# Patient Record
Sex: Female | Born: 1940 | Race: White | Hispanic: No | Smoking: Former smoker
Health system: Southern US, Community
[De-identification: ages and names within clinical notes are randomized; demographics above are authoritative.]

## PROBLEM LIST (undated history)

## (undated) DIAGNOSIS — E039 Hypothyroidism, unspecified: Secondary | ICD-10-CM

## (undated) DIAGNOSIS — F32A Depression, unspecified: Secondary | ICD-10-CM

## (undated) DIAGNOSIS — Z9981 Dependence on supplemental oxygen: Secondary | ICD-10-CM

## (undated) DIAGNOSIS — I428 Other cardiomyopathies: Secondary | ICD-10-CM

## (undated) DIAGNOSIS — I4891 Unspecified atrial fibrillation: Secondary | ICD-10-CM

## (undated) DIAGNOSIS — J449 Chronic obstructive pulmonary disease, unspecified: Secondary | ICD-10-CM

## (undated) DIAGNOSIS — E119 Type 2 diabetes mellitus without complications: Secondary | ICD-10-CM

## (undated) DIAGNOSIS — I509 Heart failure, unspecified: Secondary | ICD-10-CM

## (undated) DIAGNOSIS — IMO0001 Reserved for inherently not codable concepts without codable children: Secondary | ICD-10-CM

## (undated) DIAGNOSIS — I5032 Chronic diastolic (congestive) heart failure: Secondary | ICD-10-CM

## (undated) DIAGNOSIS — K5090 Crohn's disease, unspecified, without complications: Secondary | ICD-10-CM

## (undated) DIAGNOSIS — F329 Major depressive disorder, single episode, unspecified: Secondary | ICD-10-CM

## (undated) DIAGNOSIS — J302 Other seasonal allergic rhinitis: Secondary | ICD-10-CM

## (undated) DIAGNOSIS — I739 Peripheral vascular disease, unspecified: Secondary | ICD-10-CM

## (undated) DIAGNOSIS — I2510 Atherosclerotic heart disease of native coronary artery without angina pectoris: Secondary | ICD-10-CM

## (undated) DIAGNOSIS — Z950 Presence of cardiac pacemaker: Secondary | ICD-10-CM

## (undated) DIAGNOSIS — M109 Gout, unspecified: Secondary | ICD-10-CM

## (undated) DIAGNOSIS — G4733 Obstructive sleep apnea (adult) (pediatric): Secondary | ICD-10-CM

## (undated) DIAGNOSIS — I495 Sick sinus syndrome: Secondary | ICD-10-CM

## (undated) DIAGNOSIS — Z6841 Body Mass Index (BMI) 40.0 and over, adult: Secondary | ICD-10-CM

## (undated) DIAGNOSIS — K219 Gastro-esophageal reflux disease without esophagitis: Secondary | ICD-10-CM

## (undated) DIAGNOSIS — E785 Hyperlipidemia, unspecified: Secondary | ICD-10-CM

## (undated) DIAGNOSIS — I499 Cardiac arrhythmia, unspecified: Secondary | ICD-10-CM

## (undated) HISTORY — DX: Unspecified atrial fibrillation: I48.91

## (undated) HISTORY — PX: CATARACT EXTRACTION W/ INTRAOCULAR LENS  IMPLANT, BILATERAL: SHX1307

## (undated) HISTORY — DX: Body Mass Index (BMI) 40.0 and over, adult: Z684

## (undated) HISTORY — DX: Chronic obstructive pulmonary disease, unspecified: J44.9

## (undated) HISTORY — DX: Hypothyroidism, unspecified: E03.9

## (undated) HISTORY — DX: Other seasonal allergic rhinitis: J30.2

## (undated) HISTORY — DX: Morbid (severe) obesity due to excess calories: E66.01

## (undated) HISTORY — PX: TONSILLECTOMY: SUR1361

## (undated) HISTORY — DX: Obstructive sleep apnea (adult) (pediatric): G47.33

## (undated) HISTORY — DX: Sick sinus syndrome: I49.5

## (undated) HISTORY — DX: Gastro-esophageal reflux disease without esophagitis: K21.9

## (undated) HISTORY — DX: Hyperlipidemia, unspecified: E78.5

## (undated) HISTORY — DX: Type 2 diabetes mellitus without complications: E11.9

## (undated) HISTORY — DX: Major depressive disorder, single episode, unspecified: F32.9

## (undated) HISTORY — DX: Heart failure, unspecified: I50.9

## (undated) HISTORY — DX: Atherosclerotic heart disease of native coronary artery without angina pectoris: I25.10

## (undated) HISTORY — DX: Crohn's disease, unspecified, without complications: K50.90

## (undated) HISTORY — DX: Depression, unspecified: F32.A

## (undated) HISTORY — PX: OTHER SURGICAL HISTORY: SHX169

## (undated) HISTORY — DX: Other cardiomyopathies: I42.8

## (undated) HISTORY — DX: Presence of cardiac pacemaker: Z95.0

## (undated) HISTORY — DX: Chronic diastolic (congestive) heart failure: I50.32

---

## 1999-01-30 ENCOUNTER — Ambulatory Visit (HOSPITAL_COMMUNITY): Admission: RE | Admit: 1999-01-30 | Discharge: 1999-01-30 | Payer: Self-pay | Admitting: Gastroenterology

## 2000-04-30 ENCOUNTER — Ambulatory Visit (HOSPITAL_COMMUNITY): Admission: RE | Admit: 2000-04-30 | Discharge: 2000-04-30 | Payer: Self-pay | Admitting: *Deleted

## 2000-07-14 ENCOUNTER — Inpatient Hospital Stay (HOSPITAL_COMMUNITY): Admission: AD | Admit: 2000-07-14 | Discharge: 2000-07-17 | Payer: Self-pay | Admitting: *Deleted

## 2000-08-04 ENCOUNTER — Inpatient Hospital Stay (HOSPITAL_COMMUNITY): Admission: AD | Admit: 2000-08-04 | Discharge: 2000-08-06 | Payer: Self-pay | Admitting: *Deleted

## 2000-08-19 ENCOUNTER — Ambulatory Visit (HOSPITAL_COMMUNITY): Admission: RE | Admit: 2000-08-19 | Discharge: 2000-08-19 | Payer: Self-pay | Admitting: *Deleted

## 2000-08-30 HISTORY — PX: CARDIAC CATHETERIZATION: SHX172

## 2000-09-12 ENCOUNTER — Ambulatory Visit (HOSPITAL_COMMUNITY): Admission: RE | Admit: 2000-09-12 | Discharge: 2000-09-12 | Payer: Self-pay | Admitting: Cardiology

## 2000-09-12 ENCOUNTER — Encounter: Payer: Self-pay | Admitting: Cardiology

## 2000-09-15 ENCOUNTER — Ambulatory Visit (HOSPITAL_COMMUNITY): Admission: RE | Admit: 2000-09-15 | Discharge: 2000-09-17 | Payer: Self-pay | Admitting: Cardiology

## 2000-09-16 ENCOUNTER — Encounter: Payer: Self-pay | Admitting: Cardiology

## 2000-09-17 ENCOUNTER — Encounter: Payer: Self-pay | Admitting: Cardiology

## 2000-10-14 ENCOUNTER — Emergency Department (HOSPITAL_COMMUNITY): Admission: EM | Admit: 2000-10-14 | Discharge: 2000-10-14 | Payer: Self-pay | Admitting: Emergency Medicine

## 2001-01-29 ENCOUNTER — Ambulatory Visit: Admission: RE | Admit: 2001-01-29 | Discharge: 2001-01-29 | Payer: Self-pay | Admitting: Pulmonary Disease

## 2002-03-27 ENCOUNTER — Encounter: Payer: Self-pay | Admitting: Orthopedic Surgery

## 2002-03-27 ENCOUNTER — Ambulatory Visit (HOSPITAL_COMMUNITY): Admission: RE | Admit: 2002-03-27 | Discharge: 2002-03-27 | Payer: Self-pay | Admitting: Orthopedic Surgery

## 2002-04-12 ENCOUNTER — Encounter (INDEPENDENT_AMBULATORY_CARE_PROVIDER_SITE_OTHER): Payer: Self-pay | Admitting: Specialist

## 2002-04-12 ENCOUNTER — Ambulatory Visit (HOSPITAL_COMMUNITY): Admission: RE | Admit: 2002-04-12 | Discharge: 2002-04-12 | Payer: Self-pay | Admitting: Gastroenterology

## 2003-05-30 ENCOUNTER — Ambulatory Visit (HOSPITAL_COMMUNITY): Admission: RE | Admit: 2003-05-30 | Discharge: 2003-05-30 | Payer: Self-pay | Admitting: *Deleted

## 2003-05-30 ENCOUNTER — Encounter: Payer: Self-pay | Admitting: *Deleted

## 2003-08-07 ENCOUNTER — Inpatient Hospital Stay (HOSPITAL_COMMUNITY): Admission: AD | Admit: 2003-08-07 | Discharge: 2003-08-10 | Payer: Self-pay | Admitting: Internal Medicine

## 2005-02-18 ENCOUNTER — Ambulatory Visit: Payer: Self-pay | Admitting: Internal Medicine

## 2005-05-29 ENCOUNTER — Encounter (INDEPENDENT_AMBULATORY_CARE_PROVIDER_SITE_OTHER): Payer: Self-pay | Admitting: Specialist

## 2005-05-29 ENCOUNTER — Ambulatory Visit (HOSPITAL_COMMUNITY): Admission: RE | Admit: 2005-05-29 | Discharge: 2005-05-29 | Payer: Self-pay | Admitting: Gastroenterology

## 2006-08-06 ENCOUNTER — Ambulatory Visit: Payer: Self-pay | Admitting: Internal Medicine

## 2006-08-07 ENCOUNTER — Ambulatory Visit: Payer: Self-pay

## 2006-09-01 ENCOUNTER — Ambulatory Visit: Payer: Self-pay | Admitting: Internal Medicine

## 2006-09-19 ENCOUNTER — Ambulatory Visit: Payer: Self-pay | Admitting: Cardiovascular Disease

## 2006-09-26 ENCOUNTER — Ambulatory Visit: Payer: Self-pay | Admitting: Cardiology

## 2006-10-03 ENCOUNTER — Ambulatory Visit: Payer: Self-pay | Admitting: Cardiology

## 2006-10-06 ENCOUNTER — Ambulatory Visit: Payer: Self-pay | Admitting: Cardiovascular Disease

## 2006-10-08 ENCOUNTER — Ambulatory Visit: Payer: Self-pay

## 2006-10-08 ENCOUNTER — Ambulatory Visit: Payer: Self-pay | Admitting: Cardiology

## 2006-10-13 ENCOUNTER — Ambulatory Visit: Payer: Self-pay

## 2006-10-30 ENCOUNTER — Ambulatory Visit: Payer: Self-pay | Admitting: Cardiology

## 2006-11-27 ENCOUNTER — Ambulatory Visit: Payer: Self-pay | Admitting: *Deleted

## 2006-12-18 ENCOUNTER — Ambulatory Visit: Payer: Self-pay | Admitting: Cardiology

## 2007-01-15 ENCOUNTER — Ambulatory Visit: Payer: Self-pay | Admitting: Internal Medicine

## 2007-02-05 ENCOUNTER — Ambulatory Visit: Payer: Self-pay | Admitting: Cardiology

## 2007-02-09 ENCOUNTER — Ambulatory Visit: Payer: Self-pay | Admitting: Internal Medicine

## 2007-03-05 ENCOUNTER — Ambulatory Visit: Payer: Self-pay | Admitting: Cardiovascular Disease

## 2007-03-16 ENCOUNTER — Ambulatory Visit: Payer: Self-pay | Admitting: Internal Medicine

## 2007-03-16 LAB — CONVERTED CEMR LAB
Basophils Relative: 0 % (ref 0.0–1.0)
CO2: 30 meq/L (ref 19–32)
Creatinine, Ser: 0.9 mg/dL (ref 0.4–1.2)
Glucose, Bld: 115 mg/dL — ABNORMAL HIGH (ref 70–99)
HCT: 46.9 % — ABNORMAL HIGH (ref 36.0–46.0)
Hemoglobin: 16.1 g/dL — ABNORMAL HIGH (ref 12.0–15.0)
INR: 2.1 — ABNORMAL HIGH (ref 0.9–2.0)
Lymphocytes Relative: 14.9 % (ref 12.0–46.0)
Monocytes Absolute: 0.5 10*3/uL (ref 0.2–0.7)
Neutro Abs: 6.2 10*3/uL (ref 1.4–7.7)
Neutrophils Relative %: 77.7 % — ABNORMAL HIGH (ref 43.0–77.0)
Potassium: 4.2 meq/L (ref 3.5–5.1)
Prothrombin Time: 18.1 s — ABNORMAL HIGH (ref 10.0–14.0)
RDW: 13.6 % (ref 11.5–14.6)
Sodium: 141 meq/L (ref 135–145)
aPTT: 37.6 s — ABNORMAL HIGH (ref 26.5–36.5)

## 2007-03-26 ENCOUNTER — Ambulatory Visit (HOSPITAL_COMMUNITY): Admission: RE | Admit: 2007-03-26 | Discharge: 2007-03-26 | Payer: Self-pay | Admitting: Internal Medicine

## 2007-04-02 ENCOUNTER — Ambulatory Visit: Payer: Self-pay | Admitting: Internal Medicine

## 2007-04-20 ENCOUNTER — Ambulatory Visit: Payer: Self-pay | Admitting: Internal Medicine

## 2007-04-20 LAB — CONVERTED CEMR LAB
ALT: 29 units/L (ref 0–35)
AST: 19 units/L (ref 0–37)
Bilirubin, Direct: 0.1 mg/dL (ref 0.0–0.3)
Total Bilirubin: 0.9 mg/dL (ref 0.3–1.2)
Total Protein: 6.8 g/dL (ref 6.0–8.3)

## 2007-04-23 ENCOUNTER — Ambulatory Visit: Payer: Self-pay | Admitting: Cardiovascular Disease

## 2007-04-23 ENCOUNTER — Ambulatory Visit: Payer: Self-pay | Admitting: Cardiology

## 2007-04-24 ENCOUNTER — Ambulatory Visit: Admission: RE | Admit: 2007-04-24 | Discharge: 2007-04-24 | Payer: Self-pay | Admitting: Internal Medicine

## 2007-04-30 ENCOUNTER — Ambulatory Visit: Payer: Self-pay | Admitting: Cardiology

## 2007-05-07 ENCOUNTER — Ambulatory Visit: Payer: Self-pay | Admitting: Internal Medicine

## 2007-05-07 LAB — CONVERTED CEMR LAB
Basophils Absolute: 0 10*3/uL (ref 0.0–0.1)
Calcium: 10.1 mg/dL (ref 8.4–10.5)
Eosinophils Absolute: 0 10*3/uL (ref 0.0–0.6)
Eosinophils Relative: 0.3 % (ref 0.0–5.0)
GFR calc Af Amer: 71 mL/min
GFR calc non Af Amer: 59 mL/min
Glucose, Bld: 191 mg/dL — ABNORMAL HIGH (ref 70–99)
Lymphocytes Relative: 9.8 % — ABNORMAL LOW (ref 12.0–46.0)
MCHC: 33.9 g/dL (ref 30.0–36.0)
MCV: 96 fL (ref 78.0–100.0)
Neutro Abs: 6.5 10*3/uL (ref 1.4–7.7)
Platelets: 369 10*3/uL (ref 150–400)
Sodium: 139 meq/L (ref 135–145)
WBC: 7.8 10*3/uL (ref 4.5–10.5)

## 2007-05-08 ENCOUNTER — Ambulatory Visit: Payer: Self-pay | Admitting: Cardiology

## 2007-05-08 ENCOUNTER — Ambulatory Visit (HOSPITAL_COMMUNITY): Admission: RE | Admit: 2007-05-08 | Discharge: 2007-05-08 | Payer: Self-pay | Admitting: Internal Medicine

## 2007-05-15 ENCOUNTER — Ambulatory Visit: Payer: Self-pay | Admitting: Cardiology

## 2007-05-25 ENCOUNTER — Ambulatory Visit: Payer: Self-pay | Admitting: Cardiology

## 2007-05-25 ENCOUNTER — Ambulatory Visit: Payer: Self-pay | Admitting: Internal Medicine

## 2007-05-25 ENCOUNTER — Inpatient Hospital Stay (HOSPITAL_COMMUNITY): Admission: EM | Admit: 2007-05-25 | Discharge: 2007-06-06 | Payer: Self-pay | Admitting: Emergency Medicine

## 2007-05-26 ENCOUNTER — Encounter (INDEPENDENT_AMBULATORY_CARE_PROVIDER_SITE_OTHER): Payer: Self-pay | Admitting: Internal Medicine

## 2007-05-29 HISTORY — PX: CARDIAC CATHETERIZATION: SHX172

## 2007-06-08 ENCOUNTER — Ambulatory Visit: Payer: Self-pay | Admitting: Internal Medicine

## 2007-06-08 LAB — CONVERTED CEMR LAB
BUN: 57 mg/dL — ABNORMAL HIGH (ref 6–23)
CO2: 27 meq/L (ref 19–32)
GFR calc Af Amer: 34 mL/min
Glucose, Bld: 169 mg/dL — ABNORMAL HIGH (ref 70–99)
Potassium: 4.8 meq/L (ref 3.5–5.1)

## 2007-06-15 ENCOUNTER — Ambulatory Visit: Payer: Self-pay | Admitting: Internal Medicine

## 2007-06-15 LAB — CONVERTED CEMR LAB
GFR calc Af Amer: 53 mL/min
GFR calc non Af Amer: 44 mL/min
Glucose, Bld: 147 mg/dL — ABNORMAL HIGH (ref 70–99)
Sodium: 140 meq/L (ref 135–145)

## 2007-06-22 ENCOUNTER — Ambulatory Visit (HOSPITAL_BASED_OUTPATIENT_CLINIC_OR_DEPARTMENT_OTHER): Admission: RE | Admit: 2007-06-22 | Discharge: 2007-06-22 | Payer: Self-pay | Admitting: Internal Medicine

## 2007-06-29 ENCOUNTER — Ambulatory Visit: Payer: Self-pay | Admitting: Cardiovascular Disease

## 2007-07-08 ENCOUNTER — Ambulatory Visit: Payer: Self-pay | Admitting: Pulmonary Disease

## 2007-07-13 ENCOUNTER — Ambulatory Visit: Payer: Self-pay | Admitting: Internal Medicine

## 2007-07-13 LAB — CONVERTED CEMR LAB
Basophils Absolute: 0.1 10*3/uL (ref 0.0–0.1)
Calcium: 10.7 mg/dL — ABNORMAL HIGH (ref 8.4–10.5)
Chloride: 108 meq/L (ref 96–112)
Eosinophils Absolute: 0.1 10*3/uL (ref 0.0–0.6)
GFR calc non Af Amer: 59 mL/min
MCHC: 34.9 g/dL (ref 30.0–36.0)
MCV: 92.4 fL (ref 78.0–100.0)
Platelets: 317 10*3/uL (ref 150–400)
RBC: 4.78 M/uL (ref 3.87–5.11)
Sodium: 143 meq/L (ref 135–145)
aPTT: 38.7 s — ABNORMAL HIGH (ref 21.7–29.8)

## 2007-07-16 ENCOUNTER — Ambulatory Visit (HOSPITAL_COMMUNITY): Admission: RE | Admit: 2007-07-16 | Discharge: 2007-07-16 | Payer: Self-pay | Admitting: Internal Medicine

## 2007-07-16 ENCOUNTER — Ambulatory Visit: Payer: Self-pay | Admitting: Internal Medicine

## 2007-07-17 ENCOUNTER — Ambulatory Visit: Payer: Self-pay | Admitting: Pulmonary Disease

## 2007-07-23 ENCOUNTER — Ambulatory Visit: Payer: Self-pay | Admitting: Cardiology

## 2007-08-06 ENCOUNTER — Ambulatory Visit: Payer: Self-pay | Admitting: Cardiology

## 2007-08-10 ENCOUNTER — Ambulatory Visit: Payer: Self-pay | Admitting: Internal Medicine

## 2007-08-20 ENCOUNTER — Ambulatory Visit: Payer: Self-pay | Admitting: Cardiology

## 2007-09-10 ENCOUNTER — Ambulatory Visit: Payer: Self-pay | Admitting: Cardiology

## 2007-09-17 ENCOUNTER — Ambulatory Visit: Payer: Self-pay | Admitting: Cardiology

## 2007-10-14 ENCOUNTER — Ambulatory Visit: Payer: Self-pay | Admitting: Cardiovascular Disease

## 2007-10-28 ENCOUNTER — Ambulatory Visit: Payer: Self-pay | Admitting: Cardiology

## 2007-11-04 ENCOUNTER — Ambulatory Visit: Payer: Self-pay | Admitting: Internal Medicine

## 2007-11-12 ENCOUNTER — Ambulatory Visit: Payer: Self-pay | Admitting: Cardiology

## 2007-11-19 ENCOUNTER — Ambulatory Visit: Payer: Self-pay | Admitting: Cardiology

## 2007-12-04 ENCOUNTER — Ambulatory Visit: Payer: Self-pay | Admitting: Cardiology

## 2007-12-08 ENCOUNTER — Ambulatory Visit: Payer: Self-pay | Admitting: Internal Medicine

## 2007-12-08 LAB — CONVERTED CEMR LAB
Bilirubin Urine: NEGATIVE
Ketones, ur: NEGATIVE mg/dL
Leukocytes, UA: NEGATIVE
Total Protein, Urine: >=300 mg/dL — AB
Urine Glucose: NEGATIVE mg/dL
Urobilinogen, UA: 0.2 (ref 0.0–1.0)
pH: 5 (ref 5.0–8.0)

## 2007-12-10 ENCOUNTER — Ambulatory Visit: Payer: Self-pay | Admitting: Cardiology

## 2007-12-17 ENCOUNTER — Ambulatory Visit: Payer: Self-pay | Admitting: Cardiology

## 2007-12-18 DIAGNOSIS — I4891 Unspecified atrial fibrillation: Secondary | ICD-10-CM

## 2007-12-18 DIAGNOSIS — F411 Generalized anxiety disorder: Secondary | ICD-10-CM | POA: Insufficient documentation

## 2007-12-18 DIAGNOSIS — F329 Major depressive disorder, single episode, unspecified: Secondary | ICD-10-CM

## 2007-12-18 DIAGNOSIS — I10 Essential (primary) hypertension: Secondary | ICD-10-CM

## 2007-12-18 HISTORY — DX: Unspecified atrial fibrillation: I48.91

## 2007-12-21 ENCOUNTER — Ambulatory Visit: Payer: Self-pay | Admitting: Pulmonary Disease

## 2007-12-21 DIAGNOSIS — G4710 Hypersomnia, unspecified: Secondary | ICD-10-CM | POA: Insufficient documentation

## 2007-12-25 ENCOUNTER — Ambulatory Visit: Payer: Self-pay | Admitting: Cardiology

## 2007-12-26 ENCOUNTER — Encounter: Payer: Self-pay | Admitting: Pulmonary Disease

## 2007-12-26 ENCOUNTER — Ambulatory Visit (HOSPITAL_BASED_OUTPATIENT_CLINIC_OR_DEPARTMENT_OTHER): Admission: RE | Admit: 2007-12-26 | Discharge: 2007-12-26 | Payer: Self-pay | Admitting: Pulmonary Disease

## 2007-12-31 ENCOUNTER — Ambulatory Visit: Payer: Self-pay | Admitting: Internal Medicine

## 2008-01-02 DIAGNOSIS — J439 Emphysema, unspecified: Secondary | ICD-10-CM | POA: Insufficient documentation

## 2008-01-07 ENCOUNTER — Ambulatory Visit: Payer: Self-pay | Admitting: Internal Medicine

## 2008-01-13 ENCOUNTER — Telehealth (INDEPENDENT_AMBULATORY_CARE_PROVIDER_SITE_OTHER): Payer: Self-pay | Admitting: *Deleted

## 2008-01-15 ENCOUNTER — Ambulatory Visit: Payer: Self-pay | Admitting: Internal Medicine

## 2008-01-20 ENCOUNTER — Ambulatory Visit: Payer: Self-pay | Admitting: Pulmonary Disease

## 2008-01-20 DIAGNOSIS — G4733 Obstructive sleep apnea (adult) (pediatric): Secondary | ICD-10-CM

## 2008-01-21 ENCOUNTER — Ambulatory Visit: Payer: Self-pay | Admitting: Internal Medicine

## 2008-01-29 ENCOUNTER — Ambulatory Visit: Payer: Self-pay | Admitting: Internal Medicine

## 2008-02-12 ENCOUNTER — Ambulatory Visit: Payer: Self-pay | Admitting: Cardiology

## 2008-02-18 ENCOUNTER — Ambulatory Visit: Payer: Self-pay | Admitting: Internal Medicine

## 2008-03-03 ENCOUNTER — Ambulatory Visit: Payer: Self-pay | Admitting: Internal Medicine

## 2008-03-10 ENCOUNTER — Ambulatory Visit: Payer: Self-pay | Admitting: Cardiology

## 2008-03-17 ENCOUNTER — Ambulatory Visit: Payer: Self-pay | Admitting: Cardiology

## 2008-03-24 ENCOUNTER — Ambulatory Visit: Payer: Self-pay | Admitting: Cardiology

## 2008-03-31 ENCOUNTER — Ambulatory Visit: Payer: Self-pay | Admitting: Internal Medicine

## 2008-03-31 ENCOUNTER — Ambulatory Visit: Payer: Self-pay | Admitting: Cardiology

## 2008-04-07 ENCOUNTER — Ambulatory Visit: Payer: Self-pay | Admitting: Cardiology

## 2008-04-14 ENCOUNTER — Ambulatory Visit: Payer: Self-pay | Admitting: Cardiovascular Disease

## 2008-04-21 ENCOUNTER — Ambulatory Visit: Payer: Self-pay | Admitting: Internal Medicine

## 2008-04-25 ENCOUNTER — Inpatient Hospital Stay (HOSPITAL_COMMUNITY): Admission: EM | Admit: 2008-04-25 | Discharge: 2008-04-30 | Payer: Self-pay | Admitting: Emergency Medicine

## 2008-04-25 ENCOUNTER — Ambulatory Visit: Payer: Self-pay | Admitting: Cardiology

## 2008-04-27 ENCOUNTER — Encounter: Payer: Self-pay | Admitting: Internal Medicine

## 2008-05-02 ENCOUNTER — Ambulatory Visit: Payer: Self-pay | Admitting: Internal Medicine

## 2008-05-02 LAB — CONVERTED CEMR LAB
BUN: 37 mg/dL — ABNORMAL HIGH (ref 6–23)
CO2: 30 meq/L (ref 19–32)
Calcium: 10.4 mg/dL (ref 8.4–10.5)
Creatinine, Ser: 1.3 mg/dL — ABNORMAL HIGH (ref 0.4–1.2)
Glucose, Bld: 218 mg/dL — ABNORMAL HIGH (ref 70–99)
INR: 3 — ABNORMAL HIGH (ref 0.8–1.0)

## 2008-05-12 HISTORY — PX: OTHER SURGICAL HISTORY: SHX169

## 2008-05-17 ENCOUNTER — Ambulatory Visit: Payer: Self-pay | Admitting: Internal Medicine

## 2008-05-17 ENCOUNTER — Ambulatory Visit: Payer: Self-pay | Admitting: Cardiology

## 2008-05-17 ENCOUNTER — Encounter: Payer: Self-pay | Admitting: Internal Medicine

## 2008-05-17 ENCOUNTER — Inpatient Hospital Stay (HOSPITAL_COMMUNITY): Admission: AD | Admit: 2008-05-17 | Discharge: 2008-05-20 | Payer: Self-pay | Admitting: Internal Medicine

## 2008-05-27 ENCOUNTER — Ambulatory Visit: Payer: Self-pay | Admitting: Cardiology

## 2008-06-01 ENCOUNTER — Ambulatory Visit: Payer: Self-pay | Admitting: Internal Medicine

## 2008-06-01 LAB — CONVERTED CEMR LAB
Basophils Absolute: 0 10*3/uL (ref 0.0–0.1)
Basophils Relative: 0.1 % (ref 0.0–3.0)
Calcium: 10.5 mg/dL (ref 8.4–10.5)
Creatinine, Ser: 1.3 mg/dL — ABNORMAL HIGH (ref 0.4–1.2)
GFR calc Af Amer: 53 mL/min
HCT: 41.4 % (ref 36.0–46.0)
Hemoglobin: 14.3 g/dL (ref 12.0–15.0)
MCHC: 34.4 g/dL (ref 30.0–36.0)
MCV: 99.4 fL (ref 78.0–100.0)
Monocytes Absolute: 1 10*3/uL (ref 0.1–1.0)
Neutro Abs: 7.3 10*3/uL (ref 1.4–7.7)
RBC: 4.16 M/uL (ref 3.87–5.11)
RDW: 14.3 % (ref 11.5–14.6)
Sodium: 137 meq/L (ref 135–145)
aPTT: 54.3 s — ABNORMAL HIGH (ref 21.7–29.8)

## 2008-06-10 ENCOUNTER — Ambulatory Visit (HOSPITAL_COMMUNITY): Admission: RE | Admit: 2008-06-10 | Discharge: 2008-06-11 | Payer: Self-pay | Admitting: Internal Medicine

## 2008-06-10 ENCOUNTER — Ambulatory Visit: Payer: Self-pay | Admitting: Internal Medicine

## 2008-06-27 ENCOUNTER — Ambulatory Visit: Payer: Self-pay | Admitting: Internal Medicine

## 2008-06-27 ENCOUNTER — Ambulatory Visit: Payer: Self-pay | Admitting: Cardiovascular Disease

## 2008-06-27 LAB — CONVERTED CEMR LAB
ALT: 20 units/L (ref 0–35)
AST: 21 units/L (ref 0–37)
Albumin: 3.8 g/dL (ref 3.5–5.2)
TSH: 2.4 microintl units/mL (ref 0.35–5.50)
Total Bilirubin: 0.8 mg/dL (ref 0.3–1.2)

## 2008-07-05 ENCOUNTER — Ambulatory Visit: Payer: Self-pay | Admitting: Internal Medicine

## 2008-07-05 LAB — CONVERTED CEMR LAB
Basophils Absolute: 0 10*3/uL (ref 0.0–0.1)
Basophils Relative: 0.3 % (ref 0.0–3.0)
CO2: 28 meq/L (ref 19–32)
Chloride: 103 meq/L (ref 96–112)
Eosinophils Absolute: 0.1 10*3/uL (ref 0.0–0.7)
GFR calc non Af Amer: 40 mL/min
Lymphocytes Relative: 21 % (ref 12.0–46.0)
MCHC: 34.6 g/dL (ref 30.0–36.0)
MCV: 99.6 fL (ref 78.0–100.0)
Neutrophils Relative %: 68.3 % (ref 43.0–77.0)
Platelets: 370 10*3/uL (ref 150–400)
Potassium: 3.9 meq/L (ref 3.5–5.1)
Prothrombin Time: 28.9 s — ABNORMAL HIGH (ref 10.9–13.3)
RBC: 4.49 M/uL (ref 3.87–5.11)
Sodium: 139 meq/L (ref 135–145)
WBC: 6.4 10*3/uL (ref 4.5–10.5)
aPTT: 52.5 s — ABNORMAL HIGH (ref 21.7–29.8)

## 2008-07-11 ENCOUNTER — Ambulatory Visit: Payer: Self-pay | Admitting: Cardiology

## 2008-07-11 ENCOUNTER — Ambulatory Visit: Payer: Self-pay | Admitting: Internal Medicine

## 2008-07-14 ENCOUNTER — Ambulatory Visit: Payer: Self-pay | Admitting: Internal Medicine

## 2008-07-14 ENCOUNTER — Ambulatory Visit (HOSPITAL_COMMUNITY): Admission: RE | Admit: 2008-07-14 | Discharge: 2008-07-14 | Payer: Self-pay | Admitting: Internal Medicine

## 2008-07-20 ENCOUNTER — Ambulatory Visit: Payer: Self-pay | Admitting: Cardiology

## 2008-07-27 ENCOUNTER — Ambulatory Visit: Payer: Self-pay | Admitting: Cardiology

## 2008-08-11 ENCOUNTER — Ambulatory Visit: Payer: Self-pay | Admitting: Cardiology

## 2008-08-12 ENCOUNTER — Ambulatory Visit: Payer: Self-pay | Admitting: Internal Medicine

## 2008-09-02 ENCOUNTER — Emergency Department (HOSPITAL_COMMUNITY): Admission: EM | Admit: 2008-09-02 | Discharge: 2008-09-02 | Payer: Self-pay | Admitting: Emergency Medicine

## 2008-09-05 DIAGNOSIS — E785 Hyperlipidemia, unspecified: Secondary | ICD-10-CM | POA: Insufficient documentation

## 2008-09-05 DIAGNOSIS — Z8719 Personal history of other diseases of the digestive system: Secondary | ICD-10-CM | POA: Insufficient documentation

## 2008-09-05 DIAGNOSIS — M818 Other osteoporosis without current pathological fracture: Secondary | ICD-10-CM

## 2008-09-12 ENCOUNTER — Ambulatory Visit: Payer: Self-pay | Admitting: Cardiology

## 2008-09-19 ENCOUNTER — Ambulatory Visit: Payer: Self-pay | Admitting: Internal Medicine

## 2008-09-19 ENCOUNTER — Ambulatory Visit: Payer: Self-pay | Admitting: Cardiology

## 2008-09-19 LAB — CONVERTED CEMR LAB
CO2: 28 meq/L (ref 19–32)
Chloride: 105 meq/L (ref 96–112)
Creatinine, Ser: 1.3 mg/dL — ABNORMAL HIGH (ref 0.4–1.2)
GFR calc non Af Amer: 43 mL/min
Potassium: 3.8 meq/L (ref 3.5–5.1)
Sodium: 139 meq/L (ref 135–145)

## 2008-09-29 ENCOUNTER — Ambulatory Visit: Payer: Self-pay | Admitting: Internal Medicine

## 2008-09-30 HISTORY — PX: PACEMAKER INSERTION: SHX728

## 2008-10-04 ENCOUNTER — Ambulatory Visit: Payer: Self-pay | Admitting: Internal Medicine

## 2008-10-04 LAB — CONVERTED CEMR LAB
CO2: 27 meq/L (ref 19–32)
Calcium: 10.3 mg/dL (ref 8.4–10.5)
Chloride: 106 meq/L (ref 96–112)
Glucose, Bld: 136 mg/dL — ABNORMAL HIGH (ref 70–99)
Potassium: 4.4 meq/L (ref 3.5–5.1)
Sodium: 140 meq/L (ref 135–145)

## 2008-10-13 ENCOUNTER — Ambulatory Visit: Payer: Self-pay | Admitting: Cardiovascular Disease

## 2008-10-20 ENCOUNTER — Ambulatory Visit: Payer: Self-pay | Admitting: Cardiology

## 2008-11-03 ENCOUNTER — Ambulatory Visit: Payer: Self-pay | Admitting: Internal Medicine

## 2008-11-09 ENCOUNTER — Encounter: Payer: Self-pay | Admitting: Internal Medicine

## 2008-11-17 ENCOUNTER — Ambulatory Visit: Payer: Self-pay | Admitting: Internal Medicine

## 2008-11-25 ENCOUNTER — Ambulatory Visit: Payer: Self-pay | Admitting: Internal Medicine

## 2008-11-29 ENCOUNTER — Ambulatory Visit: Payer: Self-pay | Admitting: Pulmonary Disease

## 2008-12-15 ENCOUNTER — Ambulatory Visit: Payer: Self-pay | Admitting: Internal Medicine

## 2008-12-22 ENCOUNTER — Ambulatory Visit: Payer: Self-pay | Admitting: Internal Medicine

## 2008-12-22 ENCOUNTER — Encounter: Payer: Self-pay | Admitting: Internal Medicine

## 2008-12-22 DIAGNOSIS — I5032 Chronic diastolic (congestive) heart failure: Secondary | ICD-10-CM

## 2009-01-05 ENCOUNTER — Ambulatory Visit: Payer: Self-pay | Admitting: Internal Medicine

## 2009-01-10 ENCOUNTER — Telehealth: Payer: Self-pay | Admitting: Internal Medicine

## 2009-01-20 ENCOUNTER — Ambulatory Visit: Payer: Self-pay | Admitting: Internal Medicine

## 2009-01-20 ENCOUNTER — Encounter: Payer: Self-pay | Admitting: Internal Medicine

## 2009-01-23 LAB — CONVERTED CEMR LAB
BUN: 20 mg/dL (ref 6–23)
Chloride: 97 meq/L (ref 96–112)
GFR calc non Af Amer: 43.22 mL/min (ref >60)
Glucose, Bld: 367 mg/dL — ABNORMAL HIGH (ref 70–99)
Potassium: 4.1 meq/L (ref 3.5–5.1)
Sodium: 137 meq/L (ref 135–145)

## 2009-01-26 ENCOUNTER — Ambulatory Visit: Payer: Self-pay | Admitting: Internal Medicine

## 2009-01-30 ENCOUNTER — Ambulatory Visit: Payer: Self-pay | Admitting: Cardiology

## 2009-02-16 ENCOUNTER — Ambulatory Visit: Payer: Self-pay | Admitting: Internal Medicine

## 2009-02-28 ENCOUNTER — Encounter: Payer: Self-pay | Admitting: *Deleted

## 2009-03-01 ENCOUNTER — Telehealth (INDEPENDENT_AMBULATORY_CARE_PROVIDER_SITE_OTHER): Payer: Self-pay | Admitting: *Deleted

## 2009-03-06 ENCOUNTER — Ambulatory Visit: Payer: Self-pay | Admitting: Cardiovascular Disease

## 2009-03-06 ENCOUNTER — Ambulatory Visit: Payer: Self-pay | Admitting: Internal Medicine

## 2009-03-06 LAB — CONVERTED CEMR LAB
POC INR: 3.1
Protime: 21.4

## 2009-03-07 ENCOUNTER — Encounter: Payer: Self-pay | Admitting: Internal Medicine

## 2009-03-09 ENCOUNTER — Ambulatory Visit: Payer: Self-pay | Admitting: Internal Medicine

## 2009-03-10 LAB — CONVERTED CEMR LAB
Basophils Relative: 0.9 % (ref 0.0–3.0)
Calcium: 10.2 mg/dL (ref 8.4–10.5)
Chloride: 102 meq/L (ref 96–112)
Creatinine, Ser: 1.2 mg/dL (ref 0.4–1.2)
Eosinophils Relative: 1.2 % (ref 0.0–5.0)
Hemoglobin: 15.4 g/dL — ABNORMAL HIGH (ref 12.0–15.0)
INR: 2.4 — ABNORMAL HIGH (ref 0.8–1.0)
Lymphocytes Relative: 21 % (ref 12.0–46.0)
MCV: 99.7 fL (ref 78.0–100.0)
Neutro Abs: 4.8 10*3/uL (ref 1.4–7.7)
Neutrophils Relative %: 66.5 % (ref 43.0–77.0)
RBC: 4.54 M/uL (ref 3.87–5.11)
Sodium: 138 meq/L (ref 135–145)
WBC: 7.4 10*3/uL (ref 4.5–10.5)

## 2009-03-13 ENCOUNTER — Inpatient Hospital Stay (HOSPITAL_COMMUNITY): Admission: RE | Admit: 2009-03-13 | Discharge: 2009-03-14 | Payer: Self-pay | Admitting: Internal Medicine

## 2009-03-13 ENCOUNTER — Ambulatory Visit: Payer: Self-pay | Admitting: Internal Medicine

## 2009-03-20 ENCOUNTER — Encounter: Payer: Self-pay | Admitting: Internal Medicine

## 2009-03-29 ENCOUNTER — Ambulatory Visit: Payer: Self-pay

## 2009-03-29 ENCOUNTER — Encounter: Payer: Self-pay | Admitting: Internal Medicine

## 2009-04-04 ENCOUNTER — Ambulatory Visit: Payer: Self-pay | Admitting: Cardiology

## 2009-04-04 LAB — CONVERTED CEMR LAB
POC INR: 1.7
Prothrombin Time: 16.1 s

## 2009-04-05 ENCOUNTER — Encounter: Payer: Self-pay | Admitting: *Deleted

## 2009-04-18 ENCOUNTER — Ambulatory Visit: Payer: Self-pay | Admitting: Cardiology

## 2009-04-18 LAB — CONVERTED CEMR LAB
POC INR: 1.3
Prothrombin Time: 14.1 s

## 2009-05-02 ENCOUNTER — Ambulatory Visit: Payer: Self-pay | Admitting: Cardiology

## 2009-05-16 ENCOUNTER — Ambulatory Visit: Payer: Self-pay | Admitting: Cardiology

## 2009-05-30 ENCOUNTER — Ambulatory Visit: Payer: Self-pay | Admitting: Cardiology

## 2009-06-13 ENCOUNTER — Ambulatory Visit: Payer: Self-pay | Admitting: Cardiology

## 2009-06-27 ENCOUNTER — Ambulatory Visit: Payer: Self-pay | Admitting: Cardiology

## 2009-07-11 ENCOUNTER — Ambulatory Visit: Payer: Self-pay | Admitting: Cardiology

## 2009-07-11 ENCOUNTER — Ambulatory Visit: Payer: Self-pay | Admitting: Internal Medicine

## 2009-07-11 LAB — CONVERTED CEMR LAB: POC INR: 2.8

## 2009-08-01 ENCOUNTER — Ambulatory Visit: Payer: Self-pay | Admitting: Internal Medicine

## 2009-08-01 LAB — CONVERTED CEMR LAB: POC INR: 3.3

## 2009-08-29 ENCOUNTER — Ambulatory Visit: Payer: Self-pay | Admitting: Cardiology

## 2009-08-29 LAB — CONVERTED CEMR LAB: POC INR: 4

## 2009-09-20 ENCOUNTER — Ambulatory Visit: Payer: Self-pay | Admitting: Internal Medicine

## 2009-09-20 LAB — CONVERTED CEMR LAB: POC INR: 4.2

## 2009-10-11 ENCOUNTER — Ambulatory Visit: Payer: Self-pay | Admitting: Internal Medicine

## 2009-10-11 LAB — CONVERTED CEMR LAB: POC INR: 3.3

## 2009-11-03 ENCOUNTER — Encounter (INDEPENDENT_AMBULATORY_CARE_PROVIDER_SITE_OTHER): Payer: Self-pay | Admitting: Cardiology

## 2009-11-03 ENCOUNTER — Ambulatory Visit: Payer: Self-pay | Admitting: Internal Medicine

## 2009-11-03 LAB — CONVERTED CEMR LAB: POC INR: 3.7

## 2009-12-01 ENCOUNTER — Encounter (INDEPENDENT_AMBULATORY_CARE_PROVIDER_SITE_OTHER): Payer: Self-pay | Admitting: Cardiology

## 2009-12-01 ENCOUNTER — Ambulatory Visit: Payer: Self-pay | Admitting: Cardiology

## 2009-12-01 LAB — CONVERTED CEMR LAB: POC INR: 3

## 2010-01-01 ENCOUNTER — Ambulatory Visit: Payer: Self-pay | Admitting: Cardiovascular Disease

## 2010-01-01 LAB — CONVERTED CEMR LAB: POC INR: 3.9

## 2010-01-15 DIAGNOSIS — Z950 Presence of cardiac pacemaker: Secondary | ICD-10-CM

## 2010-01-16 ENCOUNTER — Ambulatory Visit: Payer: Self-pay | Admitting: Internal Medicine

## 2010-01-22 ENCOUNTER — Ambulatory Visit: Payer: Self-pay | Admitting: Cardiovascular Disease

## 2010-01-22 LAB — CONVERTED CEMR LAB: POC INR: 3.8

## 2010-02-14 ENCOUNTER — Ambulatory Visit: Payer: Self-pay | Admitting: Internal Medicine

## 2010-03-05 ENCOUNTER — Ambulatory Visit: Payer: Self-pay | Admitting: Cardiology

## 2010-04-04 ENCOUNTER — Ambulatory Visit: Payer: Self-pay | Admitting: Internal Medicine

## 2010-04-04 LAB — CONVERTED CEMR LAB: POC INR: 2.9

## 2010-05-04 ENCOUNTER — Ambulatory Visit: Payer: Self-pay | Admitting: Cardiovascular Disease

## 2010-05-14 ENCOUNTER — Ambulatory Visit: Payer: Self-pay | Admitting: Cardiology

## 2010-05-23 ENCOUNTER — Encounter: Admission: RE | Admit: 2010-05-23 | Discharge: 2010-05-23 | Payer: Self-pay | Admitting: Family Medicine

## 2010-05-25 ENCOUNTER — Ambulatory Visit: Payer: Self-pay | Admitting: Cardiology

## 2010-06-11 ENCOUNTER — Ambulatory Visit: Payer: Self-pay | Admitting: Internal Medicine

## 2010-06-11 LAB — CONVERTED CEMR LAB: POC INR: 3.1

## 2010-07-03 ENCOUNTER — Ambulatory Visit: Payer: Self-pay | Admitting: Cardiology

## 2010-07-03 LAB — CONVERTED CEMR LAB: POC INR: 4.1

## 2010-07-10 ENCOUNTER — Ambulatory Visit: Payer: Self-pay | Admitting: Internal Medicine

## 2010-07-20 ENCOUNTER — Ambulatory Visit: Payer: Self-pay

## 2010-07-20 ENCOUNTER — Encounter: Payer: Self-pay | Admitting: Internal Medicine

## 2010-07-20 ENCOUNTER — Ambulatory Visit (HOSPITAL_COMMUNITY): Admission: RE | Admit: 2010-07-20 | Discharge: 2010-07-20 | Payer: Self-pay | Admitting: Internal Medicine

## 2010-07-20 ENCOUNTER — Ambulatory Visit: Payer: Self-pay | Admitting: Cardiovascular Disease

## 2010-07-24 ENCOUNTER — Ambulatory Visit: Payer: Self-pay | Admitting: Cardiology

## 2010-07-24 LAB — CONVERTED CEMR LAB: POC INR: 2.8

## 2010-08-17 ENCOUNTER — Ambulatory Visit: Payer: Self-pay | Admitting: Cardiology

## 2010-08-31 ENCOUNTER — Ambulatory Visit: Payer: Self-pay | Admitting: Cardiovascular Disease

## 2010-09-17 ENCOUNTER — Ambulatory Visit: Payer: Self-pay | Admitting: Cardiology

## 2010-09-21 LAB — CONVERTED CEMR LAB: POC INR: 3.1

## 2010-10-09 ENCOUNTER — Other Ambulatory Visit
Admission: RE | Admit: 2010-10-09 | Discharge: 2010-10-09 | Payer: Self-pay | Source: Home / Self Care | Admitting: Family Medicine

## 2010-10-10 ENCOUNTER — Ambulatory Visit: Admission: RE | Admit: 2010-10-10 | Discharge: 2010-10-10 | Payer: Self-pay | Source: Home / Self Care

## 2010-10-10 LAB — CONVERTED CEMR LAB: POC INR: 2.6

## 2010-11-01 NOTE — Assessment & Plan Note (Signed)
Summary: device/also need echo same day/saf   Visit Type:  Follow-up Referring Provider:  Graciela Husbands Primary Provider:  Deri Fuelling   History of Present Illness: [name] is seen in followup for atrial fibrillation with a somewhat rapid ventricular response. she is status post Pulm Vein isolation  at [facility] 2009. We have been using beta blockers to control the rate with good success of late.She has continued to do marvelously with ongoing 30 pound weight loss over the last 6 months now 60 pounds over the last year. This has been associated with a significant improvement in her exercise tolerance fatigue and shortness of breath.  She has minimal peripheral edema.  Current Medications (verified): 1)  Advair Diskus 500-50 Mcg/dose  Misc (Fluticasone-Salmeterol) .... Inhale 1 Puff Two Times A Day 2)  Furosemide 40 Mg Tabs (Furosemide) .Marland Kitchen.. 1 Tab Morning 2 Tabs Eve 3)  Lipitor 80 Mg Tabs (Atorvastatin Calcium) .Marland Kitchen.. 1 Tab in The Evening 4)  Coumadin 5 Mg  Tabs (Warfarin Sodium) .... Take As Directed By Coumadin Clinic 5)  Synthroid 25 Mcg  Tabs (Levothyroxine Sodium) .... Take 1 Tablet By Mouth Once A Day 6)  Carvedilol 12.5 Mg Tabs (Carvedilol) .... One Half By Mouth Two Times A Day 7)  Prilosec Otc 20 Mg Tbec (Omeprazole Magnesium) .... Once Daily 8)  Vitamin D 2000 Unit Tabs (Cholecalciferol) .... Once Daily 9)  Fosamax 70 Mg Tabs (Alendronate Sodium) .... Take One By Mouth Every Week 10)  Klor-Con 20 Meq Pack (Potassium Chloride) .... One By Mouth Daily 11)  Digoxin 0.25 Mg Tabs (Digoxin) .... Take One Tablet By Mouth Daily 12)  Metoprolol Tartrate 50 Mg Tabs (Metoprolol Tartrate) .... Take 1 Tablet Two Times A Day 13)  Allopurinol 100 Mg Tabs (Allopurinol) .... Take One Tablet By Mouth Daily 14)  Sulfasalazine 500 Mg Tabs (Sulfasalazine) .... Two Tablets Two Times A Day 15)  Cozaar 25 Mg Tabs (Losartan Potassium) .... Take One Tab At Bedtime 16)  Lantus 100 Unit/ml Soln  (Insulin Glargine) .... 34 Units Sub-Q Every Am 17)  Novolog Flexpen 100 Unit/ml Soln (Insulin Aspart) .Marland Kitchen.. 10 Units Sub-Q  Before Meals or 2 To 3 Times Daily 18)  Cvs Allergy Relief 10 Mg Tbdp (Loratadine) .... As Needed  Allergies (verified): No Known Drug Allergies  Past History:  Past Medical History: Last updated: 01/15/2010 Current Problems:       Atrial  Fibrillation refractory to Tikosyn, Amiodarone, Flecainide, Rythmol  1. History of nonischemic cardiomyopathy, ejection fraction of 20-25%       at left heart catheterization in September 2008.   2. History of decompensated congestive heart failure with       hospitalizations in August 2008 and July 2009.   3. Morbid obesity.   4. Hyperlipidemia.   6. Tachybrady syndrome status post implant of a Medtronic dual-chamber       pacemaker in 2001. -  explanted 2010--Medtronic ADAPTA L dual-chamber pacemaker 2010  7. Chronic obstructive pulmonary disease.   8. Osteoporosis.   9. Depression.   10.Obstructive sleep apnea/continuous positive airway pressure.   11.Gastroesophageal reflux disease.   12.Crohn's disease   13.Treated hypothyroidism.   14.PULMONARY VEIN ISOLATION PROCEDURE May 12, 2008   l5.Latest DCCV (on Amiodarone 800 mg daily) 06/28/08   16.Seasonal Allergies  Vital Signs:  Patient profile:   70 year old female Height:      71 inches Weight:      232 pounds Pulse rate:   75 / minute  BP sitting:   120 / 70  (left arm)  Vitals Entered By: Laurance Flatten CMA (July 10, 2010 12:04 PM)  Physical Exam  General:  The patient was alert and oriented in no acute distress. HEENT Normal.  Neck veins were flat, carotids were brisk.  Lungs were clear.  Heart sounds were regular without murmurs or gallops.  Abdomen was soft with active bowel sounds. There is no clubbing cyanosis or edema. Skin Warm and dry Her hair is blue   PPM Specifications Following MD:  Sherryl Manges, MD     PPM Vendor:  Medtronic     PPM  Model Number:  [id number]     PPM Serial Number:  [id number] PPM DOI:  03/13/2009     PPM Implanting MD:  Sherryl Manges, MD  Lead 1    Location: RA     DOI: 09/15/2000     Model #: [id number]     Serial #: [id number]     Status: capped Lead 2    Location: RA     DOI: 09/15/2000     Model #: [id number]     Serial #: [id number]     Status: active Lead 3    Location: RV     DOI: 03/13/2009     Model #: [id number]     Serial #: [id number]     Status: active  Magnet Response Rate:  BOL 85 ERI  65  Indications:  Tachy-brady syndrome  Explantation Comments:  03/13/2009  Kappa 401 explanted  PPM Follow Up Battery Voltage:  2.80 V     Battery Est. Longevity:  14 yrs     Pacer Dependent:  No     Right Ventricle  Amplitude: 15.68 mV, Impedance: 725 ohms, Threshold: 1.00 V at 0.40 msec  Episodes MS Episodes:  0     Coumadin:  Yes Ventricular High Rate:  56     Ventricular Pacing:  74.4%  Parameters Mode:  VVIR     Lower Rate Limit:  60     Upper Rate Limit:  130 Paced AV Delay:  230     Sensed AV Delay:  160 Next Cardiology Appt Due:  12/31/2010 Tech Comments:  56 VHR EPISODES--LONGEST WAS 47 SECONDS.  NORMAL DEVICE FUNCTION.  CHANGED RV OUTPUT FROM 2.250 TO 2.50 V.  PT PREFERS OV RATHER THAN CARELINK.  ROV IN 6 MTHS W/DEVICE CLINIC. Vella Kohler  July 10, 2010 12:12 PM  Impression & Recommendations:  Problem # 1:  DIASTOLIC HEART FAILURE, CHRONIC (ICD-428.32)  we will plan to reassess her left ventricular function by echo. In the event that it is unchanged we'll discontinue her digoxin Her updated medication list for this problem includes:    Furosemide 40 Mg Tabs (Furosemide) .Marland Kitchen... 1 tab morning 2 tabs eve    Coumadin 5 Mg Tabs (Warfarin sodium) .Marland Kitchen... Take as directed by coumadin clinic    Carvedilol 12.5 Mg Tabs (Carvedilol) ..... One half by mouth two times a day    Digoxin 0.25 Mg Tabs (Digoxin) .Marland Kitchen... Take one tablet by mouth daily    Metoprolol Tartrate 50 Mg Tabs (Metoprolol tartrate) .Marland Kitchen... Take 1  tablet two times a day    Cozaar 25 Mg Tabs (Losartan potassium) .Marland Kitchen... Take one tab at bedtime  Orders: Echocardiogram (Echo)  Problem # 2:  CARDIAC PACEMAKER IN SITU-MEDTRONIC ADAPTA L (ICD-V45.01) Device parameters and data were reviewed and no changes were made  Problem # 3:  FIBRILLATION, ATRIAL (ICD-427.31)  permanent Her updated  medication list for this problem includes:    Coumadin 5 Mg Tabs (Warfarin sodium) .Marland Kitchen... Take as directed by coumadin clinic    Carvedilol 12.5 Mg Tabs (Carvedilol) ..... One half by mouth two times a day    Digoxin 0.25 Mg Tabs (Digoxin) .Marland Kitchen... Take one tablet by mouth daily    Metoprolol Tartrate 50 Mg Tabs (Metoprolol tartrate) .Marland Kitchen... Take 1 tablet two times a day  Orders: Echocardiogram (Echo)  Problem # 4:  HYPERTENSION (ICD-401.9) well-controlled on current meds  Patient Instructions: 1)  Your physician recommends that you continue on your current medications as directed. Please refer to the Current Medication list given to you today. 2)  Your physician wants you to follow-up in: 6 months with device clinic.  You will receive a reminder letter in the mail two months in advance. If you don't receive a letter, please call our office to schedule the follow-up appointment. 3)  Your physician has requested that you have an echocardiogram.  Echocardiography is a painless test that uses sound waves to create images of your heart. It provides your doctor with information about the size and shape of your heart and how well your heart's chambers and valves are working.  This procedure takes approximately one hour. There are no restrictions for this procedure.

## 2010-11-01 NOTE — Letter (Signed)
Summary: Handout Printed  Printed Handout:  - Coumadin Instructions-w/out Meds 

## 2010-11-01 NOTE — Medication Information (Signed)
Summary: [name]  Anticoagulant Therapy  Managed by: Bethena Midget, RN, BSN Referring MD: Charlton Haws MD PCP: Deri Fuelling Supervising MD: Gala Romney MD, Reuel Boom Indication 1: Atrial Fibrillation (ICD-427.31) Lab Used: [facility] Site: [facility] INR POC 2.8 INR RANGE 3 - 3.5  Dietary changes: no    Health status changes: no    Bleeding/hemorrhagic complications: no    Recent/future hospitalizations: no    Any changes in medication regimen? no    Recent/future dental: no  Any missed doses?: no       Is patient compliant with meds? yes       Allergies: No Known Drug Allergies  Anticoagulation Management History:      The patient is taking warfarin and comes in today for a routine follow up visit.  Positive risk factors for bleeding include an age of 71 years or older.  The bleeding index is 'intermediate risk'.  Positive CHADS2 values include History of CHF and History of HTN.  Negative CHADS2 values include Age > 71 years old.  The start date was 08/06/2006.  Her last INR was 2.4 ratio.  Anticoagulation responsible provider: Bensimhon MD, Reuel Boom.  INR POC: 2.8.  Cuvette Lot#: [id number].  Exp: 05/2011.    Anticoagulation Management Assessment/Plan:      The patient's current anticoagulation dose is Coumadin 5 mg  tabs: take as directed by coumadin clinic.  The target INR is 3 - 3.5.  The next INR is due 03/02/2010.  Anticoagulation instructions were given to patient.  Results were reviewed/authorized by Bethena Midget, RN, BSN.  She was notified by Bethena Midget, RN, BSN.         Prior Anticoagulation Instructions: INR 3.8  No coumadin today. Then take 1.5 tablets daily, except take 2 tablets on Saturdays.   Next appointment on May 18th at 2:15 pm.   Current Anticoagulation Instructions: INR 2.8 Continue 7.5mg  everyday except 10mg  on Tuesdays and Saturdays. Recheck in 3 weeks.

## 2010-11-01 NOTE — Medication Information (Signed)
Summary: rov/ewj  Anticoagulant Therapy  Managed by: Cloyde Reams, RN, BSN Referring MD: Charlton Haws MD PCP: Deri Fuelling Supervising MD: Graciela Husbands MD, Viviann Spare Indication 1: Atrial Fibrillation (ICD-427.31) Lab Used: [facility] Site: [facility] INR POC 3.3 INR RANGE 3 - 3.5  Dietary changes: no    Health status changes: no    Bleeding/hemorrhagic complications: no    Recent/future hospitalizations: no    Any changes in medication regimen? no    Recent/future dental: no  Any missed doses?: no       Is patient compliant with meds? yes       Allergies (verified): No Known Drug Allergies  Anticoagulation Management History:      The patient is taking warfarin and comes in today for a routine follow up visit.  Positive risk factors for bleeding include an age of 70 years or older.  The bleeding index is 'intermediate risk'.  Positive CHADS2 values include History of CHF and History of HTN.  Negative CHADS2 values include Age > 39 years old.  The start date was 08/06/2006.  Her last INR was 2.4 ratio.  Anticoagulation responsible provider: Graciela Husbands MD, Viviann Spare.  INR POC: 3.3.  Cuvette Lot#: [id number].  Exp: 10/2010.    Anticoagulation Management Assessment/Plan:      The patient's current anticoagulation dose is Coumadin 5 mg  tabs: take as directed by coumadin clinic.  The target INR is 3 - 3.5.  The next INR is due 11/08/2009.  Anticoagulation instructions were given to patient.  Results were reviewed/authorized by Cloyde Reams, RN, BSN.  She was notified by Cloyde Reams RN.         Prior Anticoagulation Instructions: INR 4.2  Take 2.5mg  today then start taking 7.5mg  daily except 10mg  on Tuesdays and Saturdays.   Recheck in 2-3 weeks.    Current Anticoagulation Instructions: INR 3.3  Continue on same dosage 7.5mg  daily except 10mg  on Tuesdays and Saturdays.   Recheck in 4 weeks.

## 2010-11-01 NOTE — Medication Information (Signed)
Summary: rov/ewj  Anticoagulant Therapy  Managed by: Weston Brass, PharmD Referring MD: Charlton Haws MD PCP: Deri Fuelling Supervising MD: Antoine Poche MD, Fayrene Fearing Indication 1: Atrial Fibrillation (ICD-427.31) Lab Used: [facility] Site: [facility] INR RANGE 3 - 3.5  Dietary changes: no    Health status changes: no    Bleeding/hemorrhagic complications: no    Recent/future hospitalizations: no    Any changes in medication regimen? yes       Details: Stopped digoxin 10/25  Recent/future dental: no  Any missed doses?: no       Is patient compliant with meds? yes       Allergies: No Known Drug Allergies  Anticoagulation Management History:      The patient is taking warfarin and comes in today for a routine follow up visit.  Positive risk factors for bleeding include an age of 70 years or older.  The bleeding index is 'intermediate risk'.  Positive CHADS2 values include History of CHF and History of HTN.  Negative CHADS2 values include Age > 71 years old.  The start date was 08/06/2006.  Her last INR was 2.4 ratio.  Anticoagulation responsible provider: Antoine Poche MD, Fayrene Fearing.  Cuvette Lot#: [id number].  Exp: 08/2011.    Anticoagulation Management Assessment/Plan:      The patient's current anticoagulation dose is Coumadin 5 mg  tabs: take as directed by coumadin clinic.  The target INR is 3 - 3.5.  The next INR is due 08/31/2010.  Anticoagulation instructions were given to patient.  Results were reviewed/authorized by Weston Brass, PharmD.  She was notified by Hoy Register, PharmD Candidate.         Prior Anticoagulation Instructions: INR 2.8  Take 2 tablets tomorrow, then resume same dosage 1.5 tablets daily except 2 tablets on Tuesdays, and Saturdays.  Recheck in 4 weeks.    Current Anticoagulation Instructions: INR 2.5 Take 2 tablets today then take 1.5 tablets everyday except 2 tablets on Tuesday, Thursday and Saturday Recheck INR in 2 weeks

## 2010-11-01 NOTE — Medication Information (Signed)
Summary: rov/tm  Anticoagulant Therapy  Managed by: Weston Brass, PharmD Referring MD: Charlton Haws MD PCP: Deri Fuelling Supervising MD: Antoine Poche MD, Fayrene Fearing Indication 1: Atrial Fibrillation (ICD-427.31) Lab Used: [facility]  Site: [facility] INR POC 3.0 INR RANGE 3 - 3.5  Dietary changes: no    Health status changes: no    Bleeding/hemorrhagic complications: no    Recent/future hospitalizations: no    Any changes in medication regimen? no    Recent/future dental: no  Any missed doses?: no       Is patient compliant with meds? yes       Allergies: No Known Drug Allergies  Anticoagulation Management History:      The patient is taking warfarin and comes in today for a routine follow up visit.  Positive risk factors for bleeding include an age of 70 years or older.  The bleeding index is 'intermediate risk'.  Positive CHADS2 values include History of CHF and History of HTN.  Negative CHADS2 values include Age > 40 years old.  The start date was 08/06/2006.  Her last INR was 2.4 ratio.  Anticoagulation responsible provider: Antoine Poche MD, Fayrene Fearing.  INR POC: 3.0.  Cuvette Lot#: [id number].  Exp: 05/2011.    Anticoagulation Management Assessment/Plan:      The patient's current anticoagulation dose is Coumadin 5 mg  tabs: take as directed by coumadin clinic.  The target INR is 3 - 3.5.  The next INR is due 04/04/2010.  Anticoagulation instructions were given to patient.  Results were reviewed/authorized by Weston Brass, PharmD.  She was notified by Weston Brass PharmD.         Prior Anticoagulation Instructions: INR 2.8 Continue 7.5mg  everyday except 10mg  on Tuesdays and Saturdays. Recheck in 3 weeks.   Current Anticoagulation Instructions: INR 3.0  Continue same dose of 1 1/2 tablets every day except 2 tablets on Tuesday and Saturday.

## 2010-11-01 NOTE — Medication Information (Signed)
Summary: cvrr   rov    js  Anticoagulant Therapy  Managed by: Shelby Dubin, PharmD, BCPS, CPP Referring MD: Charlton Haws MD PCP: Deri Fuelling Supervising MD: Juanda Chance MD, Lauralynn Loeb Indication 1: Atrial Fibrillation (ICD-427.31) Lab Used: [facility] Site: [facility] INR POC 3 INR RANGE 3 - 3.5  Dietary changes: no    Health status changes: no    Bleeding/hemorrhagic complications: no    Recent/future hospitalizations: no    Any changes in medication regimen? no    Recent/future dental: no  Any missed doses?: no       Is patient compliant with meds? yes       Current Medications (verified): 1)  Advair Diskus 500-50 Mcg/dose  Misc (Fluticasone-Salmeterol) .... Inhale 1 Puff Two Times A Day 2)  Furosemide 40 Mg Tabs (Furosemide) .Marland Kitchen.. 1 Tab Morning 2 Tabs Eve 3)  Lipitor 40 Mg Tabs (Atorvastatin Calcium) .... Take One By Mouth Once Daily 4)  Coumadin 5 Mg  Tabs (Warfarin Sodium) .... Take As Directed By Coumadin Clinic 5)  Synthroid 25 Mcg  Tabs (Levothyroxine Sodium) .... Take 1 Tablet By Mouth Once A Day 6)  Carvedilol 12.5 Mg Tabs (Carvedilol) .... One Half By Mouth Two Times A Day 7)  Omeprazole 20 Mg Cpdr (Omeprazole) .Marland Kitchen.. 1 Tab Every Morning 8)  Spiriva Handihaler 18 Mcg  Caps (Tiotropium Bromide Monohydrate) .... One Puffs in Handihaler Daily 9)  Vitamin D 1000 Unit Tabs (Cholecalciferol) .... Take On By Mouth Once Daily 10)  Fosamax 70 Mg Tabs (Alendronate Sodium) .... Take One By Mouth Every Week 11)  Klor-Con 20 Meq Pack (Potassium Chloride) .... One By Mouth Daily 12)  Digoxin 0.25 Mg Tabs (Digoxin) .... Take One Tablet By Mouth Daily 13)  Metoprolol Tartrate 50 Mg Tabs (Metoprolol Tartrate) .... One Half By Mouth Two Times A Day 14)  Allopurinol 100 Mg Tabs (Allopurinol) .... Take One Tablet By Mouth Daily 15)  Sulfasalazine 500 Mg Tabs (Sulfasalazine) .... Two Tablets Two Times A Day 16)  Cozaar 25 Mg Tabs (Losartan Potassium) .... Take One Tab At Bedtime 17)   Lantus 100 Unit/ml Soln (Insulin Glargine) .... 30 Units Sub-Q Every Am 18)  Novolog Flexpen 100 Unit/ml Soln (Insulin Aspart) .Marland Kitchen.. 10 Units Sub-Q  Before Meals  Allergies (verified): No Known Drug Allergies  Anticoagulation Management History:      The patient is taking warfarin and comes in today for a routine follow up visit.  Positive risk factors for bleeding include an age of 70 years or older.  The bleeding index is 'intermediate risk'.  Positive CHADS2 values include History of CHF and History of HTN.  Negative CHADS2 values include Age > 63 years old.  The start date was 08/06/2006.  Her last INR was 2.4 ratio.  Anticoagulation responsible provider: Juanda Chance MD, Smitty Cords.  INR POC: 3.  Cuvette Lot#: [id number].  Exp: 01/2011.    Anticoagulation Management Assessment/Plan:      The patient's current anticoagulation dose is Coumadin 5 mg  tabs: take as directed by coumadin clinic.  The target INR is 3 - 3.5.  The next INR is due 12/29/2009.  Anticoagulation instructions were given to patient.  Results were reviewed/authorized by Shelby Dubin, PharmD, BCPS, CPP.  She was notified by Shelby Dubin PharmD, BCPS, CPP.         Prior Anticoagulation Instructions: INR 3.7  Take 1.5 tabs tomorrow, then resume 2 tabs each Tuesday and Saturday and 1.5 tabs on all other days.  Current Anticoagulation Instructions: INR 3  Continue 2 tabs each Tuesday and Saturday and 1.5 tabs daily.    Recheck in 4 weeks.

## 2010-11-01 NOTE — Medication Information (Signed)
Summary: rov/jaj  Anticoagulant Therapy  Managed by: Reina Fuse, PharmD Referring MD: Charlton Haws MD PCP: Deri Fuelling Supervising MD: Jens Som MD, Arlys John Indication 1: Atrial Fibrillation (ICD-427.31) Lab Used: [facility] Site: [facility] INR POC 4.1 INR RANGE 3 - 3.5  Dietary changes: no    Health status changes: no    Bleeding/hemorrhagic complications: no    Recent/future hospitalizations: no    Any changes in medication regimen? no    Recent/future dental: no  Any missed doses?: no       Is patient compliant with meds? yes       Current Medications (verified): 1)  Advair Diskus 500-50 Mcg/dose  Misc (Fluticasone-Salmeterol) .... Inhale 1 Puff Two Times A Day 2)  Furosemide 40 Mg Tabs (Furosemide) .Marland Kitchen.. 1 Tab Morning 2 Tabs Eve 3)  Lipitor 80 Mg Tabs (Atorvastatin Calcium) .Marland Kitchen.. 1 Tab in The Evening 4)  Coumadin 5 Mg  Tabs (Warfarin Sodium) .... Take As Directed By Coumadin Clinic 5)  Synthroid 25 Mcg  Tabs (Levothyroxine Sodium) .... Take 1 Tablet By Mouth Once A Day 6)  Carvedilol 12.5 Mg Tabs (Carvedilol) .... One Half By Mouth Two Times A Day 7)  Omeprazole 20 Mg Cpdr (Omeprazole) .Marland Kitchen.. 1 Tab Every Morning 8)  Spiriva Handihaler 18 Mcg  Caps (Tiotropium Bromide Monohydrate) .... One Puffs in Handihaler Daily-Pt Not Using As Much 9)  Vitamin D 2000 Unit Tabs (Cholecalciferol) .... Once Daily 10)  Fosamax 70 Mg Tabs (Alendronate Sodium) .... Take One By Mouth Every Week 11)  Klor-Con 20 Meq Pack (Potassium Chloride) .... One By Mouth Daily 12)  Digoxin 0.25 Mg Tabs (Digoxin) .... Take One Tablet By Mouth Daily 13)  Metoprolol Tartrate 50 Mg Tabs (Metoprolol Tartrate) .... Take 1 Tablet Two Times A Day 14)  Allopurinol 100 Mg Tabs (Allopurinol) .... Take One Tablet By Mouth Daily 15)  Sulfasalazine 500 Mg Tabs (Sulfasalazine) .... Two Tablets Two Times A Day 16)  Cozaar 25 Mg Tabs (Losartan Potassium) .... Take One Tab At Bedtime 17)  Lantus 100 Unit/ml Soln  (Insulin Glargine) .... 30 Units Sub-Q Every Am 18)  Novolog Flexpen 100 Unit/ml Soln (Insulin Aspart) .Marland Kitchen.. 10 Units Sub-Q  Before Meals or 2 To 3 Times Daily  Allergies (verified): No Known Drug Allergies  Anticoagulation Management History:      The patient is taking warfarin and comes in today for a routine follow up visit.  Positive risk factors for bleeding include an age of 29 years or older.  The bleeding index is 'intermediate risk'.  Positive CHADS2 values include History of CHF and History of HTN.  Negative CHADS2 values include Age > 74 years old.  The start date was 08/06/2006.  Her last INR was 2.4 ratio.  Anticoagulation responsible provider: Jens Som MD, Arlys John.  INR POC: 4.1.  Cuvette Lot#: [id number].  Exp: 08/2011.    Anticoagulation Management Assessment/Plan:      The patient's current anticoagulation dose is Coumadin 5 mg  tabs: take as directed by coumadin clinic.  The target INR is 3 - 3.5.  The next INR is due 07/24/2010.  Anticoagulation instructions were given to patient.  Results were reviewed/authorized by Reina Fuse, PharmD.  She was notified by Reina Fuse, PharmD.         Prior Anticoagulation Instructions: INR 3.1  Continue taking 1 1/2 tablets everyday except take 2 tablets on Tuesdays and Saturdays. Re-check INR in 3 weeks.   Current Anticoagulation Instructions: INR 4.1  Today, Tuesday, October  4th, take Coumadin 1 tab (5 mg). Then, resume taking Coumadin 1.5 tabs (7.5 mg) on all days except Coumadin 2 tabs (10 mg) on Tues and Sat. Return to clinic in 3 weeks.

## 2010-11-01 NOTE — Medication Information (Signed)
Summary: rov/sl  Anticoagulant Therapy  Managed by: Cloyde Reams, RN, BSN Referring MD: Charlton Haws MD PCP: Deri Fuelling Supervising MD: Shirlee Latch MD, Dalton Indication 1: Atrial Fibrillation (ICD-427.31) Lab Used: [facility] Site: [facility] INR POC 2.8 INR RANGE 3 - 3.5  Dietary changes: no    Health status changes: no    Bleeding/hemorrhagic complications: no    Recent/future hospitalizations: no    Any changes in medication regimen? no    Recent/future dental: no  Any missed doses?: no       Is patient compliant with meds? yes       Allergies: No Known Drug Allergies  Anticoagulation Management History:      The patient is taking warfarin and comes in today for a routine follow up visit.  Positive risk factors for bleeding include an age of 71 years or older.  The bleeding index is 'intermediate risk'.  Positive CHADS2 values include History of CHF and History of HTN.  Negative CHADS2 values include Age > 42 years old.  The start date was 08/06/2006.  Her last INR was 2.4 ratio.  Anticoagulation responsible provider: Shirlee Latch MD, Dalton.  INR POC: 2.8.  Cuvette Lot#: [id number].  Exp: 08/2011.    Anticoagulation Management Assessment/Plan:      The patient's current anticoagulation dose is Coumadin 5 mg  tabs: take as directed by coumadin clinic.  The target INR is 3 - 3.5.  The next INR is due 08/17/2010.  Anticoagulation instructions were given to patient.  Results were reviewed/authorized by Cloyde Reams, RN, BSN.  She was notified by Cloyde Reams RN.         Prior Anticoagulation Instructions: INR 4.1  Today, Tuesday, October 4th, take Coumadin 1 tab (5 mg). Then, resume taking Coumadin 1.5 tabs (7.5 mg) on all days except Coumadin 2 tabs (10 mg) on Tues and Sat. Return to clinic in 3 weeks.    Current Anticoagulation Instructions: INR 2.8  Take 2 tablets tomorrow, then resume same dosage 1.5 tablets daily except 2 tablets on Tuesdays, and Saturdays.  Recheck  in 4 weeks.

## 2010-11-01 NOTE — Medication Information (Signed)
Summary: rov/sp  Anticoagulant Therapy  Managed by: Weston Brass, PharmD Referring MD: Charlton Haws MD PCP: Deri Fuelling Supervising MD: Shirlee Latch MD, Freida Busman Indication 1: Atrial Fibrillation (ICD-427.31) Lab Used: [facility] Site: [facility] INR POC 1.4 INR RANGE 3 - 3.5  Dietary changes: no    Health status changes: yes       Details: Pt had procedure last week and restarted Coumadin on Friday.  She may have missed Saturday's dose as well.   Bleeding/hemorrhagic complications: no    Recent/future hospitalizations: no    Any changes in medication regimen? no    Recent/future dental: no  Any missed doses?: yes     Details: May have missed Saturday's dose- she is not 100% sure however.   Is patient compliant with meds? yes       Allergies: No Known Drug Allergies  Anticoagulation Management History:      The patient is taking warfarin and comes in today for a routine follow up visit.  Positive risk factors for bleeding include an age of 31 years or older.  The bleeding index is 'intermediate risk'.  Positive CHADS2 values include History of CHF and History of HTN.  Negative CHADS2 values include Age > 40 years old.  The start date was 08/06/2006.  Her last INR was 2.4 ratio.  Anticoagulation responsible provider: Shirlee Latch MD, Dalton.  INR POC: 1.4.  Cuvette Lot#: [id number].  Exp: 06/2011.    Anticoagulation Management Assessment/Plan:      The patient's current anticoagulation dose is Coumadin 5 mg  tabs: take as directed by coumadin clinic.  The target INR is 3 - 3.5.  The next INR is due 05/23/2010.  Anticoagulation instructions were given to patient.  Results were reviewed/authorized by Weston Brass, PharmD.  She was notified by Gweneth Fritter, PharmD Candidate.         Prior Anticoagulation Instructions: INR 2.9  Take 2 tablets today then resume same dose of 1 1/2 tablets every day except 2 tablets on Tuesday and Saturday.   Current Anticoagulation Instructions: INR 1.4  Take  2 tablets (10mg ) tonight.  Take 2.5 tablets (12.5mg ) tomorrow.  Then resume normal schedule of taking 1.5 tablets (7.5mg ) every day except take 2 tablets (10mg ) on Tuesdays and Saturdays.  Recheck in 1 week.

## 2010-11-01 NOTE — Medication Information (Signed)
Summary: rov/sp  Anticoagulant Therapy  Managed by: Weston Brass, PharmD Referring MD: Charlton Haws MD PCP: Deri Fuelling Supervising MD: Johney Frame MD, Fayrene Fearing Indication 1: Atrial Fibrillation (ICD-427.31) Lab Used: [facility] Site: [facility] INR POC 2.9 INR RANGE 3 - 3.5  Dietary changes: no    Health status changes: no    Bleeding/hemorrhagic complications: no    Recent/future hospitalizations: no    Any changes in medication regimen? no    Recent/future dental: no  Any missed doses?: no       Is patient compliant with meds? yes       Allergies: No Known Drug Allergies  Anticoagulation Management History:      The patient is taking warfarin and comes in today for a routine follow up visit.  Positive risk factors for bleeding include an age of 70 years or older.  The bleeding index is 'intermediate risk'.  Positive CHADS2 values include History of CHF and History of HTN.  Negative CHADS2 values include Age > 37 years old.  The start date was 08/06/2006.  Her last INR was 2.4 ratio.  Anticoagulation responsible provider: Toniyah Dilmore MD, Fayrene Fearing.  INR POC: 2.9.  Cuvette Lot#: [id number].  Exp: 06/2011.    Anticoagulation Management Assessment/Plan:      The patient's current anticoagulation dose is Coumadin 5 mg  tabs: take as directed by coumadin clinic.  The target INR is 3 - 3.5.  The next INR is due 05/04/2010.  Anticoagulation instructions were given to patient.  Results were reviewed/authorized by Weston Brass, PharmD.  She was notified by Weston Brass PharmD.         Prior Anticoagulation Instructions: INR 3.0  Continue same dose of 1 1/2 tablets every day except 2 tablets on Tuesday and Saturday.   Current Anticoagulation Instructions: INR 2.9  Take 2 tablets today then resume same dose of 1 1/2 tablets every day except 2 tablets on Tuesday and Saturday.

## 2010-11-01 NOTE — Medication Information (Signed)
Summary: rov/tm  Anticoagulant Therapy  Managed by: Bethena Midget, RN, BSN Referring MD: Charlton Haws MD PCP: Deri Fuelling Supervising MD: Riley Kill MD, Maisie Fus Indication 1: Atrial Fibrillation (ICD-427.31) Lab Used: [facility] Site: [facility] INR POC 3.1 INR RANGE 3 - 3.5  Dietary changes: no    Health status changes: no    Bleeding/hemorrhagic complications: no    Recent/future hospitalizations: no    Any changes in medication regimen? no    Recent/future dental: no  Any missed doses?: no       Is patient compliant with meds? yes       Allergies: No Known Drug Allergies  Anticoagulation Management History:      The patient is taking warfarin and comes in today for a routine follow up visit.  Positive risk factors for bleeding include an age of 70 years or older.  The bleeding index is 'intermediate risk'.  Positive CHADS2 values include History of CHF and History of HTN.  Negative CHADS2 values include Age > 22 years old.  The start date was 08/06/2006.  Her last INR was 2.4 ratio.  Anticoagulation responsible Trayquan Kolakowski: Riley Kill MD, Maisie Fus.  INR POC: 3.1.  Cuvette Lot#: [id number].  Exp: 10/2011.    Anticoagulation Management Assessment/Plan:      The patient's current anticoagulation dose is Coumadin 5 mg  tabs: take as directed by coumadin clinic.  The target INR is 3 - 3.5.  The next INR is due 10/10/2010.  Anticoagulation instructions were given to patient.  Results were reviewed/authorized by Bethena Midget, RN, BSN.  She was notified by Bethena Midget, RN, BSN.         Prior Anticoagulation Instructions: INR 2.7 Today take 2 tablets  then start taking 1.5 tablets everyday except 2 tablets on Tuesdays, Thursdays and Saturdays. Recheck in 2 weeks.   Current Anticoagulation Instructions: INR 3.1 Continue 7.5mg s everyday except 10mg s on Tuesdays, Thursdays and Saturdays. Recheck in 3 weeks.

## 2010-11-01 NOTE — Cardiovascular Report (Signed)
Summary: Office Visit   Office Visit   Imported By: Roderic Ovens 01/18/2010 13:54:26  _____________________________________________________________________  External Attachment:    Type:   Image     Comment:   External Document

## 2010-11-01 NOTE — Medication Information (Signed)
Summary: rov/ewj  Anticoagulant Therapy  Managed by: Shelby Dubin, PharmD, BCPS, CPP Referring MD: Charlton Haws MD PCP: Deri Fuelling Supervising MD: Graciela Husbands MD, Viviann Spare Indication 1: Atrial Fibrillation (ICD-427.31) Lab Used: [facility] Site: [facility] INR POC 3.7 INR RANGE 3 - 3.5  Dietary changes: no    Health status changes: no    Bleeding/hemorrhagic complications: no    Recent/future hospitalizations: no    Any changes in medication regimen? no    Recent/future dental: no  Any missed doses?: no       Is patient compliant with meds? yes       Allergies (verified): No Known Drug Allergies  Anticoagulation Management History:      The patient is taking warfarin and comes in today for a routine follow up visit.  Positive risk factors for bleeding include an age of 30 years or older.  The bleeding index is 'intermediate risk'.  Positive CHADS2 values include History of CHF and History of HTN.  Negative CHADS2 values include Age > 21 years old.  The start date was 08/06/2006.  Her last INR was 2.4 ratio.  Anticoagulation responsible provider: Graciela Husbands MD, Viviann Spare.  INR POC: 3.7.  Cuvette Lot#: [id number].  Exp: 12/2010.    Anticoagulation Management Assessment/Plan:      The patient's current anticoagulation dose is Coumadin 5 mg  tabs: take as directed by coumadin clinic.  The target INR is 3 - 3.5.  The next INR is due 12/01/2009.  Anticoagulation instructions were given to patient.  Results were reviewed/authorized by Shelby Dubin, PharmD, BCPS, CPP.  She was notified by Shelby Dubin PharmD, BCPS, CPP.         Prior Anticoagulation Instructions: INR 3.3  Continue on same dosage 7.5mg  daily except 10mg  on Tuesdays and Saturdays.   Recheck in 4 weeks.    Current Anticoagulation Instructions: INR 3.7  Take 1.5 tabs tomorrow, then resume 2 tabs each Tuesday and Saturday and 1.5 tabs on all other days.

## 2010-11-01 NOTE — Cardiovascular Report (Signed)
Summary: Office Visit   Office Visit   Imported By: Roderic Ovens 07/12/2010 12:10:59  _____________________________________________________________________  External Attachment:    Type:   Image     Comment:   External Document

## 2010-11-01 NOTE — Medication Information (Signed)
Summary: rov/nb  Anticoagulant Therapy  Managed by: Bethena Midget, RN, BSN Referring MD: Charlton Haws MD PCP: Deri Fuelling Supervising MD: Excell Seltzer MD, Casimiro Needle Indication 1: Atrial Fibrillation (ICD-427.31) Lab Used: [facility] Site: [facility] INR POC 2.7 INR RANGE 3 - 3.5  Dietary changes: no    Health status changes: no    Bleeding/hemorrhagic complications: no    Recent/future hospitalizations: no    Any changes in medication regimen? no    Recent/future dental: no  Any missed doses?: no       Is patient compliant with meds? yes       Allergies: No Known Drug Allergies  Anticoagulation Management History:      The patient is taking warfarin and comes in today for a routine follow up visit.  Positive risk factors for bleeding include an age of 70 years or older.  The bleeding index is 'intermediate risk'.  Positive CHADS2 values include History of CHF and History of HTN.  Negative CHADS2 values include Age > 71 years old.  The start date was 08/06/2006.  Her last INR was 2.4 ratio.  Anticoagulation responsible provider: Excell Seltzer MD, Casimiro Needle.  INR POC: 2.7.  Cuvette Lot#: [id number].  Exp: 08/2011.    Anticoagulation Management Assessment/Plan:      The patient's current anticoagulation dose is Coumadin 5 mg  tabs: take as directed by coumadin clinic.  The target INR is 3 - 3.5.  The next INR is due 09/17/2010.  Anticoagulation instructions were given to patient.  Results were reviewed/authorized by Bethena Midget, RN, BSN.  She was notified by Bethena Midget, RN, BSN.         Prior Anticoagulation Instructions: INR 2.5 Take 2 tablets today then take 1.5 tablets everyday except 2 tablets on Tuesday, Thursday and Saturday Recheck INR in 2 weeks   Current Anticoagulation Instructions: INR 2.7 Today take 2 tablets  then start taking 1.5 tablets everyday except 2 tablets on Tuesdays, Thursdays and Saturdays. Recheck in 2 weeks.

## 2010-11-01 NOTE — Medication Information (Signed)
Summary: [name]  Anticoagulant Therapy  Managed by: Reina Fuse, PharmD Referring MD: Charlton Haws MD PCP: Deri Fuelling Supervising MD: Antoine Poche MD, Fayrene Fearing Indication 1: Atrial Fibrillation (ICD-427.31) Lab Used: [facility] Site: [facility] INR POC 3.9 INR RANGE 3 - 3.5  Dietary changes: no    Health status changes: no    Bleeding/hemorrhagic complications: no    Recent/future hospitalizations: no    Any changes in medication regimen? no    Recent/future dental: no  Any missed doses?: no       Is patient compliant with meds? yes       Allergies: No Known Drug Allergies  Anticoagulation Management History:      The patient is taking warfarin and comes in today for a routine follow up visit.  Positive risk factors for bleeding include an age of 6 years or older.  The bleeding index is 'intermediate risk'.  Positive CHADS2 values include History of CHF and History of HTN.  Negative CHADS2 values include Age > 44 years old.  The start date was 08/06/2006.  Her last INR was 2.4 ratio.  Anticoagulation responsible provider: Antoine Poche MD, Fayrene Fearing.  INR POC: 3.9.  Cuvette Lot#: [id number].  Exp: 06/2011.    Anticoagulation Management Assessment/Plan:      The patient's current anticoagulation dose is Coumadin 5 mg  tabs: take as directed by coumadin clinic.  The target INR is 3 - 3.5.  The next INR is due 06/08/2010.  Anticoagulation instructions were given to patient.  Results were reviewed/authorized by Reina Fuse, PharmD.  She was notified by Reina Fuse PharmD.         Prior Anticoagulation Instructions: INR 1.4  Take 2 tablets (10mg ) tonight.  Take 2.5 tablets (12.5mg ) tomorrow.  Then resume normal schedule of taking 1.5 tablets (7.5mg ) every day except take 2 tablets (10mg ) on Tuesdays and Saturdays.  Recheck in 1 week.   Current Anticoagulation Instructions: INR 3.9  Tonight, take Coumadin 0.5 tab (2.5 mg).  Then, continue taking Coumadin 1.5 tabs (7.5 mg) on all days  except Coumadin 2 tabs (10 mg) on Tuesdays and Saturdays. Return to clinic in 2 weeks.

## 2010-11-01 NOTE — Medication Information (Signed)
Summary: rov/tm  Anticoagulant Therapy  Managed by: Weston Brass, PharmD Referring MD: Charlton Haws MD PCP: Deri Fuelling Supervising MD: Johney Frame MD, Fayrene Fearing Indication 1: Atrial Fibrillation (ICD-427.31) Lab Used: [facility] Site: [facility] INR POC 2.6 INR RANGE 3 - 3.5  Dietary changes: yes       Details: extra salads this weekend  Health status changes: no    Bleeding/hemorrhagic complications: no    Recent/future hospitalizations: no    Any changes in medication regimen? no    Recent/future dental: no  Any missed doses?: no       Is patient compliant with meds? yes       Allergies: No Known Drug Allergies  Anticoagulation Management History:      The patient is taking warfarin and comes in today for a routine follow up visit.  Positive risk factors for bleeding include an age of 70 years or older.  The bleeding index is 'intermediate risk'.  Positive CHADS2 values include History of CHF and History of HTN.  Negative CHADS2 values include Age > 38 years old.  The start date was 08/06/2006.  Her last INR was 2.4 ratio.  Anticoagulation responsible provider: Leonora Gores MD, Fayrene Fearing.  INR POC: 2.6.  Cuvette Lot#: [id number].  Exp: 11/2011.    Anticoagulation Management Assessment/Plan:      The patient's current anticoagulation dose is Coumadin 5 mg  tabs: take as directed by coumadin clinic.  The target INR is 3 - 3.5.  The next INR is due 10/31/2010.  Anticoagulation instructions were given to patient.  Results were reviewed/authorized by Weston Brass, PharmD.  She was notified by Weston Brass PharmD.         Prior Anticoagulation Instructions: INR 3.1 Continue 7.5mg s everyday except 10mg s on Tuesdays, Thursdays and Saturdays. Recheck in 3 weeks.   Current Anticoagulation Instructions: INR 2.6  Take 2 tablets today then resume same dose of 1 1/2 tablets every day except 2 tablets on Tuesday, Thursday and Saturday.  Recheck INR in 3 weeks.

## 2010-11-01 NOTE — Medication Information (Signed)
Summary: rov/sl  Anticoagulant Therapy  Managed by: Bethena Midget, RN, BSN Referring MD: Charlton Haws MD PCP: Deri Fuelling Supervising MD: Tenny Craw MD, Gunnar Fusi Indication 1: Atrial Fibrillation (ICD-427.31) Lab Used: [facility] Site: [facility] INR POC 3.1 INR RANGE 3 - 3.5  Dietary changes: no    Health status changes: no    Bleeding/hemorrhagic complications: no    Recent/future hospitalizations: no    Any changes in medication regimen? no    Recent/future dental: no  Any missed doses?: yes     Details: Pt is supposed to take 2 tablets on Sat but forgot, so took 2 on Sun instead  Is patient compliant with meds? yes       Allergies: No Known Drug Allergies  Anticoagulation Management History:      The patient is taking warfarin and comes in today for a routine follow up visit.  Positive risk factors for bleeding include an age of 70 years or older.  The bleeding index is 'intermediate risk'.  Positive CHADS2 values include History of CHF and History of HTN.  Negative CHADS2 values include Age > 71 years old.  The start date was 08/06/2006.  Her last INR was 2.4 ratio.  Anticoagulation responsible provider: Tenny Craw MD, Gunnar Fusi.  INR POC: 3.1.  Cuvette Lot#: [id number].  Exp: 08/2011.    Anticoagulation Management Assessment/Plan:      The patient's current anticoagulation dose is Coumadin 5 mg  tabs: take as directed by coumadin clinic.  The target INR is 3 - 3.5.  The next INR is due 07/03/2010.  Anticoagulation instructions were given to patient.  Results were reviewed/authorized by Bethena Midget, RN, BSN.  She was notified by Harrel Carina, PharmD candidate.         Prior Anticoagulation Instructions: INR 3.9  Tonight, take Coumadin 0.5 tab (2.5 mg).  Then, continue taking Coumadin 1.5 tabs (7.5 mg) on all days except Coumadin 2 tabs (10 mg) on Tuesdays and Saturdays. Return to clinic in 2 weeks.    Current Anticoagulation Instructions: INR 3.1  Continue taking 1 1/2  tablets everyday except take 2 tablets on Tuesdays and Saturdays. Re-check INR in 3 weeks.

## 2010-11-01 NOTE — Assessment & Plan Note (Signed)
Summary: medtronic/sf   Referring Provider:  Graciela Husbands Primary Provider:  Deri Fuelling  CC:  medtronic device check.  History of Present Illness: [name] is seen in followup for atrial fibrillation with a somewhat rapid ventricular response. We have been using beta blockers to control the rate with good success of late.She has continued to do marvelously with ongoing 30 pound weight loss over the last 6 months now 60 pounds over the last year. This has been associated with a significant improvement in her exercise tolerance fatigue and shortness of breath.  She has minimal peripheral edema.  Current Medications (verified): 1)  Advair Diskus 500-50 Mcg/dose  Misc (Fluticasone-Salmeterol) .... Inhale 1 Puff Two Times A Day 2)  Furosemide 40 Mg Tabs (Furosemide) .Marland Kitchen.. 1 Tab Morning 2 Tabs Eve 3)  Lipitor 80 Mg Tabs (Atorvastatin Calcium) .Marland Kitchen.. 1 Tab in The Evening 4)  Coumadin 5 Mg  Tabs (Warfarin Sodium) .... Take As Directed By Coumadin Clinic 5)  Synthroid 25 Mcg  Tabs (Levothyroxine Sodium) .... Take 1 Tablet By Mouth Once A Day 6)  Carvedilol 12.5 Mg Tabs (Carvedilol) .... One Half By Mouth Two Times A Day 7)  Omeprazole 20 Mg Cpdr (Omeprazole) .Marland Kitchen.. 1 Tab Every Morning 8)  Spiriva Handihaler 18 Mcg  Caps (Tiotropium Bromide Monohydrate) .... One Puffs in Handihaler Daily-Pt Not Using As Much 9)  Vitamin D 2000 Unit Tabs (Cholecalciferol) .... Once Daily 10)  Fosamax 70 Mg Tabs (Alendronate Sodium) .... Take One By Mouth Every Week 11)  Klor-Con 20 Meq Pack (Potassium Chloride) .... One By Mouth Daily 12)  Digoxin 0.25 Mg Tabs (Digoxin) .... Take One Tablet By Mouth Daily 13)  Metoprolol Tartrate 50 Mg Tabs (Metoprolol Tartrate) .... Take 1 Tablet Two Times A Day 14)  Allopurinol 100 Mg Tabs (Allopurinol) .... Take One Tablet By Mouth Daily 15)  Sulfasalazine 500 Mg Tabs (Sulfasalazine) .... Two Tablets Two Times A Day 16)  Cozaar 25 Mg Tabs (Losartan Potassium) .... Take One Tab At  Bedtime 17)  Lantus 100 Unit/ml Soln (Insulin Glargine) .... 30 Units Sub-Q Every Am 18)  Novolog Flexpen 100 Unit/ml Soln (Insulin Aspart) .Marland Kitchen.. 10 Units Sub-Q  Before Meals or 2 To 3 Times Daily  Allergies (verified): No Known Drug Allergies  Past History:  Past Medical History: Last updated: 01/15/2010 Current Problems:       Atrial  Fibrillation refractory to Tikosyn, Amiodarone, Flecainide, Rythmol  1. History of nonischemic cardiomyopathy, ejection fraction of 20-25%       at left heart catheterization in September 2008.   2. History of decompensated congestive heart failure with       hospitalizations in August 2008 and July 2009.   3. Morbid obesity.   4. Hyperlipidemia.   6. Tachybrady syndrome status post implant of a Medtronic dual-chamber       pacemaker in 2001. -  explanted 2010--Medtronic ADAPTA L dual-chamber pacemaker 2010  7. Chronic obstructive pulmonary disease.   8. Osteoporosis.   9. Depression.   10.Obstructive sleep apnea/continuous positive airway pressure.   11.Gastroesophageal reflux disease.   12.Crohn's disease   13.Treated hypothyroidism.   14.PULMONARY VEIN ISOLATION PROCEDURE May 12, 2008   l5.Latest DCCV (on Amiodarone 800 mg daily) 06/28/08   16.Seasonal Allergies  Past Surgical History: Last updated: 01/15/2010 RFCA Atrial Fibrillation - Pulmonary Vein Isolation [facility]) May 12, 2008 Post Ablation Pseudoaneurysm  (at A fib Ablation) Cath, cardiac, 12/01:  noncritical disease mid RCA, EF preserved Cath, cardiac, 05/29/07: noncritical disease,  EF 20;25%  See Report Section Medtronic ADAPTA L dual-chamber pacemaker-implanted 2010  Family History: Last updated: 09/07/2008 Mother deceased age 39 of breast cancer Father deceased age 28 of CAD No siblings  Social History: Last updated: 09/07/2008 Quit smoking 1988 Prior history 32 years/ 3 ppd Alcohol Use - no Caffeine use not excessive One daughter/ one son Pharmacist, community  at [employer]  Vital Signs:  Patient profile:   70 year old female Height:      71 inches Weight:      236 pounds BMI:     33.03 Pulse rate:   73 / minute Pulse rhythm:   regular BP sitting:   110 / 66  (left arm) Cuff size:   large  Vitals Entered By: Judithe Modest CMA (January 16, 2010 12:41 PM)   Physical Exam  General:  The patient was alert and oriented in no acute distress.Neck veins were flat, carotids were brisk. Lungs were clear. Heart sounds were irregular without murmurs or gallops. Abdomen was soft with active bowel sounds. There is no clubbing cyanosis or edema.    PPM Specifications Following MD:  Sherryl Manges, MD     PPM Vendor:  Medtronic     PPM Model Number:  [id number]     PPM Serial Number:  [id number] PPM DOI:  03/13/2009     PPM Implanting MD:  Sherryl Manges, MD  Lead 1    Location: RA     DOI: 09/15/2000     Model #: [id number]     Serial #: [id number]     Status: capped Lead 2    Location: RA     DOI: 09/15/2000     Model #: [id number]     Serial #: [id number]     Status: active Lead 3    Location: RV     DOI: 03/13/2009     Model #: [id number]     Serial #: [id number]     Status: active  Magnet Response Rate:  BOL 85 ERI  65  Indications:  Tachy-brady syndrome  Explantation Comments:  03/13/2009  Kappa 401 explanted  PPM Follow Up Remote Check?  No Battery Voltage:  2.80 V     Battery Est. Longevity:  14.5 years     Pacer Dependent:  No     Right Ventricle  Amplitude: 11.2 mV, Impedance: 720 ohms, Threshold: 1.0 V at 0.4 msec  Episodes Coumadin:  Yes Ventricular High Rate:  102     Ventricular Pacing:  64.3%  Parameters Mode:  VVIR     Lower Rate Limit:  60     Upper Rate Limit:  130 Paced AV Delay:  230     Sensed AV Delay:  160 Next Cardiology Appt Due:  06/30/2010 Tech Comments:  102 VHR episodes the longest 1:25 minutes.  A-fib, + coumadin.  ROV 6 months clinic. Altha Harm, LPN  January 16, 2010 12:38 PM   Impression & Recommendations:  Problem # 1:   FIBRILLATION, ATRIAL (ICD-427.31) Her atrial fibrillation is permanent. Review of her heart rate histograms demonstrate about 6-7% of her heartbeats or faster than 100 beats per minute. This is outstanding rate control. Her updated medication list for this problem includes:    Coumadin 5 Mg Tabs (Warfarin sodium) .Marland Kitchen... Take as directed by coumadin clinic    Carvedilol 12.5 Mg Tabs (Carvedilol) ..... One half by mouth two times a day    Digoxin 0.25 Mg Tabs (Digoxin) .Marland Kitchen... Take one tablet by mouth daily  Metoprolol Tartrate 50 Mg Tabs (Metoprolol tartrate) .Marland Kitchen... Take 1 tablet two times a day  Problem # 2:  CARDIAC PACEMAKER IN SITU-MEDTRONIC ADAPTA L (ICD-V45.01) Device parameters and data were reviewed and no changes were made  she is paced at 70-80% in her lower rates. We will probably want to reassess her echo at the next visit. Consideration for CRT upgrade is appropriate depending on her LV function  Problem # 3:  DIASTOLIC HEART FAILURE, CHRONIC (ICD-428.32) this is much improved. Her ejection fraction has been variably measured at quite low, but most recently in the fall of 2009 her ejection fraction was 50-55%. If her ejection fraction remains good we'll plan to discontinue her digoxin Her updated medication list for this problem includes:    Furosemide 40 Mg Tabs (Furosemide) .Marland Kitchen... 1 tab morning 2 tabs eve    Coumadin 5 Mg Tabs (Warfarin sodium) .Marland Kitchen... Take as directed by coumadin clinic    Carvedilol 12.5 Mg Tabs (Carvedilol) ..... One half by mouth two times a day    Digoxin 0.25 Mg Tabs (Digoxin) .Marland Kitchen... Take one tablet by mouth daily    Metoprolol Tartrate 50 Mg Tabs (Metoprolol tartrate) .Marland Kitchen... Take 1 tablet two times a day    Cozaar 25 Mg Tabs (Losartan potassium) .Marland Kitchen... Take one tab at bedtime  Patient Instructions: 1)  Your physician recommends that you schedule a follow-up appointment in: next months. She'll need an echo at that time.

## 2010-11-01 NOTE — Medication Information (Signed)
Summary: rov/mlw  Anticoagulant Therapy  Managed by: Lynann Bologna, PharmD Referring MD: Charlton Haws MD PCP: Deri Fuelling Supervising MD: Excell Seltzer MD, Casimiro Needle Indication 1: Atrial Fibrillation (ICD-427.31) Lab Used: [facility] Site: [facility] INR POC 3.8 INR RANGE 3 - 3.5  Dietary changes: no    Health status changes: no    Bleeding/hemorrhagic complications: no    Recent/future hospitalizations: no    Any changes in medication regimen? no    Recent/future dental: no  Any missed doses?: no       Is patient compliant with meds? yes       Current Medications (verified): 1)  Advair Diskus 500-50 Mcg/dose  Misc (Fluticasone-Salmeterol) .... Inhale 1 Puff Two Times A Day 2)  Furosemide 40 Mg Tabs (Furosemide) .Marland Kitchen.. 1 Tab Morning 2 Tabs Eve 3)  Lipitor 80 Mg Tabs (Atorvastatin Calcium) .Marland Kitchen.. 1 Tab in The Evening 4)  Coumadin 5 Mg  Tabs (Warfarin Sodium) .... Take As Directed By Coumadin Clinic 5)  Synthroid 25 Mcg  Tabs (Levothyroxine Sodium) .... Take 1 Tablet By Mouth Once A Day 6)  Carvedilol 12.5 Mg Tabs (Carvedilol) .... One Half By Mouth Two Times A Day 7)  Omeprazole 20 Mg Cpdr (Omeprazole) .Marland Kitchen.. 1 Tab Every Morning 8)  Spiriva Handihaler 18 Mcg  Caps (Tiotropium Bromide Monohydrate) .... One Puffs in Handihaler Daily-Pt Not Using As Much 9)  Vitamin D 2000 Unit Tabs (Cholecalciferol) .... Once Daily 10)  Fosamax 70 Mg Tabs (Alendronate Sodium) .... Take One By Mouth Every Week 11)  Klor-Con 20 Meq Pack (Potassium Chloride) .... One By Mouth Daily 12)  Digoxin 0.25 Mg Tabs (Digoxin) .... Take One Tablet By Mouth Daily 13)  Metoprolol Tartrate 50 Mg Tabs (Metoprolol Tartrate) .... Take 1 Tablet Two Times A Day 14)  Allopurinol 100 Mg Tabs (Allopurinol) .... Take One Tablet By Mouth Daily 15)  Sulfasalazine 500 Mg Tabs (Sulfasalazine) .... Two Tablets Two Times A Day 16)  Cozaar 25 Mg Tabs (Losartan Potassium) .... Take One Tab At Bedtime 17)  Lantus 100 Unit/ml Soln  (Insulin Glargine) .... 30 Units Sub-Q Every Am 18)  Novolog Flexpen 100 Unit/ml Soln (Insulin Aspart) .Marland Kitchen.. 10 Units Sub-Q  Before Meals or 2 To 3 Times Daily  Allergies (verified): No Known Drug Allergies  Anticoagulation Management History:      Positive risk factors for bleeding include an age of 32 years or older.  The bleeding index is 'intermediate risk'.  Positive CHADS2 values include History of CHF and History of HTN.  Negative CHADS2 values include Age > 70 years old.  The start date was 08/06/2006.  Her last INR was 2.4 ratio.  Anticoagulation responsible provider: Excell Seltzer MD, Casimiro Needle.  INR POC: 3.8.  Exp: 01/2011.    Anticoagulation Management Assessment/Plan:      The patient's current anticoagulation dose is Coumadin 5 mg  tabs: take as directed by coumadin clinic.  The target INR is 3 - 3.5.  The next INR is due 02/12/2010.  Anticoagulation instructions were given to patient.  Results were reviewed/authorized by Lynann Bologna, PharmD.         Prior Anticoagulation Instructions: INR 3.9  The patient is to hold one dose of coumadin.  The dosage to be resumed includes: 1.5 tab daily (7.5 mg) except 2 tabs on Tuesdays and Saturdays.   Next INR in 3 weeks on Monday, April 25th at 2:15 pm.    Current Anticoagulation Instructions: INR 3.8  No coumadin today. Then take 1.5 tablets  daily, except take 2 tablets on Saturdays.   Next appointment on May 18th at 2:15 pm.

## 2010-11-01 NOTE — Medication Information (Signed)
Summary: rov.mp  Anticoagulant Therapy  Managed by: Lynann Bologna, PharmD Referring MD: Charlton Haws MD PCP: Deri Fuelling Supervising MD: Clifton James MD, Cristal Deer Indication 1: Atrial Fibrillation (ICD-427.31) Lab Used: [facility] Site: [facility] INR POC 3.9 INR RANGE 3 - 3.5  Dietary changes: no    Health status changes: no    Bleeding/hemorrhagic complications: no    Recent/future hospitalizations: no    Any changes in medication regimen? no    Recent/future dental: no  Any missed doses?: no       Is patient compliant with meds? yes       Current Medications (verified): 1)  Advair Diskus 500-50 Mcg/dose  Misc (Fluticasone-Salmeterol) .... Inhale 1 Puff Two Times A Day 2)  Furosemide 40 Mg Tabs (Furosemide) .Marland Kitchen.. 1 Tab Morning 2 Tabs Eve 3)  Lipitor 40 Mg Tabs (Atorvastatin Calcium) .... Take One By Mouth Once Daily 4)  Coumadin 5 Mg  Tabs (Warfarin Sodium) .... Take As Directed By Coumadin Clinic 5)  Synthroid 25 Mcg  Tabs (Levothyroxine Sodium) .... Take 1 Tablet By Mouth Once A Day 6)  Carvedilol 12.5 Mg Tabs (Carvedilol) .... One Half By Mouth Two Times A Day 7)  Omeprazole 20 Mg Cpdr (Omeprazole) .Marland Kitchen.. 1 Tab Every Morning 8)  Spiriva Handihaler 18 Mcg  Caps (Tiotropium Bromide Monohydrate) .... One Puffs in Handihaler Daily 9)  Vitamin D 1000 Unit Tabs (Cholecalciferol) .... Take On By Mouth Once Daily 10)  Fosamax 70 Mg Tabs (Alendronate Sodium) .... Take One By Mouth Every Week 11)  Klor-Con 20 Meq Pack (Potassium Chloride) .... One By Mouth Daily 12)  Digoxin 0.25 Mg Tabs (Digoxin) .... Take One Tablet By Mouth Daily 13)  Metoprolol Tartrate 50 Mg Tabs (Metoprolol Tartrate) .... One Half By Mouth Two Times A Day 14)  Allopurinol 100 Mg Tabs (Allopurinol) .... Take One Tablet By Mouth Daily 15)  Sulfasalazine 500 Mg Tabs (Sulfasalazine) .... Two Tablets Two Times A Day 16)  Cozaar 25 Mg Tabs (Losartan Potassium) .... Take One Tab At Bedtime 17)  Lantus 100  Unit/ml Soln (Insulin Glargine) .... 30 Units Sub-Q Every Am 18)  Novolog Flexpen 100 Unit/ml Soln (Insulin Aspart) .Marland Kitchen.. 10 Units Sub-Q  Before Meals  Allergies (verified): No Known Drug Allergies  Anticoagulation Management History:      The patient is taking warfarin and comes in today for a routine follow up visit.  Positive risk factors for bleeding include an age of 71 years or older.  The bleeding index is 'intermediate risk'.  Positive CHADS2 values include History of CHF and History of HTN.  Negative CHADS2 values include Age > 27 years old.  The start date was 08/06/2006.  Her last INR was 2.4 ratio.  Anticoagulation responsible provider: Clifton James MD, Cristal Deer.  INR POC: 3.9.  Cuvette Lot#: [id number].  Exp: 01/2011.    Anticoagulation Management Assessment/Plan:      The patient's current anticoagulation dose is Coumadin 5 mg  tabs: take as directed by coumadin clinic.  The target INR is 3 - 3.5.  The next INR is due 01/22/2010.  Anticoagulation instructions were given to patient.  Results were reviewed/authorized by Lynann Bologna, PharmD.  She was notified by Lynann Bologna.         Prior Anticoagulation Instructions: INR 3  Continue 2 tabs each Tuesday and Saturday and 1.5 tabs daily.    Recheck in 4 weeks.    Current Anticoagulation Instructions: INR 3.9  The patient is to hold one dose of  coumadin.  The dosage to be resumed includes: 1.5 tab daily (7.5 mg) except 2 tabs on Tuesdays and Saturdays.   Next INR in 3 weeks on Monday, April 25th at 2:15 pm.

## 2010-11-09 ENCOUNTER — Encounter: Payer: Self-pay | Admitting: Cardiology

## 2010-11-15 NOTE — Medication Information (Signed)
Summary: Coumadin Clinic  Anticoagulant Therapy  Managed by: Cloyde Reams, RN, BSN Referring MD: Charlton Haws MD PCP: Deri Fuelling Supervising MD: Daleen Squibb MD, Maisie Fus Indication 1: Atrial Fibrillation (ICD-427.31) Lab Used: [facility] Site: [facility] PT 35.7 INR POC 3.48 INR RANGE 3 - 3.5  Dietary changes: no     Bleeding/hemorrhagic complications: no     Any changes in medication regimen? no     Any missed doses?: no       Is patient compliant with meds? yes       Allergies: No Known Drug Allergies  Anticoagulation Management History:      Her anticoagulation is being managed by telephone today.  Positive risk factors for bleeding include an age of 71 years or older.  The bleeding index is 'intermediate risk'.  Positive CHADS2 values include History of CHF and History of HTN.  Negative CHADS2 values include Age > 46 years old.  The start date was 08/06/2006.  Her last INR was 2.4 ratio.  Prothrombin time is 35.7.  Anticoagulation responsible provider: Daleen Squibb MD, Maisie Fus.  INR POC: 3.48.    Anticoagulation Management Assessment/Plan:      The patient's current anticoagulation dose is Coumadin 5 mg  tabs: take as directed by coumadin clinic.  The target INR is 3 - 3.5.  The next INR is due 12/07/2010.  Anticoagulation instructions were given to patient.  Results were reviewed/authorized by Cloyde Reams, RN, BSN.  She was notified by Cloyde Reams RN.         Prior Anticoagulation Instructions: INR 2.6  Take 2 tablets today then resume same dose of 1 1/2 tablets every day except 2 tablets on Tuesday, Thursday and Saturday.  Recheck INR in 3 weeks.   Current Anticoagulation Instructions: INR 3.48 LMOM to call for dosing. Bethena Midget, RN, BSN  November 09, 2010 9:53 AM [facility] to call for dosing. Bethena Midget, RN, BSN  November 09, 2010 11:34 AM Called spoke with pt advised to Continue taking 1.5 tablets everyday, except 2 tablets on Tuesdays, Thursdays, and Saturdays. Recheck  in 4 weeks.

## 2010-12-04 ENCOUNTER — Encounter: Payer: Self-pay | Admitting: Cardiovascular Disease

## 2010-12-04 DIAGNOSIS — I4891 Unspecified atrial fibrillation: Secondary | ICD-10-CM

## 2010-12-07 ENCOUNTER — Telehealth (INDEPENDENT_AMBULATORY_CARE_PROVIDER_SITE_OTHER): Payer: Self-pay | Admitting: *Deleted

## 2010-12-11 NOTE — Progress Notes (Signed)
Summary: Pt. will call clinic to have INR drawn   Phone Note Outgoing Call   Call placed by: Windell Hummingbird, RN Call placed to: Patient Details for Reason: Called to see if pt. had INR drawn today Summary of Call: Called pt. to see if she had gotten her INR drawn today from [place].  Pt. said that she was returning to [facility] next week and will call us to set up appt. to come into clinic then.   Initial call taken by: Windell Hummingbird,  December 07, 2010 4:56 PM

## 2011-01-07 LAB — GLUCOSE, CAPILLARY
Glucose-Capillary: 202 mg/dL — ABNORMAL HIGH (ref 70–99)
Glucose-Capillary: 252 mg/dL — ABNORMAL HIGH (ref 70–99)
Glucose-Capillary: 304 mg/dL — ABNORMAL HIGH (ref 70–99)
Glucose-Capillary: 320 mg/dL — ABNORMAL HIGH (ref 70–99)
Glucose-Capillary: 69 mg/dL — ABNORMAL LOW (ref 70–99)

## 2011-01-09 ENCOUNTER — Other Ambulatory Visit: Payer: Self-pay | Admitting: Internal Medicine

## 2011-01-09 DIAGNOSIS — I4891 Unspecified atrial fibrillation: Secondary | ICD-10-CM

## 2011-01-11 ENCOUNTER — Ambulatory Visit (INDEPENDENT_AMBULATORY_CARE_PROVIDER_SITE_OTHER): Payer: Medicare Other | Admitting: *Deleted

## 2011-01-11 ENCOUNTER — Telehealth: Payer: Self-pay | Admitting: Internal Medicine

## 2011-01-11 DIAGNOSIS — I4891 Unspecified atrial fibrillation: Secondary | ICD-10-CM

## 2011-01-11 DIAGNOSIS — Z7901 Long term (current) use of anticoagulants: Secondary | ICD-10-CM | POA: Insufficient documentation

## 2011-01-11 LAB — POCT INR: INR: 2.6

## 2011-01-11 NOTE — Telephone Encounter (Signed)
Pt having eye surgery next Tuesday. Pt has question re her coumadin pt needs to know if she needs to stop her meds.

## 2011-01-11 NOTE — Telephone Encounter (Signed)
PT AWARE WILL  DISCUSS WITH DR Graciela Husbands THIS AFTERNOON EYE DR IS WILLING TO DO PROCEDURE WITH PT TAKING COUMADIN .[name]

## 2011-01-11 NOTE — Telephone Encounter (Signed)
SEE OTHER PHONE NOTE./CY 

## 2011-01-30 NOTE — Telephone Encounter (Signed)
LMTCB ./CY 

## 2011-02-01 ENCOUNTER — Telehealth: Payer: Self-pay | Admitting: *Deleted

## 2011-02-01 NOTE — Telephone Encounter (Signed)
Pt rtn call to christine from yesterday you can reach her at [phone]

## 2011-02-01 NOTE — Telephone Encounter (Signed)
Message left for dr [name] by c york,but i am not able to find exactly what the message or result is--i spoke to pt and she is not aware of any problems or testing--nt

## 2011-02-04 NOTE — Telephone Encounter (Signed)
HAD RECENT EYE SURGERY [name]

## 2011-02-07 ENCOUNTER — Ambulatory Visit (INDEPENDENT_AMBULATORY_CARE_PROVIDER_SITE_OTHER): Payer: Medicare Other | Admitting: *Deleted

## 2011-02-07 DIAGNOSIS — I4891 Unspecified atrial fibrillation: Secondary | ICD-10-CM

## 2011-02-07 LAB — POCT INR: INR: 2.9

## 2011-02-08 ENCOUNTER — Encounter: Payer: Self-pay | Admitting: Internal Medicine

## 2011-02-11 ENCOUNTER — Encounter: Payer: Self-pay | Admitting: Internal Medicine

## 2011-02-11 ENCOUNTER — Ambulatory Visit (INDEPENDENT_AMBULATORY_CARE_PROVIDER_SITE_OTHER): Payer: Medicare Other | Admitting: Internal Medicine

## 2011-02-11 VITALS — BP 123/78 | HR 83 | Ht 68.0 in | Wt 300.0 lb

## 2011-02-11 DIAGNOSIS — I4891 Unspecified atrial fibrillation: Secondary | ICD-10-CM

## 2011-02-11 DIAGNOSIS — I5032 Chronic diastolic (congestive) heart failure: Secondary | ICD-10-CM

## 2011-02-11 DIAGNOSIS — I509 Heart failure, unspecified: Secondary | ICD-10-CM

## 2011-02-11 MED ORDER — METOPROLOL TARTRATE 50 MG PO TABS: ORAL_TABLET | ORAL | Status: DC

## 2011-02-11 NOTE — Patient Instructions (Signed)
Your physician has recommended you make the following change in your medication:  1) Stop Carvediolol (Coreg). 2) Increase metoprolol to 50mg  1 & 1/2 tablets twice daily.  Your physician wants you to follow-up in: 6 months. You will receive a reminder letter in the mail two months in advance. If you don't receive a letter, please call our office to schedule the follow-up appointment.

## 2011-02-11 NOTE — Assessment & Plan Note (Signed)
unmfprtuinatlery she has lost much of her gain

## 2011-02-11 NOTE — Assessment & Plan Note (Addendum)
Permanent atrial fibrillation with reasonable rate control. With normalization of LV function we will stop her carvedilol and increase her Lopressor. copntineu Warfarin

## 2011-02-11 NOTE — Assessment & Plan Note (Signed)
somehwat worse, altohuogh some of it may be related to intercurrent weight gain

## 2011-02-11 NOTE — Assessment & Plan Note (Signed)
The patient's device was interrogated.  The information was reviewed. No changes were made in the programming.    

## 2011-02-11 NOTE — Progress Notes (Signed)
HPI  [name] is a 70 y.o. female   seen in followup for atrial fibrillation with a somewhat rapid ventricular response. she is status post Pulm Vein isolation  at [facility] 2009. We have been using beta blockers to control the rate with good success of late.She has continued to do marvelously with ongoing 30 pound weight loss over the last 6 months now 60 pounds over the last year. This was   associated with a significant improvement in her exercise tolerance fatigue and shortness of breath. Unfortunately she has put on 30 pounds again. Her diabetes has been interesting in that her insulin requirements have decreased. Her hemoglobin A1c was 5.8. She is talked with her endocrinologist as to what to do with her further insulin. Her subsequent ejection fraction is teslas October was normal  She has minimal peripheral edema.  Past Medical History  Diagnosis Date  . Nonischemic cardiomyopathy     history of,  EF 20-25% at left heart cath in 06/2007  . CHF (congestive heart failure)     history of decompensated CHF with hospitalizations in 05/2007 and 03/2008  . Morbid obesity   . Hyperlipidemia   . Tachy-brady syndrome     status post implant of a medtronic dual-chamber pacemaker in 2001.  explanted in 2010 -- medtronic ADAPTA L dual-chamber pacemaker in 2010.  Marland Kitchen Chronic obstructive pulmonary disease   . Osteoporosis   . Depression   . Obstructive sleep apnea     continuous positive airway pressure  . GERD (gastroesophageal reflux disease)   . Crohn's disease   . Hypothyroidism     treated  . Seasonal allergies     Past Surgical History  Procedure Date  . Pulmonary vein isolation 05/12/2008    RFCA atrial fibrillation  . Post ablation pseudoaneurysm     at A fib ablation  . Cardiac catheterization 08/2000    noncritical disease mid RCA, EF preserved  . Cardiac catheterization 05/29/2007    noncritical disease, EF 20-25%  . Medtronic adapta  2010    dual chamber  pacemaker implanted     Current Outpatient Prescriptions  Medication Sig Dispense Refill  . allopurinol (ZYLOPRIM) 100 MG tablet Take 100 mg by mouth daily.        Marland Kitchen atorvastatin (LIPITOR) 80 MG tablet Take 80 mg by mouth at bedtime.        . B-D INS SYRINGE 0.5CC/31GX5/16 31G X 5/16" 0.5 ML MISC as directed.      Marland Kitchen BONIVA 150 MG tablet Take 1 tablet by mouth Every month.      . carvedilol (COREG) 12.5 MG tablet Take 6.25 mg by mouth 2 (two) times daily with a meal.        . Cholecalciferol (VITAMIN D) 2000 UNITS CAPS Take 2,000 Units by mouth daily.        . Fluticasone-Salmeterol (ADVAIR DISKUS) 500-50 MCG/DOSE AEPB Inhale 1 puff into the lungs every 12 (twelve) hours.        Marland Kitchen FREESTYLE LITE test strip as directed.      . furosemide (LASIX) 40 MG tablet 1 tab in the AM, 2 tabs in the PM      . insulin glargine (LANTUS) 100 UNIT/ML injection Inject 34 Units into the skin every morning.        . Lancets (FREESTYLE) lancets as directed.      Marland Kitchen levothyroxine (SYNTHROID, LEVOTHROID) 25 MCG tablet Take 25 mcg by mouth daily.        Marland Kitchen  loratadine (CLARITIN) 10 MG tablet Take 10 mg by mouth as needed.        Marland Kitchen losartan (COZAAR) 25 MG tablet Take 25 mg by mouth at bedtime.        . metoprolol (LOPRESSOR) 50 MG tablet Take 50 mg by mouth 2 (two) times daily.        Marland Kitchen omeprazole (PRILOSEC OTC) 20 MG tablet Take 20 mg by mouth daily.        . potassium chloride SA (K-DUR,KLOR-CON) 20 MEQ tablet Take 20 mEq by mouth daily.        Marland Kitchen PRED FORTE 1 % ophthalmic suspension daily.      Marland Kitchen sulfaSALAzine (AZULFIDINE) 500 MG tablet Take 1,000 mg by mouth 2 (two) times daily.       Marland Kitchen warfarin (COUMADIN) 5 MG tablet TAKE AS DIRECTED BY COUMADIN CLINIC  180 tablet  0  . DISCONTD: potassium chloride (KLOR-CON) 20 MEQ packet Take 20 mEq by mouth daily.        Marland Kitchen DISCONTD: alendronate (FOSAMAX) 70 MG tablet Take 70 mg by mouth every 7 (seven) days. Take with a full glass of water on an empty stomach.       .  DISCONTD: insulin aspart (NOVOLOG FLEXPEN) 100 UNIT/ML injection Inject 10 Units into the skin 3 (three) times daily before meals.          No Known Allergies  Review of Systems negative except from HPI and PMH  Physical Exam Well developed and well nourished in no acute distress HENT normal E scleral and icterus clear Neck Supple JVP flat; carotids brisk and full Clear to ausculation Regular rate and rhythm, no murmurs gallops or rub Soft with active bowel sounds No clubbing cyanosis and edema Alert and oriented, grossly normal motor and sensory function Skin Warm and Dry  ECG  Assessment and  Plan

## 2011-02-12 NOTE — Assessment & Plan Note (Signed)
[facility]                         ELECTROPHYSIOLOGY OFFICE NOTE   NAME:[name], [name] MACAPAGAL                    MRN:          [id number]  DATE:12/08/2007                            DOB:          29-Feb-1941    [name] [name] comes in today with ongoing problems with atrial  fibrillation.  She is having problems with progressive dyspnea as well  as some peripheral edema.  Interrogation of her of her pacemaker  demonstrates a progressively poorly controlled ventricular response.   When I last saw her in November, she was scheduled to go to [facility] for  pulmonary vein isolation procedure.  At [facility], she was found to have a  clot, and it was recommended her INR be pushed greater than 3, and this  has finally been achieved now about 10 weeks later.   She is also concerned about her sleep study, the results of which were  reviewed by Dr. Myrtis Ser with her, but she does not seem to recall exactly  what the recommendations were.   CURRENT MEDICATIONS:  1. Advair.  2. Lasix.  3. Lipitor.  4. Coumadin.  5. Sulfasalazine.  6. Synthroid 25 mcg.  7. Coreg 25 b.i.d.  8. Toprol 50.  9. Pepcid.  10.We added beta blocker for augmented rate control.   PHYSICAL EXAMINATION:  VITAL SIGNS:  Her blood pressure is 119/79 with a  pulse of 81.  LUNGS:  Clear.  CARDIOVASCULAR:  Neck veins were 7-8 cm. Heart sounds were irregular and  rapid.  ABDOMEN:  Protuberant and soft.  EXTREMITIES:  1+ edema, and there was some rubor in the left leg.   Interrogation of her pace maker demonstrated a 35% versus 30% for her  heart beast are now faster than 100.  Her R wave was 8 with impedance of  649, threshold of 0.5 at 0.4.  She is ventricularly paced about 30% of  the time.   IMPRESSION:  1. Atrial fibrillation with a poorly controlled ventricular response.  2. Left atrial appendage clot on increasing dose of Coumadin with      pending but currently unscheduled pulmonary vein  isolation      procedure.  3. Increasing exercise intolerance, question related to #1.  4. Peripheral edema, question related to #1.  5. Question of obstructive sleep apnea.   [name] [name] has a series of concerns today on which we spent quite a  time trying to address and to and direct towards addressing.  The first  was her feeling that her urine frequency with increasing but not  changing with associated volume.  We have sent a UA in this regard.   I have also increased her Lasix from 20-40 mg a day in conjunction with  a potassium for one weeks' time to try to augment diuresis as well as to  hopefully have an impact on her dyspnea.   Interrogation of a pacemaker demonstrated inadequate rate control as  noted, and we have increased her Toprol from 50 to 100 mg a day.   Will have also called Dr. Shelle Iron to have him follow up with her about  her  sleep study, and I spoke with him today about that.   We will schedule her to be seen in three months' time, and hopefully she  will have had her pulmonary vein procedure by then.     [name], MD, [facility]  Electronically Signed    SCK/MedQ  DD: 12/08/2007  DT: 12/09/2007  Job #: [id number]   cc:   Oley Balm. Georgina Pillion, M.D.

## 2011-02-12 NOTE — Letter (Signed)
August 10, 2007    Mick Sell, MD  [address] [place], [place] [address]   RE:  CHAI, VERDEJO  MRN:  [id number]  /  DOB:  [birth year]   Dear [name]:   [name] is to see you for a pulmonary vein isolation procedure  in early December.  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  She diuresed about  20-25 pounds and has done really quite well.   We cardioverted her again about 3 weeks ago on amiodarone; she failed to  hold sinus rhythm and has been in atrial fibrillation probably for the  majority of the time since then.  Her ventricular response has also been  quite rapid, with greater than 30% of her beats faster than 100 beats  per minute.   Because of that, I have chosen to discontinue her amiodarone, and I have  added Toprol-XL 50 mg a day to her Coreg to get augmented rate response  in the time preceding her procedure so as to hopefully prevent the  development of heart failure intercurrently.   On examination today, her blood pressure was pretty good at 144/86.  Her  pulse was 64 and irregular.  The extremities had 1+ peripheral edema.   Her Medtronic device is programmed in the DDDR mode.  R wave excursion  was noted previously.   I look forward to hearing from you after her procedure in December.   I hope you and your family have a wonderful Thanksgiving.    Sincerely,      [name], MD, [facility]  Electronically Signed    SCK/MedQ  DD: 08/10/2007  DT: 08/10/2007  Job #: [id number]

## 2011-02-12 NOTE — Op Note (Signed)
NAMEAMBREE, [name]             ACCOUNT NO.:  [id number]   MEDICAL RECORD NO.:  [id number]          PATIENT TYPE:  OIB   LOCATION:  2899                         FACILITY:  [facility]   PHYSICIAN:  [name], MD, FACCDATE OF BIRTH:  [birth year]   DATE OF PROCEDURE:  07/16/2007  DATE OF DISCHARGE:  07/16/2007                               OPERATIVE REPORT   PREOPERATIVE DIAGNOSIS:  Atrial fibrillation.   POSTOPERATIVE DIAGNOSIS:  Sinus rhythm.   PROCEDURE:  DC cardioversion.   Following obtaining informed consent, the patient was submitted to  general anesthesia by Dr. Sharee Holster.  She received 300 mg of  intravenous Pentothal.  A single 120 joule shock was delivered  synchronously and atrial fibrillation restoring an AV paced rhythm.   The patient tolerated the procedure without apparent complication.      [name], MD, [facility]  Electronically Signed     SCK/MEDQ  D:  07/16/2007  T:  07/17/2007  Job:  [id number]

## 2011-02-12 NOTE — Letter (Signed)
August 12, 2008    Clydie Braun, MD  [facility] [place], [place]  [address]   RE:  [name], [name]  MRN:  [id number]  /  DOB:  1941/07/17   Dear [name],   [name] comes in today following cardioversion about 3-1/2 weeks  ago accomplished on a couple of months load of amiodarone.  Unfortunately, she reverted to atrial fibrillation very shortly after  the cardioversion and is in atrial fibrillation still.  Her ventricular  response remains somewhat fast with 15% of her beats were so faster than  100 beats per minute on the combinations of amiodarone, metoprolol,  carvedilol.  She is having some problems with shortness of breath.  Her  weight is relatively stable.   Her other medications include:  1. Lasix 40 b.i.d.  2. Levothyroxine 25.  3. Omeprazole 20.  4. Sulfasalazine.  5. Spiriva 18.  6. Loratadine.  7. Potassium.   On examination, her weight was 284 which is up a couple of pounds, it  represents 6 pounds since September.  Her blood pressure is 142/84, her  pulse is 80 and irregular.  Her lungs were clear.  Heart sounds were  irregular as noted.  The extremities had 1+ edema.   Interrogation of Medtronic Kappa pulse generator demonstrates the  aforementioned results.   IMPRESSION:  1. Atrial fibrillation - persistent status post RF ablation.  2. Diastolic congestive heart failure, ejection fraction 55%, failure      of amiodarone.   [name], Dr. Bertis Ruddy heart rates are a little bit slower with only 15%  of her heart beats faster than 100 rather than the 35% we saw earlier.  This may be a functiof n aoccumulation of amiodarone.  However, I am not  inclined to continue long term her amiodarone for rate control. My  thoughts are we have 3 options, the first would be to try Tikosyn in the  wake of her pulmonary vein isolation procedure and see whether that does  betternow that the substate has  been modifiedm , AV junction ablation  and pacing, and consideration of a repeat pulmonary vein procedure.  I  will refer her to talk with you about her and see what your thoughts  are.  As I mentioned to you before, I am not a big fan of dronedarone at  this point with the ANDROMEDA trial sitting in the closet of ATHENA.   Thanks very much for all your help as always.  Merry Christmas     Sincerely,      [name], MD, [facility]  Electronically Signed    SCK/MedQ  DD: 08/12/2008  DT: 08/13/2008  Job #: [id number]

## 2011-02-12 NOTE — Discharge Summary (Signed)
NAMEPEREL, HAUSCHILD             ACCOUNT NO.:  [id number]   MEDICAL RECORD NO.:  [id number]          PATIENT TYPE:  INP   LOCATION:  4713                         FACILITY:  [facility]   PHYSICIAN:  [name], MD, FACCDATE OF BIRTH:  1940/11/14   DATE OF ADMISSION:  05/17/2008  DATE OF DISCHARGE:  05/20/2008                               DISCHARGE SUMMARY   ALLERGIES:  This patient has no known drug allergies.   TIME FOR EXAMINATION AND DICTATION AND PREPARATION:  Greater than 45  minutes.   FINAL DIAGNOSES:  1. Admitted with acute diastolic New York Heart Association class IIIB      congestive heart failure.  2. History of atrial fibrillation.      a.     Failed antiarrhythmic therapy in the past including Tikosyn,       amiodarone, flecainide, and Rythmol.  Her atrial fibrillation has       proved refractory to all those.      b.     Pulmonary vein isolation procedure 8 days ago on May 12, 2008.      c.     Currently atrial fibrillation, controlled ventricular rate.  3. Left groin hematoma status post pulmonary vein isolation procedure.      Hemoglobin this admission for the past 72 hours has been stable.  4. Thrombophlebitis of the right arm after IV amiodarone infiltration.      Continue treatment with IV Ancef this admission and then to      continue her home Keflex dose until she runs her course out.  5. Echocardiogram, May 17, 2008, ejection fraction 50-55%.  6. Elevated D-dimer.  Computed tomogram of the chest, May 18, 2008,      no pulmonary embolism.  There is cardiomegaly.  There is no      pericardial effusion.  There is a scar in the right middle lobe      lingula and left lower lobe.  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   SECONDARY DIAGNOSES:  1. History of nonischemic cardiomyopathy, ejection fraction 20-25% at      cath, September 2008.  2. History of decompensated congestive heart failure with      hospitalizations, August 2008  and July 2009.  3. Morbid obesity.  4. Hyperlipidemia.  5. Hypertension.  6. Tachybrady syndrome status post implant of Medtronic dual-chamber      pacemaker in 2001.  7. Chronic obstructive pulmonary disease.  8. Osteoporosis.  9. Depression.  10.Obstructive sleep apnea/continuous positive airway pressure.  11.Gastroesophageal reflux disease.  12.Crohn disease.  13.Treated hypothyroidism.   PROCEDURES THIS ADMISSION:  1. 2-D echocardiogram, May 17, 2008, ejection fraction is 50-55%.  2. Computed tomogram of the chest, May 18, 2008 as dictated above      under final diagnoses.   BRIEF HISTORY:  Ms. [name] is a 70 year old female.  She underwent  atrial fibrillation ablation on Thursday, May 12, 2008.  It was a 6-  hour complicated procedure.  She did have a left groin hematoma.  She  had left arm amiodarone infiltrate with phlebitis.  She had active  urinary tract infection.   The patient has felt bloated, short of breath, and anorexic since the  procedure.  Her left groin is ecchymotic with diffuse extravasation from  the bleeding at the time of the procedure.   The patient has a history of acute on chronic congestive heart failure  hospitalizations, most probably diastolic congestive heart failure.  The  patient presents in atrial fibrillation with controlled ventricular  rate.  She is V pacing and has PVCs.  The plan is to admit the patient  with a diagnosis of acute on chronic New York Heart Association class  IIIB systolic/diastolic congestive heart failure.  She will have active  diuresis.  She will continue with IV antibiotics for her right arm  thrombophlebitis.  She will also undergo a repeat 2-D echocardiogram and  the study for D-dimer.   HOSPITAL COURSE:  The patient presents short of breath with presyncope.  On May 17, 2008, she underwent IV Lasix diuresis with modest weight  loss but with some clinical rebound in her breathing.  On the day of   discharge, she was achieving 94% oxygen saturations on room air.  She  has been treated with IV Ancef throughout this hospitalization.  Her  echocardiogram actually shows a rebound in ejection fraction from the  catheterization study of September 2008.  Her D-dimer was elevated but  the CT of the chest shows no evidence of pulmonary embolism.  However,  there is a 4.7 cm aneurysm in the ascending aorta.  The patient's  Protimes were low on admission.  Her Coumadin had been stopped for this  isolation procedure.  She was started on 10 mg Coumadin for the entire  course here.  She received 3 times 10 mg doses on May 17, 2008,  May 18, 2008, and May 19, 2008.  She will go home on her home  doses, however, now.  At the time of discharge, her diuretic has been up-  titrated and because of some hypotension her antihypertensive  medications have been down titrated.   DISCHARGE MEDICATIONS:  1. Lasix 40 mg twice daily.  This is a new dose.  2. Amiodarone 200 mg 1 tablet daily.  3. Coreg 12.5 mg twice daily.  4. Lipitor 40 mg daily at bedtime.  5. Lisinopril 5 mg daily.  A new dose down from 10 mg.  6. Metoprolol succinate 25 mg, a new dose down from 100 mg.  7. Coumadin 5 mg tablets one to one-half tablets Tuesday, Thursday,      Saturday, and 1 tablet Monday, Wednesday, Friday, and Sunday.  8. Levothyroxine 25 mcg daily.  9. Pantoprazole 40 mg daily.  10.Omeprazole 20 mg daily.  11.Sulfasalazine 500 mg tablets two in the morning and two in the      evening.  12.Spiriva 18 mcg per inhalation, 1 inhalation daily.  13.Advair 500/50 one puff twice daily.  14.Loratadine 10 mg daily.  15.Vitamin D daily.  16.Potassium chloride, a new medication 20 mEq twice daily to      accompany the increase in diuretic.  17.She will continue her Keflex 500 mg 8 hours for doses on Friday and      Saturday.   She follows up at [facility] on [address], [address] to see  Dr. Graciela Husbands on  June 01, 2008.  She will call the Coumadin Clinic at  [facility] for an appointment on Thursday, May 26, 2008.  I will fax  over a report of the Coumadin dosing and her home dose at the end of  this discharge.   LABORATORY STUDIES THIS ADMISSION:  On the day of discharge, her Protime  was 21.9 and INR 1.8.  Serum electrolytes on the day of discharge,  May 20, 2008, sodium 137, potassium 3.6, chloride 101, carbonate 29,  BUN is 15, creatinine 0.91, and glucose is 113.  Serial hemoglobins have  been taken.  On May 17, 2008, it was 11.8; May 18, 2008 was 12.3;  May 19, 2008 was 12.2.  BNP on admission, May 17, 2008 was 400.  Alkaline phosphatase this admission was 77, SGOT was 16, and SGPT was  17.      Maple Mirza, [place]      [name], MD, [facility]  Electronically Signed    GM/MEDQ  D:  05/20/2008  T:  05/21/2008  Job:  [id number]   cc:   Oley Balm. Georgina Pillion, M.D.  Mick Sell, MD  [name], MD, [facility]

## 2011-02-12 NOTE — Cardiovascular Report (Signed)
NAMEROBERTO, HLAVATY             ACCOUNT NO.:  [id number]   MEDICAL RECORD NO.:  [id number]          PATIENT TYPE:  INP   LOCATION:  3733                         FACILITY:  [facility]   PHYSICIAN:  Rollene Rotunda, MD, FACCDATE OF BIRTH:  03-05-1941   DATE OF PROCEDURE:  05/29/2007  DATE OF DISCHARGE:                            CARDIAC CATHETERIZATION   PRIMARY CARE PHYSICIAN:  Oley Balm. Georgina Pillion, M.D.  Cardiologist, [name], MD, [facility].   PROCEDURE:  Left and right heart catheterization with coronary  arteriography.   INDICATIONS:  Evaluate patient with cardiomyopathy progressive.   PROCEDURE NOTE:  Left heart catheterization was performed via right  femoral artery.  Right heart catheterization was performed via the right  femoral vein.  Both vessels were cannulated using anterior wall  puncture.  A #6-French arterial sheath and #7-French venous sheath were  inserted via the Seldinger technique.  Preformed Judkins and a pigtail  catheter were utilized.  The patient tolerated the procedure well and  left the lab in stable condition.   HEMODYNAMICS:  LV 160/27, AO 154/97.  The RA had a mean of 25.  RV was  62/17.  The pulmonary capillary wedge pressure was a mean of 35.  Cardiac output/cardiac index (Fick) 3.1/1.24.   Coronaries of the left main was normal.  The LAD had proximal long 70-  80% stenosis before a large diagonal.  The first diagonal was large and  branching with diffuse luminal irregularities.  Circumflex in the AV  groove had diffuse luminal irregularities.  There was a mid obtuse  marginal which was large and branching with diffuse luminal  irregularities.  The right coronary artery was a large dominant vessel  with a long proximal 40% stenosis.  There were diffuse luminal  irregularities.  PDA was large and normal.  Posterolateral was large and  normal.  Left ventriculogram was 20-25% with global hypokinesis.   CONCLUSION:  Severe global left ventricular  dysfunction out of the  portion to the degree of coronary disease.  There is an LAD lesion that  does not appear to be obstructive, but could be evaluated with a  Cardiolite.  However, this would not explain her global left ventricular  dysfunction.  She has elevated right-sided heart pressures and some mild  evidence of restrictive physiology.   PLAN:  I have discussed this with Dr. Graciela Husbands.  She is going to need  definitive therapy for her atrial fibrillation with rapid rate.  She  most likely would benefit from biventricular pacing symptomatically.      Rollene Rotunda, MD, [facility]  Electronically Signed    JH/MEDQ  D:  05/29/2007  T:  05/30/2007  Job:  [id number]   cc:   Oley Balm. Georgina Pillion, M.D.

## 2011-02-12 NOTE — Assessment & Plan Note (Signed)
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  We have for the last 8 months been working on trying to  get her to [facility] for pulmonary vein isolation because of ongoing  symptoms related to her atrial fibrillation that has been refractory to  Tikosyn and amiodarone, as well as flecainide.  She has had problems  with some degree of LV dysfunction with ejection fraction having  measured from 40%-60%.  Most recently 55% in January 2008.   She has a history of bradycardia, is status post Medtronic catheter  pacemaker insertion.  Unfortunately, her referrals to [facility] for  pulmonary vein isolation have been hijacked on a couple of occasions.  In November, she had a left atrial appendage thrombus and rescheduling  in the spring of this year has also been interrupted.  I do not have the  details of that available to me here, but phone calls have not been  returned from [facility] and at this point, she would like to not pursue  further therapies there.   CURRENT MEDICATIONS:  1. Lisinopril 40.  2. Coumadin.  3. Sulfasalazine.  4. Synthroid 25 mcg.  5. Toprol 100.  6. Prilosec.  7. Lipitor.   Her INRs have been running mostly in the 2s and 3s.  It was 1.9 on March 03, 2008.   PHYSICAL EXAMINATION:  VITAL SIGNS:  Today, her blood pressure was  106/82.  Her pulse was 92.  Her heart rate was irregular.  GENERAL:  Her weight was 278, which is down a little bit from a year or  two ago, but fairly stable over the last year.  LUNGS:  Her lungs were clear.  There was 1+ peripheral edema.   She is also intercurrently undergone a sleep study.  Clinical  correlation, which was recommended.  A study was done in March  2008 and  I will need to check to see whether she ever had pulmonary consultation  as a part of follow up for that.   In any case, what she would like to do is to see Dr. Hillis Range when  he arrives here at the end of the summer and to discuss with him the  possibility of a pulmonary vein isolation procedure.   We will arrange that when he comes.     [name], MD, [facility]  Electronically Signed    SCK/MedQ  DD: 04/01/2008  DT: 04/01/2008  Job #: [id number]

## 2011-02-12 NOTE — Op Note (Signed)
[name], [name]             ACCOUNT NO.:  [id number]   MEDICAL RECORD NO.:  [id number]          PATIENT TYPE:  OIB   LOCATION:  2899                         FACILITY:  [facility]   PHYSICIAN:  Hillis Range, MD       DATE OF BIRTH:  04/21/1941   DATE OF PROCEDURE:  DATE OF DISCHARGE:  07/14/2008                               OPERATIVE REPORT   EP STUDY NOTE   Procedure performed by Hillis Range, MD   PREPROCEDURE DIAGNOSIS:  Persistent atrial fibrillation.   POSTPROCEDURE DIAGNOSIS:  Persistent atrial fibrillation.   PROCEDURE:  DC cardioversion.   DESCRIPTION OF THE PROCEDURE:  Informed written consent was obtained and  the patient was brought to the Short Stay area.  Adequate airway support  and IV access were assured.  The patient was then successfully sedated  with intravenous propofol by Anesthesia.  The patient was noted to be in  atrial fibrillation upon presentation.  She was successfully  cardioverted to sinus rhythm with a single synchronized 200 joules  biphasic shock with cardioversion, electrodes in the anteroposterior  thoracic configuration.  She remained in sinus rhythm thereafter.  There  were no early apparent complications.   CONCLUSIONS:  1. Persistent atrial fibrillation.  2. Successful cardioversion to sinus rhythm.      Hillis Range, MD  Electronically Signed     JA/MEDQ  D:  07/14/2008  T:  07/15/2008  Job:  [id number]   cc:   [name], MD, [facility]

## 2011-02-12 NOTE — Discharge Summary (Signed)
[name], [name]             ACCOUNT NO.:  [id number]   MEDICAL RECORD NO.:  [id number]          PATIENT TYPE:  OIB   LOCATION:  2040                         FACILITY:  [facility]   PHYSICIAN:  [name], MD, FACCDATE OF BIRTH:  Sep 09, 1941   DATE OF ADMISSION:  03/13/2009  DATE OF DISCHARGE:  03/14/2009                               DISCHARGE SUMMARY   This patient has no known drug allergies.   TIME FOR THIS DICTATION EXAMINATION AND EXPLANATION OF POSTOPERATIVE  INCISION CARE AND FOLLOWUP:  Greater than 45 minutes.   FINAL DIAGNOSES:  1. The patient has a Kappa dual-chamber pacemaker, which is at      elective replacement indicator.  2. Discharging postprocedure day #1 status post generator change with      implant of a Medtronic ADAPTA L dual-chamber pacemaker.      a.     Insulation break in the right ventricular lead, new right       ventricular lead implanted, Dr. Sherryl Manges.  3. Diabetes mellitus with elevated serum blood glucose at the      termination of this admission.      a.     During the day on March 13, 2009, serum blood glucose was       394, 320, 304, 252, and in the morning of March 14, 2009, it was       223.  The patient has metformin 250 mg nightly.  The patient had a       diabetes management consult this admission, which recommended       starting insulin, especially Lantus.   SECONDARY DIAGNOSES:  1. History of atrial fibrillation.      a.     In the past, she has failed antiarrhythmics:  Marland KitchenMarland KitchenRythmol,       Tikosyn, amiodarone, flecainide.      b.     Pulmonary vein isolation procedure on May 12, 2008, with       amiodarone coverage, Dr. Sampson Goon.      c.     Recurrent atrial fibrillation with direct current       cardioversion on June 10, 2008.      d.     Recurrent atrial fibrillation.  Amiodarone dosage increased       and repeat direct current cardioversion on July 14 2008.      e.     The patient has recurred with atrial  fibrillation, in the       future possible arterioventricular junction ablation.  2. Nonischemic cardiomyopathy, but the patient has returned to      ejection fraction of 50-55% on echocardiogram on July 17, 2008.  3. Morbid obesity.  4. Dyslipidemia.  5. Hypertension.  6. 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  7. Chronic obstructive pulmonary disease.  8. Osteoporosis.  9. Depression.  10.Obstructive sleep apnea/continuous positive airway pressure.  11.Gastroesophageal reflux disease.  12.Crohn disease.  13.Treated hypothyroidism.   PROCEDURES:  On March 13, 2009, explant of existing pacemaker which is at  elective replacement indicator with implant of the dual-chamber  Medtronic Adapta L dual-chamber pacemaker with a new right ventricular  lead implanted.  The chest x-ray shows that the leads were in  appropriate position.  The interrogation of the device shows that all  values are within normal limits.  The incision has no hematoma.   BRIEF HISTORY:  [name] is a 70 year old female.  She has been  followed by Dr. Graciela Husbands for atrial fibrillation, which has been difficult  to ablate and difficult to treat with antiarrhythmics.  She also has a  permanent pacemaker implanted for tachybrady syndrome and this turns out  to be at elective replacement indicator.  The patient will be admitted  to first available opportunity for generator change.   HOSPITAL COURSE:  The patient presents on March 13, 2009.  She underwent  generator change out without complication.  The same day, she also  required a new right ventricular lead for insulation fracture.  The  patient was kept overnight.  She had elevated serum glucose during the  stay of this admission.  This was hardly ameliorated by metformin 250 mg  daily.  She was seen by diabetes management who recommended starting  insulin; however, we were loath to do this ourselves here at the  hospital and strongly  recommend that the patient could follow up with  her primary caregiver and possibly an endocrine specialist.  The patient  discharges on her following medications with the admonition to keep her  incision dry for the next 7 days and to sponge bathe until Monday March 20, 2009.   MEDICATIONS:  1. Advair 500/50 one puff twice daily.  2. Furosemide 40 mg 1 tablet in the morning, 2 tablets in the evening.  3. Lipitor 40 mg daily, it works best overnight.  4. Coumadin 5 mg tablets 1-1/2 tablets on Monday, Wednesday, Friday, 1      tablet all other days.  5. Synthroid 50 mcg daily.  6. Coreg 12.5 mg tablets 1/2 tablet twice daily.  7. Metoprolol tartrate 50 mg tablets 1/2 tablet twice daily.  8. Omeprazole 20 mg daily.  9. Spiriva 18 mcg per inhalation 1 puff daily.  10.Fosamax 70 mg weekly.  11.Potassium chloride 20 mEq once daily.  12.Digoxin 0.25 mg daily.  13.Allopurinol 100 mg daily.  14.Sulfasalazine 500 mg 2 tablets twice daily.  15.Cozaar 25 mg daily in the evening.  16.Metformin 500 mg tablets 1/2 tablet at supper.  17.Vitamin D 1000 units daily.  18.Nasonex 1 puff each nostril daily.  19.[employer] Allergy Relief (loratadine) 10 mg daily.   Follows up at [facility], [address], [place];  at the [facility] on Wednesday, March 29, 2009, at 11 o'clock.  She is  to call [phone] for any problems.  Incision care and mobility of the  left arm have been discussed with the patient.  She is recommended to  follow closely with [facility] [place].  Dr. Graciela Husbands suggested early  followup.   LABORATORY STUDIES:  On the day of discharge, protime is 34.4, INR is  3.1.  Laboratory studies for this admission were drawn on March 09, 2009.  Basic metabolic panel, sodium 138, potassium is 4, chloride 102,  carbonate 29, glucose 357, BUN is 15, creatinine 1.2.  CBC on March 09, 2009, white cells 7.4, hemoglobin 15.4, hematocrit 45.2, platelets of  235.  Protime on March 06, 2009,  is 24.7, INR 2.4.  While speaking of  labs, we recommend strongly hemoglobin A1c to be determined by primary  caregiver at office followup.      Maple Mirza, [place]      [name], MD, [facility]  Electronically Signed    GM/MEDQ  D:  03/14/2009  T:  03/15/2009  Job:  [id number]

## 2011-02-12 NOTE — Assessment & Plan Note (Signed)
[facility]                         ELECTROPHYSIOLOGY OFFICE NOTE   NAME:[name], [name] LEWEY                    MRN:          [id number]  DATE:02/09/2007                            DOB:          1940/12/12    [name] comes in.  She is status post pacemaker implantation, has  paroxysmal atrial fibrillation.  She has been having recurrent  palpitations.  Most notably at night are these bothersome.   Her medications are notable for the Tikosyn that she takes at 250 mcg.   On examination, her blood pressure is 102/72, her pulse is 78.  Lungs  were clear and heart sounds were irregular.   Interrogation of her pacemaker demonstrated she has been in atrial  fibrillation about 25% of the time and notably her heart rate excursion  is not too bad, although there is about 20% of the beats that are faster  than 100.   There is also notably atrial undersensing which has resulted in some  under-estimation of her atrial fibrillation, and also what is  interesting is that on the Kappa 400 the atrial heart rate histogram  does not pick up atrial for refractory events, so the degree of atrial  fibrillation cannot be deduced with certainty in this situation.   We will plan to see her again in about 3 to 4 weeks' time to assess how  much atrial fibrillation she is having.  In the event that she is having  paroxysms of atrial fibrillation I think we should stop the Tikosyn.  In  the event that she is having persistent atrial fibrillation, we need to  undertake cardioversion.     [name], MD, [facility]  Electronically Signed    SCK/MedQ  DD: 02/09/2007  DT: 02/10/2007  Job #: [id number]

## 2011-02-12 NOTE — Discharge Summary (Signed)
[name], [name]             ACCOUNT NO.:  [id number]   MEDICAL RECORD NO.:  [id number]          PATIENT TYPE:  INP   LOCATION:  3733                         FACILITY:  [facility]   PHYSICIAN:  Madolyn Frieze. Jens Som, MD, FACCDATE OF BIRTH:  Apr 05, 1941   DATE OF ADMISSION:  05/25/2007  DATE OF DISCHARGE:  06/06/2007                               DISCHARGE SUMMARY   PRIMARY CARDIOLOGIST:  Dr. Sherryl Manges.   PRIMARY PULMONOLOGIST:  Dr. Marchelle Gearing.   PRIMARY CARE Demetrius Barrell:  Dr. Lajean Manes.   DISCHARGE DIAGNOSIS:  Acute systolic congestive heart failure.   SECONDARY DIAGNOSES:  1. Nonischemic/dilated cardiomyopathy.  2. Nonobstructive coronary artery disease.  3. Chronic obstructive pulmonary disease.  4. Hypertension.  5. Hyperlipidemia.  6. Chronic atrial fibrillation on amiodarone and Coumadin.  7. Sick sinus syndrome status post Medtronic DDD permanent pacemaker.  8. Crohn's disease.  9. Osteoporosis.  10.Pulmonary hypertension.  11.Depression.  12.Hypothyroidism.  13.Pre-renal azotemia status post diuresis.   ALLERGIES:  No known drug allergies.   PROCEDURE:  Left heart cardiac catheterization, 2D echocardiogram, MUGA  scan.   HISTORY OF PRESENT ILLNESS:  A 70 year old female with history of  chronic atrial fibrillation refractory to sotalol, flecainide and  Tikosyn therapy, status post DC cardioversion approximately 2 weeks  prior to admission, placed on amiodarone therapy with unfortunate  reversion back to atrial fibrillation.  For 2 weeks prior to admission,  she noted an increase in lower-extremity edema as well as dyspnea on  exertion and slight nonproductive cough.  Because of progression of  symptoms, she presented to [facility] and was admitted by  internal medicine on August 25.   HOSPITAL COURSE:  Patient ruled out for MI and was noted in the  emergency room to be in acute heart failure with a chest x-ray showing  pulmonary venous hypertension,  mild edema and small effusions.  She was  placed on IV diuretics, and cardiology was consulted.  A 2D  echocardiogram was performed and showed an EF of 30 to 40% with moderate  diffuse LV hypokinesis.  After being seen by Dr. Juanda Chance, it was  recommended that she have continued diuresis as well as a MUGA scan to  further evaluate her LV function.  MUGA scan was performed August 27 and  confirmed an EF of 35%.  As depressed LV function was the new diagnosis  for [name], the decision was made to pursue right and left heart  cardiac catheterization, which took place on August 29th and revealed a  long 70 to 80% stenosis in the proximal LAD, which was not felt to be  obstructive in nature.  Otherwise, her coronary tree revealed  nonobstructive disease.  EF was 20 to 25% with global hypokinesis.  Right heart catheterization revealed an RA of 25, an RV of 62/17 with a  wedge of 35 and a cardiac index of 1.24.  With her elevated right heart  pressures and wedge, the decision was made to continue aggressive IV  diuresis.  Pulmonology was consulted given her history of cough and  chronic obstructive pulmonary disease, and they recommended continuation  of inhaler therapy and outpatient followup with Dr. Colletta Maryland.  [name]  Verne [name] was placed on beta blocker and ACE inhibitor therapy and on  September 3, she bumped her creatinine to 1.51 with a BUN of 28.  At  that time, her Lasix was converted to orally.  Creatinine had improved  September 4 to 1.18 but has since slowly bumped up to 1.73 with a BUN of  41.  She will be discharged home today with plans to hold her Lasix and  potassium.  We will continue her ACE inhibitor.  We will arrange for  followup BMET on Monday September 8 (in 2 days) to re-evaluate her BUN  and creatinine.  If BUN and creatinine continue to rise despite holding  of Lasix, would then plan to discontinue ACE inhibitor therapy at that  time.  Will also arrange for followup with  Dr. Graciela Husbands early next week.   With regard to the patient's atrial fibrillation, it is unclear to what  extent her cardiomyopathy may be tachycardia mediated.  She remains on  amiodarone therapy and is scheduled for atrial fibrillation ablation at  [facility] in [place].   DISCHARGE LABS:  Hemoglobin 17.3, hematocrit 51.3, WBC 10.5, platelets  367, PT 28.7, INR 2.6, sodium 135, potassium 4.6, chloride 96, BUN 41,  creatinine 1.73, CO2 29, glucose 146, calcium 11.1, total bilirubin 0.7,  alkaline phosphatase 69, AST 17, ALT 18, total protein 5.9, albumin 3.1,  magnesium 1.7, CK 67, MB 1.6, troponin I 0.02, BNP 412.0 on admission,  free T4 1.15, free T3 2.1.   DISPOSITION:  Patient is being discharged home today in good condition.   FOLLOWUP PLANS AND APPOINTMENTS:  We will arrange for followup with Dr.  Graciela Husbands early next week.  She will have followup BMET on September 8 as  well as followup with Dr. Colletta Maryland in approximately 2 weeks in  pulmonology.  As noted above, she will have __________  at [facility] in the near future.   DISCHARGE MEDICATIONS:  1. Advair 250/50 one puff twice a day.  2. Spiriva 18 micrograms inhale daily.  3. Lasix 20 milligrams daily.  Currently on hold until directed      otherwise.  4. Lipitor 20 milligrams daily.  5. Lexapro 20 milligrams daily.  6. Coumadin as previously prescribed.  7. Sulfasalazine 1000 milligrams twice a day.  8. Amiodarone 200 milligrams daily.  9. Aciphex 20 milligrams daily.  10.Synthroid 25 micrograms daily.  11.Coreg 25 mg twice a day.   LAB STUDIES:  BMET is pending on September 8.   Duration discharge encounter 60 minutes including physician time.      [name], [name]      Madolyn Frieze. Jens Som, MD, [facility]  Electronically Signed    CB/MEDQ  D:  06/06/2007  T:  06/06/2007  Job:  [id number]   cc:   Kalman Shan, MD  Oley Balm. Georgina Pillion, M.D.  [name]

## 2011-02-12 NOTE — Assessment & Plan Note (Signed)
[facility]                         ELECTROPHYSIOLOGY OFFICE NOTE   NAME:[name], [name]                    MRN:          [id number]  DATE:07/13/2007                            DOB:          [birth year]    [name] was seen two days ago.  She has atrial fibrillation in the  setting of a cardiomyopathy.  She was anticoagulated since hospital, and  she is to undergo cardioversion.  She is significantly diuresed but has  retained some fluid again.   Her medications include Advair, Lasix, Lipitor, Lexapro, Coumadin,  amiodarone 200, Aciphex, Synthroid 25, Coreg 25 b.i.d., Prempro, and  Fosamax.   PHYSICAL EXAMINATION:  Her blood pressure is 117/82.  Her pulse is 76  and irregular.  LUNGS:  Clear.  CARDIAC:  Heart sounds were irregular.   Interrogation of her Medtronic Kappa 401 pulse generator demonstrated  normal pacemaker function.  Mode switch rate was reprogrammed.   She is to undergo cardioversion on the 16th.     [name], MD, [facility]  Electronically Signed    SCK/MedQ  DD: 07/15/2007  DT: 07/15/2007  Job #: [id number]

## 2011-02-12 NOTE — H&P (Signed)
[name], [name]             ACCOUNT NO.:  [id number]   MEDICAL RECORD NO.:  [id number]          PATIENT TYPE:  INP   LOCATION:  1831                         FACILITY:  [facility]   PHYSICIAN:  [name], M.D.DATE OF BIRTH:  18-May-1941   DATE OF ADMISSION:  05/25/2007  DATE OF DISCHARGE:                              HISTORY & PHYSICAL   PRIMARY CARE PHYSICIAN:  [name], M.D.   CHIEF COMPLAINT:  Shortness of breath and swelling.   HISTORY OF PRESENT ILLNESS:  The patient is a 70 year old white female  with past medical history of hypertension, obesity, atrial fibrillation  on chronic anticoagulation, and a recent diagnosis of COPD, who for the  past several months, has been having progressively worsening shortness  of breath with dyspnea on exertion. She has also noted some increased  swelling of her lower extremities. She got to the point today where she  felt so winded that she came to the emergency room. She has had no  episodes of chest pain. She also noted a mild cough but nothing severe  and non-productive. When she came into the emergency room, she was noted  to have by chest x-ray, signs consistent with congestive heart failure  with bilateral pleural effusions. The patient has had no previous  history of congestive heart failure. In the meantime, her other labs of  note, she was found to be in chronic atrial fibrillation with BNP of  1200, white count of 10 with an 84% shift and normal cardiac markers.  The patient was given 40 mg of IV Lasix plus topical nitroglycerin, as  well as an albuterol inhaler. These all improved her breathing.  Currently, she says that her berthing is easier. She denies any chest  pain. No headaches, vision changes, dysphagia. No palpitations and no  wheezing. She has mild cough, non-productive. No abdominal pain. No  hematuria, dysuria, constipation, diarrhea, focal changes, numbness,  weakness, or pain although she notes some lower  extremity swelling.  Although since she has been off her feet, she actually says her swelling  is a little bit better than earlier.   REVIEW OF SYSTEMS:  Otherwise negative.   PAST MEDICAL HISTORY:  Includes recently diagnosed COPD, atrial  fibrillation, chronic GERD, Crohn's disease, hyperlipidemia, depression,  and osteoporosis.   MEDICATIONS:  She is Aciphex 40, Advair inhaler, Amiodarone 400 p.o.  b.i.d., Boniva, Coumadin 5 p.o. q.h.s., Cardizem 240 p.o. daily, Flonase  nasal spray, Fosamax 70 p.o. each week, Lexapro 20 p.o. daily, Lipitor  20 p.o. daily, Metoprolol 50 p.o. b.i.d., Prempro and Sulfasalazine 1  gram p.o. b.i.d.   ALLERGIES:  NO KNOWN DRUG ALLERGIES.   SOCIAL HISTORY:  Denies any tobacco, alcohol, or drug use. She used to  smoke but quit 20 years ago.   FAMILY HISTORY:  Noncontributory.   PHYSICAL EXAMINATION:  VITAL SIGNS:  On admission, temperature 98.9,  heart rate 76, blood pressure 160/88. Respiratory rate 22. O2 sat 98% on  2 liters. Since then, her blood pressure has come down to 140/86.  GENERAL:  Alert and oriented times three. No apparent distress.  HEENT:  Normocephalic and atraumatic. Moist mucous membranes. She has no  carotid bruits.  CARDIOVASCULAR:  Irregular rhythm, rate controlled.  LUNGS:  She has bilateral crackles at the bases, left greater than  right.  ABDOMEN:  Soft, obese, nontender. Positive bowel sounds.  EXTREMITIES:  Show 2+ pitting edema from the mid calf down, including  pedal.   LABORATORY DATA:  BNP 1211. White count 10 with 4% shift. Hemoglobin and  hematocrit 14.5 and 43. MCV is 94, platelet count 321,000. CPK 12.9. MB  less than 1. Troponin 0.05. Sodium 136, potassium 3.2, chloride 102,  bicarb 29, BUN 6, creatinine 0.8, glucose 134. I have ordered a TSH and  PT, which are pending.   Chest x-ray is as per HPI.   EKG shows atrial fibrillation with occasional PVC's and non-specific  intraventricular block.    ASSESSMENT/PLAN:  1. New onset congestive heart failure. The patient actually tells me      that her hypertension is well controlled. I have ordered a TSH but      I suspect that the main cause of her congestive heart failure is      likely long standing chronic obstructive pulmonary disease, which      may have been from previous heavy smoking or second hand smoke.      Will plan to go with congestive heart failure education. I have      started Lasix, strict I's and O's and daily weights, plus will      check a 2-D echocardiogram with [facility] to read. In      addition, will hold her Metoprolol and put her on Coreg, depending      on her results of the echocardiogram, may need to start an ace      inhibitor as well.  2. History of atrial fibrillation.  Will check PT and INR. Continue      Coumadin, Amiodarone, and Cardizem.  3. Crohn's disease, stable. Continue Sulfasalazine.  4. Hypokalemia. Will go ahead and replace.  5. Chronic obstructive pulmonary disease, stable. Continue Advair.      [name], M.D.  Electronically Signed     SKK/MEDQ  D:  05/25/2007  T:  05/25/2007  Job:  [id number]   cc:   [name]. Georgina Pillion, M.D.  [name] Salvia, MD, [facility]

## 2011-02-12 NOTE — Discharge Summary (Signed)
NAMEABBYGALE, LAPID             ACCOUNT NO.:  [id number]   MEDICAL RECORD NO.:  [id number]          PATIENT TYPE:  INP   LOCATION:  3733                         FACILITY:  [facility]   PHYSICIAN:  Madolyn Frieze. Jens Som, MD, FACCDATE OF BIRTH:  [birth year]   DATE OF ADMISSION:  05/25/2007  DATE OF DISCHARGE:  06/06/2007                               DISCHARGE SUMMARY   ADDENDUM:   MEDICATIONS:  Lisinopril 10 mg daily.      Nicolasa Ducking, ANP      Madolyn Frieze. Jens Som, MD, [facility]  Electronically Signed    CB/MEDQ  D:  06/06/2007  T:  06/07/2007  Job:  [id number]

## 2011-02-12 NOTE — Letter (Signed)
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  She has been in atrial fibrillation and she  failed despite loaded with amiodarone to maintain sinus rhythm.  I saw  her last in November as you know and discussed the options of:  1. Trying Tikosyn.  2. Considering repeat pulmonary vein isolation.  3. Augmented rate control.  4. Atrioventricular junction ablation.  At that time, about 10% her      heartbeats were faster than 100 beats per minute.  Since that time,      she has had increasing symptoms, but without significant fluid      overload.   MEDICATIONS:  1. Lasix 40 b.i.d.  2. Lipitor 40.  3. Metoprolol succinate 25.  4. Carvedilol 6.25 daily (unusual dosing).  5. Coumadin.  6. Levothyroxine 25.  7. Omeprazole.  8. Sulfasalazine.   PHYSICAL EXAMINATION:  VITAL SIGNS:  Today her blood pressure was  100/76, her weight was 286, which is stable.  LUNGS:  Clear.  NECK:  Veins were flat.  HEART:  Sounds were irregular.  EXTREMITIES:  Just trace edema.   Interrogation of Medtronic Kappa pulse generator demonstrated  fibrillation wave of 0.7.  The impedance of 453.  The R-wave was 8 with  impedance of 749.  The threshold was 1 volt at 0.4.  Battery voltage was  2.73.  She is 27% ventricular lead paced and she has greater than 40% of  her heartbeats are faster now than 100 beats per minute.   IMPRESSION:  1. Atrial fibrillation - persistent with poorly controlled ventricular      response.  2. Recently normal left ventricular function.  3. Status post failed pulmonary vein isolation procedure.  4. Bradycardia, status post pacemaker implantation approaching the      elective replacement indicator.  5. Unusual beta-blocker dosing.   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  She is  not inclined at this point to undertake another pulmonary vein isolation  procedure.  The options that we discussed would be first pushing her  rate control and to that end I have increased her metoprolol from 25-50  mg a day.  We will see her again in 3-4 weeks and then further up  titrated if her symptoms allow and her heart rate dictates.  In the  event that this is not successful, then either AV junction ablation or  Tikosyn would be my next thought even though I am not all that  optimistic about Tikosyn.  I suspect she will come to AV junction  ablation.   As she approaches the ERI, the thought would be to consider a left  ventricular lead placement at that time.   We will plan to see her again in a month to see how we are doing with  the up titration of her medications.    Sincerely,      [name] Salvia, MD, [name]  Electronically Signed    SCK/MedQ  DD: 11/25/2008  DT: 11/26/2008  Job #: [id number]

## 2011-02-12 NOTE — Op Note (Signed)
[name], [name]             ACCOUNT NO.:  [id number]   MEDICAL RECORD NO.:  [id number]          PATIENT TYPE:  OIB   LOCATION:  2899                         FACILITY:  [facility]   PHYSICIAN:  [name], MD, FACCDATE OF BIRTH:  Aug 02, 1941   DATE OF PROCEDURE:  06/10/2008  DATE OF DISCHARGE:                               OPERATIVE REPORT   PREOPERATIVE DIAGNOSIS:  Atrial fibrillation.   POSTOPERATIVE DIAGNOSIS:  Sinus rhythm.   PROCEDURE:  Direct current cardioversion and interrogation __________ of  her pacemaker.   PROCEDURE IN DETAIL:  On obtaining informed consent, the patient was  submitted to Anesthesia by the care of Dr. Krista Blue receiving 200 mg  Sodium Pentothal.  Her Medtronic pacemaker was interrogated.  A 120-  joule shock was delivered synchronously and the atrial fibrillation  failing to restore sinus rhythm.   The patient was then shocked again to 200 joules synchronously and  atrial fibrillation restoring sinus rhythm and by pacemaker  interrogation demonstrating an atrial paced rhythm with intrinsic  conduction.   The patient tolerated the procedure without apparent complications.      [name], MD, [facility]  Electronically Signed     SCK/MEDQ  D:  06/10/2008  T:  06/11/2008  Job:  [id number]

## 2011-02-12 NOTE — Assessment & Plan Note (Signed)
[facility]                         ELECTROPHYSIOLOGY OFFICE NOTE   NAME:[name], [name] MELNIK                    MRN:          [id number]  DATE:06/08/2007                            DOB:          Nov 25, 1940    [name] was seen.  She was discharged from the hospital on  Saturday.  She has congestive heart failure that is systolic and  diastolic in the setting of pulmonary hypertension, and she got somewhat  over-zealously diuresed, that is, we could not slow down the diuresis  after she lost about 30 pounds.  Her breathing is somewhat better.   We will plan to check her labs today.   She is scheduled to see Dr. Clydie [name] at [facility] later this week  or next week regarding defibrillation ablation.   PHYSICAL EXAMINATION:  VITAL SIGNS:  On examination today, her blood  pressure was 100/74, pulse 73.  LUNGS:  Clear.  HEART:  Heart sounds were regular.   Unfortunately, her weight was not recorded today.   We will plan to see her as previously scheduled.     [name], MD, [facility]  Electronically Signed    SCK/MedQ  DD: 06/08/2007  DT: 06/09/2007  Job #: [id number]

## 2011-02-12 NOTE — Op Note (Signed)
NAMEPETER, [name]             ACCOUNT NO.:  [id number]   MEDICAL RECORD NO.:  [id number]          PATIENT TYPE:  OIB   LOCATION:  2040                         FACILITY:  [facility]   PHYSICIAN:  [name], MD, FACCDATE OF BIRTH:  Sep 17, 1941   DATE OF PROCEDURE:  03/13/2009  DATE OF DISCHARGE:                               OPERATIVE REPORT   PREOPERATIVE DIAGNOSES:  Previously implanted pacemaker with  bradycardia, permanent atrial fibrillation.   POSTOPERATIVE DIAGNOSES:  Previously implanted pacemaker with  bradycardia, permanent atrial fibrillation; insulation leak on both  leads.   PROCEDURE:  Explantation of a previously implanted device, insertion of  a new dual-chamber device, comprising the insertion of a new ventricular  lead, pirating of the previously implanted leads, and new generator  implantation.   Following obtaining informed consent, the patient was brought to the  electrophysiology laboratory and placed on the fluoroscopic table in  supine position.  After routine prep and drape, lidocaine was  infiltrated along the line of previous incision and carried down to the  layer of the device pocket using sharp dissection and electrocautery.  It was quite deep.   The pocket was opened and upon inspection, it was appreciated that there  was heme discoloration of both the ventricular and the atrial leads.  As  I dissected out the ventricular lead, the heme discoloration became  increasingly prominent as I moved away from the device itself,  suggesting a leak somewhere that we did not have access to sealing.  At  this point, we elected to put a new lead.  Cannulation was accomplished  without difficulty of the left subclavian vein.  A micropuncture system  being used and then we up sized to a 7-French sheath, and then passed a  Medtronic 5076, 58-cm active fixation lead, serial #[id number] to the  mid right ventricular septum.  In this location, the bipolar R-wave  was  20 with a pace impedance of 924, threshold of 0.9 volts at 0.4  milliseconds.  Current threshold is 1.3 mA.  There was no diaphragmatic  pacing at 10 volts.  The current of injury was brisk.  This lead was  secured to the prepectoral fascia.   At this point, given the patient's history of permanent atrial  fibrillation, having failed pulmonary vein isolation.  We capped the  right atrial lead and WE PUT THE RIGHT VENTRICULAR APICAL LEAD INTO THE  RIGHT ATRIAL PORT.  The device is programmed in the VVIR MODE, but the  lead was placed, so that we had,  A.  Ventricular backup.  B.  The potential for dual site right ventricular pacing.   The pocket was then copiously irrigated with antibiotic containing  saline solution.  The leads were attached to a Medtronic Adapta RL1  pulse generator, serial #[id number].  Ventricular pacing was identified.   Wound was then closed in three layers in the normal fashion.  The wound  was washed, dried, and a benzoin Steri-Strip dressing was applied.  Needle counts, sponge counts, and instrument counts were correct at the  end of the procedure according to staff.  The patient tolerated the  procedure without apparent complication.      [name], MD, [facility]  Electronically Signed     SCK/MEDQ  D:  03/13/2009  T:  03/14/2009  Job:  [id number]

## 2011-02-12 NOTE — Discharge Summary (Signed)
NAMEWYONIA, FONTANELLA             ACCOUNT NO.:  [id number]   MEDICAL RECORD NO.:  [id number]          PATIENT TYPE:  INP   LOCATION:  3704                         FACILITY[facility]  [facility]   PHYSICIAN:  Madolyn Frieze. Jens Som, MD, FACCDATE OF BIRTH:  01/25/1941   DATE OF ADMISSION:  04/25/2008  DATE OF DISCHARGE:  04/30/2008                               DISCHARGE SUMMARY   PRIMARY CARDIOLOGIST:  [name], MD, [facility]   PRIMARY CARE Kaedence Connelly:  Oley Balm. Georgina Pillion, MD   ALTERNATE ELECTROPHYSIOLOGIST:  Mick Sell, MD at [facility] in [place].   DISCHARGE DIAGNOSIS:  Acute on chronic systolic congestive heart  failure.   SECONDARY DIAGNOSES:  1. Chronic atrial fibrillation, on chronic Coumadin therapy,      refractory to multiple anti-arrhythmics, pending AFib ablation in      [place].  2. Hypertension.  3. Hyperlipidemia.  4. Morbid obesity.  5. Tachybrady syndrome, status post Medtronic dual-lead pacemaker in      2001.  6. History of left atrial thrombus with INR goal of 3 to 3.5.  7. Chronic obstructive pulmonary disease.  8. Osteoporosis.  9. Depression.  10.Obstructive sleep apnea, on continuous positive airway pressure.  11.Gastroesophageal reflux disease.  12.History of Crohn disease.  13.Mild prerenal azotemia, this admission.  14.Mild hyperkalemia, this admission.   ALLERGIES:  No known drug allergies.   PROCEDURES:  A 2-D echocardiogram performed on April 27, 2008, showing an  ejection fraction of 20-25% with severe diffuse left ventricular  hypokinesis, mild left ventricular hypertrophy, and moderately dilated  left atrium and right atrium.   HISTORY OF PRESENT ILLNESS:  A 70 year old female with prior history of  systolic congestive heart failure and chronic atrial fibrillation on  Coumadin therapy, who was in her usual state of health until  approximately 1 week prior to admission when she began to note  increasing dyspnea on  exertion.  Unfortunately, symptoms were persistent  and she presented to a [facility] on April 25, 2008.  EMS was contacted  and she was taken to the [facility] ED, where chest x-ray showed  probable mild CHF and she was treated with 40 mg of IV Lasix with brisk  diuresis of 2 L in the ED.  She was admitted for further evaluation and  management of acute on chronic systolic congestive heart failure.   HOSPITAL COURSE:  The patient was maintained on IV diuretics with a net  negative diuresis of approximately 10 L with reduction in weight from  131.8 kg on admission down to 128 kg on discharge.  Diuretics were  converted to p.o. on April 29, 2008, and subsequently decreased further  to Lasix 40 mg daily as a result of mild prerenal azotemia with a rise  in her BUN to 32 and her bicarbonate to 232 also.  Clinically, she is  much improved and ready for discharge today.   Secondary to some mild hypotension, we have down titrated her carvedilol  and ACE inhibitor therapy.  Her Toprol dose has remained at 100 mg  daily.  We have also added Lanoxin therapy, which  she has tolerated up  to this point.   DISCHARGE LABORATORY DATA:  Hemoglobin 14.3, hematocrit 42.0, WBC 9.1,  and platelets 264.  INR 3.2, sodium 136, potassium 5.1, chloride 94, CO2  32, BUN 31, creatinine 1.13, glucose 137, calcium 10.9, and magnesium  2.0.  CK 76, MB 1.5, and troponin I less than 0.01.  TSH 3.062.   DISPOSITION:  The patient has been discharged to home today in good  condition.   FOLLOWUP PLANS AND APPOINTMENT:  We will arrange followup with Dr. Graciela Husbands  in approximately 2-4 weeks.  She will have follow up with Dr. Sampson Goon  in [facility] for pending AFib ablation.  She was asked to follow up  with Dr. Georgina Pillion as previously scheduled.  She will follow up with  [facility] as previously scheduled.  We will obtain a BMET on  Monday, May 02, 2008, in our office.   DISCHARGE MEDICATIONS:  1. Lasix 40 mg  daily.  2. Coumadin as previously prescribed.  3. Sulfasalazine 1000 mg b.i.d.  4. Synthroid 25 mcg daily.  5. Lipitor 40 mg at bedtime.  6. Prilosec 20 mg daily.  7. Spiriva 18 mcg inhaled daily.  8. Claritin 10 mg p.r.n.  9. Coreg 12.5 mg b.i.d.  10.Digoxin 0.125 mg daily.  11.Advair 500/50 mcg 1 puff b.i.d.  12.Lisinopril 10 mg daily.  13.Toprol-XL 100 mg daily.   OUTSTANDING LABORATORY STUDY:  Followup BMET, Monday, May 02, 2008.   DURATION OF DISCHARGE/ENCOUNTER:  60 minutes including physician time.      Nicolasa Ducking, ANP      Madolyn Frieze. Jens Som, MD, [facility]  Electronically Signed    CB/MEDQ  D:  04/30/2008  T:  05/01/2008  Job:  [id number]   cc:   Mick Sell, MD  Oley Balm Georgina Pillion, M.D.

## 2011-02-12 NOTE — H&P (Signed)
NAMEVIRGA, [name]             ACCOUNT NO.:  [id number]   MEDICAL RECORD NO.:  [id number]          PATIENT TYPE:  INP   LOCATION:  3704                         FACILITY:  [facility]   PHYSICIAN:  [name] Rotunda, MD, FACCDATE OF BIRTH:  [birth year]   DATE OF ADMISSION:  04/25/2008  DATE OF DISCHARGE:                              HISTORY & PHYSICAL   PRIMARY CARE PHYSICIAN:  Dr. [name] B. [name]  Dr. Sherryl [name], Cardiologist at [facility].  Dr. Clydie [name].   CHIEF COMPLAINT:  Shortness of breath.   HISTORY OF PRESENT ILLNESS:  Ms. [name] is a 70 year old white female  with a history of atrial fibrillation and heart failure.  She has  approximately 1-week history of increased dyspnea on exertion.  Her  symptoms have progressed until she has been getting short of breath  walking room to room at her home.  She also complains of increased  fatigue.  She has not had chest pain.  Her symptoms concerned her and  she went to a [facility] where EMS was contacted and they  transported her to the emergency room.  In the ER, she has received  Lasix 40 mg IV.  She has diuresed almost 2 liters since receiving this  medication and feels much better.   Ms. [name] states that she has been compliant with sodium  restrictions.  She states that she does eat out on a regular basis.  She  states that her weight has increased about 30 pounds in the last 10  months, but she thought it was increased p.o. intake.  She has had some  lower extremity edema.  She has had dyspnea on exertion as well as  orthopnea but not really any PND, because she was placed on CPAP about a  month ago.  She has palpitations on a daily basis.  She has had less  coughing on the CPAP where she feels this because her throat is not as  dry.  She also has not had any recent wheezing.  Currently in the  emergency room, she is resting comfortably.   PAST MEDICAL HISTORY:  1. Status post cardiac  catheterization in August 2008 with LAD 70%-      80%, RCA 40%, PAS 62, wedge 35, an EF 20-25%.  2. Acute systolic congestive heart failure in August 2008 with an EF      of 30-40% by echocardiogram at that time.  3. Morbid obesity.  4. Hyperlipidemia.  5. Hypertension.  6. History of atrial fibrillation, refractory to Tikosyn, amiodarone,      flecainide, Rythmol and status post direct current cardioversion      with recurrence.  7. Tachybrady syndrome status post Medtronic Dual Lead Pacemaker in      2001.  8. COPD.  9. Osteoporosis.  10.Depression.  11.Chronic anticoagulation with Coumadin.  12.Obstructive sleep apnea on CPAP for about a month.  13.Gastroesophageal reflux disease.  14.History of Crohn disease.   SURGICAL HISTORY:  She is status post TEE, cardioversion as well as  pacemaker, colonoscopy, catheterization x2, and left fifth finger  surgery.   ALLERGIES:  No known drug allergies.   CURRENT MEDICATIONS:  1. Sulfasalazine 500 mg 2 tablets b.i.d.  2. Levothyroxine 25 mcg daily.  3. Carvedilol 25 mg b.i.d.  4. Metoprolol ER 100 mg daily.  5. Claritin 10 mg p.r.n.  6. Prilosec OTC or Aciphex daily.  7. Lipitor 40 mg a day.  8. Spiriva daily.  9. Lasix 20 mg b.i.d.  10.Coumadin 5 mg 1-1/2 tablet daily  11.Lisinopril 20 mg a day.  12.Fosamax 70 mg a week.  13.Advair 500/50 b.i.d.   SOCIAL HISTORY:  She lives in [place] alone and is a retired  Airline pilot at [employer].  She quit tobacco in 1988 with a greater than 80 pack-  year history and denies any alcohol or drug abuse.   FAMILY HISTORY:  Her mother died at age 63 of cancer and her father died  at age 30.  He was alone in coronary thrombosis is on the death  certificate, but he was found by neighbors and no autopsy was performed.  She is an only child.   REVIEW OF SYSTEMS:  Weight change, shortness of breath, and other  respiratory symptoms are described above.  She has regular reflux  symptoms.  She feels  that she is not having problems with depression at  this time.  She has not had any melena, hematemesis, or hemoptysis.  A  full 14-point review of systems is otherwise negative.   PHYSICAL EXAMINATION:  VITAL SIGNS:  Temperature 99.0, blood pressure  143/94, pulse 90, respiratory rate 18, O2 saturation 98% on 2 liters.  GENERAL:  She is a well-developed obese white female in no acute  distress on O2.  HEENT:  Normal.  NECK:  There is no lymphadenopathy, thyromegaly, or bruit noted, but she  has approximately 6 cm of JVD.  This is difficult to assess secondary to  body habitus.  CV:  Heart is irregular in rate and rhythm with an S1and S2 and no  significant murmur, rub, or gallop is noted.  LUNGS:  She has decreased breath sounds at the bases with some rales and  a few crackles.  SKIN:  No rashes or lesions are noted.  ABDOMEN:  Soft and nontender with active bowel sounds.  EXTREMITIES:  There is no cyanosis or clubbing noted, but she has trace  edema.  MUSCULOSKELETAL:  There is no joint deformity or effusions and no spine  or CVA tenderness.  NEURO:  She is alert and oriented with cranial nerves II-XII grossly  intact.   Chest x-ray shows cardiomegaly with probable mild CHF, improved from her  chest x-ray in August 2008.   EKG, atrial fibrillation rate 99 with diffuse ST depression.   LABORATORY VALUES:  Hemoglobin 14.3, hematocrit 42, WBCs 9.1, platelets  264.  Sodium 139, potassium 3.8, chloride 104, BUN 20, creatinine 1.0,  glucose 113, D-dimer 0.26.  BNP 929, INR 3.0, point of care markers  negative x2.   IMPRESSION:  Acute on chronic systolic congestive heart failure:  Ms.  [name] was seen today by Dr. Antoine [name].  She has progressive dyspnea  with exertion and mild orthopnea.  She also has a 30-pound weight gain  over several weeks or months.  Her last EF via echocardiogram was 30%-  40%, although this was lower by cath.  The etiology is possibly atrial  fibrillation  with rapid ventricular response.  Her shortness of breath  has improved with IV Lasix.  She will be admitted and we will cycle  cardiac enzymes as  well as continuing IV Lasix.  We will get a repeat  echocardiogram.  A nutrition consult will be called for possible hidden  sodium.  Hopefully, she will be back on oral Lasix in 48 hours or so.  She will be continued on her current medical therapy for atrial  fibrillation with further treatment per Dr. Graciela Husbands.      Theodore Demark, PA-C      [name] Rotunda, MD, [facility]  Electronically Signed    RB/MEDQ  D:  04/25/2008  T:  04/26/2008  Job:  [id number]

## 2011-02-12 NOTE — Assessment & Plan Note (Signed)
[facility]                         ELECTROPHYSIOLOGY OFFICE NOTE   NAME:[name], ADALEIGH WARF                    MRN:          [id number]  DATE:06/27/2008                            DOB:          09-Nov-1940    [name] comes in today approximately 17 days following  cardioversion, feeling pretty good, having some palpitations.  Interrogation of her device demonstrates that she is in atrial  fibrillation about 90% of the time, i.e. after about a day and half.  Ventricular rates have been fast.  She is scheduled to see Dr. Clydie Braun tomorrow following her PVI procedure done at [facility] and I  will await from him final recommendations.  In the interim plan to  increase her amiodarone with hopes of trying to restore sinus rhythm in  a couple weeks' time as the atrium goes through its healing process.  This will also help to control ventricular response.  Further  augmentation of beta blockers would be the next adjunctive step.   We will plan to see her in two weeks' time to undertake cardioversion  following the up titration of amiodarone from 200 a day to 800 a day in  divided doses.     [name], MD, [facility]  Electronically Signed    SCK/MedQ  DD: 06/27/2008  DT: 06/27/2008  Job #: [id number]

## 2011-02-12 NOTE — Consult Note (Signed)
[name], [name]             ACCOUNT NO.:  [id number]   MEDICAL RECORD NO.:  [id number]          PATIENT TYPE:  INP   LOCATION:  5502                         FACILITY:  [facility]   PHYSICIAN:  Everardo Beals. Juanda Chance, MD, FACCDATE OF BIRTH:  May 13, 1941   DATE OF CONSULTATION:  05/26/2007  DATE OF DISCHARGE:                                 CONSULTATION   CONSULTING PHYSICIAN:  Corinna L. Lendell Caprice, MD, [facility]   PRIMARY CARE PHYSICIAN:  Oley Balm. Georgina Pillion, M.D.   REASON FOR CONSULTATION:  Evaluation and management of congestive heart  failure and recurrent atrial fibrillation.   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  She has had recurrent atrial  fibrillation which has been refractory to sotalol, flecainide, and  Tikosyn.  She underwent DC cardioversion a little more than 2 weeks ago  after being put on amiodarone but reverted back to atrial fibrillation  shortly after that.  She has remained on amiodarone 400 mg a day.  Over  the last 2 weeks, she has developed increasing swelling in her legs and  increasing shortness of breath.  She has had a slight cough.  These  symptoms became worse, and she was admitted today, I believe after being  seen by Dr. Georgina Pillion.  She was admitted to the [facility] service.  She has  had no chest pain.   Her evaluation in the hospital has included an echocardiogram which  showed an ejection fraction of 30-40% with moderate LVH which  represented worse LV function than her previous studies which were  normal.  The study was technically difficult.  She has a past history of  nonobstructive coronary artery disease by catheterization in 2001.   PAST MEDICAL HISTORY:  Is significant for:  1. Chronic obstructive pulmonary disease.  2. Hypertension.  3. Crohn's disease.  4. She also has a Medtronic DDD pacer for her sick sinus syndrome.  5. She also has had hyperlipidemia.  6. Osteoporosis.   MEDICATIONS:  1. Xopenex nebulizer.  2. Coumadin.  3. Metoprolol 50 mg b.i.d.  4. Advair 500/50 b.i.d.  5. Lasix 40 b.i.d.  6. Amiodarone 400 a day.  7. Diltiazem 240 daily,  8. Lexapro 20 mg daily,  9. Zocor 40 mg nightly.  10.Azulfidine 500 mg 2 b.i.d.  11.KCl 40 mEq daily.  12.Protonix 40 mg daily.   SOCIAL HISTORY:  She is divorced and has two children.  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  She smoked for 30 years.  Does not drink  alcohol.  She quit smoking 20 years ago.   FAMILY HISTORY:  Is positive for coronary disease. Her father died at 63  of an MI and sudden cardiac death.   REVIEW OF SYSTEMS:  Is positive for cough and shortness of breath.   PHYSICAL EXAMINATION:  VITAL SIGNS:  Blood pressure is 123/79 and pulse  80 and irregular.  Venous pulsation noted to be visible 3 cm above the  clavicle.  The carotid pulses were equal, and I could not hear bruits.  CHEST:  Decreased breath sounds.  There were scattered wheezes and some  rales as well.  CARDIAC:  The cardiac rhythm was irregular.  Heart sounds were  diminished.  I could hear no definite murmur.  ABDOMEN:  Soft.  There were normal bowel sounds.  I could feel no  hepatosplenomegaly.  EXTREMITIES:  Legs showed 2+ edema.  The pedal pulses were equal.  MUSCULOSKELETAL:  Showed no deformities.  SKIN:  Warm and dry.  NEUROLOGIC:  Examination showed no focal neurological signs.   A chest x-ray showed congestive heart failure.   Her BUN and creatinine were 6 and 0.8, respectively.  Potassium was 3.2.  Hemoglobin was 13.6.  TSH was slightly elevated at 6.2.  Her BNP was  1211, and her INR was 3.1.   Her electrocardiogram showed paced rhythm with some competition from  intrinsic beats.   IMPRESSION:  1. Recurrent atrial fibrillation refractory to direct current      cardioversion and multiple medications including recent amiodarone.  2. New onset congestive heart failure, probably both systolic  and      diastolic.  3. Left ventricular dysfunction with ejection fraction 30-40% by      echocardiography with left hypertrophy.  4. Nonobstructive coronary artery disease by catheterization in 2001.  5. Chronic obstructive pulmonary disease.  6. Hypertension.  7. Status post Medtronic DDD pacer.  8. Crohn's disease.   RECOMMENDATIONS:  1. I agree with IV and p.o. Lasix for treatment of her congestive      heart failure.  2. I would recommend evaluation with a rest stress Myoview scan to      rule out ischemia.  This will also give [place] another evaluation of      the left ventricular function.  3. She may also require cardiac catheterization.  4. I would recommend holding Coumadin for possible further evaluation      later with catheterization.  5. Will plan to decrease amiodarone to 200 mg a day, and we will      probably want to discontinue this since this does not appear to be      effective in maintaining sinus rhythm.  If she has significant left      ventricular dysfunction, then we will want to try and treat her      with beta blocker as much as her pulmonary disease will tolerate.      Dr.      Graciela Husbands his made referral to Dr. Sampson Goon at [facility] for      possible atrial fibrillation.  She has an appointment in the next      few weeks.   Thanks very much for the consultation.      Bruce Elvera Lennox Juanda Chance, MD, [facility]  Electronically Signed     BRB/MEDQ  D:  05/26/2007  T:  05/27/2007  Job:  [id number]   cc:   Oley Balm. Georgina Pillion, M.D.  [name], MD, [facility]

## 2011-02-12 NOTE — Discharge Summary (Signed)
NAMESIGOURNEY, PORTILLO             ACCOUNT NO.:  [id number]   MEDICAL RECORD NO.:  [id number]          PATIENT TYPE:  OIB   LOCATION:  2899                         FACILITY:  [facility]   PHYSICIAN:  Hillis Range, MD       DATE OF BIRTH:  September 10, 1941   DATE OF ADMISSION:  07/14/2008  DATE OF DISCHARGE:  07/14/2008                               DISCHARGE SUMMARY   This patient has no known drug allergies.   FINAL DIAGNOSES:  1. History of atrial fibrillation status post pulmonary vein isolation      procedure, Dr. Sampson Goon, May 12, 2008.  2. Recurrent atrial fibrillation direct current cardioversion on      June 10, 2008, restoring sinus rhythm.  3. Recurrent atrial fibrillation.      a.     At office visit on June 27, 2008, amiodarone increased       from 200 mg daily to 800 mg daily.      b.     Direct current cardioversion to sinus rhythm, July 14, 2008.      c.     Further increase of beta blocker would be adjunctive next       step if the patient fails this cardioversion.  4. Failed antiarrhythmic therapy in the past including Tikosyn,      amiodarone, flecainide, and Rythmol.   SECONDARY DIAGNOSES:  1. History of nonischemic cardiomyopathy, ejection fraction of 20-25%      at left heart catheterization in September 2008.  2. History of decompensated congestive heart failure with      hospitalizations in August 2008 and July 2009.  3. Morbid obesity.  4. Hyperlipidemia.  5. Hypertension.  6. Tachybrady syndrome status post implant of a Medtronic dual-chamber      pacemaker in 2001.  7. Chronic obstructive pulmonary disease.  8. Osteoporosis.  9. Depression.  10.Obstructive sleep apnea/continuous positive airway pressure.  11.Gastroesophageal reflux disease.  12.Crohn disease.  13.Treated hypothyroidism.   PROCEDURE ON THIS ADMISSION:  Direct current cardioversion by Dr. Hillis Range.  The patient was in atrial fibrillation on admission.  The  patient is in sinus rhythm at discharge.   Briefly, Ms. [name] is a 70 year old female.  She underwent atrial  fibrillation ablation on Thursday, May 12, 2008.  It was a 6-hour  complicated procedure.  She did have a left groin hematoma.  She also  had left arm amiodarone infiltration with phlebitis, and she had an  active urinary tract infection.  The patient was seen in decompensated  congestive heart failure in August 2009.  She also presented with atrial  fibrillation, controlled ventricular rate.  The patient was discharged  on August 21 after successful treatment of her CHF and successful  treatment of her amiodarone infiltrate.  She went home on amiodarone 1  tablet daily.  She had a followup office visit with Dr. Graciela Husbands on  June 01, 2008.  She underwent direct current cardioversion for her  recurrent atrial fibrillation on June 10, 2008.  At a subsequent  office visit on June 27, 2008,  she had recurrence of atrial  fibrillation.  At that office visit, her amiodarone was increased from 1  tablet daily to 2 tablets in the morning, 2 tablets in the evening, and  then she would present for another DCCV on July 14, 2008.   HOSPITAL COURSE:  The patient presents electively on July 14, 2008.  She underwent direct current cardioversion by Dr. Hillis Range.  The  patient successfully converted to sinus rhythm, discharging in sinus  rhythm.   Her discharge medications include the following:  1. Amiodarone 2 mg tablets 2 in the morning and 2 in the evening.  2. Lasix 40 mg twice daily.  3. Coreg 6.25 mg twice daily.  4. Lipitor 40 mg at bedtime.  5. Metoprolol succinate 25 mg daily.  6. Coumadin 5 mg tablets 1-1/2 tablets on Thursday and Saturday, 1      tablet Monday, Wednesday, Friday, Sunday, and Tuesday  7. Levothyroxine 25 mcg daily.  8. Protonix 40 mg daily.  9. Omeprazole 20 mg daily.  10.Sulfasalazine 500 mg tablets, 2 in the morning and 2 in the       evening.  11.Spiriva 18 mcg per inhalation 1 inhalation daily.  12.Advair 500/50 twice daily.  13.Loratadine 10 mg as needed.  14.Vitamin D daily.  15.Potassium chloride 20 mEq in the morning, 20 mEq in the evening.   The patient had laboratory studies on July 14, 2008.  1. Her basic metabolic panel, sodium is 140, potassium 4.5, chloride      10 5, carbonate 29, glucose 170, BUN is 22, and creatinine 1.26.      Her 170 is a fasting glucose.  I must imagine too since she is      having a direct current cardioversion, the patient may have      borderline diabetes.  Pro time is 33.3, INR is 3.  Complete blood      count white cells are 7, hemoglobin 15.9, hematocrit 47.6, and      platelets are 312.   The patient may have a borderline hyperglycemic history here since her  admission glucose was 170 on July 14, 2008, and this was a fasting  study.  I think when we see her again, we will ask her to follow up with  her primary care physician for check of her serum glucose on a more  organized fashion.      Maple Mirza, [place]      Hillis Range, MD  Electronically Signed    GM/MEDQ  D:  07/14/2008  T:  07/15/2008  Job:  [id number]

## 2011-02-12 NOTE — Letter (Signed)
April 20, 2007    Dr. Clydie Braun  [facility]  [place], [place]  [address]   RE:  XAVIER, FOURNIER  MRN:  [id number]  /  DOB:  1941/06/11   Dear. David:   I hope this letter finds you well.   This letter is to introduce you to [name], a 70 year old woman  with a history of paroxysmal atrial fibrillation who has been treated  with a variety of antiarrhythmics including flecainide, Rythmol with  initiation of Tikosyn in 2004.  That did quiet well for some time, but  over the last six months, she has reverted to atrial fibrillation, and  despite cardioversions on two occasions in the last month or two has  failed to hold sinus rhythm.   Symptoms with atrial fibrillation include fatigue and exercise  intolerance, primarily.   PAST MEDICAL HISTORY:  1. Normal left ventricular function.  2. She has mild nonobstructive coronary disease at last cath in 2001      and has normal left ventricular function by Myoview scanning in      January 2008, at which time no evidence of perfusion defects was      noted either.  Her [place] score is 1 for hypertension.  3. Her other issue has been bradycardia, and she is status post      Medtronic pacemaker implantation with a capital 401.  Interrogation      today demonstrated rapid ventricular response with 35% of her beats      faster than 100 beats per minute.  4. Crohn's disease.   MEDICATION:  1. Metoprolol 50 b.i.d.  2. Diltiazem 240.  3. Coumadin.  4. Lexapro.  5. Advair.  6. Aciphex.  7. Prempro.  8. Fosamax.  9. Tikosyn which was discontinued today.  10.She also takes Sulfasalazine.   PHYSICAL EXAMINATION:  GENERAL:  She was hypertensive again today at  166/90, pulse 78.  LUNGS:  Clear.  HEART:  Sounds were irregular with early systolic murmur.  ABDOMEN:  Protuberant.  EXTREMITIES: Without peripheral edema.  I should note that her weight  was 291 pounds.   IMPRESSION:  1. Persistent atrial  fibrillation despite Tikosyn and previously      having failed flecainide and Rythmol.  2. Normal left ventricular function with a nonischemic Myoview in      2008.  3. Thromboembolic risk factors notable for hypertension.  4. Obesity.  5. Bradycardia status post Medtronic Kappa pulse generator      implantation.   [name], Mrs. [name] would like to pursue consideration of pulmonary  vein isolation for her atrial fibrillation.   I have, in the interim, begun her on amiodarone as I anticipate it may  take a couple of months, and she is really very symptomatic with her  atrial fibrillation.  The idea would be to discontinue this at your  discretion.   We will plan to load her with undertaking cardioversion in a couple of  weeks.  In addition, we will be modifying her Coumadin regimen because  of drug interaction and checking her thyroid and LFTs at baseline.   [name], thanks again for all your help.  I hope this letter finds you  well.    Sincerely,     [name], MD, [facility]  Electronically Signed   SCK/MedQ  DD: 04/20/2007  DT: 04/21/2007  Job #: [id number]

## 2011-02-12 NOTE — Procedures (Signed)
[name], [name]             ACCOUNT NO.:  [id number]   MEDICAL RECORD NO.:  [id number]          PATIENT TYPE:  OUT   LOCATION:  SLEEP CENTER                 FACILITY:  [facility]   PHYSICIAN:  Barbaraann Share, MD,FCCPDATE OF BIRTH:  [birth year]39   DATE OF STUDY:  06/22/2007                            NOCTURNAL POLYSOMNOGRAM   REFERRING PHYSICIAN:   REFERRING PHYSICIAN:  [name], MD, [facility].   INDICATION FOR STUDY:  Hypersomnia with sleep apnea.   EPWORTH SLEEPINESS SCORE:  8   MEDICATIONS:   SLEEP ARCHITECTURE:  The patient had total sleep time of only 146  minutes with very little slow wave sleep or REM.  She was extremely  restless because of the wires.  Sleep onset latency was prolonged at 155  minutes and REM onset was prolonged as well as 114 minutes.  Sleep  efficiency was very poor at 38%.   RESPIRATORY DATA:  The patient underwent CPAP titration protocol where  she was placed on a medium Resnet Quattro full face mask which seemed to  fit quite well.  The pressure was initially started at 5 cm of water and  gradually increased for breakthrough snoring.  It should be noted that  the patient did not have any obstructive events while on CPAP, but the  pressure was ultimately increased to 9 cm in order to totally resolve  snoring.  Tolerance appeared to be excellent according to the  technician.   OXYGEN DATA:  There was O2 desaturation as low as 84% prior to optimal  CPAP pressure.   CARDIAC DATA:  The patient was noted to have a paced rhythm throughout  the study.   MOVEMENT-PARASOMNIA:  None.   IMPRESSIONS-RECOMMENDATIONS:  CPAP titration study reveals good control  of obstructive events as well as snoring with a final CPAP pressure of 9  cm of water delivered by a medium Resnet Quattro face mask.  However, it  should be noted the patient had very restless sleep because of the wires  and body position changes and therefore her degree of control on the  CPAP  pressure may be misleading because of the lack of REM and slow wave  sleep.  At this point in time, I would start her on a pressure of 9 cm, however,  if she does not respond as expected, I would do an auto-titrate study in  the comfort of her own home without the concomitant wiring.      Barbaraann Share, MD,FCCP  Diplomate, American Board of Sleep  Medicine  Electronically Signed     KMC/MEDQ  D:  07/09/2007 08:46:11  T:  07/09/2007 12:41:40  Job:  [id number]

## 2011-02-12 NOTE — Letter (Signed)
June 01, 2008    Clydie Braun, MD  [facility] [place], [place] [address]   RE:  [name], [name]  MRN:  [id number]  /  DOB:  1941-08-18   Dear [name],   [name] came back in following her fibrillation ablation that [name] did in your absence a couple of weeks ago.  As you know, she  was intercurrently hospitalized for congestive heart failure that has  somewhat improved.   She does not have any problems with postprandial or lightheadedness.  She is also having problems with chronic persistent low-grade nausea.  She does not recall this from her prior exposure to amiodarone.   She does have as you know a history of Crohn disease.  She has not seen  her GI doctor recently.   At that time, a recent hospitalization for heart failure, an echo was  undertaken, which demonstrated normalization of her previously impaired  left ventricular function with EF of 55% or so.   Her medications currently include double beta-blocker therapy for rate  control including carvedilol 12.5 b.i.d. and metoprolol succinate 25 mg.  She is also on amiodarone that I guess was restarted by you 200 mg a  day.  She is on Lasix 40 b.i.d., lisinopril 5, levothyroxine, PPI,  sulfasalazine, started her on Advair, and loratadine.   PHYSICAL EXAMINATION:  VITAL SIGNS:  Her weight was 270 pounds, which is  relatively stable for her weight.  Her blood pressure was 98/64, and the  pulse was 55 that was irregular.  NECK:  Veins were flat.  LUNGS:  Clear.  HEART:  Sounds were irregular, but rapid.  ABDOMEN:  Soft.  EXTREMITIES:  Trace edema.   Interrogation of her pacemaker today demonstrated that she is in atrial  fibrillation and that her ventricular response is running about a mean  of mid-to-high 90s, at least 30-35% of her heart beats are faster than  100 beats per minute.   IMPRESSION:  1. Atrial fibrillation  with a rapid ventricular response.  2. Congestive heart failure - mixed - stable.  3. Postprandial lightheadedness with documented hypotension this      morning.  4. Low-grade nausea, question related to amiodarone or      gastrointestinal process.  5. Treated hypothyroidism.   [name], my thought was that we would bring [name] back to the  hospital and cardiovert her to see if we can maintain sinus rhythm in  the context of her ablation on amiodarone.  This will allow [place] to  address the issue of whether the amiodarone is contributing to her  nausea or not.  In the event that she fails cardioversion.  I would  discontinue the amiodarone and we have to think about next steps which  might include dronedarone, although I do not know how to include.  I do  not know how to think about that drug in the context of diastolic heart  failure.  In addition, we will plan to discontinue her lisinopril today,  cut her Coreg in half, as well as her Lasix in half because of her  hypotension and see if this does not relieve her symptoms.  Her diet  does not sound particularly provocative for postprandial  lightheadedness.   Based on the above, therefore, we are going to:  1. Undertake cardioversion.  2. Measure her TSH.  3. Discontinue her lisinopril and decrease her Lasix and Coreg as  noted.  4. Consider gastrointestinal referral following cardioversion and disk      following cardioversion.  5. Anticipate discontinuing her amiodarone if the cardioversion is not      followed by maintenance of sinus rhythm.   Hope this letter finds you well, we will look forward talking to you.    Sincerely,     [name] Salvia, MD, [facility]  Electronically Signed   SCK/MedQ  DD: 06/01/2008  DT: 06/02/2008  Job #: [id number]

## 2011-02-12 NOTE — Procedures (Signed)
NAMEBLEN, RANSOME             ACCOUNT NO.:  [id number]   MEDICAL RECORD NO.:  [id number]          PATIENT TYPE:  OUT   LOCATION:  SLEEP CENTER                 FACILITY[facility]  [facility]   PHYSICIAN:  Barbaraann Share, MD,FCCPDATE OF BIRTH:  11-May-1941   DATE OF STUDY:  12/26/207                            NOCTURNAL POLYSOMNOGRAM   REFERRING PHYSICIAN:  Barbaraann Share, MD,FCCP   INDICATION FOR STUDY:  Hypersomnia with sleep apnea.   EPWORTH SCORE:  9   SLEEP ARCHITECTURE:  Patient had total sleep time of only 162 minutes  with very little slow-wave sleep and REM.  Sleep onset latency was  prolonged at 72 minutes and REM onset was very delayed at 221 minutes.  Sleep efficiency was very poor at 44%.   RESPIRATORY DATA:  The patient was found to have 50 obstructive  hypopneas and no apneas for an apnea-hypopnea index of 19 events per  hour.  She was also found to have 18 RERAs, giving her a respiratory  disturbance index of 25 events per hour.  The events were not  positional, and moderate snoring was noted throughout.   OXYGEN DATA:  There was O2 desaturation as low as 82% with the patient's  obstructive events.   CARDIAC DATA:  Patient was found to have various rhythm abnormalities  including atrial fibrillation with a controlled ventricular response,  pacer beats noted throughout, as well as occasional PVCs and 3 to 4 beat  runs of ventricular tachycardia.   MOVEMENT-PARASOMNIA:  No significant leg jerks or abnormal behaviors  were noted.   IMPRESSION/RECOMMENDATIONS:   1.Moderate obstructive sleep apnea/hypopnea syndrome with an AHI of 19  events/hr, and O2 desaturation as low as 82%.  Treatment for this degree  of sleep apnea can include weight loss alone if applicable, upper airway  surgery, oral appliance, and also CPAP.  Clinical correlation is  suggested.  1. Atrial fibrillation with a controlled ventricular response, as well      as numerous paced beats.  Patient was  also      noted to have occasional PVCs, and isolated 3 to 4 beat runs of      ventricular tachycardia that did not appear overly clinically      significant.      Barbaraann Share, MD,FCCP  Diplomate, American Board of Sleep  Medicine  Electronically Signed     KMC/MEDQ  D:  01/12/2008 14:46:15  T:  01/12/2008 15:14:09  Job:  [id number]

## 2011-02-12 NOTE — Assessment & Plan Note (Signed)
[facility]                         ELECTROPHYSIOLOGY OFFICE NOTE   NAME:[name]                    MRN:          [id number]  DATE:03/16/2007                            DOB:          [birth year]    [name] comes in today. She has atrial fibrillation that has  been persistent for which she takes Tikosyn.  She also has bradycardia  and is status post pacemaker implantation and has had problems with  relatively rapid ventricular rates.   Her other medications include:  1. Coumadin.  2. Diltiazem 240.  3. Lexapro 20.  4. Advair.  5. Fosamax.   PHYSICAL EXAMINATION:  VITAL SIGNS: Blood pressure today is 134/88.  Her  pulse is 81 and irregular.  LUNGS: Clear.  EXTREMITIES:  Without edema.   Interrogation of her Medtronic Kappa [id number] Pulse Generator demonstrated  that we could not discern what is going on.  I spent 25 minutes on the  phone with [employer] going through the algorithms as part of her 401  device, trying to sort out how long her current episode of atrial  fibrillation had been going on.  We should not that, in the 401,  refractory events in the atrial channel are not recorded in histograms,  and so the histogram distribution does not help [place] understand atrial  fibrillation.  Secondly, there was a discordance between the atrial high-  rate number and the mode-switch episode number and the percent of time  in mode switch which prompted concern as to whether her atrial  fibrillation episode seen a month ago was persisting on to the present.  Her ventricular heart rate histograms continue to demonstrate fast heart  rates.   IMPRESSION:  1. Atrial fibrillation having been persistent with a rapid ventricular      response.  2. History of bradycardia.  3. Tikosyn therapy for #1.   At this point, given the limitations of our information, I have elected  to recommend proceeding with DC cardioversion.  Thereafter we will  hopefully be able to, with reprogramming of the device, record her heart  rate to the point that we can understand the frequency of her atrial  fibrillation.  In the event that she has recurrent paroxysms of atrial  fibrillation, I would  recommend discontinuing the Tikosyn and at that point considering  amiodarone for the short term and pulmonary vein isolation for a longer  term solution.   She is scheduled for cardioversion in about 10 days.     [name], MD, [facility]  Electronically Signed    SCK/MedQ  DD: 03/16/2007  DT: 03/16/2007  Job #: [id number]

## 2011-02-12 NOTE — Op Note (Signed)
NAMESHENEA, GIACOBBE             ACCOUNT NO.:  [id number]   MEDICAL RECORD NO.:  [id number]          PATIENT TYPE:  OIB   LOCATION:  2859                         FACILITY:  [facility]   PHYSICIAN:  Luis Abed, MD, FACCDATE OF BIRTH:  10/10/1940   DATE OF PROCEDURE:  DATE OF DISCHARGE:  05/08/2007                               OPERATIVE REPORT   The patient has atrial fib.  She is being loaded with amiodarone and  today it is time to cardiovert her.  This was arranged through our  office.  The patient is stable.   CARDIOVERSION PROCEDURE:  The patient received 220 mg of IV Pentothal by  anesthesia.  Anterior and posterior pads were in place with biphasic  defibrillator.  The patient received 200 joules and she converted to  sinus rhythm.  The pacer is to be interrogated shortly.  P-waves were  definitely seen at this time.  She is stable.   Successful cardioversion at this point.      Luis Abed, MD, [facility]  Electronically Signed     JDK/MEDQ  D:  05/08/2007  T:  05/08/2007  Job:  [id number]   cc:   [name], MD, [facility]

## 2011-02-12 NOTE — Assessment & Plan Note (Signed)
[facility]                            CARDIOLOGY OFFICE NOTE   NAME:[name], [name]                    MRN:          [id number]  DATE:04/23/2007                            DOB:          Aug 27, 1941    [name] is seen today in followup. She is primarily a patient of  Dr.  Viviann Spare Klein's.   I do believe I saw her once in December of 2007.   She is a previous patient of Dr.  Lindell Spar.   Her biggest issue is refractory atrial fibrillation. It was difficult  for me to read through all of the latest notes and also to figure out  exactly what Dr.  Graciela Husbands had planned. I think in the future it would be  much better for the patient to followup solely with Dr.  Graciela Husbands. As far  as I can tell, she has had longstanding atrial fibrillation on  anticoagulation. She has had sick sinus syndrome with implantation of a  pacemaker, Medtronic Kappa 401.   She has failed sotalol, flecainide and other anti-arrhythmics. Dr.  Graciela Husbands recently saw her on June 16. At the time, he started her on  amiodarone 400 b.i.d. She seems to be tolerating this well without  significant nausea or sun sensitivity. Her Coumadin was decreased by Dr.  Graciela Husbands and she is to see the Coumadin Clinic today. Apparently, he has  arranged for her to see Dr.  Luan Pulling at [facility] for possible  pulmonary vein isolation.   The patient had a myriad of questions regarding this and it took me over  30 minutes to answer all of her questions. In particular, she wanted to  know the risks of the procedure, why the procedure was not done prior to  trying 3-4 anti-arrhythmics.   I tried to explain these things to her. I used a heart model and showed  her where the pulmonary veins are and talked to her about the critical  mass of atrial tissue being needed to sustain atrial fibrillation.   Apparently, she was recently cardioverted by Dr.  Graciela Husbands, but is now back  in atrial fibrillation. He also  apparently has scheduled her for an  elective cardioversion on amiodarone on August 8th. Unfortunately, it  appears that both Dr.  Graciela Husbands and I are not in town that week and she  will be cardioverted by yet another doctor.   Again, I do not think that this is ideal in regards to the patient's  care and I would greatly prefer the patient to be followed solely by Dr.  Graciela Husbands if all of these arrangements are being made by him without my  involvement particularly since she already has a referral to [facility].   REVIEW OF SYSTEMS:  Is remarkable for some chronic exertional dyspnea.  She is overweight and carries a diagnosis of COPD.   Otherwise, negative.   CURRENT MEDICATIONS:  1. Coumadin as directed.  2. Cardizem 240 a day.  3. Lexapro 20 a day.  4. Advair.  5. Fosamax.  6. Amiodarone 400 b.i.d.   PHYSICAL EXAMINATION:  Is remarkable for an overweight  middle-aged white  female in no distress. Affect is appropriate. Weight is 292. Blood  pressure 158/80, pulse 90 and irregular with escape demand pacing.  HEENT: Is normal.  Thyroid is normal without thyromegaly or lymphadenopathy. There is no  JVP elevation or carotid bruits.  LUNGS:  Are clear with good diaphragmatic motion.  There is an S1, S2 with normal heart sounds. PMI is normal.  ABDOMEN: Is protuberant. Bowel sounds were positive. No tenderness. No  AAA. No hepatosplenomegaly. No hepatojugular reflux.  Femoral's are +3 bilaterally without bruits. Distal pulses are intact  with trace edema.  NEURO: Is nonfocal. There is no muscular weakness.   IMPRESSION:  1. Refractory atrial fibrillation. Plan per Dr.  Graciela Husbands. Cardioversion      to be done August 11. I am not sure who will be doing this,      although it appears that Dr.  Ladona Ridgel is in the hospital at this      time.  2. Anticoagulation. Continue Coumadin. Dose lowered now on amiodarone      400 b.i.d. To followup in the Coumadin Clinic today. Hopefully, we      can  document therapeutic INRs for the three weeks prior to      cardioversion.  3. COPD. Continue Advair. Followup with pulmonary. I do not see recent      PFTs in the chart. These will have to be done particularly given      the fact that she has been started on amiodarone. I think that one      of the reasons to send her to [facility] to have an ablation is that      amiodarone would not be an ideal longterm drug for this patient in      regards to Coumadin sensitivity and lung disease.  4. History of anxiety and depression. Continue Lexapro 20 mg a day.  5. History of hypertension. Continue low salt diet with benefit from      weight loss. Continue Cardizem 240 a day.   The patient will followup in the hospital for her cardioversion. She  will then see Dr.  Luan Pulling at [facility] and then followup with Dr.  Graciela Husbands. Again, I am not sure that there is a reason for the patient to  see me since Dr.  Graciela Husbands is initiating and forming all of her clinical  plans.     Noralyn Pick. Eden Emms, MD, [facility]  Electronically Signed    PCN/MedQ  DD: 04/23/2007  DT: 04/23/2007  Job #: [id number]

## 2011-02-15 NOTE — Op Note (Signed)
NAMETRANIKA, SCHOLLER             ACCOUNT NO.:  [id number]   MEDICAL RECORD NO.:  [id number]          PATIENT TYPE:  AMB   LOCATION:  ENDO                         FACILITY:  [facility]   PHYSICIAN:  James L. Malon Kindle., M.D.DATE OF BIRTH:  January 04, 1941   DATE OF PROCEDURE:  05/29/2005  DATE OF DISCHARGE:                                 OPERATIVE REPORT   PROCEDURE:  Colonoscopy and biopsy.   MEDICATIONS:  Fentanyl 75 mcg, Versed 7 mg IV.   INDICATIONS FOR PROCEDURE:  A nice 70 year old woman with a previous history  of Crohn's colitis whose been on chronic Azulfidine. Her last colonoscopy  showed no active Crohn's. She has also had previous adenomatous polyps.   DESCRIPTION OF PROCEDURE:  The procedure explained to the patient and  consent obtained. With the patient in the left lateral decubitus position,  the Olympus scope was inserted and advanced. The prep was excellent. Using  slight abdominal pressure, we were able to advance easily to the cecum, the  appendiceal orifice was seen. The ileocecal valve was identified and I  entered the terminal ileum for approximately 5 cm and could not advance  further than that. This was endoscopically normal. The scope was withdrawn,  the mucosa carefully examined on withdrawal. There was no signs of active  Crohn's disease throughout the entire colon. Multiple random biopsies were  taken and placed in jar #2. Approximately 30-35 cm from the anal verge, a  1.5 cm sessile polyp was encountered and was removed with the snare and  sucked through the scope. No other polyps were seen throughout the entire  colon. There were scattered diverticula. The scope was withdrawn. The  patient tolerated the procedure well.   ASSESSMENT:  1.  Sigmoid colon polyp removed, 211.3.  2.  No evidence grossly of active Crohn's colitis or Crohn's of the terminal      ileum.   PLAN:  Will continue on the same medications specifically Azulfidine 2 g  daily. Repeat  colonoscopy in 5 years, check stool yearly and will also give  her a prescription for AcipHex due to her worsening reflux. She will come  back and see mas as needed.           ______________________________  Llana Aliment Malon Kindle., M.D.     Waldron Session  D:  05/29/2005  T:  05/29/2005  Job:  [id number]   cc:   Oley Balm. Georgina Pillion, M.D.  [address]  [place]  [place] [address]  Fax: [phone]

## 2011-02-15 NOTE — Assessment & Plan Note (Signed)
[facility]                            CARDIOLOGY OFFICE NOTE   NAME:[name], [name] REPPUCCI                    MRN:          [id number]  DATE:09/19/2006                            DOB:          05/15/1941    [name] is seen today as a new patient by me.  She has previously  been seen by Dr. Fraser Din, she follows up with Dr. Graciela Husbands for her  pacemaker, which was placed in 2002.  The patient has had PAF and is on  Tikosyn.   She has a history of COPD.  There is no history of coronary disease.  Back in 2002 she had a heart cath without critical disease.  From a  cardiac perspective she is doing well.  She has chronic exertional  dyspnea due to COPD.  She has not had any significant chest pain, PND or  orthopnea.  However, she has not had a stress test in over 5 years.  She  follows up in our Coumadin Clinic.  She is a little concerned about her  pacemaker followup.  She saw Dr. Graciela Husbands I believe in November, but would  like to know what her telephone followup will be.   REVIEW OF SYSTEMS:  Remarkable for some mild lower extremity edema,  chronic exertional dyspnea.  She does not have a cough or fever.  She  has not had any significant palpitations or chest pain.   MEDICATIONS:  1. Tikosyn 250 mcg q. 12.  2. Lopressor 50 b.i.d.  3. Sulfasalazine 1 gram b.i.d.  4. Cardizem 240 a day.  5. Coumadin as directed.  6. Prempro.  7. Lexapro 20 a day.  8. Lipitor 20 a day.  She has stopped this, and would like her lipids      and liver rechecked before she restarts it.  She is concerned about      myalgias.  9. She uses an Advair Diskus.   She was just started on Boniva.   FAMILY HISTORY:  Remarkable for coronary disease on her mother's side.   PAST SURGICAL HISTORY:  Remarkable for her pacemaker.   She denies any allergies.  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  She is divorced and is active in  her  church.   PHYSICAL EXAMINATION:  Blood pressure is 120/70, she is atrially paced  at 72 pulse.  She is overweight.  LUNGS:  Do not have any active wheezes.  Her carotids are normal.  There is no thyromegaly.  HEENT:  Normal.  The pacemaker is low, under the left clavicle and deep.  There is an S1,  S2 with distant heart sounds.  ABDOMEN:  Benign.  LOWER EXTREMITIES:  Intact pulses, no edema.   EKG shows atrial pacing.  She has significant T wave inversions across  her precordium which do not appear to be paced.   IMPRESSION:  From a cardiac perspective she is stable.  Her pacemaker  seems to be working well.  We will have the pacemaker nurses set up  followup.  She will continue  to see our Coumadin Clinic.  She appears to  be maintaining sinus rhythm with atrial pacing, and we will continue her  Tikosyn.  Her non-paced QT interval is 396.   Given her risk factors, I think it is reasonable to do a followup  Myoview on the patient.  She is on p.o. beta blockers, and I think she  can tolerate adenosine.  So long as her Myoview is nonischemic I will  see her back in 6 months.   We talked a little bit about weight loss, and she does not seem too  motivated to have an exercise program.   We will check her lipid and liver profile during her Myoview, and I will  advise her about the Lipitor at that time.     Noralyn Pick. Eden Emms, MD, [facility]  Electronically Signed    PCN/MedQ  DD: 09/19/2006  DT: 09/20/2006  Job #: [id number]

## 2011-02-15 NOTE — Op Note (Signed)
[place]  Patient:    [name], [name]                    MRN: [id number] Proc. Date: 09/16/00 Adm. Date:  [id number] Disc. Date: [id number] Attending:  Corliss Marcus CC:         Myra Rude, M.D.  Candace Cruise, M.D.   Operative Report  PROCEDURE:  Revision atrial and ventricular bipolar leads placed September 15, 2000.  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  On routine follow up this morning she was noted to have very low sensing of the R and P wave.  Pacing thresholds remained good.  However, chest x-ray and fluoroscopy revealed significant dislodgement of both atrial and ventricular leads.  She is brought to the catheterization laboratory at this time for revision and reinsertion.  PROCEDURE IN DETAIL:  The patient was brought to the cardiac catheterization laboratory in the postabsorptive state.  The left pectoral region was prepped and draped in the usual sterile fashion.  Local anesthesia was obtained with infiltration of 1% lidocaine with and without epinephrine throughout the left prepectoral region.  The wound was then opened along the previous suture line and the sutures removed.  The wound was opened to the level of the pacing generator which was removed.  It was disconnected from the leads.  The anchoring sutures which were 0 Silk and 3 each on the ventricular and atrial lead were cut and removed.  Using the same leads that were placed yesterday new manipulation wires were inserted and the ventricular lead was remanipulated into the right ventricular apex.  There excellent pacing parameters were obtained that were quite stable.  The patient was asked to deep breath and cough and there was no movement of the leads.  The leads were tested for diaphragmatic pacing at 10 volts and none was found.  The ventricular lead was then sutured into place  again using 3 separate 0 Silk ligatures.  The atrial lead in a similar fashion was manipulated into the right atrial appendage but inadequate pacing thresholds were obtained.  It was therefore turned laterally and manipulated against the lateral wall of the right atrium.  There excellent pacing parameters were obtained. The screw was deployed and the lead was shown to be well anchored by manipulation of the exterior portion of the lead.  It again, was tested for diaphragmatic pacing and none was found at 10 volts.  The patient had a cough on deep breathing without any movement at the leads.  The lead was then sutured into place again using three separate 0 silk ligatures.  The pacing pocket was then copiously irrigated using 1% kanamycin solution. The leads were then reconnected to the pacing generator and each lead was carefully tightened into place using the set screws.  The leads were aligned beneath the pacemaker and the pacemaker was placed in the pocket.  The pocket was inspected for bleeding and none was found.  The pocket was then closed using 2-0 Dexon in 2 layers for the subcutaneous portion in a running fashion. Then 5-0 Dexon was used to approximate the skin in a running subcuticular fashion.  Steri-Strips and a sterile dressing were applied.  The patient was transported to the recovery room in stable condition, in an A sense, V paced mode.  EQUIPMENT DATA:  Please see the report dated September 15, 2000.  There has  been no changes.  PACING DATA:  The ventricular lead detected a 16.2 mV R wave.  The impedance was 409 ohms.  The pacing threshold was 0.6 volts at 0.5 Msec giving a current of 1.1 mA.  The atrial lead detected a 2.2 mV P wave.  The impedance was 547 ohms.  The threshold was 0.5 volts with 0.5 Msec.  The pacing threshold was 1.0 mA. DD:  09/16/00 TD:  09/17/00 Job: [id number]

## 2011-02-15 NOTE — Discharge Summary (Signed)
   [name], [name]                       ACCOUNT NO.:  [id number]   MEDICAL RECORD NO.:  [id number]                   PATIENT TYPE:  INP   LOCATION:  3712                                 FACILITY:  [facility]   PHYSICIAN:  [name], M.D.               DATE OF BIRTH:  [birth year]   DATE OF ADMISSION:  08/07/2003  DATE OF DISCHARGE:  08/10/2003                                 DISCHARGE SUMMARY   PRIMARY DIAGNOSIS:  Atrial fibrillation.   HISTORY OF PRESENT ILLNESS:  This is a 70 year old female with a past  medical history of PAF with rapid ventricular response who failed Rythmol,  flecainide, and DC cardioversion.  She was evaluated by Dr. Graciela Husbands for  exertional dyspnea, diaphoresis and was admitted for Tikosyn loading.  The  patient feels better since off Tambocor, but still with dyspnea with hills  and steps.  The patient was admitted for initiation of Tikosyn.  The patient  was started on Tikosyn.  She had prolonged QTC on 500 dose.  Her dose was  decreased to 250 mcg b.i.d.  Hydrochlorothiazide dose was then discontinued.  The patient tolerated the dosage of 250 mg well and had no immediate  postoperative complications; and was discharged to home in stable condition  on the evening of November 10.   DISCHARGE MEDICATIONS:  She was discharged on her previous medications  1. Magnesium oxide 400 b.i.d.  2. Lexapro 20 daily.  3. Metoprolol 50 b.i.d.  4. Fosamax weekly.  5. Advair b.i.d.  6. Lipitor 10 nightly.  7. Sulfasalazine 1000 mg b.i.d.  8. Zyrtec 100 b.i.d.  9. Prempro 0.625 daily.  10.      Cardizem CD 240 daily.  11.      Coumadin 7.5 nightly.    DISCHARGE INSTRUCTIONS:  She was to have a low fat, low cholesterol, and low  salt diet.  A PT/INR to be done on Monday.  She was to have blood work at  [facility] office on November 19 for a BMET as well as magnesium and follow  up with Dr. Graciela Husbands on December 8 at 12 noon.      Chinita Pester, C.R.N.P. [facility]                  [name], M.D.    DS/MEDQ  D:  08/10/2003  T:  08/11/2003  Job:  [id number]   cc:   [name], M.D.   Oley Balm Georgina Pillion, M.D.  [address]  [place]  [place] [address]  Fax: [phone]   Candace Cruise, M.D.

## 2011-02-15 NOTE — Procedures (Signed)
[place]. [facility]  Patient:    [name], [name]                    MRN: [id number] Adm. Date:  [id number] Attending:  Corliss Marcus CC:         Meade Maw, M.D.  Oley Balm Georgina Pillion, M.D.   Procedure Report  PROCEDURES PERFORMED: 1. Insertion of dual-chamber permanent transvenous pacemaker. 2. Left subclavian venogram.  INDICATIONS:  Ms. [name] is a 70 year old woman who has recently been found to have atrial fibrillation.  She had recently been treated with Tambocor, diltiazem, and metoprolol for rate control.  An episode of recurrent atrial arrhythmia resulted in prolonged pause after return to sinus rhythm. Therefore, tachybrady syndrome is diagnosed, and the patient is brought for permanent transvenous pacemaker.  DESCRIPTION OF PROCEDURE:  The patient was brought to the electrophysiology laboratory in the postabsorptive state.  The left prepectoral region was prepped and draped in the usual sterile fashion.  Local anesthesia was obtained with infiltration of 1% lidocaine with epinephrine throughout the left prepectoral region.  A left subclavian venogram was performed following the percutaneous IV injection of 20 cc of Omnipaque.  A digital cineangiogram "road map" was obtained for latter guidance of the venous stick.  The vein was confirmed to be widely patent and coursing in a normal fashion over the anterior surface of the first rib and the middle third of the clavicle.  It joined the left innominate vein to form the right superior vena cava.  There was no persistence of the left superior vena cava.  A 7-8 cm incision was made two fingerbreadths below the clavicle.  This was carried down by sharp dissection and electrocautery to the prepectoral fascia. There a plane was lifted and a pocket formed inferiorly using blunt dissection.  The pocket was then packed with antibiotic solution-soaked gauze. Two separate left subclavian  punctures were then made using an 18-gauge thin-walled needle, through which was passed an 0.038 inch tight J guidewire. Over the initial guidewire, a 7 French tear-away sheath and dilator were advanced.  The dilator and wire were removed.  The ventricular lead was advanced to the level of the right atrium, and the sheath was torn away. Using standard technique and fluoroscopic landmarks, the lead was manipulated into place in the right ventricular apex.  There, excellent pacing parameters were obtained, which will be noted below.  The lead was then tested for diaphragmatic pacing at 10 volts, and none was found.  The lead was then sutured into place using three separate 0 silk ligatures.  Over the remaining guidewire, a 9 French tear-away sheath and dilator were advanced.  The dilator was removed.  The guidewire was allowed to remain in place.  The atrial lead was advanced to the level of the right atrium, and the sheath was torn away. Again using standard technique and fluoroscopic landmarks, the lead was manipulated into place in the right atrial appendage.  There, adequate pacing parameters were obtained, as will be noted below.  The lead was then sutured into place using three separate 0 silk ligatures.  It was also tested for diaphragmatic pacing at 10 volts, and none was found.  The antibiotic-soaked gauze was then removed from the pocket, and the pocket was copiously irrigated using antibiotic solution.  The leads were then connected to the pacing generator, identifying the ventricular lead by its serial number.  The leads were tightened in place carefully at all four  screw set points.  The leads were then wound beneath the pacemaker and the pacemaker was placed in the pocket.  The pocket was inspected for bleeding, and none was found.  The pocket was then closed using 2-0 Dexon and/or 2-0 Vicryl in a running fashion through the subcutaneous layers.  The skin was approximated using  5-0 Dexon in a running subcuticular fashion.  Steri-Strips and a sterile dressing were applied.  The patient was transported to the recovery area in stable condition in an A-sense, V-pace mode.  EQUIPMENT DATA:  The pacing generator is a Medtronic Kappa, model I3378731, serial number [id number].  The atrial lead is a Medtronic model number [id number], serial number [id number].  The ventricular lead is a Medtronic model number [id number], serial number [id number].  PACING DATA:  The ventricular lead detected an 8.5 millivolt R-wave.  The pacing threshold was 0.4 volts at 0.5 msec., resulting in a current of 0.7 MA. The resistance was 770 Ohms.  The atrial lead detected a 2.3 millivolt P-wave. The pacing threshold was 1.1 volts at 0.5 msec., resulting in a current of 2.3 MA.  The impedance was 602 Ohms.  The atrial lead was an active-fixation, screw-in model.  The ventricular lead was a passive tined lead. DD:  09/15/00 TD:  09/16/00 Job: [id number]

## 2011-02-15 NOTE — H&P (Signed)
[place]. [facility]  Patient:    [name], [name]                    MRN: [id number] Adm. Date:  [id number] Attending:  Meade Maw A                         History and Physical  PROCEDURE:  Attempted electrical cardioversion.  CARDIOLOGIST:  Meade Maw, M.D.  DESCRIPTION OF PROCEDURE:  After obtaining written informed consent, anesthesia was consulted and provided anesthesia with sodium pentothal.  The patient was appropriately sedated.  Attempts at synchronized cardioversion with 360 joules resulted in a short run of sinus rhythm.  The patient then converted back to atrial fibrillation.  The patient was again cardioverted with another 360 joules.  She initially had a sinus pause of 6 seconds followed by junctional escape, then a short run of sinus rhythm, and then back to atrial fibrillation.  IMPRESSION:  Successful cardioversion but with resumption of atrial fibrillation.  PLAN:  Discharge the patient home off of propafenone.  She will be brought in at a later at date.  Attempts at cardioverting with ibutilide will be performed.  She will continue with Toprol for rate control.  Her Coumadin dose will be adjusted for her INR.  INR goal is 2 to 3.DD:  07/17/00 TD:  07/17/00 Job: [id number]

## 2011-02-15 NOTE — H&P (Signed)
[place]. [facility]  Patient:    [name], [name]                    MRN: [id number] Adm. Date:  [id number] Attending:  Meade Maw A Dictator:   Anselm Lis, N.P. CC:         Oley Balm. Georgina Pillion, M.D.   History and Physical  DATE OF BIRTH: [birth year]  CHIEF COMPLAINT: [name] is a 70 year old obese female who was diagnosed with atrial fibrillation (exact duration unknown but discovered in July 2001 while in [place]), status post unsuccessful attempt at direct current cardioversion in the presence of Rythmol therapy during her [facility] admission of October 15 through July 17, 2000.  HISTORY OF PRESENT ILLNESS: With present illness discontinued and rate controlled she was on Cardizem and Toprol.  The patient now returns for initiation of Sotalol therapy for (hopefully) chemical cardioversion and, if failing that, subsequent direct current cardioversion, tentatively scheduled for Wednesday, August 06, 2000, at 2 p.m. (case [id number]).  The patient has been on systemic anticoagulation greater than three months (initiated April 01, 2000).  PAST MEDICAL HISTORY:  1. Crohns disease, in remission.  2. Dyslipidemia, on Lipitor.  3. Atrial fibrillation, first noted July 2001.  4. History of hypertension, good control on Cardizem and Toprol.  5. Obesity.  6. COPD, Advair.  7. Seasonal allergies.  ALLERGIES: No known drug allergies.  MEDICATIONS:  1. Prempro 0.625 mg p.o. q.d.  2. Sulfasalazine 500 mg p.o. b.i.d.  3. Toprol XL 50 mg p.o. q.d.  4. Lipitor 10 mg p.o. q.d.  5. Claritin 10 mg p.o. q.d.  6. Coumadin 7.5 mg p.o. q.h.s.  7. Ranitidine 150 mg p.o. q.d.  8. Cardizem CD 240 mg p.o. q.d.  9. Advair diskus q.d.  SOCIAL HISTORY: The patient quit smoking tobacco in 1988, with prior 32 year history of smoking three packs of cigarettes per day.  Negative ETOH. Caffeine use not excessive.  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  She has two children (one daughter and one son).  She is divorced.  FAMILY HISTORY: Mother deceased at the age of 69 of breast cancer.  Father deceased at the age of 51 with CAD.  No siblings.  REVIEW OF SYSTEMS: She does wear trifocals.  Good dentition.  Negative hearing difficulties.  Denies syncope, light headedness, dizziness or near fainting spells.  Negative dysphagia for food or fluids.  Negative for constipation, diarrhea, melena, or bright red blood per rectum.  Denies dysuria or hematuria.  No arthritic type complaints.  Does have episodic dependent-type pedal edema.  Denies orthopnea and PND.  Positive DOE.  PHYSICAL EXAMINATION:  VITAL SIGNS: Blood pressure 146/92, heart rate 90 and regular, respiratory rate 18, temperature 98.1 degrees.  Room air oxygen saturation 96%.  GENERAL: She is a well-nourished 70 year old female, in no apparent distress.  HEENT: Bilateral carotid upstrokes without bruit.  NECK: Without JVD.  CHEST: Lung sounds clear except for some scant bibasilar crackles.  Negative CVA tenderness.  CARDIAC: Irregularly irregular rhythm and rate without murmurs, rubs, or gallops.  Normal S1 and S2.  ABDOMEN: Soft, obese, nondistended.  Negative abdominal aortic and renal bruits.  Abdomen nontender to applied pressure.  Negative organomegaly.  No masses appreciated.  Difficult examination secondary to obesity.  EXTREMITIES: There are +2/4 bilateral radial and dorsalis pedis pulses. Slight pedal swelling.  NEUROLOGIC: Cranial nerves 2-12 grossly intact.  Patient alert  and oriented x 3.  GU/RECTAL: Examinations deferred.  LABORATORY DATA: QTC by EKG reveals atrial fibrillation with controlled ventricular response; QTC 470-486 msec by my calculations).  PT 24.7, INR 3.2, PTT 45.  Sodium 140, potassium 4.6, chloride 106, CO2 29, BUN 16, creatinine 0.9, glucose 117.  Magnesium 1.8.  IMPRESSION:  1. Atrial fibrillation  after direct current cardioversion in the presence of     Rythmol therapy last month.  Currently her QTC is too prolonged for     Tikosyn therapy.  2. History of Crohns disease, currently quiescent on Azulfidine.  3. Systemic anticoagulation with INR within good range at 3.2 today.  4. History of chronic obstructive pulmonary disease.  5. Obesity.  PLAN:  1. Initiation of Sotalol therapy.  Will DC Toprol and decrease dose of     Cardizem to 100 mg p.o. q.d. toward avoiding excess bradycardia.  Will     dose with p.r.n. IV Cardizem 5 mg q.6h should heart rate rebound greater     than 100.  2. Daily pro time/INR.  3. Anticipate direct current cardioversion at 2 p.m. Wednesday if the patient     remains in atrial fibrillation. DD:  08/04/00 TD:  08/05/00 Job: [id number]

## 2011-02-15 NOTE — Discharge Summary (Signed)
[place]. [facility]  Patient:    [name], [name]                    MRN: [id number] Adm. Date:  [id number] Disc. Date: [id number] Attending:  Corliss Marcus Dictator:   Anselm Lis, N.P. CC:         Oley Balm. Georgina Pillion, M.D.   Discharge Summary  PROCEDURES: 1. September 15, 2000, by Francisca December, M.D., insertion of a dual chamber    Medtronic pacemaker (serial number [id number]).  Ventricular lead    Medtronic [id number]; serial number [id number].  Ventricular lead: [id number].    Serial number [id number]. 2. September 16, 2000, by Meade Maw, M.D., coronary angiography revealing    apical hypokinesis, ejection fraction 60%.  Normal coronary arteries    without significant stenosis.  Apical hypokinesis. 3. September 16, 2000, atrial and ventricular pacemaker lead revision    secondary to dislodgment by Dr. Amil Amen.  DISCHARGE DIAGNOSES: 1. Tachy-brady syndrome; maintaining normal sinus rhythm on Tambocor.    Insertion of a permanent transvenous dual chamber pacemaker on    September 15, 2000, with revision of dislodged atrial and ventricular    leads on September 16, 2000.  Device functioning well at time of discharge. 2. Abnormal Cardiolite done in screen setting of atrial fibrillation;    cardiac catheterization revealing normal coronary arteries and preserved    left ventricular function; ejection fraction 60%. 3. Obesity. 4. Borderline hypertension; reinitiated Cardizem at 180 mg p.o. q.d. 5. History of Crohns disease; quiescent on Asacol.  PLAN: 1. The patient discharged to home in stable condition. 2. Discharge medications:    a. Prempro 0.625/2.5 mg p.o. q.d.    b. Asacol 500 mg two tablets p.o. b.i.d.    c. Lipitor 10 mg p.o. q.d.    d. Claritin 10 mg p.o. q.d.    e. Ranitidine 150 mg p.o. b.i.d.    f. Tambocor 100 mg p.o. b.i.d.    g. (New) Tylenol No. 3 one to two tablets q.4h. p.r.n. pain.    h. (New) Cardizem CD 180 mg p.o. q.d. 3.  Activity:  Progressive raising of left arm so that she has full    range of motion in two days. 4. Diet:  As before. 5. Wound care:  Keep wound site dry for two days then may bathe.  Do    not remove Steri-Strips. 6. Special instructions:  The patient to call the clinic if she develops    a large amount of swelling or bruising or signs of infection at that wound    site.  She may put some vitamin E ointment or cream on her wound site. 7. Follow-up:  Thursday, October 03, 1999, at 11:30 with Dr. Corliss Marcus for    pacemaker wound check; Monday, October 07, 1999, at 10:45 with Dr. Meade Maw.  Pacemaker nurse, Mindy, will call or mail the patient a list    of follow-up pacemaker appointments, first visit anticipated in    approximately three months.  She will bring her pacemaker box with her    to that visit.  BRIEF HOSPITAL COURSE:  Ms. [name] is a pleasant 70 year old female with history of hypertension who was initially seen in our clinic for evaluation of irregular heart rate.  She received anticoagulation therapy and addition of propafenone July 25, 2000, but unable to effect cardioversion on propafenone and started on Tambocor with successful cardioversion to sinus rhythm but problems  with acceleration of heart rhythm from 120 to 140.  Her Cardizem was increased and metoprolol was added to regimen but the patient noted with episodes of bradycardia.  She had a screening adenosine Cardiolite revealing anteroapical defect from the mid base approaching the apex without significant redistribution.  EF preserved at 56%.  Apical hypokinesis.  She was admitted electively for pacemaker placement which was performed September 15, 2000.  Follow-up heart catheterization secondary to abnormal Cardiolite the next morning did reveal normal coronary arteries; however, right atrial and ventricular pacemaker leads were noted to be dislodged. The patient underwent revision of pacemaker leads  that afternoon and post procedure chest x-ray and pacemaker interrogation by rep on September 17, 2000, confirmed leads in appropriate position and device working well.  The patients wound site looked well. She will be sent home on Cardizem 120 p.o. q.d. (beta-blocker not added back secondary to her asthma).  She did have episodes of increased heart rate to 150 sinus tachycardia with some mild exertion just prior to discharge but had only just received her Cardizem dose.  PREVIOUS MEDICAL HISTORY: 1. Atrial fibrillation; maintaining normal sinus rhythm on Tambocor. Past    failure to maintain normal sinus rhythm on propafenone. 2. Obesity. 3. History of dyslipidemia. 4. History of Crohns disease; stable on sulfasalazine. 5. Hypertension. 6. History of chronic obstructive pulmonary disease. 7. Seasonal allergies.  LABORATORY TESTS AND DATA:  Labs prior to admission (September 12, 2000) revealed wbc of 6, hemoglobin 14.8, hematocrit 42.2, platelets 329. Coagulation studies:  Protime of 39, PT 19.6 with INR 2.1.  Sodium 140, potassium 3.8, chloride 109, CO2 25, glucose 118, BUN 12, creatinine 0.8. LFTs within normal range.  Post pacemaker insertion chest x-ray on September 17, 2000, revealed no pneumoperitoneum with pacemaker leads right atrial and right ventricular position.  Time spent discharging the patient was 40 minutes including discussion with the patient, preparing discharge papers and dictating discharge summary. DD:  09/17/00 TD:  09/18/00 Job: [id number]

## 2011-02-15 NOTE — Op Note (Signed)
[facility]  Patient:    [name], [name] Visit Number: [id number] MRN: [id number]          Service Type: Attending:  Elisha Ponder, M.D. Dictated by:   Elisha Ponder, M.D. Proc. Date: 03/27/02                             Operative Report  DATE OF BIRTH:  [birth year]  PREOPERATIVE DIAGNOSIS:  Displaced left small finger middle phalanx fracture.  POSTOPERATIVE DIAGNOSIS:  Displaced left small finger middle phalanx fracture.  PROCEDURE: 1. Closed reduction and pinning of left small finger middle phalanx fracture. 2. Stress radiography.  SURGEON:  Elisha Ponder, M.D.  ASSISTANT:  None.  COMPLICATIONS:  None.  ANESTHESIA:  Local with IV sedation (the patient was given a Marcaine and lidocaine without epinephrine block).  DRAINS:  None.  ESTIMATED BLOOD LOSS:  Minimal.  INDICATIONS FOR PROCEDURE:  The patient is a 70 year old female who presents with the above mentioned diagnosis and after counseling in regards to risks and benefits of surgery including risks of infection, bleeding, anesthesia, damage to neural structures, and failure of the surgery to accomplish its intended goals of relieving symptoms and restoring function.  With this in mind, she desires to proceed.  All questions have been encouraged and answered preoperatively.  OPERATIVE FINDINGS:  This was noted to have a displaced middle phalanx fracture.  She was taken to the operative suite and underwent successful closed manipulation and pinning.  She tolerated the procedure well without difficulty.  I did bury the pin at a 90 degree angle and this will need to be removed later.  We will hold her still for at least six weeks.  DESCRIPTION OF PROCEDURE:   The patient was seen by myself and anesthesia, taken to the operative suite, and given preoperative antibiotics in the form of Ancef.  She was laid supine, appropriately padded, and prepped and draped in the usual  sterile fashion about the left upper extremity.  Once this was accomplished, the patient underwent closed reduction under fluoroscopy of the middle phalanx fracture.  This was done utilizing the distal phalanx as a lever on.  Once it was reduced, I placed a 0.045 K-wire, distal-to-proximal, and checked radiographs.  Radiographs revealed excellent position.  I then backed the K-wire off a bit, bent it at 90 degrees, and I clipped it.  Following this, a small counter incision was made distally and the intermedullary rod was advanced, and a suture distally was placed.  Thus, the pain was buried beneath the skin for later removal.  This was buried deep against the bone, and it was at 90 degrees so that there would be no proximal migration.  I checked the center x-ray that was placed with the x-rays and positioned.  At this point in time, I then stress tested her under fluoroscopy, and was very pleased with the reduction, and stability.  The PIP joint moved nicely without problems.  The wound was then dressed with Xeroform followed by gauze. The tourniquet was not inflated for the case.  Following this, a finger splint was placed, and the patient was then awoken, and transferred to the recovery room in stable condition.  She will return to the office in seven days for therapy and 14 days for MD visit, and will continue her clindamycin she had previously been on.  She was given Ancef before and after the operation  in the hospital.  Appropriate pain medicine was given and she will notify us of any problems.  It has been a pleasure to participate in her care.  All questions have been encouraged and answered. Dictated by:   Elisha Ponder, M.D. Attending:  Elisha Ponder, M.D. DD:  03/27/02 TD:  03/29/02 Job: [id number]

## 2011-02-15 NOTE — Procedures (Signed)
[place]. [facility]  Patient:    [name], [name]                    MRN: [id number] Proc. Date: 08/06/00 Adm. Date:  [id number] Disc. Date: [id number] Attending:  Meade Maw A                           Procedure Report  PROCEDURE PERFORMED:  Electrical cardioversion.  INDICATIONS FOR PROCEDURE:  Persistent atrial fibrillation.  [name] is a 70 year old female with a past medical history of atrial fibrillation.  She had been previously admitted for chemical cardioversion.  This was unsuccessful.  The patient was subsequently discontinued from propafenone, discharged to home and readmitted for loading sotalol.  Following 72 hours of sotalol the patient remains in atrial fibrillation.  Risks and benefits of these options were discussed.  Anesthesia was consulted. The patient was synchronized, cardioverted with 360 joules to sinus rhythm. She will continue with Coumadin for an additional three weeks and sotalol 120 mg p.o. b.i.d. DD:  08/06/00 TD:  08/07/00 Job: [id number]

## 2011-02-15 NOTE — Procedures (Signed)
[place]. [facility]  Patient:    [name], [name] Visit Number: [id number] MRN: [id number]          Service Type: END Location: ENDO Attending Physician:  Orland Mustard Dictated by:   Llana Aliment. Randa Evens, M.D. Proc. Date: 04/12/02 Admit Date:  04/12/2002   CC:         Oley Balm. Georgina Pillion, M.D.   Procedure Report  DATE OF BIRTH:  30-May-1941  PROCEDURE:  Colonoscopy and biopsy with coagulation of polyps.  MEDICATIONS:  Fentanyl 70 mcg, Versed 7 mg IV.  INDICATIONS:  Patient with previous history of polyps and Crohns colitis. This is done to evaluate for further polyps or active colitis.  DESCRIPTION OF PROCEDURE:  The procedure had been explained to the patient and consent obtained.  With the patient in the left lateral decubitus position, the Olympus pediatric scope was inserted and advanced under direct visualization.  The prep was quite good, and we were able to reach the cecum. The ileocecal valve and appendiceal orifice were seen.  The scope was withdrawn in the cecum, and transverse colon looked endoscopically normal. Biopsies were obtained and placed in jar #1.  The descending colon looked normal, and all biopsies were taken randomly and placed in jar #2.  There was a 3 mm sessile polyp seen at approximately 60-70 cm, and this was cauterized with hot biopsy forceps and placed in jar #3.  There was moderate diverticular disease in the sigmoid colon.  The sigmoid and rectum appeared endoscopically normal.  Biopsies were taken and placed in jar #4.  The scope was withdrawn. The patient tolerated the procedure well.  ASSESSMENT: 1. Descending colon polyp cauterized. 2. No gross evidence of Crohns disease. 3. Diverticulosis.  PLAN: 1. Routine postpolypectomy instructions. 2. Three-year repeat. 3. Check bowel for active Crohns. Dictated by:   Llana Aliment. Randa Evens, M.D. Attending Physician:  Orland Mustard DD:  04/12/02 TD:   04/13/02 Job: [id number]

## 2011-02-15 NOTE — Assessment & Plan Note (Signed)
[facility]                           ELECTROPHYSIOLOGY OFFICE NOTE   NAME:[name], [name]                    MRN:          [id number]  DATE:08/06/2006                            DOB:          11-22-1940    Ms. [name] comes in for followup of her atrial fibrillation. It is  recurrent. She has problems with rapid ventricular response. She also has a  history of hypertension. She is currently managed with Tikosyn and rate  control agents.   When I asked her if she is doing alright she says yes, but on further  questioning she feels like she is not quite doing as well as she would like.   MEDICATIONS:  1. Tikosyn 250 mcg.  2. Metoprolol 50 b.i.d.  3. Sulfasalazine 1000 twice a day.  4. Diltiazem 240.  5. Coumadin.  6. Prempro.  7. Lexapro.  8. Lipitor.  9. Advair.  10.AcipHex.   PHYSICAL EXAMINATION:  VITAL SIGNS:  Her weight is up to 309 pounds which is  10 pounds over a year and a half ago and 45 pounds over 3 years ago. Her  blood pressure is elevated at 144/94, her pulse is 76.  LUNGS:  Clear.  HEART:  Sounds were regular.  EXTREMITIES:  1-2+ edema.   Electrocardiogram demonstrated A pacing with intrinsic induction followed by  a PVC and then V pacing. Further evaluation of this demonstrated that she  was having PVCs retrograde conduction falling in the __________ and then AV  pacing with atrial failure to capture and then ventricular pacing. She  otherwise has no intrinsic atrial rhythm. Her pacing impedance was 580, her  threshold was 1 volt at 0.4, the R wave was 8 with impedance of 776 with a  threshold of 0.5 at 0.4.  Battery voltage was 2.77. The device was  reprogrammed to accommodate __________ and during this I actually induced  atrial fibrillation.   IMPRESSION:  1. Paroxysmal atrial fibrillation currently on Tikosyn with histograms      demonstrated atrial fibrillation about 10% of the time as well as      having a  bimolar distribution noted previously which is likely the      aforementioned retrograde conduction phenomenon.  2. Bradycardia status post pacer as noted above.  3. Hypertension.  4. Progressive weight gain.  5. Fatigue.   [name] is doing okay but not great. Her device is reprogrammed and  hopefully that will ameliorate some of her symptoms.    ______________________________  [name], MD, [facility]    SCK/MedQ  DD: 08/07/2006  DT: 08/08/2006  Job #: [id number]   cc:   Oley Balm. Georgina Pillion, M.D.

## 2011-02-15 NOTE — Cardiovascular Report (Signed)
[place]  Patient:    [name], [name]                    MRN: [id number] Proc. Date: 09/16/00 Adm. Date:  [id number] Attending:  Corliss Marcus CC:         Oley Balm. Georgina Pillion, M.D.                        Cardiac Catheterization  PROCEDURE PERFORMED:  Left heart catheterization, coronary angiography, single plane ventriculogram.  INDICATIONS FOR PROCEDURE:  Ongoing dyspnea with reversible ischemia with fixed defect in anterior interior apical region.  DESCRIPTION OF PROCEDURE:  After obtaining written informed consent, the patient was brought to the cardiac catheterization lab in the postabsorptive state.  Preoperative sedation was achieved using IV Versed, IV fentanyl.  The right groin was prepped and draped in the usual sterile fashion.  Local anesthesia was achieved using 1% Xylocaine.  The 6 French hemostasis sheath was placed into the right femoral artery using a modified Seldinger technique. Selective coronary angiography was performed using a JL4, JR4 Judkins catheter.  Single plane ventriculogram was performed in the RAO position using a 6 French pigtail curved catheter.  Multiple views were obtained.  FINDINGS:  The aorta pressure was 167/90, LV pressure was 164/18.  Single plane ventriculogram revealed apical hypokinesis, ejection fraction of approximately 60%.  CORONARY ANGIOGRAPHY:  Left main coronary artery:  The left main coronary artery bifurcated into the left anterior descending and circumflex vessel. It gave rise to a small diagonal #1, large diagonal #2, small diagonal #3, ended as an apical recurrent and there was no significant disease in the LAD or its branches.  Circumflex:  The circumflex vessel gave rise to a large bifurcating OM-1, bifurcating OM-2, and had no significant disease.  Right coronary artery:  The right coronary artery gave rise to a small RV marginal #1 and 2, a large PDA and large PL branch with a  20-30% mid vessel stenosis noted in the right coronary artery.  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  Preserved systolic function.  RECOMMENDATIONS:  After appropriate lead placement, the patient will be started on Versed for better rate control, should she develop tachyarrhythmia and further antihypertensive control. DD:  09/16/00 TD:  09/17/00 Job: [id number]

## 2011-02-15 NOTE — Discharge Summary (Signed)
[place]  Patient:    [name], [name]                    MRN: [id number] Adm. Date:  [id number] Disc. Date: [id number] Attending:  Meade Maw A Dictator:   Anselm Lis, N.P. CC:         Oley Balm. Georgina Pillion, M.D.                           Discharge Summary  DATE OF BIRTH:  [birth year].  PROCEDURES:  (July 17, 2000) Unsuccessful attempt at direct current cardioversion, presence of Rythmol therapy; remained in atrial fibrillation.  DISCHARGE DIAGNOSES: 1. Atrial fibrillation: [name] is a 70 year old female with a history of    hypertension who was admitted for elective chemical cardioversion with    propafenone. During course of admission she remained in atrial fibrillation    with QTC at 172 msec on October 16 and QTC at 529 msec by ET precalculation    July 17, 2000. She remained on Cardizem 240 mg p.o. q.d. with initiation    of Rythmol 150 mg p.o. q.8h. during the course of admission. Her Toprol 50    mg, which she was on prior to admission, was placed on hold. On July 17, 2000 she underwent unsuccessful attempt at direct current cardioversion;    she did have 6 sec pause post shock. She had intermittent sinus beats, but    no sustained sinus rhythm. Post cardioversion she received her Cardizem    240, as well as reinitiation of her Atenolol. Her heart rate would increase    with activity, but settle down with rest. It is expected she will achieve,    again, control of ventricular response once the Cardizem and Toprol kick    in. 2. Systemic anticoagulations secondary to atrial fibrillation; pro time    on admission was 26.3 with INR of 3.8 on Coumadin 7.5 mg a day. She was    decreased to to 5 mg with subsequent pro time of 30.5 and INR of 4.6 on the    17th. That evening she was decreased to 2.5, and the next day (October 18)    her pro time was 30.2, INR 4.6. Her Coumadin will be held tonight. She will    resume 5  mg p.o. q.d. with followup pro time/INR check in our clinic in    five days with the pharmacist. 3. Obesity. 4. History of dyslipidemia:  Lipitor 10 mg per day. 5. History of Crohns disease:  Stable on sulfasalazine.  PLAN: 1. The patient is discharged home in stable condition. 2. Discharge medications:    A. Cardizem 240 mg p.o. q.d.    B. Coumadin 5 mg to have none tonight and then resume one tablet q.h.s.    C. Hydrocortisone cream applied to paddle burn up to t.i.d. p.r.n.    D. Toprol XL 50 mg once daily.    E. Prempro once daily.    F. Lipitor 10 mg p.o. q.d.    G. Claritin 10 mg p.o. q.d.    H. Ranitidine 150 mg p.o. q.d.    I. Advair inhaler q.a.m. 3. Activities:  as before. 4. Diet:  low fat low cholesterol. 5. Followup:  Tuesday October 23 at 11:45 a.m. to see Dawn to adjust the    Coumadin dosage. Friday October 26 at 9 a.m. to see  Dr. Meade Maw. Will    discuss with the patient, at that time, the possibility of readmission to    the hospital with telemetry monitoring with possible ibutilide chemical    version to be attempted.  LABORATORY TESTS:  Sodium 138, potassium 4, chloride 110, CO2 26, BUN 19, creatinine 0.8, glucose 96, calcium 10.1, magnesium 1.8. Admission pro time of 26.3 with INR of 3.8, PT on the 17th of 30.5 with INR 4.6. PT on the 18th of 30.2 with INR of 4.6.  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 QTC of 472 msec. On the 18th heart rate was atrial fibrillation with controlled ventricular response of 86 beats/min with QTC of 529 msec. DD:  07/17/00 TD:  07/17/00 Job: [id number]

## 2011-03-07 ENCOUNTER — Ambulatory Visit (INDEPENDENT_AMBULATORY_CARE_PROVIDER_SITE_OTHER): Payer: Medicare Other | Admitting: *Deleted

## 2011-03-07 DIAGNOSIS — I4891 Unspecified atrial fibrillation: Secondary | ICD-10-CM

## 2011-03-07 LAB — POCT INR: INR: 2.8

## 2011-03-21 ENCOUNTER — Encounter: Payer: Medicare Other | Admitting: *Deleted

## 2011-03-25 ENCOUNTER — Ambulatory Visit (INDEPENDENT_AMBULATORY_CARE_PROVIDER_SITE_OTHER): Payer: Medicare Other | Admitting: *Deleted

## 2011-03-25 DIAGNOSIS — I4891 Unspecified atrial fibrillation: Secondary | ICD-10-CM

## 2011-03-25 LAB — POCT INR: INR: 2.4

## 2011-03-25 MED ORDER — WARFARIN SODIUM 5 MG PO TABS: ORAL_TABLET | ORAL | Status: DC

## 2011-04-08 ENCOUNTER — Encounter: Payer: Medicare Other | Admitting: *Deleted

## 2011-04-09 ENCOUNTER — Ambulatory Visit (INDEPENDENT_AMBULATORY_CARE_PROVIDER_SITE_OTHER): Payer: Medicare Other | Admitting: *Deleted

## 2011-04-09 DIAGNOSIS — I4891 Unspecified atrial fibrillation: Secondary | ICD-10-CM

## 2011-04-09 LAB — POCT INR: INR: 2.6

## 2011-04-12 ENCOUNTER — Other Ambulatory Visit: Payer: Self-pay | Admitting: Internal Medicine

## 2011-04-23 ENCOUNTER — Ambulatory Visit (INDEPENDENT_AMBULATORY_CARE_PROVIDER_SITE_OTHER): Payer: Medicare Other | Admitting: *Deleted

## 2011-04-23 DIAGNOSIS — I4891 Unspecified atrial fibrillation: Secondary | ICD-10-CM

## 2011-04-23 LAB — POCT INR: INR: 1.9

## 2011-05-07 ENCOUNTER — Ambulatory Visit (INDEPENDENT_AMBULATORY_CARE_PROVIDER_SITE_OTHER): Payer: Medicare Other | Admitting: *Deleted

## 2011-05-07 DIAGNOSIS — I4891 Unspecified atrial fibrillation: Secondary | ICD-10-CM

## 2011-05-10 ENCOUNTER — Other Ambulatory Visit: Payer: Self-pay | Admitting: Internal Medicine

## 2011-05-21 ENCOUNTER — Ambulatory Visit (INDEPENDENT_AMBULATORY_CARE_PROVIDER_SITE_OTHER): Payer: Medicare Other | Admitting: *Deleted

## 2011-05-21 DIAGNOSIS — I4891 Unspecified atrial fibrillation: Secondary | ICD-10-CM

## 2011-06-05 ENCOUNTER — Ambulatory Visit (INDEPENDENT_AMBULATORY_CARE_PROVIDER_SITE_OTHER): Payer: Medicare Other | Admitting: *Deleted

## 2011-06-05 DIAGNOSIS — I4891 Unspecified atrial fibrillation: Secondary | ICD-10-CM

## 2011-06-28 LAB — TROPONIN I: Troponin I: <0.01

## 2011-06-28 LAB — POCT I-STAT, CHEM 8
Creatinine, Ser: 1
HCT: 42
Hemoglobin: 14.3
Sodium: 139
TCO2: 29

## 2011-06-28 LAB — BASIC METABOLIC PANEL
BUN: 25 — ABNORMAL HIGH
CO2: 30
CO2: 30
CO2: 32
Calcium: 10.5
Calcium: 10.9 — ABNORMAL HIGH
Chloride: 94 — ABNORMAL LOW
Creatinine, Ser: 1.06
Creatinine, Ser: 1.13
GFR calc Af Amer: 54 — ABNORMAL LOW
GFR calc Af Amer: >60
GFR calc Af Amer: 58 — ABNORMAL LOW
GFR calc non Af Amer: 45 — ABNORMAL LOW
GFR calc non Af Amer: >60
Glucose, Bld: 110 — ABNORMAL HIGH
Glucose, Bld: 137 — ABNORMAL HIGH
Potassium: 3.7
Potassium: 4.2
Potassium: 4.7
Sodium: 134 — ABNORMAL LOW
Sodium: 138
Sodium: 139
Sodium: 140
Sodium: 136

## 2011-06-28 LAB — POCT CARDIAC MARKERS
CKMB, poc: <1 — ABNORMAL LOW
CKMB, poc: <1 — ABNORMAL LOW
Myoglobin, poc: 66.6

## 2011-06-28 LAB — CBC
HCT: 42.6
Hemoglobin: 14.3
MCV: 97.3
Platelets: 264
WBC: 9.1

## 2011-06-28 LAB — CK TOTAL AND CKMB (NOT AT ARMC): CK, MB: 1.8

## 2011-06-28 LAB — MAGNESIUM: Magnesium: 2

## 2011-06-28 LAB — PROTIME-INR
INR: 3 — ABNORMAL HIGH
INR: 3.1 — ABNORMAL HIGH
INR: 3.2 — ABNORMAL HIGH
Prothrombin Time: 31.2 — ABNORMAL HIGH
Prothrombin Time: 33.4 — ABNORMAL HIGH
Prothrombin Time: 35.2 — ABNORMAL HIGH

## 2011-06-28 LAB — D-DIMER, QUANTITATIVE: D-Dimer, Quant: 0.26

## 2011-06-28 LAB — B-NATRIURETIC PEPTIDE (CONVERTED LAB): Pro B Natriuretic peptide (BNP): 929 — ABNORMAL HIGH

## 2011-06-28 LAB — DIFFERENTIAL
Eosinophils Absolute: 0.1
Eosinophils Relative: 1
Lymphocytes Relative: 11 — ABNORMAL LOW
Lymphs Abs: 1
Monocytes Absolute: 0.8
Monocytes Relative: 9

## 2011-06-28 LAB — CARDIAC PANEL(CRET KIN+CKTOT+MB+TROPI)
CK, MB: 1.5
Relative Index: INVALID
Total CK: 76
Total CK: 90
Troponin I: 0.01

## 2011-07-01 LAB — BASIC METABOLIC PANEL
BUN: 22
CO2: 29
Chloride: 105
Creatinine, Ser: 1.26 — ABNORMAL HIGH
GFR calc Af Amer: 51 — ABNORMAL LOW
GFR calc non Af Amer: 42 — ABNORMAL LOW
Glucose, Bld: 170 — ABNORMAL HIGH
Sodium: 140

## 2011-07-01 LAB — CBC
Platelets: 312
RBC: 4.76
WBC: 7

## 2011-07-01 LAB — PROTIME-INR
INR: 3 — ABNORMAL HIGH
Prothrombin Time: 33.3 — ABNORMAL HIGH

## 2011-07-03 ENCOUNTER — Encounter: Payer: Medicare Other | Admitting: *Deleted

## 2011-07-03 LAB — BASIC METABOLIC PANEL
Calcium: 10
GFR calc Af Amer: >60
GFR calc non Af Amer: 54 — ABNORMAL LOW
Potassium: 3.7
Sodium: 133 — ABNORMAL LOW

## 2011-07-03 LAB — CBC
MCHC: 34
MCV: 98.3
Platelets: 305
WBC: 8.1

## 2011-07-03 LAB — PROTIME-INR: Prothrombin Time: 40.8 — ABNORMAL HIGH

## 2011-07-04 LAB — POCT CARDIAC MARKERS
CKMB, poc: <1 ng/mL — ABNORMAL LOW (ref 1.0–8.0)
Myoglobin, poc: 91.9 ng/mL (ref 12–200)
Troponin i, poc: <0.05 ng/mL (ref 0.00–0.09)

## 2011-07-04 LAB — CBC
MCHC: 33.4 g/dL (ref 30.0–36.0)
MCV: 99.3 fL (ref 78.0–100.0)
Platelets: 351 10*3/uL (ref 150–400)
RDW: 13.8 % (ref 11.5–15.5)

## 2011-07-04 LAB — BASIC METABOLIC PANEL
BUN: 17 mg/dL (ref 6–23)
CO2: 23 mEq/L (ref 19–32)
Calcium: 10 mg/dL (ref 8.4–10.5)
Chloride: 101 mEq/L (ref 96–112)
Creatinine, Ser: 1.43 mg/dL — ABNORMAL HIGH (ref 0.4–1.2)
GFR calc Af Amer: 44 mL/min — ABNORMAL LOW (ref >60)
GFR calc non Af Amer: 36 mL/min — ABNORMAL LOW (ref >60)
Glucose, Bld: 131 mg/dL — ABNORMAL HIGH (ref 70–99)
Potassium: 3.6 mEq/L (ref 3.5–5.1)
Sodium: 132 mEq/L — ABNORMAL LOW (ref 135–145)

## 2011-07-04 LAB — B-NATRIURETIC PEPTIDE (CONVERTED LAB): Pro B Natriuretic peptide (BNP): 114 pg/mL — ABNORMAL HIGH (ref 0.0–100.0)

## 2011-07-04 LAB — DIFFERENTIAL
Basophils Relative: 1 % (ref 0–1)
Eosinophils Absolute: 0 10*3/uL (ref 0.0–0.7)
Neutrophils Relative %: 77 % (ref 43–77)

## 2011-07-04 LAB — PROTIME-INR
INR: 3.1 — ABNORMAL HIGH (ref 0.00–1.49)
Prothrombin Time: 34.4 seconds — ABNORMAL HIGH (ref 11.6–15.2)

## 2011-07-09 ENCOUNTER — Ambulatory Visit (INDEPENDENT_AMBULATORY_CARE_PROVIDER_SITE_OTHER): Payer: Medicare Other | Admitting: *Deleted

## 2011-07-09 DIAGNOSIS — I4891 Unspecified atrial fibrillation: Secondary | ICD-10-CM

## 2011-07-12 LAB — CBC
HCT: 45.7
HCT: 48.1 — ABNORMAL HIGH
HCT: 49.1 — ABNORMAL HIGH
HCT: 49.8 — ABNORMAL HIGH
HCT: 51.3 — ABNORMAL HIGH
HCT: 42.3
HCT: 43.1
HCT: 44.7
Hemoglobin: 15.5 — ABNORMAL HIGH
Hemoglobin: 16 — ABNORMAL HIGH
Hemoglobin: 16.3 — ABNORMAL HIGH
Hemoglobin: 16.5 — ABNORMAL HIGH
Hemoglobin: 16.7 — ABNORMAL HIGH
Hemoglobin: 13.6
Hemoglobin: 13.7
Hemoglobin: 13.9
Hemoglobin: 14.1
Hemoglobin: 14.5
Hemoglobin: 15
MCHC: 33.4
MCHC: 33.5
MCHC: 33.8
MCHC: 33.3
MCHC: 33.4
MCHC: 33.5
MCHC: 33.5
MCHC: 33.6
MCHC: 33.8
MCV: 93.4
MCV: 94.2
MCV: 94.8
MCV: 94.3
MCV: 94.4
MCV: 94.7
MCV: 95.9
MCV: 96
Platelets: 326
Platelets: 367
Platelets: 311
Platelets: 321
RBC: 5.06
RBC: 5.08
RBC: 5.26 — ABNORMAL HIGH
RBC: 5.28 — ABNORMAL HIGH
RBC: 4.22
RBC: 4.4
RBC: 4.48
RBC: 4.57
RBC: 4.72
RDW: 14.3 — ABNORMAL HIGH
RDW: 14.5 — ABNORMAL HIGH
RDW: 14.7 — ABNORMAL HIGH
RDW: 14.1 — ABNORMAL HIGH
RDW: 14.3 — ABNORMAL HIGH
RDW: 14.5 — ABNORMAL HIGH
RDW: 14.7 — ABNORMAL HIGH
RDW: 14.8 — ABNORMAL HIGH
WBC: 10
WBC: 8.6
WBC: 10

## 2011-07-12 LAB — BASIC METABOLIC PANEL
BUN: 21
BUN: 41 — ABNORMAL HIGH
BUN: 15
BUN: 8
CO2: 26
CO2: 28
CO2: 30
CO2: 33 — ABNORMAL HIGH
CO2: 39 — ABNORMAL HIGH
Calcium: 11.1 — ABNORMAL HIGH
Calcium: 10
Calcium: 10.1
Chloride: 92 — ABNORMAL LOW
Chloride: 96
Chloride: 98
Chloride: 94 — ABNORMAL LOW
Chloride: 96
Chloride: 98
Creatinine, Ser: 1.33 — ABNORMAL HIGH
Creatinine, Ser: 0.82
GFR calc Af Amer: 42 — ABNORMAL LOW
GFR calc Af Amer: 48 — ABNORMAL LOW
GFR calc Af Amer: 52 — ABNORMAL LOW
GFR calc Af Amer: 55 — ABNORMAL LOW
GFR calc Af Amer: >60
GFR calc non Af Amer: 29 — ABNORMAL LOW
GFR calc non Af Amer: 46 — ABNORMAL LOW
GFR calc non Af Amer: 55 — ABNORMAL LOW
GFR calc non Af Amer: 56 — ABNORMAL LOW
GFR calc non Af Amer: 57 — ABNORMAL LOW
GFR calc non Af Amer: >60
Glucose, Bld: 144 — ABNORMAL HIGH
Glucose, Bld: 146 — ABNORMAL HIGH
Glucose, Bld: 146 — ABNORMAL HIGH
Glucose, Bld: 111 — ABNORMAL HIGH
Glucose, Bld: 127 — ABNORMAL HIGH
Glucose, Bld: 139 — ABNORMAL HIGH
Glucose, Bld: 176 — ABNORMAL HIGH
Potassium: 4.2
Potassium: 4.2
Potassium: 4.6
Potassium: 4.8
Potassium: 4.8
Potassium: 5.3 — ABNORMAL HIGH
Potassium: 3.7
Potassium: 4.1
Sodium: 134 — ABNORMAL LOW
Sodium: 135
Sodium: 135
Sodium: 136
Sodium: 137
Sodium: 139
Sodium: 140

## 2011-07-12 LAB — POCT I-STAT 3, ART BLOOD GAS (G3+)
Acid-Base Excess: 6 — ABNORMAL HIGH
Bicarbonate: 33.1 — ABNORMAL HIGH
O2 Saturation: 93
TCO2: 35
pCO2 arterial: 54.9 — ABNORMAL HIGH
pO2, Arterial: 68 — ABNORMAL LOW

## 2011-07-12 LAB — PROTIME-INR
INR: 1.4
INR: 2.2 — ABNORMAL HIGH
INR: 2.6 — ABNORMAL HIGH
INR: 2.6 — ABNORMAL HIGH
INR: 1.3
INR: 1.9 — ABNORMAL HIGH
INR: 2.6 — ABNORMAL HIGH
INR: 2.9 — ABNORMAL HIGH
INR: 3.1 — ABNORMAL HIGH
Prothrombin Time: 29 — ABNORMAL HIGH
Prothrombin Time: 16 — ABNORMAL HIGH
Prothrombin Time: 16.1 — ABNORMAL HIGH
Prothrombin Time: 18.7 — ABNORMAL HIGH
Prothrombin Time: 22.6 — ABNORMAL HIGH
Prothrombin Time: 29.1 — ABNORMAL HIGH
Prothrombin Time: 33.1 — ABNORMAL HIGH

## 2011-07-12 LAB — DIFFERENTIAL
Eosinophils Absolute: 0
Eosinophils Relative: 0
Lymphs Abs: 0.5 — ABNORMAL LOW
Monocytes Absolute: 0.9 — ABNORMAL HIGH
Monocytes Relative: 9

## 2011-07-12 LAB — T3, FREE: T3, Free: 2.1 — ABNORMAL LOW (ref 2.3–4.2)

## 2011-07-12 LAB — CK TOTAL AND CKMB (NOT AT ARMC)
CK, MB: 1.2
Relative Index: INVALID

## 2011-07-12 LAB — COMPREHENSIVE METABOLIC PANEL
ALT: 18
BUN: 8
Calcium: 9.8
Creatinine, Ser: 0.9
GFR calc non Af Amer: >60
Glucose, Bld: 119 — ABNORMAL HIGH
Sodium: 142
Total Protein: 5.9 — ABNORMAL LOW

## 2011-07-12 LAB — POCT I-STAT 3, VENOUS BLOOD GAS (G3P V)
Bicarbonate: 30.8 — ABNORMAL HIGH
Operator id: 250661
TCO2: 33
pH, Ven: 7.311 — ABNORMAL HIGH
pO2, Ven: 31

## 2011-07-12 LAB — T4, FREE: Free T4: 1.15

## 2011-07-12 LAB — I-STAT 8, (EC8 V) (CONVERTED LAB)
BUN: 6
Chloride: 102
Glucose, Bld: 134 — ABNORMAL HIGH
Hemoglobin: 16.3 — ABNORMAL HIGH
Potassium: 3.2 — ABNORMAL LOW
Sodium: 136
pH, Ven: 7.439 — ABNORMAL HIGH

## 2011-07-12 LAB — APTT: aPTT: 63 — ABNORMAL HIGH

## 2011-07-12 LAB — HEPARIN LEVEL (UNFRACTIONATED): Heparin Unfractionated: 0.49

## 2011-07-12 LAB — CARDIAC PANEL(CRET KIN+CKTOT+MB+TROPI)
CK, MB: 1.9
Total CK: 67
Total CK: 68

## 2011-07-12 LAB — MAGNESIUM: Magnesium: 1.4 — ABNORMAL LOW

## 2011-07-12 LAB — POCT CARDIAC MARKERS
CKMB, poc: <1 — ABNORMAL LOW
Operator id: 146091
Troponin i, poc: <0.05

## 2011-07-12 LAB — TSH: TSH: 6.226 — ABNORMAL HIGH

## 2011-07-17 LAB — PROTIME-INR: INR: 2.1 — ABNORMAL HIGH

## 2011-08-05 ENCOUNTER — Ambulatory Visit (INDEPENDENT_AMBULATORY_CARE_PROVIDER_SITE_OTHER): Payer: Medicare Other | Admitting: Internal Medicine

## 2011-08-05 ENCOUNTER — Ambulatory Visit (INDEPENDENT_AMBULATORY_CARE_PROVIDER_SITE_OTHER): Payer: Medicare Other | Admitting: *Deleted

## 2011-08-05 ENCOUNTER — Encounter: Payer: Self-pay | Admitting: Internal Medicine

## 2011-08-05 DIAGNOSIS — I4891 Unspecified atrial fibrillation: Secondary | ICD-10-CM

## 2011-08-05 DIAGNOSIS — I10 Essential (primary) hypertension: Secondary | ICD-10-CM

## 2011-08-05 DIAGNOSIS — Z950 Presence of cardiac pacemaker: Secondary | ICD-10-CM

## 2011-08-05 LAB — PACEMAKER DEVICE OBSERVATION
BMOD-0005RV: 95 {beats}/min
BRDY-0002RV: 60 {beats}/min
RV LEAD AMPLITUDE: 22.4 mv
RV LEAD IMPEDENCE PM: 734 Ohm

## 2011-08-05 LAB — POCT INR: INR: 4.3

## 2011-08-05 NOTE — Patient Instructions (Signed)
Your physician wants you to follow-up in: 6 months with Kristin/Paula for a device check & 1 year with Dr. Klein. You will receive a reminder letter in the mail two months in advance. If you don't receive a letter, please call our office to schedule the follow-up appointment.  Your physician recommends that you continue on your current medications as directed. Please refer to the Current Medication list given to you today.  

## 2011-08-05 NOTE — Assessment & Plan Note (Signed)
Encouraged to lose weight

## 2011-08-05 NOTE — Assessment & Plan Note (Signed)
The patient's device was interrogated.  The information was reviewed. No changes were made in the programming.    

## 2011-08-05 NOTE — Progress Notes (Signed)
HPI  [name] is a 70 y.o. female seen in followup for atrial fibrillation with a somewhat rapid ventricular response. she is status post Pulm Vein isolation at [facility] 2009.   We have been using beta blockers to control the rate with good success of late.She has regained the weight  But her hgbaic is 5.3 She has minimal peripheral edema   Past Medical History  Diagnosis Date  . Nonischemic cardiomyopathy     history of,  EF 20-25% at left heart cath in 06/2007; echo 2011 had normal LV function  . CHF (congestive heart failure)     history of decompensated CHF with hospitalizations in 05/2007 and 03/2008  . Morbid obesity   . Hyperlipidemia   . Tachy-brady syndrome     status post implant of a medtronic dual-chamber pacemaker in 2001.  explanted in 2010 -- medtronic ADAPTA L dual-chamber pacemaker in 2010.  Marland Kitchen Chronic obstructive pulmonary disease   . Osteoporosis   . Depression   . Obstructive sleep apnea     continuous positive airway pressure  . GERD (gastroesophageal reflux disease)   . Crohn's disease   . Hypothyroidism     treated  . Seasonal allergies     Past Surgical History  Procedure Date  . Pulmonary vein isolation 05/12/2008    RFCA atrial fibrillation  . Post ablation pseudoaneurysm     at A fib ablation  . Cardiac catheterization 08/2000    noncritical disease mid RCA, EF preserved  . Cardiac catheterization 05/29/2007    noncritical disease, EF 20-25%  . Medtronic adapta  2010    dual chamber pacemaker implanted     Current Outpatient Prescriptions  Medication Sig Dispense Refill  . allopurinol (ZYLOPRIM) 100 MG tablet Take 100 mg by mouth daily.        Marland Kitchen atorvastatin (LIPITOR) 80 MG tablet Take 80 mg by mouth at bedtime.        . B-D INS SYRINGE 0.5CC/31GX5/16 31G X 5/16" 0.5 ML MISC as directed.      Marland Kitchen BONIVA 150 MG tablet Take 1 tablet by mouth Every month.      . Fiber CAPS Take 5 capsules by mouth daily.        .  Fluticasone-Salmeterol (ADVAIR DISKUS) 500-50 MCG/DOSE AEPB Inhale 1 puff into the lungs every 12 (twelve) hours.        Marland Kitchen FREESTYLE LITE test strip as directed.      . furosemide (LASIX) 40 MG tablet TAKE 1 TABLET EVERY MORNING AND TAKE 2 TABLETS EVERY EVENING  270 tablet  1  . insulin glargine (LANTUS) 100 UNIT/ML injection Inject 30 Units into the skin every morning.       . Lancets (FREESTYLE) lancets as directed.      Marland Kitchen levothyroxine (SYNTHROID, LEVOTHROID) 25 MCG tablet Take 25 mcg by mouth daily.        Marland Kitchen loratadine (CLARITIN) 10 MG tablet Take 10 mg by mouth as needed.        Marland Kitchen losartan (COZAAR) 25 MG tablet Take 25 mg by mouth at bedtime.        . metoprolol (LOPRESSOR) 50 MG tablet Take 1 & 1/2 tablets by mouth twice daily  270 tablet  3  . omeprazole (PRILOSEC OTC) 20 MG tablet Take 20 mg by mouth daily.        . potassium & sodium phosphates (PHOS-NAK) 280-160-250 MG PACK Take 1 packet by mouth daily.        Marland Kitchen  sulfaSALAzine (AZULFIDINE) 500 MG tablet Take 1,000 mg by mouth 2 (two) times daily.       Marland Kitchen warfarin (COUMADIN) 5 MG tablet Take as directed by Anticoagulation clinic    180 tablet  1    No Known Allergies  Review of Systems negative except from HPI and PMH  Physical Exam Well developed and well nourished in no acute distress HENT normal E scleral and icterus clear Neck Supple JVP flat; carotids brisk and full Clear to ausculation Irregularly irregular Soft with active bowel sounds No clubbing cyanosis trace edema Alert and oriented, grossly normal motor and sensory function Skin Warm and Dry  ECG A. Fib at 93 Intervals /09/ 0.37 axis is 133 Assessment and  Plan

## 2011-08-05 NOTE — Assessment & Plan Note (Signed)
Atrial fibrillation is now permanent it is really well rate controlled. We'll continue warfarin

## 2011-08-05 NOTE — Assessment & Plan Note (Signed)
Stable on current medications 

## 2011-08-28 ENCOUNTER — Ambulatory Visit (INDEPENDENT_AMBULATORY_CARE_PROVIDER_SITE_OTHER): Payer: Medicare Other | Admitting: *Deleted

## 2011-08-28 DIAGNOSIS — I4891 Unspecified atrial fibrillation: Secondary | ICD-10-CM

## 2011-08-28 DIAGNOSIS — Z7901 Long term (current) use of anticoagulants: Secondary | ICD-10-CM

## 2011-08-28 LAB — POCT INR: INR: 2.8

## 2011-09-03 ENCOUNTER — Encounter: Payer: Medicare Other | Admitting: *Deleted

## 2011-09-19 ENCOUNTER — Encounter: Payer: Self-pay | Admitting: Internal Medicine

## 2011-10-07 ENCOUNTER — Other Ambulatory Visit: Payer: Self-pay | Admitting: Cardiology

## 2011-10-14 ENCOUNTER — Telehealth: Payer: Self-pay | Admitting: *Deleted

## 2011-10-14 NOTE — Telephone Encounter (Signed)
Left patient a message, received refill request from [facility], needs appointment, had one 09/25/2011

## 2011-10-22 ENCOUNTER — Ambulatory Visit (INDEPENDENT_AMBULATORY_CARE_PROVIDER_SITE_OTHER): Payer: Medicare Other | Admitting: *Deleted

## 2011-10-22 DIAGNOSIS — Z7901 Long term (current) use of anticoagulants: Secondary | ICD-10-CM

## 2011-10-22 DIAGNOSIS — I4891 Unspecified atrial fibrillation: Secondary | ICD-10-CM

## 2011-10-22 LAB — POCT INR: INR: 4.3

## 2011-10-22 MED ORDER — WARFARIN SODIUM 5 MG PO TABS: ORAL_TABLET | ORAL | Status: DC

## 2011-12-04 ENCOUNTER — Ambulatory Visit (INDEPENDENT_AMBULATORY_CARE_PROVIDER_SITE_OTHER): Payer: Medicare Other | Admitting: *Deleted

## 2011-12-04 DIAGNOSIS — I4891 Unspecified atrial fibrillation: Secondary | ICD-10-CM

## 2011-12-04 DIAGNOSIS — Z7901 Long term (current) use of anticoagulants: Secondary | ICD-10-CM

## 2011-12-04 LAB — POCT INR: INR: 3.6

## 2011-12-26 ENCOUNTER — Other Ambulatory Visit: Payer: Self-pay | Admitting: *Deleted

## 2011-12-26 DIAGNOSIS — I4891 Unspecified atrial fibrillation: Secondary | ICD-10-CM

## 2011-12-26 MED ORDER — METOPROLOL TARTRATE 50 MG PO TABS: ORAL_TABLET | ORAL | Status: DC

## 2012-01-01 ENCOUNTER — Ambulatory Visit (INDEPENDENT_AMBULATORY_CARE_PROVIDER_SITE_OTHER): Payer: Medicare Other | Admitting: Pharmacist

## 2012-01-01 DIAGNOSIS — I4891 Unspecified atrial fibrillation: Secondary | ICD-10-CM

## 2012-01-01 DIAGNOSIS — Z7901 Long term (current) use of anticoagulants: Secondary | ICD-10-CM

## 2012-01-01 LAB — POCT INR: INR: 4

## 2012-01-05 ENCOUNTER — Other Ambulatory Visit: Payer: Self-pay | Admitting: Internal Medicine

## 2012-01-06 ENCOUNTER — Encounter: Payer: Self-pay | Admitting: *Deleted

## 2012-01-06 NOTE — Patient Instructions (Signed)
Called patient regarding refill request of Potassium PACKET received from local pharmacy.  Called to verify what actually should be filled as patients med list has a different formula on it.  Awaiting callback, [name], CMA

## 2012-01-17 ENCOUNTER — Encounter: Payer: Medicare Other | Admitting: Internal Medicine

## 2012-01-28 ENCOUNTER — Ambulatory Visit (INDEPENDENT_AMBULATORY_CARE_PROVIDER_SITE_OTHER): Payer: Medicare Other | Admitting: *Deleted

## 2012-01-28 DIAGNOSIS — Z7901 Long term (current) use of anticoagulants: Secondary | ICD-10-CM

## 2012-01-28 DIAGNOSIS — I4891 Unspecified atrial fibrillation: Secondary | ICD-10-CM

## 2012-02-13 ENCOUNTER — Encounter: Payer: Self-pay | Admitting: Internal Medicine

## 2012-02-13 ENCOUNTER — Ambulatory Visit (INDEPENDENT_AMBULATORY_CARE_PROVIDER_SITE_OTHER): Payer: Medicare Other | Admitting: Internal Medicine

## 2012-02-13 ENCOUNTER — Encounter: Payer: Self-pay | Admitting: *Deleted

## 2012-02-13 VITALS — BP 128/80 | HR 60 | Ht 69.0 in | Wt 291.0 lb

## 2012-02-13 DIAGNOSIS — Z950 Presence of cardiac pacemaker: Secondary | ICD-10-CM

## 2012-02-13 DIAGNOSIS — I5032 Chronic diastolic (congestive) heart failure: Secondary | ICD-10-CM

## 2012-02-13 DIAGNOSIS — I10 Essential (primary) hypertension: Secondary | ICD-10-CM

## 2012-02-13 DIAGNOSIS — I509 Heart failure, unspecified: Secondary | ICD-10-CM

## 2012-02-13 DIAGNOSIS — I4891 Unspecified atrial fibrillation: Secondary | ICD-10-CM

## 2012-02-13 LAB — PACEMAKER DEVICE OBSERVATION
BATTERY VOLTAGE: 2.8 V
BMOD-0005RV: 95 {beats}/min
RV LEAD AMPLITUDE: 22.4 mv
RV LEAD IMPEDENCE PM: 752 Ohm
RV LEAD THRESHOLD: 0.75 V

## 2012-02-13 NOTE — Assessment & Plan Note (Signed)
She unforthunately continues to gain weight and is up 25% since 18  m ago and has been unable to lose the weight  :(

## 2012-02-13 NOTE — Assessment & Plan Note (Signed)
Well-controlled on current medications 

## 2012-02-13 NOTE — Assessment & Plan Note (Signed)
The patient's device was interrogated.  The information was reviewed. No changes were made in the programming.    

## 2012-02-13 NOTE — Assessment & Plan Note (Signed)
She continues with atrial fibrillation with a relatively rapid ventricular response. Her mean heart rate is in the 80s. Her heart rate however suggests that 10% of her beats probably are faster than 110 which in her as she is largely sedentary reflects probably even mild exertion. Because of that we will plan to modify her metoprolol from 75 twice a day to 200/100

## 2012-02-13 NOTE — Patient Instructions (Signed)
Your physician wants you to follow-up in: 6 months with Kristin/Paula for a device check and 1 year with Dr. Graciela Husbands. You will receive a reminder letter in the mail two months in advance. If you don't receive a letter, please call our office to schedule the follow-up appointment.  Your physician recommends that you continue on your current medications as directed. Please refer to the Current Medication list given to you today.

## 2012-02-13 NOTE — Assessment & Plan Note (Signed)
She is relatively well compensated and is euvolemic. Her atrial fibrillation clearly contributes to her functional incapacity

## 2012-02-13 NOTE — Progress Notes (Signed)
HPI  [name] is a 71 y.o. female seen in followup for pacemaker implanted for tachybradycardia syndrome as well as atrial fibrillation with a somewhat rapid ventricular response. she is status post Pulm Vein isolation at [facility] 2009.   She also has hypertension, obesity as well as sleep apnea  She has had no significant canges in her symptoms  But continues to gain weight      Past Medical History  Diagnosis Date  . Nonischemic cardiomyopathy     history of,  EF 20-25% at left heart cath in 06/2007; echo 2011 had normal LV function  . CHF (congestive heart failure)     history of decompensated CHF with hospitalizations in 05/2007 and 03/2008  . Morbid obesity   . Hyperlipidemia   . Tachy-brady syndrome     status post implant of a medtronic dual-chamber pacemaker in 2001.  explanted in 2010 -- medtronic ADAPTA L dual-chamber pacemaker in 2010.  Marland Kitchen Chronic obstructive pulmonary disease   . Osteoporosis   . Depression   . Obstructive sleep apnea     continuous positive airway pressure  . GERD (gastroesophageal reflux disease)   . Crohn's disease   . Hypothyroidism     treated  . Seasonal allergies   . Pacemaker     Medtronic    Past Surgical History  Procedure Date  . Pulmonary vein isolation 05/12/2008    RFCA atrial fibrillation  . Post ablation pseudoaneurysm     at A fib ablation  . Cardiac catheterization 08/2000    noncritical disease mid RCA, EF preserved  . Cardiac catheterization 05/29/2007    noncritical disease, EF 20-25%  . Pacemaker insertion 2010    medtronic ADAPTA     Current Outpatient Prescriptions  Medication Sig Dispense Refill  . atorvastatin (LIPITOR) 80 MG tablet Take 80 mg by mouth at bedtime.        . B-D INS SYRINGE 0.5CC/31GX5/16 31G X 5/16" 0.5 ML MISC as directed.      Marland Kitchen BONIVA 150 MG tablet Take 1 tablet by mouth Every month.      . Fiber CAPS Take 5 capsules by mouth daily.        . Fluticasone-Salmeterol (ADVAIR  DISKUS) 500-50 MCG/DOSE AEPB Inhale 1 puff into the lungs every 12 (twelve) hours.        Marland Kitchen FREESTYLE LITE test strip as directed.      . furosemide (LASIX) 40 MG tablet TAKE 1 TABLET EVERY MORNING AND TAKE 2 TABLETS EVERY EVENING  270 tablet  1  . insulin glargine (LANTUS) 100 UNIT/ML injection Inject 30 Units into the skin every morning.       . Lancets (FREESTYLE) lancets as directed.      Marland Kitchen levothyroxine (SYNTHROID, LEVOTHROID) 25 MCG tablet Take 25 mcg by mouth daily.        Marland Kitchen loratadine (CLARITIN) 10 MG tablet Take 10 mg by mouth as needed.        Marland Kitchen losartan (COZAAR) 25 MG tablet Take 25 mg by mouth at bedtime.        . metoprolol (LOPRESSOR) 50 MG tablet Take 1 & 1/2 tablets by mouth twice daily  270 tablet  2  . omeprazole (PRILOSEC OTC) 20 MG tablet Take 20 mg by mouth daily.        . potassium & sodium phosphates (PHOS-NAK) 280-160-250 MG PACK Take 1 packet by mouth daily.        . potassium chloride (KLOR-CON) 20  MEQ packet       . sulfaSALAzine (AZULFIDINE) 500 MG tablet Take 1,000 mg by mouth 2 (two) times daily.       Marland Kitchen warfarin (COUMADIN) 5 MG tablet Take as directed by Anticoagulation clinic  180 tablet  1  . DISCONTD: KLOR-CON 20 MEQ packet 1 PACKET DISSOLVED IN LIQUID EVERY DAY  90 packet  1  . DISCONTD: allopurinol (ZYLOPRIM) 100 MG tablet Take 100 mg by mouth daily.          No Known Allergies  Review of Systems negative except from HPI and PMH  Physical Exam BP 128/80  Pulse 60  Ht 5\' 9"  (1.753 m)  Wt 291 lb (131.997 kg)  BMI 42.97 kg/m2 Well developed and well nourished in no acute distress HENT normal E scleral and icterus clear Neck Supple Clear to ausculation irregular rate and rhythm not too fast, no murmurs gallops or rub Soft with active bowel sounds No clubbing cyanosis none Edema Alert and oriented, grossly normal motor and sensory function Skin Warm and Dry    Assessment and  Plan

## 2012-02-21 ENCOUNTER — Ambulatory Visit (INDEPENDENT_AMBULATORY_CARE_PROVIDER_SITE_OTHER): Payer: Medicare Other | Admitting: Pharmacist

## 2012-02-21 DIAGNOSIS — I4891 Unspecified atrial fibrillation: Secondary | ICD-10-CM

## 2012-02-21 DIAGNOSIS — Z7901 Long term (current) use of anticoagulants: Secondary | ICD-10-CM

## 2012-03-16 ENCOUNTER — Ambulatory Visit (INDEPENDENT_AMBULATORY_CARE_PROVIDER_SITE_OTHER): Payer: Medicare Other

## 2012-03-16 DIAGNOSIS — I4891 Unspecified atrial fibrillation: Secondary | ICD-10-CM

## 2012-03-16 DIAGNOSIS — Z7901 Long term (current) use of anticoagulants: Secondary | ICD-10-CM

## 2012-03-16 LAB — POCT INR: INR: 3.6

## 2012-04-04 ENCOUNTER — Other Ambulatory Visit: Payer: Self-pay | Admitting: Internal Medicine

## 2012-04-13 ENCOUNTER — Ambulatory Visit (INDEPENDENT_AMBULATORY_CARE_PROVIDER_SITE_OTHER): Payer: Medicare Other | Admitting: *Deleted

## 2012-04-13 DIAGNOSIS — Z7901 Long term (current) use of anticoagulants: Secondary | ICD-10-CM

## 2012-04-13 DIAGNOSIS — I4891 Unspecified atrial fibrillation: Secondary | ICD-10-CM

## 2012-04-13 LAB — POCT INR: INR: 4.4

## 2012-05-04 ENCOUNTER — Ambulatory Visit (INDEPENDENT_AMBULATORY_CARE_PROVIDER_SITE_OTHER): Payer: Medicare Other

## 2012-05-04 DIAGNOSIS — I4891 Unspecified atrial fibrillation: Secondary | ICD-10-CM

## 2012-05-04 DIAGNOSIS — Z7901 Long term (current) use of anticoagulants: Secondary | ICD-10-CM

## 2012-05-18 ENCOUNTER — Ambulatory Visit (INDEPENDENT_AMBULATORY_CARE_PROVIDER_SITE_OTHER): Payer: Medicare Other | Admitting: *Deleted

## 2012-05-18 DIAGNOSIS — Z7901 Long term (current) use of anticoagulants: Secondary | ICD-10-CM

## 2012-05-18 DIAGNOSIS — I4891 Unspecified atrial fibrillation: Secondary | ICD-10-CM

## 2012-06-04 ENCOUNTER — Other Ambulatory Visit: Payer: Self-pay | Admitting: Family Medicine

## 2012-06-04 ENCOUNTER — Ambulatory Visit
Admission: RE | Admit: 2012-06-04 | Discharge: 2012-06-04 | Disposition: A | Discharge status: Home / Self Care | Payer: Medicare Other | Source: Ambulatory Visit | Attending: Family Medicine | Admitting: Family Medicine

## 2012-06-04 DIAGNOSIS — R0602 Shortness of breath: Secondary | ICD-10-CM

## 2012-06-09 ENCOUNTER — Other Ambulatory Visit: Payer: Self-pay | Admitting: *Deleted

## 2012-06-09 ENCOUNTER — Encounter (INDEPENDENT_AMBULATORY_CARE_PROVIDER_SITE_OTHER): Payer: Medicare Other

## 2012-06-09 ENCOUNTER — Ambulatory Visit (INDEPENDENT_AMBULATORY_CARE_PROVIDER_SITE_OTHER): Payer: Medicare Other | Admitting: Physician Assistant

## 2012-06-09 ENCOUNTER — Ambulatory Visit (INDEPENDENT_AMBULATORY_CARE_PROVIDER_SITE_OTHER): Payer: Medicare Other | Admitting: *Deleted

## 2012-06-09 ENCOUNTER — Encounter: Payer: Self-pay | Admitting: *Deleted

## 2012-06-09 ENCOUNTER — Encounter: Payer: Self-pay | Admitting: Physician Assistant

## 2012-06-09 VITALS — BP 112/76 | HR 93 | Ht 69.0 in | Wt 294.1 lb

## 2012-06-09 DIAGNOSIS — I4891 Unspecified atrial fibrillation: Secondary | ICD-10-CM

## 2012-06-09 DIAGNOSIS — R0609 Other forms of dyspnea: Secondary | ICD-10-CM

## 2012-06-09 DIAGNOSIS — I2510 Atherosclerotic heart disease of native coronary artery without angina pectoris: Secondary | ICD-10-CM

## 2012-06-09 DIAGNOSIS — I5032 Chronic diastolic (congestive) heart failure: Secondary | ICD-10-CM

## 2012-06-09 DIAGNOSIS — Z7901 Long term (current) use of anticoagulants: Secondary | ICD-10-CM

## 2012-06-09 LAB — BASIC METABOLIC PANEL
CO2: 28 mEq/L (ref 19–32)
Calcium: 11.2 mg/dL — ABNORMAL HIGH (ref 8.4–10.5)
Glucose, Bld: 92 mg/dL (ref 70–99)
Sodium: 138 mEq/L (ref 135–145)

## 2012-06-09 LAB — PROTIME-INR
INR: 1.8 ratio — ABNORMAL HIGH (ref 0.8–1.0)
Prothrombin Time: 20.1 s — ABNORMAL HIGH (ref 10.2–12.4)

## 2012-06-09 LAB — CBC WITH DIFFERENTIAL/PLATELET
Basophils Absolute: 0 10*3/uL (ref 0.0–0.1)
Eosinophils Absolute: 0.1 10*3/uL (ref 0.0–0.7)
HCT: 47.3 % — ABNORMAL HIGH (ref 36.0–46.0)
Hemoglobin: 15.7 g/dL — ABNORMAL HIGH (ref 12.0–15.0)
Lymphocytes Relative: 21.2 % (ref 12.0–46.0)
Lymphs Abs: 1.8 10*3/uL (ref 0.7–4.0)
MCHC: 33.2 g/dL (ref 30.0–36.0)
Monocytes Absolute: 0.8 10*3/uL (ref 0.1–1.0)
Neutro Abs: 5.8 10*3/uL (ref 1.4–7.7)
RDW: 14.3 % (ref 11.5–14.6)

## 2012-06-09 MED ORDER — WARFARIN SODIUM 5 MG PO TABS: ORAL_TABLET | ORAL | Status: DC

## 2012-06-09 MED ORDER — METOPROLOL TARTRATE 100 MG PO TABS: ORAL_TABLET | ORAL | Status: DC

## 2012-06-09 MED ORDER — NITROGLYCERIN 0.4 MG SL SUBL: 0.4000 mg | SUBLINGUAL_TABLET | SUBLINGUAL | Status: DC | PRN

## 2012-06-09 NOTE — Progress Notes (Signed)
[address] [place], [place]  [address] Phone: [phone] Fax:  [phone]  Date:  06/09/2012   Name:  [name]   DOB:  Feb 12, 1941   MRN:  [id number]  PCP:  No primary provider on file.  Primary Cardiologist/Primary Electrophysiologist:  Dr. Sherryl Manges    History of Present Illness: [name] is a 71 y.o. female who returns for evaluation of shortness of breath.  She has a h/o NICM with prior EF of 20-25% (LV function has normalized), CAD, atrial fibrillation, tachybradycardia syndrome, status post pacemaker, HTN, sleep apnea, diastolic CHF, hypothyroidism.  She underwent pulmonary vein isolation at [facility] in 2009. Last seen by Dr. Graciela Husbands 5/13. It appears that she was in atrial fibrillation at that time with uncontrolled heart rates. Her beta blocker therapy was adjusted.  Over the last one week, she has noted significantly worsening dyspnea with exertion. This is associated with diaphoresis. She probably describes class IIIB symptoms. She denies symptoms at rest. She denies chest discomfort. She denies orthopnea, PND or significant pedal edema. Her weights at home have been fairly stable. She did see her PCP recently. BNP was normal at 92. Hemoglobin was normal at 15.2. TSH was normal at 1.24. Creatinine was 1.11. Notes from her primary care doctor's office in indicate her chest x-ray was also normal.  Wt Readings from Last 3 Encounters:  02/13/12 291 lb (131.997 kg)  08/05/11 285 lb (129.275 kg)  02/11/11 300 lb (136.079 kg)     Past Medical History  Diagnosis Date  . Nonischemic cardiomyopathy     history of,  EF 20-25% at left heart cath in 06/2007; echo 2011 had normal LV function  . Chronic diastolic heart failure     Echo 06/2010: Moderate LVH, EF 55-65%, normal wall motion, mild MR, moderate to severe LAE, mild RAE.   . Morbid obesity   . Hyperlipidemia   . Tachy-brady syndrome     status post implant of a medtronic  dual-chamber pacemaker in 2001.  explanted in 2010 -- medtronic ADAPTA L dual-chamber pacemaker in 2010.  Marland Kitchen Chronic obstructive pulmonary disease   . Osteoporosis   . Depression   . Obstructive sleep apnea     continuous positive airway pressure  . GERD (gastroesophageal reflux disease)   . Crohn's disease   . Hypothyroidism     treated  . Seasonal allergies   . Pacemaker     Medtronic  . Atrial fibrillation     Status post pulmonary vein isolation 2009 at [facility]  . CAD (coronary artery disease)     LHC 05/2007: pLAD 70-80%, pRCA 40%, EF 20-25%. LAD lesion treated medically.     Past Surgical History  Procedure Date  . Pulmonary vein isolation 05/12/2008    RFCA atrial fibrillation  . Post ablation pseudoaneurysm     at A fib ablation  . Cardiac catheterization 08/2000    noncritical disease mid RCA, EF preserved  . Cardiac catheterization 05/29/2007    noncritical disease, EF 20-25%  . Pacemaker insertion 2010    medtronic ADAPTA     Current Outpatient Prescriptions  Medication Sig Dispense Refill  . atorvastatin (LIPITOR) 80 MG tablet Take 80 mg by mouth at bedtime.        Marland Kitchen BONIVA 150 MG tablet Take 1 tablet by mouth Every month.      . Fiber CAPS Take 5 capsules by mouth daily.        . Fluticasone-Salmeterol (  ADVAIR DISKUS) 500-50 MCG/DOSE AEPB Inhale 1 puff into the lungs every 12 (twelve) hours.        Marland Kitchen FREESTYLE LITE test strip as directed.      . furosemide (LASIX) 40 MG tablet TAKE 1 TABLET EVERY MORNING AND TAKE 2 TABLETS EVERY EVENING  270 tablet  1  . insulin glargine (LANTUS) 100 UNIT/ML injection Inject 30 Units into the skin every morning.       . Lancets (FREESTYLE) lancets as directed.      Marland Kitchen levothyroxine (SYNTHROID, LEVOTHROID) 25 MCG tablet Take 25 mcg by mouth daily.        Marland Kitchen loratadine (CLARITIN) 10 MG tablet Take 10 mg by mouth as needed.       Marland Kitchen losartan (COZAAR) 25 MG tablet Take 25 mg by mouth at bedtime.        . metoprolol (LOPRESSOR) 50  MG tablet Take two tablets in the morning and one tablet in the evening.      Marland Kitchen omeprazole (PRILOSEC OTC) 20 MG tablet Take 20 mg by mouth daily.        . potassium chloride (KLOR-CON) 20 MEQ packet Take 20 mEq by mouth daily.       Marland Kitchen sulfaSALAzine (AZULFIDINE) 500 MG tablet Take 2,000 mg by mouth 2 (two) times daily.       Marland Kitchen warfarin (COUMADIN) 5 MG tablet Take as directed by Anticoagulation clinic  180 tablet  1  . B-D INS SYRINGE 0.5CC/31GX5/16 31G X 5/16" 0.5 ML MISC as directed.      Marland Kitchen DISCONTD: allopurinol (ZYLOPRIM) 100 MG tablet Take 100 mg by mouth daily.          Allergies: No Known Allergies  History  Substance Use Topics  . Smoking status: Former Smoker -- 3.0 packs/day for 32 years  . Smokeless tobacco: Not on file  . Alcohol Use: No     Family History  Problem Relation Age of Onset  . Breast cancer Mother   . Heart disease Father      ROS:  Please see the history of present illness.   She denies fevers, chills, cough, melena, hematochezia. All other systems reviewed and negative.   PHYSICAL EXAM: VS:  BP 112/76  Pulse 93  Ht 5\' 9"  (1.753 m)  Wt 294 lb 1.9 oz (133.412 kg)  BMI 43.43 kg/m2 Well nourished, well developed, in no acute distress HEENT: normal Neck: no JVD at 90 Cardiac:  normal S1, S2; irregularly irregular rhythm; no murmur Lungs:  clear to auscultation bilaterally, no wheezing, rhonchi or rales Abd: soft, nontender Ext: no edema Skin: warm and dry Neuro:  CNs 2-12 intact, no focal abnormalities noted Psych: Normal affect  EKG:  Atrial fibrillation, heart rate 93, rightward axis, nonspecific ST-T wave changes, no change from prior tracing      ASSESSMENT AND PLAN:  1. Dyspnea with Exertion: She is a sudden change in her shortness of breath with exertion. INRs have been therapeutic. Likelihood of pulmonary embolism is extremely low. Recent workup by her PCP is inconsistent with CHF. She had fairly moderate disease in the LAD in 2008 by  cardiac catheterization. I am concerned that her dyspnea with exertion as an anginal equivalent. I discussed this with Dr. Graciela Husbands. He agrees and we have recommended cardiac catheterization.  We will plan right and left heart catheterization. Risks and benefits of cardiac catheterization have been discussed with the patient.  These include bleeding, infection, kidney damage, stroke, heart attack, death.  The patient understands these risks and is willing to proceed.   2. Atrial Fibrillation: Her Coumadin will be held 5 days prior to her cardiac catheterization. We did interrogate her device. Her heart rate is uncontrolled about 20% of the time. We will also increase her metoprolol to 100 mg twice daily.  3. Chronic Diastolic CHF: As noted, her volume appears stable. Proceed with right and left heart catheterization as noted.  4. Hypothyroidism: Recent TSH normal.  Signed, Tereso Newcomer, PA-C  8:57 AM 06/09/2012

## 2012-06-09 NOTE — Patient Instructions (Addendum)
Your physician has requested that you have a cardiac catheterization. Cardiac catheterization is used to diagnose and/or treat various heart conditions. Doctors may recommend this procedure for a number of different reasons. The most common reason is to evaluate chest pain. Chest pain can be a symptom of coronary artery disease (CAD), and cardiac catheterization can show whether plaque is narrowing or blocking your heart's arteries. This procedure is also used to evaluate the valves, as well as measure the blood flow and oxygen levels in different parts of your heart. For further information please visit [email or web address] Please follow instruction sheet, as given.  Your physician recommends that you return for lab work in: TODAY PRE CATH BMET, CBC W/DIFF, PT/INR  Your physician has recommended you make the following change in your medication: INCREASE METOPROLOL 100 MG TWICE DAILY A NEW PRESCRIPTION WAS SENT IN TODAY FOR THE 100 MG TABLET. A PRESCRIPTION FOR NITROGLYCERIN HAS ALSO BEEN SENT IN AND YOU HAVE BEEN ADVISED AS TO HOW AND WHEN TO USE NTG.

## 2012-06-09 NOTE — H&P (Signed)
History and Physical  Date:  06/09/2012   Name:  [name]   DOB:  05-04-1941   MRN:  [id number]  PCP:  No primary provider on file.  Primary Cardiologist/Primary Electrophysiologist:  [name]    History of Present Illness: [name] is a 71 y.o. female who returns for evaluation of shortness of breath.  She has a h/o NICM with prior EF of 20-25% (LV function has normalized), CAD, atrial fibrillation, tachybradycardia syndrome, status post pacemaker, HTN, sleep apnea, diastolic CHF, hypothyroidism.  She underwent pulmonary vein isolation at [facility] in 2009. Last seen by [name] 5/13. It appears that she was in atrial fibrillation at that time with uncontrolled heart rates. Her beta blocker therapy was adjusted.  Over the last one week, she has noted significantly worsening dyspnea with exertion. This is associated with diaphoresis. She probably describes class IIIB symptoms. She denies symptoms at rest. She denies chest discomfort. She denies orthopnea, PND or significant pedal edema. Her weights at home have been fairly stable. She did see her PCP recently. BNP was normal at 92. Hemoglobin was normal at 15.2. TSH was normal at 1.24. Creatinine was 1.11. Notes from her primary care doctor's office in indicate her chest x-ray was also normal.  Wt Readings from Last 3 Encounters:  02/13/12 291 lb (131.997 kg)  08/05/11 285 lb (129.275 kg)  02/11/11 300 lb (136.079 kg)     Past Medical History  Diagnosis Date  . Nonischemic cardiomyopathy     history of,  EF 20-25% at left heart cath in 06/2007; echo 2011 had normal LV function  . Chronic diastolic heart failure     Echo 06/2010: Moderate LVH, EF 55-65%, normal wall motion, mild MR, moderate to severe LAE, mild RAE.   . Morbid obesity   . Hyperlipidemia   . Tachy-brady syndrome     status post implant of a medtronic dual-chamber pacemaker in 2001.  explanted in 2010 -- medtronic ADAPTA L dual-chamber pacemaker  in 2010.  Marland Kitchen Chronic obstructive pulmonary disease   . Osteoporosis   . Depression   . Obstructive sleep apnea     continuous positive airway pressure  . GERD (gastroesophageal reflux disease)   . Crohn's disease   . Hypothyroidism     treated  . Seasonal allergies   . Pacemaker     Medtronic  . Atrial fibrillation     Status post pulmonary vein isolation 2009 at [facility]  . CAD (coronary artery disease)     LHC 05/2007: pLAD 70-80%, pRCA 40%, EF 20-25%. LAD lesion treated medically.     Past Surgical History  Procedure Date  . Pulmonary vein isolation 05/12/2008    RFCA atrial fibrillation  . Post ablation pseudoaneurysm     at A fib ablation  . Cardiac catheterization 08/2000    noncritical disease mid RCA, EF preserved  . Cardiac catheterization 05/29/2007    noncritical disease, EF 20-25%  . Pacemaker insertion 2010    medtronic ADAPTA     Current Outpatient Prescriptions  Medication Sig Dispense Refill  . atorvastatin (LIPITOR) 80 MG tablet Take 80 mg by mouth at bedtime.        Marland Kitchen BONIVA 150 MG tablet Take 1 tablet by mouth Every month.      . Fiber CAPS Take 5 capsules by mouth daily.        . Fluticasone-Salmeterol (ADVAIR DISKUS) 500-50 MCG/DOSE AEPB Inhale 1 puff into the lungs every 12 (twelve)  hours.        Marland Kitchen FREESTYLE LITE test strip as directed.      . furosemide (LASIX) 40 MG tablet TAKE 1 TABLET EVERY MORNING AND TAKE 2 TABLETS EVERY EVENING  270 tablet  1  . insulin glargine (LANTUS) 100 UNIT/ML injection Inject 30 Units into the skin every morning.       . Lancets (FREESTYLE) lancets as directed.      Marland Kitchen levothyroxine (SYNTHROID, LEVOTHROID) 25 MCG tablet Take 25 mcg by mouth daily.        Marland Kitchen loratadine (CLARITIN) 10 MG tablet Take 10 mg by mouth as needed.       Marland Kitchen losartan (COZAAR) 25 MG tablet Take 25 mg by mouth at bedtime.        . metoprolol (LOPRESSOR) 50 MG tablet Take two tablets in the morning and one tablet in the evening.      Marland Kitchen omeprazole  (PRILOSEC OTC) 20 MG tablet Take 20 mg by mouth daily.        . potassium chloride (KLOR-CON) 20 MEQ packet Take 20 mEq by mouth daily.       Marland Kitchen sulfaSALAzine (AZULFIDINE) 500 MG tablet Take 2,000 mg by mouth 2 (two) times daily.       Marland Kitchen warfarin (COUMADIN) 5 MG tablet Take as directed by Anticoagulation clinic  180 tablet  1  . B-D INS SYRINGE 0.5CC/31GX5/16 31G X 5/16" 0.5 ML MISC as directed.      Marland Kitchen DISCONTD: allopurinol (ZYLOPRIM) 100 MG tablet Take 100 mg by mouth daily.          Allergies: No Known Allergies  History  Substance Use Topics  . Smoking status: Former Smoker -- 3.0 packs/day for 32 years  . Smokeless tobacco: Not on file  . Alcohol Use: No     Family History  Problem Relation Age of Onset  . Breast cancer Mother   . Heart disease Father      ROS:  Please see the history of present illness.   She denies fevers, chills, cough, melena, hematochezia. All other systems reviewed and negative.   PHYSICAL EXAM: VS:  BP 112/76  Pulse 93  Ht 5\' 9"  (1.753 m)  Wt 294 lb 1.9 oz (133.412 kg)  BMI 43.43 kg/m2 Well nourished, well developed, in no acute distress HEENT: normal Neck: no JVD at 90 Cardiac:  normal S1, S2; irregularly irregular rhythm; no murmur Lungs:  clear to auscultation bilaterally, no wheezing, rhonchi or rales Abd: soft, nontender Ext: no edema Skin: warm and dry Neuro:  CNs 2-12 intact, no focal abnormalities noted Psych: Normal affect  EKG:  Atrial fibrillation, heart rate 93, rightward axis, nonspecific ST-T wave changes, no change from prior tracing      ASSESSMENT AND PLAN:  1. Dyspnea with Exertion: She is a sudden change in her shortness of breath with exertion. INRs have been therapeutic. Likelihood of pulmonary embolism is extremely low. Recent workup by her PCP is inconsistent with CHF. She had fairly moderate disease in the LAD in 2008 by cardiac catheterization. I am concerned that her dyspnea with exertion as an anginal equivalent. I  discussed this with [name]. He agrees and we have recommended cardiac catheterization.  We will plan right and left heart catheterization. Risks and benefits of cardiac catheterization have been discussed with the patient.  These include bleeding, infection, kidney damage, stroke, heart attack, death.  The patient understands these risks and is willing to proceed.   2. Atrial  Fibrillation: Her Coumadin will be held 5 days prior to her cardiac catheterization. We did interrogate her device. Her heart rate is uncontrolled about 20% of the time. We will also increase her metoprolol to 100 mg twice daily.  3. Chronic Diastolic CHF: As noted, her volume appears stable. Proceed with right and left heart catheterization as noted.  4. Hypothyroidism: Recent TSH normal.  Signed, Tereso Newcomer, PA-C  8:57 AM 06/09/2012

## 2012-06-10 ENCOUNTER — Telehealth: Payer: Self-pay | Admitting: *Deleted

## 2012-06-10 NOTE — Telephone Encounter (Signed)
Message copied by Tarri Fuller on Wed Jun 10, 2012 10:48 AM ------      Message from: [place], [place] T      Created: Tue Jun 09, 2012  5:39 PM       Labs ok for cath      Tereso Newcomer, PA-C  5:39 PM 06/09/2012

## 2012-06-10 NOTE — Telephone Encounter (Signed)
Message copied by Tarri Fuller on Wed Jun 10, 2012 11:00 AM ------      Message from: [place], [place] T      Created: Tue Jun 09, 2012  5:39 PM       Labs ok for cath      Tereso Newcomer, PA-C  5:39 PM 06/09/2012

## 2012-06-10 NOTE — Telephone Encounter (Signed)
lmom labs ok for cath 9/16

## 2012-06-10 NOTE — Telephone Encounter (Signed)
lmom labs ok for cath 

## 2012-06-11 ENCOUNTER — Encounter: Payer: Self-pay | Admitting: Internal Medicine

## 2012-06-11 LAB — PACEMAKER DEVICE OBSERVATION
BMOD-0003RV: 30
BRDY-0004RV: 130 {beats}/min
RV LEAD THRESHOLD: 0.875 V
VENTRICULAR PACING PM: 23.4

## 2012-06-11 NOTE — Progress Notes (Signed)
Pacer check in clinic  

## 2012-06-15 ENCOUNTER — Encounter (HOSPITAL_BASED_OUTPATIENT_CLINIC_OR_DEPARTMENT_OTHER)
Admission: RE | Disposition: A | Discharge status: Home / Self Care | Payer: Self-pay | Source: Ambulatory Visit | Attending: Cardiovascular Disease

## 2012-06-15 ENCOUNTER — Encounter (HOSPITAL_BASED_OUTPATIENT_CLINIC_OR_DEPARTMENT_OTHER): Payer: Self-pay

## 2012-06-15 ENCOUNTER — Inpatient Hospital Stay (HOSPITAL_BASED_OUTPATIENT_CLINIC_OR_DEPARTMENT_OTHER)
Admission: RE | Admit: 2012-06-15 | Discharge: 2012-06-15 | Disposition: A | Discharge status: Home / Self Care | Payer: Medicare Other | Source: Ambulatory Visit | Attending: Cardiovascular Disease | Admitting: Cardiovascular Disease

## 2012-06-15 DIAGNOSIS — I2510 Atherosclerotic heart disease of native coronary artery without angina pectoris: Secondary | ICD-10-CM | POA: Insufficient documentation

## 2012-06-15 DIAGNOSIS — I428 Other cardiomyopathies: Secondary | ICD-10-CM | POA: Insufficient documentation

## 2012-06-15 DIAGNOSIS — I10 Essential (primary) hypertension: Secondary | ICD-10-CM | POA: Insufficient documentation

## 2012-06-15 DIAGNOSIS — I5032 Chronic diastolic (congestive) heart failure: Secondary | ICD-10-CM | POA: Insufficient documentation

## 2012-06-15 DIAGNOSIS — I4891 Unspecified atrial fibrillation: Secondary | ICD-10-CM | POA: Insufficient documentation

## 2012-06-15 DIAGNOSIS — I509 Heart failure, unspecified: Secondary | ICD-10-CM | POA: Insufficient documentation

## 2012-06-15 DIAGNOSIS — Z950 Presence of cardiac pacemaker: Secondary | ICD-10-CM | POA: Insufficient documentation

## 2012-06-15 HISTORY — DX: Atherosclerotic heart disease of native coronary artery without angina pectoris: I25.10

## 2012-06-15 LAB — POCT I-STAT 3, VENOUS BLOOD GAS (G3P V)
O2 Saturation: 63 %
pCO2, Ven: 49.9 mmHg (ref 45.0–50.0)
pH, Ven: 7.366 — ABNORMAL HIGH (ref 7.250–7.300)

## 2012-06-15 LAB — POCT I-STAT 3, ART BLOOD GAS (G3+)

## 2012-06-15 LAB — POCT I-STAT GLUCOSE
Glucose, Bld: 129 mg/dL — ABNORMAL HIGH (ref 70–99)
Operator id: 221371

## 2012-06-15 SURGERY — JV LEFT AND RIGHT HEART CATHETERIZATION WITH CORONARY ANGIOGRAM
Anesthesia: Moderate Sedation

## 2012-06-15 MED ORDER — SODIUM CHLORIDE 0.9 % IV SOLN: INTRAVENOUS | Status: DC

## 2012-06-15 MED ORDER — ASPIRIN 81 MG PO CHEW
81.0000 mg | CHEWABLE_TABLET | ORAL | Status: AC
  Administered 2012-06-15: 81 mg via ORAL

## 2012-06-15 MED ORDER — ACETAMINOPHEN 325 MG PO TABS: 650.0000 mg | ORAL_TABLET | ORAL | Status: DC | PRN

## 2012-06-15 MED ORDER — SODIUM CHLORIDE 0.9 % IV SOLN
INTRAVENOUS | Status: DC
Start: 2012-06-16 — End: 2012-06-15
  Administered 2012-06-15: 09:00:00 via INTRAVENOUS

## 2012-06-15 MED ORDER — ONDANSETRON HCL 4 MG/2ML IJ SOLN: 4.0000 mg | Freq: Four times a day (QID) | INTRAMUSCULAR | Status: DC | PRN

## 2012-06-15 MED ORDER — SODIUM CHLORIDE 0.9 % IV SOLN: 250.0000 mL | INTRAVENOUS | Status: DC | PRN

## 2012-06-15 MED ORDER — SODIUM CHLORIDE 0.9 % IJ SOLN: 3.0000 mL | INTRAMUSCULAR | Status: DC | PRN

## 2012-06-15 MED ORDER — SODIUM CHLORIDE 0.9 % IJ SOLN: 3.0000 mL | Freq: Two times a day (BID) | INTRAMUSCULAR | Status: DC

## 2012-06-15 NOTE — Progress Notes (Signed)
Bedrest begins @ 1115.  Dr. Excell Seltzer in to discuss results with patient.

## 2012-06-15 NOTE — CV Procedure (Signed)
   Cardiac Catheterization Procedure Note  Name: [name] MRN: [id number] DOB: 07/25/1941  Procedure: Right Heart Cath, Left Heart Cath, Selective Coronary Angiography, LV angiography  Indication: NYHA Class 3 CHF with worsening symptoms   Procedural Details: The right groin was prepped, draped, and anesthetized with 1% lidocaine. Using the modified Seldinger technique a 4 French sheath was placed in the right femoral artery and a 6 French sheath was placed in the right femoral vein. A multipurpose catheter was used for the right heart catheterization. Standard protocol was followed for recording of right heart pressures and sampling of oxygen saturations. Fick cardiac output was calculated. Standard Judkins catheters were used for selective coronary angiography and left ventriculography. There were no immediate procedural complications. The patient was transferred to the post catheterization recovery area for further monitoring.  Procedural Findings: Hemodynamics RA mean of 5 RV 42/12 PA 38/15 with a mean of 27 PCWP mean of 10 LV 122/17 AO 123/63 with a mean of 88  Oxygen saturations: PA 63 AO 94  Cardiac Output (Fick) 4.6  Cardiac Index (Fick) 1.9   Coronary angiography: Coronary dominance: right  Left mainstem: Mild calcification, widely patent.  Left anterior descending (LAD): The proximal LAD is severely calcified. There is 70-75% proximal LAD stenosis which is grossly unchanged from the 2008 study. The area of stenosis begins just beyond the first diagonal branch which is moderate in caliber and has no significant disease. However, there is a large distribution second diagonal with 95% ostial stenosis now present. this has markedly progressed since the previous study. There is associated heavy calcification and diffuse disease of the vessel. The mid LAD beyond the diagonal is diffusely diseased without focal stenosis. The distal portion of the LAD is very small in  caliber.   Left circumflex (LCx): the left circumflex supplies 2 obtuse marginals. There is minor nonobstructive disease without significant stenosis.   Right coronary artery (RCA): the RCA has mild diffuse disease. The proximal vessel has her to percent stenosis. The PDA and PLA branches have no significant stenosis. There is no high-grade disease throughout the course of the RCA.  Left ventriculography: Left ventricular systolic function is normal, LVEF is estimated at 55%, there is no significant mitral regurgitation   Final Conclusions:   1. Moderately severe stenosis of the proximal LAD with severe stenosis of the second diagonal 2. Nonobstructive stenosis of the left circumflex and right coronary artery 3. Normal LV function  Recommendations: Will need to consider revascularization options. In my opinion, her anatomy presents challenges for PCI because of the LAD diagonal bifurcation, but also was not optimal for coronary bypass because of small and diseased target vessels. In addition, the patient has long-standing atrial fibrillation and requires chronic anticoagulation. Additional surgical risk is related to her morbid obesity with BMI greater than 40. I will review her films with colleagues and likely will send her for an outpatient cardiac surgery evaluation.  Tonny Bollman 06/15/2012, 11:03 AM

## 2012-06-15 NOTE — Interval H&P Note (Signed)
History and Physical Interval Note:  06/15/2012 10:02 AM  [name]  has presented today for surgery, with the diagnosis of cp  The various methods of treatment have been discussed with the patient and family. After consideration of risks, benefits and other options for treatment, the patient has consented to  Procedure(s) (LRB) with comments: JV LEFT AND RIGHT HEART CATHETERIZATION WITH CORONARY ANGIOGRAM (N/A) as a surgical intervention .  The patient's history has been reviewed, patient examined, no change in status, stable for surgery.  I have reviewed the patient's chart and labs.  Questions were answered to the patient's satisfaction.     Tonny Bollman

## 2012-06-17 ENCOUNTER — Telehealth: Payer: Self-pay | Admitting: Cardiovascular Disease

## 2012-06-17 DIAGNOSIS — I2510 Atherosclerotic heart disease of native coronary artery without angina pectoris: Secondary | ICD-10-CM

## 2012-06-17 NOTE — Telephone Encounter (Signed)
New Problem:    Patient called in wanting to know what the next step is as far as her care is concerned.  Please call back.

## 2012-06-17 NOTE — Telephone Encounter (Signed)
I spoke with the pt and made her aware that Dr Excell Seltzer would like to refer her to [facility] for CABG. Will place order for referral.

## 2012-06-18 NOTE — Telephone Encounter (Signed)
Pt scheduled to see Dr Cornelius Moras on 06/22/12.

## 2012-06-22 ENCOUNTER — Institutional Professional Consult (permissible substitution) (INDEPENDENT_AMBULATORY_CARE_PROVIDER_SITE_OTHER): Payer: Medicare Other | Admitting: Thoracic Surgery (Cardiothoracic Vascular Surgery)

## 2012-06-22 ENCOUNTER — Encounter: Payer: Self-pay | Admitting: Thoracic Surgery (Cardiothoracic Vascular Surgery)

## 2012-06-22 ENCOUNTER — Other Ambulatory Visit: Payer: Self-pay | Admitting: Thoracic Surgery (Cardiothoracic Vascular Surgery)

## 2012-06-22 VITALS — BP 119/88 | HR 83 | Resp 18 | Ht 70.0 in | Wt 291.0 lb

## 2012-06-22 DIAGNOSIS — E119 Type 2 diabetes mellitus without complications: Secondary | ICD-10-CM

## 2012-06-22 DIAGNOSIS — Z01818 Encounter for other preprocedural examination: Secondary | ICD-10-CM

## 2012-06-22 DIAGNOSIS — I5032 Chronic diastolic (congestive) heart failure: Secondary | ICD-10-CM

## 2012-06-22 DIAGNOSIS — Z6841 Body Mass Index (BMI) 40.0 and over, adult: Secondary | ICD-10-CM

## 2012-06-22 DIAGNOSIS — I4891 Unspecified atrial fibrillation: Secondary | ICD-10-CM

## 2012-06-22 DIAGNOSIS — I2510 Atherosclerotic heart disease of native coronary artery without angina pectoris: Secondary | ICD-10-CM

## 2012-06-22 HISTORY — DX: Morbid (severe) obesity due to excess calories: E66.01

## 2012-06-22 HISTORY — DX: Type 2 diabetes mellitus without complications: E11.9

## 2012-06-22 HISTORY — DX: Body Mass Index (BMI) 40.0 and over, adult: Z684

## 2012-06-22 NOTE — Patient Instructions (Signed)
Use sublingual NTG as directed for substernal chest pain

## 2012-06-22 NOTE — Progress Notes (Signed)
[address]            [name] [address]          [id number]     CARDIOTHORACIC SURGERY CONSULTATION REPORT  Referring Provider is Tonny Bollman, MD PCP is Darrow Bussing, MD  Chief Complaint  Patient presents with  . 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  The patient has long-standing exertional shortness of breath and fatigue which has gotten worse recently.  Over the last several weeks the patient seems to have experienced further exacerbation of exertional shortness of breath with periods of severe fatigue and diaphoresis. She's never had any chest pain or chest tightness, although she does report to some recent exacerbation of symptoms of heartburn and indigestion. She was seen by her primary care physician and referred for cardiology followup. Diagnostic cardiac catheterization was performed last week by Dr. Excell Seltzer and notable for single-vessel disease with preserved left ventricular function and coronary anatomy relatively unfavorable for percutaneous coronary intervention. The patient has been referred for surgical consultation.  The patient describes long-standing exertional shortness of breath and fatigue that has gotten considerably worse recently. She denies any history of chest pain chest tightness chest pressure with activity or at rest. She denies any resting shortness of breath, PND, or orthopnea. She has chronic bilateral lower extremity edema. She does describe recent exacerbation of symptoms of heartburn but the symptoms are unique we felt to be related to indigestion and are unrelated to physical activity or stress. The  patient has chronic palpitations without dizzy spells or syncope.  Past Medical History  Diagnosis Date  . Nonischemic cardiomyopathy     history of,  EF 20-25% at left heart cath in 06/2007; echo 2011 had normal LV function  . Chronic diastolic heart failure     Echo 06/2010: Moderate LVH, EF 55-65%, normal wall motion, mild MR, moderate to severe LAE, mild RAE.   . Morbid obesity   . Hyperlipidemia   . Tachy-brady syndrome     status post implant of a medtronic dual-chamber pacemaker in 2001.  explanted in 2010 -- medtronic ADAPTA L dual-chamber pacemaker in 2010.  Marland Kitchen Chronic obstructive pulmonary disease   . Osteoporosis   . Depression   . Obstructive sleep apnea     continuous positive airway pressure  . GERD (gastroesophageal reflux disease)   . Crohn's disease   . Hypothyroidism     treated  . Seasonal allergies   . Pacemaker     Medtronic  . Atrial fibrillation     Status post pulmonary vein isolation 2009 at [facility]  . CAD (coronary artery disease)     LHC 05/2007: pLAD 70-80%, pRCA 40%, EF 20-25%. LAD lesion treated medically.   . Coronary artery disease 06/15/2012  . Atrial fibrillation 12/18/2007    Annotation: refractory Qualifier: Diagnosis of  By: Thad Ranger LPN, Megan    . Morbid obesity with BMI of 40.0-44.9, adult 06/22/2012  . Diabetes mellitus 06/22/2012    Past Surgical History  Procedure Date  . Pulmonary vein isolation 05/12/2008    RFCA atrial fibrillation  . Post ablation pseudoaneurysm  at A fib ablation  . Cardiac catheterization 08/2000    noncritical disease mid RCA, EF preserved  . Cardiac catheterization 05/29/2007    noncritical disease, EF 20-25%  . Pacemaker insertion 2010    medtronic ADAPTA     Family History  Problem Relation Age of Onset  . Breast cancer Mother   . Heart disease Father     Social History History  Substance Use Topics  . Smoking status: Former Smoker -- 3.0 packs/day for 32 years  . Smokeless tobacco: Not on  file  . Alcohol Use: No    Current Outpatient Prescriptions  Medication Sig Dispense Refill  . atorvastatin (LIPITOR) 80 MG tablet Take 80 mg by mouth at bedtime.        Marland Kitchen BONIVA 150 MG tablet Take 1 tablet by mouth Every month.      . Fiber CAPS Take 5 capsules by mouth daily.        . Fluticasone-Salmeterol (ADVAIR DISKUS) 500-50 MCG/DOSE AEPB Inhale 1 puff into the lungs every 12 (twelve) hours.        . furosemide (LASIX) 40 MG tablet TAKE 1 TABLET EVERY MORNING AND TAKE 2 TABLETS EVERY EVENING  270 tablet  1  . insulin glargine (LANTUS) 100 UNIT/ML injection Inject 30 Units into the skin every morning.       . Lancets (FREESTYLE) lancets as directed.      Marland Kitchen levothyroxine (SYNTHROID, LEVOTHROID) 25 MCG tablet Take 25 mcg by mouth daily.        Marland Kitchen loratadine (CLARITIN) 10 MG tablet Take 10 mg by mouth as needed.       Marland Kitchen losartan (COZAAR) 25 MG tablet Take 25 mg by mouth at bedtime.        . metoprolol (LOPRESSOR) 100 MG tablet 1 TABLET TWICE DAILY  60 tablet  11  . nitroGLYCERIN (NITROSTAT) 0.4 MG SL tablet Place 1 tablet (0.4 mg total) under the tongue every 5 (five) minutes as needed for chest pain.  90 tablet  3  . omeprazole (PRILOSEC OTC) 20 MG tablet Take 20 mg by mouth daily.        . potassium chloride (KLOR-CON) 20 MEQ packet Take 20 mEq by mouth daily.       Marland Kitchen sulfaSALAzine (AZULFIDINE) 500 MG tablet Take 2,000 mg by mouth 2 (two) times daily.       Marland Kitchen warfarin (COUMADIN) 5 MG tablet Take as directed by Anticoagulation clinic  150 tablet  1  . B-D INS SYRINGE 0.5CC/31GX5/16 31G X 5/16" 0.5 ML MISC as directed.      Marland Kitchen FREESTYLE LITE test strip as directed.      Marland Kitchen DISCONTD: allopurinol (ZYLOPRIM) 100 MG tablet Take 100 mg by mouth daily.          No Known Allergies    Review of Systems:   General:  norma appetite, decreased energy, no weight gain, no weight loss, no fever  Cardiac:  no chest pain with exertion, no chest pain at rest, + SOB with very mild exertion, no  resting SOB, no PND, no orthopnea, + palpitations, + arrhythmia, longstanding atrial fibrillation, + chronic LE edema, no dizzy spells, no syncope  Respiratory:  + shortness of breath, no home oxygen, no productive cough, no dry cough, no bronchitis, no wheezing, no hemoptysis, no asthma, no pain with inspiration or cough, + sleep apnea, does not use CPAP at night  GI:   no difficulty swallowing, + reflux, + frequent heartburn, NO hiatal  hernia, NO abdominal pain, NO constipation, NO diarrhea, NO hematochezia, NO hematemesis, NO melena  GU:   NO dysuria,  NO frequency, NO urinary tract infection, NO hematuria, NO kidney stones, NO kidney disease  Vascular:  NO pain suggestive of claudication but FATIGUE in both legs with ambulation, NO pain in feet, NO leg cramps, NO varicose veins, NO DVT, NO non-healing foot ulcer  Neuro:   NO stroke, NO TIA's, NO seizures, NO headaches, NO temporary blindness one eye,  NO slurred speech, NO peripheral neuropathy, NO chronic pain, NO instability of gait, NO memory/cognitive dysfunction  Musculoskeletal: + arthritis RIGHT HIP, NO joint swelling, NO myalgias, + difficulty walking, LIMITED mobility   Skin:   no rash, + itching, no skin infections, no pressure sores or ulcerations  Psych:   no anxiety, no depression, no nervousness, no unusual recent stress  Eyes:   no blurry vision, + occasional floaters, no recent vision changes, + wears glasses or contacts  ENT:   + mild hearing loss, no loose or painful teeth, no dentures,  Hematologic:  no easy bruising, no abnormal bleeding, no clotting disorder, no frequent epistaxis, no problems with coumadin therapy  Endocrine:  + diabetes, routinely checks CBG's at home, last Hgb A1c < 6     Physical Exam:   BP 119/88  Pulse 83  Resp 18  Ht 5\' 10"  (1.778 m)  Wt 291 lb (131.997 kg)  BMI 41.75 kg/m2  SpO2 98%  General:  Morbidly obese  well-appearing  HEENT:  Unremarkable   Neck:   no JVD, no bruits, no adenopathy    Chest:   clear to auscultation, symmetrical breath sounds, no wheezes, no rhonchi   CV:   RRR, no murmur   Abdomen:  soft, non-tender, no masses   Extremities:  warm, well-perfused, pulses not palpable, + mild bilateral LE edema  Rectal/GU  Deferred  Neuro:   Grossly non-focal and symmetrical throughout  Skin:   Clean and dry, no rashes, no breakdown   Diagnostic Tests:  Cardiac Catheterization Procedure Note   Name: [name]  MRN: [id number]  DOB: [birth year]  Procedure: Right Heart Cath, Left Heart Cath, Selective Coronary Angiography, LV angiography  Indication: NYHA Class 3 CHF with worsening symptoms  Procedural Details: The right groin was prepped, draped, and anesthetized with 1% lidocaine. Using the modified Seldinger technique a 4 French sheath was placed in the right femoral artery and a 6 French sheath was placed in the right femoral vein. A multipurpose catheter was used for the right heart catheterization. Standard protocol was followed for recording of right heart pressures and sampling of oxygen saturations. Fick cardiac output was calculated. Standard Judkins catheters were used for selective coronary angiography and left ventriculography. There were no immediate procedural complications. The patient was transferred to the post catheterization recovery area for further monitoring.  Procedural Findings:  Hemodynamics  RA mean of 5  RV 42/12  PA 38/15 with a mean of 27  PCWP mean of 10  LV 122/17  AO 123/63 with a mean of 88  Oxygen saturations:  PA 63  AO 94  Cardiac Output (Fick) 4.6  Cardiac Index (Fick) 1.9  Coronary angiography:  Coronary dominance: right  Left mainstem: Mild calcification, widely patent.  Left anterior descending (LAD): The proximal LAD is severely calcified. There is 70-75% proximal LAD stenosis which is grossly unchanged from the 2008 study. The area of stenosis begins just beyond the first diagonal branch which is moderate in caliber  and has no significant disease. However, there is a large distribution second diagonal with 95% ostial stenosis now present. this has markedly progressed since the previous study. There is associated heavy calcification and diffuse disease of the vessel. The mid LAD beyond the diagonal is diffusely diseased without focal stenosis. The distal portion of the LAD is very small in caliber.  Left circumflex (LCx): the left circumflex supplies 2 obtuse marginals. There is minor nonobstructive disease without significant stenosis.  Right coronary artery (RCA): the RCA has mild diffuse disease. The proximal vessel has her to percent stenosis. The PDA and PLA branches have no significant stenosis. There is no high-grade disease throughout the course of the RCA.  Left ventriculography: Left ventricular systolic function is normal, LVEF is estimated at 55%, there is no significant mitral regurgitation  Final Conclusions:  1. Moderately severe stenosis of the proximal LAD with severe stenosis of the second diagonal  2. Nonobstructive stenosis of the left circumflex and right coronary artery  3. Normal LV function  Recommendations: Will need to consider revascularization options. In my opinion, her anatomy presents challenges for PCI because of the LAD diagonal bifurcation, but also was not optimal for coronary bypass because of small and diseased target vessels. In addition, the patient has long-standing atrial fibrillation and requires chronic anticoagulation. Additional surgical risk is related to her morbid obesity with BMI greater than 40. I will review her films with colleagues and likely will send her for an outpatient cardiac surgery evaluation.  Tonny Bollman  06/15/2012, 11:03 AM    Impression:  The patient presents with recent exacerbation of chronic symptoms of exertional shortness of breath and fatigue.  Catheterization demonstrates progression of previously known single-vessel coronary artery  disease involving the left anterior descending coronary artery.  Left ventricular function is well preserved and pulmonary artery pressures were only mildly elevated.  Comorbid medical problems include morbid obesity, chronic diastolic congestive heart failure, long-standing persistent atrial fibrillation on Coumadin therapy, diabetes mellitus, COPD, and obstructive sleep apnea.  On review of the patient's catheterization it should be noted that the left anterior descending coronary artery is relatively small with extremely small caliber terminal portion and what is likely a fairly long intramyocardial segment beyond the takeoff of the diagonal branch. There is high-grade proximal stenosis of the diagonal branch which is new in comparison with the patient's last previous cardiac catheterization. I expect that the diagonal branch would be graftable but it is possible that the distal portion of the left anterior descending coronary artery might not be graftable at the time of surgery. Based upon the relatively small size of both of these vessels, I am skeptical that surgical revascularization would come with any significant long-term survival benefit.  Furthermore, I am also not confident that coronary artery bypass grafting would necessarily improve or relieve the patient's presenting symptoms of exertional shortness of breath. Risks of surgery will be quite high because of the patient's morbid obesity and other medical problems.  If coronary artery bypass grafting were performed is possible that concomitant Maze procedure or clipping of the left atrial appendage could be entertained as options.   Plan:  I discussed matters at length with the patient here in the office today for more than 45 minutes. I'm concerned that the associated risks of surgery are fairly high and potential benefits only modest at best based upon the patient's coronary anatomy. We will obtain a transthoracic echocardiogram and pulmonary  function tests this week and further consult with Dr.  Cooper regarding the relative feasibility and risks associated with possible PCI and stenting. The patient will return in one week for further followup.  In addition, I've instructed the patient to use sublingual nitroglycerin if she has any further episodes of symptoms of heartburn to see whether or not these symptoms may actually be cardiac rather than do to indigestion as she has postulated.    Salvatore Decent. Cornelius Moras, MD 06/22/2012 11:03 AM

## 2012-06-23 ENCOUNTER — Ambulatory Visit (HOSPITAL_COMMUNITY): Payer: Medicare Other | Attending: Cardiology | Admitting: Radiology

## 2012-06-23 ENCOUNTER — Other Ambulatory Visit (HOSPITAL_COMMUNITY): Payer: Self-pay | Admitting: Radiology

## 2012-06-23 ENCOUNTER — Ambulatory Visit (INDEPENDENT_AMBULATORY_CARE_PROVIDER_SITE_OTHER): Payer: Medicare Other | Admitting: *Deleted

## 2012-06-23 DIAGNOSIS — Z7901 Long term (current) use of anticoagulants: Secondary | ICD-10-CM

## 2012-06-23 DIAGNOSIS — I10 Essential (primary) hypertension: Secondary | ICD-10-CM | POA: Insufficient documentation

## 2012-06-23 DIAGNOSIS — I4891 Unspecified atrial fibrillation: Secondary | ICD-10-CM | POA: Insufficient documentation

## 2012-06-23 DIAGNOSIS — I428 Other cardiomyopathies: Secondary | ICD-10-CM | POA: Insufficient documentation

## 2012-06-23 DIAGNOSIS — I509 Heart failure, unspecified: Secondary | ICD-10-CM | POA: Insufficient documentation

## 2012-06-23 DIAGNOSIS — R0602 Shortness of breath: Secondary | ICD-10-CM | POA: Insufficient documentation

## 2012-06-23 DIAGNOSIS — I2510 Atherosclerotic heart disease of native coronary artery without angina pectoris: Secondary | ICD-10-CM | POA: Insufficient documentation

## 2012-06-23 DIAGNOSIS — I369 Nonrheumatic tricuspid valve disorder, unspecified: Secondary | ICD-10-CM | POA: Insufficient documentation

## 2012-06-23 NOTE — Progress Notes (Signed)
Echocardiogram performed.  

## 2012-06-24 ENCOUNTER — Ambulatory Visit (HOSPITAL_COMMUNITY)
Admission: RE | Admit: 2012-06-24 | Discharge: 2012-06-24 | Disposition: A | Discharge status: Home / Self Care | Payer: Medicare Other | Source: Ambulatory Visit | Attending: Thoracic Surgery (Cardiothoracic Vascular Surgery) | Admitting: Thoracic Surgery (Cardiothoracic Vascular Surgery)

## 2012-06-24 DIAGNOSIS — I2510 Atherosclerotic heart disease of native coronary artery without angina pectoris: Secondary | ICD-10-CM | POA: Insufficient documentation

## 2012-06-24 DIAGNOSIS — Z01818 Encounter for other preprocedural examination: Secondary | ICD-10-CM | POA: Insufficient documentation

## 2012-06-24 DIAGNOSIS — J988 Other specified respiratory disorders: Secondary | ICD-10-CM | POA: Insufficient documentation

## 2012-06-24 DIAGNOSIS — Z01812 Encounter for preprocedural laboratory examination: Secondary | ICD-10-CM | POA: Insufficient documentation

## 2012-06-24 LAB — PULMONARY FUNCTION TEST

## 2012-06-24 MED ORDER — ALBUTEROL SULFATE (5 MG/ML) 0.5% IN NEBU
2.5000 mg | INHALATION_SOLUTION | Freq: Once | RESPIRATORY_TRACT | Status: AC
  Administered 2012-06-24: 2.5 mg via RESPIRATORY_TRACT

## 2012-06-29 ENCOUNTER — Ambulatory Visit (INDEPENDENT_AMBULATORY_CARE_PROVIDER_SITE_OTHER): Payer: Medicare Other | Admitting: Thoracic Surgery (Cardiothoracic Vascular Surgery)

## 2012-06-29 VITALS — BP 146/88 | HR 89 | Resp 18 | Ht 70.0 in | Wt 291.0 lb

## 2012-06-29 DIAGNOSIS — Z7189 Other specified counseling: Secondary | ICD-10-CM

## 2012-06-29 DIAGNOSIS — I2510 Atherosclerotic heart disease of native coronary artery without angina pectoris: Secondary | ICD-10-CM

## 2012-06-29 DIAGNOSIS — Z712 Person consulting for explanation of examination or test findings: Secondary | ICD-10-CM

## 2012-06-29 NOTE — Progress Notes (Signed)
[address]            [name] [address]          [phone]     CARDIOTHORACIC SURGERY OFFICE NOTE  Referring Provider is Tonny Bollman, MD PCP is Darrow Bussing, MD   HPI:  Patient returns for followup of single-vessel coronary artery disease. She was originally seen in consultation on 06/22/2012. Since then she reports no new problems or complaints. She underwent echocardiogram and pulmonary function tests.   Current Outpatient Prescriptions  Medication Sig Dispense Refill  . atorvastatin (LIPITOR) 80 MG tablet Take 80 mg by mouth at bedtime.        . B-D INS SYRINGE 0.5CC/31GX5/16 31G X 5/16" 0.5 ML MISC as directed.      Marland Kitchen BONIVA 150 MG tablet Take 1 tablet by mouth Every month.      . Fiber CAPS Take 5 capsules by mouth daily.        . Fluticasone-Salmeterol (ADVAIR DISKUS) 500-50 MCG/DOSE AEPB Inhale 1 puff into the lungs every 12 (twelve) hours.        Marland Kitchen FREESTYLE LITE test strip as directed.      . furosemide (LASIX) 40 MG tablet TAKE 1 TABLET EVERY MORNING AND TAKE 2 TABLETS EVERY EVENING  270 tablet  1  . insulin glargine (LANTUS) 100 UNIT/ML injection Inject 30 Units into the skin every morning.       . Lancets (FREESTYLE) lancets as directed.      Marland Kitchen levothyroxine (SYNTHROID, LEVOTHROID) 25 MCG tablet Take 25 mcg by mouth daily.        Marland Kitchen loratadine (CLARITIN) 10 MG tablet Take 10 mg by mouth as needed.       Marland Kitchen losartan (COZAAR) 25 MG tablet Take 25 mg by mouth at bedtime.        . metoprolol (LOPRESSOR) 100 MG tablet 1 TABLET TWICE DAILY  60 tablet  11  . nitroGLYCERIN (NITROSTAT) 0.4 MG SL tablet Place 1 tablet (0.4 mg total) under the tongue every 5 (five) minutes as needed for chest pain.  90 tablet  3  . omeprazole (PRILOSEC OTC) 20 MG tablet Take 20 mg by mouth daily.        . potassium chloride (KLOR-CON) 20 MEQ packet Take 20 mEq by mouth daily.       Marland Kitchen sulfaSALAzine (AZULFIDINE) 500 MG tablet Take 2,000 mg by mouth 2 (two)  times daily.       Marland Kitchen warfarin (COUMADIN) 5 MG tablet Take as directed by Anticoagulation clinic  150 tablet  1  . DISCONTD: allopurinol (ZYLOPRIM) 100 MG tablet Take 100 mg by mouth daily.            Physical Exam:   BP 146/88  Pulse 89  Resp 18  Ht 5\' 10"  (1.778 m)  Wt 291 lb (131.997 kg)  BMI 41.75 kg/m2  SpO2 96%  General:  Morbidly obese  Chest:   Clear  CV:   Regular rate and rhythm  Incisions:  n/a  Abdomen:  Soft and very obese  Extremities:  Warm and adequately perfused with cyanosis of the toes both feet  Diagnostic Tests:  ------------------------------------------------------------ Transthoracic Echocardiography  Patient:    [name], [name] MR #:       [id number] Study Date: 06/23/2012 Gender:     F Age:        71 Height:  175.3cm Weight:     133.6kg BSA:        2.44m^2 Pt. Status: Room:    SONOGRAPHER  Luvenia Redden, RDCS  ATTENDING    Jens Som, Humphrey Rolls MD  REFERRING    Tressie Stalker MD  PERFORMING   Redge Gainer, [facility] cc:  ------------------------------------------------------------ LV EF: 35%        LV EF: 30% -   35%  ------------------------------------------------------------ Indications:      Atrial fibrillation - 427.31.  Shortness of breath 786.05.  ------------------------------------------------------------ History:   PMH:  Acquired from the patient and from the patient's chart.  Dyspnea.  Atrial fibrillation.  Coronary artery disease.  Congestive heart failure.  Non-ischemic cardiomyopathy.  Risk factors:  Sleep apnea. Hypertension.   ------------------------------------------------------------ Study Conclusions  - Left ventricle: The cavity size was mildly dilated. Wall   thickness was increased in a pattern of mild LVH. Systolic   function was moderately to severely reduced. The estimated   ejection fraction was 35%, in the range of 30% to 35%.   Diffuse hypokinesis. - Mitral valve: Calcified  annulus. - Left atrium: The atrium was moderately dilated. - Right ventricle: Systolic function was moderately reduced. - Pulmonary arteries: Systolic pressure was mildly   increased. PA peak pressure: 35mm Hg (S).  ------------------------------------------------------------ Labs, prior tests, procedures, and surgery: Permanent pacemaker system implantation.  Transthoracic echocardiography.  M-mode, complete 2D, spectral Doppler, and color Doppler.  Height:  Height: 175.3cm. Height: 69in.  Weight:  Weight: 133.6kg. Weight: 294lb.  Body mass index:  BMI: 43.5kg/m^2.  Body surface area:    BSA: 2.53m^2.  Blood pressure:     112/76.  Patient status:  Outpatient.  Location:  [facility]  ------------------------------------------------------------  ------------------------------------------------------------ Left ventricle:  The cavity size was mildly dilated. Wall thickness was increased in a pattern of mild LVH. Systolic function was moderately to severely reduced. The estimated ejection fraction was 35%, in the range of 30% to 35%. Diffuse hypokinesis.  ------------------------------------------------------------ Aortic valve:   Trileaflet; normal thickness leaflets. Mobility was not restricted.  Doppler:  Transvalvular velocity was within the normal range. There was no stenosis.  No regurgitation.  ------------------------------------------------------------ Aorta:  Aortic root: The aortic root was normal in size.  ------------------------------------------------------------ Mitral valve:   Calcified annulus. Mobility was not restricted.  Doppler:  Transvalvular velocity was within the normal range. There was no evidence for stenosis.  No regurgitation.  ------------------------------------------------------------ Left atrium:  The atrium was moderately dilated.  ------------------------------------------------------------ Right ventricle:  The cavity size was  normal. Pacer wire or catheter noted in right ventricle. Systolic function was moderately reduced.  ------------------------------------------------------------ Pulmonic valve:    Doppler:  Transvalvular velocity was within the normal range. There was no evidence for stenosis.   ------------------------------------------------------------ Tricuspid valve:   Structurally normal valve.    Doppler: Transvalvular velocity was within the normal range.  Trivial regurgitation.  ------------------------------------------------------------ Pulmonary artery:   Systolic pressure was mildly increased.   ------------------------------------------------------------ Right atrium:  The atrium was normal in size. Pacer wire or catheter noted in right atrium.  ------------------------------------------------------------ Pericardium:  There was no pericardial effusion.  ------------------------------------------------------------ Systemic veins: Inferior vena cava: The vessel was normal in size.  ------------------------------------------------------------  2D measurements        Normal  Doppler measurements   Normal Left ventricle                 Main pulmonary LVID ED,   57.6 mm  43-52   artery chord,                         Pressure, S    35 mm   =30 PLAX                                             Hg LVID ES,   34.2 mm     23-38   LVOT chord,                         Peak vel, S  56.4 cm/s ------ PLAX                           VTI, S       9.34 cm   ------ FS, chord,   41 %      >29     Tricuspid valve PLAX                           Regurg peak   273 cm/s ------ LVPW, ED   12.1 mm     ------  vel IVS/LVPW   1.01        <1.3    Peak RV-RA     30 mm   ------ ratio, ED                      gradient, S       Hg Ventricular septum             Systemic veins IVS, ED    12.2 mm     ------  Estimated       5 mm   ------ LVOT                           CVP               Hg Diam, S      21 mm      ------  Right ventricle Area       3.46 cm^2   ------  Pressure, S    35 mm   <30 Aorta                                            Hg Root diam,   36 mm     ------ ED Left atrium AP dim       57 mm     ------ AP dim     2.18 cm/m^2 <2.2 index   ------------------------------------------------------------ Prepared and Electronically Authenticated by  Olga Millers 2013-09-24T16:54:04.643    Pulmonary Function Tests  Baseline      Post-bronchodilator  FVC  2.26 L  (64% predicted) FVC  2.36 L  (67% predicted) FEV1  1.47 L  (55% predicted) FEV1  1.47 L  (55% predicted) FEF25-75 0.84 L  (40% predicted) FEF25-75 0.85 L  (41% predicted)  RV  2.47 L  (100% predicted) DLCO  76% predicted     Impression:  The patient presents with chronic symptoms of exertional shortness of breath and  fatigue that have recently gotten some worse. She does not have any chest pain suggestive of angina pectoris. In fact, the patient's occasional episodes of chest discomfort described as heartburn are not relieved by sublingual nitroglycerin but are relieved by antacid therapy.  Catheterization demonstrates single-vessel coronary artery disease involving the left anterior descending coronary artery and the diagonal branch, with extremely small and likely intramyocardial distal portion of the left anterior descending coronary artery. I've reviewed the patient's cath films with 2 of my partners and Dr. Excell Seltzer.  We all agree that based upon the appearance of catheterization and the patient's associated comorbid medical problems the risks associated with coronary artery bypass grafting probably outweigh any potential benefits. I doubt that coronary artery bypass grafting would likely improve the patient's symptoms of exertional shortness of breath and fatigue. I don't feel that coronary artery bypass grafting would come with any potential added long-term survival benefit. He only potential benefits of coronary  artery bypass grafting is probably slightly decreased risk of future myocardial infarction, but even that would be modest given the small size of the terminal branches of the left anterior descending coronary system.    Plan:  I have again reviewed matters at length with the patient and her friend here in the office today. All of her questions been addressed. She will plan to followup with Dr. Excell Seltzer later this month to further discuss whether or not to continue with medical therapy or consider PCI and stenting of the diagonal branch. We have discussed the fact that she might benefit from a structured exercise program and weight loss program. In the future she will call and return to see [place] as needed.   Salvatore Decent. Cornelius Moras, MD 06/29/2012 3:45 PM

## 2012-06-30 ENCOUNTER — Other Ambulatory Visit: Payer: Self-pay | Admitting: Internal Medicine

## 2012-07-03 ENCOUNTER — Ambulatory Visit (INDEPENDENT_AMBULATORY_CARE_PROVIDER_SITE_OTHER): Payer: Medicare Other | Admitting: *Deleted

## 2012-07-03 DIAGNOSIS — Z7901 Long term (current) use of anticoagulants: Secondary | ICD-10-CM

## 2012-07-03 DIAGNOSIS — I4891 Unspecified atrial fibrillation: Secondary | ICD-10-CM

## 2012-07-14 ENCOUNTER — Encounter: Payer: Self-pay | Admitting: Cardiovascular Disease

## 2012-07-14 ENCOUNTER — Ambulatory Visit (INDEPENDENT_AMBULATORY_CARE_PROVIDER_SITE_OTHER): Payer: Medicare Other | Admitting: Cardiovascular Disease

## 2012-07-14 VITALS — BP 124/70 | HR 84 | Ht 70.0 in | Wt 293.4 lb

## 2012-07-14 DIAGNOSIS — I2510 Atherosclerotic heart disease of native coronary artery without angina pectoris: Secondary | ICD-10-CM

## 2012-07-14 NOTE — Patient Instructions (Addendum)
Follow with Dr.Klein as scheduled   Follow with Dr.Cooper as needed

## 2012-07-14 NOTE — Progress Notes (Signed)
HPI:  71 year-old woman presenting for followup evaluation. The patient underwent cardiac catheterization last month. She has a history of nonischemic cardiomyopathy, but LV function has normalized. She has atrial fibrillation and has undergone coronary vein isolation in 2009. She was seen by Tereso Newcomer 06/09/2012 and was noted to have progressive dyspnea with exertion. There was concern that her dyspnea may be an anginal equivalent she was referred for right and left heart catheterization. This demonstrated essentially normal intracardiac hemodynamics. There was moderately severe stenosis of the proximal LAD which was unchanged from her 2008 cardiac catheterization study. However, there is interval development of severe stenosis of the diagonal branch of the LAD. The patient's target vessels are very small, but she was evaluated by Dr. Cornelius Moras with cardiac surgery who felt she was not a candidate for CABG because of her unfavorable coronary anatomy.  She returns today for followup discussion about consideration of PCI. She is having no chest pain or pressure with exertion. She did have a single episode of chest burning and took nitroglycerin without relief. She then took some TUMS and had complete relief of her symptoms. She admits to dyspnea with exertion with no major change over time. She has chronic leg edema. She denies orthopnea, PND, lightheadedness, or syncope.  Outpatient Encounter Prescriptions as of 07/14/2012  Medication Sig Dispense Refill  . atorvastatin (LIPITOR) 80 MG tablet Take 80 mg by mouth at bedtime.        . B-D INS SYRINGE 0.5CC/31GX5/16 31G X 5/16" 0.5 ML MISC as directed.      Marland Kitchen BONIVA 150 MG tablet Take 1 tablet by mouth Every month.      . Fiber CAPS Take 5 capsules by mouth daily.        . Fluticasone-Salmeterol (ADVAIR DISKUS) 500-50 MCG/DOSE AEPB Inhale 1 puff into the lungs every 12 (twelve) hours.        Marland Kitchen FREESTYLE LITE test strip as directed.      . furosemide  (LASIX) 40 MG tablet TAKE 1 TABLET EVERY MORNING AND TAKE 2 TABLETS EVERY EVENING  270 tablet  1  . insulin glargine (LANTUS) 100 UNIT/ML injection Inject 30 Units into the skin every morning.       Marland Kitchen KLOR-CON 20 MEQ packet 1 PACKET DISSOLVED IN LIQUID EVERY DAY  90 packet  1  . Lancets (FREESTYLE) lancets as directed.      Marland Kitchen levothyroxine (SYNTHROID, LEVOTHROID) 25 MCG tablet Take 25 mcg by mouth daily.        Marland Kitchen loratadine (CLARITIN) 10 MG tablet Take 10 mg by mouth as needed.       Marland Kitchen losartan (COZAAR) 25 MG tablet Take 25 mg by mouth at bedtime.        . metoprolol (LOPRESSOR) 100 MG tablet 1 TABLET TWICE DAILY  60 tablet  11  . nitroGLYCERIN (NITROSTAT) 0.4 MG SL tablet Place 1 tablet (0.4 mg total) under the tongue every 5 (five) minutes as needed for chest pain.  90 tablet  3  . omeprazole (PRILOSEC OTC) 20 MG tablet Take 20 mg by mouth daily.        . potassium chloride (KLOR-CON) 20 MEQ packet Take 20 mEq by mouth daily.       Marland Kitchen sulfaSALAzine (AZULFIDINE) 500 MG tablet Take 2,000 mg by mouth 2 (two) times daily.       Marland Kitchen warfarin (COUMADIN) 5 MG tablet Take as directed by Anticoagulation clinic  150 tablet  1    No  Known Allergies  Past Medical History  Diagnosis Date  . Nonischemic cardiomyopathy     history of,  EF 20-25% at left heart cath in 06/2007; echo 2011 had normal LV function  . Chronic diastolic heart failure     Echo 06/2010: Moderate LVH, EF 55-65%, normal wall motion, mild MR, moderate to severe LAE, mild RAE.   . Morbid obesity   . Hyperlipidemia   . Tachy-brady syndrome     status post implant of a medtronic dual-chamber pacemaker in 2001.  explanted in 2010 -- medtronic ADAPTA L dual-chamber pacemaker in 2010.  Marland Kitchen Chronic obstructive pulmonary disease   . Osteoporosis   . Depression   . Obstructive sleep apnea     continuous positive airway pressure  . GERD (gastroesophageal reflux disease)   . Crohn's disease   . Hypothyroidism     treated  . Seasonal  allergies   . Pacemaker     Medtronic  . Atrial fibrillation     Status post pulmonary vein isolation 2009 at [facility]  . CAD (coronary artery disease)     LHC 05/2007: pLAD 70-80%, pRCA 40%, EF 20-25%. LAD lesion treated medically.   . Coronary artery disease 06/15/2012  . Atrial fibrillation 12/18/2007    Annotation: refractory Qualifier: Diagnosis of  By: Thad Ranger LPN, Megan    . Morbid obesity with BMI of 40.0-44.9, adult 06/22/2012  . Diabetes mellitus 06/22/2012    ROS: Negative except as per HPI  BP 124/70  Pulse 84  Ht 5\' 10"  (1.778 m)  Wt 133.085 kg (293 lb 6.4 oz)  BMI 42.10 kg/m2  SpO2 96%  PHYSICAL EXAM: Pt is alert and oriented, NAD Exam deferred today.  ASSESSMENT AND PLAN: Coronary artery disease, native vessel. I have reviewed the patient's cardiac catheterization films again. I had along discussion with the patient about potential management options. She is not a candidate for cardiac surgery. Her coronary disease is essentially limited to the LAD distribution and both the LAD and diagonal branches are small, especially in the distal segments. She would require complex bifurcation PCI and this is certainly not ideal in a patient on chronic warfarin for anticoagulation. We will continue with medical management for now. If she develops anginal symptoms, I would be happy to see her back to consider PCI. She will continue with her regular followup as scheduled with Dr. Graciela Husbands. Her medical regimen is appropriate. She is not on antiplatelet therapy in the setting of chronic anticoagulation.  Tonny Bollman 07/14/2012 12:55 PM

## 2012-07-20 ENCOUNTER — Ambulatory Visit (INDEPENDENT_AMBULATORY_CARE_PROVIDER_SITE_OTHER): Payer: Medicare Other | Admitting: *Deleted

## 2012-07-20 DIAGNOSIS — Z7901 Long term (current) use of anticoagulants: Secondary | ICD-10-CM

## 2012-07-20 DIAGNOSIS — I4891 Unspecified atrial fibrillation: Secondary | ICD-10-CM

## 2012-08-12 ENCOUNTER — Ambulatory Visit (INDEPENDENT_AMBULATORY_CARE_PROVIDER_SITE_OTHER): Payer: Medicare Other | Admitting: *Deleted

## 2012-08-12 ENCOUNTER — Encounter: Payer: Self-pay | Admitting: Internal Medicine

## 2012-08-12 DIAGNOSIS — I5032 Chronic diastolic (congestive) heart failure: Secondary | ICD-10-CM

## 2012-08-12 DIAGNOSIS — Z7901 Long term (current) use of anticoagulants: Secondary | ICD-10-CM

## 2012-08-12 DIAGNOSIS — I4891 Unspecified atrial fibrillation: Secondary | ICD-10-CM

## 2012-08-12 LAB — PACEMAKER DEVICE OBSERVATION
BMOD-0003RV: 30
BRDY-0004RV: 130 {beats}/min
RV LEAD AMPLITUDE: 15.67 mv
VENTRICULAR PACING PM: 29

## 2012-08-12 NOTE — Progress Notes (Signed)
PPM check 

## 2012-09-01 ENCOUNTER — Telehealth: Payer: Self-pay | Admitting: *Deleted

## 2012-09-09 ENCOUNTER — Ambulatory Visit (INDEPENDENT_AMBULATORY_CARE_PROVIDER_SITE_OTHER): Payer: Medicare Other | Admitting: *Deleted

## 2012-09-09 DIAGNOSIS — I4891 Unspecified atrial fibrillation: Secondary | ICD-10-CM

## 2012-09-09 DIAGNOSIS — Z7901 Long term (current) use of anticoagulants: Secondary | ICD-10-CM

## 2012-09-09 LAB — POCT INR: INR: 3.6

## 2012-10-06 ENCOUNTER — Ambulatory Visit (INDEPENDENT_AMBULATORY_CARE_PROVIDER_SITE_OTHER): Payer: Medicare Other

## 2012-10-06 DIAGNOSIS — I4891 Unspecified atrial fibrillation: Secondary | ICD-10-CM

## 2012-10-06 DIAGNOSIS — Z7901 Long term (current) use of anticoagulants: Secondary | ICD-10-CM

## 2012-10-12 ENCOUNTER — Other Ambulatory Visit: Payer: Self-pay | Admitting: Family Medicine

## 2012-10-19 ENCOUNTER — Other Ambulatory Visit: Payer: Self-pay | Admitting: Physician Assistant

## 2012-10-21 ENCOUNTER — Ambulatory Visit
Admission: RE | Admit: 2012-10-21 | Discharge: 2012-10-21 | Disposition: A | Discharge status: Home / Self Care | Payer: Medicare Other | Source: Ambulatory Visit | Attending: Family Medicine | Admitting: Family Medicine

## 2012-10-21 ENCOUNTER — Other Ambulatory Visit: Payer: Self-pay | Admitting: Internal Medicine

## 2012-10-30 ENCOUNTER — Ambulatory Visit (INDEPENDENT_AMBULATORY_CARE_PROVIDER_SITE_OTHER): Payer: Medicare Other | Admitting: *Deleted

## 2012-10-30 DIAGNOSIS — I4891 Unspecified atrial fibrillation: Secondary | ICD-10-CM

## 2012-10-30 DIAGNOSIS — Z7901 Long term (current) use of anticoagulants: Secondary | ICD-10-CM

## 2012-12-01 ENCOUNTER — Encounter: Payer: Self-pay | Admitting: Internal Medicine

## 2012-12-01 ENCOUNTER — Ambulatory Visit (INDEPENDENT_AMBULATORY_CARE_PROVIDER_SITE_OTHER): Payer: Medicare Other | Admitting: Internal Medicine

## 2012-12-01 ENCOUNTER — Ambulatory Visit (INDEPENDENT_AMBULATORY_CARE_PROVIDER_SITE_OTHER): Payer: Medicare Other | Admitting: *Deleted

## 2012-12-01 VITALS — BP 115/80 | HR 83 | Ht 70.0 in | Wt 291.0 lb

## 2012-12-01 LAB — PACEMAKER DEVICE OBSERVATION
BMOD-0003RV: 30
BRDY-0004RV: 130 {beats}/min
VENTRICULAR PACING PM: 28

## 2012-12-01 MED ORDER — APIXABAN 5 MG PO TABS: 5.0000 mg | ORAL_TABLET | Freq: Two times a day (BID) | ORAL | Status: DC

## 2012-12-01 MED ORDER — METOPROLOL TARTRATE 100 MG PO TABS: ORAL_TABLET | ORAL | Status: DC

## 2012-12-01 NOTE — Assessment & Plan Note (Signed)
Well controlled 

## 2012-12-01 NOTE — Assessment & Plan Note (Signed)
The patient's device was interrogated.  The information was reviewed. No changes were made in the programming.    

## 2012-12-01 NOTE — Progress Notes (Signed)
HPI  [name] is a 72 y.o. female seen in followup for pacemaker implanted for tachybradycardia syndrome as well as atrial fibrillation with a somewhat rapid ventricular response. she is status post Pulm Vein isolation at [facility] 2009.   She also has hypertension, obesity as well as sleep apnea  In the fall, she saw Tereso Newcomer with complaints of exercise intolerance. She is referred for right and left heart catheterization demonstrating stable stenosis of the proximal LAD and interval worsening of a diagonal branch. He saw Dr. Excell Seltzer 10/13 to discuss PCI was elected because of the complex anatomy to pursue medical therapy. She is on anticoagulation so antiplatelet therapy was initiated  She continues to struggle with exercise tolerance.  She also asks as to whether she can be on a NOAC      Past Medical History  Diagnosis Date  . Nonischemic cardiomyopathy     history of,  EF 20-25% at left heart cath in 06/2007; echo 2011 had normal LV function  . Chronic diastolic heart failure     Echo 06/2010: Moderate LVH, EF 55-65%, normal wall motion, mild MR, moderate to severe LAE, mild RAE.   . Morbid obesity   . Hyperlipidemia   . Tachy-brady syndrome     status post implant of a medtronic dual-chamber pacemaker in 2001.  explanted in 2010 -- medtronic ADAPTA L dual-chamber pacemaker in 2010.  Marland Kitchen Chronic obstructive pulmonary disease   . Osteoporosis   . Depression   . Obstructive sleep apnea     continuous positive airway pressure  . GERD (gastroesophageal reflux disease)   . Crohn's disease   . Hypothyroidism     treated  . Seasonal allergies   . Pacemaker     Medtronic  . Atrial fibrillation     Status post pulmonary vein isolation 2009 at [facility]  . CAD (coronary artery disease)     LHC 05/2007: pLAD 70-80%, pRCA 40%, EF 20-25%. LAD lesion treated medically.   . Coronary artery disease 06/15/2012  . Atrial fibrillation 12/18/2007    Annotation:  refractory Qualifier: Diagnosis of  By: Thad Ranger LPN, Megan    . Morbid obesity with BMI of 40.0-44.9, adult 06/22/2012  . Diabetes mellitus 06/22/2012    Past Surgical History  Procedure Laterality Date  . Pulmonary vein isolation  05/12/2008    RFCA atrial fibrillation  . Post ablation pseudoaneurysm      at A fib ablation  . Cardiac catheterization  08/2000    noncritical disease mid RCA, EF preserved  . Cardiac catheterization  05/29/2007    noncritical disease, EF 20-25%  . Pacemaker insertion  2010    medtronic ADAPTA     Current Outpatient Prescriptions  Medication Sig Dispense Refill  . atorvastatin (LIPITOR) 80 MG tablet Take 80 mg by mouth at bedtime.        . B-D INS SYRINGE 0.5CC/31GX5/16 31G X 5/16" 0.5 ML MISC as directed.      Marland Kitchen BONIVA 150 MG tablet Take 1 tablet by mouth Every month.      . Fiber CAPS Take 5 capsules by mouth daily.        . Fluticasone-Salmeterol (ADVAIR DISKUS) 500-50 MCG/DOSE AEPB Inhale 1 puff into the lungs every 12 (twelve) hours.        Marland Kitchen FREESTYLE LITE test strip as directed.      . furosemide (LASIX) 40 MG tablet TAKE 1 TABLET EVERY MORNING AND TAKE 2 TABLETS EVERY EVENING  270  tablet  1  . insulin glargine (LANTUS) 100 UNIT/ML injection Inject 30 Units into the skin every morning.       Marland Kitchen KLOR-CON 20 MEQ packet 1 PACKET DISSOLVED IN LIQUID EVERY DAY  90 packet  1  . Lancets (FREESTYLE) lancets as directed.      Marland Kitchen levothyroxine (SYNTHROID, LEVOTHROID) 25 MCG tablet Take 25 mcg by mouth daily.        Marland Kitchen loratadine (CLARITIN) 10 MG tablet Take 10 mg by mouth as needed.       Marland Kitchen losartan (COZAAR) 25 MG tablet Take 25 mg by mouth at bedtime.        . metoprolol (LOPRESSOR) 100 MG tablet TAKE 1 TABLET BY MOUTH TWICE A DAY  180 tablet  1  . nitroGLYCERIN (NITROSTAT) 0.4 MG SL tablet Place 1 tablet (0.4 mg total) under the tongue every 5 (five) minutes as needed for chest pain.  90 tablet  3  . omeprazole (PRILOSEC OTC) 20 MG tablet Take 20 mg by mouth  daily.        . potassium chloride (KLOR-CON) 20 MEQ packet Take 20 mEq by mouth daily.       Marland Kitchen sulfaSALAzine (AZULFIDINE) 500 MG tablet Take 2,000 mg by mouth 2 (two) times daily.       Marland Kitchen warfarin (COUMADIN) 5 MG tablet Take as directed by Anticoagulation clinic  150 tablet  1  . [DISCONTINUED] allopurinol (ZYLOPRIM) 100 MG tablet Take 100 mg by mouth daily.         No current facility-administered medications for this visit.    Allergies  Allergen Reactions  . Crestor (Rosuvastatin) Nausea Only    Review of Systems negative except from HPI and PMH  Physical Exam BP 115/80  Pulse 83  Ht 5\' 10"  (1.778 m)  Wt 291 lb (131.997 kg)  BMI 41.75 kg/m2 Well developed and well nourished in no acute distress HENT normal E scleral and icterus clear Neck Supple Clear to ausculation irregular rate and rhythm not too fast, no murmurs gallops or rub Soft with active bowel sounds No clubbing cyanosis none Edema Alert and oriented, grossly normal motor and sensory function Skin Warm and Dry  ECG demonstrates atrial fibrillation at 83 Intervals-/10/41 Axis is rightward 136 Poor R-wave progression Isolated PVC  Assessment and  Plan

## 2012-12-01 NOTE — Assessment & Plan Note (Signed)
Stable   Might consider adding nitrogycerine

## 2012-12-01 NOTE — Assessment & Plan Note (Signed)
This may be aggravated by rapid rates. We will plan to augment her beta blockers

## 2012-12-01 NOTE — Assessment & Plan Note (Signed)
The patient would like to be changed to a NOAC. We discussed risks and benefits. Will start her on apixaban. Her INR today is 3.7. I've asked her that she stop her Coumadin for 3 days and begin her apixaban on Friday.  Her heart rates are relatively fast. About 50% of the beats are faster than 100 beats per minute likely secondary to inadequate rate control. She is currently taking metoprolol 100 twice a day. We'll increase this to 150/100

## 2012-12-01 NOTE — Patient Instructions (Signed)
Your physician has recommended you make the following change in your medication:  1) Stop warfarin 2) Start eliquis 5 mg one tablet by mouth twice daily - starting Friday 3/7 3) Increase metoprolol tartrate to 100 mg 1 & 1/2 tablets in the morning and 1 tablet in the evening.  Your physician wants you to follow-up in: 6 months with Kristin/ Gunnar Fusi for a device check & 1 year with Dr. Graciela Husbands. You will receive a reminder letter in the mail two months in advance. If you don't receive a letter, please call our office to schedule the follow-up appointment.

## 2013-01-07 ENCOUNTER — Telehealth: Payer: Self-pay | Admitting: *Deleted

## 2013-01-07 NOTE — Telephone Encounter (Signed)
Spoke with [facility], prior auth completed and approved on 01-07-13 for eliquis 5 mg bid. Pharm made aware.

## 2013-01-08 NOTE — Telephone Encounter (Signed)
Closing encounter/kwm  

## 2013-01-11 ENCOUNTER — Ambulatory Visit (INDEPENDENT_AMBULATORY_CARE_PROVIDER_SITE_OTHER): Payer: 59

## 2013-01-11 DIAGNOSIS — Z7901 Long term (current) use of anticoagulants: Secondary | ICD-10-CM

## 2013-01-11 DIAGNOSIS — I4891 Unspecified atrial fibrillation: Secondary | ICD-10-CM

## 2013-01-11 NOTE — Patient Instructions (Signed)

## 2013-01-11 NOTE — Progress Notes (Signed)
Pt was started on Eliquis for afib on 12/01/12 by Dr Graciela Husbands 5mg  BID   Reviewed patients medication list.  Pt is not currently on any combined P-gp and strong CYP3A4 inhibitors/inducers (ketoconazole, traconazole, ritonavir, carbamazepine, phenytoin, rifampin, St. John's wort).  Reviewed labs.  SCr 1.0 on 01/11/13, Weight 132kg.  Dose is appropriate based on age, weight at serum creatine.   Hgb and HCT 15.5/46.5 on 01/11/13 WNL.

## 2013-01-12 LAB — CBC
Hemoglobin: 15.5 g/dL — ABNORMAL HIGH (ref 12.0–15.0)
MCHC: 33.4 g/dL (ref 30.0–36.0)
MCV: 97.4 fl (ref 78.0–100.0)
RDW: 14.4 % (ref 11.5–14.6)

## 2013-01-12 LAB — BASIC METABOLIC PANEL
BUN: 16 mg/dL (ref 6–23)
CO2: 24 mEq/L (ref 19–32)
Calcium: 10.3 mg/dL (ref 8.4–10.5)
GFR: 59.91 mL/min — ABNORMAL LOW (ref >60.00)
Glucose, Bld: 165 mg/dL — ABNORMAL HIGH (ref 70–99)

## 2013-02-16 ENCOUNTER — Ambulatory Visit: Payer: Self-pay | Admitting: Cardiology

## 2013-03-09 ENCOUNTER — Other Ambulatory Visit: Payer: Self-pay | Admitting: Internal Medicine

## 2013-03-11 ENCOUNTER — Other Ambulatory Visit: Payer: Self-pay | Admitting: Internal Medicine

## 2013-04-16 ENCOUNTER — Other Ambulatory Visit: Payer: Self-pay | Admitting: Cardiovascular Disease

## 2013-06-03 ENCOUNTER — Ambulatory Visit (INDEPENDENT_AMBULATORY_CARE_PROVIDER_SITE_OTHER): Payer: 59 | Admitting: *Deleted

## 2013-06-03 DIAGNOSIS — Z950 Presence of cardiac pacemaker: Secondary | ICD-10-CM

## 2013-06-03 DIAGNOSIS — I4891 Unspecified atrial fibrillation: Secondary | ICD-10-CM

## 2013-06-03 LAB — PACEMAKER DEVICE OBSERVATION
BMOD-0003RV: 30
BRDY-0002RV: 60 {beats}/min
BRDY-0004RV: 130 {beats}/min
RV LEAD THRESHOLD: 0.75 V
VENTRICULAR PACING PM: 29

## 2013-06-03 NOTE — Progress Notes (Signed)
MDT ppm check in clinic. All functions normal, no changes made, full details in [employer].  ROV w/ Dr. Graciela Husbands in 6 mo.

## 2013-07-05 ENCOUNTER — Encounter: Payer: Self-pay | Admitting: Internal Medicine

## 2013-09-30 DIAGNOSIS — I739 Peripheral vascular disease, unspecified: Secondary | ICD-10-CM

## 2013-09-30 HISTORY — DX: Peripheral vascular disease, unspecified: I73.9

## 2013-10-15 ENCOUNTER — Other Ambulatory Visit: Payer: Self-pay | Admitting: Internal Medicine

## 2013-11-09 ENCOUNTER — Other Ambulatory Visit: Payer: Self-pay | Admitting: Internal Medicine

## 2013-12-04 ENCOUNTER — Other Ambulatory Visit: Payer: Self-pay | Admitting: Internal Medicine

## 2014-01-07 ENCOUNTER — Encounter: Payer: Self-pay | Admitting: *Deleted

## 2014-01-26 ENCOUNTER — Other Ambulatory Visit: Payer: Self-pay | Admitting: Internal Medicine

## 2014-01-29 ENCOUNTER — Other Ambulatory Visit: Payer: Self-pay | Admitting: Internal Medicine

## 2014-02-04 ENCOUNTER — Other Ambulatory Visit: Payer: Self-pay | Admitting: Internal Medicine

## 2014-02-07 ENCOUNTER — Telehealth: Payer: Self-pay | Admitting: *Deleted

## 2014-02-07 NOTE — Telephone Encounter (Signed)
PA to [facility] for ELIQUIS 

## 2014-02-08 ENCOUNTER — Other Ambulatory Visit: Payer: Self-pay | Admitting: Internal Medicine

## 2014-02-08 NOTE — Telephone Encounter (Signed)
[facility] approved eliquis through 02/08/2015, [place] # [id number]

## 2014-02-10 ENCOUNTER — Other Ambulatory Visit: Payer: Self-pay | Admitting: *Deleted

## 2014-02-10 MED ORDER — POTASSIUM CHLORIDE CRYS ER 20 MEQ PO TBCR: 20.0000 meq | EXTENDED_RELEASE_TABLET | Freq: Two times a day (BID) | ORAL | Status: DC

## 2014-02-24 ENCOUNTER — Other Ambulatory Visit: Payer: Self-pay | Admitting: Internal Medicine

## 2014-02-28 ENCOUNTER — Ambulatory Visit (INDEPENDENT_AMBULATORY_CARE_PROVIDER_SITE_OTHER): Payer: 59 | Admitting: Pharmacist

## 2014-02-28 DIAGNOSIS — Z79899 Other long term (current) drug therapy: Secondary | ICD-10-CM

## 2014-02-28 DIAGNOSIS — I4891 Unspecified atrial fibrillation: Secondary | ICD-10-CM

## 2014-02-28 MED ORDER — APIXABAN 5 MG PO TABS: 5.0000 mg | ORAL_TABLET | Freq: Two times a day (BID) | ORAL | Status: DC

## 2014-02-28 NOTE — Progress Notes (Signed)
Pt was started on Eliquis 5 mg bid for AFib on 01/2013.  Reviewed patients medication list.  Pt is not currently on any combined P-gp and strong CYP3A4 inhibitors/inducers (ketoconazole, traconazole, ritonavir, carbamazepine, phenytoin, rifampin, St. John's wort).  Reviewed labs.  SCr 1.3, Weight 292 lbs, CrCl- 80 ml/min.  Dose appropriate based on Scr, age, and weight.   Hgb and HCT appropriate.  Patient notified of labs, and to continue Eliquis 5 mg bid.   Patient tolerating Eliquis 5 mg bid well.  Has been compliant with regimen.  Would like to change her prescription to 90 day supply.  Due to see Dr. Caryl Comes next week.  A full discussion of the nature of anticoagulants has been carried out.  A benefit/risk analysis has been presented to the patient, so that they understand the justification for choosing anticoagulation with Eliquis at this time.  The need for compliance is stressed.  Pt is aware to take the medication twice daily.  Side effects of potential bleeding are discussed, including unusual colored urine or stools, coughing up blood or coffee ground emesis, nose bleeds or serious fall or head trauma.  Discussed signs and symptoms of stroke. The patient should avoid any OTC items containing aspirin or ibuprofen.  Avoid alcohol consumption.   Call if any signs of abnormal bleeding.  Discussed financial obligations and resolved any difficulty in obtaining medication.  Next lab test test in 6 months.

## 2014-03-01 LAB — CBC
HCT: 53.3 % — ABNORMAL HIGH (ref 36.0–46.0)
HEMOGLOBIN: 17.4 g/dL — AB (ref 12.0–15.0)
MCHC: 32.6 g/dL (ref 30.0–36.0)
MCV: 97.9 fl (ref 78.0–100.0)
Platelets: 302 10*3/uL (ref 150.0–400.0)
RBC: 5.44 Mil/uL — ABNORMAL HIGH (ref 3.87–5.11)
RDW: 15.1 % (ref 11.5–15.5)
WBC: 9.4 10*3/uL (ref 4.0–10.5)

## 2014-03-01 LAB — BASIC METABOLIC PANEL
BUN: 20 mg/dL (ref 6–23)
CHLORIDE: 102 meq/L (ref 96–112)
CO2: 29 mEq/L (ref 19–32)
Calcium: 11.1 mg/dL — ABNORMAL HIGH (ref 8.4–10.5)
Creatinine, Ser: 1.3 mg/dL — ABNORMAL HIGH (ref 0.4–1.2)
GFR: 41.85 mL/min — ABNORMAL LOW (ref >60.00)
GLUCOSE: 149 mg/dL — AB (ref 70–99)
Potassium: 4.5 mEq/L (ref 3.5–5.1)
Sodium: 139 mEq/L (ref 135–145)

## 2014-03-08 ENCOUNTER — Telehealth: Payer: Self-pay | Admitting: *Deleted

## 2014-03-08 NOTE — Telephone Encounter (Signed)
called and left a msg for her to call either myself or Sherri back for lab results

## 2014-03-09 ENCOUNTER — Ambulatory Visit (INDEPENDENT_AMBULATORY_CARE_PROVIDER_SITE_OTHER): Payer: 59 | Admitting: Internal Medicine

## 2014-03-09 ENCOUNTER — Encounter: Payer: Self-pay | Admitting: Internal Medicine

## 2014-03-09 VITALS — BP 144/90 | HR 79 | Ht 71.0 in | Wt 286.0 lb

## 2014-03-09 DIAGNOSIS — I5032 Chronic diastolic (congestive) heart failure: Secondary | ICD-10-CM

## 2014-03-09 DIAGNOSIS — I4891 Unspecified atrial fibrillation: Secondary | ICD-10-CM

## 2014-03-09 DIAGNOSIS — Z950 Presence of cardiac pacemaker: Secondary | ICD-10-CM

## 2014-03-09 DIAGNOSIS — I495 Sick sinus syndrome: Secondary | ICD-10-CM

## 2014-03-09 LAB — MDC_IDC_ENUM_SESS_TYPE_INCLINIC
Battery Impedance: 252 Ohm
Battery Remaining Longevity: 139 mo
Battery Voltage: 2.8 V
Date Time Interrogation Session: 20150610161219
Lead Channel Pacing Threshold Amplitude: 1 V
Lead Channel Pacing Threshold Pulse Width: 0.4 ms
Lead Channel Sensing Intrinsic Amplitude: 15.67 mV
Lead Channel Setting Pacing Amplitude: 2.5 V
Lead Channel Setting Sensing Sensitivity: 5.6 mV
MDC IDC MSMT LEADCHNL RA IMPEDANCE VALUE: 67 Ohm
MDC IDC MSMT LEADCHNL RV IMPEDANCE VALUE: 642 Ohm
MDC IDC SET LEADCHNL RV PACING PULSEWIDTH: 0.4 ms
MDC IDC STAT BRADY RV PERCENT PACED: 34 %

## 2014-03-09 MED ORDER — POTASSIUM CHLORIDE CRYS ER 20 MEQ PO TBCR: 20.0000 meq | EXTENDED_RELEASE_TABLET | Freq: Two times a day (BID) | ORAL | Status: DC

## 2014-03-09 MED ORDER — APIXABAN 5 MG PO TABS: 5.0000 mg | ORAL_TABLET | Freq: Two times a day (BID) | ORAL | Status: DC

## 2014-03-09 NOTE — Progress Notes (Signed)
Patient Care Team: Dibas Koirala, MD as PCP - General (Family Medicine) Dibas Koirala, MD (Family Medicine)   HPI  [name] is a 73 y.o. female seen in followup for pacemaker implanted for tachybradycardia syndrome as well as atrial fibrillation with a somewhat rapid ventricular response.    she is status post Pulm Vein isolation at [facility] 2009.  She also has hypertension, obesity as well as sleep apnea   Right and left heart catheterization demonstrating stable stenosis of the proximal LAD and interval worsening of a diagonal branch. She saw Dr. Burt Knack 10/13 to discuss PCI and it was elected because of the complex anatomy to pursue medical therapy.   She has had variable assessment of LV function. Most recent echo 9/13 EF 30-35%. Global 4 chamber hypokinesis  She is on anticoagulation so antiplatelet therapy was not initiated   She continues to  Struggle with DOE     Past Medical History  Diagnosis Date  . Nonischemic cardiomyopathy     history of,  EF 20-25% at left heart cath in 06/2007; echo 2011 had normal LV function  . Chronic diastolic heart failure     Echo 06/2010: Moderate LVH, EF 55-65%, normal wall motion, mild MR, moderate to severe LAE, mild RAE.   . Morbid obesity   . Hyperlipidemia   . Tachy-brady syndrome     status post implant of a medtronic dual-chamber pacemaker in 2001.  explanted in 2010 -- [place] pacemaker in 2010.  Marland Kitchen Chronic obstructive pulmonary disease   . Osteoporosis   . Depression   . Obstructive sleep apnea     continuous positive airway pressure  . GERD (gastroesophageal reflux disease)   . Crohn's disease   . Hypothyroidism     treated  . Seasonal allergies   . Pacemaker     Medtronic  . Atrial fibrillation     Status post pulmonary vein isolation 2009 at [facility]  . CAD (coronary artery disease)     LHC 05/2007: pLAD 70-80%, pRCA 40%, EF 20-25%. LAD lesion treated medically.     . Coronary artery disease 06/15/2012  . Atrial fibrillation 12/18/2007    Annotation: refractory Qualifier: Diagnosis of  By: Doy Mince LPN, Megan    . Morbid obesity with BMI of 40.0-44.9, adult 06/22/2012  . Diabetes mellitus 06/22/2012    Past Surgical History  Procedure Laterality Date  . Pulmonary vein isolation  05/12/2008    RFCA atrial fibrillation  . Post ablation pseudoaneurysm      at A fib ablation  . Cardiac catheterization  08/2000    noncritical disease mid RCA, EF preserved  . Cardiac catheterization  05/29/2007    noncritical disease, EF 20-25%  . Pacemaker insertion  2010    medtronic ADAPTA     Current Outpatient Prescriptions  Medication Sig Dispense Refill  . apixaban (ELIQUIS) 5 MG TABS tablet Take 1 tablet (5 mg total) by mouth 2 (two) times daily.  180 tablet  3  . atorvastatin (LIPITOR) 80 MG tablet Take 80 mg by mouth at bedtime.        . B-D INS SYRINGE 0.5CC/31GX5/16 31G X 5/16" 0.5 ML MISC as directed.      Marland Kitchen BONIVA 150 MG tablet Take 1 tablet by mouth Every month.      . Fiber CAPS Take 5 capsules by mouth daily.        . Fluticasone-Salmeterol (ADVAIR DISKUS) 500-50 MCG/DOSE AEPB Inhale  1 puff into the lungs every 12 (twelve) hours.        Marland Kitchen FREESTYLE LITE test strip as directed.      . furosemide (LASIX) 40 MG tablet TAKE 1 TABLET BY MOUTH EVERY MORNING AND TAKE 2 TABLETS EVERY EVENING  270 tablet  0  . insulin glargine (LANTUS) 100 UNIT/ML injection Inject 30 Units into the skin every morning.       . Lancets (FREESTYLE) lancets as directed.      Marland Kitchen levothyroxine (SYNTHROID, LEVOTHROID) 25 MCG tablet Take 25 mcg by mouth daily.        Marland Kitchen loratadine (CLARITIN) 10 MG tablet Take 10 mg by mouth as needed.       Marland Kitchen losartan (COZAAR) 25 MG tablet Take 25 mg by mouth at bedtime.        . metoprolol (LOPRESSOR) 100 MG tablet Take 100 mg by mouth 2 (two) times daily.      Marland Kitchen omeprazole (PRILOSEC OTC) 20 MG tablet Take 20 mg by mouth daily.        . potassium  chloride SA (KLOR-CON M20) 20 MEQ tablet Take 1 tablet (20 mEq total) by mouth 2 (two) times daily.  30 tablet  6  . sulfaSALAzine (AZULFIDINE) 500 MG tablet Take 2,000 mg by mouth 2 (two) times daily.       . nitroGLYCERIN (NITROSTAT) 0.4 MG SL tablet Place 1 tablet (0.4 mg total) under the tongue every 5 (five) minutes as needed for chest pain.  90 tablet  3  . [DISCONTINUED] allopurinol (ZYLOPRIM) 100 MG tablet Take 100 mg by mouth daily.         No current facility-administered medications for this visit.    No Active Allergies  Review of Systems negative except from HPI and PMH  Physical Exam BP 144/90  Pulse 79  Ht 5\' 11"  (1.803 m)  Wt 286 lb (129.729 kg)  BMI 39.91 kg/m2 Well developed and well nourished in no acute distress HENT normal E scleral and icterus clear Neck Supple JVP flat; carotids brisk and full Clear to ausculation Irregular rate and rhythm, no murmurs gallops or rub Soft with active bowel sounds No clubbing cyanosis Trace Edema Alert and oriented, grossly normal motor and sensory function Skin Warm and Dry  ECG demonstrates atrial fibrillation rate of 85 Intervals/10/40  with right axis deviation  Ambulatory oxygen saturation testing today in the office demonstrated a saturation of 93%   Assessment and  Plan  HFpEF/HFrEF  Atrial fibrillation  Pacemaker  CAD  She is a mixed cardiomyopathy and congestive heart failure. Heart rate excursion is reasonable on her device. She has less than 10% of her beats faster than 100 beats per minute.I'm not sure that we have any targets for improving dyspnea.   We'll check an echo to see if there is been interval deterioration of LV function. Coronary disease is to be treated medically.   I'm inclined to consider the addition of isosorbide and/or Ranexa

## 2014-03-09 NOTE — Patient Instructions (Signed)
Your physician recommends that you continue on your current medications as directed. Please refer to the Current Medication list given to you today.  Please send a transmission when you first receive your MySmart reader is received.  Remote monitoring is used to monitor your Pacemaker of ICD from home. This monitoring reduces the number of office visits required to check your device to one time per year. It allows [place] to keep an eye on the functioning of your device to ensure it is working properly. You are scheduled for a device check from home on 06/13/14. You may send your transmission at any time that day. If you have a wireless device, the transmission will be sent automatically. After your physician reviews your transmission, you will receive a postcard with your next transmission date.  Your physician wants you to follow-up in: 1 year with Dr. Caryl Comes.  You will receive a reminder letter in the mail two months in advance. If you don't receive a letter, please call our office to schedule the follow-up appointment.

## 2014-03-13 ENCOUNTER — Other Ambulatory Visit: Payer: Self-pay | Admitting: *Deleted

## 2014-03-13 DIAGNOSIS — I4891 Unspecified atrial fibrillation: Secondary | ICD-10-CM

## 2014-03-18 ENCOUNTER — Encounter: Payer: Self-pay | Admitting: Internal Medicine

## 2014-04-08 ENCOUNTER — Telehealth: Payer: Self-pay | Admitting: Cardiology

## 2014-04-08 NOTE — Telephone Encounter (Signed)
Called and LMOVM for pt to send manual transmission from her carelink smart due to upgrades.

## 2014-04-11 ENCOUNTER — Other Ambulatory Visit: Payer: Self-pay | Admitting: *Deleted

## 2014-04-11 DIAGNOSIS — I4891 Unspecified atrial fibrillation: Secondary | ICD-10-CM

## 2014-04-15 ENCOUNTER — Encounter: Payer: Self-pay | Admitting: Cardiology

## 2014-04-25 ENCOUNTER — Ambulatory Visit
Admission: RE | Admit: 2014-04-25 | Discharge: 2014-04-25 | Disposition: A | Discharge status: Home / Self Care | Payer: 59 | Source: Ambulatory Visit | Attending: Family Medicine | Admitting: Family Medicine

## 2014-04-25 ENCOUNTER — Other Ambulatory Visit: Payer: Self-pay | Admitting: Family Medicine

## 2014-04-25 DIAGNOSIS — R05 Cough: Secondary | ICD-10-CM

## 2014-04-25 DIAGNOSIS — R059 Cough, unspecified: Secondary | ICD-10-CM

## 2014-04-28 ENCOUNTER — Other Ambulatory Visit: Payer: Self-pay | Admitting: *Deleted

## 2014-04-28 DIAGNOSIS — I480 Paroxysmal atrial fibrillation: Secondary | ICD-10-CM

## 2014-05-20 ENCOUNTER — Encounter: Payer: Self-pay | Admitting: Cardiology

## 2014-05-20 ENCOUNTER — Telehealth: Payer: Self-pay | Admitting: Cardiology

## 2014-05-20 NOTE — Telephone Encounter (Signed)
LMOVM requesting that pt send 1st transmission with my carelink smart.

## 2014-06-10 ENCOUNTER — Encounter: Payer: Self-pay | Admitting: Internal Medicine

## 2014-06-10 ENCOUNTER — Other Ambulatory Visit: Payer: Self-pay | Admitting: *Deleted

## 2014-06-10 DIAGNOSIS — I4891 Unspecified atrial fibrillation: Secondary | ICD-10-CM

## 2014-06-13 ENCOUNTER — Telehealth: Payer: Self-pay | Admitting: Cardiology

## 2014-06-13 ENCOUNTER — Encounter: Payer: 59 | Admitting: *Deleted

## 2014-06-13 NOTE — Telephone Encounter (Signed)
LMOVM reminding pt to send remote transmission.   

## 2014-06-14 ENCOUNTER — Encounter: Payer: Self-pay | Admitting: Cardiology

## 2014-06-20 ENCOUNTER — Ambulatory Visit (HOSPITAL_COMMUNITY): Payer: Medicare Other | Attending: Cardiology | Admitting: Radiology

## 2014-06-20 DIAGNOSIS — I428 Other cardiomyopathies: Secondary | ICD-10-CM | POA: Diagnosis not present

## 2014-06-20 DIAGNOSIS — I10 Essential (primary) hypertension: Secondary | ICD-10-CM | POA: Diagnosis not present

## 2014-06-20 DIAGNOSIS — I4891 Unspecified atrial fibrillation: Secondary | ICD-10-CM | POA: Insufficient documentation

## 2014-06-20 DIAGNOSIS — I509 Heart failure, unspecified: Secondary | ICD-10-CM | POA: Diagnosis not present

## 2014-06-20 DIAGNOSIS — Z950 Presence of cardiac pacemaker: Secondary | ICD-10-CM

## 2014-06-20 NOTE — Progress Notes (Signed)
Echocardiogram performed.  

## 2014-06-26 ENCOUNTER — Other Ambulatory Visit: Payer: Self-pay | Admitting: Internal Medicine

## 2014-07-04 ENCOUNTER — Encounter: Payer: 59 | Admitting: Internal Medicine

## 2014-07-07 ENCOUNTER — Encounter: Payer: Self-pay | Admitting: Internal Medicine

## 2014-07-07 ENCOUNTER — Ambulatory Visit (INDEPENDENT_AMBULATORY_CARE_PROVIDER_SITE_OTHER): Payer: 59 | Admitting: Internal Medicine

## 2014-07-07 VITALS — BP 132/76 | HR 81 | Ht 72.0 in | Wt 290.6 lb

## 2014-07-07 DIAGNOSIS — I495 Sick sinus syndrome: Secondary | ICD-10-CM

## 2014-07-07 DIAGNOSIS — I482 Chronic atrial fibrillation, unspecified: Secondary | ICD-10-CM

## 2014-07-07 DIAGNOSIS — Z950 Presence of cardiac pacemaker: Secondary | ICD-10-CM

## 2014-07-07 DIAGNOSIS — I25119 Atherosclerotic heart disease of native coronary artery with unspecified angina pectoris: Secondary | ICD-10-CM

## 2014-07-07 LAB — MDC_IDC_ENUM_SESS_TYPE_INCLINIC
Brady Statistic RV Percent Paced: 40 %
Lead Channel Impedance Value: 599 Ohm
Lead Channel Impedance Value: 67 Ohm
Lead Channel Pacing Threshold Amplitude: 0.75 V
Lead Channel Sensing Intrinsic Amplitude: 15.67 mV
Lead Channel Setting Pacing Amplitude: 2.5 V
Lead Channel Setting Pacing Pulse Width: 0.4 ms
Lead Channel Setting Sensing Sensitivity: 5.6 mV
MDC IDC MSMT BATTERY IMPEDANCE: 299 Ohm
MDC IDC MSMT BATTERY REMAINING LONGEVITY: 119 mo
MDC IDC MSMT BATTERY VOLTAGE: 2.8 V
MDC IDC MSMT LEADCHNL RV PACING THRESHOLD PULSEWIDTH: 0.4 ms
MDC IDC SESS DTM: 20151008111512

## 2014-07-07 MED ORDER — RANOLAZINE ER 500 MG PO TB12: 500.0000 mg | ORAL_TABLET | Freq: Two times a day (BID) | ORAL | Status: DC

## 2014-07-07 MED ORDER — NITROGLYCERIN 0.4 MG SL SUBL: 0.4000 mg | SUBLINGUAL_TABLET | SUBLINGUAL | Status: AC | PRN

## 2014-07-07 NOTE — Progress Notes (Signed)
Patient Care Team: Dibas Koirala, MD as PCP - General (Family Medicine) Dibas Koirala, MD (Family Medicine)   HPI  [name] is a 73 y.o. female seen in followup for pacemaker implanted for tachybradycardia syndrome as well as permanent atrial fibrillation with a somewhat rapid ventricular response.    she is status post Pulm Vein isolation at [place] [facility] 2009.  She also has hypertension, obesity as well as sleep apnea   Right and left heart catheterization demonstrating stable stenosis of the proximal LAD and interval worsening of a diagonal branch. She saw Dr. Burt Knack 10/13 to discuss PCI and it was elected because of the complex anatomy to pursue medical therapy.     She has had variable assessment of LV function.  Echo  9/13 EF 30-35%. Global 4 chamber hypokinesis last month 9/15 EF 45-50%   LAD (59/2.3)  She is on anticoagulation so antiplatelet therapy was not initiated   She continues to  Struggle with DOE she takes her diuretics only infrequently. She  denies chest discomfort   In the past efforts to pursue physical therapy were not well rewarded   She also has difficulty with prolonged standing feeling like her "legs are going to buckle underneath her"     Past Medical History  Diagnosis Date  . Nonischemic cardiomyopathy     history of,  EF 20-25% at left heart cath in 06/2007; echo 2011 had normal LV function  . Chronic diastolic heart failure     Echo 06/2010: Moderate LVH, EF 55-65%, normal wall motion, mild MR, moderate to severe LAE, mild RAE.   . Morbid obesity   . Hyperlipidemia   . Tachy-brady syndrome     status post implant of a medtronic dual-chamber pacemaker in 2001.  explanted in 2010 -- [place] pacemaker in 2010.  Marland Kitchen Chronic obstructive pulmonary disease   . Osteoporosis   . Depression   . Obstructive sleep apnea     continuous positive airway pressure  . GERD (gastroesophageal reflux disease)   .  Crohn's disease   . Hypothyroidism     treated  . Seasonal allergies   . Pacemaker     Medtronic  . Atrial fibrillation     Status post pulmonary vein isolation 2009 at [facility]  . CAD (coronary artery disease)     LHC 05/2007: pLAD 70-80%, pRCA 40%, EF 20-25%. LAD lesion treated medically.   . Coronary artery disease 06/15/2012  . Atrial fibrillation 12/18/2007    Annotation: refractory Qualifier: Diagnosis of  By: Doy Mince LPN, Megan    . Morbid obesity with BMI of 40.0-44.9, adult 06/22/2012  . Diabetes mellitus 06/22/2012    Past Surgical History  Procedure Laterality Date  . Pulmonary vein isolation  05/12/2008    RFCA atrial fibrillation  . Post ablation pseudoaneurysm      at A fib ablation  . Cardiac catheterization  08/2000    noncritical disease mid RCA, EF preserved  . Cardiac catheterization  05/29/2007    noncritical disease, EF 20-25%  . Pacemaker insertion  2010    medtronic ADAPTA     Current Outpatient Prescriptions  Medication Sig Dispense Refill  . apixaban (ELIQUIS) 5 MG TABS tablet Take 1 tablet (5 mg total) by mouth 2 (two) times daily.  180 tablet  3  . atorvastatin (LIPITOR) 80 MG tablet Take 80 mg by mouth at bedtime.        . B-D INS SYRINGE  0.5CC/31GX5/16 31G X 5/16" 0.5 ML MISC as directed.      Marland Kitchen BONIVA 150 MG tablet Take 1 tablet by mouth Every month.      . Fiber CAPS Take by mouth daily. 5/6 caps daily      . Fluticasone-Salmeterol (ADVAIR DISKUS) 500-50 MCG/DOSE AEPB Inhale 1 puff into the lungs every 12 (twelve) hours.        Marland Kitchen FREESTYLE LITE test strip as directed.      . furosemide (LASIX) 40 MG tablet TAKE 1 TABLET BY MOUTH EVERY MORNING AND TAKE 2 TABLETS EVERY EVENING  270 tablet  0  . insulin glargine (LANTUS) 100 UNIT/ML injection Inject 30 Units into the skin every morning.       . Lancets (FREESTYLE) lancets as directed.      Marland Kitchen levothyroxine (SYNTHROID, LEVOTHROID) 25 MCG tablet Take 25 mcg by mouth daily.        Marland Kitchen loratadine  (CLARITIN) 10 MG tablet Take 10 mg by mouth as needed.       Marland Kitchen losartan (COZAAR) 25 MG tablet Take 25 mg by mouth at bedtime.        . metoprolol (LOPRESSOR) 100 MG tablet Take 1 tablet (100 mg total) by mouth 2 (two) times daily.  180 tablet  1  . omeprazole (PRILOSEC OTC) 20 MG tablet Take 20 mg by mouth daily.        . potassium chloride SA (KLOR-CON M20) 20 MEQ tablet Take 1 tablet (20 mEq total) by mouth 2 (two) times daily.  180 tablet  3  . sulfaSALAzine (AZULFIDINE) 500 MG tablet Take 2,000 mg by mouth 2 (two) times daily.       . nitroGLYCERIN (NITROSTAT) 0.4 MG SL tablet Place 1 tablet (0.4 mg total) under the tongue every 5 (five) minutes as needed for chest pain.  90 tablet  3  . [DISCONTINUED] allopurinol (ZYLOPRIM) 100 MG tablet Take 100 mg by mouth daily.         No current facility-administered medications for this visit.    No Known Allergies  Review of Systems negative except from HPI and PMH  Physical Exam BP 132/76  Pulse 81  Ht 6' (1.829 m)  Wt 290 lb 9.6 oz (131.815 kg)  BMI 39.40 kg/m2 Well developed and well nourished in no acute distress HENT normal E scleral and icterus clear Neck Supple JVP flat; carotids brisk and full Clear to ausculation Irregular rate and rhythm, no murmurs gallops or rub Soft with active bowel sounds No clubbing cyanosis Trace Edema Alert and oriented, grossly normal motor and sensory function Skin Warm and Dry  ECG demonstrates atrial fibrillation rate of 85 Intervals/10/40  with right axis deviation  Ambulatory oxygen saturation testing today in the office demonstrated a saturation of 93%   Assessment and  Plan  HFpEF/HFrEF  Atrial fibrillation  Pacemaker  Dizziness with standing   CAD  Her exercise intolerance continues to worsen. I wonder whether there is a component of a milliequivalents manifesting as dyspnea. We will try empiric ranolazine.   We've also discussed the value of diuretics which she is prone to  not take because of convenience.   In addition,  I've encouraged her to pursue physical therapy again.    We spent more than 50% of our >40 min visit in face to face counseling regarding the above

## 2014-07-07 NOTE — Patient Instructions (Addendum)
Your physician has recommended you make the following change in your medication:  1) START Ranolazine 500 mg twice a day  Your physician recommends that you schedule a follow-up appointment in: 8-12 weeks with Dr. Caryl Comes.

## 2014-08-14 ENCOUNTER — Inpatient Hospital Stay (HOSPITAL_COMMUNITY)
Admission: EM | Admit: 2014-08-14 | Discharge: 2014-08-18 | DRG: 253 | Disposition: A | Discharge status: Home / Self Care | Payer: Medicare Other | Attending: Surgery | Admitting: Surgery

## 2014-08-14 ENCOUNTER — Emergency Department (HOSPITAL_COMMUNITY): Payer: Medicare Other

## 2014-08-14 ENCOUNTER — Encounter (HOSPITAL_COMMUNITY): Payer: Self-pay | Admitting: Emergency Medicine

## 2014-08-14 DIAGNOSIS — E119 Type 2 diabetes mellitus without complications: Secondary | ICD-10-CM | POA: Diagnosis present

## 2014-08-14 DIAGNOSIS — Z7901 Long term (current) use of anticoagulants: Secondary | ICD-10-CM

## 2014-08-14 DIAGNOSIS — J449 Chronic obstructive pulmonary disease, unspecified: Secondary | ICD-10-CM | POA: Diagnosis present

## 2014-08-14 DIAGNOSIS — Z01810 Encounter for preprocedural cardiovascular examination: Secondary | ICD-10-CM

## 2014-08-14 DIAGNOSIS — Z6841 Body Mass Index (BMI) 40.0 and over, adult: Secondary | ICD-10-CM

## 2014-08-14 DIAGNOSIS — Z794 Long term (current) use of insulin: Secondary | ICD-10-CM

## 2014-08-14 DIAGNOSIS — Z950 Presence of cardiac pacemaker: Secondary | ICD-10-CM

## 2014-08-14 DIAGNOSIS — M25521 Pain in right elbow: Secondary | ICD-10-CM | POA: Diagnosis not present

## 2014-08-14 DIAGNOSIS — I482 Chronic atrial fibrillation: Secondary | ICD-10-CM | POA: Diagnosis present

## 2014-08-14 DIAGNOSIS — R0989 Other specified symptoms and signs involving the circulatory and respiratory systems: Secondary | ICD-10-CM

## 2014-08-14 DIAGNOSIS — M109 Gout, unspecified: Secondary | ICD-10-CM | POA: Diagnosis present

## 2014-08-14 DIAGNOSIS — I742 Embolism and thrombosis of arteries of the upper extremities: Secondary | ICD-10-CM | POA: Diagnosis not present

## 2014-08-14 DIAGNOSIS — I998 Other disorder of circulatory system: Secondary | ICD-10-CM | POA: Diagnosis present

## 2014-08-14 DIAGNOSIS — Z87891 Personal history of nicotine dependence: Secondary | ICD-10-CM

## 2014-08-14 HISTORY — DX: Gout, unspecified: M10.9

## 2014-08-14 HISTORY — DX: Type 2 diabetes mellitus without complications: E11.9

## 2014-08-14 HISTORY — DX: Chronic obstructive pulmonary disease, unspecified: J44.9

## 2014-08-14 LAB — CBC
HCT: 49.1 % — ABNORMAL HIGH (ref 36.0–46.0)
Hemoglobin: 16.8 g/dL — ABNORMAL HIGH (ref 12.0–15.0)
MCH: 33.6 pg (ref 26.0–34.0)
MCHC: 34.2 g/dL (ref 30.0–36.0)
MCV: 98.2 fL (ref 78.0–100.0)
Platelets: 285 10*3/uL (ref 150–400)
RBC: 5 MIL/uL (ref 3.87–5.11)
RDW: 13.6 % (ref 11.5–15.5)
WBC: 9.7 10*3/uL (ref 4.0–10.5)

## 2014-08-14 LAB — PROTIME-INR
INR: 1.07 (ref 0.00–1.49)
PROTHROMBIN TIME: 14 s (ref 11.6–15.2)

## 2014-08-14 LAB — BASIC METABOLIC PANEL
Anion gap: 12 (ref 5–15)
BUN: 21 mg/dL (ref 6–23)
CHLORIDE: 103 meq/L (ref 96–112)
CO2: 25 mEq/L (ref 19–32)
Calcium: 11.3 mg/dL — ABNORMAL HIGH (ref 8.4–10.5)
Creatinine, Ser: 1.12 mg/dL — ABNORMAL HIGH (ref 0.50–1.10)
GFR calc Af Amer: 55 mL/min — ABNORMAL LOW (ref >90)
GFR calc non Af Amer: 47 mL/min — ABNORMAL LOW (ref >90)
GLUCOSE: 188 mg/dL — AB (ref 70–99)
POTASSIUM: 4.6 meq/L (ref 3.7–5.3)
Sodium: 140 mEq/L (ref 137–147)

## 2014-08-14 LAB — I-STAT TROPONIN, ED: TROPONIN I, POC: 0.07 ng/mL (ref 0.00–0.08)

## 2014-08-14 LAB — APTT: APTT: 34 s (ref 24–37)

## 2014-08-14 MED ORDER — ONDANSETRON HCL 4 MG/2ML IJ SOLN
4.0000 mg | Freq: Once | INTRAMUSCULAR | Status: AC
  Administered 2014-08-14: 4 mg via INTRAVENOUS
  Filled 2014-08-14: qty 2

## 2014-08-14 MED ORDER — MORPHINE SULFATE 4 MG/ML IJ SOLN
4.0000 mg | Freq: Once | INTRAMUSCULAR | Status: AC
  Administered 2014-08-14: 4 mg via INTRAVENOUS
  Filled 2014-08-14: qty 1

## 2014-08-14 MED ORDER — HEPARIN (PORCINE) IN NACL 100-0.45 UNIT/ML-% IJ SOLN
1200.0000 [IU]/h | INTRAMUSCULAR | Status: DC
  Administered 2014-08-14: 1600 [IU]/h via INTRAVENOUS
  Administered 2014-08-15: 1350 [IU]/h via INTRAVENOUS
  Filled 2014-08-14 (×4): qty 250

## 2014-08-14 NOTE — ED Provider Notes (Signed)
CSN: [id number]     Arrival date & time 08/14/14  1914 History   First MD Initiated Contact with Patient 08/14/14 1932     No chief complaint on file.    (Consider location/radiation/quality/duration/timing/severity/associated sxs/prior Treatment) HPI   This is a 73 year old female who presents to the emergency family chief complaint of severe right arm pain. She has a past medical history of atrial fibrillation, diabetes, COPD and gout. The patient recently fell at home and has a black eye. She stopped her pelvic rest for a few days after this occurred. The patient resumed her alcohol was about 2 days ago. Today while she was at dinner with friends she had sudden onset of right arm pain in the upper part of the arm. She states that it became progressively more intense. Her friends urged her to go to the emergency department. The patient drove herself over and states that while she is driving she became diaphoretic and had severe right arm pain. It seems to be decreasing somewhat at this point however it is still moderate to severe. She denies any chest pain, shortness of breath, nausea or vomiting. Risk factors for acute coronary syndrome include obesity, former smoker, hyperlipidemia, hypertension and diabetes. Denies fevers, chills, myalgias. Denies dysuria, flank pain, suprapubic pain, frequency, urgency, or hematuria. Denies headaches, light headedness, weakness, visual disturbances. Denies abdominal pain, nausea, vomiting, diarrhea or constipation.    Past Medical History  Diagnosis Date  . Atrial fibrillation   . Diabetes mellitus without complication   . COPD (chronic obstructive pulmonary disease)   . Gout    Past Surgical History  Procedure Laterality Date  . Tonsillectomy     No family history on file. History  Substance Use Topics  . Smoking status: Former Research scientist (life sciences)  . Smokeless tobacco: Former Systems developer    Quit date: 08/15/1987  . Alcohol Use: No   OB History    No data  available     Review of Systems  Ten systems reviewed and are negative for acute change, except as noted in the HPI.    Allergies  Review of patient's allergies indicates no known allergies.  Home Medications   Prior to Admission medications   Not on File   BP 134/85 mmHg  Pulse 81  Temp(Src) 97.5 F (36.4 C) (Oral)  Resp 24  SpO2 97% Physical Exam  Constitutional: She is oriented to person, place, and time. She appears well-developed and well-nourished. No distress.  HENT:  Head: Normocephalic.  Mouth/Throat: Oropharynx is clear and moist.  Large area of ecchymosis in varying degrees of healing stage over the right eye.  Eyes: Conjunctivae and EOM are normal. Pupils are equal, round, and reactive to light. No scleral icterus.  No horizontal, vertical or rotational nystagmus  Neck: Normal range of motion. Neck supple.  Full active and passive ROM without pain No midline or paraspinal tenderness No nuchal rigidity or meningeal signs  Cardiovascular: Normal rate, regular rhythm and intact distal pulses.   No peripheral or pitting edema. Right arm without palpable radial pulse. Brachial pulse is palpable but thready. Hand is cool to the touch with delayed capillary refill. Pulse is audible on Doppler.  Pulmonary/Chest: Effort normal and breath sounds normal. No respiratory distress. She has no wheezes. She has no rales.  Abdominal: Soft. Bowel sounds are normal. There is no tenderness. There is no rebound and no guarding.  Musculoskeletal: Normal range of motion.  Lymphadenopathy:    She has no cervical adenopathy.  Neurological:  She is alert and oriented to person, place, and time. She has normal reflexes. No cranial nerve deficit. She exhibits normal muscle tone. Coordination normal.  Mental Status:  Alert, oriented, thought content appropriate. Speech fluent without evidence of aphasia. Able to follow 2 step commands without difficulty.  Cranial Nerves:  II:  Peripheral  visual fields grossly normal, pupils equal, round, reactive to light III,IV, VI: ptosis not present, extra-ocular motions intact bilaterally  V,VII: smile symmetric, facial light touch sensation equal VIII: hearing grossly normal bilaterally  IX,X: gag reflex present  XI: bilateral shoulder shrug equal and strong XII: midline tongue extension  Motor:  5/5 in upper and lower extremities bilaterally including strong and equal grip strength and dorsiflexion/plantar flexion Sensory: Pinprick and light touch normal in all extremities.  Deep Tendon Reflexes: 2+ and symmetric  Cerebellar: normal finger-to-nose with bilateral upper extremities Gait: normal gait and balance CV: distal pulses palpable throughout   Skin: Skin is warm and dry. No rash noted. She is not diaphoretic.  Petechiae noted bilaterally on the upper extremities  Psychiatric: She has a normal mood and affect. Her behavior is normal. Judgment and thought content normal.  Nursing note and vitals reviewed.   ED Course  Procedures (including critical care time) Labs Review Labs Reviewed  CBC - Abnormal; Notable for the following:    Hemoglobin 16.8 (*)    HCT 49.1 (*)    All other components within normal limits  BASIC METABOLIC PANEL  I-STAT TROPOININ, ED    Imaging Review No results found.   EKG Interpretation None      MDM   Final diagnoses:  None    7:54 PM BP 134/85 mmHg  Pulse 81  Temp(Src) 97.5 F (36.4 C) (Oral)  Resp 24  SpO2 97% Patient seen and she had a visit with Dr. Doy Mince. Of concern for a possible embolus to the right arm considering the fact that she recently discontinued her adequate for several days after her fall and has chronic atrial fibrillation.  9:00 PM Patient begun on heprarin and the Request of Dr. Oneida Alar of vascular surgery. He aslo requests that she be transferred to [facility] for further management. Patient seen in shared visit with attending physician. Dr. Doy Mince has spoken  with Dr. Winfred Leeds who has accepted the patient in transfer. I informed the pateint of the plan who is aware and agrees.    Margarita Mail, PA-C 08/15/14 2057  Artis Delay, MD 08/16/14 [id number]

## 2014-08-14 NOTE — ED Provider Notes (Signed)
Medical screening examination/treatment/procedure(s) were conducted as a shared visit with non-physician practitioner(s) and myself.  I personally evaluated the patient during the encounter.   EKG Interpretation   Date/Time:  Sunday August 14 2014 19:14:55 EST Ventricular Rate:  86 PR Interval:    QRS Duration: 165 QT Interval:  454 QTC Calculation: 543 R Axis:   -129 Text Interpretation:  Afib/flutter and ventricular-paced rhythm No further  analysis attempted due to paced rhythm Baseline wander in lead(s) V3 No  old tracing to compare Confirmed by Gwendolen Hewlett  MD, TREY ([id number]) on 08/14/2014  8:28:45 PM      73  year old female with a history of A. Fib who takes Eliquis presenting with sudden onset severe right arm pain.  On exam, well appearing, nontoxic, not distressed, normal respiratory effort, normal perfusion, 2+ radial pulse in left upper extremity, unable to palpate radial pulse in the right upper extremity, right hand is slightly cooler than left hand, right hand has delayed cap refill compared to left hand.  Case discussed with Dr. Oneida Alar (Vascular Surgery) by PA Harris.  She will be started on heparin and transferred to [facility] for vascular evaluation.    Clinical Impression: Right arm pain   Artis Delay, MD 08/14/14 2031

## 2014-08-14 NOTE — Progress Notes (Signed)
ANTICOAGULATION CONSULT NOTE - Initial Consult  Pharmacy Consult for Heparin Indication: Rule out RUE DVT, chronic afib  No Known Allergies  Patient Measurements: Height: 5\' 10"  (177.8 cm) Weight: 292 lb (132.45 kg) IBW/kg (Calculated) : 68.5 Heparin Dosing Weight: 99 kg  Vital Signs: Temp: 97.5 F (36.4 C) (11/15 1915) Temp Source: Oral (11/15 1915) BP: 134/85 mmHg (11/15 1915) Pulse Rate: 81 (11/15 1915)  Labs:  Recent Labs  08/14/14 1927  HGB 16.8*  HCT 49.1*  PLT 285  CREATININE 1.12*    Estimated Creatinine Clearance: 65.5 mL/min (by C-G formula based on Cr of 1.12).   Medical History: Past Medical History  Diagnosis Date  . Atrial fibrillation   . Diabetes mellitus without complication   . COPD (chronic obstructive pulmonary disease)   . Gout     Medications:  Scheduled:  Infusions:  PRN:     Assessment: 73 year old female with a history of A. Fib who takes Eliquis presenting with sudden onset severe right arm pain.Patient stopped taking Eliquis recently after falling at home. She resumed taking it ~ 2 days ago. Pharmacy asked to dose IV heparin due to concern for right arm embolus. Plan is for Vascular Surgery consult and transfer to [facility].  Most recent dose of Eliquis was taken today at 16:00, therefore, will NOT give heparin bolus.  Note that baseline INR may be elevated given recent Eliquis use. Anti Xa level (heparin level) may also be elevated, therefore, will check PTTs to evaluate level of anticoagulation while on heparin until full effects of Eliquis are past.  Goal of Therapy:  Heparin level 0.3-0.7 units/ml Monitor platelets by anticoagulation protocol: Yes   Plan:   No heparin bolus  Heparin 1600 units/hr IV infusion (~16 units/kg/hr based on adjusted body weight)  Check heparin level and PTT in 8 hours  Daily heparin level, PTT and CBC  Peggyann Juba, PharmD, BCPS Pager: [phone] 08/14/2014,8:48 PM

## 2014-08-14 NOTE — ED Notes (Signed)
[facility] notified of need for transport to [facility] ED/Kellee RN called report to charge RN

## 2014-08-14 NOTE — ED Notes (Signed)
Bed: OM76 Expected date:  Expected time:  Means of arrival:  Comments: Chest pain

## 2014-08-15 ENCOUNTER — Inpatient Hospital Stay (HOSPITAL_COMMUNITY): Payer: Medicare Other

## 2014-08-15 ENCOUNTER — Encounter (HOSPITAL_COMMUNITY): Payer: Self-pay | Admitting: *Deleted

## 2014-08-15 DIAGNOSIS — I998 Other disorder of circulatory system: Secondary | ICD-10-CM

## 2014-08-15 DIAGNOSIS — M109 Gout, unspecified: Secondary | ICD-10-CM | POA: Diagnosis present

## 2014-08-15 DIAGNOSIS — Z950 Presence of cardiac pacemaker: Secondary | ICD-10-CM | POA: Diagnosis not present

## 2014-08-15 DIAGNOSIS — I482 Chronic atrial fibrillation: Secondary | ICD-10-CM | POA: Diagnosis present

## 2014-08-15 DIAGNOSIS — Z87891 Personal history of nicotine dependence: Secondary | ICD-10-CM | POA: Diagnosis not present

## 2014-08-15 DIAGNOSIS — I742 Embolism and thrombosis of arteries of the upper extremities: Secondary | ICD-10-CM | POA: Diagnosis present

## 2014-08-15 DIAGNOSIS — J449 Chronic obstructive pulmonary disease, unspecified: Secondary | ICD-10-CM | POA: Diagnosis present

## 2014-08-15 DIAGNOSIS — E119 Type 2 diabetes mellitus without complications: Secondary | ICD-10-CM | POA: Diagnosis present

## 2014-08-15 DIAGNOSIS — Z794 Long term (current) use of insulin: Secondary | ICD-10-CM | POA: Diagnosis not present

## 2014-08-15 DIAGNOSIS — Z01810 Encounter for preprocedural cardiovascular examination: Secondary | ICD-10-CM

## 2014-08-15 DIAGNOSIS — Z6841 Body Mass Index (BMI) 40.0 and over, adult: Secondary | ICD-10-CM | POA: Diagnosis not present

## 2014-08-15 DIAGNOSIS — Z7901 Long term (current) use of anticoagulants: Secondary | ICD-10-CM | POA: Diagnosis not present

## 2014-08-15 DIAGNOSIS — M25521 Pain in right elbow: Secondary | ICD-10-CM | POA: Diagnosis present

## 2014-08-15 DIAGNOSIS — I999 Unspecified disorder of circulatory system: Secondary | ICD-10-CM

## 2014-08-15 LAB — CBC
HCT: 47.1 % — ABNORMAL HIGH (ref 36.0–46.0)
Hemoglobin: 15.4 g/dL — ABNORMAL HIGH (ref 12.0–15.0)
MCH: 33.2 pg (ref 26.0–34.0)
MCHC: 32.7 g/dL (ref 30.0–36.0)
MCV: 101.5 fL — AB (ref 78.0–100.0)
PLATELETS: 228 10*3/uL (ref 150–400)
RBC: 4.64 MIL/uL (ref 3.87–5.11)
RDW: 13.9 % (ref 11.5–15.5)
WBC: 7.4 10*3/uL (ref 4.0–10.5)

## 2014-08-15 LAB — APTT
APTT: 133 s — AB (ref 24–37)
aPTT: 133 seconds — ABNORMAL HIGH (ref 24–37)

## 2014-08-15 LAB — URINALYSIS, ROUTINE W REFLEX MICROSCOPIC
Bilirubin Urine: NEGATIVE
GLUCOSE, UA: NEGATIVE mg/dL
HGB URINE DIPSTICK: NEGATIVE
Ketones, ur: NEGATIVE mg/dL
Nitrite: NEGATIVE
Protein, ur: 100 mg/dL — AB
SPECIFIC GRAVITY, URINE: 1.018 (ref 1.005–1.030)
Urobilinogen, UA: 0.2 mg/dL (ref 0.0–1.0)
pH: 5 (ref 5.0–8.0)

## 2014-08-15 LAB — URINE MICROSCOPIC-ADD ON

## 2014-08-15 LAB — BASIC METABOLIC PANEL
ANION GAP: 11 (ref 5–15)
BUN: 20 mg/dL (ref 6–23)
CALCIUM: 10.5 mg/dL (ref 8.4–10.5)
CO2: 26 meq/L (ref 19–32)
CREATININE: 0.97 mg/dL (ref 0.50–1.10)
Chloride: 104 mEq/L (ref 96–112)
GFR, EST AFRICAN AMERICAN: 65 mL/min — AB (ref >90)
GFR, EST NON AFRICAN AMERICAN: 56 mL/min — AB (ref >90)
Glucose, Bld: 109 mg/dL — ABNORMAL HIGH (ref 70–99)
Potassium: 4.7 mEq/L (ref 3.7–5.3)
Sodium: 141 mEq/L (ref 137–147)

## 2014-08-15 LAB — GLUCOSE, CAPILLARY
GLUCOSE-CAPILLARY: 99 mg/dL (ref 70–99)
GLUCOSE-CAPILLARY: 107 mg/dL — AB (ref 70–99)
Glucose-Capillary: 113 mg/dL — ABNORMAL HIGH (ref 70–99)
Glucose-Capillary: 108 mg/dL — ABNORMAL HIGH (ref 70–99)

## 2014-08-15 LAB — HEPARIN LEVEL (UNFRACTIONATED)
HEPARIN UNFRACTIONATED: 1 [IU]/mL — AB (ref 0.30–0.70)
Heparin Unfractionated: 1.02 IU/mL — ABNORMAL HIGH (ref 0.30–0.70)

## 2014-08-15 LAB — CBG MONITORING, ED: Glucose-Capillary: 94 mg/dL (ref 70–99)

## 2014-08-15 LAB — HEMOGLOBIN A1C
HEMOGLOBIN A1C: 6.2 % — AB (ref <5.7)
MEAN PLASMA GLUCOSE: 131 mg/dL — AB (ref <117)

## 2014-08-15 MED ORDER — CEFUROXIME SODIUM 1.5 G IJ SOLR
1.5000 g | INTRAMUSCULAR | Status: AC
  Administered 2014-08-16: 1.5 g via INTRAVENOUS
  Filled 2014-08-15: qty 1.5

## 2014-08-15 MED ORDER — POTASSIUM CHLORIDE CRYS ER 20 MEQ PO TBCR
20.0000 meq | EXTENDED_RELEASE_TABLET | Freq: Once | ORAL | Status: DC
Start: 2014-08-15 — End: 2014-08-15

## 2014-08-15 MED ORDER — PANTOPRAZOLE SODIUM 40 MG PO TBEC: 40.0000 mg | DELAYED_RELEASE_TABLET | Freq: Every day | ORAL | Status: DC

## 2014-08-15 MED ORDER — ALUM & MAG HYDROXIDE-SIMETH 200-200-20 MG/5ML PO SUSP: 15.0000 mL | ORAL | Status: DC | PRN

## 2014-08-15 MED ORDER — PANTOPRAZOLE SODIUM 40 MG PO TBEC
40.0000 mg | DELAYED_RELEASE_TABLET | Freq: Every day | ORAL | Status: DC
  Administered 2014-08-15 – 2014-08-18 (×3): 40 mg via ORAL
  Filled 2014-08-15 (×2): qty 1

## 2014-08-15 MED ORDER — GUAIFENESIN-DM 100-10 MG/5ML PO SYRP: 15.0000 mL | ORAL_SOLUTION | ORAL | Status: DC | PRN

## 2014-08-15 MED ORDER — ONDANSETRON HCL 4 MG/2ML IJ SOLN: 4.0000 mg | Freq: Four times a day (QID) | INTRAMUSCULAR | Status: DC | PRN

## 2014-08-15 MED ORDER — DEXTROSE 5 % IV SOLN: 1.5000 g | INTRAVENOUS | Status: DC

## 2014-08-15 MED ORDER — METOPROLOL TARTRATE 25 MG PO TABS
25.0000 mg | ORAL_TABLET | Freq: Two times a day (BID) | ORAL | Status: DC
  Administered 2014-08-15 – 2014-08-18 (×6): 25 mg via ORAL
  Filled 2014-08-15 (×8): qty 1

## 2014-08-15 MED ORDER — METOPROLOL TARTRATE 1 MG/ML IV SOLN: 2.0000 mg | INTRAVENOUS | Status: DC | PRN

## 2014-08-15 MED ORDER — ACETAMINOPHEN 650 MG RE SUPP: 325.0000 mg | RECTAL | Status: DC | PRN

## 2014-08-15 MED ORDER — LABETALOL HCL 5 MG/ML IV SOLN
10.0000 mg | INTRAVENOUS | Status: DC | PRN
  Filled 2014-08-15: qty 4

## 2014-08-15 MED ORDER — SULFASALAZINE 500 MG PO TABS
500.0000 mg | ORAL_TABLET | Freq: Every day | ORAL | Status: DC
  Administered 2014-08-15 – 2014-08-18 (×4): 500 mg via ORAL
  Filled 2014-08-15 (×6): qty 1

## 2014-08-15 MED ORDER — ACETAMINOPHEN 325 MG PO TABS
325.0000 mg | ORAL_TABLET | ORAL | Status: DC | PRN
  Administered 2014-08-16: 650 mg via ORAL
  Filled 2014-08-15: qty 2

## 2014-08-15 MED ORDER — INSULIN GLARGINE 100 UNIT/ML ~~LOC~~ SOLN
30.0000 [IU] | Freq: Every morning | SUBCUTANEOUS | Status: DC
  Administered 2014-08-15 – 2014-08-18 (×3): 30 [IU] via SUBCUTANEOUS
  Filled 2014-08-15 (×4): qty 0.3

## 2014-08-15 MED ORDER — PHENOL 1.4 % MT LIQD: 1.0000 | OROMUCOSAL | Status: DC | PRN

## 2014-08-15 MED ORDER — DOCUSATE SODIUM 100 MG PO CAPS
100.0000 mg | ORAL_CAPSULE | Freq: Two times a day (BID) | ORAL | Status: DC
  Administered 2014-08-15 – 2014-08-18 (×6): 100 mg via ORAL
  Filled 2014-08-15 (×10): qty 1

## 2014-08-15 MED ORDER — ADULT MULTIVITAMIN W/MINERALS CH
1.0000 | ORAL_TABLET | Freq: Every day | ORAL | Status: DC
  Administered 2014-08-15 – 2014-08-18 (×3): 1 via ORAL
  Filled 2014-08-15 (×4): qty 1

## 2014-08-15 MED ORDER — HYDRALAZINE HCL 20 MG/ML IJ SOLN: 5.0000 mg | INTRAMUSCULAR | Status: DC | PRN

## 2014-08-15 MED ORDER — INSULIN ASPART 100 UNIT/ML ~~LOC~~ SOLN
0.0000 [IU] | Freq: Three times a day (TID) | SUBCUTANEOUS | Status: DC
  Administered 2014-08-17: 2 [IU] via SUBCUTANEOUS

## 2014-08-15 MED ORDER — POTASSIUM CHLORIDE CRYS ER 20 MEQ PO TBCR: 20.0000 meq | EXTENDED_RELEASE_TABLET | Freq: Once | ORAL | Status: DC

## 2014-08-15 MED ORDER — SULFASALAZINE 500 MG PO TBEC
500.0000 mg | DELAYED_RELEASE_TABLET | Freq: Every day | ORAL | Status: DC
  Filled 2014-08-15: qty 1

## 2014-08-15 NOTE — ED Notes (Signed)
Dr.Jacubowitz and Dr.Fields at bedside

## 2014-08-15 NOTE — Progress Notes (Signed)
ANTICOAGULATION CONSULT NOTE - Follow Up Consult  Pharmacy Consult for Heparin Indication: atrial fibrillation and right brachial embolus  No Known Allergies  Patient Measurements: Height: 5\' 10"  (177.8 cm) Weight: 289 lb 7.4 oz (131.3 kg) IBW/kg (Calculated) : 68.5 Heparin Dosing Weight: 99 kg  Vital Signs: Temp: 97.9 F (36.6 C) (11/16 1352) Temp Source: Oral (11/16 1352) BP: 97/52 mmHg (11/16 1352) Pulse Rate: 70 (11/16 1352)  Labs:  Recent Labs  08/14/14 1927 08/14/14 2040 08/15/14 0122 08/15/14 0635 08/15/14 1350  HGB 16.8*  --   --  15.4*  --   HCT 49.1*  --   --  47.1*  --   PLT 285  --   --  228  --   APTT  --  34  --  133* 133*  LABPROT  --  14.0  --   --   --   INR  --  1.07  --   --   --   HEPARINUNFRC  --   --  1.02* 1.00*  --   CREATININE 1.12*  --   --  0.97  --     Estimated Creatinine Clearance: 75.2 mL/min (by C-G formula based on Cr of 0.97).   Medications:  Scheduled:  . [START ON 08/16/2014] cefUROXime (ZINACEF)  IV  1.5 g Intravenous On Call to OR  . docusate sodium  100 mg Oral BID  . insulin aspart  0-15 Units Subcutaneous TID WC  . insulin glargine  30 Units Subcutaneous q morning - 10a  . metoprolol tartrate  25 mg Oral BID  . multivitamin with minerals  1 tablet Oral Daily  . pantoprazole  40 mg Oral Daily  . sulfaSALAzine  500 mg Oral Q breakfast   Infusions:  . heparin 1,350 Units/hr (08/15/14 1337)    Assessment: 73 yo F continues on IV heparin for R brachial embolus with plans for embolectomy 11/17.  Pt was on Apixaban PTA which can influence anti-Xa levels and thus heparin dosing is guided by aPTT.  aPTT is unchanged despite rate decrease this AM.  Will further decrease rate and repeat level this PM.  Goal of Therapy:  Heparin level 0.3-0.7 units/ml aPTT 66-102 seconds Monitor platelets by anticoagulation protocol: Yes   Plan:  Decrease heparin to 1200 units/hr Check aPTT at 2200 tonight Continue daily heparin level,  CBC, and aPTT Follow up heparin stop time prior to embolectomy 11/17.  [employer], Pharm.D., BCPS Clinical Pharmacist Pager [phone] 08/15/2014 3:31 PM

## 2014-08-15 NOTE — H&P (Addendum)
[facility] [place] HISTORY AND PHYSICAL   History of Present Illness:  Patient is a 73 y.o. year old female who presents for evaluation of right hand ischemia. Pt had acute onset of pain near her right elbow followed by numbness and tingling in her right hand earlier this evening.   She is usually on Eliquis for atrial fibrillation but stopped after a recent fall where she developed a black eye.  Pt fell and sustained a bruise around her right eye 4 days ago. She then resumed her Eliquis earlier today but is a little unsure whether she took it Sunday morning and evening or one or the other.  She has had afib for a long time but was switched from coumadin to Eliquis recently after she "saw the TV ad".  The numbness and tingling in her hand has now resolved and her hand feels normal at this point.  She was started on heparin at approximately 9 pm.   I was called about the patient from [facility] at 830 pm and requested transfer to [facility].  She arrived in transfer at 1145pm.  Other medical problems include diabetes and COPD which are controlled.  She also has a left side pacemaker.  Her cardiologist is Dr Caryl Comes.  She denies any shortness of breath or chest pain today.  Her troponin was normal.  Past Medical History  Diagnosis Date  . Atrial fibrillation   . Diabetes mellitus without complication   . COPD (chronic obstructive pulmonary disease)   . Gout     Past Surgical History  Procedure Laterality Date  . Tonsillectomy      Social History History  Substance Use Topics  . Smoking status: Former Research scientist (life sciences)  . Smokeless tobacco: Former Systems developer    Quit date: 08/15/1987  . Alcohol Use: No    Family History No family history on file.  Allergies  No Known Allergies   Current Facility-Administered Medications  Medication Dose Route Frequency Provider Last Rate Last Dose  . heparin ADULT infusion 100 units/mL (25000 units/250 mL)  1,600 Units/hr Intravenous  Continuous Emiliano Dyer, RPH 16 mL/hr at 08/14/14 2151 1,600 Units/hr at 08/14/14 2151   Current Outpatient Prescriptions  Medication Sig Dispense Refill  . Apixaban (ELIQUIS PO) Take 1 tablet by mouth daily.    . insulin glargine (LANTUS) 100 UNIT/ML injection Inject 30 Units into the skin every morning.    Marland Kitchen METOPROLOL TARTRATE PO Take 1 tablet by mouth 2 (two) times daily.    . Multiple Vitamins-Minerals (MULTIVITAMIN WITH MINERALS) tablet Take 1 tablet by mouth daily.    . Omeprazole Magnesium (PRILOSEC OTC PO) Take 1 tablet by mouth every morning.    . SULFASALAZINE PO Take 1 tablet by mouth.      ROS:   General:  No weight loss, Fever, chills  HEENT: No recent headaches, no nasal bleeding, no visual changes, no sore throat  Neurologic: No dizziness, blackouts, seizures. No recent symptoms of stroke or mini- stroke. No recent episodes of slurred speech, or temporary blindness.  Cardiac: No recent episodes of chest pain/pressure, no shortness of breath at rest.  + shortness of breath with exertion.  + history of atrial fibrillation or irregular heartbeat  Vascular: No history of rest pain in feet.  No history of claudication.  No history of non-healing ulcer, No history of DVT   Pulmonary: No home oxygen, no productive cough, no hemoptysis,  No asthma or wheezing  Musculoskeletal:  [ ]   Arthritis, [ ]  Low back pain,  [ ]  Joint pain  Hematologic:No history of hypercoagulable state.  No history of easy bleeding.  No history of anemia  Gastrointestinal: No hematochezia or melena,  No gastroesophageal reflux, no trouble swallowing  Urinary: [ ]  chronic Kidney disease, [ ]  on HD - [ ]  MWF or [ ]  TTHS, [ ]  Burning with urination, [ ]  Frequent urination, [ ]  Difficulty urinating;   Skin: No rashes  Psychological: No history of anxiety,  No history of depression   Physical Examination  Filed Vitals:   08/14/14 2045 08/14/14 2100 08/14/14 2342 08/14/14 2345  BP:  137/96   145/100  Pulse:  65  71  Temp:      TempSrc:      Resp:  22  12  Height: 5\' 10"  (1.778 m)     Weight: 292 lb (132.45 kg)     SpO2:  95% 95% 97%    Body mass index is 41.9 kg/(m^2).  General:  Alert and oriented, no acute distress HEENT: Right periorbital ecchymosis, no hematoma, EOMI Neck: No JVD Pulmonary: Clear to auscultation bilaterally Cardiac: Irregularly irregular, left side pacemaker Abdomen: Soft, non-tender, non-distended, no mass Skin: No rash Extremity Pulses:  2+ radial, brachial left side, absent radial and ulnar right side, monophasic right radial flow, hands pink bilaterally with fairly symmetric temperature, femoral, dorsalis pedis pulses bilaterally Musculoskeletal: No deformity or edema  Neurologic: Upper and lower extremity motor 5/5 and symmetric, no sensory or motor deficit right hand  DATA:   CBC    Component Value Date/Time   WBC 9.7 08/14/2014 1927   RBC 5.00 08/14/2014 1927   HGB 16.8* 08/14/2014 1927   HCT 49.1* 08/14/2014 1927   PLT 285 08/14/2014 1927   MCV 98.2 08/14/2014 1927   MCH 33.6 08/14/2014 1927   MCHC 34.2 08/14/2014 1927   RDW 13.6 08/14/2014 1927     BMET    Component Value Date/Time   NA 140 08/14/2014 1927   K 4.6 08/14/2014 1927   CL 103 08/14/2014 1927   CO2 25 08/14/2014 1927   GLUCOSE 188* 08/14/2014 1927   BUN 21 08/14/2014 1927   CREATININE 1.12* 08/14/2014 1927   CALCIUM 11.3* 08/14/2014 1927   GFRNONAA 47* 08/14/2014 1927   GFRAA 55* 08/14/2014 1927    ASSESSMENT:  Right brachial embolus, now with resolved symptoms in a patient on Eliquis which unfortunately is a non reversible thrombin inhibitor.  Her history of taking this medication is a little iffy and she is not really sure when she took it last.  Since she is currently asymptomatic, I believe we can safely wait a full 24 hours to make sure this has completely washed out.  We will continue her on heparin for now.  Would proceed with emergent embolectomy if  she has progressive or worsening ischemia   PLAN:  1.   Admit for IV heparin  2.  Right upper extremity duplex later today  3. Plan for right arm embolectomy on Tuesday morning 08/17/14  Risks benefits complications and procedure of embolectomy details explained to pt as well as the pathophysiology of afib embolic events and the reasons for delay in embolectomy due to her thrombin inhibitor.  Ruta Hinds, MD [facility] [place] Office: [phone] Pager: [phone]

## 2014-08-15 NOTE — Progress Notes (Signed)
VASCULAR LAB PRELIMINARY  PRELIMINARY  PRELIMINARY  PRELIMINARY  Right upper extremity arterial duplex completed.    Preliminary report:  There is thrombus noted in the right brachial artery.  Unable to visualize ulnar artery. All other right upper extremity arteries appear thrombus free.  Talton Delpriore, RVT 08/15/2014, 7:55 AM

## 2014-08-15 NOTE — Progress Notes (Signed)
Utilization Review Completed.Donne Anon T11/16/2015

## 2014-08-15 NOTE — Plan of Care (Signed)
Problem: Consults Goal: Skin Care Protocol Initiated - if Braden Score 18 or less If consults are not indicated, leave blank or document N/A  Outcome: Not Applicable Date Met:  08/15/14     

## 2014-08-15 NOTE — Plan of Care (Signed)
Problem: Consults Goal: Pharmacy Consult for anticoagulation Outcome: Completed/Met Date Met:  08/15/14

## 2014-08-15 NOTE — Progress Notes (Signed)
ANTICOAGULATION CONSULT NOTE - Follow Up Consult  Pharmacy Consult for Heparin  Indication: atrial fibrillation and right brachial thrombus  No Known Allergies  Patient Measurements: Height: 5\' 10"  (177.8 cm) Weight: 289 lb 7.4 oz (131.3 kg) IBW/kg (Calculated) : 68.5 Vital Signs: Temp: 97.6 F (36.4 C) (11/16 0400) Temp Source: Oral (11/16 0400) BP: 106/52 mmHg (11/16 0400) Pulse Rate: 79 (11/16 0400)  Labs:  Recent Labs  08/14/14 1927 08/14/14 2040 08/15/14 0122 08/15/14 0635  HGB 16.8*  --   --  15.4*  HCT 49.1*  --   --  47.1*  PLT 285  --   --  228  APTT  --  34  --  133*  LABPROT  --  14.0  --   --   INR  --  1.07  --   --   HEPARINUNFRC  --   --  1.02*  --   CREATININE 1.12*  --   --  0.97    Estimated Creatinine Clearance: 75.2 mL/min (by C-G formula based on Cr of 0.97).   Assessment: 73 y/o F on heparin for afib, brachial thrombus. On Apixaban PTA, so using aPTT to guide dosing for now. APTT is elevated at 133. Other labs as above. No issues per RN.   Goal of Therapy:  Heparin level 0.3-0.7 units/ml aPTT 66-102 seconds Monitor platelets by anticoagulation protocol: Yes   Plan:  -Decrease heparin to 1350 units/hr -1500 aPTT -Daily aPTT/HL/CBC -Monitor for bleeding  Narda Bonds 08/15/2014,7:48 AM

## 2014-08-15 NOTE — Plan of Care (Signed)
Problem: Consults Goal: Diabetes Guidelines if Diabetic/Glucose > 140 If diabetic or lab glucose is > 140 mg/dl - Initiate Diabetes/Hyperglycemia Guidelines & Document Interventions  Outcome: Completed/Met Date Met:  08/15/14

## 2014-08-15 NOTE — Plan of Care (Signed)
Problem: Consults Goal: Nutrition Consult-if indicated Outcome: Not Applicable Date Met:  15/40/08

## 2014-08-15 NOTE — Progress Notes (Addendum)
  [facility] Progress Note  08/15/2014 11:40 AM  Subjective:  Right arm feels better today. Denies any numbness or pain. Notes "weakness" when "trying to do stuff"    Filed Vitals:   08/15/14 0400  BP: 106/52  Pulse: 79  Temp: 97.6 F (36.4 C)  Resp: 18    Physical Exam: Extremities: Non palpable right radial, ulnar and brachial pulses. 2+ left radial. Hands are warm and pink bilaterally. 5/5 grip strength bilaterally.   CBC    Component Value Date/Time   WBC 7.4 08/15/2014 0635   RBC 4.64 08/15/2014 0635   HGB 15.4* 08/15/2014 0635   HCT 47.1* 08/15/2014 0635   PLT 228 08/15/2014 0635   MCV 101.5* 08/15/2014 0635   MCH 33.2 08/15/2014 0635   MCHC 32.7 08/15/2014 0635   RDW 13.9 08/15/2014 0635    BMET    Component Value Date/Time   NA 141 08/15/2014 0635   K 4.7 08/15/2014 0635   CL 104 08/15/2014 0635   CO2 26 08/15/2014 0635   GLUCOSE 109* 08/15/2014 0635   BUN 20 08/15/2014 0635   CREATININE 0.97 08/15/2014 0635   CALCIUM 10.5 08/15/2014 0635   GFRNONAA 56* 08/15/2014 0635   GFRAA 65* 08/15/2014 0635    INR    Component Value Date/Time   INR 1.07 08/14/2014 2040    No intake or output data in the 24 hours ending 08/15/14 1140   Assessment:  73 y.o. female with right brachial embolus Plan: -Right arm and hand symptoms improved. Some subjective weakness. Right upper extremity duplex today shows thrombus in right brachial artery.  -Plan for right arm embolectomy tomorrow.  -Continue heparin.    Virgina Jock, PA-C [facility] Office: [phone] Pager: [phone] 08/15/2014 11:40 AM     Duplex shows as suspected brachial embolus Hand pink warm unchanged from earlier today Embolectomy tomorrow.  Ruta Hinds, MD [facility] [place] Office: [phone] Pager: [phone]

## 2014-08-15 NOTE — ED Provider Notes (Signed)
Patient complained of sudden onset right upper arm pain while eating dinner 6 PM tonight. She is presently asymptomatic pain-free. Sent herein transfer from [facility] emergency department for evaluation by vascular surgeon.she states the color in her hand has improved much since treatment at [facility] long emergency department On exam patient is alert Glasgow Coma Score 15. Right upper extremity is cool. Not pale and no temperature differential in comparison to the left upper extremity. Full range of motion. Radial pulses are faint bilaterally  Orlie Dakin, MD 08/15/14 0002

## 2014-08-16 ENCOUNTER — Encounter (HOSPITAL_COMMUNITY)
Admission: EM | Disposition: A | Discharge status: Home / Self Care | Payer: Self-pay | Source: Home / Self Care | Attending: Vascular Surgery

## 2014-08-16 ENCOUNTER — Inpatient Hospital Stay (HOSPITAL_COMMUNITY): Payer: Medicare Other | Admitting: Anesthesiology

## 2014-08-16 DIAGNOSIS — I742 Embolism and thrombosis of arteries of the upper extremities: Secondary | ICD-10-CM

## 2014-08-16 HISTORY — PX: EMBOLECTOMY: SHX44

## 2014-08-16 LAB — APTT: aPTT: 81 seconds — ABNORMAL HIGH (ref 24–37)

## 2014-08-16 LAB — GLUCOSE, CAPILLARY
GLUCOSE-CAPILLARY: 79 mg/dL (ref 70–99)
GLUCOSE-CAPILLARY: 104 mg/dL — AB (ref 70–99)
GLUCOSE-CAPILLARY: 163 mg/dL — AB (ref 70–99)
Glucose-Capillary: 119 mg/dL — ABNORMAL HIGH (ref 70–99)
Glucose-Capillary: 159 mg/dL — ABNORMAL HIGH (ref 70–99)

## 2014-08-16 LAB — BASIC METABOLIC PANEL
Anion gap: 8 (ref 5–15)
BUN: 18 mg/dL (ref 6–23)
CALCIUM: 10.6 mg/dL — AB (ref 8.4–10.5)
CO2: 29 meq/L (ref 19–32)
Chloride: 102 mEq/L (ref 96–112)
Creatinine, Ser: 1.03 mg/dL (ref 0.50–1.10)
GFR calc Af Amer: 61 mL/min — ABNORMAL LOW (ref >90)
GFR calc non Af Amer: 52 mL/min — ABNORMAL LOW (ref >90)
GLUCOSE: 80 mg/dL (ref 70–99)
Potassium: 5.2 mEq/L (ref 3.7–5.3)
Sodium: 139 mEq/L (ref 137–147)

## 2014-08-16 LAB — CBC
HCT: 45.7 % (ref 36.0–46.0)
Hemoglobin: 15 g/dL (ref 12.0–15.0)
MCH: 32.5 pg (ref 26.0–34.0)
MCHC: 32.8 g/dL (ref 30.0–36.0)
MCV: 99.1 fL (ref 78.0–100.0)
PLATELETS: 209 10*3/uL (ref 150–400)
RBC: 4.61 MIL/uL (ref 3.87–5.11)
RDW: 13.7 % (ref 11.5–15.5)
WBC: 6.7 10*3/uL (ref 4.0–10.5)

## 2014-08-16 LAB — PROTIME-INR
INR: 1.08 (ref 0.00–1.49)
Prothrombin Time: 14.2 seconds (ref 11.6–15.2)

## 2014-08-16 LAB — HEPARIN LEVEL (UNFRACTIONATED): Heparin Unfractionated: 0.66 IU/mL (ref 0.30–0.70)

## 2014-08-16 LAB — SURGICAL PCR SCREEN
MRSA, PCR: NEGATIVE
Staphylococcus aureus: NEGATIVE

## 2014-08-16 SURGERY — EMBOLECTOMY
Anesthesia: General | Site: Arm Upper | Laterality: Right

## 2014-08-16 MED ORDER — 0.9 % SODIUM CHLORIDE (POUR BTL) OPTIME
TOPICAL | Status: DC | PRN
Start: 2014-08-16 — End: 2014-08-16
  Administered 2014-08-16: 1000 mL

## 2014-08-16 MED ORDER — FENTANYL CITRATE 0.05 MG/ML IJ SOLN
INTRAMUSCULAR | Status: AC
  Administered 2014-08-16: 50 ug via INTRAVENOUS
  Filled 2014-08-16: qty 2

## 2014-08-16 MED ORDER — FENTANYL CITRATE 0.05 MG/ML IJ SOLN
INTRAMUSCULAR | Status: AC
  Filled 2014-08-16: qty 5

## 2014-08-16 MED ORDER — METOPROLOL TARTRATE 25 MG PO TABS
25.0000 mg | ORAL_TABLET | Freq: Once | ORAL | Status: AC
  Administered 2014-08-16: 25 mg via ORAL
  Filled 2014-08-16: qty 1

## 2014-08-16 MED ORDER — HEPARIN SODIUM (PORCINE) 1000 UNIT/ML IJ SOLN
INTRAMUSCULAR | Status: DC | PRN
  Administered 2014-08-16: 4000 [IU] via INTRAVENOUS

## 2014-08-16 MED ORDER — PHENYLEPHRINE 40 MCG/ML (10ML) SYRINGE FOR IV PUSH (FOR BLOOD PRESSURE SUPPORT)
PREFILLED_SYRINGE | INTRAVENOUS | Status: AC
  Filled 2014-08-16: qty 10

## 2014-08-16 MED ORDER — PROPOFOL 10 MG/ML IV BOLUS
INTRAVENOUS | Status: DC | PRN
  Administered 2014-08-16: 150 mg via INTRAVENOUS

## 2014-08-16 MED ORDER — SODIUM CHLORIDE 0.9 % IR SOLN
Status: DC | PRN
  Administered 2014-08-16: 500 mL

## 2014-08-16 MED ORDER — ONDANSETRON HCL 4 MG/2ML IJ SOLN
INTRAMUSCULAR | Status: DC | PRN
  Administered 2014-08-16: 4 mg via INTRAVENOUS

## 2014-08-16 MED ORDER — PROMETHAZINE HCL 25 MG/ML IJ SOLN: 6.2500 mg | INTRAMUSCULAR | Status: DC | PRN

## 2014-08-16 MED ORDER — PHENYLEPHRINE HCL 10 MG/ML IJ SOLN
INTRAMUSCULAR | Status: DC | PRN
  Administered 2014-08-16: 80 ug via INTRAVENOUS

## 2014-08-16 MED ORDER — MENTHOL 3 MG MT LOZG
1.0000 | LOZENGE | OROMUCOSAL | Status: DC | PRN
  Filled 2014-08-16 (×4): qty 9

## 2014-08-16 MED ORDER — FENTANYL CITRATE 0.05 MG/ML IJ SOLN
INTRAMUSCULAR | Status: DC | PRN
  Administered 2014-08-16: 100 ug via INTRAVENOUS
  Administered 2014-08-16: 50 ug via INTRAVENOUS

## 2014-08-16 MED ORDER — LIDOCAINE HCL (CARDIAC) 20 MG/ML IV SOLN
INTRAVENOUS | Status: DC | PRN
  Administered 2014-08-16: 50 mg via INTRAVENOUS

## 2014-08-16 MED ORDER — HEPARIN (PORCINE) IN NACL 100-0.45 UNIT/ML-% IJ SOLN
1300.0000 [IU]/h | INTRAMUSCULAR | Status: DC
  Administered 2014-08-16: 1200 [IU]/h via INTRAVENOUS
  Administered 2014-08-17: 1300 [IU]/h via INTRAVENOUS
  Filled 2014-08-16 (×4): qty 250

## 2014-08-16 MED ORDER — NEOSTIGMINE METHYLSULFATE 10 MG/10ML IV SOLN
INTRAVENOUS | Status: DC | PRN
  Administered 2014-08-16: 3 mg via INTRAVENOUS

## 2014-08-16 MED ORDER — FENTANYL CITRATE 0.05 MG/ML IJ SOLN
25.0000 ug | INTRAMUSCULAR | Status: DC | PRN
  Administered 2014-08-16 (×2): 50 ug via INTRAVENOUS

## 2014-08-16 MED ORDER — ROCURONIUM BROMIDE 100 MG/10ML IV SOLN
INTRAVENOUS | Status: DC | PRN
  Administered 2014-08-16: 40 mg via INTRAVENOUS

## 2014-08-16 MED ORDER — LIDOCAINE HCL 1 % IJ SOLN
INTRAMUSCULAR | Status: DC | PRN
  Administered 2014-08-16: 6 mL

## 2014-08-16 MED ORDER — MEPERIDINE HCL 25 MG/ML IJ SOLN: 6.2500 mg | INTRAMUSCULAR | Status: DC | PRN

## 2014-08-16 MED ORDER — LIDOCAINE HCL (PF) 1 % IJ SOLN
INTRAMUSCULAR | Status: AC
  Filled 2014-08-16: qty 30

## 2014-08-16 MED ORDER — LACTATED RINGERS IV SOLN
INTRAVENOUS | Status: DC
  Administered 2014-08-16: 11:00:00 via INTRAVENOUS

## 2014-08-16 MED ORDER — GLYCOPYRROLATE 0.2 MG/ML IJ SOLN
INTRAMUSCULAR | Status: DC | PRN
  Administered 2014-08-16: .4 mg via INTRAVENOUS

## 2014-08-16 SURGICAL SUPPLY — 64 items
ADH SKN CLS APL DERMABOND .7 (GAUZE/BANDAGES/DRESSINGS) ×1
ADH SKN CLS LQ APL DERMABOND (GAUZE/BANDAGES/DRESSINGS) ×1
BANDAGE ELASTIC 4 VELCRO ST LF (GAUZE/BANDAGES/DRESSINGS) IMPLANT
BANDAGE ESMARK 6X9 LF (GAUZE/BANDAGES/DRESSINGS) IMPLANT
BNDG CMPR 9X6 STRL LF SNTH (GAUZE/BANDAGES/DRESSINGS)
BNDG ESMARK 6X9 LF (GAUZE/BANDAGES/DRESSINGS)
CANISTER SUCTION 2500CC (MISCELLANEOUS) ×3 IMPLANT
CATH EMB 2FR 60CM (CATHETERS) ×2 IMPLANT
CATH EMB 3FR 40CM (CATHETERS) ×2 IMPLANT
CATH EMB 3FR 80CM (CATHETERS) IMPLANT
CATH EMB 4FR 80CM (CATHETERS) IMPLANT
CATH EMB 5FR 80CM (CATHETERS) IMPLANT
CLIP TI MEDIUM 24 (CLIP) ×3 IMPLANT
CLIP TI WIDE RED SMALL 24 (CLIP) ×3 IMPLANT
COVER SURGICAL LIGHT HANDLE (MISCELLANEOUS) ×3 IMPLANT
CUFF TOURNIQUET SINGLE 24IN (TOURNIQUET CUFF) IMPLANT
CUFF TOURNIQUET SINGLE 34IN LL (TOURNIQUET CUFF) IMPLANT
CUFF TOURNIQUET SINGLE 44IN (TOURNIQUET CUFF) IMPLANT
DERMABOND ADHESIVE PROPEN (GAUZE/BANDAGES/DRESSINGS) ×2
DERMABOND ADVANCED (GAUZE/BANDAGES/DRESSINGS) ×2
DERMABOND ADVANCED .7 DNX12 (GAUZE/BANDAGES/DRESSINGS) ×1 IMPLANT
DERMABOND ADVANCED .7 DNX6 (GAUZE/BANDAGES/DRESSINGS) IMPLANT
DRAIN CHANNEL 15F RND FF W/TCR (WOUND CARE) IMPLANT
DRAPE WARM FLUID 44X44 (DRAPE) ×1 IMPLANT
DRAPE X-RAY CASS 24X20 (DRAPES) IMPLANT
DRSG COVADERM 4X10 (GAUZE/BANDAGES/DRESSINGS) IMPLANT
DRSG COVADERM 4X8 (GAUZE/BANDAGES/DRESSINGS) IMPLANT
ELECT REM PT RETURN 9FT ADLT (ELECTROSURGICAL) ×3
ELECTRODE REM PT RTRN 9FT ADLT (ELECTROSURGICAL) ×1 IMPLANT
EVACUATOR SILICONE 100CC (DRAIN) IMPLANT
GLOVE BIOGEL PI IND STRL 7.5 (GLOVE) ×1 IMPLANT
GLOVE BIOGEL PI INDICATOR 7.5 (GLOVE) ×2
GLOVE SURG SS PI 7.5 STRL IVOR (GLOVE) ×3 IMPLANT
GOWN STRL REUS W/ TWL LRG LVL3 (GOWN DISPOSABLE) ×2 IMPLANT
GOWN STRL REUS W/ TWL XL LVL3 (GOWN DISPOSABLE) ×1 IMPLANT
GOWN STRL REUS W/TWL LRG LVL3 (GOWN DISPOSABLE) ×6
GOWN STRL REUS W/TWL XL LVL3 (GOWN DISPOSABLE) ×3
HEMOSTAT SNOW SURGICEL 2X4 (HEMOSTASIS) IMPLANT
KIT BASIN OR (CUSTOM PROCEDURE TRAY) ×3 IMPLANT
KIT ROOM TURNOVER OR (KITS) ×3 IMPLANT
NS IRRIG 1000ML POUR BTL (IV SOLUTION) ×4 IMPLANT
PACK PERIPHERAL VASCULAR (CUSTOM PROCEDURE TRAY) ×1 IMPLANT
PAD ARMBOARD 7.5X6 YLW CONV (MISCELLANEOUS) ×6 IMPLANT
PADDING CAST COTTON 6X4 STRL (CAST SUPPLIES) IMPLANT
SET COLLECT BLD 21X3/4 12 (NEEDLE) IMPLANT
STOPCOCK 4 WAY LG BORE MALE ST (IV SETS) IMPLANT
SUT ETHILON 3 0 PS 1 (SUTURE) IMPLANT
SUT PROLENE 5 0 C 1 24 (SUTURE) IMPLANT
SUT PROLENE 6 0 BV (SUTURE) ×5 IMPLANT
SUT PROLENE 7 0 BV 1 (SUTURE) IMPLANT
SUT SILK 3 0 (SUTURE)
SUT SILK 3-0 18XBRD TIE 12 (SUTURE) IMPLANT
SUT VIC AB 2-0 CT1 27 (SUTURE)
SUT VIC AB 2-0 CT1 TAPERPNT 27 (SUTURE) ×1 IMPLANT
SUT VIC AB 3-0 SH 27 (SUTURE) ×3
SUT VIC AB 3-0 SH 27X BRD (SUTURE) ×1 IMPLANT
SUT VICRYL 4-0 PS2 18IN ABS (SUTURE) ×2 IMPLANT
SYR 3ML LL SCALE MARK (SYRINGE) ×1 IMPLANT
TOWEL OR 17X24 6PK STRL BLUE (TOWEL DISPOSABLE) ×4 IMPLANT
TOWEL OR 17X26 10 PK STRL BLUE (TOWEL DISPOSABLE) ×6 IMPLANT
TRAY FOLEY CATH 16FRSI W/METER (SET/KITS/TRAYS/PACK) ×1 IMPLANT
TUBING EXTENTION W/L.L. (IV SETS) IMPLANT
UNDERPAD 30X30 INCONTINENT (UNDERPADS AND DIAPERS) ×3 IMPLANT
WATER STERILE IRR 1000ML POUR (IV SOLUTION) ×3 IMPLANT

## 2014-08-16 NOTE — Interval H&P Note (Signed)
History and Physical Interval Note:  08/16/2014 11:38 AM  [name]  has presented today for surgery, with the diagnosis of Right Brachial Artery Embolus  The various methods of treatment have been discussed with the patient and family. After consideration of risks, benefits and other options for treatment, the patient has consented to  Procedure(s): EMBOLECTOMY- RIGHT BRACHIAL ARTERY (Right) as a surgical intervention .  The patient's history has been reviewed, patient examined, no change in status, stable for surgery.  I have reviewed the patient's chart and labs.  Questions were answered to the patient's satisfaction.     Nickolas Chalfin IV, V. WELLS

## 2014-08-16 NOTE — Transfer of Care (Signed)
Immediate Anesthesia Transfer of Care Note  Patient: [name]  Procedure(s) Performed: Procedure(s): EMBOLECTOMY- RIGHT BRACHIAL ARTERY (Right)  Patient Location: PACU  Anesthesia Type:General  Level of Consciousness: awake, alert  and oriented  Airway & Oxygen Therapy: Patient Spontanous Breathing and Patient connected to nasal cannula oxygen  Post-op Assessment: Report given to PACU RN and Post -op Vital signs reviewed and stable  Post vital signs: Reviewed and stable  Complications: No apparent anesthesia complications

## 2014-08-16 NOTE — Anesthesia Postprocedure Evaluation (Signed)
  Anesthesia Post-op Note  Patient: [name]  Procedure(s) Performed: Procedure(s): EMBOLECTOMY- RIGHT BRACHIAL ARTERY (Right)  Patient Location: PACU  Anesthesia Type:General  Level of Consciousness: awake and alert   Airway and Oxygen Therapy: Patient Spontanous Breathing  Post-op Pain: mild  Post-op Assessment: Post-op Vital signs reviewed, Patient's Cardiovascular Status Stable, Respiratory Function Stable, Patent Airway and No signs of Nausea or vomiting  Post-op Vital Signs: Reviewed and stable  Last Vitals:  Filed Vitals:   08/16/14 1324  BP: 137/89  Pulse: 90  Temp: 36.4 C  Resp: 22    Complications: No apparent anesthesia complications

## 2014-08-16 NOTE — Op Note (Signed)
    Patient name: [name] MRN: [id number] DOB: 07-28-1941 Sex: female  08/14/2014 - 08/16/2014 Pre-operative Diagnosis: right brachial embolus Post-operative diagnosis:  Same Surgeon:  Eldridge Abrahams Assistants:  Silva Bandy Procedure:   Right brachial, ulnar, and radial artery embolectomy Anesthesia:  Gen. Blood Loss:  See anesthesia record Specimens:  embolus  Findings:  Embolus extending into the distal brachial artery and radial and ulnar arteries.  Indications:  The patient was admitted over the weekend with an embolus to her right hand.  She was taken off of her anticoagulation in anticipation of surgery today.  She does not have an ischemic hand but gets weakness with minimal activity.  Ultrasound identified the thrombus in the distal brachial artery.  Procedure:  The patient was identified in the holding area and taken to [place] 10  The patient was then placed supine on the table. general anesthesia was administered.  The patient was prepped and draped in the usual sterile fashion.  A time out was called and antibiotics were administered.  I utilized ultrasound to identify the bifurcation of the right brachial artery.  This was just below the antecubital crease.  Therefore, I elected to make a longitudinal incision below the antecubital crease.  Cautery was used to divide the subcutaneous tissue.  The fascia was then divided sharply.  I dissected out the brachial artery as well as the origin of the radial and ulnar artery.  Systemic heparinization was administered.  After the heparin had circulated Silastic loops were used to occlude the radial ulnar and brachial arteries.  A transverse arteriotomy was made in the distal brachial artery.  Organized thrombus was identified.  This was milked out and sent as a specimen.  I passed a #3 Fogarty proximally and evacuated additional thrombus.  There was excellent inflow.  Negative pass was then performed.  I then had to utilize a #2  Fogarty to go down into the radial and ulnar artery.  There was thrombus within each of the proximal radial and ulnar arteries.  This was evacuated.  Negative passes were performed.  There was good backbleeding.  The arteriotomy was closed transversely with 6-0 Prolene.  Doppler interrogation revealed multiphasic signals in the radial and ulnar arteries at the wrist as well as a palmar arch signal.  I did not administer protamine.  Hemostasis was satisfactory.  The incision was closed with 30 and 4-0 Vicryl followed by Dermabond.   Disposition:  To PACU in stable condition.   Theotis Burrow, M.D. [facility] [place] Office: [phone] Pager:  [phone]

## 2014-08-16 NOTE — H&P (View-Only) (Signed)
  [facility] Progress Note  08/15/2014 11:40 AM  Subjective:  Right arm feels better today. Denies any numbness or pain. Notes "weakness" when "trying to do stuff"    Filed Vitals:   08/15/14 0400  BP: 106/52  Pulse: 79  Temp: 97.6 F (36.4 C)  Resp: 18    Physical Exam: Extremities: Non palpable right radial, ulnar and brachial pulses. 2+ left radial. Hands are warm and pink bilaterally. 5/5 grip strength bilaterally.   CBC    Component Value Date/Time   WBC 7.4 08/15/2014 0635   RBC 4.64 08/15/2014 0635   HGB 15.4* 08/15/2014 0635   HCT 47.1* 08/15/2014 0635   PLT 228 08/15/2014 0635   MCV 101.5* 08/15/2014 0635   MCH 33.2 08/15/2014 0635   MCHC 32.7 08/15/2014 0635   RDW 13.9 08/15/2014 0635    BMET    Component Value Date/Time   NA 141 08/15/2014 0635   K 4.7 08/15/2014 0635   CL 104 08/15/2014 0635   CO2 26 08/15/2014 0635   GLUCOSE 109* 08/15/2014 0635   BUN 20 08/15/2014 0635   CREATININE 0.97 08/15/2014 0635   CALCIUM 10.5 08/15/2014 0635   GFRNONAA 56* 08/15/2014 0635   GFRAA 65* 08/15/2014 0635    INR    Component Value Date/Time   INR 1.07 08/14/2014 2040    No intake or output data in the 24 hours ending 08/15/14 1140   Assessment:  73 y.o. female with right brachial embolus Plan: -Right arm and hand symptoms improved. Some subjective weakness. Right upper extremity duplex today shows thrombus in right brachial artery.  -Plan for right arm embolectomy tomorrow.  -Continue heparin.    Virgina Jock, PA-C [facility] Office: [phone] Pager: [phone] 08/15/2014 11:40 AM     Duplex shows as suspected brachial embolus Hand pink warm unchanged from earlier today Embolectomy tomorrow.  Ruta Hinds, MD [facility] [place] Office: [phone] Pager: [phone]

## 2014-08-16 NOTE — Progress Notes (Signed)
ANTICOAGULATION CONSULT NOTE   Pharmacy Consult for Heparin  Indication: atrial fibrillation and right brachial thrombus  No Known Allergies  Patient Measurements: Height: 5\' 10"  (177.8 cm) Weight: 289 lb 7.4 oz (131.3 kg) IBW/kg (Calculated) : 68.5 Vital Signs: Temp: 99.7 F (37.6 C) (11/16 1940) Temp Source: Oral (11/16 1940) BP: 124/57 mmHg (11/16 1940) Pulse Rate: 100 (11/16 1940)  Labs:  Recent Labs  08/14/14 1927  08/14/14 2040 08/15/14 0122 08/15/14 0635 08/15/14 1350 08/15/14 2239  HGB 16.8*  --   --   --  15.4*  --   --   HCT 49.1*  --   --   --  47.1*  --   --   PLT 285  --   --   --  228  --   --   APTT  --   < > 34  --  133* 133* 81*  LABPROT  --   --  14.0  --   --   --   --   INR  --   --  1.07  --   --   --   --   HEPARINUNFRC  --   --   --  1.02* 1.00*  --   --   CREATININE 1.12*  --   --   --  0.97  --   --   < > = values in this interval not displayed.  Estimated Creatinine Clearance: 75.2 mL/min (by C-G formula based on Cr of 0.97).   Assessment: 73 y/o Female with h/o Afib, Eliquis on hold, new brachial thrombus, for heparin  Goal of Therapy:  Heparin level 0.3-0.7 units/ml aPTT 66-102 seconds Monitor platelets by anticoagulation protocol: Yes   Plan:  Continue Heparin at current rate  Follow-up am labs.  Ellyana Crigler, Bronson Curb 08/16/2014,12:21 AM

## 2014-08-16 NOTE — Plan of Care (Signed)
Problem: Phase II Progression Outcomes Goal: Tolerating diet Outcome: Completed/Met Date Met:  08/16/14

## 2014-08-16 NOTE — Anesthesia Preprocedure Evaluation (Addendum)
Anesthesia Evaluation  Patient identified by MRN, date of birth, ID band Patient awake    Reviewed: Allergy & Precautions, H&P , NPO status , Patient's Chart, lab work & pertinent test results, reviewed documented beta blocker date and time   Airway Mallampati: II   Neck ROM: Full    Dental  (+) Teeth Intact   Pulmonary former smoker (quit 1988),  breath sounds clear to auscultation        Cardiovascular hypertension, Pt. on medications + Peripheral Vascular Disease + dysrhythmias Atrial Fibrillation + pacemaker Rhythm:Irregular  EKG,  AF   Neuro/Psych    GI/Hepatic   Endo/Other  diabetes, Well Controlled, Type 2  Renal/GU      Musculoskeletal   Abdominal (+) + obese,   Peds  Hematology   Anesthesia Other Findings   Reproductive/Obstetrics                          Anesthesia Physical Anesthesia Plan  ASA: III  Anesthesia Plan: General   Post-op Pain Management:    Induction: Intravenous  Airway Management Planned: Oral ETT  Additional Equipment:   Intra-op Plan:   Post-operative Plan: Extubation in OR  Informed Consent: I have reviewed the patients History and Physical, chart, labs and discussed the procedure including the risks, benefits and alternatives for the proposed anesthesia with the patient or authorized representative who has indicated his/her understanding and acceptance.     Plan Discussed with:   Anesthesia Plan Comments: (Check am labs, ok 08/15/2014.  AM labs OK, creat1.09, BS 108)      Anesthesia Quick Evaluation

## 2014-08-16 NOTE — Progress Notes (Signed)
ANTICOAGULATION CONSULT NOTE - Follow Up Consult  Pharmacy Consult for Heparin Indication: atrial fibrillation and right brachial embolus  No Known Allergies  Patient Measurements: Height: 5\' 10"  (177.8 cm) Weight: 289 lb 7.4 oz (131.3 kg) IBW/kg (Calculated) : 68.5 Heparin Dosing Weight: 99 kg  Vital Signs: Temp: 97.8 F (36.6 C) (11/17 1456) Temp Source: Oral (11/17 1456) BP: 135/76 mmHg (11/17 1456) Pulse Rate: 55 (11/17 1456)  Labs:  Recent Labs  08/14/14 1927  08/14/14 2040 08/15/14 0122 08/15/14 0635 08/15/14 1350 08/15/14 2239 08/16/14 0441  HGB 16.8*  --   --   --  15.4*  --   --  15.0  HCT 49.1*  --   --   --  47.1*  --   --  45.7  PLT 285  --   --   --  228  --   --  209  APTT  --   < > 34  --  133* 133* 81*  --   LABPROT  --   --  14.0  --   --   --   --  14.2  INR  --   --  1.07  --   --   --   --  1.08  HEPARINUNFRC  --   --   --  1.02* 1.00*  --   --  0.66  CREATININE 1.12*  --   --   --  0.97  --   --  1.03  < > = values in this interval not displayed.  Estimated Creatinine Clearance: 70.8 mL/min (by C-G formula based on Cr of 1.03).   Medications:  Scheduled:  . docusate sodium  100 mg Oral BID  . insulin aspart  0-15 Units Subcutaneous TID WC  . insulin glargine  30 Units Subcutaneous q morning - 10a  . metoprolol tartrate  25 mg Oral BID  . multivitamin with minerals  1 tablet Oral Daily  . pantoprazole  40 mg Oral Daily  . sulfaSALAzine  500 mg Oral Q breakfast   Infusions:  . heparin      Assessment: 73 yo F on IV heparin for R brachial embolus s/p embolectomy earlier today.  Pt to restart on heparin at 5pm tonight.    Pt was on Apixaban PTA - follow up restart.   Goal of Therapy:  Heparin level 0.3-0.7 units/ml Monitor platelets by anticoagulation protocol: Yes   Plan:  Heparin to start at 5pm tonight at previous therapeutic rate of 1200 units/hr. Heparin level in 6 hours. Continue daily heparin level and CBC  Anquanette Bahner, Pharm.D., BCPS Clinical Pharmacist Pager [phone] 08/16/2014 3:00 PM

## 2014-08-17 ENCOUNTER — Encounter (HOSPITAL_COMMUNITY): Payer: Self-pay | Admitting: Surgery

## 2014-08-17 LAB — CBC
HEMATOCRIT: 43.7 % (ref 36.0–46.0)
HEMOGLOBIN: 14.5 g/dL (ref 12.0–15.0)
MCH: 32.4 pg (ref 26.0–34.0)
MCHC: 33.2 g/dL (ref 30.0–36.0)
MCV: 97.5 fL (ref 78.0–100.0)
Platelets: 213 10*3/uL (ref 150–400)
RBC: 4.48 MIL/uL (ref 3.87–5.11)
RDW: 13.6 % (ref 11.5–15.5)
WBC: 9.2 10*3/uL (ref 4.0–10.5)

## 2014-08-17 LAB — BASIC METABOLIC PANEL
Anion gap: 10 (ref 5–15)
BUN: 17 mg/dL (ref 6–23)
CHLORIDE: 100 meq/L (ref 96–112)
CO2: 27 mEq/L (ref 19–32)
Calcium: 10.2 mg/dL (ref 8.4–10.5)
Creatinine, Ser: 0.89 mg/dL (ref 0.50–1.10)
GFR calc Af Amer: 72 mL/min — ABNORMAL LOW (ref >90)
GFR, EST NON AFRICAN AMERICAN: 62 mL/min — AB (ref >90)
Glucose, Bld: 100 mg/dL — ABNORMAL HIGH (ref 70–99)
Potassium: 4.8 mEq/L (ref 3.7–5.3)
Sodium: 137 mEq/L (ref 137–147)

## 2014-08-17 LAB — GLUCOSE, CAPILLARY
GLUCOSE-CAPILLARY: 111 mg/dL — AB (ref 70–99)
GLUCOSE-CAPILLARY: 171 mg/dL — AB (ref 70–99)
GLUCOSE-CAPILLARY: 118 mg/dL — AB (ref 70–99)
Glucose-Capillary: 144 mg/dL — ABNORMAL HIGH (ref 70–99)

## 2014-08-17 LAB — HEPARIN LEVEL (UNFRACTIONATED)
HEPARIN UNFRACTIONATED: 0.37 [IU]/mL (ref 0.30–0.70)
HEPARIN UNFRACTIONATED: 0.38 [IU]/mL (ref 0.30–0.70)
Heparin Unfractionated: 0.2 IU/mL — ABNORMAL LOW (ref 0.30–0.70)

## 2014-08-17 NOTE — Progress Notes (Signed)
ANTICOAGULATION CONSULT NOTE - Follow Up Consult  Pharmacy Consult for Heparin Indication: atrial fibrillation and right brachial embolus  No Known Allergies  Patient Measurements: Height: 5\' 10"  (177.8 cm) Weight: 289 lb 7.4 oz (131.3 kg) IBW/kg (Calculated) : 68.5 Heparin Dosing Weight: 99 kg  Vital Signs: Temp: 98.8 F (37.1 C) (11/18 1353) Temp Source: Oral (11/18 1353) BP: 120/64 mmHg (11/18 1353) Pulse Rate: 67 (11/18 1353)  Labs:  Recent Labs  08/14/14 2040  08/15/14 0635 08/15/14 1350 08/15/14 2239 08/16/14 0441  08/17/14 0146 08/17/14 0933 08/17/14 1505  HGB  --   --  15.4*  --   --  15.0  --  14.5  --   --   HCT  --   --  47.1*  --   --  45.7  --  43.7  --   --   PLT  --   --  228  --   --  209  --  213  --   --   APTT 34  --  133* 133* 81*  --   --   --   --   --   LABPROT 14.0  --   --   --   --  14.2  --   --   --   --   INR 1.07  --   --   --   --  1.08  --   --   --   --   HEPARINUNFRC  --   < > 1.00*  --   --  0.66  < > 0.20* 0.37 0.38  CREATININE  --   --  0.97  --   --  1.03  --  0.89  --   --   < > = values in this interval not displayed.  Estimated Creatinine Clearance: 81.9 mL/min (by C-G formula based on Cr of 0.89).   Medications:  Scheduled:  . docusate sodium  100 mg Oral BID  . insulin aspart  0-15 Units Subcutaneous TID WC  . insulin glargine  30 Units Subcutaneous q morning - 10a  . metoprolol tartrate  25 mg Oral BID  . multivitamin with minerals  1 tablet Oral Daily  . pantoprazole  40 mg Oral Daily  . sulfaSALAzine  500 mg Oral Q breakfast   Infusions:  . heparin 1,300 Units/hr (08/17/14 1337)    Assessment: 6 hour heparain level is 0.38, remains therapeutic on rate of 1300 units/hr in this 73 yo F on IV heparin for R brachial embolus, POD #1 s/p embolectomy.  No bleeding noted except this AM report noted of  small hematoma distal to incision with ecchymosis.  CBC stable. She was on Apixaban PTA which is on hold.  Pending Dr.  Stephens Shire decision as to when to convert pt to po anticoagulation.    Goal of Therapy:  Heparin level 0.3-0.7 units/ml Monitor platelets by anticoagulation protocol: Yes   Plan:  Continue IV Heparin 1300 units/hr. Daily heparin level and CBC Follow up restart of apixaban   Nicole Cella, RPh Clinical Pharmacist Pager: [phone] 08/17/2014 4:28 PM

## 2014-08-17 NOTE — Progress Notes (Signed)
ANTICOAGULATION CONSULT NOTE - Follow Up Consult  Pharmacy Consult for Heparin Indication: atrial fibrillation and right brachial embolus  No Known Allergies  Patient Measurements: Height: 5\' 10"  (177.8 cm) Weight: 289 lb 7.4 oz (131.3 kg) IBW/kg (Calculated) : 68.5 Heparin Dosing Weight: 99 kg  Vital Signs: Temp: 98.8 F (37.1 C) (11/18 0508) Temp Source: Oral (11/18 0508) BP: 113/71 mmHg (11/18 0508) Pulse Rate: 84 (11/18 0508)  Labs:  Recent Labs  08/14/14 2040  08/15/14 0635 08/15/14 1350 08/15/14 2239 08/16/14 0441 08/16/14 2326 08/17/14 0146 08/17/14 0933  HGB  --   --  15.4*  --   --  15.0  --  14.5  --   HCT  --   --  47.1*  --   --  45.7  --  43.7  --   PLT  --   --  228  --   --  209  --  213  --   APTT 34  --  133* 133* 81*  --   --   --   --   LABPROT 14.0  --   --   --   --  14.2  --   --   --   INR 1.07  --   --   --   --  1.08  --   --   --   HEPARINUNFRC  --   < > 1.00*  --   --  0.66 >2.20* 0.20* 0.37  CREATININE  --   --  0.97  --   --  1.03  --  0.89  --   < > = values in this interval not displayed.  Estimated Creatinine Clearance: 81.9 mL/min (by C-G formula based on Cr of 0.89).   Medications:  Scheduled:  . docusate sodium  100 mg Oral BID  . insulin aspart  0-15 Units Subcutaneous TID WC  . insulin glargine  30 Units Subcutaneous q morning - 10a  . metoprolol tartrate  25 mg Oral BID  . multivitamin with minerals  1 tablet Oral Daily  . pantoprazole  40 mg Oral Daily  . sulfaSALAzine  500 mg Oral Q breakfast   Infusions:  . heparin 1,300 Units/hr (08/17/14 0243)    Assessment: 73 yo F on IV heparin for R brachial embolus, POD #1 s/p embolectomy. Heparin infusion restarted yesterday at 5pm and rate increased to 1300 units/hr ~2:30 this AM.  This morning the 8 hour heparin level is 0.37, therapeutic.  CBC stable. Small hematoma distal to incision with ecchymosis.  She was on Apixaban PTA which is on hold.  Pending Dr. Stephens Shire  decision as to when to convert pt to po anticoagulation. She was on Apixaban PTA.   Goal of Therapy:  Heparin level 0.3-0.7 units/ml Monitor platelets by anticoagulation protocol: Yes   Plan:  Continue IV Heparin1300 units/hr. Recheck heparin level in ~6 hours to confirm remains therapeutic x 2 before changing to daily heparin levels.  Daily heparin level and CBC Follow up restart of apixaban   Nicole Cella, RPh Clinical Pharmacist Pager: [phone] 08/17/2014 11:10 AM

## 2014-08-17 NOTE — Progress Notes (Signed)
  Progress Note    08/17/2014 7:18 AM 1 Day Post-Op  Subjective:  No complaints this am  Afebrile 60's-80's Afib 465'[id number]'X systolic 51% [id number]  Filed Vitals:   08/17/14 0508  BP: 113/71  Pulse: 84  Temp: 98.8 F (37.1 C)  Resp: 18    Physical Exam: Cardiac:  irregular Lungs:  Non labored Incisions:  Small hematoma distal to incision with ecchymosis; 2+ radial palpable pulse Extremities:  Motor and sensation are in tact in right hand   CBC    Component Value Date/Time   WBC 9.2 08/17/2014 0146   RBC 4.48 08/17/2014 0146   HGB 14.5 08/17/2014 0146   HCT 43.7 08/17/2014 0146   PLT 213 08/17/2014 0146   MCV 97.5 08/17/2014 0146   MCH 32.4 08/17/2014 0146   MCHC 33.2 08/17/2014 0146   RDW 13.6 08/17/2014 0146    BMET    Component Value Date/Time   NA 137 08/17/2014 0146   K 4.8 08/17/2014 0146   CL 100 08/17/2014 0146   CO2 27 08/17/2014 0146   GLUCOSE 100* 08/17/2014 0146   BUN 17 08/17/2014 0146   CREATININE 0.89 08/17/2014 0146   CALCIUM 10.2 08/17/2014 0146   GFRNONAA 62* 08/17/2014 0146   GFRAA 72* 08/17/2014 0146    INR    Component Value Date/Time   INR 1.08 08/16/2014 0441     Intake/Output Summary (Last 24 hours) at 08/17/14 0718 Last data filed at 08/17/14 0249  Gross per 24 hour  Intake    920 ml  Output    750 ml  Net    170 ml     Assessment:  73 y.o. female is s/p:  Right brachial, ulnar, and radial artery embolectomy  1 Day Post-Op  Plan: -pt doing well this am-right hand is warm and well perfused with sensation and motor in tact. -DVT prophylaxis:  Heparin gtt -pt will need 2D echo as I don't see that she has had one-will d/w Dr. Trula Slade -pathology for path is not back yet--will need to check this in the next couple of days. -will d/w Dr. Trula Slade when to convert pt to po anticoagulation   Leontine Locket, PA-C [facility] [phone] 08/17/2014 7:18 AM    POD#1.  Palpable right radial pulse.   Small hematoma around incision with surrounding ecchymosis.  Not expanding.  This is expected given re-initiation shortly after embolectomy.  Re-start oral anticoagulation and plan for d/c home tomorrow  Annamarie Major

## 2014-08-17 NOTE — Progress Notes (Signed)
ANTICOAGULATION CONSULT NOTE   Pharmacy Consult for Heparin  Indication: atrial fibrillation and right brachial thrombus  No Known Allergies  Patient Measurements: Height: 5\' 10"  (177.8 cm) Weight: 289 lb 7.4 oz (131.3 kg) IBW/kg (Calculated) : 68.5 Vital Signs: Temp: 97.8 F (36.6 C) (11/17 1456) Temp Source: Oral (11/17 1456) BP: 127/85 mmHg (11/17 2036) Pulse Rate: 107 (11/17 2036)  Labs:  Recent Labs  08/14/14 2040  08/15/14 0635 08/15/14 1350 08/15/14 2239 08/16/14 0441 08/16/14 2326 08/17/14 0146  HGB  --   --  15.4*  --   --  15.0  --  14.5  HCT  --   --  47.1*  --   --  45.7  --  43.7  PLT  --   --  228  --   --  209  --  213  APTT 34  --  133* 133* 81*  --   --   --   LABPROT 14.0  --   --   --   --  14.2  --   --   INR 1.07  --   --   --   --  1.08  --   --   HEPARINUNFRC  --   < > 1.00*  --   --  0.66 >2.20* 0.20*  CREATININE  --   --  0.97  --   --  1.03  --  0.89  < > = values in this interval not displayed.  Estimated Creatinine Clearance: 81.9 mL/min (by C-G formula based on Cr of 0.89).   Assessment: 73 y/o Female with h/o Afib, Eliquis on hold, s/p embolectomy, for heparin  Goal of Therapy:  Heparin level 0.3-0.7 units/ml aPTT 66-102 seconds Monitor platelets by anticoagulation protocol: Yes   Plan:  Increase Heparin 1300 units/hrs  Check heparin level in 8 hours.   Ngozi Alvidrez, Bronson Curb 08/17/2014,2:37 AM

## 2014-08-18 ENCOUNTER — Telehealth: Payer: Self-pay | Admitting: Surgery

## 2014-08-18 ENCOUNTER — Encounter: Payer: Self-pay | Admitting: Internal Medicine

## 2014-08-18 LAB — BASIC METABOLIC PANEL
ANION GAP: 12 (ref 5–15)
BUN: 13 mg/dL (ref 6–23)
CHLORIDE: 101 meq/L (ref 96–112)
CO2: 25 meq/L (ref 19–32)
Calcium: 9.9 mg/dL (ref 8.4–10.5)
Creatinine, Ser: 0.75 mg/dL (ref 0.50–1.10)
GFR calc Af Amer: >90 mL/min (ref >90)
GFR calc non Af Amer: 81 mL/min — ABNORMAL LOW (ref >90)
GLUCOSE: 105 mg/dL — AB (ref 70–99)
Potassium: 4.2 mEq/L (ref 3.7–5.3)
SODIUM: 138 meq/L (ref 137–147)

## 2014-08-18 LAB — CBC
HCT: 42.7 % (ref 36.0–46.0)
HEMOGLOBIN: 14.4 g/dL (ref 12.0–15.0)
MCH: 32.7 pg (ref 26.0–34.0)
MCHC: 33.7 g/dL (ref 30.0–36.0)
MCV: 97 fL (ref 78.0–100.0)
PLATELETS: 215 10*3/uL (ref 150–400)
RBC: 4.4 MIL/uL (ref 3.87–5.11)
RDW: 13.4 % (ref 11.5–15.5)
WBC: 6.3 10*3/uL (ref 4.0–10.5)

## 2014-08-18 LAB — GLUCOSE, CAPILLARY
GLUCOSE-CAPILLARY: 103 mg/dL — AB (ref 70–99)
GLUCOSE-CAPILLARY: 124 mg/dL — AB (ref 70–99)

## 2014-08-18 LAB — HEPARIN LEVEL (UNFRACTIONATED): HEPARIN UNFRACTIONATED: 0.37 [IU]/mL (ref 0.30–0.70)

## 2014-08-18 MED ORDER — APIXABAN 5 MG PO TABS
5.0000 mg | ORAL_TABLET | Freq: Two times a day (BID) | ORAL | Status: DC
  Administered 2014-08-18: 5 mg via ORAL
  Filled 2014-08-18 (×2): qty 1

## 2014-08-18 MED ORDER — HYDROCODONE-ACETAMINOPHEN 5-325 MG PO TABS: 1.0000 | ORAL_TABLET | Freq: Four times a day (QID) | ORAL | Status: DC | PRN

## 2014-08-18 MED ORDER — APIXABAN 5 MG PO TABS: 5.0000 mg | ORAL_TABLET | Freq: Two times a day (BID) | ORAL | Status: DC

## 2014-08-18 NOTE — Progress Notes (Signed)
  [facility] Progress Note  08/18/2014 7:39 AM 2 Days Post-Op  Subjective:  Doing well. No complaints.   Tmax 98.8 BP sys 120s-140s HR 60s-80s 02 97% 2L  Filed Vitals:   08/18/14 0526  BP: 127/69  Pulse: 76  Temp: 98.3 F (36.8 C)  Resp: 18    Physical Exam: Incisions:  Right antecubital incision clean, dry and intact. Ecchymosis around incision.  Extremities:  5/5 right grip strength. 2+ right radial pulse. Sensation intact.  Cardiac: irregularly irregular  CBC    Component Value Date/Time   WBC 6.3 08/18/2014 0435   RBC 4.40 08/18/2014 0435   HGB 14.4 08/18/2014 0435   HCT 42.7 08/18/2014 0435   PLT 215 08/18/2014 0435   MCV 97.0 08/18/2014 0435   MCH 32.7 08/18/2014 0435   MCHC 33.7 08/18/2014 0435   RDW 13.4 08/18/2014 0435    BMET    Component Value Date/Time   NA 138 08/18/2014 0435   K 4.2 08/18/2014 0435   CL 101 08/18/2014 0435   CO2 25 08/18/2014 0435   GLUCOSE 105* 08/18/2014 0435   BUN 13 08/18/2014 0435   CREATININE 0.75 08/18/2014 0435   CALCIUM 9.9 08/18/2014 0435   GFRNONAA 81* 08/18/2014 0435   GFRAA >90 08/18/2014 0435    INR    Component Value Date/Time   INR 1.08 08/16/2014 0441     Intake/Output Summary (Last 24 hours) at 08/18/14 0739 Last data filed at 08/18/14 0700  Gross per 24 hour  Intake    360 ml  Output   2850 ml  Net  -2490 ml     Assessment:  73 y.o. female is s/p: Right brachial, ulnar, and radial artery embolectomy  2 Days Post-Op  Plan: -Hand is well perfused. 2+ right radial pulse. Motor/sensory intact. -Will restart Eliquis today. -D/C home. Dr. Trula Slade to see patient.    Virgina Jock, PA-C [facility] Office: [phone] Pager: [phone] 08/18/2014 7:39 AM

## 2014-08-18 NOTE — Discharge Instructions (Signed)

## 2014-08-18 NOTE — Discharge Summary (Signed)
[facility] Discharge Summary  [name] Jan 06, 1941 73 y.o. female  [id number]  Admission Date: 08/14/2014  Discharge Date: 08/18/2014  Physician: Serafina Mitchell, MD  Admission Diagnosis: Preop cardiovascular exam [N82.956] Decreased radial pulse [R09.89]   HPI:   This is a 73 y.o. female who presented for evaluation of right hand ischemia on 08/15/14. Pt had acute onset of pain near her right elbow followed by numbness and tingling in her right hand earlier this evening. She is usually on Eliquis for atrial fibrillation but stopped after a recent fall where she developed a black eye. Pt fell and sustained a bruise around her right eye 4 days ago. She then resumed her Eliquis earlier today but is a little unsure whether she took it Sunday morning and evening or one or the other. She has had afib for a long time but was switched from coumadin to Eliquis recently after she "saw the TV ad". The numbness and tingling in her hand has now resolved and her hand feels normal at this point. She was started on heparin at approximately 9 pm.Dr. Oneida Alar was called about the patient from [facility] at 830 pm and requested transfer to [facility]. She arrived in transfer at 1145pm. Other medical problems include diabetes and COPD which are controlled. She also has a left side pacemaker. Her cardiologist is Dr Caryl Comes. She denies any shortness of breath or chest pain today. Her troponin was normal.  Hospital Course:  On hospital day 1, she was started on a heparin drip. She denied any pain or numbness in her right arm or hand. She mainly complained of weakness. A right upper extremity duplex showed thrombus in the distal right brachial artery. A right arm embolectomy was planned for the next day.   The patient was taken to the operating room on 08/16/2014 and underwent: right brachial, ulnar and radial artery embolectomy. The patient tolerated the procedure  well and was transported to the PACU in good condition.   On POD 1, she had a palpable right radial pulse. She had a small hematoma around her incision with surrounding ecchymosis that was not expanding. This was expected shortly after embolectomy. She was continued on a heparin drip.  On POD 2, she was continuing to do well. She no longer had a hematoma of her right incision. She was restarted on Eliquis. Her surgical pathology was consistent with a thrombus. No tumor seen. She was discharged on POD 2 in good condition.     CBC    Component Value Date/Time   WBC 6.3 08/18/2014 0435   RBC 4.40 08/18/2014 0435   HGB 14.4 08/18/2014 0435   HCT 42.7 08/18/2014 0435   PLT 215 08/18/2014 0435   MCV 97.0 08/18/2014 0435   MCH 32.7 08/18/2014 0435   MCHC 33.7 08/18/2014 0435   RDW 13.4 08/18/2014 0435    BMET    Component Value Date/Time   NA 138 08/18/2014 0435   K 4.2 08/18/2014 0435   CL 101 08/18/2014 0435   CO2 25 08/18/2014 0435   GLUCOSE 105* 08/18/2014 0435   BUN 13 08/18/2014 0435   CREATININE 0.75 08/18/2014 0435   CALCIUM 9.9 08/18/2014 0435   GFRNONAA 81* 08/18/2014 0435   GFRAA >90 08/18/2014 0435     Discharge Instructions:   The patient is discharged to home with extensive instructions on wound care and progressive ambulation.  They are instructed not to drive or perform any heavy lifting until returning  to see the physician in his office.  Discharge Instructions    Call MD for:  redness, tenderness, or signs of infection (pain, swelling, bleeding, redness, odor or green/yellow discharge around incision site)    Complete by:  As directed      Call MD for:  severe or increased pain, loss or decreased feeling  in affected limb(s)    Complete by:  As directed      Call MD for:  temperature >100.5    Complete by:  As directed      Driving Restrictions    Complete by:  As directed   No driving while on pain medication     Lifting restrictions    Complete by:  As  directed   No lifting for 1 week     Nursing communication    Complete by:  As directed   Please wait for Dr. Trula Slade to see her before discharging.     Resume previous diet    Complete by:  As directed      may wash over wound with mild soap and water    Complete by:  As directed            Discharge Diagnosis:  Preop cardiovascular exam [D82.641] Decreased radial pulse [R09.89]  Secondary Diagnosis: Patient Active Problem List   Diagnosis Date Noted  . Ischemia of hand 08/15/2014   Past Medical History  Diagnosis Date  . Atrial fibrillation   . Diabetes mellitus without complication   . COPD (chronic obstructive pulmonary disease)   . Gout        Medication List    TAKE these medications        apixaban 5 MG Tabs tablet  Commonly known as:  ELIQUIS  Take 1 tablet (5 mg total) by mouth 2 (two) times daily.     HYDROcodone-acetaminophen 5-325 MG per tablet  Commonly known as:  NORCO  Take 1 tablet by mouth every 6 (six) hours as needed for moderate pain.     insulin glargine 100 UNIT/ML injection  Commonly known as:  LANTUS  Inject 30 Units into the skin every morning.     METOPROLOL TARTRATE PO  Take 1 tablet by mouth 2 (two) times daily.     multivitamin with minerals tablet  Take 1 tablet by mouth daily.     PRILOSEC OTC PO  Take 1 tablet by mouth every morning.     SULFASALAZINE PO  Take 1 tablet by mouth.       Norco #10 No Refill  Disposition: Home  Patient's condition: is Good  Follow up: 1. Dr. Trula Slade in 2 weeks   Virgina Jock, PA-C [facility] [phone] 08/18/2014  11:56 AM

## 2014-08-18 NOTE — Progress Notes (Signed)
Medicare Important Message given? YES  (If response is "NO", the following Medicare IM given date fields will be blank)  Date Medicare IM given: 08/18/14 Medicare IM given by:  Dahlia Client [employer]

## 2014-08-18 NOTE — Progress Notes (Signed)
Pt discharged home Discharge instructions given & reviewed Eduction discussed  IV dc'd  Tele dc'd  Pt discharged via wheelchair with student nurse.  All pt belongs at side.  Kathleen Argue S 1:19 PM

## 2014-08-18 NOTE — Telephone Encounter (Addendum)
-----   Message from Mena Goes, RN sent at 08/18/2014  1:37 PM EST ----- Regarding: Schedule   ----- Message -----    From: Alvia Grove, PA-C    Sent: 08/18/2014  11:09 AM      To: [facility] Charge Pool  S/p right brachial embolectomy 08/16/14  F/u with Dr. Trula Slade in 2 weeks.  Thanks Kim  notified patient of post op appt. on 09-05-14 1pm with dr. Trula Slade

## 2014-08-31 ENCOUNTER — Other Ambulatory Visit: Payer: Self-pay | Admitting: *Deleted

## 2014-08-31 DIAGNOSIS — Z48812 Encounter for surgical aftercare following surgery on the circulatory system: Secondary | ICD-10-CM

## 2014-08-31 DIAGNOSIS — I739 Peripheral vascular disease, unspecified: Secondary | ICD-10-CM

## 2014-09-05 ENCOUNTER — Encounter: Payer: Self-pay | Admitting: Surgery

## 2014-09-05 ENCOUNTER — Ambulatory Visit (INDEPENDENT_AMBULATORY_CARE_PROVIDER_SITE_OTHER): Payer: Medicare Other | Admitting: Surgery

## 2014-09-05 ENCOUNTER — Encounter (HOSPITAL_COMMUNITY): Payer: Medicare Other

## 2014-09-05 VITALS — BP 137/83 | HR 77 | Ht 70.0 in | Wt 283.0 lb

## 2014-09-05 DIAGNOSIS — I742 Embolism and thrombosis of arteries of the upper extremities: Secondary | ICD-10-CM

## 2014-09-05 NOTE — Progress Notes (Signed)
  [facility]  Post-Operative Follow up  CC:  2 week f/u   HPI: This is a 73 y.o. female who is s/p right brachial, ulnar and radial artery embolectomy on 08/16/2014. She initially presented to the hospital with acute onset of pain near her right elbow with numbness and tingling of her right hand. She was on Eliquis for atrial fibrillation but stopped it after sustaining a fall and black eye.   Today, she denies any weakness, numbness or pain of her right hand or arm. She is compliant with her Eliquis.    PHYSICAL EXAMINATION:  Filed Vitals:   09/05/14 1402  BP: 137/83  Pulse: 77   Body mass index is 40.61 kg/(m^2).  General: WD obese female in NAD Vascular: Right antecubital incision healing well. 2+ right radial pulse. 5/5 right grip strength. Sensation intact.   ASSESSMENT: 73 y.o. female who is s/p right brachial, ulnar and radial artery embolectomy 08/16/14  PLAN: The patient is doing well post-operatively and has denied any further pain, numbness or weakness with her right upper extremity. Her incision is healing well. She is currently compliant with her Eliquis. She was advised to contact the office if she develops any symptoms of acute ischemia. She will follow up on an as needed basis.    Virgina Jock, PA-C [facility] [phone]  Clinic MD:  Pt seen and examined in conjunction with Dr. Trula Slade   I agree with the above.  I have seen and examined the patient.  She has a palpable radial pulse.  We discussed the signs and symptoms of ischemia.  She will contact me if this occurs.  Annamarie Major

## 2014-09-14 ENCOUNTER — Other Ambulatory Visit: Payer: Self-pay | Admitting: Family Medicine

## 2014-09-14 ENCOUNTER — Ambulatory Visit
Admission: RE | Admit: 2014-09-14 | Discharge: 2014-09-14 | Disposition: A | Discharge status: Home / Self Care | Payer: Medicare Other | Source: Ambulatory Visit | Attending: Family Medicine | Admitting: Family Medicine

## 2014-09-14 DIAGNOSIS — M5489 Other dorsalgia: Secondary | ICD-10-CM

## 2014-09-26 ENCOUNTER — Other Ambulatory Visit: Payer: Self-pay | Admitting: Internal Medicine

## 2014-10-03 ENCOUNTER — Ambulatory Visit: Payer: Medicare Other | Attending: Family Medicine | Admitting: Physical Therapy

## 2014-10-03 DIAGNOSIS — M5136 Other intervertebral disc degeneration, lumbar region: Secondary | ICD-10-CM | POA: Insufficient documentation

## 2014-10-03 DIAGNOSIS — M545 Low back pain: Secondary | ICD-10-CM | POA: Diagnosis not present

## 2014-10-04 ENCOUNTER — Encounter: Payer: Self-pay | Admitting: Internal Medicine

## 2014-10-04 ENCOUNTER — Ambulatory Visit (INDEPENDENT_AMBULATORY_CARE_PROVIDER_SITE_OTHER): Payer: Medicare Other | Admitting: Internal Medicine

## 2014-10-04 VITALS — BP 110/80 | HR 89 | Ht 70.0 in | Wt 286.4 lb

## 2014-10-04 DIAGNOSIS — Z45018 Encounter for adjustment and management of other part of cardiac pacemaker: Secondary | ICD-10-CM

## 2014-10-04 DIAGNOSIS — I495 Sick sinus syndrome: Secondary | ICD-10-CM

## 2014-10-04 DIAGNOSIS — I482 Chronic atrial fibrillation, unspecified: Secondary | ICD-10-CM

## 2014-10-04 DIAGNOSIS — I5032 Chronic diastolic (congestive) heart failure: Secondary | ICD-10-CM

## 2014-10-04 LAB — MDC_IDC_ENUM_SESS_TYPE_INCLINIC
Battery Impedance: 299 Ohm
Battery Remaining Longevity: 133 mo
Battery Voltage: 2.8 V
Brady Statistic RV Percent Paced: 28 %
Lead Channel Impedance Value: 627 Ohm
Lead Channel Impedance Value: 67 Ohm
Lead Channel Pacing Threshold Pulse Width: 0.4 ms
Lead Channel Sensing Intrinsic Amplitude: 15.67 mV
Lead Channel Setting Pacing Amplitude: 2.5 V
Lead Channel Setting Pacing Pulse Width: 0.4 ms
Lead Channel Setting Sensing Sensitivity: 5.6 mV
MDC IDC MSMT LEADCHNL RV PACING THRESHOLD AMPLITUDE: 0.75 V
MDC IDC SESS DTM: 20160105171843

## 2014-10-04 NOTE — Progress Notes (Signed)
Patient Care Team: Dibas Koirala, MD as PCP - General (Family Medicine) Dibas Koirala, MD (Family Medicine) Dibas Koirala, MD (Family Medicine)   HPI  [name] is a 74 y.o. female seen in followup for pacemaker implanted for tachybradycardia syndrome as well as permanent atrial fibrillation with a somewhat rapid ventricular response.    she is status post Pulm Vein isolation at [place] Summer 2009.   She also has hypertension, obesity as well as sleep apnea   Right and left heart catheterization demonstrating stable stenosis of the proximal LAD and interval worsening of a diagonal branch. She saw Dr. Burt Knack 10/13 to discuss PCI and it was elected because of the complex anatomy to pursue medical therapy.   She has had variable assessment of LV function.  Echo  9/13 EF 30-35%. Global 4 chamber hypokinesis last month 9/15 EF 45-50%   LAD (59/2.3)  She is on anticoagulation so antiplatelet therapy was not initiated   She took a fall and ended up hitting her head. On her own she stopped her apixaban. She ended up with arterial embolism to her right arm requiring thrombectomy.  She continues to  Struggle with DOE; she takes her diuretics only infrequently. She  denies chest discomfort   In the past efforts to pursue physical therapy were not well rewarded;  she is starting again.   She also has difficulty with prolonged standing feeling like her "legs are going to buckle underneath her"     Past Medical History  Diagnosis Date  . Nonischemic cardiomyopathy     history of,  EF 20-25% at left heart cath in 06/2007; echo 2011 had normal LV function  . Chronic diastolic heart failure     Echo 06/2010: Moderate LVH, EF 55-65%, normal wall motion, mild MR, moderate to severe LAE, mild RAE.   . Morbid obesity   . Hyperlipidemia   . Tachy-brady syndrome     status post implant of a medtronic dual-chamber pacemaker in 2001.  explanted in 2010 -- Irion pacemaker in 2010.  Marland Kitchen Chronic obstructive pulmonary disease   . Osteoporosis   . Depression   . Obstructive sleep apnea     continuous positive airway pressure  . GERD (gastroesophageal reflux disease)   . Crohn's disease   . Hypothyroidism     treated  . Seasonal allergies   . Pacemaker     Medtronic  . Atrial fibrillation     Status post pulmonary vein isolation 2009 at [facility]  . CAD (coronary artery disease)     LHC 05/2007: pLAD 70-80%, pRCA 40%, EF 20-25%. LAD lesion treated medically.   . Coronary artery disease 06/15/2012  . Atrial fibrillation 12/18/2007    Annotation: refractory Qualifier: Diagnosis of  By: Doy Mince LPN, Megan    . Morbid obesity with BMI of 40.0-44.9, adult 06/22/2012  . Diabetes mellitus 06/22/2012  . Atrial fibrillation   . Diabetes mellitus without complication   . COPD (chronic obstructive pulmonary disease)   . Gout     Past Surgical History  Procedure Laterality Date  . Pulmonary vein isolation  05/12/2008    RFCA atrial fibrillation  . Post ablation pseudoaneurysm      at A fib ablation  . Cardiac catheterization  08/2000    noncritical disease mid RCA, EF preserved  . Cardiac catheterization  05/29/2007    noncritical disease, EF 20-25%  . Pacemaker insertion  2010    medtronic  ADAPTA   . Tonsillectomy    . Embolectomy Right 08/16/2014    Procedure: EMBOLECTOMY- RIGHT BRACHIAL ARTERY;  Surgeon: Serafina Mitchell, MD;  Location: [facility] OR;  Service: Vascular;  Laterality: Right;    Current Outpatient Prescriptions  Medication Sig Dispense Refill  . apixaban (ELIQUIS) 5 MG TABS tablet Take 1 tablet (5 mg total) by mouth 2 (two) times daily. 180 tablet 3  . atorvastatin (LIPITOR) 80 MG tablet Take 80 mg by mouth at bedtime.      . B-D INS SYRINGE 0.5CC/31GX5/16 31G X 5/16" 0.5 ML MISC as directed.    Marland Kitchen BONIVA 150 MG tablet Take 1 tablet by mouth Every month.    . Fiber CAPS Take by mouth daily. 5/6 caps daily    .  Fluticasone-Salmeterol (ADVAIR DISKUS) 500-50 MCG/DOSE AEPB Inhale 1 puff into the lungs every 12 (twelve) hours.      Marland Kitchen FREESTYLE LITE test strip as directed.    . furosemide (LASIX) 40 MG tablet TAKE 1 TABLET BY MOUTH EVERY MORNING AND TAKE 2 TABLETS EVERY EVENING 270 tablet 1  . HYDROcodone-acetaminophen (NORCO) 5-325 MG per tablet Take 1 tablet by mouth every 6 (six) hours as needed for moderate pain. 10 tablet 0  . insulin glargine (LANTUS) 100 UNIT/ML injection Inject 30 Units into the skin every morning.     . Lancets (FREESTYLE) lancets as directed.    Marland Kitchen levothyroxine (SYNTHROID, LEVOTHROID) 25 MCG tablet Take 25 mcg by mouth daily.      Marland Kitchen loratadine (CLARITIN) 10 MG tablet Take 10 mg by mouth as needed.     Marland Kitchen losartan (COZAAR) 25 MG tablet Take 25 mg by mouth at bedtime.      Marland Kitchen METOPROLOL TARTRATE PO Take 1 tablet by mouth 2 (two) times daily.    . Multiple Vitamins-Minerals (MULTIVITAMIN WITH MINERALS) tablet Take 1 tablet by mouth daily.    . nitroGLYCERIN (NITROSTAT) 0.4 MG SL tablet Place 1 tablet (0.4 mg total) under the tongue every 5 (five) minutes as needed for chest pain. 25 tablet 1  . omeprazole (PRILOSEC OTC) 20 MG tablet Take 20 mg by mouth daily.      . potassium chloride SA (KLOR-CON M20) 20 MEQ tablet Take 1 tablet (20 mEq total) by mouth 2 (two) times daily. 180 tablet 3  . ranolazine (RANEXA) 500 MG 12 hr tablet Take 1 tablet (500 mg total) by mouth 2 (two) times daily. 180 tablet 1  . sulfaSALAzine (AZULFIDINE) 500 MG tablet Take 2,000 mg by mouth 2 (two) times daily.     . [DISCONTINUED] allopurinol (ZYLOPRIM) 100 MG tablet Take 100 mg by mouth daily.       No current facility-administered medications for this visit.    No Known Allergies  Review of Systems negative except from HPI and PMH  Physical Exam BP 110/80 mmHg  Pulse 89  Ht 5\' 10"  (1.778 m)  Wt 286 lb 6.4 oz (129.91 kg)  BMI 41.09 kg/m2 Well developed and well nourished in no acute distress HENT  normal E scleral and icterus clear Neck Supple JVP flat; carotids brisk and full Clear to ausculation Irregular rate and rhythm, no murmurs gallops or rub Soft with active bowel sounds No clubbing cyanosis Trace Edema Alert and oriented, grossly normal motor and sensory function Skin Warm and Dry  ECG demonstrates atrial fibrillation rate of 85 Intervals/10/40  with right axis deviation  Ambulatory oxygen saturation testing today in the office demonstrated a saturation of 93%  Assessment and  Plan  HFpEF/HFrEF  Atrial fibrillation-permanent Pacemaker The patient's device was interrogated.  The information was reviewed. No changes were made in the programming.     Dizziness with standing   CAD  Her exercise intolerance continues to worsen. I wonder whether there is a component of a milliequivalents manifesting as dyspnea. Not clearly better with ranolzainso her dyspnea on exertion is likely related to rapid ventricular rates. Pursue augmented rate control using a calcium blocker iand stopping her ranolazine

## 2014-10-04 NOTE — Patient Instructions (Signed)

## 2014-10-05 ENCOUNTER — Ambulatory Visit: Payer: Medicare Other | Admitting: Physical Therapy

## 2014-10-05 DIAGNOSIS — M545 Low back pain: Secondary | ICD-10-CM | POA: Diagnosis not present

## 2014-10-12 ENCOUNTER — Encounter: Payer: Self-pay | Admitting: Physical Therapy

## 2014-10-17 ENCOUNTER — Ambulatory Visit: Payer: Medicare Other | Admitting: Physical Therapy

## 2014-10-19 ENCOUNTER — Ambulatory Visit: Payer: Medicare Other | Admitting: Physical Therapy

## 2014-10-19 DIAGNOSIS — M545 Low back pain: Secondary | ICD-10-CM | POA: Diagnosis not present

## 2014-10-26 ENCOUNTER — Ambulatory Visit: Payer: Medicare Other | Admitting: Physical Therapy

## 2014-10-26 DIAGNOSIS — M545 Low back pain: Secondary | ICD-10-CM | POA: Diagnosis not present

## 2014-11-02 ENCOUNTER — Ambulatory Visit: Payer: Medicare Other | Admitting: Physical Therapy

## 2014-11-09 ENCOUNTER — Ambulatory Visit: Payer: Medicare Other | Attending: Family Medicine | Admitting: Physical Therapy

## 2014-11-09 DIAGNOSIS — M5136 Other intervertebral disc degeneration, lumbar region: Secondary | ICD-10-CM | POA: Diagnosis not present

## 2014-11-09 DIAGNOSIS — M545 Low back pain: Secondary | ICD-10-CM | POA: Insufficient documentation

## 2014-11-10 DIAGNOSIS — E669 Obesity, unspecified: Secondary | ICD-10-CM | POA: Insufficient documentation

## 2014-11-15 ENCOUNTER — Other Ambulatory Visit (HOSPITAL_COMMUNITY): Payer: Self-pay | Admitting: Internal Medicine

## 2014-11-15 DIAGNOSIS — E213 Hyperparathyroidism, unspecified: Secondary | ICD-10-CM

## 2014-11-21 ENCOUNTER — Encounter (HOSPITAL_COMMUNITY): Payer: Medicare Other

## 2014-11-21 ENCOUNTER — Encounter (HOSPITAL_COMMUNITY)
Admission: RE | Admit: 2014-11-21 | Discharge: 2014-11-21 | Disposition: A | Discharge status: Home / Self Care | Payer: Medicare Other | Source: Ambulatory Visit | Attending: Internal Medicine | Admitting: Internal Medicine

## 2014-11-21 DIAGNOSIS — E213 Hyperparathyroidism, unspecified: Secondary | ICD-10-CM | POA: Diagnosis not present

## 2014-11-21 MED ORDER — TECHNETIUM TC 99M SESTAMIBI - CARDIOLITE
25.0000 | Freq: Once | INTRAVENOUS | Status: AC | PRN
  Administered 2014-11-21: 09:00:00 25 via INTRAVENOUS

## 2014-11-23 ENCOUNTER — Ambulatory Visit: Payer: Medicare Other | Admitting: Physical Therapy

## 2014-11-29 ENCOUNTER — Other Ambulatory Visit (INDEPENDENT_AMBULATORY_CARE_PROVIDER_SITE_OTHER): Payer: Self-pay | Admitting: Surgery

## 2014-11-29 DIAGNOSIS — D351 Benign neoplasm of parathyroid gland: Secondary | ICD-10-CM

## 2014-11-29 NOTE — Addendum Note (Signed)
Addended by: Earnstine Regal on: 11/29/2014 08:09 PM   Modules accepted: Orders

## 2014-11-30 ENCOUNTER — Other Ambulatory Visit (INDEPENDENT_AMBULATORY_CARE_PROVIDER_SITE_OTHER): Payer: Self-pay

## 2014-11-30 DIAGNOSIS — D351 Benign neoplasm of parathyroid gland: Secondary | ICD-10-CM

## 2014-12-02 ENCOUNTER — Other Ambulatory Visit: Payer: Self-pay

## 2014-12-02 ENCOUNTER — Ambulatory Visit
Admission: RE | Admit: 2014-12-02 | Discharge: 2014-12-02 | Disposition: A | Discharge status: Home / Self Care | Payer: Medicare Other | Source: Ambulatory Visit | Attending: Surgery | Admitting: Surgery

## 2014-12-02 ENCOUNTER — Other Ambulatory Visit: Payer: Self-pay | Admitting: Surgery

## 2014-12-02 DIAGNOSIS — D351 Benign neoplasm of parathyroid gland: Secondary | ICD-10-CM

## 2014-12-06 ENCOUNTER — Ambulatory Visit: Payer: Medicare Other

## 2014-12-14 ENCOUNTER — Other Ambulatory Visit: Payer: Self-pay | Admitting: Surgery

## 2014-12-14 DIAGNOSIS — E041 Nontoxic single thyroid nodule: Secondary | ICD-10-CM

## 2014-12-15 ENCOUNTER — Other Ambulatory Visit: Payer: Self-pay | Admitting: Surgery

## 2014-12-15 ENCOUNTER — Other Ambulatory Visit (INDEPENDENT_AMBULATORY_CARE_PROVIDER_SITE_OTHER): Payer: Self-pay | Admitting: *Deleted

## 2014-12-15 DIAGNOSIS — E041 Nontoxic single thyroid nodule: Secondary | ICD-10-CM

## 2014-12-16 ENCOUNTER — Telehealth: Payer: Self-pay | Admitting: Internal Medicine

## 2014-12-16 NOTE — Telephone Encounter (Signed)
Returned call from [place] at [facility] [facility]. Patient having a procedure (thyroid biopsy) and will need to stop blood thinner for 48 hours pre-procedure. They are calling for order/permission to hold Eliquis.  Informed them that Dr. Caryl Comes is not in clinic this afternoon and this will need to be addressed early next week. She stated "that is fine". Routed to Dr. Caryl Comes.

## 2014-12-16 NOTE — Telephone Encounter (Signed)
New Message      The patient needs permission to stop Eliquois. Please fax a note Advance @ [phone]. The number to the office is [phone] and you can speak with Olin Hauser.

## 2014-12-16 NOTE — Telephone Encounter (Signed)
New Msg      Pt having thyroid biopsy and will need to stop blood thinner for 48rs.   Unable to get more info due to call dropping.   Please return call.

## 2014-12-19 NOTE — Telephone Encounter (Signed)
OK 

## 2014-12-20 ENCOUNTER — Telehealth: Payer: Self-pay | Admitting: Internal Medicine

## 2014-12-20 ENCOUNTER — Telehealth: Payer: Self-pay | Admitting: *Deleted

## 2014-12-20 NOTE — Telephone Encounter (Signed)
Attempted to call patient to inform her about Holding Eliquis 48 hours prior to her upcoming biopsy and restarting it the day after her procedure. She did not answer the phone. LMTCB

## 2014-12-20 NOTE — Telephone Encounter (Signed)
I spoke with the pt and made her aware she can hold Eliquis 48 hours prior to biopsy and restart it the day after procedure per documentation from [facility], RN at 12/20/2014 8:33 AM     Status: Signed       Expand All Collapse All   Attempted to call patient to inform her about Holding Eliquis 48 hours prior to her upcoming biopsy and restarting it the day after her procedure. She did not answer the phone. LMTCB

## 2014-12-20 NOTE — Telephone Encounter (Signed)
New message ° ° ° ° ° °Returning [name]'s call °

## 2014-12-20 NOTE — Telephone Encounter (Signed)
Left message for Pamela[facility] regarding HOLD Eliquis 48 hours prior to biopsy procedure and restart Eliquis the day after procedure - per direction from Dr. Caryl Comes. Note faxed to Olin Hauser at [place].

## 2014-12-20 NOTE — Telephone Encounter (Signed)
F/U        [name] with [facility] calling, states she needs something written instead of verbal.   Please Fax to [phone].    If any questions please call [phone].

## 2014-12-20 NOTE — Telephone Encounter (Signed)
Spoke with Olin Hauser from [facility]. She did receive faxed note with Eliquis HOLD instructions and ok from Dr. Caryl Comes. Informed her that I did try to call patient to provide her with the instructions but had to leave a VM. Olin Hauser states she will be calling the patient to give her the instructions today.

## 2014-12-22 ENCOUNTER — Institutional Professional Consult (permissible substitution): Payer: Self-pay | Admitting: Pulmonary Disease

## 2014-12-23 ENCOUNTER — Other Ambulatory Visit: Payer: Self-pay | Admitting: Cardiovascular Disease

## 2014-12-27 ENCOUNTER — Other Ambulatory Visit: Payer: Self-pay

## 2014-12-29 ENCOUNTER — Ambulatory Visit
Admission: RE | Admit: 2014-12-29 | Discharge: 2014-12-29 | Disposition: A | Discharge status: Home / Self Care | Payer: Medicare Other | Source: Ambulatory Visit | Attending: Surgery | Admitting: Surgery

## 2014-12-29 ENCOUNTER — Other Ambulatory Visit (HOSPITAL_COMMUNITY)
Admission: RE | Admit: 2014-12-29 | Discharge: 2014-12-29 | Disposition: A | Discharge status: Home / Self Care | Payer: Medicare Other | Source: Ambulatory Visit | Attending: Interventional Radiology | Admitting: Interventional Radiology

## 2014-12-29 DIAGNOSIS — E041 Nontoxic single thyroid nodule: Secondary | ICD-10-CM | POA: Diagnosis present

## 2015-01-01 ENCOUNTER — Other Ambulatory Visit: Payer: Self-pay | Admitting: Internal Medicine

## 2015-01-03 ENCOUNTER — Telehealth: Payer: Self-pay | Admitting: Cardiology

## 2015-01-03 ENCOUNTER — Encounter: Payer: Medicare Other | Admitting: *Deleted

## 2015-01-03 NOTE — Telephone Encounter (Signed)
LMOVM reminding pt to send remote transmission.   

## 2015-01-04 ENCOUNTER — Encounter: Payer: Self-pay | Admitting: Cardiology

## 2015-01-04 ENCOUNTER — Ambulatory Visit: Payer: Self-pay | Admitting: Surgery

## 2015-01-05 NOTE — Telephone Encounter (Signed)
Spoke to [facility] to verify that patient had already had biopsy and nothing further was needed from the [place] office. Completed.

## 2015-01-17 ENCOUNTER — Encounter: Payer: Self-pay | Admitting: Pulmonary Disease

## 2015-01-17 ENCOUNTER — Ambulatory Visit (INDEPENDENT_AMBULATORY_CARE_PROVIDER_SITE_OTHER): Payer: Medicare Other | Admitting: Pulmonary Disease

## 2015-01-17 VITALS — BP 122/62 | HR 75 | Temp 97.4°F | Ht 70.0 in

## 2015-01-17 DIAGNOSIS — J438 Other emphysema: Secondary | ICD-10-CM | POA: Diagnosis not present

## 2015-01-17 MED ORDER — FLUTICASONE-SALMETEROL 250-50 MCG/DOSE IN AEPB: 1.0000 | INHALATION_SPRAY | Freq: Two times a day (BID) | RESPIRATORY_TRACT | Status: DC

## 2015-01-17 NOTE — Addendum Note (Signed)
Addended by: Doroteo Glassman D on: 01/17/2015 12:54 PM   Modules accepted: Orders, Medications

## 2015-01-17 NOTE — Assessment & Plan Note (Signed)
The patient has a history of moderate COPD, and has not been seen in many years. She is currently on Advair alone and high-dose, and has very severe dyspnea on exertion that impacts her quality of life. Her shortness of breath is multifactorial, and includes her underlying lung disease, her underlying heart disease, as well as weight and conditioning. She is also on a very high dose of beta blocker given her underlying airflow obstruction, and I wonder if this may be contributing to some degree. I will send a note to her primary physician and cardiologist regarding this. At this point, I would like to intensify her bronchodilator regimen by adding an anti-cholinergic, and will also decrease her Advair to the lower strength since the higher strength has never been shown more effective in COPD. I have also stressed to her the importance of improving her conditioning and weight loss, and have recommended referral to pulmonary rehabilitation. She will consider this. I have discussed checking ambulatory oximetry today, but she wishes to hold off because of significant weakness in her lower extremities.

## 2015-01-17 NOTE — Patient Instructions (Signed)
Decrease your advair to the 250 strength.  Take one inhalation am and pm, and rinse mouth out well.  We can call in prescription for this to hold Start on spiriva respimat, 2 inhalations each am everyday.  If this helps you, call and we can send in prescription Will send referral [facility]. You are on a high dose of lopressor for someone that has lung disease.  It may or may not contribute to your breathing.  Will send note to your primary and cardiologist. followup again in 45mos to check on things.

## 2015-01-17 NOTE — Progress Notes (Signed)
   Subjective:    Patient ID: [name], female    DOB: Feb 20, 1941, 74 y.o.   MRN: [id number]  HPI The patient is a 74 year old female who I've been asked to see for management of COPD. I have seen her many years ago for this, but she did not keep her follow-up appointments. She had PFTs in 2013 which showed moderate airflow obstruction, no restriction, and a mild decrease in her diffusion capacity. She has maintained on high-dose Advair, but stopped taking her Spiriva for unknown reasons. She currently has significant dyspnea on exertion, and tells me just walking 10 steps through her house will get her winded. She feels this is getting worse. Surprisingly, she has had no recent acute exacerbation, and does not overuse her rescue inhaler. She has some chronic cough with intermittent clear mucus, and also has chronic lower extremity edema related to her ongoing cardiac issues. She is on a fairly high dose of Lopressor at this time. She tells me that her weight has been neutral over the last 2 years, but she leads a very sedentary lifestyle. She had an x-ray in November of last year that showed no acute process.   Review of Systems  Constitutional: Negative for fever and unexpected weight change.  HENT: Negative for congestion, dental problem, ear pain, nosebleeds, postnasal drip, rhinorrhea, sinus pressure, sneezing, sore throat and trouble swallowing.   Eyes: Negative for redness and itching.  Respiratory: Positive for cough, shortness of breath and wheezing. Negative for chest tightness.   Cardiovascular: Positive for leg swelling. Negative for palpitations.  Gastrointestinal: Negative for nausea and vomiting.  Genitourinary: Negative for dysuria.  Musculoskeletal: Negative for joint swelling.  Skin: Negative for rash.  Neurological: Positive for headaches.  Hematological: Does not bruise/bleed easily.  Psychiatric/Behavioral: Negative for dysphoric mood. The patient is not nervous/anxious.         Objective:   Physical Exam Constitutional:  Obese female, no acute distress  HENT:  Nares patent without discharge  Oropharynx without exudate, palate and uvula are elongated  Eyes:  Perrla, eomi, no scleral icterus  Neck:  No JVD, no TMG  Cardiovascular:  Normal rate, regular rhythm, no rubs or gallops.  No murmurs        Intact distal pulses but decreased  Pulmonary :  decreased breath sounds, no stridor or respiratory distress   No rales, rhonchi, or wheezing  Abdominal:  Soft, nondistended, bowel sounds present.  No tenderness noted.   Musculoskeletal:  1+  lower extremity edema noted.  Lymph Nodes:  No cervical lymphadenopathy noted  Skin:  No cyanosis noted  Neurologic:  Alert, appropriate, moves all 4 extremities without obvious deficit.         Assessment & Plan:

## 2015-01-20 ENCOUNTER — Telehealth (HOSPITAL_COMMUNITY): Payer: Self-pay

## 2015-01-20 NOTE — Telephone Encounter (Signed)
I have called and left a message with Brya to inquire about participation in Pulmonary Rehab per Dr. Janifer Adie referral. Will follow up.

## 2015-01-27 ENCOUNTER — Encounter (HOSPITAL_COMMUNITY): Payer: Self-pay

## 2015-01-27 ENCOUNTER — Telehealth: Payer: Self-pay | Admitting: Internal Medicine

## 2015-01-27 ENCOUNTER — Encounter (HOSPITAL_COMMUNITY)
Admission: RE | Admit: 2015-01-27 | Discharge: 2015-01-27 | Disposition: A | Discharge status: Home / Self Care | Payer: Medicare Other | Source: Ambulatory Visit | Attending: Surgery | Admitting: Surgery

## 2015-01-27 DIAGNOSIS — Z87891 Personal history of nicotine dependence: Secondary | ICD-10-CM | POA: Diagnosis not present

## 2015-01-27 DIAGNOSIS — E780 Pure hypercholesterolemia: Secondary | ICD-10-CM | POA: Diagnosis not present

## 2015-01-27 DIAGNOSIS — E210 Primary hyperparathyroidism: Secondary | ICD-10-CM | POA: Diagnosis present

## 2015-01-27 DIAGNOSIS — J449 Chronic obstructive pulmonary disease, unspecified: Secondary | ICD-10-CM | POA: Diagnosis not present

## 2015-01-27 DIAGNOSIS — I2510 Atherosclerotic heart disease of native coronary artery without angina pectoris: Secondary | ICD-10-CM | POA: Diagnosis not present

## 2015-01-27 DIAGNOSIS — D351 Benign neoplasm of parathyroid gland: Secondary | ICD-10-CM | POA: Diagnosis not present

## 2015-01-27 DIAGNOSIS — E119 Type 2 diabetes mellitus without complications: Secondary | ICD-10-CM | POA: Diagnosis not present

## 2015-01-27 DIAGNOSIS — K5090 Crohn's disease, unspecified, without complications: Secondary | ICD-10-CM | POA: Diagnosis not present

## 2015-01-27 DIAGNOSIS — E039 Hypothyroidism, unspecified: Secondary | ICD-10-CM | POA: Diagnosis not present

## 2015-01-27 HISTORY — DX: Cardiac arrhythmia, unspecified: I49.9

## 2015-01-27 HISTORY — DX: Reserved for inherently not codable concepts without codable children: IMO0001

## 2015-01-27 HISTORY — DX: Peripheral vascular disease, unspecified: I73.9

## 2015-01-27 LAB — BASIC METABOLIC PANEL
Anion gap: 7 (ref 5–15)
BUN: 19 mg/dL (ref 6–23)
CALCIUM: 10.4 mg/dL (ref 8.4–10.5)
CO2: 24 mmol/L (ref 19–32)
Chloride: 108 mmol/L (ref 96–112)
Creatinine, Ser: 1.22 mg/dL — ABNORMAL HIGH (ref 0.50–1.10)
GFR calc Af Amer: 49 mL/min — ABNORMAL LOW (ref >90)
GFR, EST NON AFRICAN AMERICAN: 43 mL/min — AB (ref >90)
GLUCOSE: 125 mg/dL — AB (ref 70–99)
Potassium: 4.6 mmol/L (ref 3.5–5.1)
Sodium: 139 mmol/L (ref 135–145)

## 2015-01-27 LAB — CBC
HCT: 48.2 % — ABNORMAL HIGH (ref 36.0–46.0)
Hemoglobin: 15.6 g/dL — ABNORMAL HIGH (ref 12.0–15.0)
MCH: 31.7 pg (ref 26.0–34.0)
MCHC: 32.4 g/dL (ref 30.0–36.0)
MCV: 98 fL (ref 78.0–100.0)
Platelets: 277 10*3/uL (ref 150–400)
RBC: 4.92 MIL/uL (ref 3.87–5.11)
RDW: 14 % (ref 11.5–15.5)
WBC: 8.6 10*3/uL (ref 4.0–10.5)

## 2015-01-27 NOTE — Progress Notes (Signed)
Quick Note:  These results are acceptable for scheduled surgery.  Cyndie Woodbeck M. Trella Thurmond, MD, FACS [facility] Office: [phone]   ______ 

## 2015-01-27 NOTE — Progress Notes (Addendum)
Anesthesia Chart Review:  Pt is 74 year old female scheduled for parathyroidectomy on 01/30/2015 with Dr. Harlow Asa.   PCP is Dr. [name]. Cardiologist is Dr. Caryl Comes, last office visit 10/04/14. Pulmonologist is Dr. Gwenette Greet, last office visit 01/17/15.  PMH includes: nonischemic cardiomyopathy, chronic diastolic HF, tachy-brady syndrome, pacemaker (Medtronic, atrial fibrillation, CAD, DM, COPD, PVD, OSA, hypothyroidism, GERD, Crohn's disease. Former smoker. BMI 40.5. S/p embolectomy R brachial artery 08/16/14.   Medications include: Eliquis, lasix, metoprolol, losartan, ranexa. Pt to stop eliquis 48 hours prior to surgery.  Chest x-ray 08/15/2014 reviewed. Sequential pacemaker enters from the left with 2 leads in the region of the right ventricle and 1 lead in the region of the right atrium. Cardiomegaly. Pulmonary vascular prominence most notable centrally.   EKG 01/27/2015: Atrial fibrillation with premature ventricular or aberrantly conducted complexes.Low voltage QRS.Septal infarct, age undetermined.Lateral infarct, age undetermined. ST & T wave abnormality, consider inferior ischemia.  Echo 06/20/2014: - Left ventricle: The cavity size was normal. Wall thickness was increased in a pattern of mild LVH. There was moderate focal basal hypertrophy of the septum. Systolic function was mildly reduced. The estimated ejection fraction was in the range of 45% to 50%. Wall motion was normal; there were no regional wall motion abnormalities. - Mitral valve: Moderately calcified annulus. - Left atrium: The atrium was severely dilated. - Right ventricle: The cavity size was mildly dilated. Wall thickness was normal. Systolic function was mildly reduced. - Right atrium: The atrium was moderately dilated. - Pulmonary arteries: Systolic pressure was moderately increased. PA peak pressure: 50 mm Hg (S).  Cardiac cath 05/29/2007: Coronaries of the left main was normal. The LAD had proximal long 70-80%  stenosis before a large diagonal. The first diagonal was large andbranching with diffuse luminal irregularities. Circumflex in the AVgroove had diffuse luminal irregularities. There was a mid obtusemarginal which was large and branching with diffuse luminalirregularities. The right coronary artery was a large dominant vesselwith a long proximal 40% stenosis. There were diffuse luminalirregularities. PDA was large and normal. Posterolateral was large andnormal. Left ventriculogram was 20-25% with global hypokinesis. CONCLUSION: Severe global left ventricular dysfunction out of theportion to the degree of coronary disease. There is an LAD lesion thatdoes not appear to be obstructive, but could be evaluated with aCardiolite. However, this would not explain her global left ventriculardysfunction. She has elevated right-sided heart pressures and some mildevidence of restrictive physiology. PLAN: I have discussed this with Dr. Caryl Comes. She is going to need definitive therapy for her atrial fibrillation with rapid rate. She most likely would benefit from biventricular pacing symptomatically.  Per Dr. Olin Pia notes "Right and left heart catheterization demonstrating stable stenosis of the proximal LAD and interval worsening of a diagonal branch. She saw Dr. Burt Knack 10/13 to discuss PCI and it was elected because of the complex anatomy to pursue medical therapy."  Perioperative prescription for ICD programming notes procedure may interfere with device function. Magnet should be placed over device during procedure. Post-op interrogation is not needed.   Discussed case with Dr. Linna Caprice. Pt will need evaluation by her assigned anesthesiologist DOS.   If no changes, I anticipate pt can proceed with surgery as scheduled.   Willeen Cass, FNP-BC [facility]/Anesthesiology Phone: [phone] 01/27/2015 4:23 PM

## 2015-01-27 NOTE — Telephone Encounter (Signed)
[name] from pre admission is calling stating she is for surgery Mon.- Parathyroidectomy and no has notified her regarding stopping the Eliquis.  Reviewing chart appears no one called the office to notify Dr.Klein of pt having surgery.  Advised that she was instructed to stop Eliquis 48 hrs prior to biopsy so instructed [name] to tell pt to stop Eliquis for Sat and Sunday.  She also will be faxing over pacemaker/ICD form that needs to filled out by physician.  Advised will forward to [place] to make her aware.

## 2015-01-27 NOTE — Telephone Encounter (Signed)
New message      Pt is scheduled to have surgery Monday---question about stopping eliquis---she insist on talking to someone now

## 2015-01-27 NOTE — Pre-Procedure Instructions (Signed)
KENIYA SCHLOTTERBECK  01/27/2015   Your procedure is scheduled on:  01/30/15  Report to [facility] at 930 AM.  Call this number if you have problems the morning of surgery: [phone]   Remember:   Do not eat food or drink liquids after midnight.   Take these medicines the morning of surgery with A SIP OF WATER: inhalers,ranexa, levothyroxine,metoprolol, omeprazole, sulfasalazine    STOP all herbel meds, nsaids (aleve,naproxen,advil,ibuprofen) now including vitamins   NO INSULIN AM OF SURGERY  STOP ELIQUIS PER DR   Do not wear jewelry, make-up or nail polish.  Do not wear lotions, powders, or perfumes. You may wear deodorant.  Do not shave 48 hours prior to surgery. Men may shave face and neck.  Do not bring valuables to the hospital.  [facility] is not responsible                  for any belongings or valuables.               Contacts, dentures or bridgework may not be worn into surgery.  Leave suitcase in the car. After surgery it may be brought to your room.  For patients admitted to the hospital, discharge time is determined by your                treatment team.               Patients discharged the day of surgery will not be allowed to drive  home.  Name and phone number of your driver:   Special Instructions:  Special Instructions: [place] - Preparing for Surgery  Before surgery, you can play an important role.  Because skin is not sterile, your skin needs to be as free of germs as possible.  You can reduce the number of germs on you skin by washing with CHG (chlorahexidine gluconate) soap before surgery.  CHG is an antiseptic cleaner which kills germs and bonds with the skin to continue killing germs even after washing.  Please DO NOT use if you have an allergy to CHG or antibacterial soaps.  If your skin becomes reddened/irritated stop using the CHG and inform your nurse when you arrive at Short Stay.  Do not shave (including legs and underarms) for at  least 48 hours prior to the first CHG shower.  You may shave your face.  Please follow these instructions carefully:   1.  Shower with CHG Soap the night before surgery and the morning of Surgery.  2.  If you choose to wash your hair, wash your hair first as usual with your normal shampoo.  3.  After you shampoo, rinse your hair and body thoroughly to remove the Shampoo.  4.  Use CHG as you would any other liquid soap.  You can apply chg directly  to the skin and wash gently with scrungie or a clean washcloth.  5.  Apply the CHG Soap to your body ONLY FROM THE NECK DOWN.  Do not use on open wounds or open sores.  Avoid contact with your eyes ears, mouth and genitals (private parts).  Wash genitals (private parts)       with your normal soap.  6.  Wash thoroughly, paying special attention to the area where your surgery will be performed.  7.  Thoroughly rinse your body with warm water from the neck down.  8.  DO NOT shower/wash with your normal soap after using and rinsing  off the CHG Soap.  9.  Pat yourself dry with a clean towel.            10.  Wear clean pajamas.            11.  Place clean sheets on your bed the night of your first shower and do not sleep with pets.  Day of Surgery  Do not apply any lotions/deodorants the morning of surgery.  Please wear clean clothes to the hospital/surgery center.   Please read over the following fact sheets that you were given: Pain Booklet, Coughing and Deep Breathing and Surgical Site Infection Prevention

## 2015-01-29 MED ORDER — DEXTROSE 5 % IV SOLN
3.0000 g | INTRAVENOUS | Status: AC
  Administered 2015-01-30: 3 g via INTRAVENOUS
  Filled 2015-01-29: qty 3000

## 2015-01-30 ENCOUNTER — Ambulatory Visit (HOSPITAL_COMMUNITY): Payer: Medicare Other | Admitting: Emergency Medicine

## 2015-01-30 ENCOUNTER — Ambulatory Visit (HOSPITAL_COMMUNITY): Payer: Medicare Other | Admitting: Anesthesiology

## 2015-01-30 ENCOUNTER — Encounter (HOSPITAL_COMMUNITY): Payer: Self-pay | Admitting: Surgery

## 2015-01-30 ENCOUNTER — Ambulatory Visit (HOSPITAL_COMMUNITY): Payer: Medicare Other

## 2015-01-30 ENCOUNTER — Encounter (HOSPITAL_COMMUNITY)
Admission: RE | Disposition: A | Discharge status: Home / Self Care | Payer: Self-pay | Source: Ambulatory Visit | Attending: Surgery

## 2015-01-30 ENCOUNTER — Ambulatory Visit (HOSPITAL_COMMUNITY)
Admission: RE | Admit: 2015-01-30 | Discharge: 2015-01-30 | Disposition: A | Discharge status: Home / Self Care | Payer: Medicare Other | Source: Ambulatory Visit | Attending: Surgery | Admitting: Surgery

## 2015-01-30 DIAGNOSIS — E210 Primary hyperparathyroidism: Secondary | ICD-10-CM | POA: Diagnosis present

## 2015-01-30 DIAGNOSIS — R0902 Hypoxemia: Secondary | ICD-10-CM

## 2015-01-30 DIAGNOSIS — J449 Chronic obstructive pulmonary disease, unspecified: Secondary | ICD-10-CM | POA: Insufficient documentation

## 2015-01-30 DIAGNOSIS — E119 Type 2 diabetes mellitus without complications: Secondary | ICD-10-CM | POA: Insufficient documentation

## 2015-01-30 DIAGNOSIS — Z87891 Personal history of nicotine dependence: Secondary | ICD-10-CM | POA: Insufficient documentation

## 2015-01-30 DIAGNOSIS — I2510 Atherosclerotic heart disease of native coronary artery without angina pectoris: Secondary | ICD-10-CM | POA: Insufficient documentation

## 2015-01-30 DIAGNOSIS — K5090 Crohn's disease, unspecified, without complications: Secondary | ICD-10-CM | POA: Insufficient documentation

## 2015-01-30 DIAGNOSIS — D351 Benign neoplasm of parathyroid gland: Secondary | ICD-10-CM | POA: Diagnosis not present

## 2015-01-30 DIAGNOSIS — E039 Hypothyroidism, unspecified: Secondary | ICD-10-CM | POA: Insufficient documentation

## 2015-01-30 DIAGNOSIS — E780 Pure hypercholesterolemia: Secondary | ICD-10-CM | POA: Insufficient documentation

## 2015-01-30 HISTORY — PX: PARATHYROIDECTOMY: SHX19

## 2015-01-30 LAB — GLUCOSE, CAPILLARY
GLUCOSE-CAPILLARY: 119 mg/dL — AB (ref 70–99)
Glucose-Capillary: 104 mg/dL — ABNORMAL HIGH (ref 70–99)
Glucose-Capillary: 110 mg/dL — ABNORMAL HIGH (ref 70–99)

## 2015-01-30 LAB — PROTIME-INR
INR: 1.02 (ref 0.00–1.49)
Prothrombin Time: 13.5 seconds (ref 11.6–15.2)

## 2015-01-30 SURGERY — PARATHYROIDECTOMY
Anesthesia: General | Site: Neck | Laterality: Right

## 2015-01-30 MED ORDER — DEXAMETHASONE SODIUM PHOSPHATE 10 MG/ML IJ SOLN
INTRAMUSCULAR | Status: DC | PRN
  Administered 2015-01-30: 4 mg via INTRAVENOUS

## 2015-01-30 MED ORDER — LIDOCAINE HCL 4 % MT SOLN
OROMUCOSAL | Status: DC | PRN
  Administered 2015-01-30: 4 mL via TOPICAL

## 2015-01-30 MED ORDER — ARTIFICIAL TEARS OP OINT
TOPICAL_OINTMENT | OPHTHALMIC | Status: AC
  Filled 2015-01-30: qty 3.5

## 2015-01-30 MED ORDER — DEXAMETHASONE SODIUM PHOSPHATE 4 MG/ML IJ SOLN
INTRAMUSCULAR | Status: AC
  Filled 2015-01-30: qty 2

## 2015-01-30 MED ORDER — ONDANSETRON HCL 4 MG/2ML IJ SOLN
INTRAMUSCULAR | Status: DC | PRN
  Administered 2015-01-30: 4 mg via INTRAVENOUS

## 2015-01-30 MED ORDER — PROPOFOL 10 MG/ML IV BOLUS
INTRAVENOUS | Status: DC | PRN
  Administered 2015-01-30: 200 mg via INTRAVENOUS
  Administered 2015-01-30: 30 mg via INTRAVENOUS

## 2015-01-30 MED ORDER — ONDANSETRON HCL 4 MG/2ML IJ SOLN
INTRAMUSCULAR | Status: AC
  Filled 2015-01-30: qty 2

## 2015-01-30 MED ORDER — ARTIFICIAL TEARS OP OINT
TOPICAL_OINTMENT | OPHTHALMIC | Status: DC | PRN
  Administered 2015-01-30: 1 via OPHTHALMIC

## 2015-01-30 MED ORDER — HYDROMORPHONE HCL 1 MG/ML IJ SOLN: 0.2500 mg | INTRAMUSCULAR | Status: DC | PRN

## 2015-01-30 MED ORDER — DEXAMETHASONE SODIUM PHOSPHATE 4 MG/ML IJ SOLN
INTRAMUSCULAR | Status: AC
  Filled 2015-01-30: qty 1

## 2015-01-30 MED ORDER — HEMOSTATIC AGENTS (NO CHARGE) OPTIME
TOPICAL | Status: DC | PRN
  Administered 2015-01-30: 1 via TOPICAL

## 2015-01-30 MED ORDER — MEPERIDINE HCL 25 MG/ML IJ SOLN: 6.2500 mg | INTRAMUSCULAR | Status: DC | PRN

## 2015-01-30 MED ORDER — MIDAZOLAM HCL 2 MG/2ML IJ SOLN
INTRAMUSCULAR | Status: AC
  Filled 2015-01-30: qty 2

## 2015-01-30 MED ORDER — NEOSTIGMINE METHYLSULFATE 10 MG/10ML IV SOLN
INTRAVENOUS | Status: AC
  Filled 2015-01-30: qty 1

## 2015-01-30 MED ORDER — LIDOCAINE HCL (CARDIAC) 20 MG/ML IV SOLN
INTRAVENOUS | Status: DC | PRN
  Administered 2015-01-30: 50 mg via INTRAVENOUS

## 2015-01-30 MED ORDER — ROCURONIUM BROMIDE 50 MG/5ML IV SOLN
INTRAVENOUS | Status: AC
  Filled 2015-01-30: qty 1

## 2015-01-30 MED ORDER — BUPIVACAINE HCL (PF) 0.25 % IJ SOLN
INTRAMUSCULAR | Status: DC | PRN
  Administered 2015-01-30: 10 mL

## 2015-01-30 MED ORDER — PHENYLEPHRINE HCL 10 MG/ML IJ SOLN
INTRAMUSCULAR | Status: DC | PRN
  Administered 2015-01-30 (×5): 80 ug via INTRAVENOUS

## 2015-01-30 MED ORDER — MIDAZOLAM HCL 5 MG/5ML IJ SOLN
INTRAMUSCULAR | Status: DC | PRN
  Administered 2015-01-30: 2 mg via INTRAVENOUS

## 2015-01-30 MED ORDER — 0.9 % SODIUM CHLORIDE (POUR BTL) OPTIME
TOPICAL | Status: DC | PRN
  Administered 2015-01-30: 1000 mL

## 2015-01-30 MED ORDER — PROPOFOL 10 MG/ML IV BOLUS
INTRAVENOUS | Status: AC
  Filled 2015-01-30: qty 20

## 2015-01-30 MED ORDER — LIDOCAINE HCL (CARDIAC) 20 MG/ML IV SOLN
INTRAVENOUS | Status: AC
  Filled 2015-01-30: qty 5

## 2015-01-30 MED ORDER — GLYCOPYRROLATE 0.2 MG/ML IJ SOLN
INTRAMUSCULAR | Status: DC | PRN
  Administered 2015-01-30: .8 mg via INTRAVENOUS

## 2015-01-30 MED ORDER — OXYCODONE HCL 5 MG PO TABS: 5.0000 mg | ORAL_TABLET | Freq: Four times a day (QID) | ORAL | Status: DC | PRN

## 2015-01-30 MED ORDER — LACTATED RINGERS IV SOLN
INTRAVENOUS | Status: DC
  Administered 2015-01-30 (×3): via INTRAVENOUS

## 2015-01-30 MED ORDER — NEOSTIGMINE METHYLSULFATE 10 MG/10ML IV SOLN
INTRAVENOUS | Status: DC | PRN
  Administered 2015-01-30: 5 mg via INTRAVENOUS

## 2015-01-30 MED ORDER — PROMETHAZINE HCL 25 MG/ML IJ SOLN: 6.2500 mg | INTRAMUSCULAR | Status: DC | PRN

## 2015-01-30 MED ORDER — FENTANYL CITRATE (PF) 250 MCG/5ML IJ SOLN
INTRAMUSCULAR | Status: AC
  Filled 2015-01-30: qty 5

## 2015-01-30 MED ORDER — FENTANYL CITRATE (PF) 100 MCG/2ML IJ SOLN
INTRAMUSCULAR | Status: DC | PRN
  Administered 2015-01-30: 50 ug via INTRAVENOUS

## 2015-01-30 MED ORDER — BUPIVACAINE HCL (PF) 0.25 % IJ SOLN
INTRAMUSCULAR | Status: AC
  Filled 2015-01-30: qty 30

## 2015-01-30 MED ORDER — ROCURONIUM BROMIDE 100 MG/10ML IV SOLN
INTRAVENOUS | Status: DC | PRN
  Administered 2015-01-30: 40 mg via INTRAVENOUS

## 2015-01-30 MED ORDER — GLYCOPYRROLATE 0.2 MG/ML IJ SOLN
INTRAMUSCULAR | Status: AC
  Filled 2015-01-30: qty 4

## 2015-01-30 SURGICAL SUPPLY — 56 items
APL SKNCLS STERI-STRIP NONHPOA (GAUZE/BANDAGES/DRESSINGS) ×1
ATTRACTOMAT 16X20 MAGNETIC DRP (DRAPES) ×3 IMPLANT
BENZOIN TINCTURE PRP APPL 2/3 (GAUZE/BANDAGES/DRESSINGS) ×3 IMPLANT
BLADE SURG 10 STRL SS (BLADE) ×3 IMPLANT
BLADE SURG 15 STRL LF DISP TIS (BLADE) ×1 IMPLANT
BLADE SURG 15 STRL SS (BLADE) ×3
CANISTER SUCTION 2500CC (MISCELLANEOUS) ×3 IMPLANT
CHLORAPREP W/TINT 10.5 ML (MISCELLANEOUS) ×1 IMPLANT
CHLORAPREP W/TINT 26ML (MISCELLANEOUS) ×2 IMPLANT
CLIP TI MEDIUM 6 (CLIP) ×3 IMPLANT
CLIP TI WIDE RED SMALL 6 (CLIP) ×5 IMPLANT
CLOSURE WOUND 1/2 X4 (GAUZE/BANDAGES/DRESSINGS) ×1
CONT SPEC 4OZ CLIKSEAL STRL BL (MISCELLANEOUS) ×5 IMPLANT
COVER SURGICAL LIGHT HANDLE (MISCELLANEOUS) ×3 IMPLANT
DRAPE PED LAPAROTOMY (DRAPES) ×3 IMPLANT
DRAPE UTILITY XL STRL (DRAPES) ×4 IMPLANT
ELECT CAUTERY BLADE 6.4 (BLADE) ×3 IMPLANT
ELECT REM PT RETURN 9FT ADLT (ELECTROSURGICAL) ×3
ELECTRODE REM PT RTRN 9FT ADLT (ELECTROSURGICAL) ×1 IMPLANT
GAUZE SPONGE 2X2 8PLY STRL LF (GAUZE/BANDAGES/DRESSINGS) ×1 IMPLANT
GAUZE SPONGE 4X4 16PLY XRAY LF (GAUZE/BANDAGES/DRESSINGS) ×3 IMPLANT
GLOVE BIOGEL PI IND STRL 7.0 (GLOVE) IMPLANT
GLOVE BIOGEL PI IND STRL 7.5 (GLOVE) IMPLANT
GLOVE BIOGEL PI INDICATOR 7.0 (GLOVE) ×2
GLOVE BIOGEL PI INDICATOR 7.5 (GLOVE) ×2
GLOVE ECLIPSE 7.5 STRL STRAW (GLOVE) ×2 IMPLANT
GLOVE SURG ORTHO 8.0 STRL STRW (GLOVE) ×3 IMPLANT
GLOVE SURG SS PI 7.0 STRL IVOR (GLOVE) ×2 IMPLANT
GOWN STRL REUS W/ TWL LRG LVL3 (GOWN DISPOSABLE) ×2 IMPLANT
GOWN STRL REUS W/ TWL XL LVL3 (GOWN DISPOSABLE) ×1 IMPLANT
GOWN STRL REUS W/TWL LRG LVL3 (GOWN DISPOSABLE) ×6
GOWN STRL REUS W/TWL XL LVL3 (GOWN DISPOSABLE) ×3
HEMOSTAT SURGICEL 2X4 FIBR (HEMOSTASIS) ×3 IMPLANT
KIT BASIN OR (CUSTOM PROCEDURE TRAY) ×3 IMPLANT
KIT ROOM TURNOVER OR (KITS) ×3 IMPLANT
NDL HYPO 25GX1X1/2 BEV (NEEDLE) IMPLANT
NEEDLE HYPO 25GX1X1/2 BEV (NEEDLE) ×3 IMPLANT
NS IRRIG 1000ML POUR BTL (IV SOLUTION) ×3 IMPLANT
PACK SURGICAL SETUP 50X90 (CUSTOM PROCEDURE TRAY) ×3 IMPLANT
PAD ARMBOARD 7.5X6 YLW CONV (MISCELLANEOUS) ×6 IMPLANT
PENCIL BUTTON HOLSTER BLD 10FT (ELECTRODE) ×3 IMPLANT
SPONGE GAUZE 2X2 STER 10/PKG (GAUZE/BANDAGES/DRESSINGS) ×2
SPONGE INTESTINAL PEANUT (DISPOSABLE) IMPLANT
STRIP CLOSURE SKIN 1/2X4 (GAUZE/BANDAGES/DRESSINGS) ×2 IMPLANT
SUT MNCRL AB 4-0 PS2 18 (SUTURE) ×3 IMPLANT
SUT SILK 2 0 (SUTURE) ×3
SUT SILK 2-0 18XBRD TIE 12 (SUTURE) IMPLANT
SUT SILK 3 0 (SUTURE)
SUT SILK 3-0 18XBRD TIE 12 (SUTURE) IMPLANT
SUT VIC AB 3-0 SH 18 (SUTURE) ×3 IMPLANT
SYR BULB 3OZ (MISCELLANEOUS) ×2 IMPLANT
SYR CONTROL 10ML LL (SYRINGE) ×2 IMPLANT
TOWEL OR 17X24 6PK STRL BLUE (TOWEL DISPOSABLE) ×1 IMPLANT
TOWEL OR 17X26 10 PK STRL BLUE (TOWEL DISPOSABLE) ×3 IMPLANT
TUBE CONNECTING 12'X1/4 (SUCTIONS) ×1
TUBE CONNECTING 12X1/4 (SUCTIONS) ×2 IMPLANT

## 2015-01-30 NOTE — Transfer of Care (Signed)
Immediate Anesthesia Transfer of Care Note  Patient: [name]  Procedure(s) Performed: Procedure(s): PARATHYROIDECTOMY (Right)  Patient Location: PACU  Anesthesia Type:General  Level of Consciousness: awake, alert , oriented and patient cooperative  Airway & Oxygen Therapy: Patient Spontanous Breathing and Patient connected to nasal cannula oxygen  Post-op Assessment: Report given to RN and Post -op Vital signs reviewed and stable  Post vital signs: Reviewed and stable  Last Vitals:  Filed Vitals:   01/30/15 0944  BP: 137/90  Pulse: 64  Temp: 36.9 C  Resp: 18    Complications: No apparent anesthesia complications

## 2015-01-30 NOTE — Anesthesia Procedure Notes (Signed)
Procedure Name: Intubation Performed by: Laretta Alstrom Pre-anesthesia Checklist: Patient identified, Emergency Drugs available, Suction available, Patient being monitored and Timeout performed Patient Re-evaluated:Patient Re-evaluated prior to inductionOxygen Delivery Method: Circle system utilized Preoxygenation: Pre-oxygenation with 100% oxygen Intubation Type: IV induction Ventilation: Mask ventilation without difficulty Laryngoscope Size: Mac and 3 Grade View: Grade I Tube type: Oral Endobronchial tube: Right Tube size: 7.0 mm Number of attempts: 1 Airway Equipment and Method: Patient positioned with wedge pillow and Stylet Placement Confirmation: ETT inserted through vocal cords under direct vision,  positive ETCO2,  CO2 detector and breath sounds checked- equal and bilateral Secured at: 24 cm Tube secured with: Tape

## 2015-01-30 NOTE — Anesthesia Preprocedure Evaluation (Signed)
Anesthesia Evaluation  Patient identified by MRN, date of birth, ID band Patient awake    Reviewed: Allergy & Precautions, NPO status , Patient's Chart, lab work & pertinent test results  Airway Mallampati: II   Neck ROM: Full  Mouth opening: Limited Mouth Opening  Dental  (+) Teeth Intact   Pulmonary shortness of breath, sleep apnea , former smoker,  breath sounds clear to auscultation        Cardiovascular hypertension, + CAD + dysrhythmias + pacemaker Rhythm:Irregular Rate:Normal     Neuro/Psych    GI/Hepatic GERD-  ,  Endo/Other  diabetesMorbid obesity  Renal/GU      Musculoskeletal   Abdominal (+) + obese,   Peds  Hematology   Anesthesia Other Findings   Reproductive/Obstetrics                             Anesthesia Physical Anesthesia Plan  ASA: III  Anesthesia Plan: General   Post-op Pain Management:    Induction: Intravenous  Airway Management Planned: Oral ETT  Additional Equipment:   Intra-op Plan:   Post-operative Plan: Extubation in OR  Informed Consent: I have reviewed the patients History and Physical, chart, labs and discussed the procedure including the risks, benefits and alternatives for the proposed anesthesia with the patient or authorized representative who has indicated his/her understanding and acceptance.   Dental advisory given  Plan Discussed with: CRNA and Surgeon  Anesthesia Plan Comments:         Anesthesia Quick Evaluation

## 2015-01-30 NOTE — H&P (Signed)
General Surgery [facility]  [name] DOB: [birth year] Divorced / Language: English / Race: White Female  History of Present Illness   The patient is a 74 year old female who presents with a parathyroid neoplasm. Patient returns to discuss test results and thyroid biopsies. She is accompanied by her son. Patient had undergone an ultrasound exam to try to identify a parathyroid adenoma. This did show a 1.0 cm nodular density near the inferior right thyroid lobe which was possibly representing a parathyroid adenoma. However there were 2 nodules in the left thyroid lobe measuring 1.8 cm and 1.7 cm respectively. These required ultrasound-guided fine-needle aspiration biopsy. Both were follicular lesions, Bethesda category 2, and felt to represent benign thyroid nodules. Patient also underwent a repeat intact PTH level which was markedly elevated at 160. A 24-hour urine collection for calcium was normal at 120. Vitamin D level was normal at 40. Patient returns today to review these results and to discuss plans for surgery.   Other Problems Chronic Obstructive Lung Disease Crohn's Disease Diabetes Mellitus High blood pressure Hypercholesterolemia  Past Surgical History  Breast Biopsy Right. Cataract Surgery Left. Tonsillectomy  Diagnostic Studies History  Colonoscopy 1-5 years ago Pap Smear >5 years ago  Allergies  No Known Drug Allergies03/09/2014  Medication History Advair Diskus (500-50MCG/DOSE Aero Pow Br Act, Inhalation) Active. Atorvastatin Calcium (80MG  Tablet, Oral) Active. Eliquis (5MG  Tablet, Oral) Active. FreeStyle Lancets Active. Furosemide (40MG  Tablet, Oral) Active. FreeStyle Lite Test (In Vitro) Active. Klor-Con M20 [facility] Tablet ER, Oral) Active. Lantus (100UNIT/ML Solution, Subcutaneous) Active. Levothyroxine Sodium (75MCG Tablet, Oral) Active. Losartan Potassium (50MG  Tablet, Oral) Active. Metoprolol  Tartrate (100MG  Tablet, Oral) Active. Nitrostat (0.4MG  Tab Sublingual, Sublingual) Active. SulfaSALAzine (500MG  Tablet, Oral) Active. Medications Reconciled  Social History Alcohol use Remotely quit alcohol use. Caffeine use Carbonated beverages. No drug use Tobacco use Former smoker.  Family History Breast Cancer Mother. Heart disease in female family member before age 68  Pregnancy / Birth History Age at menarche 5 years. Age of menopause 51-55 Contraceptive History Contraceptive implant. Gravida 2 Maternal age 55-25 Para 2  Review of Systems General Present- Fatigue. Not Present- Appetite Loss, Chills, Fever, Night Sweats, Weight Gain and Weight Loss. Skin Not Present- Change in Wart/Mole, Dryness, Hives, Jaundice, New Lesions, Non-Healing Wounds, Rash and Ulcer. HEENT Present- Ringing in the Ears. Not Present- Earache, Hearing Loss, Hoarseness, Nose Bleed, Oral Ulcers, Seasonal Allergies, Sinus Pain, Sore Throat, Visual Disturbances, Wears glasses/contact lenses and Yellow Eyes. Respiratory Present- Chronic Cough, Difficulty Breathing and Wheezing. Not Present- Bloody sputum and Snoring. Cardiovascular Present- Rapid Heart Rate and Shortness of Breath. Not Present- Chest Pain, Difficulty Breathing Lying Down, Leg Cramps, Palpitations and Swelling of Extremities. Female Genitourinary Not Present- Frequency, Nocturia, Painful Urination, Pelvic Pain and Urgency. Musculoskeletal Present- Back Pain and Muscle Weakness. Not Present- Joint Pain, Joint Stiffness, Muscle Pain and Swelling of Extremities. Neurological Present- Trouble walking and Weakness. Not Present- Decreased Memory, Fainting, Headaches, Numbness, Seizures, Tingling and Tremor. Hematology Not Present- Easy Bruising, Excessive bleeding, Gland problems, HIV and Persistent Infections.   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 We reviewed her laboratory studies, her fine-needle aspiration biopsy results, and her thyroid  ultrasound results. I provided them with copies of the ultrasound and the cytopathology.  I have recommended proceeding with minimally invasive right inferior parathyroidectomy. We have discussed the possibility that this does not represent a parathyroid adenoma and that the patient may need to be converted to a full neck exploration for parathyroidectomy. I do not recommend removing any of the thyroid tissue at this point in time.  Patient has previously been provided with written literature to review at home. She understands the procedure. We will lace orders today and schedule her for surgery at one of the hospitals in the near future at a time convenient for the patient.  The risks and benefits of the procedure have been discussed at length with the patient. The patient understands the proposed procedure, potential alternative treatments, and the course of recovery to be expected. All of the patient's questions have been answered at this time. The patient wishes to proceed with surgery.  Earnstine Regal, MD, [facility] Office: [phone]

## 2015-01-30 NOTE — Interval H&P Note (Signed)
History and Physical Interval Note:  01/30/2015 12:18 PM  [name]  has presented today for surgery, with the diagnosis of PRIMARY HYPERPARATHYROIDISM.  The various methods of treatment have been discussed with the patient and family. After consideration of risks, benefits and other options for treatment, the patient has consented to    Procedure(s): PARATHYROIDECTOMY (N/A) as a surgical intervention .    The patient's history has been reviewed, patient examined, no change in status, stable for surgery.  I have reviewed the patient's chart and labs.  Questions were answered to the patient's satisfaction.    Earnstine Regal, MD, [facility] Office: [place]

## 2015-01-30 NOTE — Op Note (Signed)
OPERATIVE REPORT - PARATHYROIDECTOMY  Preoperative diagnosis: Primary hyperparathyroidism  Postop diagnosis: Same  Procedure: Right superior and inferior minimally invasive parathyroidectomy  Surgeon:  Earnstine Regal, MD, FACS  Anesthesia: Gen. endotracheal  Estimated blood loss: Minimal  Preparation: ChloraPrep  Indications: The patient is a 74 year old female who presents with a parathyroid neoplasm. Patient returns to discuss test results and thyroid biopsies. She is accompanied by her son. Patient had undergone an ultrasound exam to try to identify a parathyroid adenoma. This did show a 1.0 cm nodular density near the inferior right thyroid lobe which was possibly representing a parathyroid adenoma. Patient also underwent a repeat intact PTH level which was markedly elevated at 160. Patient now comes to surgery for parathyroidectomy.  Procedure: Patient was prepared in the holding area. He was brought to operating room and placed in a supine position on the operating room table. Following administration of general anesthesia, the patient was positioned and then prepped and draped in the usual strict aseptic fashion. After ascertaining that an adequate level of anesthesia been achieved, a neck incision was made with a #15 blade. Dissection was carried through subcutaneous tissues and platysma. Hemostasis was obtained with the electrocautery. Skin flaps were developed circumferentially and a Weitlander retractor was placed for exposure.  Strap muscles were incised in the midline. Strap muscles were reflected exposing the thyroid lobe. With gentle blunt dissection the thyroid lobe was mobilized.  Dissection was carried through adipose tissue and an enlarged parathyroid gland was identified. It was gently mobilized. Vascular structures were divided between small and medium ligaclips. Care was taken to avoid the recurrent laryngeal nerve and the esophagus. The parathyroid gland was completely  excised. It was submitted to pathology where frozen section confirmed parathyroid tissue consistent with adenoma.  Further exploration reveals an additional mass in the pre-cervical space posterior to the right lobe of the thyroid.  This is dissected out and mobilized.  It separates from the right thyroid lobe and is resected.  It is submitted to pathology and frozen section again demonstrates hypercellular parathyroid tissue.  Neck was irrigated with warm saline and good hemostasis was noted. Fibrillar was placed in the operative field. Strap muscles were reapproximated in the midline with interrupted 3-0 Vicryl sutures. Platysma was closed with interrupted 3-0 Vicryl sutures. Skin was closed with a running 4-0 Monocryl subcuticular suture. Marcaine was infiltrated circumferentially. Wound was washed and dried and benzoin and Steri-Strips were applied. Sterile gauze dressings were applied. Patient was awakened from anesthesia and brought to the recovery room. The patient tolerated the procedure well.   Earnstine Regal, MD, [facility]

## 2015-01-30 NOTE — Anesthesia Postprocedure Evaluation (Signed)
Anesthesia Post Note  Patient: [name]  Procedure(s) Performed: Procedure(s) (LRB): PARATHYROIDECTOMY (Right)  Anesthesia type: General  Patient location: PACU  Post pain: Pain level controlled  Post assessment: Post-op Vital signs reviewed  Last Vitals: BP 144/71 mmHg  Pulse 79  Temp(Src) 36.4 C (Oral)  Resp 28  Ht 5\' 11"  (1.803 m)  Wt 290 lb 9 oz (131.798 kg)  BMI 40.54 kg/m2  SpO2 92%  Post vital signs: Reviewed  Level of consciousness: sedated  Complications: No apparent anesthesia complications

## 2015-01-31 ENCOUNTER — Encounter (HOSPITAL_COMMUNITY): Payer: Self-pay | Admitting: Surgery

## 2015-02-15 ENCOUNTER — Inpatient Hospital Stay (HOSPITAL_COMMUNITY)
Admission: EM | Admit: 2015-02-15 | Discharge: 2015-02-18 | DRG: 291 | Disposition: A | Discharge status: Skilled Nursing Facility | Payer: Medicare Other | Attending: Internal Medicine | Admitting: Internal Medicine

## 2015-02-15 ENCOUNTER — Encounter (HOSPITAL_COMMUNITY): Payer: Self-pay | Admitting: Emergency Medicine

## 2015-02-15 ENCOUNTER — Emergency Department (HOSPITAL_COMMUNITY): Payer: Medicare Other

## 2015-02-15 DIAGNOSIS — J9621 Acute and chronic respiratory failure with hypoxia: Secondary | ICD-10-CM | POA: Diagnosis not present

## 2015-02-15 DIAGNOSIS — J441 Chronic obstructive pulmonary disease with (acute) exacerbation: Secondary | ICD-10-CM | POA: Diagnosis present

## 2015-02-15 DIAGNOSIS — I429 Cardiomyopathy, unspecified: Secondary | ICD-10-CM | POA: Diagnosis present

## 2015-02-15 DIAGNOSIS — I5043 Acute on chronic combined systolic (congestive) and diastolic (congestive) heart failure: Secondary | ICD-10-CM | POA: Diagnosis present

## 2015-02-15 DIAGNOSIS — I25119 Atherosclerotic heart disease of native coronary artery with unspecified angina pectoris: Secondary | ICD-10-CM | POA: Diagnosis present

## 2015-02-15 DIAGNOSIS — M810 Age-related osteoporosis without current pathological fracture: Secondary | ICD-10-CM | POA: Diagnosis present

## 2015-02-15 DIAGNOSIS — I482 Chronic atrial fibrillation: Secondary | ICD-10-CM | POA: Diagnosis present

## 2015-02-15 DIAGNOSIS — R0600 Dyspnea, unspecified: Secondary | ICD-10-CM | POA: Diagnosis not present

## 2015-02-15 DIAGNOSIS — Z79899 Other long term (current) drug therapy: Secondary | ICD-10-CM

## 2015-02-15 DIAGNOSIS — G478 Other sleep disorders: Secondary | ICD-10-CM | POA: Diagnosis present

## 2015-02-15 DIAGNOSIS — Z87891 Personal history of nicotine dependence: Secondary | ICD-10-CM

## 2015-02-15 DIAGNOSIS — Z8249 Family history of ischemic heart disease and other diseases of the circulatory system: Secondary | ICD-10-CM

## 2015-02-15 DIAGNOSIS — I10 Essential (primary) hypertension: Secondary | ICD-10-CM | POA: Diagnosis present

## 2015-02-15 DIAGNOSIS — I272 Other secondary pulmonary hypertension: Secondary | ICD-10-CM | POA: Diagnosis present

## 2015-02-15 DIAGNOSIS — E785 Hyperlipidemia, unspecified: Secondary | ICD-10-CM | POA: Diagnosis present

## 2015-02-15 DIAGNOSIS — G4733 Obstructive sleep apnea (adult) (pediatric): Secondary | ICD-10-CM | POA: Diagnosis present

## 2015-02-15 DIAGNOSIS — E1165 Type 2 diabetes mellitus with hyperglycemia: Secondary | ICD-10-CM | POA: Diagnosis not present

## 2015-02-15 DIAGNOSIS — J438 Other emphysema: Secondary | ICD-10-CM

## 2015-02-15 DIAGNOSIS — I509 Heart failure, unspecified: Secondary | ICD-10-CM | POA: Diagnosis not present

## 2015-02-15 DIAGNOSIS — I2729 Other secondary pulmonary hypertension: Secondary | ICD-10-CM

## 2015-02-15 DIAGNOSIS — Z9119 Patient's noncompliance with other medical treatment and regimen: Secondary | ICD-10-CM | POA: Diagnosis present

## 2015-02-15 DIAGNOSIS — E039 Hypothyroidism, unspecified: Secondary | ICD-10-CM | POA: Diagnosis present

## 2015-02-15 DIAGNOSIS — Z6841 Body Mass Index (BMI) 40.0 and over, adult: Secondary | ICD-10-CM

## 2015-02-15 DIAGNOSIS — I5041 Acute combined systolic (congestive) and diastolic (congestive) heart failure: Secondary | ICD-10-CM | POA: Diagnosis not present

## 2015-02-15 DIAGNOSIS — Z794 Long term (current) use of insulin: Secondary | ICD-10-CM | POA: Diagnosis not present

## 2015-02-15 DIAGNOSIS — Z803 Family history of malignant neoplasm of breast: Secondary | ICD-10-CM

## 2015-02-15 DIAGNOSIS — K219 Gastro-esophageal reflux disease without esophagitis: Secondary | ICD-10-CM | POA: Diagnosis present

## 2015-02-15 DIAGNOSIS — E662 Morbid (severe) obesity with alveolar hypoventilation: Secondary | ICD-10-CM | POA: Diagnosis present

## 2015-02-15 DIAGNOSIS — Z825 Family history of asthma and other chronic lower respiratory diseases: Secondary | ICD-10-CM | POA: Diagnosis not present

## 2015-02-15 DIAGNOSIS — Z950 Presence of cardiac pacemaker: Secondary | ICD-10-CM

## 2015-02-15 DIAGNOSIS — K5190 Ulcerative colitis, unspecified, without complications: Secondary | ICD-10-CM | POA: Diagnosis present

## 2015-02-15 DIAGNOSIS — I4891 Unspecified atrial fibrillation: Secondary | ICD-10-CM | POA: Diagnosis present

## 2015-02-15 DIAGNOSIS — J439 Emphysema, unspecified: Secondary | ICD-10-CM | POA: Diagnosis present

## 2015-02-15 DIAGNOSIS — I739 Peripheral vascular disease, unspecified: Secondary | ICD-10-CM | POA: Diagnosis present

## 2015-02-15 DIAGNOSIS — E119 Type 2 diabetes mellitus without complications: Secondary | ICD-10-CM

## 2015-02-15 DIAGNOSIS — Z7901 Long term (current) use of anticoagulants: Secondary | ICD-10-CM | POA: Diagnosis not present

## 2015-02-15 DIAGNOSIS — I5023 Acute on chronic systolic (congestive) heart failure: Secondary | ICD-10-CM | POA: Diagnosis not present

## 2015-02-15 LAB — COMPREHENSIVE METABOLIC PANEL
ALK PHOS: 102 U/L (ref 38–126)
ALT: 18 U/L (ref 14–54)
AST: 28 U/L (ref 15–41)
Albumin: 3.5 g/dL (ref 3.5–5.0)
Anion gap: 11 (ref 5–15)
BUN: 22 mg/dL — ABNORMAL HIGH (ref 6–20)
CO2: 23 mmol/L (ref 22–32)
Calcium: 10.2 mg/dL (ref 8.9–10.3)
Chloride: 102 mmol/L (ref 101–111)
Creatinine, Ser: 1.12 mg/dL — ABNORMAL HIGH (ref 0.44–1.00)
GFR calc non Af Amer: 47 mL/min — ABNORMAL LOW (ref >60)
GFR, EST AFRICAN AMERICAN: 55 mL/min — AB (ref >60)
GLUCOSE: 153 mg/dL — AB (ref 65–99)
POTASSIUM: 5.1 mmol/L (ref 3.5–5.1)
SODIUM: 136 mmol/L (ref 135–145)
Total Bilirubin: 0.7 mg/dL (ref 0.3–1.2)
Total Protein: 6.9 g/dL (ref 6.5–8.1)

## 2015-02-15 LAB — URINE MICROSCOPIC-ADD ON

## 2015-02-15 LAB — TROPONIN I: Troponin I: <0.03 ng/mL (ref <0.031)

## 2015-02-15 LAB — URINALYSIS, ROUTINE W REFLEX MICROSCOPIC
GLUCOSE, UA: NEGATIVE mg/dL
KETONES UR: NEGATIVE mg/dL
LEUKOCYTES UA: NEGATIVE
Nitrite: NEGATIVE
PH: 6 (ref 5.0–8.0)
Protein, ur: >300 mg/dL — AB
Specific Gravity, Urine: 1.038 — ABNORMAL HIGH (ref 1.005–1.030)
Urobilinogen, UA: 0.2 mg/dL (ref 0.0–1.0)

## 2015-02-15 LAB — CBC WITH DIFFERENTIAL/PLATELET
BASOS ABS: 0.1 10*3/uL (ref 0.0–0.1)
BASOS PCT: 1 % (ref 0–1)
Eosinophils Absolute: 0 10*3/uL (ref 0.0–0.7)
Eosinophils Relative: 0 % (ref 0–5)
HCT: 52.4 % — ABNORMAL HIGH (ref 36.0–46.0)
Hemoglobin: 17 g/dL — ABNORMAL HIGH (ref 12.0–15.0)
LYMPHS PCT: 11 % — AB (ref 12–46)
Lymphs Abs: 1 10*3/uL (ref 0.7–4.0)
MCH: 31.8 pg (ref 26.0–34.0)
MCHC: 32.4 g/dL (ref 30.0–36.0)
MCV: 98.1 fL (ref 78.0–100.0)
MONO ABS: 0.8 10*3/uL (ref 0.1–1.0)
Monocytes Relative: 9 % (ref 3–12)
NEUTROS PCT: 79 % — AB (ref 43–77)
Neutro Abs: 7.3 10*3/uL (ref 1.7–7.7)
Platelets: 318 10*3/uL (ref 150–400)
RBC: 5.34 MIL/uL — ABNORMAL HIGH (ref 3.87–5.11)
RDW: 14.6 % (ref 11.5–15.5)
WBC: 9.2 10*3/uL (ref 4.0–10.5)

## 2015-02-15 LAB — BRAIN NATRIURETIC PEPTIDE: B NATRIURETIC PEPTIDE 5: 1515.5 pg/mL — AB (ref 0.0–100.0)

## 2015-02-15 MED ORDER — SODIUM CHLORIDE 0.9 % IV SOLN
INTRAVENOUS | Status: DC
  Administered 2015-02-15: 16:00:00 via INTRAVENOUS

## 2015-02-15 MED ORDER — METHYLPREDNISOLONE SODIUM SUCC 125 MG IJ SOLR
125.0000 mg | INTRAMUSCULAR | Status: AC
  Administered 2015-02-15: 125 mg via INTRAVENOUS
  Filled 2015-02-15: qty 2

## 2015-02-15 MED ORDER — ALBUTEROL SULFATE (2.5 MG/3ML) 0.083% IN NEBU: 3.0000 mL | INHALATION_SOLUTION | Freq: Four times a day (QID) | RESPIRATORY_TRACT | Status: DC | PRN

## 2015-02-15 MED ORDER — IPRATROPIUM BROMIDE 0.02 % IN SOLN
0.5000 mg | Freq: Four times a day (QID) | RESPIRATORY_TRACT | Status: DC
  Administered 2015-02-15: 0.5 mg via RESPIRATORY_TRACT
  Filled 2015-02-15: qty 2.5

## 2015-02-15 MED ORDER — METHYLPREDNISOLONE SODIUM SUCC 125 MG IJ SOLR
60.0000 mg | Freq: Four times a day (QID) | INTRAMUSCULAR | Status: DC
  Administered 2015-02-15 – 2015-02-16 (×2): 60 mg via INTRAVENOUS
  Filled 2015-02-15 (×4): qty 0.96

## 2015-02-15 MED ORDER — FUROSEMIDE 10 MG/ML IJ SOLN
20.0000 mg | Freq: Two times a day (BID) | INTRAMUSCULAR | Status: DC
  Administered 2015-02-15 – 2015-02-16 (×2): 20 mg via INTRAVENOUS
  Filled 2015-02-15 (×2): qty 2

## 2015-02-15 MED ORDER — ATORVASTATIN CALCIUM 80 MG PO TABS
80.0000 mg | ORAL_TABLET | Freq: Every day | ORAL | Status: DC
  Administered 2015-02-15 – 2015-02-17 (×3): 80 mg via ORAL
  Filled 2015-02-15 (×3): qty 1

## 2015-02-15 MED ORDER — ONDANSETRON HCL 4 MG/2ML IJ SOLN: 4.0000 mg | Freq: Four times a day (QID) | INTRAMUSCULAR | Status: DC | PRN

## 2015-02-15 MED ORDER — GUAIFENESIN ER 600 MG PO TB12
600.0000 mg | ORAL_TABLET | Freq: Two times a day (BID) | ORAL | Status: DC
  Administered 2015-02-15 – 2015-02-18 (×6): 600 mg via ORAL
  Filled 2015-02-15 (×6): qty 1

## 2015-02-15 MED ORDER — FUROSEMIDE 10 MG/ML IJ SOLN
60.0000 mg | Freq: Once | INTRAMUSCULAR | Status: AC
  Administered 2015-02-15: 60 mg via INTRAVENOUS
  Filled 2015-02-15: qty 8

## 2015-02-15 MED ORDER — SODIUM CHLORIDE 0.9 % IV SOLN
INTRAVENOUS | Status: DC
  Administered 2015-02-15 (×2): via INTRAVENOUS

## 2015-02-15 MED ORDER — ENOXAPARIN SODIUM 40 MG/0.4ML ~~LOC~~ SOLN: 40.0000 mg | SUBCUTANEOUS | Status: DC

## 2015-02-15 MED ORDER — SODIUM CHLORIDE 0.9 % IV SOLN: 250.0000 mL | INTRAVENOUS | Status: DC | PRN

## 2015-02-15 MED ORDER — LOSARTAN POTASSIUM 50 MG PO TABS
50.0000 mg | ORAL_TABLET | Freq: Every day | ORAL | Status: DC
  Administered 2015-02-15 – 2015-02-18 (×4): 50 mg via ORAL
  Filled 2015-02-15 (×4): qty 1

## 2015-02-15 MED ORDER — APIXABAN 5 MG PO TABS
5.0000 mg | ORAL_TABLET | Freq: Two times a day (BID) | ORAL | Status: DC
  Administered 2015-02-15 – 2015-02-18 (×6): 5 mg via ORAL
  Filled 2015-02-15 (×6): qty 1

## 2015-02-15 MED ORDER — LEVOFLOXACIN 500 MG PO TABS
500.0000 mg | ORAL_TABLET | ORAL | Status: DC
  Administered 2015-02-16 – 2015-02-18 (×3): 500 mg via ORAL
  Filled 2015-02-15 (×3): qty 1

## 2015-02-15 MED ORDER — OMEPRAZOLE 20 MG PO CPDR
20.0000 mg | DELAYED_RELEASE_CAPSULE | Freq: Every day | ORAL | Status: DC
  Administered 2015-02-16 – 2015-02-18 (×3): 20 mg via ORAL
  Filled 2015-02-15 (×3): qty 1

## 2015-02-15 MED ORDER — SODIUM CHLORIDE 0.9 % IV SOLN
INTRAVENOUS | Status: AC
  Administered 2015-02-15: 17:00:00 via INTRAVENOUS

## 2015-02-15 MED ORDER — ALBUTEROL SULFATE (2.5 MG/3ML) 0.083% IN NEBU: 2.5000 mg | INHALATION_SOLUTION | RESPIRATORY_TRACT | Status: DC | PRN

## 2015-02-15 MED ORDER — ACETAMINOPHEN 650 MG RE SUPP: 650.0000 mg | Freq: Four times a day (QID) | RECTAL | Status: DC | PRN

## 2015-02-15 MED ORDER — LEVOTHYROXINE SODIUM 75 MCG PO TABS
75.0000 ug | ORAL_TABLET | Freq: Every day | ORAL | Status: DC
  Administered 2015-02-16 – 2015-02-18 (×3): 75 ug via ORAL
  Filled 2015-02-15 (×3): qty 1

## 2015-02-15 MED ORDER — METOPROLOL TARTRATE 50 MG PO TABS
100.0000 mg | ORAL_TABLET | Freq: Two times a day (BID) | ORAL | Status: DC
  Administered 2015-02-15 – 2015-02-18 (×6): 100 mg via ORAL
  Filled 2015-02-15 (×6): qty 2

## 2015-02-15 MED ORDER — ALBUTEROL SULFATE (2.5 MG/3ML) 0.083% IN NEBU
5.0000 mg | INHALATION_SOLUTION | Freq: Once | RESPIRATORY_TRACT | Status: AC
  Administered 2015-02-15: 5 mg via RESPIRATORY_TRACT
  Filled 2015-02-15: qty 6

## 2015-02-15 MED ORDER — SODIUM CHLORIDE 0.9 % IJ SOLN: 3.0000 mL | INTRAMUSCULAR | Status: DC | PRN

## 2015-02-15 MED ORDER — SODIUM CHLORIDE 0.9 % IJ SOLN: 3.0000 mL | Freq: Two times a day (BID) | INTRAMUSCULAR | Status: DC

## 2015-02-15 MED ORDER — SULFASALAZINE 500 MG PO TABS
1000.0000 mg | ORAL_TABLET | Freq: Two times a day (BID) | ORAL | Status: DC
  Administered 2015-02-15 – 2015-02-18 (×6): 1000 mg via ORAL
  Filled 2015-02-15 (×6): qty 2

## 2015-02-15 MED ORDER — ONDANSETRON HCL 4 MG PO TABS: 4.0000 mg | ORAL_TABLET | Freq: Four times a day (QID) | ORAL | Status: DC | PRN

## 2015-02-15 MED ORDER — ALBUTEROL SULFATE (2.5 MG/3ML) 0.083% IN NEBU
2.5000 mg | INHALATION_SOLUTION | Freq: Four times a day (QID) | RESPIRATORY_TRACT | Status: DC
  Administered 2015-02-15: 2.5 mg via RESPIRATORY_TRACT
  Filled 2015-02-15: qty 3

## 2015-02-15 MED ORDER — ACETAMINOPHEN 325 MG PO TABS: 650.0000 mg | ORAL_TABLET | Freq: Four times a day (QID) | ORAL | Status: DC | PRN

## 2015-02-15 MED ORDER — INSULIN GLARGINE 100 UNIT/ML ~~LOC~~ SOLN
30.0000 [IU] | Freq: Every morning | SUBCUTANEOUS | Status: DC
  Administered 2015-02-16 – 2015-02-18 (×3): 30 [IU] via SUBCUTANEOUS
  Filled 2015-02-15 (×4): qty 0.3

## 2015-02-15 MED ORDER — RANOLAZINE ER 500 MG PO TB12
500.0000 mg | ORAL_TABLET | Freq: Two times a day (BID) | ORAL | Status: DC
  Administered 2015-02-15 – 2015-02-18 (×6): 500 mg via ORAL
  Filled 2015-02-15 (×6): qty 1

## 2015-02-15 MED ORDER — IPRATROPIUM-ALBUTEROL 0.5-2.5 (3) MG/3ML IN SOLN
3.0000 mL | Freq: Three times a day (TID) | RESPIRATORY_TRACT | Status: DC
  Administered 2015-02-16 – 2015-02-17 (×5): 3 mL via RESPIRATORY_TRACT
  Filled 2015-02-15 (×6): qty 3

## 2015-02-15 NOTE — ED Provider Notes (Signed)
CSN: [id number]     Arrival date & time 02/15/15  1046 History   First MD Initiated Contact with Patient 02/15/15 1048     Chief Complaint  Patient presents with  . Shortness of Breath     (Consider location/radiation/quality/duration/timing/severity/associated sxs/prior Treatment) HPI Patient presents with concern of weakness, dyspnea. Symptoms began 3 days ago, without clear precipitant. Since onset symptoms have been progressive, with no focal pain, confusion, disorientation, fever. Patient saw her physician yesterday, was started on Levaquin, for infection. She denies changes in her condition since initiating antibiotics per She states that she is taking all medication as directed, including diuretics for heart failure.  Past Medical History  Diagnosis Date  . Nonischemic cardiomyopathy     history of,  EF 20-25% at left heart cath in 06/2007; echo 2011 had normal LV function  . Chronic diastolic heart failure     Echo 06/2010: Moderate LVH, EF 55-65%, normal wall motion, mild MR, moderate to severe LAE, mild RAE.   . Morbid obesity   . Hyperlipidemia   . Tachy-brady syndrome     status post implant of a medtronic dual-chamber pacemaker in 2001.  explanted in 2010 -- [place] pacemaker in 2010.  Marland Kitchen Chronic obstructive pulmonary disease   . Osteoporosis   . Depression   . GERD (gastroesophageal reflux disease)   . Crohn's disease   . Hypothyroidism     treated  . Seasonal allergies   . Pacemaker     Medtronic  . Atrial fibrillation     Status post pulmonary vein isolation 2009 at [facility]  . CAD (coronary artery disease)     LHC 05/2007: pLAD 70-80%, pRCA 40%, EF 20-25%. LAD lesion treated medically.   . Coronary artery disease 06/15/2012  . Atrial fibrillation 12/18/2007    Annotation: refractory Qualifier: Diagnosis of  By: Doy Mince LPN, Megan    . Morbid obesity with BMI of 40.0-44.9, adult 06/22/2012  . Diabetes mellitus 06/22/2012  . Atrial  fibrillation   . Diabetes mellitus without complication   . COPD (chronic obstructive pulmonary disease)   . Gout   . Dysrhythmia     atrial fib  . Peripheral vascular disease 15    rt arm clots  . Obstructive sleep apnea     continuous positive airway pressure not using at present  . Shortness of breath dyspnea     exersion   Past Surgical History  Procedure Laterality Date  . Pulmonary vein isolation  05/12/2008    RFCA atrial fibrillation  . Post ablation pseudoaneurysm      at A fib ablation  . Pacemaker insertion  2010    medtronic ADAPTA   . Tonsillectomy    . Embolectomy Right 08/16/2014    Procedure: EMBOLECTOMY- RIGHT BRACHIAL ARTERY;  Surgeon: Serafina Mitchell, MD;  Location: [facility] OR;  Service: Vascular;  Laterality: Right;  . Cardiac catheterization  08/2000    noncritical disease mid RCA, EF preserved  . Cardiac catheterization  05/29/2007    noncritical disease, EF 20-25%  . Eye surgery Right 15    cataracts  lft yrs ago  . Parathyroidectomy Right 01/30/2015    Procedure: PARATHYROIDECTOMY;  Surgeon: Armandina Gemma, MD;  Location: [place];  Service: General;  Laterality: Right;   Family History  Problem Relation Age of Onset  . Breast cancer Mother   . Heart disease Father   . Emphysema Father    History  Substance Use Topics  . Smoking  status: Former Smoker -- 3.00 packs/day for 32 years    Quit date: 09/30/1986  . Smokeless tobacco: Former Systems developer    Quit date: 08/15/1987  . Alcohol Use: No   OB History    Gravida Para Term Preterm AB TAB SAB Ectopic Multiple Living   0 0 0 0 0 0 0 0       Review of Systems  Constitutional:       Per HPI, otherwise negative  HENT:       Per HPI, otherwise negative  Respiratory:       Per HPI, otherwise negative  Cardiovascular:       Per HPI, otherwise negative  Gastrointestinal: Negative for vomiting.  Endocrine:       Negative aside from HPI  Genitourinary:       Neg aside from HPI   Musculoskeletal:       Per HPI,  otherwise negative  Skin: Negative.   Neurological: Negative for syncope.      Allergies  Review of patient's allergies indicates no known allergies.  Home Medications   Prior to Admission medications   Medication Sig Start Date End Date Taking? Authorizing Provider  albuterol (PROVENTIL HFA;VENTOLIN HFA) 108 (90 BASE) MCG/ACT inhaler Inhale 2 puffs into the lungs every 6 (six) hours as needed for wheezing or shortness of breath.   Yes Historical Provider, MD  apixaban (ELIQUIS) 5 MG TABS tablet Take 1 tablet (5 mg total) by mouth 2 (two) times daily. 03/09/14  Yes Deboraha Sprang, MD  atorvastatin (LIPITOR) 80 MG tablet Take 80 mg by mouth at bedtime.     Yes Historical Provider, MD  Fiber CAPS Take by mouth 2 (two) times daily.    Yes Historical Provider, MD  Fluticasone-Salmeterol (ADVAIR) 250-50 MCG/DOSE AEPB Inhale 1 puff into the lungs every 12 (twelve) hours. 01/17/15  Yes Kathee Delton, MD  furosemide (LASIX) 40 MG tablet TAKE 1 TABLET BY MOUTH EVERY MORNING AND TAKE 2 TABLETS EVERY EVENING Patient taking differently: as needed 09/27/14  Yes Deboraha Sprang, MD  insulin glargine (LANTUS) 100 UNIT/ML injection Inject 30 Units into the skin every morning.    Yes Historical Provider, MD  levofloxacin (LEVAQUIN) 500 MG tablet Take 500 mg by mouth daily. 02/14/15  Yes Historical Provider, MD  levothyroxine (SYNTHROID, LEVOTHROID) 75 MCG tablet Take 75 mcg by mouth daily. 01/20/15  Yes Historical Provider, MD  loratadine (CLARITIN) 10 MG tablet Take 10 mg by mouth as needed.    Yes Historical Provider, MD  losartan (COZAAR) 50 MG tablet Take 50 mg by mouth daily. 01/16/15  Yes Historical Provider, MD  metoprolol (LOPRESSOR) 100 MG tablet TAKE 1 TABLET (100 MG TOTAL) BY MOUTH 2 (TWO) TIMES DAILY. 12/23/14  Yes Deboraha Sprang, MD  Multiple Vitamins-Minerals (MULTIVITAMIN WITH MINERALS) tablet Take 1 tablet by mouth daily.   Yes Historical Provider, MD  nitroGLYCERIN (NITROSTAT) 0.4 MG SL tablet  Place 1 tablet (0.4 mg total) under the tongue every 5 (five) minutes as needed for chest pain. 07/07/14 07/07/15 Yes Deboraha Sprang, MD  nystatin cream (MYCOSTATIN) Apply 1 application topically daily as needed. 02/14/15  Yes Historical Provider, MD  omeprazole (PRILOSEC OTC) 20 MG tablet Take 20 mg by mouth daily.     Yes Historical Provider, MD  potassium chloride SA (KLOR-CON M20) 20 MEQ tablet Take 1 tablet (20 mEq total) by mouth 2 (two) times daily. 03/09/14  Yes Deboraha Sprang, MD  PROLENSA 0.07 % SOLN  Place 1 drop into the right eye. 01/09/15  Yes Historical Provider, MD  RANEXA 500 MG 12 hr tablet TAKE 1 TABLET (500 MG TOTAL) BY MOUTH 2 (TWO) TIMES DAILY. 01/02/15  Yes Deboraha Sprang, MD  sulfaSALAzine (AZULFIDINE) 500 MG tablet Take 1,000 mg by mouth 2 (two) times daily.    Yes Historical Provider, MD  tiotropium (SPIRIVA) 18 MCG inhalation capsule Place 18 mcg into inhaler and inhale daily.   Yes Historical Provider, MD  B-D INS SYRINGE 0.5CC/31GX5/16 31G X 5/16" 0.5 ML MISC as directed. 11/07/10   Historical Provider, MD  FREESTYLE LITE test strip as directed. 01/11/11   Historical Provider, MD  Lancets (FREESTYLE) lancets as directed. 01/26/11   Historical Provider, MD  oxyCODONE (OXY IR/ROXICODONE) 5 MG immediate release tablet Take 1-2 tablets (5-10 mg total) by mouth every 6 (six) hours as needed for moderate pain. Patient not taking: Reported on 02/15/2015 01/30/15   Armandina Gemma, MD   BP 153/86 mmHg  Pulse 97  Temp(Src) 98 F (36.7 C) (Oral)  Ht 5\' 11"  (1.803 m)  Wt 289 lb (131.09 kg)  BMI 40.33 kg/m2  SpO2 96% Physical Exam  Constitutional: She is oriented to person, place, and time. She appears well-developed and well-nourished. No distress.  HENT:  Head: Normocephalic and atraumatic.  Eyes: Conjunctivae and EOM are normal.  Cardiovascular: Normal rate and regular rhythm.   Pulmonary/Chest: Accessory muscle usage present. No stridor. Tachypnea noted.  Abdominal: She exhibits no  distension.  Musculoskeletal: She exhibits no edema.  Neurological: She is alert and oriented to person, place, and time. No cranial nerve deficit.  Skin: Skin is warm and dry.  Psychiatric: She has a normal mood and affect.  Nursing note and vitals reviewed.   ED Course  Procedures (including critical care time) Labs Review Labs Reviewed  COMPREHENSIVE METABOLIC PANEL - Abnormal; Notable for the following:    Glucose, Bld 153 (*)    BUN 22 (*)    Creatinine, Ser 1.12 (*)    GFR calc non Af Amer 47 (*)    GFR calc Af Amer 55 (*)    All other components within normal limits  CBC WITH DIFFERENTIAL/PLATELET - Abnormal; Notable for the following:    RBC 5.34 (*)    Hemoglobin 17.0 (*)    HCT 52.4 (*)    Neutrophils Relative % 79 (*)    Lymphocytes Relative 11 (*)    All other components within normal limits  BRAIN NATRIURETIC PEPTIDE - Abnormal; Notable for the following:    B Natriuretic Peptide 1515.5 (*)    All other components within normal limits  URINALYSIS, ROUTINE W REFLEX MICROSCOPIC - Abnormal; Notable for the following:    Color, Urine AMBER (*)    APPearance CLOUDY (*)    Specific Gravity, Urine 1.038 (*)    Hgb urine dipstick MODERATE (*)    Bilirubin Urine SMALL (*)    Protein, ur >300 (*)    All other components within normal limits  URINE MICROSCOPIC-ADD ON - Abnormal; Notable for the following:    Squamous Epithelial / LPF FEW (*)    Casts GRANULAR CAST (*)    All other components within normal limits  TROPONIN I    Imaging Review Dg Chest 2 View  02/15/2015   CLINICAL DATA:  New shortness of breath with weakness. No other chest complaints.  EXAM: CHEST  2 VIEW  COMPARISON:  01/30/2015.  Also 04/25/2014.  FINDINGS: Cardiomegaly. Dual lead transvenous pacer. Mild  vascular congestion. No overt failure or infiltrates. No definite osseous findings. Minor thoracic vertebral body irregularity appears similar to priors.  IMPRESSION: Stable cardiomegaly and mild  vascular congestion.   Electronically Signed   By: Rolla Flatten M.D.   On: 02/15/2015 12:03     EKG Interpretation   Date/Time:  Wednesday Feb 15 2015 11:17:05 EDT Ventricular Rate:  94 PR Interval:    QRS Duration: 125 QT Interval:  397 QTC Calculation: 496 R Axis:   160 Text Interpretation:  Atrial fibrillation Nonspecific intraventricular  conduction delay Abnormal lateral Q waves Anteroseptal infarct, old  Minimal ST depression, lateral leads No significant change since last  tracing Confirmed by Debby Freiberg [phone]) on 02/15/2015 11:26:38 AM     Pulse ox 90 room air abnormal Improves to 97% with nasal cannula abnormal Cardiac 90 sinus rhythm normal  2:12 PM Patient continues to be dyspneic, with mild tachypnea. With concern for COPD, CHF, decreased kidney function all contribute to her ongoing symptoms, patient will be admitted for further evaluation and management.  MDM   Elderly female with CHF, COPD, CK-MB now presents with worsening dyspnea. Patient is hypoxic on room air, tachypneic, tachycardic. No evidence for pneumonia, the patient's dyspnea seems likely due to multifactorial aorta, including worsening CHF, COPD, progression of poor kidney function. Patient received fluid resuscitation, diuretics, bronchodilators, steroids. Patient was admitted for further evaluation and management.   Carmin Muskrat, MD 02/15/15 [phone]

## 2015-02-15 NOTE — ED Notes (Signed)
Bed: WA11 Expected date:  Expected time:  Means of arrival:  Comments: EMS 

## 2015-02-15 NOTE — ED Notes (Signed)
She states she has had shortness of breath for past couple of days; plus weakness.  She states she saw her pcp yesterday, who prescribed Levaquin--"no better".  She is in no distress.

## 2015-02-15 NOTE — H&P (Signed)
PCP:   [name], MD   Chief Complaint:  Shortness of breath  HPI:  74 year old female who  has a past medical history of Nonischemic cardiomyopathy; Chronic diastolic heart failure; Morbid obesity; Hyperlipidemia; Tachy-brady syndrome; Chronic obstructive pulmonary disease; Osteoporosis; Depression; GERD (gastroesophageal reflux disease); Crohn's disease; Hypothyroidism; Seasonal allergies; Pacemaker; Atrial fibrillation; CAD (coronary artery disease); Coronary artery disease (06/15/2012); Atrial fibrillation (12/18/2007); Morbid obesity with BMI of 40.0-44.9, adult (06/22/2012); Diabetes mellitus (06/22/2012); Atrial fibrillation; Diabetes mellitus without complication; COPD (chronic obstructive pulmonary disease); Gout; Dysrhythmia; Peripheral vascular disease (15); Obstructive sleep apnea; and Shortness of breath dyspnea. *Today came to the hospital with chief: No shortness of breath for past 3 days. Patient also complains of orthopnea, has been complaining of difficulty laying down flat. Denies fever, has cough but denies any phlegm. Patient had nausea and vomiting yesterday. Denies diarrhea. Patient was seen by the PCP yesterday and was started on Levaquin. Patient says that she did not feel better with antibiotics and she came to the hospital. In the ED patient was found to have vascular congestion on chest x-ray with elevated BNP 1515. Patient has a history of diastolic heart failure She denies dysuria urgency frequency of urination.  Allergies:  No Known Allergies    Past Medical History  Diagnosis Date  . Nonischemic cardiomyopathy     history of,  EF 20-25% at left heart cath in 06/2007; echo 2011 had normal LV function  . Chronic diastolic heart failure     Echo 06/2010: Moderate LVH, EF 55-65%, normal wall motion, mild MR, moderate to severe LAE, mild RAE.   . Morbid obesity   . Hyperlipidemia   . Tachy-brady syndrome     status post implant of a medtronic dual-chamber  pacemaker in 2001.  explanted in 2010 -- [place] pacemaker in 2010.  Marland Kitchen Chronic obstructive pulmonary disease   . Osteoporosis   . Depression   . GERD (gastroesophageal reflux disease)   . Crohn's disease   . Hypothyroidism     treated  . Seasonal allergies   . Pacemaker     Medtronic  . Atrial fibrillation     Status post pulmonary vein isolation 2009 at [facility]  . CAD (coronary artery disease)     LHC 05/2007: pLAD 70-80%, pRCA 40%, EF 20-25%. LAD lesion treated medically.   . Coronary artery disease 06/15/2012  . Atrial fibrillation 12/18/2007    Annotation: refractory Qualifier: Diagnosis of  By: Doy Mince LPN, Megan    . Morbid obesity with BMI of 40.0-44.9, adult 06/22/2012  . Diabetes mellitus 06/22/2012  . Atrial fibrillation   . Diabetes mellitus without complication   . COPD (chronic obstructive pulmonary disease)   . Gout   . Dysrhythmia     atrial fib  . Peripheral vascular disease 15    rt arm clots  . Obstructive sleep apnea     continuous positive airway pressure not using at present  . Shortness of breath dyspnea     exersion    Past Surgical History  Procedure Laterality Date  . Pulmonary vein isolation  05/12/2008    RFCA atrial fibrillation  . Post ablation pseudoaneurysm      at A fib ablation  . Pacemaker insertion  2010    medtronic ADAPTA   . Tonsillectomy    . Embolectomy Right 08/16/2014    Procedure: EMBOLECTOMY- RIGHT BRACHIAL ARTERY;  Surgeon: Serafina Mitchell, MD;  Location: [place];  Service: Vascular;  Laterality: Right;  .  Cardiac catheterization  08/2000    noncritical disease mid RCA, EF preserved  . Cardiac catheterization  05/29/2007    noncritical disease, EF 20-25%  . Eye surgery Right 15    cataracts  lft yrs ago  . Parathyroidectomy Right 01/30/2015    Procedure: PARATHYROIDECTOMY;  Surgeon: Armandina Gemma, MD;  Location: [place];  Service: General;  Laterality: Right;    Prior to Admission medications     Medication Sig Start Date End Date Taking? Authorizing Provider  albuterol (PROVENTIL HFA;VENTOLIN HFA) 108 (90 BASE) MCG/ACT inhaler Inhale 2 puffs into the lungs every 6 (six) hours as needed for wheezing or shortness of breath.   Yes Historical Provider, MD  apixaban (ELIQUIS) 5 MG TABS tablet Take 1 tablet (5 mg total) by mouth 2 (two) times daily. 03/09/14  Yes Deboraha Sprang, MD  atorvastatin (LIPITOR) 80 MG tablet Take 80 mg by mouth at bedtime.     Yes Historical Provider, MD  Fiber CAPS Take by mouth 2 (two) times daily.    Yes Historical Provider, MD  Fluticasone-Salmeterol (ADVAIR) 250-50 MCG/DOSE AEPB Inhale 1 puff into the lungs every 12 (twelve) hours. 01/17/15  Yes Kathee Delton, MD  furosemide (LASIX) 40 MG tablet TAKE 1 TABLET BY MOUTH EVERY MORNING AND TAKE 2 TABLETS EVERY EVENING Patient taking differently: as needed 09/27/14  Yes Deboraha Sprang, MD  insulin glargine (LANTUS) 100 UNIT/ML injection Inject 30 Units into the skin every morning.    Yes Historical Provider, MD  levofloxacin (LEVAQUIN) 500 MG tablet Take 500 mg by mouth daily. 02/14/15  Yes Historical Provider, MD  levothyroxine (SYNTHROID, LEVOTHROID) 75 MCG tablet Take 75 mcg by mouth daily. 01/20/15  Yes Historical Provider, MD  loratadine (CLARITIN) 10 MG tablet Take 10 mg by mouth as needed.    Yes Historical Provider, MD  losartan (COZAAR) 50 MG tablet Take 50 mg by mouth daily. 01/16/15  Yes Historical Provider, MD  metoprolol (LOPRESSOR) 100 MG tablet TAKE 1 TABLET (100 MG TOTAL) BY MOUTH 2 (TWO) TIMES DAILY. 12/23/14  Yes Deboraha Sprang, MD  Multiple Vitamins-Minerals (MULTIVITAMIN WITH MINERALS) tablet Take 1 tablet by mouth daily.   Yes Historical Provider, MD  nitroGLYCERIN (NITROSTAT) 0.4 MG SL tablet Place 1 tablet (0.4 mg total) under the tongue every 5 (five) minutes as needed for chest pain. 07/07/14 07/07/15 Yes Deboraha Sprang, MD  nystatin cream (MYCOSTATIN) Apply 1 application topically daily as needed.  02/14/15  Yes Historical Provider, MD  omeprazole (PRILOSEC OTC) 20 MG tablet Take 20 mg by mouth daily.     Yes Historical Provider, MD  potassium chloride SA (KLOR-CON M20) 20 MEQ tablet Take 1 tablet (20 mEq total) by mouth 2 (two) times daily. 03/09/14  Yes Deboraha Sprang, MD  PROLENSA 0.07 % SOLN Place 1 drop into the right eye. 01/09/15  Yes Historical Provider, MD  RANEXA 500 MG 12 hr tablet TAKE 1 TABLET (500 MG TOTAL) BY MOUTH 2 (TWO) TIMES DAILY. 01/02/15  Yes Deboraha Sprang, MD  sulfaSALAzine (AZULFIDINE) 500 MG tablet Take 1,000 mg by mouth 2 (two) times daily.    Yes Historical Provider, MD  tiotropium (SPIRIVA) 18 MCG inhalation capsule Place 18 mcg into inhaler and inhale daily.   Yes Historical Provider, MD  B-D INS SYRINGE 0.5CC/31GX5/16 31G X 5/16" 0.5 ML MISC as directed. 11/07/10   Historical Provider, MD  FREESTYLE LITE test strip as directed. 01/11/11   Historical Provider, MD  Lancets (FREESTYLE) lancets  as directed. 01/26/11   Historical Provider, MD  oxyCODONE (OXY IR/ROXICODONE) 5 MG immediate release tablet Take 1-2 tablets (5-10 mg total) by mouth every 6 (six) hours as needed for moderate pain. Patient not taking: Reported on 02/15/2015 01/30/15   Armandina Gemma, MD    Social History:  reports that she quit smoking about 28 years ago. She quit smokeless tobacco use about 27 years ago. She reports that she does not drink alcohol or use illicit drugs.  Family History  Problem Relation Age of Onset  . Breast cancer Mother   . Heart disease Father   . Emphysema Father      All the positives are listed in BOLD  Review of Systems:  HEENT: Headache, blurred vision, runny nose, sore throat Neck: Hypothyroidism, hyperthyroidism,,lymphadenopathy Chest : Shortness of breath, history of COPD, Asthma Heart : Chest pain, history of coronary arterey disease GI:  Nausea, vomiting, diarrhea, constipation, GERD GU: Dysuria, urgency, frequency of urination, hematuria Neuro: Stroke,  seizures, syncope Psych: Depression, anxiety, hallucinations   Physical Exam: Blood pressure 135/82, pulse 91, temperature 97.4 F (36.3 C), temperature source Oral, resp. rate 18, height 5\' 11"  (1.803 m), weight 131.09 kg (289 lb), SpO2 94 %. Constitutional:   Patient is a well-developed and well-nourished female in no acute distress and cooperative with exam. Head: Normocephalic and atraumatic Mouth: Mucus membranes moist Eyes: PERRL, EOMI, conjunctivae normal Neck: Supple, No Thyromegaly Cardiovascular: RRR, S1 normal, S2 normal Pulmonary/Chest: Bilateral rhonchi Abdominal: Soft. Non-tender, non-distended, bowel sounds are normal, no masses, organomegaly, or guarding present.  Neurological: A&O x3, Strength is normal and symmetric bilaterally, cranial nerve II-XII are grossly intact, no focal motor deficit, sensory intact to light touch bilaterally.  Extremities : No Cyanosis, Clubbing or Edema  Labs on Admission:  Basic Metabolic Panel:  Recent Labs Lab 02/15/15 1129  NA 136  K 5.1  CL 102  CO2 23  GLUCOSE 153*  BUN 22*  CREATININE 1.12*  CALCIUM 10.2   Liver Function Tests:  Recent Labs Lab 02/15/15 1129  AST 28  ALT 18  ALKPHOS 102  BILITOT 0.7  PROT 6.9  ALBUMIN 3.5   No results for input(s): LIPASE, AMYLASE in the last 168 hours. No results for input(s): AMMONIA in the last 168 hours. CBC:  Recent Labs Lab 02/15/15 1129  WBC 9.2  NEUTROABS 7.3  HGB 17.0*  HCT 52.4*  MCV 98.1  PLT 318   Cardiac Enzymes:  Recent Labs Lab 02/15/15 1129  TROPONINI <0.03    BNP (last 3 results)  Recent Labs  02/15/15 1129  BNP 1515.5*    ProBNP (last 3 results) No results for input(s): PROBNP in the last 8760 hours.  CBG: No results for input(s): GLUCAP in the last 168 hours.  Radiological Exams on Admission: Dg Chest 2 View  02/15/2015   CLINICAL DATA:  New shortness of breath with weakness. No other chest complaints.  EXAM: CHEST  2 VIEW   COMPARISON:  01/30/2015.  Also 04/25/2014.  FINDINGS: Cardiomegaly. Dual lead transvenous pacer. Mild vascular congestion. No overt failure or infiltrates. No definite osseous findings. Minor thoracic vertebral body irregularity appears similar to priors.  IMPRESSION: Stable cardiomegaly and mild vascular congestion.   Electronically Signed   By: Rolla Flatten M.D.   On: 02/15/2015 12:03    EKG: Independently reviewed. Atrial fibrillation   Assessment/Plan Active Problems:   Essential hypertension   COPD with emphysema   CARDIAC PACEMAKER IN SITU-MEDTRONIC ADAPTA L   Long term  current use of anticoagulant therapy   Diabetes mellitus   Dyspnea   CHF exacerbation  CHF exacerbation Patient presenting with CHF exacerbation, will start Lasix 20 minute grams IV every 12 hours. Obtain 2-D echo in a.m. Strict I's and O's.  COPD exacerbation Patient also has underlying COPD, will start Solomon 1216 g IV every 6 hours, DuoNeb nebulizers every 6 hours, albuterol every 2 hours when necessary.  Diabetes mellitus Continue Lantus 30 units, will initiate sliding scale insulin with NovoLog.  Atrial fibrillation Continue metoprolol, apixaban.    Code status: Full code  Family discussion: Admission, patients condition and plan of care including tests being ordered have been discussed with the patient and *her son at bedside* who indicate understanding and agree with the plan and Code Status.   Time Spent on Admission: 60 min  [place] [facility] Pager: [phone] 02/15/2015, 4:24 PM  If 7PM-7AM, please contact night-coverage  [email or web address]  Password [email or web address]

## 2015-02-15 NOTE — ED Notes (Signed)
Spoke to [place] pt can go to floor at 15:10.Justin Mend

## 2015-02-15 NOTE — ED Notes (Signed)
told pt the need for a urine sample just as soon as she could provide one

## 2015-02-16 ENCOUNTER — Inpatient Hospital Stay (HOSPITAL_COMMUNITY): Payer: Medicare Other

## 2015-02-16 DIAGNOSIS — I5023 Acute on chronic systolic (congestive) heart failure: Secondary | ICD-10-CM

## 2015-02-16 DIAGNOSIS — I509 Heart failure, unspecified: Secondary | ICD-10-CM

## 2015-02-16 DIAGNOSIS — J441 Chronic obstructive pulmonary disease with (acute) exacerbation: Secondary | ICD-10-CM

## 2015-02-16 DIAGNOSIS — E1165 Type 2 diabetes mellitus with hyperglycemia: Secondary | ICD-10-CM

## 2015-02-16 LAB — CBC
HCT: 49.9 % — ABNORMAL HIGH (ref 36.0–46.0)
Hemoglobin: 15.7 g/dL — ABNORMAL HIGH (ref 12.0–15.0)
MCH: 31.1 pg (ref 26.0–34.0)
MCHC: 31.5 g/dL (ref 30.0–36.0)
MCV: 98.8 fL (ref 78.0–100.0)
Platelets: 281 10*3/uL (ref 150–400)
RBC: 5.05 MIL/uL (ref 3.87–5.11)
RDW: 14.6 % (ref 11.5–15.5)
WBC: 5.9 10*3/uL (ref 4.0–10.5)

## 2015-02-16 LAB — GLUCOSE, CAPILLARY
Glucose-Capillary: 197 mg/dL — ABNORMAL HIGH (ref 65–99)
Glucose-Capillary: 260 mg/dL — ABNORMAL HIGH (ref 65–99)

## 2015-02-16 LAB — COMPREHENSIVE METABOLIC PANEL
ALK PHOS: 87 U/L (ref 38–126)
ALT: 16 U/L (ref 14–54)
ANION GAP: 12 (ref 5–15)
AST: 29 U/L (ref 15–41)
Albumin: 2.9 g/dL — ABNORMAL LOW (ref 3.5–5.0)
BILIRUBIN TOTAL: 1 mg/dL (ref 0.3–1.2)
BUN: 25 mg/dL — ABNORMAL HIGH (ref 6–20)
CHLORIDE: 99 mmol/L — AB (ref 101–111)
CO2: 27 mmol/L (ref 22–32)
Calcium: 10 mg/dL (ref 8.9–10.3)
Creatinine, Ser: 1.25 mg/dL — ABNORMAL HIGH (ref 0.44–1.00)
GFR calc non Af Amer: 41 mL/min — ABNORMAL LOW (ref >60)
GFR, EST AFRICAN AMERICAN: 48 mL/min — AB (ref >60)
GLUCOSE: 176 mg/dL — AB (ref 65–99)
POTASSIUM: 4.7 mmol/L (ref 3.5–5.1)
SODIUM: 138 mmol/L (ref 135–145)
TOTAL PROTEIN: 6 g/dL — AB (ref 6.5–8.1)

## 2015-02-16 MED ORDER — INSULIN ASPART 100 UNIT/ML ~~LOC~~ SOLN
0.0000 [IU] | Freq: Three times a day (TID) | SUBCUTANEOUS | Status: DC
  Administered 2015-02-16: 3 [IU] via SUBCUTANEOUS
  Administered 2015-02-17: 2 [IU] via SUBCUTANEOUS
  Administered 2015-02-17: 3 [IU] via SUBCUTANEOUS

## 2015-02-16 MED ORDER — FUROSEMIDE 10 MG/ML IJ SOLN
80.0000 mg | Freq: Two times a day (BID) | INTRAMUSCULAR | Status: DC
  Administered 2015-02-16 – 2015-02-17 (×2): 80 mg via INTRAVENOUS
  Filled 2015-02-16 (×3): qty 8

## 2015-02-16 MED ORDER — PREDNISONE 20 MG PO TABS
40.0000 mg | ORAL_TABLET | Freq: Every day | ORAL | Status: DC
  Administered 2015-02-16 – 2015-02-18 (×3): 40 mg via ORAL
  Filled 2015-02-16 (×3): qty 2

## 2015-02-16 MED ORDER — INSULIN ASPART 100 UNIT/ML ~~LOC~~ SOLN
0.0000 [IU] | Freq: Every day | SUBCUTANEOUS | Status: DC
  Administered 2015-02-16: 3 [IU] via SUBCUTANEOUS

## 2015-02-16 NOTE — Care Management Note (Signed)
Case Management Note  Patient Details  Name: [name] MRN: [id number] Date of Birth: December 26, 1940  Subjective/Objective:       74yo F admitted from home with CHF. Multiple Co-morbidities.   Anticipate return to home.           Action/Plan:Will follow. No anticipated discharge needs.  Expected Discharge Date:   (unknown)               Expected Discharge Plan:  Home/Self Care  In-House Referral:     Discharge planning Services  CM Consult  Post Acute Care Choice:    Choice offered to:     DME Arranged:    DME Agency:     HH Arranged:    HH Agency:     Status of Service:  In process, will continue to follow  Medicare Important Message Given:    Date Medicare IM Given:    Medicare IM give by:    Date Additional Medicare IM Given:    Additional Medicare Important Message give by:     If discussed at [place] of Stay Meetings, dates discussed:    Additional Comments:  Dessa Phi, RN 02/16/2015, 2:09 PM

## 2015-02-16 NOTE — Progress Notes (Signed)
[facility] PROGRESS NOTE  [name] HAL:[id number] DOB: 1941/08/22 DOA: 02/15/2015 PCP: [name], MD  Brief narrative 74 year old female with history of nonischemic cardiomyopathy, chronic diastolic CHF, morbid obesity, COPD, depression, GERD, recent parathyroidectomy for primary hyperparathyroidism, tachybradycardia syndrome status post dual-chamber pacemaker,  A. fib, coronary artery disease, type 2 diabetes mellitus, peripheral vascular disease and obstructive sleep apnea who presented with progressive shortness of breath since 2 weeks with orthopnea for the past 3 days. Also reported nonproductive cough with wheezing. She also had an episode of nausea and vomiting one day prior to admission. Patient was started on Levaquin by her PCP on 5/17 but since he did not feel better she came to the ED. In the ED patient was found to be in acute CHF with elevated BNP of 1515 and vascular congestion on chest x-ray. She was admitted to telemetry for acute CHF and associated COPD exacerbation.   Assessment/Plan:  Principal problem Acute on chronic systolic CHF No clear etiology of symptoms. Patient reports that she was having progressive shortness of breath since her recent parathyroidectomy 2 weeks back. Baseline wt of 280 lbs ( was 289 lbs on admission) -She still has significant volume overload with bilateral leg edema and JVD. Will increase IV Lasix dose to 80 mg twice a day. Monitor daily weight and strict I/O. -Continue metoprolol and losartan. Continue Ranexa -Check 2-D echo.  Active problems COPD with exacerbation Started on IV Solu-Medrol. Her symptoms much improved. Will switch to oral prednisone with when necessary nebs and empiric Levaquin.   A. fib Rate controlled on metoprolol. Continue Eliquis. Sees Dr. Caryl Comes.  Hypothyroidism Continue Synthroid.  GERD Continue omeprazole.  Uncontrolled type 2 diabetes mellitus Continue home dose Lantus and sliding scale  insulin.    Coronary artery disease Continue beta blocker and statin  DVT prophylaxis: On Eliquis Diet: Heart healthy/diabetic  Code Status: Full code Family Communication: Son in law at bedside Disposition Plan: Home Possibly in the next 48 hours   Consultants:  None  Procedures:  2-D echo  Antibiotics:  Levaquin since 5/18  HPI/Subjective: Patient seen and examined. Reports her breathing to be better today. Denies further wheezing.  Objective: Filed Vitals:   02/16/15 1412  BP: 132/69  Pulse: 91  Temp: 98.6 F (37 C)  Resp: 20    Intake/Output Summary (Last 24 hours) at 02/16/15 1524 Last data filed at 02/16/15 1500  Gross per 24 hour  Intake  772.5 ml  Output   1250 ml  Net -477.5 ml   Filed Weights   02/15/15 1053 02/16/15 1123  Weight: 131.09 kg (289 lb) 128.005 kg (282 lb 3.2 oz)    Exam:   General:  Elderly obese female in no acute distress  HEENT: No pallor, moist oral mucosa, JVD +, supple neck  Chest: Diminished bibasilar breath sounds, scattered rhonchi  CVS: Normal S1 and S2, no murmurs rub or gallop  GI: Soft, nondistended, nontender, bowel sounds present  Musculoskeletal: 1+ pitting edema bilaterally  CNS: Alert and oriented  Data Reviewed: Basic Metabolic Panel:  Recent Labs Lab 02/15/15 1129 02/16/15 0510  NA 136 138  K 5.1 4.7  CL 102 99*  CO2 23 27  GLUCOSE 153* 176*  BUN 22* 25*  CREATININE 1.12* 1.25*  CALCIUM 10.2 10.0   Liver Function Tests:  Recent Labs Lab 02/15/15 1129 02/16/15 0510  AST 28 29  ALT 18 16  ALKPHOS 102 87  BILITOT 0.7 1.0  PROT 6.9 6.0*  ALBUMIN 3.5 2.9*  No results for input(s): LIPASE, AMYLASE in the last 168 hours. No results for input(s): AMMONIA in the last 168 hours. CBC:  Recent Labs Lab 02/15/15 1129 02/16/15 0510  WBC 9.2 5.9  NEUTROABS 7.3  --   HGB 17.0* 15.7*  HCT 52.4* 49.9*  MCV 98.1 98.8  PLT 318 281   Cardiac Enzymes:  Recent Labs Lab  02/15/15 1129  TROPONINI <0.03   BNP (last 3 results)  Recent Labs  02/15/15 1129  BNP 1515.5*    ProBNP (last 3 results) No results for input(s): PROBNP in the last 8760 hours.  CBG: No results for input(s): GLUCAP in the last 168 hours.  No results found for this or any previous visit (from the past 240 hour(s)).   Studies: Dg Chest 2 View  02/15/2015   CLINICAL DATA:  New shortness of breath with weakness. No other chest complaints.  EXAM: CHEST  2 VIEW  COMPARISON:  01/30/2015.  Also 04/25/2014.  FINDINGS: Cardiomegaly. Dual lead transvenous pacer. Mild vascular congestion. No overt failure or infiltrates. No definite osseous findings. Minor thoracic vertebral body irregularity appears similar to priors.  IMPRESSION: Stable cardiomegaly and mild vascular congestion.   Electronically Signed   By: Rolla Flatten M.D.   On: 02/15/2015 12:03    Scheduled Meds: . apixaban  5 mg Oral BID  . atorvastatin  80 mg Oral QHS  . furosemide  80 mg Intravenous Q12H  . guaiFENesin  600 mg Oral BID  . insulin glargine  30 Units Subcutaneous q morning - 10a  . ipratropium-albuterol  3 mL Nebulization TID  . levofloxacin  500 mg Oral Q24H  . levothyroxine  75 mcg Oral QAC breakfast  . losartan  50 mg Oral Daily  . metoprolol  100 mg Oral BID  . omeprazole  20 mg Oral Daily  . predniSONE  40 mg Oral Q breakfast  . ranolazine  500 mg Oral BID  . sodium chloride  3 mL Intravenous Q12H  . sulfaSALAzine  1,000 mg Oral BID   Continuous Infusions: . sodium chloride 10 mL/hr at 02/15/15 1633      Time spent: 35 minutes    Jaspreet Bodner, [place] [facility] Pager [phone] If 7PM-7AM, please contact night-coverage at [email or web address], password [facility] 02/16/2015, 3:24 PM  LOS: 1 day

## 2015-02-16 NOTE — Progress Notes (Signed)
  Echocardiogram 2D Echocardiogram has been performed.  Mahagony Grieb FRANCES 02/16/2015, 4:02 PM

## 2015-02-17 DIAGNOSIS — G4733 Obstructive sleep apnea (adult) (pediatric): Secondary | ICD-10-CM

## 2015-02-17 LAB — BASIC METABOLIC PANEL
Anion gap: 10 (ref 5–15)
BUN: 36 mg/dL — AB (ref 6–20)
CALCIUM: 10.1 mg/dL (ref 8.9–10.3)
CO2: 31 mmol/L (ref 22–32)
Chloride: 97 mmol/L — ABNORMAL LOW (ref 101–111)
Creatinine, Ser: 1.32 mg/dL — ABNORMAL HIGH (ref 0.44–1.00)
GFR calc Af Amer: 45 mL/min — ABNORMAL LOW (ref >60)
GFR calc non Af Amer: 39 mL/min — ABNORMAL LOW (ref >60)
GLUCOSE: 139 mg/dL — AB (ref 65–99)
Potassium: 4.3 mmol/L (ref 3.5–5.1)
Sodium: 138 mmol/L (ref 135–145)

## 2015-02-17 LAB — GLUCOSE, CAPILLARY
GLUCOSE-CAPILLARY: 185 mg/dL — AB (ref 65–99)
Glucose-Capillary: 110 mg/dL — ABNORMAL HIGH (ref 65–99)
Glucose-Capillary: 148 mg/dL — ABNORMAL HIGH (ref 65–99)
Glucose-Capillary: 177 mg/dL — ABNORMAL HIGH (ref 65–99)

## 2015-02-17 MED ORDER — FUROSEMIDE 10 MG/ML IJ SOLN
40.0000 mg | Freq: Two times a day (BID) | INTRAMUSCULAR | Status: DC
  Administered 2015-02-17 – 2015-02-18 (×2): 40 mg via INTRAVENOUS
  Filled 2015-02-17 (×2): qty 4

## 2015-02-17 NOTE — Evaluation (Signed)
Physical Therapy Evaluation Patient Details Name: [name] MRN: [id number] DOB: 1941-06-30 Today's Date: 02/17/2015   History of Present Illness  74 year old female adm with SOB x 3 days, CHF; past medical history: Nonischemic cardiomyopathy, Chronic, diastolic heart failure, Morbid obesity, Hyperlipidemia, Tachy-brady syndrome,  Osteoporosis, Depression; GERD, Crohn's disease; Hypothyroidism; Seasonal allergies; Pacemaker;  CAD, Atrial fib,  Diabetes mellitus, COPD, Gout,  Peripheral vascular disease, Obstructive sleep apnea, SOB   Clinical Impression   Pt admitted with above diagnosis. Pt currently with functional limitations due to the deficits listed below (see PT Problem List).  Pt will benefit from skilled PT to increase their independence and safety with mobility to allow discharge to the venue listed below.   Pt reports to PT that mobility and ADLs have been more of a struggle at home in recent months; At time of this eval the pt can only amb 8', she is quite deconditioned and would benefit from SNF level therapies.  Follow Up Recommendations SNF    Equipment Recommendations  None recommended by PT    Recommendations for Other Services       Precautions / Restrictions Precautions Precautions: Fall      Mobility  Bed Mobility Overal bed mobility: Needs Assistance Bed Mobility: Supine to Sit     Supine to sit: Supervision;HOB elevated     General bed mobility comments: incr time required  Transfers Overall transfer level: Needs assistance Equipment used: Rolling walker (2 wheeled) Transfers: Sit to/from Stand Sit to Stand: Min assist         General transfer comment: cues for hand placment, safety; assist to control descent and to stabilize chair as pt sat with little warning ( +2 would be helpful next visit)  Ambulation/Gait Ambulation/Gait assistance: Min assist Ambulation Distance (Feet): 9 Feet Assistive device: Rolling walker (2 wheeled) Gait  Pattern/deviations: Trunk flexed;Wide base of support;Decreased stride length Gait velocity: O2 sats 83% on RA during amb  (although wave pattern was questionable); O2 sats briefly 92% after amb (?); incr to 94-96% consisitently with 3L O2; Gait velocity interpretation: Below normal speed for age/gender General Gait Details: cues for RW safety, breathing  Stairs            Wheelchair Mobility    Modified Rankin (Stroke Patients Only)       Balance Overall balance assessment: Needs assistance Sitting-balance support: No upper extremity supported;Feet supported Sitting balance-Leahy Scale: Fair       Standing balance-Leahy Scale: Poor Standing balance comment: requires support of RW adn very poor standing tolerance (pt can only stand for ~10 sec)                             Pertinent Vitals/Pain Pain Assessment: No/denies pain    Home Living Family/patient expects to be discharged to:: Private residence Living Arrangements: Alone   Type of Home: House Home Access: Stairs to enter   [employer] of Steps: 1  Home Layout: One level Home Equipment: Environmental consultant - 2 wheels;Walker - 4 wheels;Tub bench;Shower seat      Prior Function Level of Independence: Independent;Independent with assistive device(s)         Comments: pt reports difficulty with standing (limited tolerance); pt has tub bench and another shower seat that she uses; pt uses stool in the kitchen d/t limited standing tolerance; pt has comfort ht toilets     Hand Dominance        Extremity/Trunk Assessment  Upper Extremity Assessment: Generalized weakness           Lower Extremity Assessment: Generalized weakness         Communication   Communication: HOH  Cognition Arousal/Alertness: Awake/alert Behavior During Therapy: WFL for tasks assessed/performed Overall Cognitive Status: Within Functional Limits for tasks assessed                      General  Comments      Exercises        Assessment/Plan    PT Assessment Patient needs continued PT services  PT Diagnosis Difficulty walking;Generalized weakness   PT Problem List Decreased strength;Decreased activity tolerance;Decreased mobility;Obesity;Decreased knowledge of use of DME  PT Treatment Interventions DME instruction;Gait training;Functional mobility training;Therapeutic activities;Patient/family education;Therapeutic exercise   PT Goals (Current goals can be found in the Care Plan section) Acute Rehab PT Goals Patient Stated Goal: to get better PT Goal Formulation: With patient Time For Goal Achievement: 03/03/15 Potential to Achieve Goals: Good    Frequency Min 3X/week   Barriers to discharge        Co-evaluation               End of Session Equipment Utilized During Treatment: Gait belt Activity Tolerance: Patient tolerated treatment well Patient left: in chair;with call bell/phone within reach Nurse Communication: Mobility status         Time: 0165-5374 PT Time Calculation (min) (ACUTE ONLY): 40 min   Charges:   PT Evaluation $Initial PT Evaluation Tier I: 1 Procedure PT Treatments $Gait Training: 8-22 mins $Therapeutic Activity: 8-22 mins   PT G Codes:        Adama Ferber Feb 28, 2015, 1:57 PM

## 2015-02-17 NOTE — Progress Notes (Signed)
[facility] PROGRESS NOTE  [name] GHW:[id number] DOB: 1941/02/07 DOA: 02/15/2015 PCP: [name] Amel, MD  Brief narrative 74 year old female with history of nonischemic cardiomyopathy, chronic diastolic CHF, morbid obesity, COPD, depression, GERD, recent parathyroidectomy for primary hyperparathyroidism, tachybradycardia syndrome status post dual-chamber pacemaker,  A. fib, coronary artery disease, type 2 diabetes mellitus, peripheral vascular disease and obstructive sleep apnea who presented with progressive shortness of breath since 2 weeks with orthopnea for the past 3 days. Also reported nonproductive cough with wheezing. She also had an episode of nausea and vomiting one day prior to admission. Patient was started on Levaquin by her PCP on 5/17 but since he did not feel better she came to the ED. In the ED patient was found to be in acute CHF with elevated BNP of 1515 and vascular congestion on chest x-ray. She was admitted to telemetry for respiratory failure secondary to CHF, OHS and associated COPD exacerbation.   Assessment/Plan:  Principal problem Acute on chronic systolic CHF Likely a combination of right heart failure and severe OHS -Diuresing well with IV Lasix. Will reduce dose given worsening renal function. Patient reports being on CPAP but has not using it for several years now. Her PA pressure is markedly elevated (74 mmHg and has right ventricular dysfunction on 2-D echo). I have informed pulmonary consult who will arrange for outpatient follow-up and have a sleep study. Patient followed with Dr. Gwenette [name] and will now be assigned a new pulmonologist. -Continue metoprolol and losartan. Continue Ranexa -Negative balance of 2.2 L. Continue strict I/O and monitor daily weight.  Active problems COPD with exacerbation Improved. Continue by mouth prednisone and as needed. Continue empiric Levaquin.   A. fib Rate controlled on metoprolol. Continue Eliquis. Sees Dr.  Caryl [name].  Hypothyroidism Continue Synthroid.  GERD Continue omeprazole.  Uncontrolled type 2 diabetes mellitus Stable. Continue home dose Lantus and sliding scale insulin.    Coronary artery disease Continue beta blocker and statin  DVT prophylaxis: On Eliquis  Diet: Heart healthy/diabetic  Code Status: Full code Family Communication: Son in law at bedside Disposition Plan: Home Possibly tomorrow if improving   Consultants:  None  Procedures:  2-D echo  Antibiotics:  Levaquin since 5/18  HPI/Subjective: Patient seen and examined. Symptoms much improved.  Objective: Filed Vitals:   02/17/15 0442  BP: 115/83  Pulse: 95  Temp: 97.7 F (36.5 C)  Resp: 20    Intake/Output Summary (Last 24 hours) at 02/17/15 1202 Last data filed at 02/17/15 0445  Gross per 24 hour  Intake 122.17 ml  Output   1800 ml  Net -1677.83 ml   Filed Weights   02/15/15 1053 02/16/15 1123 02/17/15 0442  Weight: 131.09 kg (289 lb) 128.005 kg (282 lb 3.2 oz) 128.096 kg (282 lb 6.4 oz)    Exam:   General:  Elderly obese female in no acute distress  HEENT: No pallor, moist oral mucosa, JVD +, supple neck  Chest: Diminished bibasilar breath sounds, scattered rhonchi  CVS: Normal S1 and S2, no murmurs rub or gallop  GI: Soft, nondistended, nontender, bowel sounds present  Musculoskeletal: 1+ pitting edema bilaterally  CNS: Alert and oriented  Data Reviewed: Basic Metabolic Panel:  Recent Labs Lab 02/15/15 1129 02/16/15 0510 02/17/15 0353  NA 136 138 138  K 5.1 4.7 4.3  CL 102 99* 97*  CO2 23 27 31   GLUCOSE 153* 176* 139*  BUN 22* 25* 36*  CREATININE 1.12* 1.25* 1.32*  CALCIUM 10.2 10.0 10.1   Liver Function  Tests:  Recent Labs Lab 02/15/15 1129 02/16/15 0510  AST 28 29  ALT 18 16  ALKPHOS 102 87  BILITOT 0.7 1.0  PROT 6.9 6.0*  ALBUMIN 3.5 2.9*   No results for input(s): LIPASE, AMYLASE in the last 168 hours. No results for input(s): AMMONIA in the  last 168 hours. CBC:  Recent Labs Lab 02/15/15 1129 02/16/15 0510  WBC 9.2 5.9  NEUTROABS 7.3  --   HGB 17.0* 15.7*  HCT 52.4* 49.9*  MCV 98.1 98.8  PLT 318 281   Cardiac Enzymes:  Recent Labs Lab 02/15/15 1129  TROPONINI <0.03   BNP (last 3 results)  Recent Labs  02/15/15 1129  BNP 1515.5*    ProBNP (last 3 results) No results for input(s): PROBNP in the last 8760 hours.  CBG:  Recent Labs Lab 02/16/15 1650 02/16/15 2113 02/17/15 0738 02/17/15 1154  GLUCAP 197* 260* 110* 148*    No results found for this or any previous visit (from the past 240 hour(s)).   Studies: No results found.  Scheduled Meds: . apixaban  5 mg Oral BID  . atorvastatin  80 mg Oral QHS  . furosemide  40 mg Intravenous Q12H  . guaiFENesin  600 mg Oral BID  . insulin aspart  0-15 Units Subcutaneous TID WC  . insulin aspart  0-5 Units Subcutaneous QHS  . insulin glargine  30 Units Subcutaneous q morning - 10a  . ipratropium-albuterol  3 mL Nebulization TID  . levofloxacin  500 mg Oral Q24H  . levothyroxine  75 mcg Oral QAC breakfast  . losartan  50 mg Oral Daily  . metoprolol  100 mg Oral BID  . omeprazole  20 mg Oral Daily  . predniSONE  40 mg Oral Q breakfast  . ranolazine  500 mg Oral BID  . sodium chloride  3 mL Intravenous Q12H  . sulfaSALAzine  1,000 mg Oral BID   Continuous Infusions: . sodium chloride 10 mL/hr at 02/15/15 1633      Time spent: 35 minutes    [name], [facility] Pager [phone] If 7PM-7AM, please contact night-coverage at [email or web address], password [facility] 02/17/2015, 12:02 PM  LOS: 2 days

## 2015-02-17 NOTE — Progress Notes (Signed)
SATURATION QUALIFICATIONS: (This note is used to comply with regulatory documentation for home oxygen)  Patient Saturations on Room Air at Rest = 86% while at rest sitting up in the chair (pt. Dyspneic)  Patient Saturations on Room Air while Ambulating = %  Patient Saturations on  Liters of oxygen while Ambulating = %  Please briefly explain why patient needs home oxygen:

## 2015-02-17 NOTE — Care Management Note (Signed)
Case Management Note  Patient Details  Name: [name] MRN: [id number] Date of Birth: 07-28-1941  Subjective/Objective: PT recommending SNF at discharge.                 Action/Plan: Made CSW aware. Will continue to follow   Expected Discharge Date:               Expected Discharge Plan:  [place] (PT recommends SNF)  In-House Referral:  Clinical Social Work (Made CSW aware of possible d/c 24H)  Discharge planning Services  CM Consult  Post Acute Care Choice:    Choice offered to:  Patient, Adult Children (Son at bedside)  DME Arranged:    DME Agency:     HH Arranged:    [place] Agency:     Status of Service:  In process, will continue to follow  Medicare Important Message Given:  Yes Date Medicare IM Given:  02/17/15 Medicare IM give by:  Dessa Phi Date Additional Medicare IM Given:    Additional Medicare Important Message give by:     If discussed at [place] of Stay Meetings, dates discussed:    Additional Comments:  Dessa Phi, RN 02/17/2015, 1:14 PM

## 2015-02-17 NOTE — Care Management Note (Signed)
Case Management Note  Patient Details  Name: [name] MRN: [id number] Date of Birth: [birth year]  Subjective/Objective: pt on O2 3L. May need Home O2. Will check if qualifies.    PT consult placed. Pt has RW, W/C, shower chair         Action/Plan: Will continue to follow for [facility] agency choice.   Expected Discharge Date:                Expected Discharge Plan:  [place]  In-House Referral:     Discharge planning Services  CM Consult (PT consult placed and o2 sats to be checked.)  Post Acute Care Choice:    Choice offered to:  Patient, Adult Children (Son at bedside)  DME Arranged:    DME Agency:     HH Arranged:    [place] Agency:     Status of Service:  In process, will continue to follow  Medicare Important Message Given:  Yes Date Medicare IM Given:  02/17/15 Medicare IM give by:  Dessa Phi Date Additional Medicare IM Given:    Additional Medicare Important Message give by:     If discussed at [place] of Stay Meetings, dates discussed:    Additional Comments:  Dessa Phi, RN 02/17/2015, 10:03 AM

## 2015-02-17 NOTE — Clinical Social Work Placement (Signed)
   CLINICAL SOCIAL WORK PLACEMENT  NOTE  Date:  02/17/2015  Patient Details  Name: [name] MRN: [id number] Date of Birth: 05-Oct-1940  Clinical Social Work is seeking post-discharge placement for this patient at the [place] level of care (*CSW will initial, date and re-position this form in  chart as items are completed):  Yes   Patient/family provided with [place] Work Department's list of facilities offering this level of care within the geographic area requested by the patient (or if unable, by the patient's family).  Yes   Patient/family informed of their freedom to choose among providers that offer the needed level of care, that participate in Medicare, Medicaid or managed care program needed by the patient, have an available bed and are willing to accept the patient.  Yes   Patient/family informed of [facility]'s ownership interest in [facility] and [facility], as well as of the fact that they are under no obligation to receive care at these facilities.  PASRR submitted to EDS on 02/17/15     PASRR number received on 02/17/15     Existing PASRR number confirmed on       FL2 transmitted to all facilities in geographic area requested by pt/family on 02/17/15     FL2 transmitted to all facilities within larger geographic area on       Patient informed that his/her managed care company has contracts with or will negotiate with certain facilities, including the following:        Yes   Patient/family informed of bed offers received.  Patient chooses bed at       Physician recommends and patient chooses bed at      Patient to be transferred to   on  .  Patient to be transferred to facility by       Patient family notified on   of transfer.  Name of family member notified:        PHYSICIAN       Additional Comment:    _______________________________________________ Standley Brooking, LCSW 02/17/2015, 4:02 PM

## 2015-02-17 NOTE — Clinical Social Work Note (Signed)
Clinical Social Work Assessment  Patient Details  Name: [name] MRN: [id number] Date of Birth: 07/12/1941  Date of referral:  02/17/15               Reason for consult:  Facility Placement                Permission sought to share information with:  Chartered certified accountant granted to share information::  Yes, Verbal Permission Granted  Name::        Agency::     Relationship::     Contact Information:     Housing/Transportation Living arrangements for the past 2 months:  Single Family Home Source of Information:  Patient Patient Interpreter Needed:  None Criminal Activity/Legal Involvement Pertinent to Current Situation/Hospitalization:  No - Comment as needed Significant Relationships:  Adult Children Lives with:  Self Do you feel safe going back to the place where you live?  No Need for family participation in patient care:  Yes (Comment)  Care giving concerns:  CSW received consult for SNF placement.    Social Worker assessment / plan:  CSW met with patient at bedside & provided SNF list and bed offers.   Employment status:  Retired Nurse, adult PT Recommendations:  [employer] / Referral to community resources:  [place]  Patient/Family's Response to care:  Patient is agreeable with plan for SNF, informed CSW that she lives alone in her townhouse and her daughter [name] and her husband live in the area as well.   Patient/Family's Understanding of and Emotional Response to Diagnosis, Current Treatment, and Prognosis:  Patient is concerned about the use of oxygen at home and her cough which she feels has gotten worse since she's been admitted.   Emotional Assessment Appearance:  Appears stated age Attitude/Demeanor/Rapport:    Affect (typically observed):  Pleasant, Accepting, Adaptable Orientation:  Oriented to Self, Oriented to Place, Oriented to  Time, Oriented to  Situation Alcohol / Substance use:    Psych involvement (Current and /or in the community):  No (Comment)  Discharge Needs  Concerns to be addressed:    Readmission within the last 30 days:    Current discharge risk:    Barriers to Discharge:      Standley Brooking, LCSW 02/17/2015, 4:00 PM

## 2015-02-18 DIAGNOSIS — J441 Chronic obstructive pulmonary disease with (acute) exacerbation: Secondary | ICD-10-CM | POA: Diagnosis present

## 2015-02-18 DIAGNOSIS — I2729 Other secondary pulmonary hypertension: Secondary | ICD-10-CM

## 2015-02-18 DIAGNOSIS — I5041 Acute combined systolic (congestive) and diastolic (congestive) heart failure: Secondary | ICD-10-CM

## 2015-02-18 DIAGNOSIS — J9621 Acute and chronic respiratory failure with hypoxia: Secondary | ICD-10-CM | POA: Diagnosis present

## 2015-02-18 DIAGNOSIS — G4733 Obstructive sleep apnea (adult) (pediatric): Secondary | ICD-10-CM | POA: Diagnosis present

## 2015-02-18 DIAGNOSIS — G478 Other sleep disorders: Secondary | ICD-10-CM | POA: Diagnosis present

## 2015-02-18 LAB — GLUCOSE, CAPILLARY
GLUCOSE-CAPILLARY: 93 mg/dL (ref 65–99)
GLUCOSE-CAPILLARY: 122 mg/dL — AB (ref 65–99)

## 2015-02-18 MED ORDER — IPRATROPIUM-ALBUTEROL 0.5-2.5 (3) MG/3ML IN SOLN
3.0000 mL | Freq: Two times a day (BID) | RESPIRATORY_TRACT | Status: DC
  Administered 2015-02-18: 3 mL via RESPIRATORY_TRACT
  Filled 2015-02-18: qty 3

## 2015-02-18 MED ORDER — LEVOFLOXACIN 750 MG PO TABS: 750.0000 mg | ORAL_TABLET | Freq: Every day | ORAL | Status: AC

## 2015-02-18 MED ORDER — FUROSEMIDE 40 MG PO TABS: 80.0000 mg | ORAL_TABLET | Freq: Two times a day (BID) | ORAL | Status: DC

## 2015-02-18 MED ORDER — IPRATROPIUM-ALBUTEROL 0.5-2.5 (3) MG/3ML IN SOLN: 3.0000 mL | Freq: Four times a day (QID) | RESPIRATORY_TRACT | Status: DC | PRN

## 2015-02-18 MED ORDER — PREDNISONE 20 MG PO TABS: 40.0000 mg | ORAL_TABLET | Freq: Every day | ORAL | Status: AC

## 2015-02-18 MED ORDER — GUAIFENESIN ER 600 MG PO TB12: 600.0000 mg | ORAL_TABLET | Freq: Two times a day (BID) | ORAL | Status: DC

## 2015-02-18 NOTE — Progress Notes (Signed)
Medicare Important Message given? YES  Date Medicare IM given:  02/18/2015 Medicare IM given by: Jonnie Finner

## 2015-02-18 NOTE — Progress Notes (Signed)
Pt to be transported to [facility] at 1pm by ptar as agreed upon by pt, rn, and facility. CSW provided pt with supportive counseling as she expressed concerns about short term rehab, expectations, and environment. Pt and pt family thanked csw for concern support.   Belia Heman, [place]  Covering weekend csw

## 2015-02-18 NOTE — Clinical Social Work Placement (Signed)
   CLINICAL SOCIAL WORK PLACEMENT  NOTE  Date:  02/18/2015  Patient Details  Name: [name] MRN: [id number] Date of Birth: April 01, 1941  Clinical Social Work is seeking post-discharge placement for this patient at the [place] level of care (*CSW will initial, date and re-position this form in  chart as items are completed):  Yes   Patient/family provided with [place] Work Department's list of facilities offering this level of care within the geographic area requested by the patient (or if unable, by the patient's family).  Yes   Patient/family informed of their freedom to choose among providers that offer the needed level of care, that participate in Medicare, Medicaid or managed care program needed by the patient, have an available bed and are willing to accept the patient.  Yes   Patient/family informed of [facility]'s ownership interest in [facility] and [facility], as well as of the fact that they are under no obligation to receive care at these facilities.  PASRR submitted to EDS on 02/17/15     PASRR number received on 02/17/15     Existing PASRR number confirmed on       FL2 transmitted to all facilities in geographic area requested by pt/family on 02/17/15     FL2 transmitted to all facilities within larger geographic area on       Patient informed that his/her managed care company has contracts with or will negotiate with certain facilities, including the following:        Yes   Patient/family informed of bed offers received.  Patient chooses bed at  [facility]      Physician recommends and patient chooses bed at      Patient to be transferred to  [facility]  on  02/18/2015.  Patient to be transferred to facility by  [facility]     Patient family notified on  5/21  of transfer.  Name of family member notified:   Pt son in law at bedside.      PHYSICIAN       Additional Comment:     _______________________________________________ Edson Snowball, LCSW 02/18/2015, 11:50 AM

## 2015-02-18 NOTE — Discharge Instructions (Signed)
Sleep Apnea  Sleep apnea is disorder that affects a person's sleep. A person with sleep apnea has abnormal pauses in their breathing when they sleep. It is hard for them to get a good sleep. This makes a person tired during the day. It also can lead to other physical problems. There are three types of sleep apnea. One type is when breathing stops for a short time because your airway is blocked (obstructive sleep apnea). Another type is when the brain sometimes fails to give the normal signal to breathe to the muscles that control your breathing (central sleep apnea). The third type is a combination of the other two types.  HOME CARE  · Do not sleep on your back. Try to sleep on your side.  · Take all medicine as told by your doctor.  · Avoid alcohol, calming medicines (sedatives), and depressant drugs.  · Try to lose weight if you are overweight. Talk to your doctor about a healthy weight goal.  Your doctor may have you use a device that helps to open your airway. It can help you get the air that you need. It is called a positive airway pressure (PAP) device. There are three types of PAP devices:  · Continuous positive airway pressure (CPAP) device.  · Nasal expiratory positive airway pressure (EPAP) device.  · Bilevel positive airway pressure (BPAP) device.  MAKE SURE YOU:  · Understand these instructions.  · Will watch your condition.  · Will get help right away if you are not doing well or get worse.  Document Released: 06/25/2008 Document Revised: 09/02/2012 Document Reviewed: 01/18/2012  ExitCare® Patient Information ©2015 [employer]. This information is not intended to replace advice given to you by your health care provider. Make sure you discuss any questions you have with your health care provider.

## 2015-02-18 NOTE — Progress Notes (Signed)
Patient has expressed her disappointment and frustration with using a nebulizer. She states she gets no symptom relief from its use and asks if there is a need to use it as often as she has been. Education provided, but patient reluctant to continue nebulizer use 3 times every day. RT protocol assessment completed. Order changed to BID, per patient wishes. The patient is aware that she may request a BD if she feels it is warranted and there is a prn order available for such instances.

## 2015-02-18 NOTE — Progress Notes (Signed)
Report given to Lorenso Courier, RN at [facility].  Will get patient ready to discharge.

## 2015-02-18 NOTE — Discharge Summary (Signed)
Physician Discharge Summary  [name] VPX:[id number] DOB: 09/05/1941 DOA: 02/15/2015  PCP: [name], MD  Admit date: 02/15/2015 Discharge date: 02/18/2015  Time spent:35 minutes  Recommendations for Outpatient Follow-up:  #1 Discharged to skilled nursing facility. #2 patient being discharged on 2 L oxygen via nasal cannula (new). Needs daily weight and I/O monitoring her CHF symptoms. #3. Patient has outpatient follow-up with pulmonary and needs arrangement for outpatient sleep study.  Discharge Diagnoses:  Principal Problem:   Acute combined systolic and diastolic CHF, NYHA class 3  Active Problems:   Acute on chronic respiratory failure with hypoxia  COPD exacerbation   Started sleep apnea/OHS Severe pulmonary hypertension   Essential hypertension   Atrial fibrillation   CARDIAC PACEMAKER IN SITU-MEDTRONIC ADAPTA L   Long term current use of anticoagulant therapy   Diabetes mellitus type 2     Discharge Condition: Fair  Diet recommendation: Heart healthy/diabetic  Code status: Full code  Filed Weights   02/16/15 1123 02/17/15 0442 02/18/15 0520  Weight: 128.005 kg (282 lb 3.2 oz) 128.096 kg (282 lb 6.4 oz) 127.8 kg (281 lb 12 oz)    History of present illness:  74 year old female with history of nonischemic cardiomyopathy, chronic diastolic CHF, morbid obesity, COPD, depression, GERD,  recent parathyroidectomy for primary hyperparathyroidism, tachybradycardia syndrome status post dual-chamber pacemaker, A. fib, coronary artery disease, type 2 diabetes mellitus, peripheral vascular disease and obstructive sleep apnea (not using CPAP) who presented with progressive shortness of breath since 2 weeks with orthopnea for the past 3 days. Also reported nonproductive cough with wheezing. She also had an episode of nausea and vomiting one day prior to admission. Patient was started on Levaquin by her PCP on 5/17 but since he did not feel better she came to the ED. In the  ED patient was found to be in acute CHF with elevated BNP of 1515 and vascular congestion on chest x-ray. She was admitted to telemetry for respiratory failure secondary to CHF, OHS and associated COPD exacerbation.  Hospital Course:  Principal problem Acute on chronic hypoxic respiratory failure Secondary to combination of acute on chronic combined systolic and diastolic CHF, severe OHS and COPD exacerbation as outlined below. Patient desaturating to 80% on room air and will require 2 L O2 via nasal cannula continuously.   Acute on chronic systolic CHF 2-D echo showing mildly depressed LV function. Moderate to severely reduced right ventricle and severely elevated PA pressure of 73 mmHg suggestive of cor pulmonale. Severe OSA/OHS also contributing to right heart failure. -Diuresing well with IV Lasix. (Negative balance of 4.2 L since admission). Weight has also reduced by over 5 pounds. I will discharge her on oral Lasix 80 mg twice a day. Continue potassium supplements. -Continue metoprolol and losartan. Continue Ranexa. Please monitor renal function as outpatient.  Severe OSA/ OHS -Sleep study done several years ago and recommended for CPAP but patient noncompliant. -She'll follows with Dr. Gwenette Greet for her COPD. I have arranged follow-up with Dr. Elsworth Soho and needs to have an outpatient sleep study. She should also follow with her cardiologist Dr. Caryl Comes.  Active problems COPD with exacerbation Improved. Continue oral prednisone for 5 more days. Continue home inhalers. Will add when necessary DuoNeb. Patient will need 2 L O2 via nasal cannula continuously.   A. fib Rate controlled on metoprolol. Continue Eliquis. Sees Dr. Caryl Comes.  Hypothyroidism Continue Synthroid.  GERD Continue omeprazole.  Uncontrolled type 2 diabetes mellitus Blood glucose stable on home dose of Lantus. Monitor with sliding  scale insulin at the facility.    Coronary artery disease Continue beta blocker and  statin  Hx of  ulcerative colitis Continue sulfasalazine  Morbid obesity Counseled on diet and reducing weight.   Family Communication: Son in Sports coach at bedside Disposition Plan: [facility] facility   Consultants:  None  Procedures:  2-D echo  Antibiotics:  Levaquin since 5/18--complete on 5/21  Discharge Exam: Filed Vitals:   02/18/15 0520  BP: 128/70  Pulse: 92  Temp: 98.3 F (36.8 C)  Resp: 18     General: Elderly obese female in no acute distress  HEENT: No pallor, moist oral mucosa, no JVD, supple neck  Chest: Improved bibasilar breath sounds, scattered rhonchi  CVS: Normal S1 and S2, no murmurs rub or gallop  GI: Soft, nondistended, nontender, bowel sounds present  Musculoskeletal:  Trace  pitting edema bilaterally  CNS: Alert and oriented   Discharge Instructions    Current Discharge Medication List    START taking these medications   Details  guaiFENesin (MUCINEX) 600 MG 12 hr tablet Take 1 tablet (600 mg total) by mouth 2 (two) times daily. Qty: 16 tablet, Refills: 0    ipratropium-albuterol (DUONEB) 0.5-2.5 (3) MG/3ML SOLN Take 3 mLs by nebulization every 6 (six) hours as needed (wheezing and or shortness of breath). Qty: 360 mL, Refills: 0    predniSONE (DELTASONE) 20 MG tablet Take 2 tablets (40 mg total) by mouth daily with breakfast. Qty: 10 tablet, Refills: 0      CONTINUE these medications which have CHANGED   Details  furosemide (LASIX) 40 MG tablet Take 2 tablets (80 mg total) by mouth 2 (two) times daily. Qty: 60 tablet, Refills: 0    levofloxacin (LEVAQUIN) 750 MG tablet Take 1 tablet (750 mg total) by mouth daily. Qty: 2 tablet, Refills: 0      CONTINUE these medications which have NOT CHANGED   Details  albuterol (PROVENTIL HFA;VENTOLIN HFA) 108 (90 BASE) MCG/ACT inhaler Inhale 2 puffs into the lungs every 6 (six) hours as needed for wheezing or shortness of breath.    apixaban (ELIQUIS) 5 MG TABS tablet Take 1 tablet  (5 mg total) by mouth 2 (two) times daily. Qty: 180 tablet, Refills: 3    atorvastatin (LIPITOR) 80 MG tablet Take 80 mg by mouth at bedtime.      Fiber CAPS Take by mouth 2 (two) times daily.     Fluticasone-Salmeterol (ADVAIR) 250-50 MCG/DOSE AEPB Inhale 1 puff into the lungs every 12 (twelve) hours. Qty: 60 each, Refills: 6    insulin glargine (LANTUS) 100 UNIT/ML injection Inject 30 Units into the skin every morning.     levothyroxine (SYNTHROID, LEVOTHROID) 75 MCG tablet Take 75 mcg by mouth daily.    loratadine (CLARITIN) 10 MG tablet Take 10 mg by mouth as needed.     losartan (COZAAR) 50 MG tablet Take 50 mg by mouth daily.    metoprolol (LOPRESSOR) 100 MG tablet TAKE 1 TABLET (100 MG TOTAL) BY MOUTH 2 (TWO) TIMES DAILY. Qty: 180 tablet, Refills: 3    Multiple Vitamins-Minerals (MULTIVITAMIN WITH MINERALS) tablet Take 1 tablet by mouth daily.    nitroGLYCERIN (NITROSTAT) 0.4 MG SL tablet Place 1 tablet (0.4 mg total) under the tongue every 5 (five) minutes as needed for chest pain. Qty: 25 tablet, Refills: 1   Associated Diagnoses: Atherosclerosis of native coronary artery of native heart with angina pectoris; Chronic atrial fibrillation    nystatin cream (MYCOSTATIN) Apply 1 application topically daily as  needed.    omeprazole (PRILOSEC OTC) 20 MG tablet Take 20 mg by mouth daily.      potassium chloride SA (KLOR-CON M20) 20 MEQ tablet Take 1 tablet (20 mEq total) by mouth 2 (two) times daily. Qty: 180 tablet, Refills: 3    PROLENSA 0.07 % SOLN Place 1 drop into the right eye.    RANEXA 500 MG 12 hr tablet TAKE 1 TABLET (500 MG TOTAL) BY MOUTH 2 (TWO) TIMES DAILY. Qty: 180 tablet, Refills: 2    sulfaSALAzine (AZULFIDINE) 500 MG tablet Take 1,000 mg by mouth 2 (two) times daily.     tiotropium (SPIRIVA) 18 MCG inhalation capsule Place 18 mcg into inhaler and inhale daily.    B-D INS SYRINGE 0.5CC/31GX5/16 31G X 5/16" 0.5 ML MISC as directed.    FREESTYLE LITE test  strip as directed.    Lancets (FREESTYLE) lancets as directed.      STOP taking these medications     oxyCODONE (OXY IR/ROXICODONE) 5 MG immediate release tablet        No Known Allergies Follow-up Information    Follow up with Rigoberto Noel., MD On 04/14/2015.   Specialty:  Pulmonary Disease   Why:  945am   Contact information:   [address] [place] [address] [phone]       Follow up with PARRETT,TAMMY, NP On 02/24/2015.   Specialty:  Nurse Practitioner   Why:  4pm    Contact information:   [address] [place] [address] [phone]        The results of significant diagnostics from this hospitalization (including imaging, microbiology, ancillary and laboratory) are listed below for reference.    Significant Diagnostic Studies: Dg Chest 2 View  02/15/2015   CLINICAL DATA:  New shortness of breath with weakness. No other chest complaints.  EXAM: CHEST  2 VIEW  COMPARISON:  01/30/2015.  Also 04/25/2014.  FINDINGS: Cardiomegaly. Dual lead transvenous pacer. Mild vascular congestion. No overt failure or infiltrates. No definite osseous findings. Minor thoracic vertebral body irregularity appears similar to priors.  IMPRESSION: Stable cardiomegaly and mild vascular congestion.   Electronically Signed   By: Rolla Flatten M.D.   On: 02/15/2015 12:03   Dg Chest Port 1 View  01/30/2015   CLINICAL DATA:  Oxygen desaturation after parathyroidectomy today  EXAM: PORTABLE CHEST - 1 VIEW  COMPARISON:  04/25/2014  FINDINGS: Cardiomegaly is noted. Three leads cardiac pacemaker unchanged in position. Central mild vascular congestion without pulmonary edema. Mild basilar atelectasis. No segmental infiltrate  IMPRESSION: Cardiomegaly. Central mild vascular congestion without pulmonary edema. Mild basilar atelectasis. No segmental infiltrate.   Electronically Signed   By: Lahoma Crocker M.D.   On: 01/30/2015 15:43    Microbiology: No results found for this or any previous visit  (from the past 240 hour(s)).   Labs: Basic Metabolic Panel:  Recent Labs Lab 02/15/15 1129 02/16/15 0510 02/17/15 0353  NA 136 138 138  K 5.1 4.7 4.3  CL 102 99* 97*  CO2 23 27 31   GLUCOSE 153* 176* 139*  BUN 22* 25* 36*  CREATININE 1.12* 1.25* 1.32*  CALCIUM 10.2 10.0 10.1   Liver Function Tests:  Recent Labs Lab 02/15/15 1129 02/16/15 0510  AST 28 29  ALT 18 16  ALKPHOS 102 87  BILITOT 0.7 1.0  PROT 6.9 6.0*  ALBUMIN 3.5 2.9*   No results for input(s): LIPASE, AMYLASE in the last 168 hours. No results for input(s): AMMONIA in the last  168 hours. CBC:  Recent Labs Lab 02/15/15 1129 02/16/15 0510  WBC 9.2 5.9  NEUTROABS 7.3  --   HGB 17.0* 15.7*  HCT 52.4* 49.9*  MCV 98.1 98.8  PLT 318 281   Cardiac Enzymes:  Recent Labs Lab 02/15/15 1129  TROPONINI <0.03   BNP: BNP (last 3 results)  Recent Labs  02/15/15 1129  BNP 1515.5*    ProBNP (last 3 results) No results for input(s): PROBNP in the last 8760 hours.  CBG:  Recent Labs Lab 02/17/15 0738 02/17/15 1154 02/17/15 1641 02/17/15 2138 02/18/15 0741  GLUCAP 110* 148* 185* 177* 93       Signed:  Kemiyah Tarazon  [facility] 02/18/2015, 9:56 AM

## 2015-02-24 ENCOUNTER — Telehealth: Payer: Self-pay | Admitting: Internal Medicine

## 2015-02-24 ENCOUNTER — Inpatient Hospital Stay: Payer: Self-pay | Admitting: Adult Health

## 2015-02-24 NOTE — Telephone Encounter (Signed)
New message     Patient calling - went to hospital last week.     Patient at rehab at [facility] -  She is been treating for CHF.    Patient wants Dr. Caryl Comes to know where she at & to be in charge of her care.

## 2015-02-24 NOTE — Telephone Encounter (Addendum)
Patient and I discussed her recent hospitalization and what her d/c instructions for follow up were.  She seemed very un-informed of current health status and what she was doing for follow up.  Patient advised to discuss this with the staff at [facility] for proper education of her condition/s.  Patient aware that pulmonology is primary concern at this point in her care and that once that is controlled, acute heart concern may improve.  Explained that pulmonology will ask her to be seen by cardiology if they feel needed/related.  Told her that she may make an appt w/ PA/NP in our office if she feels she needs to be seen following admission.  She will call back if she would like to schedule an appt.

## 2015-02-28 ENCOUNTER — Ambulatory Visit (INDEPENDENT_AMBULATORY_CARE_PROVIDER_SITE_OTHER): Payer: Medicare Other | Admitting: Pulmonary Disease

## 2015-02-28 ENCOUNTER — Encounter: Payer: Self-pay | Admitting: Pulmonary Disease

## 2015-02-28 VITALS — BP 118/64 | HR 88 | Temp 97.0°F | Ht 71.0 in | Wt 272.2 lb

## 2015-02-28 DIAGNOSIS — J438 Other emphysema: Secondary | ICD-10-CM | POA: Diagnosis not present

## 2015-02-28 NOTE — Patient Instructions (Signed)
No change in breathing medications Will send a note to your primary doctor to consider making a dose change to your lopressor. Would like for you to come back in 8 weeks and see Dr. Chase Caller

## 2015-02-28 NOTE — Assessment & Plan Note (Signed)
The patient has had a recent hospitalization for decompensated heart failure, and comes in today for pulmonary follow-up. Her lungs are actually fairly clear, and she is on a very good bronchodilator regimen currently. She is on a very high dose of Lopressor for someone with obstructive lung disease, and I would consider decreasing the dose. I will send a note to her primary physician.

## 2015-02-28 NOTE — Progress Notes (Signed)
   Subjective:    Patient ID: [name], female    DOB: 08-19-1941, 74 y.o.   MRN: [id number]  HPI The patient comes in today for a post hospital pulmonary visit. She has known mild to moderate COPD, and was admitted to the hospital last month with what appears to be acute on chronic congestive heart failure. She was diuresed aggressively, and also treated with steroid-induced for a presumed COPD exacerbation. The patient was discharged on oxygen, and currently resides in a rehabilitation facility. She continues on her good bronchodilator regimen, and has a mild cough primarily from mucus collecting in the back of her throat. She feels that her breathing is clearly better from her recent admission, but she is very weak. She has not had follow-up with her cardiologist. Unfortunately, she continues on a high-dose beta blocker.   Review of Systems  Constitutional: Negative for fever and unexpected weight change.  HENT: Negative for congestion, dental problem, ear pain, nosebleeds, postnasal drip, rhinorrhea, sinus pressure, sneezing, sore throat and trouble swallowing.   Eyes: Negative for redness and itching.  Respiratory: Positive for cough, shortness of breath and wheezing. Negative for chest tightness.   Cardiovascular: Negative for palpitations and leg swelling.  Gastrointestinal: Negative for nausea and vomiting.  Genitourinary: Negative for dysuria.  Musculoskeletal: Negative for joint swelling.  Skin: Negative for rash.  Neurological: Negative for headaches.  Hematological: Does not bruise/bleed easily.  Psychiatric/Behavioral: Negative for dysphoric mood. The patient is not nervous/anxious.        Objective:   Physical Exam Obese female in no acute distress Nose without purulence or discharge noted Neck without lymphadenopathy or thyromegaly Chest with minimal basilar crackles, very good airflow, no wheezing Cardiac exam with regular rate and rhythm Lower extremities with 2+  edema, no cyanosis Alert and oriented, moves all 4 extremities.       Assessment & Plan:

## 2015-03-10 ENCOUNTER — Inpatient Hospital Stay: Payer: Self-pay | Admitting: Adult Health

## 2015-03-13 ENCOUNTER — Encounter: Payer: Self-pay | Admitting: *Deleted

## 2015-03-13 ENCOUNTER — Other Ambulatory Visit: Payer: Self-pay | Admitting: Internal Medicine

## 2015-03-13 DIAGNOSIS — E210 Primary hyperparathyroidism: Secondary | ICD-10-CM | POA: Insufficient documentation

## 2015-03-13 DIAGNOSIS — K219 Gastro-esophageal reflux disease without esophagitis: Secondary | ICD-10-CM | POA: Insufficient documentation

## 2015-03-13 DIAGNOSIS — M8580 Other specified disorders of bone density and structure, unspecified site: Secondary | ICD-10-CM | POA: Insufficient documentation

## 2015-03-13 DIAGNOSIS — M545 Low back pain, unspecified: Secondary | ICD-10-CM | POA: Insufficient documentation

## 2015-03-13 DIAGNOSIS — E1121 Type 2 diabetes mellitus with diabetic nephropathy: Secondary | ICD-10-CM | POA: Insufficient documentation

## 2015-03-13 DIAGNOSIS — E039 Hypothyroidism, unspecified: Secondary | ICD-10-CM | POA: Insufficient documentation

## 2015-03-13 DIAGNOSIS — E668 Other obesity: Secondary | ICD-10-CM | POA: Insufficient documentation

## 2015-03-13 DIAGNOSIS — J45909 Unspecified asthma, uncomplicated: Secondary | ICD-10-CM | POA: Insufficient documentation

## 2015-03-13 DIAGNOSIS — Z8619 Personal history of other infectious and parasitic diseases: Secondary | ICD-10-CM | POA: Insufficient documentation

## 2015-03-13 DIAGNOSIS — D751 Secondary polycythemia: Secondary | ICD-10-CM | POA: Insufficient documentation

## 2015-03-13 DIAGNOSIS — I110 Hypertensive heart disease with heart failure: Secondary | ICD-10-CM | POA: Insufficient documentation

## 2015-03-13 DIAGNOSIS — N183 Chronic kidney disease, stage 3 unspecified: Secondary | ICD-10-CM | POA: Insufficient documentation

## 2015-03-13 DIAGNOSIS — M1A9XX0 Chronic gout, unspecified, without tophus (tophi): Secondary | ICD-10-CM | POA: Insufficient documentation

## 2015-03-13 DIAGNOSIS — G4730 Sleep apnea, unspecified: Secondary | ICD-10-CM | POA: Insufficient documentation

## 2015-03-13 DIAGNOSIS — I480 Paroxysmal atrial fibrillation: Secondary | ICD-10-CM | POA: Insufficient documentation

## 2015-03-13 DIAGNOSIS — E669 Obesity, unspecified: Secondary | ICD-10-CM | POA: Insufficient documentation

## 2015-03-13 DIAGNOSIS — J449 Chronic obstructive pulmonary disease, unspecified: Secondary | ICD-10-CM | POA: Insufficient documentation

## 2015-03-13 DIAGNOSIS — K5010 Crohn's disease of large intestine without complications: Secondary | ICD-10-CM | POA: Insufficient documentation

## 2015-03-15 ENCOUNTER — Telehealth: Payer: Self-pay | Admitting: Pulmonary Disease

## 2015-03-15 NOTE — Telephone Encounter (Signed)
lmomtcb x1 

## 2015-03-16 ENCOUNTER — Other Ambulatory Visit: Payer: Self-pay | Admitting: Internal Medicine

## 2015-03-16 MED ORDER — TIOTROPIUM BROMIDE MONOHYDRATE 2.5 MCG/ACT IN AERS: 2.0000 | INHALATION_SPRAY | Freq: Every day | RESPIRATORY_TRACT | Status: DC

## 2015-03-16 NOTE — Telephone Encounter (Signed)
lmtcb X2 for pt.  

## 2015-03-16 NOTE — Telephone Encounter (Signed)
Spoke with pt and she states that at her 12/2014 appt with Dr Gwenette Greet he gave her samples of Spiriva respimat an was told to call if this helped and he would call in rx.  Pt states this does help her and she would like this called in.  Rx sent to pharmacy.

## 2015-03-28 ENCOUNTER — Encounter: Payer: Self-pay | Admitting: Internal Medicine

## 2015-03-28 ENCOUNTER — Ambulatory Visit (INDEPENDENT_AMBULATORY_CARE_PROVIDER_SITE_OTHER): Payer: Medicare Other | Admitting: Internal Medicine

## 2015-03-28 ENCOUNTER — Emergency Department (HOSPITAL_COMMUNITY): Payer: Medicare Other

## 2015-03-28 ENCOUNTER — Observation Stay (HOSPITAL_COMMUNITY)
Admission: EM | Admit: 2015-03-28 | Discharge: 2015-03-30 | Disposition: A | Discharge status: Home / Self Care | Payer: Medicare Other | Attending: Internal Medicine | Admitting: Internal Medicine

## 2015-03-28 ENCOUNTER — Encounter (HOSPITAL_COMMUNITY): Payer: Self-pay | Admitting: Nurse Practitioner

## 2015-03-28 VITALS — BP 92/64 | HR 114 | Ht 71.0 in

## 2015-03-28 DIAGNOSIS — N183 Chronic kidney disease, stage 3 unspecified: Secondary | ICD-10-CM | POA: Diagnosis present

## 2015-03-28 DIAGNOSIS — R509 Fever, unspecified: Secondary | ICD-10-CM | POA: Insufficient documentation

## 2015-03-28 DIAGNOSIS — Z87891 Personal history of nicotine dependence: Secondary | ICD-10-CM | POA: Insufficient documentation

## 2015-03-28 DIAGNOSIS — M1990 Unspecified osteoarthritis, unspecified site: Secondary | ICD-10-CM | POA: Insufficient documentation

## 2015-03-28 DIAGNOSIS — R55 Syncope and collapse: Principal | ICD-10-CM | POA: Diagnosis present

## 2015-03-28 DIAGNOSIS — R609 Edema, unspecified: Secondary | ICD-10-CM

## 2015-03-28 DIAGNOSIS — I10 Essential (primary) hypertension: Secondary | ICD-10-CM

## 2015-03-28 DIAGNOSIS — I4891 Unspecified atrial fibrillation: Secondary | ICD-10-CM

## 2015-03-28 DIAGNOSIS — K219 Gastro-esophageal reflux disease without esophagitis: Secondary | ICD-10-CM | POA: Diagnosis not present

## 2015-03-28 DIAGNOSIS — Z950 Presence of cardiac pacemaker: Secondary | ICD-10-CM | POA: Insufficient documentation

## 2015-03-28 DIAGNOSIS — E1121 Type 2 diabetes mellitus with diabetic nephropathy: Secondary | ICD-10-CM | POA: Insufficient documentation

## 2015-03-28 DIAGNOSIS — J449 Chronic obstructive pulmonary disease, unspecified: Secondary | ICD-10-CM

## 2015-03-28 DIAGNOSIS — Z6841 Body Mass Index (BMI) 40.0 and over, adult: Secondary | ICD-10-CM | POA: Diagnosis not present

## 2015-03-28 DIAGNOSIS — I482 Chronic atrial fibrillation: Secondary | ICD-10-CM | POA: Diagnosis not present

## 2015-03-28 DIAGNOSIS — I2510 Atherosclerotic heart disease of native coronary artery without angina pectoris: Secondary | ICD-10-CM | POA: Diagnosis not present

## 2015-03-28 DIAGNOSIS — I5042 Chronic combined systolic (congestive) and diastolic (congestive) heart failure: Secondary | ICD-10-CM | POA: Insufficient documentation

## 2015-03-28 DIAGNOSIS — I495 Sick sinus syndrome: Secondary | ICD-10-CM | POA: Diagnosis not present

## 2015-03-28 DIAGNOSIS — E039 Hypothyroidism, unspecified: Secondary | ICD-10-CM | POA: Diagnosis not present

## 2015-03-28 DIAGNOSIS — E11649 Type 2 diabetes mellitus with hypoglycemia without coma: Secondary | ICD-10-CM | POA: Insufficient documentation

## 2015-03-28 DIAGNOSIS — E785 Hyperlipidemia, unspecified: Secondary | ICD-10-CM | POA: Insufficient documentation

## 2015-03-28 DIAGNOSIS — I129 Hypertensive chronic kidney disease with stage 1 through stage 4 chronic kidney disease, or unspecified chronic kidney disease: Secondary | ICD-10-CM | POA: Diagnosis not present

## 2015-03-28 DIAGNOSIS — G4730 Sleep apnea, unspecified: Secondary | ICD-10-CM | POA: Diagnosis present

## 2015-03-28 DIAGNOSIS — K5090 Crohn's disease, unspecified, without complications: Secondary | ICD-10-CM | POA: Diagnosis not present

## 2015-03-28 DIAGNOSIS — K5010 Crohn's disease of large intestine without complications: Secondary | ICD-10-CM

## 2015-03-28 DIAGNOSIS — I25119 Atherosclerotic heart disease of native coronary artery with unspecified angina pectoris: Secondary | ICD-10-CM | POA: Insufficient documentation

## 2015-03-28 DIAGNOSIS — I110 Hypertensive heart disease with heart failure: Secondary | ICD-10-CM | POA: Diagnosis present

## 2015-03-28 LAB — URINALYSIS, ROUTINE W REFLEX MICROSCOPIC
Glucose, UA: NEGATIVE mg/dL
HGB URINE DIPSTICK: NEGATIVE
KETONES UR: 15 mg/dL — AB
Nitrite: NEGATIVE
PH: 5 (ref 5.0–8.0)
Protein, ur: >300 mg/dL — AB
SPECIFIC GRAVITY, URINE: 1.026 (ref 1.005–1.030)
Urobilinogen, UA: 1 mg/dL (ref 0.0–1.0)

## 2015-03-28 LAB — CUP PACEART INCLINIC DEVICE CHECK
Battery Impedance: 347 Ohm
Battery Voltage: 2.8 V
Date Time Interrogation Session: 20160628171056
Lead Channel Pacing Threshold Amplitude: 1.25 V
Lead Channel Pacing Threshold Pulse Width: 0.4 ms
MDC IDC MSMT BATTERY REMAINING LONGEVITY: 130 mo
MDC IDC MSMT LEADCHNL RA IMPEDANCE VALUE: 67 Ohm
MDC IDC MSMT LEADCHNL RV IMPEDANCE VALUE: 597 Ohm
MDC IDC MSMT LEADCHNL RV SENSING INTR AMPL: 11.2 mV
MDC IDC SET LEADCHNL RV PACING AMPLITUDE: 2.5 V
MDC IDC SET LEADCHNL RV PACING PULSEWIDTH: 0.4 ms
MDC IDC SET LEADCHNL RV SENSING SENSITIVITY: 4 mV
MDC IDC STAT BRADY RV PERCENT PACED: 14 %

## 2015-03-28 LAB — COMPREHENSIVE METABOLIC PANEL
ALBUMIN: 3.5 g/dL (ref 3.5–5.0)
ALT: 20 U/L (ref 14–54)
AST: 25 U/L (ref 15–41)
Alkaline Phosphatase: 81 U/L (ref 38–126)
Anion gap: 8 (ref 5–15)
BILIRUBIN TOTAL: 1.2 mg/dL (ref 0.3–1.2)
BUN: 18 mg/dL (ref 6–20)
CO2: 25 mmol/L (ref 22–32)
CREATININE: 1.16 mg/dL — AB (ref 0.44–1.00)
Calcium: 9.9 mg/dL (ref 8.9–10.3)
Chloride: 103 mmol/L (ref 101–111)
GFR calc Af Amer: 52 mL/min — ABNORMAL LOW (ref >60)
GFR calc non Af Amer: 45 mL/min — ABNORMAL LOW (ref >60)
Glucose, Bld: 171 mg/dL — ABNORMAL HIGH (ref 65–99)
POTASSIUM: 4.2 mmol/L (ref 3.5–5.1)
Sodium: 136 mmol/L (ref 135–145)
TOTAL PROTEIN: 7.3 g/dL (ref 6.5–8.1)

## 2015-03-28 LAB — BRAIN NATRIURETIC PEPTIDE: B NATRIURETIC PEPTIDE 5: 224 pg/mL — AB (ref 0.0–100.0)

## 2015-03-28 LAB — CBC WITH DIFFERENTIAL/PLATELET
BASOS ABS: 0 10*3/uL (ref 0.0–0.1)
BASOS PCT: 0 % (ref 0–1)
Eosinophils Absolute: 0 10*3/uL (ref 0.0–0.7)
Eosinophils Relative: 0 % (ref 0–5)
HEMATOCRIT: 46.4 % — AB (ref 36.0–46.0)
Hemoglobin: 15.9 g/dL — ABNORMAL HIGH (ref 12.0–15.0)
Lymphocytes Relative: 7 % — ABNORMAL LOW (ref 12–46)
Lymphs Abs: 0.8 10*3/uL (ref 0.7–4.0)
MCH: 32.4 pg (ref 26.0–34.0)
MCHC: 34.3 g/dL (ref 30.0–36.0)
MCV: 94.5 fL (ref 78.0–100.0)
Monocytes Absolute: 1.3 10*3/uL — ABNORMAL HIGH (ref 0.1–1.0)
Monocytes Relative: 10 % (ref 3–12)
NEUTROS ABS: 10.5 10*3/uL — AB (ref 1.7–7.7)
Neutrophils Relative %: 83 % — ABNORMAL HIGH (ref 43–77)
Platelets: 285 10*3/uL (ref 150–400)
RBC: 4.91 MIL/uL (ref 3.87–5.11)
RDW: 15.5 % (ref 11.5–15.5)
WBC: 12.7 10*3/uL — AB (ref 4.0–10.5)

## 2015-03-28 LAB — URINE MICROSCOPIC-ADD ON

## 2015-03-28 LAB — I-STAT CG4 LACTIC ACID, ED: Lactic Acid, Venous: 2.04 mmol/L (ref 0.5–2.0)

## 2015-03-28 LAB — GLUCOSE, CAPILLARY: Glucose-Capillary: 123 mg/dL — ABNORMAL HIGH (ref 65–99)

## 2015-03-28 MED ORDER — ATORVASTATIN CALCIUM 80 MG PO TABS
80.0000 mg | ORAL_TABLET | Freq: Every day | ORAL | Status: DC
  Administered 2015-03-29 (×2): 80 mg via ORAL
  Filled 2015-03-28 (×3): qty 1

## 2015-03-28 MED ORDER — PANTOPRAZOLE SODIUM 40 MG PO TBEC
40.0000 mg | DELAYED_RELEASE_TABLET | Freq: Every day | ORAL | Status: DC
  Administered 2015-03-29: 40 mg via ORAL
  Filled 2015-03-28: qty 1

## 2015-03-28 MED ORDER — ONDANSETRON HCL 4 MG PO TABS: 4.0000 mg | ORAL_TABLET | Freq: Four times a day (QID) | ORAL | Status: DC | PRN

## 2015-03-28 MED ORDER — ACETAMINOPHEN 325 MG PO TABS: 650.0000 mg | ORAL_TABLET | Freq: Four times a day (QID) | ORAL | Status: DC | PRN

## 2015-03-28 MED ORDER — INSULIN ASPART 100 UNIT/ML ~~LOC~~ SOLN
0.0000 [IU] | Freq: Three times a day (TID) | SUBCUTANEOUS | Status: DC
Start: 2015-03-29 — End: 2015-03-30
  Administered 2015-03-29: 2 [IU] via SUBCUTANEOUS

## 2015-03-28 MED ORDER — APIXABAN 5 MG PO TABS
5.0000 mg | ORAL_TABLET | Freq: Two times a day (BID) | ORAL | Status: DC
  Administered 2015-03-29 – 2015-03-30 (×4): 5 mg via ORAL
  Filled 2015-03-28 (×5): qty 1

## 2015-03-28 MED ORDER — SULFASALAZINE 500 MG PO TABS
1000.0000 mg | ORAL_TABLET | Freq: Two times a day (BID) | ORAL | Status: DC
  Administered 2015-03-29 – 2015-03-30 (×4): 1000 mg via ORAL
  Filled 2015-03-28 (×5): qty 2

## 2015-03-28 MED ORDER — TIOTROPIUM BROMIDE MONOHYDRATE 2.5 MCG/ACT IN AERS: 2.0000 | INHALATION_SPRAY | Freq: Every day | RESPIRATORY_TRACT | Status: DC

## 2015-03-28 MED ORDER — LOSARTAN POTASSIUM 50 MG PO TABS
50.0000 mg | ORAL_TABLET | Freq: Every day | ORAL | Status: DC
  Administered 2015-03-29: 50 mg via ORAL
  Filled 2015-03-28 (×2): qty 1

## 2015-03-28 MED ORDER — ACETAMINOPHEN 650 MG RE SUPP: 650.0000 mg | Freq: Four times a day (QID) | RECTAL | Status: DC | PRN

## 2015-03-28 MED ORDER — TIOTROPIUM BROMIDE MONOHYDRATE 18 MCG IN CAPS
18.0000 ug | ORAL_CAPSULE | Freq: Every day | RESPIRATORY_TRACT | Status: DC
  Administered 2015-03-29 – 2015-03-30 (×2): 18 ug via RESPIRATORY_TRACT
  Filled 2015-03-28: qty 5

## 2015-03-28 MED ORDER — METOPROLOL TARTRATE 100 MG PO TABS
100.0000 mg | ORAL_TABLET | Freq: Two times a day (BID) | ORAL | Status: DC
  Filled 2015-03-28 (×2): qty 1

## 2015-03-28 MED ORDER — MOMETASONE FURO-FORMOTEROL FUM 100-5 MCG/ACT IN AERO
2.0000 | INHALATION_SPRAY | Freq: Two times a day (BID) | RESPIRATORY_TRACT | Status: DC
  Administered 2015-03-29 – 2015-03-30 (×3): 2 via RESPIRATORY_TRACT
  Filled 2015-03-28: qty 8.8

## 2015-03-28 MED ORDER — LEVOTHYROXINE SODIUM 75 MCG PO TABS
75.0000 ug | ORAL_TABLET | Freq: Every day | ORAL | Status: DC
  Administered 2015-03-29 – 2015-03-30 (×2): 75 ug via ORAL
  Filled 2015-03-28 (×3): qty 1

## 2015-03-28 MED ORDER — ONDANSETRON HCL 4 MG/2ML IJ SOLN: 4.0000 mg | Freq: Four times a day (QID) | INTRAMUSCULAR | Status: DC | PRN

## 2015-03-28 MED ORDER — ALBUTEROL SULFATE (2.5 MG/3ML) 0.083% IN NEBU: 2.5000 mg | INHALATION_SOLUTION | Freq: Four times a day (QID) | RESPIRATORY_TRACT | Status: DC | PRN

## 2015-03-28 MED ORDER — SODIUM CHLORIDE 0.9 % IJ SOLN
3.0000 mL | Freq: Two times a day (BID) | INTRAMUSCULAR | Status: DC
  Administered 2015-03-29 – 2015-03-30 (×4): 3 mL via INTRAVENOUS

## 2015-03-28 MED ORDER — FUROSEMIDE 40 MG PO TABS
40.0000 mg | ORAL_TABLET | Freq: Two times a day (BID) | ORAL | Status: DC
  Administered 2015-03-29 – 2015-03-30 (×4): 40 mg via ORAL
  Filled 2015-03-28 (×7): qty 1

## 2015-03-28 MED ORDER — RANOLAZINE ER 500 MG PO TB12
500.0000 mg | ORAL_TABLET | Freq: Two times a day (BID) | ORAL | Status: DC
  Administered 2015-03-29 – 2015-03-30 (×4): 500 mg via ORAL
  Filled 2015-03-28 (×5): qty 1

## 2015-03-28 MED ORDER — OMEPRAZOLE MAGNESIUM 20 MG PO TBEC: 20.0000 mg | DELAYED_RELEASE_TABLET | Freq: Every day | ORAL | Status: DC

## 2015-03-28 MED ORDER — INSULIN ASPART 100 UNIT/ML ~~LOC~~ SOLN: 0.0000 [IU] | Freq: Every day | SUBCUTANEOUS | Status: DC

## 2015-03-28 MED ORDER — INSULIN GLARGINE 100 UNIT/ML ~~LOC~~ SOLN
20.0000 [IU] | SUBCUTANEOUS | Status: DC
  Administered 2015-03-29 – 2015-03-30 (×2): 20 [IU] via SUBCUTANEOUS
  Filled 2015-03-28 (×2): qty 0.2

## 2015-03-28 NOTE — ED Notes (Addendum)
She said she hasnt felt well since yesterday and then began to vomit this morning. She denies pain. Her doctor sent her from an office visit today because she was hypotensive in his office and he told her he was concerned she may have an infection. She is A&Ox4, breathing easily. The pt just returned home after a short stay in a skilled nursing facility

## 2015-03-28 NOTE — H&P (Signed)
[facility] History and Physical  Patient: [name]  MRN: [id number]  DOB: 1940-11-17  DOS: the patient was seen and examined on 03/28/2015 PCP: [name], MD  Referring physician: Dr. Waverly Ferrari Chief Complaint: Shortness of breath and hypertension  HPI: [name] is a 74 y.o. female with Past medical history of chronic combined CHF, tachybradycardia syndrome status post pacemaker implant, morbid obesity, chronic COPD, GERD, Crohn's disease, hypothyroidism, morbid disorder, sleep apnea, diabetes mellitus type 2. The patient is presenting with complaints of low blood sugar fatigue as well as shortness of breath and hypotension. The patient had history of diabetes since last one week her sugar has been running in the low 100 whenever she checks it. She feels fatigued. Today she went to see her cardiologist where while walking from her taxi to physician's office she was significantly tired and fatigued, felt dizzy and was short of breath. Her blood pressure was 80 systolic and her heart rate was 110 and with this findings she was referred to hospital with a concern for possible infection to pacemaker. Patient had an episode of vomiting earlier during the day when she woke up. At the time of my evaluation the patient denies any complaints of fever, chills, chest pain, shortness of breath, dizziness, lightheadedness, cough, nausea, vomiting, abdominal pain, diarrhea, constipation, burning urination, swelling of her legs, tingling numbness of focal deficit. There is also no complaints of recent change in her medications.  The patient is coming from home.  At her baseline ambulates without any support And is independent for most of her ADL manages her medication on her own.  Review of Systems: as mentioned in the history of present illness.  A comprehensive review of the other systems is negative.  Past Medical History  Diagnosis Date  . Nonischemic cardiomyopathy     history  of,  EF 20-25% at left heart cath in 06/2007; echo 2011 had normal LV function  . Chronic diastolic heart failure     Echo 06/2010: Moderate LVH, EF 55-65%, normal wall motion, mild MR, moderate to severe LAE, mild RAE.   . Morbid obesity   . Hyperlipidemia   . Tachy-brady syndrome     status post implant of a medtronic dual-chamber pacemaker in 2001.  explanted in 2010 -- [place] pacemaker in 2010.  Marland Kitchen Chronic obstructive pulmonary disease   . Osteoporosis   . Depression   . GERD (gastroesophageal reflux disease)   . Crohn's disease   . Hypothyroidism     treated  . Seasonal allergies   . Pacemaker     Medtronic  . Atrial fibrillation     Status post pulmonary vein isolation 2009 at [facility]  . CAD (coronary artery disease)     LHC 05/2007: pLAD 70-80%, pRCA 40%, EF 20-25%. LAD lesion treated medically.   . Coronary artery disease 06/15/2012  . Atrial fibrillation 12/18/2007    Annotation: refractory Qualifier: Diagnosis of  By: Doy Mince LPN, Megan    . Morbid obesity with BMI of 40.0-44.9, adult 06/22/2012  . Diabetes mellitus 06/22/2012  . Atrial fibrillation   . Diabetes mellitus without complication   . COPD (chronic obstructive pulmonary disease)   . Gout   . Dysrhythmia     atrial fib  . Peripheral vascular disease 15    rt arm clots  . Obstructive sleep apnea     continuous positive airway pressure not using at present  . Shortness of breath dyspnea  exersion  . CHF (congestive heart failure)    Past Surgical History  Procedure Laterality Date  . Pulmonary vein isolation  05/12/2008    RFCA atrial fibrillation  . Post ablation pseudoaneurysm      at A fib ablation  . Pacemaker insertion  2010    medtronic ADAPTA   . Tonsillectomy    . Embolectomy Right 08/16/2014    Procedure: EMBOLECTOMY- RIGHT BRACHIAL ARTERY;  Surgeon: Serafina Mitchell, MD;  Location: [facility] OR;  Service: Vascular;  Laterality: Right;  . Cardiac catheterization  08/2000      noncritical disease mid RCA, EF preserved  . Cardiac catheterization  05/29/2007    noncritical disease, EF 20-25%  . Eye surgery Right 15    cataracts  lft yrs ago  . Parathyroidectomy Right 01/30/2015    Procedure: PARATHYROIDECTOMY;  Surgeon: Armandina Gemma, MD;  Location: [facility];  Service: General;  Laterality: Right;   Social History:  reports that she quit smoking about 28 years ago. She quit smokeless tobacco use about 27 years ago. She reports that she does not drink alcohol or use illicit drugs.  No Known Allergies  Family History  Problem Relation Age of Onset  . Breast cancer Mother   . Heart disease Father   . Emphysema Father     Prior to Admission medications   Medication Sig Start Date End Date Taking? Authorizing Provider  albuterol (PROVENTIL HFA;VENTOLIN HFA) 108 (90 BASE) MCG/ACT inhaler Inhale 2 puffs into the lungs every 6 (six) hours as needed for wheezing or shortness of breath.   Yes Historical Provider, MD  atorvastatin (LIPITOR) 80 MG tablet Take 80 mg by mouth at bedtime.     Yes Historical Provider, MD  ELIQUIS 5 MG TABS tablet TAKE 1 TABLET BY MOUTH TWICE DAILY 03/13/15  Yes Deboraha Sprang, MD  Fiber CAPS Take 1 tablet by mouth at bedtime.    Yes Historical Provider, MD  Fluticasone-Salmeterol (ADVAIR) 250-50 MCG/DOSE AEPB Inhale 1 puff into the lungs every 12 (twelve) hours. 01/17/15  Yes Kathee Delton, MD  furosemide (LASIX) 40 MG tablet 1 tablet by mouth in the am & 2 tablets by mouth in the pm   Yes Historical Provider, MD  insulin glargine (LANTUS) 100 UNIT/ML injection Inject 30 Units into the skin every morning.    Yes Historical Provider, MD  KLOR-CON M20 20 MEQ tablet TAKE 1 TABLET BY MOUTH TWICE DAILY 03/17/15  Yes Deboraha Sprang, MD  levothyroxine (SYNTHROID, LEVOTHROID) 75 MCG tablet Take 75 mcg by mouth daily. 01/20/15  Yes Historical Provider, MD  loratadine (CLARITIN) 10 MG tablet Take 10 mg by mouth as needed for allergies.    Yes Historical  Provider, MD  losartan (COZAAR) 50 MG tablet Take 50 mg by mouth at bedtime.  01/16/15  Yes Historical Provider, MD  metoprolol (LOPRESSOR) 100 MG tablet TAKE 1 TABLET (100 MG TOTAL) BY MOUTH 2 (TWO) TIMES DAILY. 12/23/14  Yes Deboraha Sprang, MD  Multiple Vitamins-Minerals (MULTIVITAMIN WITH MINERALS) tablet Take 1 tablet by mouth daily.   Yes Historical Provider, MD  nitroGLYCERIN (NITROSTAT) 0.4 MG SL tablet Place 1 tablet (0.4 mg total) under the tongue every 5 (five) minutes as needed for chest pain. 07/07/14 07/07/15 Yes Deboraha Sprang, MD  omeprazole (PRILOSEC OTC) 20 MG tablet Take 20 mg by mouth daily.     Yes Historical Provider, MD  RANEXA 500 MG 12 hr tablet Take 1 tablet by mouth 2 (two) times  daily. 03/14/15  Yes Historical Provider, MD  sulfaSALAzine (AZULFIDINE) 500 MG tablet Take 1,000 mg by mouth 2 (two) times daily. 03/16/15  Yes Historical Provider, MD  Tiotropium Bromide Monohydrate (SPIRIVA RESPIMAT) 2.5 MCG/ACT AERS Inhale 2 puffs into the lungs daily. 03/16/15  Yes Collene Gobble, MD  ipratropium-albuterol (DUONEB) 0.5-2.5 (3) MG/3ML SOLN Take 3 mLs by nebulization every 6 (six) hours as needed (wheezing and or shortness of breath). Patient not taking: Reported on 03/28/2015 02/18/15   Louellen Molder, MD    Physical Exam: Filed Vitals:   03/28/15 2115 03/28/15 2130 03/28/15 2145 03/28/15 2235  BP: 111/94 127/98 123/63 125/77  Pulse: 95 49 84 91  Temp:    99.6 F (37.6 C)  TempSrc:    Oral  Resp: _0 Height:    _1  (1.803 m)  Weight:    125.601 kg (276 lb 14.4 oz)  SpO2: 96% 91% 95% 95%    General: Alert, Awake and Oriented to Time, Place and Person. Appear in mild distress Eyes: PERRL ENT: Oral Mucosa clear moist. Neck: no JVD Cardiovascular: S1 and S2 Present, no Murmur, Peripheral Pulses Present Respiratory: Bilateral Air entry equal and Decreased,  Clear to Auscultation, no Crackles, no wheezes Abdomen: Bowel Sound present, Soft and nono tender Skin:  no Rash Extremities: tracePedal edema, no calf tenderness Neurologic: Grossly no focal neuro deficit.  Labs on Admission:  CBC:  Recent Labs Lab 03/28/15 1619  WBC 12.7*  NEUTROABS 10.5*  HGB 15.9*  HCT 46.4*  MCV 94.5  PLT 285    CMP     Component Value Date/Time   NA 136 03/28/2015 1619   K 4.2 03/28/2015 1619   CL 103 03/28/2015 1619   CO2 25 03/28/2015 1619   GLUCOSE 171* 03/28/2015 1619   BUN 18 03/28/2015 1619   CREATININE 1.16* 03/28/2015 1619   CALCIUM 9.9 03/28/2015 1619   CALCIUM 10.0 06/06/2007 0420   PROT 7.3 03/28/2015 1619   ALBUMIN 3.5 03/28/2015 1619   AST 25 03/28/2015 1619   ALT 20 03/28/2015 1619   ALKPHOS 81 03/28/2015 1619   BILITOT 1.2 03/28/2015 1619   GFRNONAA 45* 03/28/2015 1619   GFRAA 52* 03/28/2015 1619    No results for input(s): LIPASE, AMYLASE in the last 168 hours.  No results for input(s): CKTOTAL, CKMB, CKMBINDEX, TROPONINI in the last 168 hours. BNP (last 3 results)  Recent Labs  02/15/15 1129 03/28/15 1857  BNP 1515.5* 224.0*    ProBNP (last 3 results) No results for input(s): PROBNP in the last 8760 hours.   Radiological Exams on Admission: Dg Chest 2 View  03/28/2015   CLINICAL DATA:  74 year old female with hypotension.  EXAM: CHEST  2 VIEW  COMPARISON:  Radiograph dated 02/05/2015  FINDINGS: Two views of the chest demonstrate stable increased interstitial prominence. No focal consolidation. There is apparent blunting of the left costophrenic angle which may be artifactual or represent a small pleural effusion. There is stable mild cardiomegaly. Left pectoral pacemaker device noted. Osteopenia with degenerative changes of the spine. Mild multilevel mid and lower thoracic compression fractures, likely old.  IMPRESSION: Stable cardiomegaly and interstitial prominence. No focal consolidation.   Electronically Signed   By: Anner Crete M.D.   On: 03/28/2015 19:47   EKG: Independently reviewed. normal sinus rhythm,  nonspecific ST and T waves changes.  Assessment/Plan Principal Problem:   Near syncope Active Problems:   Atrial fibrillation   CARDIAC PACEMAKER IN SITU-MEDTRONIC ADAPTA L  Coronary artery disease   Diabetes mellitus   Body mass index (BMI) of 40.0-44.9 in adult   Chronic kidney disease, stage III (moderate)   Chronic obstructive pulmonary disease   Crohn's disease of large intestine without complication   Gastro-esophageal reflux disease without esophagitis   Essential (primary) hypertension   Hypothyroidism   Sleep apnea   Type 2 diabetes mellitus with diabetic nephropathy   1. Near syncope The patient is presenting with complaints of dizziness and hypotension with tachycardia. Most likely in the setting of atrial fibrillation. Currently she is hemodynamically stable with multiple orthostatics vital signs normal. I will reduce her Lasix dose tonight from 2 tablets to 1 tablet. Monitor her on telemetry.  2. Hypoglycemia with generalized fatigue. Pacemaker implant. Patient's cardiac events was primarily concerned about having a pacemaker infection. At present I do not see any evidence of infection therefore holding off on any antibiotics.  We will check ESR and CRP as well as follow blood cultures. Monitor closely.  3. Hypoglycemic episodes. Diabetes mellitus type 2. Reducing her Lantus dose from 30 units to 20 units. She remains on regular cardiac diet. Insulin sliding scale.  4. Hypothyroidism. Checking TSH and free T4.  5. History of Crohn's disease. Denies any diarrhea. Continue home medication.  Advance goals of care discussion: full code   DVT Prophylaxis: on chronic therapeutic anticoagulation. Nutrition: cardiac and diabetic diet  Family Communication: family was present at bedside, opportunity was given to ask question and all questions were answered satisfactorily at the time of interview. Disposition: Admitted as observation, telemetry  unit.  Author: Berle Mull, MD [facility] Pager: [phone] 03/28/2015  If 7PM-7AM, please contact night-coverage [email or web address] Password [email or web address]

## 2015-03-28 NOTE — Patient Instructions (Signed)
Medication Instructions:  Your physician recommends that you continue on your current medications as directed. Please refer to the Current Medication list given to you today.  Labwork: None ordered  Testing/Procedures: None ordered  Follow-Up: Your physician recommends that you schedule a follow-up appointment in: 2 months with Dr. Caryl Comes.  Thank you for choosing [facility]!!

## 2015-03-28 NOTE — ED Provider Notes (Signed)
CSN: [id number]     Arrival date & time 03/28/15  1539 History   First MD Initiated Contact with Patient 03/28/15 1823     Chief Complaint  Patient presents with  . Fever     (Consider location/radiation/quality/duration/timing/severity/associated sxs/prior Treatment) HPI Comments: Patient was referred to the emergency department by her cardiologist for evaluation of possible infection. She was seen in the office today for routine follow-up and was found to be hypotensive. Patient reports that her home visiting nurse has been documenting low blood pressures for the last 4 or 5 days. She has been experiencing chills. This morning she had nausea and vomiting. She is a diabetic, has been experiencing low blood sugars over the last few days as well. Patient denies abdominal pain currently. She has been feeling weak over the last few days. She has not documented any fever.  Patient does have a history of congestive heart failure. She was recently hospitalized for shortness of breath and treated for heart failure. She has a history of atrial fibrillation and has a pacemaker. Her weakness in the past has been attributed to a combination of A. fib and congestive heart failure. Her pacemaker rate was recently increased due to this. Patient does report that she has been feeling weak and somewhat short of breath. She also feels like her legs are more swollen than usual.  Patient is a 74 y.o. female presenting with fever.  Fever   Past Medical History  Diagnosis Date  . Nonischemic cardiomyopathy     history of,  EF 20-25% at left heart cath in 06/2007; echo 2011 had normal LV function  . Chronic diastolic heart failure     Echo 06/2010: Moderate LVH, EF 55-65%, normal wall motion, mild MR, moderate to severe LAE, mild RAE.   . Morbid obesity   . Hyperlipidemia   . Tachy-brady syndrome     status post implant of a medtronic dual-chamber pacemaker in 2001.  explanted in 2010 -- [place] pacemaker in 2010.  Marland Kitchen Chronic obstructive pulmonary disease   . Osteoporosis   . Depression   . GERD (gastroesophageal reflux disease)   . Crohn's disease   . Hypothyroidism     treated  . Seasonal allergies   . Pacemaker     Medtronic  . Atrial fibrillation     Status post pulmonary vein isolation 2009 at [facility]  . CAD (coronary artery disease)     LHC 05/2007: pLAD 70-80%, pRCA 40%, EF 20-25%. LAD lesion treated medically.   . Coronary artery disease 06/15/2012  . Atrial fibrillation 12/18/2007    Annotation: refractory Qualifier: Diagnosis of  By: Doy Mince LPN, Megan    . Morbid obesity with BMI of 40.0-44.9, adult 06/22/2012  . Diabetes mellitus 06/22/2012  . Atrial fibrillation   . Diabetes mellitus without complication   . COPD (chronic obstructive pulmonary disease)   . Gout   . Dysrhythmia     atrial fib  . Peripheral vascular disease 15    rt arm clots  . Obstructive sleep apnea     continuous positive airway pressure not using at present  . Shortness of breath dyspnea     exersion  . CHF (congestive heart failure)    Past Surgical History  Procedure Laterality Date  . Pulmonary vein isolation  05/12/2008    RFCA atrial fibrillation  . Post ablation pseudoaneurysm      at A fib ablation  . Pacemaker insertion  2010  medtronic ADAPTA   . Tonsillectomy    . Embolectomy Right 08/16/2014    Procedure: EMBOLECTOMY- RIGHT BRACHIAL ARTERY;  Surgeon: Serafina Mitchell, MD;  Location: [facility] OR;  Service: Vascular;  Laterality: Right;  . Cardiac catheterization  08/2000    noncritical disease mid RCA, EF preserved  . Cardiac catheterization  05/29/2007    noncritical disease, EF 20-25%  . Eye surgery Right 16    cataracts  lft yrs ago  . Parathyroidectomy Right 01/30/2015    Procedure: PARATHYROIDECTOMY;  Surgeon: Armandina Gemma, MD;  Location: [place];  Service: General;  Laterality: Right;   Family History  Problem Relation Age of Onset  . Breast cancer  Mother   . Heart disease Father   . Emphysema Father    History  Substance Use Topics  . Smoking status: Former Smoker -- 3.00 packs/day for 32 years    Quit date: 09/30/1986  . Smokeless tobacco: Former Systems developer    Quit date: 08/15/1987  . Alcohol Use: No   OB History    Gravida Para Term Preterm AB TAB SAB Ectopic Multiple Living   0 0 0 0 0 0 0 0       Review of Systems  Constitutional: Positive for fever.  Respiratory: Positive for shortness of breath.   Cardiovascular: Positive for leg swelling.  All other systems reviewed and are negative.     Allergies  Review of patient's allergies indicates no known allergies.  Home Medications   Prior to Admission medications   Medication Sig Start Date End Date Taking? Authorizing Provider  albuterol (PROVENTIL HFA;VENTOLIN HFA) 108 (90 BASE) MCG/ACT inhaler Inhale 2 puffs into the lungs every 6 (six) hours as needed for wheezing or shortness of breath.   Yes Historical Provider, MD  atorvastatin (LIPITOR) 80 MG tablet Take 80 mg by mouth at bedtime.     Yes Historical Provider, MD  ELIQUIS 5 MG TABS tablet TAKE 1 TABLET BY MOUTH TWICE DAILY 03/13/15  Yes Deboraha Sprang, MD  Fiber CAPS Take 1 tablet by mouth at bedtime.    Yes Historical Provider, MD  Fluticasone-Salmeterol (ADVAIR) 250-50 MCG/DOSE AEPB Inhale 1 puff into the lungs every 12 (twelve) hours. 01/17/15  Yes Kathee Delton, MD  furosemide (LASIX) 40 MG tablet 1 tablet by mouth in the am & 2 tablets by mouth in the pm   Yes Historical Provider, MD  insulin glargine (LANTUS) 100 UNIT/ML injection Inject 30 Units into the skin every morning.    Yes Historical Provider, MD  KLOR-CON M20 20 MEQ tablet TAKE 1 TABLET BY MOUTH TWICE DAILY 03/17/15  Yes Deboraha Sprang, MD  levothyroxine (SYNTHROID, LEVOTHROID) 75 MCG tablet Take 75 mcg by mouth daily. 01/20/15  Yes Historical Provider, MD  loratadine (CLARITIN) 10 MG tablet Take 10 mg by mouth as needed for allergies.    Yes Historical  Provider, MD  losartan (COZAAR) 50 MG tablet Take 50 mg by mouth at bedtime.  01/16/15  Yes Historical Provider, MD  metoprolol (LOPRESSOR) 100 MG tablet TAKE 1 TABLET (100 MG TOTAL) BY MOUTH 2 (TWO) TIMES DAILY. 12/23/14  Yes Deboraha Sprang, MD  Multiple Vitamins-Minerals (MULTIVITAMIN WITH MINERALS) tablet Take 1 tablet by mouth daily.   Yes Historical Provider, MD  nitroGLYCERIN (NITROSTAT) 0.4 MG SL tablet Place 1 tablet (0.4 mg total) under the tongue every 5 (five) minutes as needed for chest pain. 07/07/14 07/07/15 Yes Deboraha Sprang, MD  omeprazole (PRILOSEC OTC) 20 MG tablet  Take 20 mg by mouth daily.     Yes Historical Provider, MD  RANEXA 500 MG 12 hr tablet Take 1 tablet by mouth 2 (two) times daily. 03/14/15  Yes Historical Provider, MD  sulfaSALAzine (AZULFIDINE) 500 MG tablet Take 1,000 mg by mouth 2 (two) times daily. 03/16/15  Yes Historical Provider, MD  Tiotropium Bromide Monohydrate (SPIRIVA RESPIMAT) 2.5 MCG/ACT AERS Inhale 2 puffs into the lungs daily. 03/16/15  Yes Collene Gobble, MD  ipratropium-albuterol (DUONEB) 0.5-2.5 (3) MG/3ML SOLN Take 3 mLs by nebulization every 6 (six) hours as needed (wheezing and or shortness of breath). Patient not taking: Reported on 03/28/2015 02/18/15   Nishant Dhungel, MD   BP 93/44 mmHg  Pulse 85  Temp(Src) 98.9 F (37.2 C) (Oral)  Resp 18  Ht 5\' 11"  (1.803 m)  Wt 276 lb 7.4 oz (125.402 kg)  BMI 38.58 kg/m2  SpO2 95% Physical Exam  Constitutional: She is oriented to person, place, and time. She appears well-developed and well-nourished. No distress.  HENT:  Head: Normocephalic and atraumatic.  Right Ear: Hearing normal.  Left Ear: Hearing normal.  Nose: Nose normal.  Mouth/Throat: Oropharynx is clear and moist and mucous membranes are normal.  Eyes: Conjunctivae and EOM are normal. Pupils are equal, round, and reactive to light.  Neck: Normal range of motion. Neck supple.  Cardiovascular: Regular rhythm, S1 normal and S2 normal.  Exam  reveals no gallop and no friction rub.   No murmur heard. Pulmonary/Chest: Effort normal and breath sounds normal. No respiratory distress. She exhibits no tenderness.  Abdominal: Soft. Normal appearance and bowel sounds are normal. There is no hepatosplenomegaly. There is no tenderness. There is no rebound, no guarding, no tenderness at McBurney's point and negative Murphy's sign. No hernia.  Musculoskeletal: Normal range of motion. She exhibits edema.  Neurological: She is alert and oriented to person, place, and time. She has normal strength. No cranial nerve deficit or sensory deficit. Coordination normal. GCS eye subscore is 4. GCS verbal subscore is 5. GCS motor subscore is 6.  Skin: Skin is warm, dry and intact. No rash noted. No cyanosis.  Psychiatric: She has a normal mood and affect. Her speech is normal and behavior is normal. Thought content normal.  Nursing note and vitals reviewed.   ED Course  Procedures (including critical care time) Labs Review Labs Reviewed  CBC WITH DIFFERENTIAL/PLATELET - Abnormal; Notable for the following:    WBC 12.7 (*)    Hemoglobin 15.9 (*)    HCT 46.4 (*)    Neutrophils Relative % 83 (*)    Neutro Abs 10.5 (*)    Lymphocytes Relative 7 (*)    Monocytes Absolute 1.3 (*)    All other components within normal limits  COMPREHENSIVE METABOLIC PANEL - Abnormal; Notable for the following:    Glucose, Bld 171 (*)    Creatinine, Ser 1.16 (*)    GFR calc non Af Amer 45 (*)    GFR calc Af Amer 52 (*)    All other components within normal limits  URINALYSIS, ROUTINE W REFLEX MICROSCOPIC (NOT AT [facility]) - Abnormal; Notable for the following:    Color, Urine AMBER (*)    APPearance CLOUDY (*)    Bilirubin Urine SMALL (*)    Ketones, ur 15 (*)    Protein, ur >300 (*)    Leukocytes, UA SMALL (*)    All other components within normal limits  BRAIN NATRIURETIC PEPTIDE - Abnormal; Notable for the following:  B Natriuretic Peptide 224.0 (*)    All other  components within normal limits  URINE MICROSCOPIC-ADD ON - Abnormal; Notable for the following:    Squamous Epithelial / LPF MANY (*)    Casts HYALINE CASTS (*)    All other components within normal limits  GLUCOSE, CAPILLARY - Abnormal; Notable for the following:    Glucose-Capillary 123 (*)    All other components within normal limits  PROTIME-INR - Abnormal; Notable for the following:    Prothrombin Time 17.0 (*)    All other components within normal limits  APTT - Abnormal; Notable for the following:    aPTT 42 (*)    All other components within normal limits  SEDIMENTATION RATE - Abnormal; Notable for the following:    Sed Rate 55 (*)    All other components within normal limits  C-REACTIVE PROTEIN - Abnormal; Notable for the following:    CRP 8.0 (*)    All other components within normal limits  T4, FREE - Abnormal; Notable for the following:    Free T4 1.27 (*)    All other components within normal limits  CBC WITH DIFFERENTIAL/PLATELET - Abnormal; Notable for the following:    RDW 15.7 (*)    Monocytes Relative 17 (*)    Monocytes Absolute 1.6 (*)    All other components within normal limits  COMPREHENSIVE METABOLIC PANEL - Abnormal; Notable for the following:    Chloride 100 (*)    Glucose, Bld 105 (*)    Total Protein 6.2 (*)    Albumin 2.9 (*)    Total Bilirubin 1.3 (*)    GFR calc non Af Amer 56 (*)    All other components within normal limits  GLUCOSE, CAPILLARY - Abnormal; Notable for the following:    Glucose-Capillary 102 (*)    All other components within normal limits  GLUCOSE, CAPILLARY - Abnormal; Notable for the following:    Glucose-Capillary 118 (*)    All other components within normal limits  I-STAT CG4 LACTIC ACID, ED - Abnormal; Notable for the following:    Lactic Acid, Venous 2.04 (*)    All other components within normal limits  URINE CULTURE  CULTURE, BLOOD (ROUTINE X 2)  CULTURE, BLOOD (ROUTINE X 2)  LACTIC ACID, PLASMA  PROCALCITONIN   CORTISOL  HEMOGLOBIN A1C    Imaging Review Dg Chest 2 View  03/28/2015   CLINICAL DATA:  74 year old female with hypotension.  EXAM: CHEST  2 VIEW  COMPARISON:  Radiograph dated 02/05/2015  FINDINGS: Two views of the chest demonstrate stable increased interstitial prominence. No focal consolidation. There is apparent blunting of the left costophrenic angle which may be artifactual or represent a small pleural effusion. There is stable mild cardiomegaly. Left pectoral pacemaker device noted. Osteopenia with degenerative changes of the spine. Mild multilevel mid and lower thoracic compression fractures, likely old.  IMPRESSION: Stable cardiomegaly and interstitial prominence. No focal consolidation.   Electronically Signed   By: Anner Crete M.D.   On: 03/28/2015 19:47     EKG Interpretation   Date/Time:  Tuesday March 28 2015 19:08:27 EDT Ventricular Rate:  99 PR Interval:    QRS Duration: 114 QT Interval:  396 QTC Calculation: 508 R Axis:   147 Text Interpretation:  Atrial fibrillation Abnormal lateral Q waves  Borderline repol abnormality, diffuse leads Prolonged QT interval No  significant change since last tracing Confirmed by Alani Sabbagh  MD,  Lathyn Griggs [phone]) on 03/28/2015 7:57:26 PM  MDM   Final diagnoses:  Edema   fever and chills  Possible pacemaker infection  Patient sent to the emergency department by her cardiologist for evaluation of possible infection. He was concerned about generalized infection as well as possible specific infection of her pacemaker. Patient has been feeling sick for several days experiencing possible fever, chills at home. When she was evaluated in the office she was hypotensive, refer to the ER. Since arrival in the ER her hypotension has resolved. Her workup has been largely unremarkable. No obvious source of infection found. Case was discussed with the hospitalist, it was felt that the patient would be best observed overnight to further  evaluate, await cultures, etc.    Orpah Greek, MD 03/29/15 1501

## 2015-03-28 NOTE — Progress Notes (Signed)
Spoke with Amy, ER triage -- reported pt being sent there via private vehicle to evaluate hypotension, nausea, chills, and poor blood sugar control per Dr. Caryl Comes. She advised to inform patient there is several hour wait at this time.

## 2015-03-28 NOTE — Progress Notes (Signed)
Patient Care Team: Dibas Koirala, MD as PCP - General (Family Medicine) Dibas Koirala, MD (Family Medicine) Dibas Koirala, MD (Family Medicine)   HPI  [name] is a 74 y.o. female seen in followup for pacemaker implanted for tachybradycardia syndrome as well as permanent atrial fibrillation with a somewhat rapid ventricular response.    she is status post Pulm Vein isolation at [place] 2009.   She also has hypertension, obesity as well as sleep apnea   Right and left heart catheterization demonstrating stable stenosis of the proximal LAD and interval worsening of a diagonal branch. She saw Dr. Burt Knack 10/13 to discuss PCI and it was elected because of the complex anatomy to pursue medical therapy.   She has had variable assessment of LV function.  Echo  9/13 EF 30-35%. Global 4 chamber hypokinesis last month 9/15 EF 45-50%   LAD (59/2.3)  She is on anticoagulation so antiplatelet therapy was not initiated   Struggled with exercise intolerance. It was thought potentially to be an ischemic equivalent setting of non-revascularizable coronary disease and ranolazine was attempted. This was ineffective and a strategy of augmented rate control was pursued  She has been hospitalized intercurrently for shortness of breath which is probably largely attributed able to heart failure. There were recommendations from pulmonary to decrease her beta blocker; given her rapid rates this may be a challenge.  She was socially discharged to rehabilitation at home. Over the last 5 days or so she has noted low blood pressures in the 80s and 90s. She is normally quite hypertensive. Blood sugars below. She's had some nausea and some chills without riders.   Past Medical History  Diagnosis Date  . Nonischemic cardiomyopathy     history of,  EF 20-25% at left heart cath in 06/2007; echo 2011 had normal LV function  . Chronic diastolic heart failure     Echo 06/2010: Moderate LVH, EF  55-65%, normal wall motion, mild MR, moderate to severe LAE, mild RAE.   . Morbid obesity   . Hyperlipidemia   . Tachy-brady syndrome     status post implant of a medtronic dual-chamber pacemaker in 2001.  explanted in 2010 -- [place] pacemaker in 2010.  Marland Kitchen Chronic obstructive pulmonary disease   . Osteoporosis   . Depression   . GERD (gastroesophageal reflux disease)   . Crohn's disease   . Hypothyroidism     treated  . Seasonal allergies   . Pacemaker     Medtronic  . Atrial fibrillation     Status post pulmonary vein isolation 2009 at [facility]  . CAD (coronary artery disease)     LHC 05/2007: pLAD 70-80%, pRCA 40%, EF 20-25%. LAD lesion treated medically.   . Coronary artery disease 06/15/2012  . Atrial fibrillation 12/18/2007    Annotation: refractory Qualifier: Diagnosis of  By: Doy Mince LPN, Megan    . Morbid obesity with BMI of 40.0-44.9, adult 06/22/2012  . Diabetes mellitus 06/22/2012  . Atrial fibrillation   . Diabetes mellitus without complication   . COPD (chronic obstructive pulmonary disease)   . Gout   . Dysrhythmia     atrial fib  . Peripheral vascular disease 15    rt arm clots  . Obstructive sleep apnea     continuous positive airway pressure not using at present  . Shortness of breath dyspnea     exersion    Past Surgical History  Procedure Laterality Date  .  Pulmonary vein isolation  05/12/2008    RFCA atrial fibrillation  . Post ablation pseudoaneurysm      at A fib ablation  . Pacemaker insertion  2010    medtronic ADAPTA   . Tonsillectomy    . Embolectomy Right 08/16/2014    Procedure: EMBOLECTOMY- RIGHT BRACHIAL ARTERY;  Surgeon: Serafina Mitchell, MD;  Location: [facility] OR;  Service: Vascular;  Laterality: Right;  . Cardiac catheterization  08/2000    noncritical disease mid RCA, EF preserved  . Cardiac catheterization  05/29/2007    noncritical disease, EF 20-25%  . Eye surgery Right 16    cataracts  lft yrs ago  .  Parathyroidectomy Right 01/30/2015    Procedure: PARATHYROIDECTOMY;  Surgeon: Armandina Gemma, MD;  Location: [place];  Service: General;  Laterality: Right;    Current Outpatient Prescriptions  Medication Sig Dispense Refill  . albuterol (PROVENTIL HFA;VENTOLIN HFA) 108 (90 BASE) MCG/ACT inhaler Inhale 2 puffs into the lungs every 6 (six) hours as needed for wheezing or shortness of breath.    Marland Kitchen atorvastatin (LIPITOR) 80 MG tablet Take 80 mg by mouth at bedtime.      . B-D INS SYRINGE 0.5CC/31GX5/16 31G X 5/16" 0.5 ML MISC as directed.    Marland Kitchen ELIQUIS 5 MG TABS tablet TAKE 1 TABLET BY MOUTH TWICE DAILY 180 tablet 1  . Fiber CAPS Take by mouth 2 (two) times daily.     . Fluticasone-Salmeterol (ADVAIR) 250-50 MCG/DOSE AEPB Inhale 1 puff into the lungs every 12 (twelve) hours. 60 each 6  . FREESTYLE LITE test strip as directed.    . furosemide (LASIX) 40 MG tablet 1 tablet by mouth in the am & 2 tablets by mouth in the pm    . insulin glargine (LANTUS) 100 UNIT/ML injection Inject 30 Units into the skin every morning.     Marland Kitchen ipratropium-albuterol (DUONEB) 0.5-2.5 (3) MG/3ML SOLN Take 3 mLs by nebulization every 6 (six) hours as needed (wheezing and or shortness of breath). 360 mL 0  . KLOR-CON M20 20 MEQ tablet TAKE 1 TABLET BY MOUTH TWICE DAILY 180 tablet 1  . Lancets (FREESTYLE) lancets as directed.    Marland Kitchen levothyroxine (SYNTHROID, LEVOTHROID) 75 MCG tablet Take 75 mcg by mouth daily.    Marland Kitchen loratadine (CLARITIN) 10 MG tablet Take 10 mg by mouth as needed for allergies.     Marland Kitchen losartan (COZAAR) 50 MG tablet Take 50 mg by mouth daily.    . metoprolol (LOPRESSOR) 100 MG tablet TAKE 1 TABLET (100 MG TOTAL) BY MOUTH 2 (TWO) TIMES DAILY. 180 tablet 3  . Multiple Vitamins-Minerals (MULTIVITAMIN WITH MINERALS) tablet Take 1 tablet by mouth daily.    . nitroGLYCERIN (NITROSTAT) 0.4 MG SL tablet Place 1 tablet (0.4 mg total) under the tongue every 5 (five) minutes as needed for chest pain. 25 tablet 1  . omeprazole  (PRILOSEC OTC) 20 MG tablet Take 20 mg by mouth daily.      Marland Kitchen RANEXA 500 MG 12 hr tablet Take 1 tablet by mouth 2 (two) times daily.    Marland Kitchen sulfaSALAzine (AZULFIDINE) 500 MG tablet Take 1,000 mg by mouth 2 (two) times daily.  11  . Tiotropium Bromide Monohydrate (SPIRIVA RESPIMAT) 2.5 MCG/ACT AERS Inhale 2 puffs into the lungs daily. 4 g 6  . [DISCONTINUED] allopurinol (ZYLOPRIM) 100 MG tablet Take 100 mg by mouth daily.       No current facility-administered medications for this visit.    No Known Allergies  Review of Systems negative except from HPI and PMH  Physical Exam BP 80/52 mmHg  Pulse 114  Ht 5\' 11"  (1.803 m)  Wt  Well developed and well nourished in no acute distress HENT normal E scleral and icterus clear Neck Supple JVP flat; carotids brisk and full Clear to ausculation Irregular rate and rhythm, no murmurs gallops or rub Soft with active bowel sounds No clubbing cyanosis Trace Edema Alert and oriented, grossly normal motor and sensory function Skin Warm and Dry  ECG demonstrates atrial fibrillation rate of 85 Intervals/10/40  with right axis deviation  Ambulatory oxygen saturation testing today in the office demonstrated a saturation of 93%   Assessment and  Plan  HFpEF/HFrEF  Atrial fibrillation-permanent  Pacemaker The patient's device was interrogated.  The information was reviewed. No changes were made in the programming.   Dizziness with standing   CAD  Hypotension     she is atrial fibrillation with a rapid rate. this has not had a serious impact on her LV function although it may be having an impact on her functional status. She has pulmonary hypertension likely partly related to obesity sleep apnea and perhaps a degree of hypoxemia.  Right now the more immediate issue is that she is hypotensive with chills and nausea. This raises a concern about systemic infection. She has also been hypoglycemic for the last couple of days augmenting that  concern. The possibility of device infection needs to be addressed as well as other possible intra-abdominal processes giving rise to her GI symptoms.   We will send her to the hospital for further evaluation. She'll need blood cultures and possibly empiric Anti-biotics.  Repeat blood sugar this afternoon is 231

## 2015-03-28 NOTE — Progress Notes (Signed)
ANTICOAGULATION CONSULT NOTE - Initial Consult  Pharmacy Consult for Eliquis Indication: atrial fibrillation  No Known Allergies  Patient Measurements: Height: 5\' 11"  (180.3 cm) Weight: 276 lb 14.4 oz (125.601 kg) IBW/kg (Calculated) : 70.8  Vital Signs: Temp: 99.6 F (37.6 C) (06/28 2235) Temp Source: Oral (06/28 2235) BP: 125/77 mmHg (06/28 2235) Pulse Rate: 91 (06/28 2235)  Labs:  Recent Labs  03/28/15 1619  HGB 15.9*  HCT 46.4*  PLT 285  CREATININE 1.16*    Estimated Creatinine Clearance: 62.3 mL/min (by C-G formula based on Cr of 1.16).   Medical History: Past Medical History  Diagnosis Date  . Nonischemic cardiomyopathy     history of,  EF 20-25% at left heart cath in 06/2007; echo 2011 had normal LV function  . Chronic diastolic heart failure     Echo 06/2010: Moderate LVH, EF 55-65%, normal wall motion, mild MR, moderate to severe LAE, mild RAE.   . Morbid obesity   . Hyperlipidemia   . Tachy-brady syndrome     status post implant of a medtronic dual-chamber pacemaker in 2001.  explanted in 2010 -- [place] pacemaker in 2010.  Marland Kitchen Chronic obstructive pulmonary disease   . Osteoporosis   . Depression   . GERD (gastroesophageal reflux disease)   . Crohn's disease   . Hypothyroidism     treated  . Seasonal allergies   . Pacemaker     Medtronic  . Atrial fibrillation     Status post pulmonary vein isolation 2009 at [facility]  . CAD (coronary artery disease)     LHC 05/2007: pLAD 70-80%, pRCA 40%, EF 20-25%. LAD lesion treated medically.   . Coronary artery disease 06/15/2012  . Atrial fibrillation 12/18/2007    Annotation: refractory Qualifier: Diagnosis of  By: Doy Mince LPN, Megan    . Morbid obesity with BMI of 40.0-44.9, adult 06/22/2012  . Diabetes mellitus 06/22/2012  . Atrial fibrillation   . Diabetes mellitus without complication   . COPD (chronic obstructive pulmonary disease)   . Gout   . Dysrhythmia     atrial fib  .  Peripheral vascular disease 15    rt arm clots  . Obstructive sleep apnea     continuous positive airway pressure not using at present  . Shortness of breath dyspnea     exersion  . CHF (congestive heart failure)     Medications:  Prescriptions prior to admission  Medication Sig Dispense Refill Last Dose  . albuterol (PROVENTIL HFA;VENTOLIN HFA) 108 (90 BASE) MCG/ACT inhaler Inhale 2 puffs into the lungs every 6 (six) hours as needed for wheezing or shortness of breath.   over 30 days  . atorvastatin (LIPITOR) 80 MG tablet Take 80 mg by mouth at bedtime.     03/27/2015 at Unknown time  . ELIQUIS 5 MG TABS tablet TAKE 1 TABLET BY MOUTH TWICE DAILY 180 tablet 1 03/28/2015 at 1100  . Fiber CAPS Take 1 tablet by mouth at bedtime.    03/27/2015 at Unknown time  . Fluticasone-Salmeterol (ADVAIR) 250-50 MCG/DOSE AEPB Inhale 1 puff into the lungs every 12 (twelve) hours. 60 each 6 03/28/2015 at Unknown time  . furosemide (LASIX) 40 MG tablet 1 tablet by mouth in the am & 2 tablets by mouth in the pm   03/27/2015 at Unknown time  . insulin glargine (LANTUS) 100 UNIT/ML injection Inject 30 Units into the skin every morning.    03/28/2015 at Unknown time  . KLOR-CON M20 20  MEQ tablet TAKE 1 TABLET BY MOUTH TWICE DAILY 180 tablet 1 03/28/2015 at Unknown time  . levothyroxine (SYNTHROID, LEVOTHROID) 75 MCG tablet Take 75 mcg by mouth daily.   03/28/2015 at Unknown time  . loratadine (CLARITIN) 10 MG tablet Take 10 mg by mouth as needed for allergies.    over 30 days  . losartan (COZAAR) 50 MG tablet Take 50 mg by mouth at bedtime.    03/27/2015 at Unknown time  . metoprolol (LOPRESSOR) 100 MG tablet TAKE 1 TABLET (100 MG TOTAL) BY MOUTH 2 (TWO) TIMES DAILY. 180 tablet 3 03/28/2015 at 1100  . Multiple Vitamins-Minerals (MULTIVITAMIN WITH MINERALS) tablet Take 1 tablet by mouth daily.   03/28/2015 at Unknown time  . nitroGLYCERIN (NITROSTAT) 0.4 MG SL tablet Place 1 tablet (0.4 mg total) under the tongue every 5  (five) minutes as needed for chest pain. 25 tablet 1 not yet started  . omeprazole (PRILOSEC OTC) 20 MG tablet Take 20 mg by mouth daily.     03/28/2015 at Unknown time  . RANEXA 500 MG 12 hr tablet Take 1 tablet by mouth 2 (two) times daily.   03/28/2015 at Unknown time  . sulfaSALAzine (AZULFIDINE) 500 MG tablet Take 1,000 mg by mouth 2 (two) times daily.  11 03/28/2015 at Unknown time  . Tiotropium Bromide Monohydrate (SPIRIVA RESPIMAT) 2.5 MCG/ACT AERS Inhale 2 puffs into the lungs daily. 4 g 6 03/28/2015 at Unknown time  . ipratropium-albuterol (DUONEB) 0.5-2.5 (3) MG/3ML SOLN Take 3 mLs by nebulization every 6 (six) hours as needed (wheezing and or shortness of breath). (Patient not taking: Reported on 03/28/2015) 360 mL 0 Not Taking at Unknown time    Assessment: 74 y.o. female admitted with fever/hypotension, h/o Afib, to continue Eliquis    Plan:  Eliquis 5 mg BID  Caryl Pina 03/28/2015,11:50 PM

## 2015-03-28 NOTE — ED Notes (Signed)
Dr Waverly Ferrari in room with patient.

## 2015-03-29 ENCOUNTER — Encounter (HOSPITAL_COMMUNITY): Payer: Self-pay | Admitting: *Deleted

## 2015-03-29 DIAGNOSIS — E039 Hypothyroidism, unspecified: Secondary | ICD-10-CM | POA: Diagnosis not present

## 2015-03-29 DIAGNOSIS — J432 Centrilobular emphysema: Secondary | ICD-10-CM | POA: Diagnosis not present

## 2015-03-29 DIAGNOSIS — R55 Syncope and collapse: Secondary | ICD-10-CM | POA: Diagnosis not present

## 2015-03-29 DIAGNOSIS — I5022 Chronic systolic (congestive) heart failure: Secondary | ICD-10-CM | POA: Diagnosis not present

## 2015-03-29 DIAGNOSIS — E162 Hypoglycemia, unspecified: Secondary | ICD-10-CM

## 2015-03-29 DIAGNOSIS — I482 Chronic atrial fibrillation: Secondary | ICD-10-CM | POA: Diagnosis not present

## 2015-03-29 DIAGNOSIS — I4891 Unspecified atrial fibrillation: Secondary | ICD-10-CM | POA: Diagnosis not present

## 2015-03-29 LAB — CBC WITH DIFFERENTIAL/PLATELET
BASOS ABS: 0 10*3/uL (ref 0.0–0.1)
BASOS PCT: 0 % (ref 0–1)
EOS ABS: 0.1 10*3/uL (ref 0.0–0.7)
Eosinophils Relative: 1 % (ref 0–5)
HEMATOCRIT: 42.3 % (ref 36.0–46.0)
Hemoglobin: 14 g/dL (ref 12.0–15.0)
Lymphocytes Relative: 22 % (ref 12–46)
Lymphs Abs: 1.9 10*3/uL (ref 0.7–4.0)
MCH: 31 pg (ref 26.0–34.0)
MCHC: 33.1 g/dL (ref 30.0–36.0)
MCV: 93.6 fL (ref 78.0–100.0)
MONO ABS: 1.6 10*3/uL — AB (ref 0.1–1.0)
Monocytes Relative: 17 % — ABNORMAL HIGH (ref 3–12)
Neutro Abs: 5.4 10*3/uL (ref 1.7–7.7)
Neutrophils Relative %: 60 % (ref 43–77)
PLATELETS: 283 10*3/uL (ref 150–400)
RBC: 4.52 MIL/uL (ref 3.87–5.11)
RDW: 15.7 % — AB (ref 11.5–15.5)
WBC: 9 10*3/uL (ref 4.0–10.5)

## 2015-03-29 LAB — COMPREHENSIVE METABOLIC PANEL
ALT: 16 U/L (ref 14–54)
AST: 22 U/L (ref 15–41)
Albumin: 2.9 g/dL — ABNORMAL LOW (ref 3.5–5.0)
Alkaline Phosphatase: 70 U/L (ref 38–126)
Anion gap: 10 (ref 5–15)
BILIRUBIN TOTAL: 1.3 mg/dL — AB (ref 0.3–1.2)
BUN: 16 mg/dL (ref 6–20)
CALCIUM: 9.9 mg/dL (ref 8.9–10.3)
CHLORIDE: 100 mmol/L — AB (ref 101–111)
CO2: 26 mmol/L (ref 22–32)
CREATININE: 0.97 mg/dL (ref 0.44–1.00)
GFR calc Af Amer: >60 mL/min (ref >60)
GFR, EST NON AFRICAN AMERICAN: 56 mL/min — AB (ref >60)
Glucose, Bld: 105 mg/dL — ABNORMAL HIGH (ref 65–99)
Potassium: 3.5 mmol/L (ref 3.5–5.1)
Sodium: 136 mmol/L (ref 135–145)
Total Protein: 6.2 g/dL — ABNORMAL LOW (ref 6.5–8.1)

## 2015-03-29 LAB — GLUCOSE, CAPILLARY
GLUCOSE-CAPILLARY: 102 mg/dL — AB (ref 65–99)
GLUCOSE-CAPILLARY: 138 mg/dL — AB (ref 65–99)
GLUCOSE-CAPILLARY: 95 mg/dL (ref 65–99)
Glucose-Capillary: 118 mg/dL — ABNORMAL HIGH (ref 65–99)

## 2015-03-29 LAB — SEDIMENTATION RATE: Sed Rate: 55 mm/hr — ABNORMAL HIGH (ref 0–22)

## 2015-03-29 LAB — PROCALCITONIN: Procalcitonin: 0.22 ng/mL

## 2015-03-29 LAB — PROTIME-INR
INR: 1.37 (ref 0.00–1.49)
PROTHROMBIN TIME: 17 s — AB (ref 11.6–15.2)

## 2015-03-29 LAB — LACTIC ACID, PLASMA: LACTIC ACID, VENOUS: 1.1 mmol/L (ref 0.5–2.0)

## 2015-03-29 LAB — APTT: aPTT: 42 seconds — ABNORMAL HIGH (ref 24–37)

## 2015-03-29 LAB — C-REACTIVE PROTEIN: CRP: 8 mg/dL — AB (ref <1.0)

## 2015-03-29 LAB — CORTISOL: CORTISOL PLASMA: 7.4 ug/dL

## 2015-03-29 LAB — T4, FREE: FREE T4: 1.27 ng/dL — AB (ref 0.61–1.12)

## 2015-03-29 MED ORDER — METOPROLOL TARTRATE 100 MG PO TABS
100.0000 mg | ORAL_TABLET | Freq: Two times a day (BID) | ORAL | Status: DC
  Administered 2015-03-29 (×2): 100 mg via ORAL
  Filled 2015-03-29 (×4): qty 1

## 2015-03-29 MED ORDER — FUROSEMIDE 10 MG/ML IJ SOLN
60.0000 mg | Freq: Once | INTRAMUSCULAR | Status: AC
  Administered 2015-03-29: 60 mg via INTRAVENOUS

## 2015-03-29 MED ORDER — POTASSIUM CHLORIDE CRYS ER 20 MEQ PO TBCR
40.0000 meq | EXTENDED_RELEASE_TABLET | Freq: Two times a day (BID) | ORAL | Status: DC
  Administered 2015-03-29 – 2015-03-30 (×3): 40 meq via ORAL
  Filled 2015-03-29 (×4): qty 2

## 2015-03-29 NOTE — Progress Notes (Addendum)
PATIENT DETAILS Name: [name] Age: 74 y.o. Sex: female Date of Birth: [birth year] Admit Date: 03/28/2015 Admitting Physician Lavina Hamman, MD QZE:[id number],TMAUQ, MD  Subjective: Feels better, afebrile overnight.  Assessment/Plan: Principal Problem:  Near syncope: Suspect secondary to hypoglycemia. Follow.  Active Problems: Hypoglycemia: Resolved with reduction in Lantus to 20 units. CBG's stable, continue to follow.  Atrial fibrillation with RVR: Now rate controlled, continue metoprolol and Eliquis.Chads2vasc of atleast 5.  Fatigue and  generalized weakness: Occurring a few days prior to admission. Suspect likely secondary to hypoglycemia. No indication of infection at this time-await blood cultures.  Hypertension: Controlled with losartan, metoprolol and Lasix.  Chronic systolic heart failure: Compensated. Continue Lasix, metoprolol and losartan. Cardiology following  History of COPD with right-sided heart failure: Stable. Continue Spiriva and as needed nebs.  History of tachybradycardia syndrome: Permanent pacemaker in place. Cardiology following. Monitor in telemetry.  History of CAD: Without chest pain or shortness of breath. Continue statin and beta blocker. Suspect not on aspirin as on Eliquis.  Type 2 diabetes: Hypoglycemic prior to admission, after adjustment of Lantus down to 20 units, CBGs appear to be stable. Follow.  Hypothyroidism: Continue with levothyroxine.  History of Crohn's disease: Stable, continue with sulfasalazine  History of GERD: Continue PPI  Morbid obesity: Counseled importance of weight loss.  Disposition: Remain inpatient-if blood cultures remain negative-suspect home 6/30  Antimicrobial agents  See below  Anti-infectives    None      DVT Prophylaxis: Eliquis  Code Status: Full code  Family Communication Family member at bedside  Procedures: None  CONSULTS:  cardiology  Time spent 30  minutes-Greater than 50% of this time was spent in counseling, explanation of diagnosis, planning of further management, and coordination of care.  MEDICATIONS: Scheduled Meds: . apixaban  5 mg Oral BID  . atorvastatin  80 mg Oral QHS  . furosemide  60 mg Intravenous Once  . furosemide  40 mg Oral BID  . insulin aspart  0-15 Units Subcutaneous TID WC  . insulin aspart  0-5 Units Subcutaneous QHS  . insulin glargine  20 Units Subcutaneous BH-q7a  . levothyroxine  75 mcg Oral QAC breakfast  . losartan  50 mg Oral QHS  . metoprolol  100 mg Oral BID  . mometasone-formoterol  2 puff Inhalation BID  . pantoprazole  40 mg Oral Daily  . potassium chloride  40 mEq Oral BID  . ranolazine  500 mg Oral BID  . sodium chloride  3 mL Intravenous Q12H  . sulfaSALAzine  1,000 mg Oral BID  . tiotropium  18 mcg Inhalation Daily   Continuous Infusions:  PRN Meds:.acetaminophen **OR** acetaminophen, albuterol, ondansetron **OR** ondansetron (ZOFRAN) IV    PHYSICAL EXAM: Vital signs in last 24 hours: Filed Vitals:   03/29/15 0240 03/29/15 0608 03/29/15 0944 03/29/15 1100  BP: 128/67 122/62  93/44  Pulse: 88 86  85  Temp: 99.1 F (37.3 C) 98.9 F (37.2 C)    TempSrc: Oral Oral    Resp: 20 20  18   Height:      Weight:  125.402 kg (276 lb 7.4 oz)    SpO2: 98% 96% 92% 95%    Weight change:  Filed Weights   03/28/15 2235 03/29/15 0608  Weight: 125.601 kg (276 lb 14.4 oz) 125.402 kg (276 lb 7.4 oz)   Body mass index is 38.58 kg/(m^2).   Gen Exam: Awake and  alert with clear speech.   Neck: Supple, No JVD.   Chest: B/L Clear.   CVS: S1 S2 irregular, no murmurs.  Abdomen: soft, BS +, non tender, non distended.  Extremities: trace edema, lower extremities warm to touch. Neurologic: Non Focal.   Skin: No Rash.   Wounds: N/A.   Intake/Output from previous day:  Intake/Output Summary (Last 24 hours) at 03/29/15 1421 Last data filed at 03/29/15 1000  Gross per 24 hour  Intake    340 ml    Output    300 ml  Net     40 ml     LAB RESULTS: CBC  Recent Labs Lab 03/28/15 1619 03/29/15 0420  WBC 12.7* 9.0  HGB 15.9* 14.0  HCT 46.4* 42.3  PLT 285 283  MCV 94.5 93.6  MCH 32.4 31.0  MCHC 34.3 33.1  RDW 15.5 15.7*  LYMPHSABS 0.8 1.9  MONOABS 1.3* 1.6*  EOSABS 0.0 0.1  BASOSABS 0.0 0.0    Chemistries   Recent Labs Lab 03/28/15 1619 03/29/15 0420  NA 136 136  K 4.2 3.5  CL 103 100*  CO2 25 26  GLUCOSE 171* 105*  BUN 18 16  CREATININE 1.16* 0.97  CALCIUM 9.9 9.9    CBG:  Recent Labs Lab 03/28/15 2241 03/29/15 0602 03/29/15 1121  GLUCAP 123* 102* 118*    GFR Estimated Creatinine Clearance: 74.4 mL/min (by C-G formula based on Cr of 0.97).  Coagulation profile  Recent Labs Lab 03/29/15 0030  INR 1.37    Cardiac Enzymes No results for input(s): CKMB, TROPONINI, MYOGLOBIN in the last 168 hours.  Invalid input(s): CK  Invalid input(s): POCBNP No results for input(s): DDIMER in the last 72 hours. No results for input(s): HGBA1C in the last 72 hours. No results for input(s): CHOL, HDL, LDLCALC, TRIG, CHOLHDL, LDLDIRECT in the last 72 hours. No results for input(s): TSH, T4TOTAL, T3FREE, THYROIDAB in the last 72 hours.  Invalid input(s): FREET3 No results for input(s): VITAMINB12, FOLATE, FERRITIN, TIBC, IRON, RETICCTPCT in the last 72 hours. No results for input(s): LIPASE, AMYLASE in the last 72 hours.  Urine Studies No results for input(s): UHGB, CRYS in the last 72 hours.  Invalid input(s): UACOL, UAPR, USPG, UPH, UTP, UGL, UKET, UBIL, UNIT, UROB, ULEU, UEPI, UWBC, URBC, UBAC, CAST, UCOM, BILUA  MICROBIOLOGY: Recent Results (from the past 240 hour(s))  Urine culture     Status: None (Preliminary result)   Collection Time: 03/28/15  6:19 PM  Result Value Ref Range Status   Specimen Description URINE, CLEAN CATCH  Final   Special Requests NONE  Final   Culture CULTURE REINCUBATED FOR BETTER GROWTH  Final   Report Status PENDING   Incomplete  Culture, blood (routine x 2)     Status: None (Preliminary result)   Collection Time: 03/28/15  6:48 PM  Result Value Ref Range Status   Specimen Description BLOOD RIGHT HAND  Final   Special Requests BOTTLES DRAWN AEROBIC ONLY 5CC  Final   Culture NO GROWTH < 24 HOURS  Final   Report Status PENDING  Incomplete  Culture, blood (routine x 2)     Status: None (Preliminary result)   Collection Time: 03/28/15  6:59 PM  Result Value Ref Range Status   Specimen Description BLOOD RIGHT ARM  Final   Special Requests BOTTLES DRAWN AEROBIC AND ANAEROBIC 5CC  Final   Culture NO GROWTH < 24 HOURS  Final   Report Status PENDING  Incomplete    RADIOLOGY  STUDIES/RESULTS: Dg Chest 2 View  03/28/2015   CLINICAL DATA:  74 year old female with hypotension.  EXAM: CHEST  2 VIEW  COMPARISON:  Radiograph dated 02/05/2015  FINDINGS: Two views of the chest demonstrate stable increased interstitial prominence. No focal consolidation. There is apparent blunting of the left costophrenic angle which may be artifactual or represent a small pleural effusion. There is stable mild cardiomegaly. Left pectoral pacemaker device noted. Osteopenia with degenerative changes of the spine. Mild multilevel mid and lower thoracic compression fractures, likely old.  IMPRESSION: Stable cardiomegaly and interstitial prominence. No focal consolidation.   Electronically Signed   By: Anner Crete M.D.   On: 03/28/2015 19:47    Oren Binet, MD  [facility] Pager:[phone]  If 7PM-7AM, please contact night-coverage [email or web address] Password [email or web address] 03/29/2015, 2:21 PM     `

## 2015-03-29 NOTE — Progress Notes (Signed)
SUBJECTIVE: The patient is doing well today.  Her symptoms are much improved.  At this time, she denies chest pain, shortness of breath, or any new concerns.  She had a 2-3 day history of fatigue and weakness prior to office visit yesterday.   CURRENT MEDICATIONS: . apixaban  5 mg Oral BID  . atorvastatin  80 mg Oral QHS  . furosemide  40 mg Oral BID  . insulin aspart  0-15 Units Subcutaneous TID WC  . insulin aspart  0-5 Units Subcutaneous QHS  . insulin glargine  20 Units Subcutaneous BH-q7a  . levothyroxine  75 mcg Oral QAC breakfast  . losartan  50 mg Oral QHS  . metoprolol  100 mg Oral BID  . mometasone-formoterol  2 puff Inhalation BID  . pantoprazole  40 mg Oral Daily  . ranolazine  500 mg Oral BID  . sodium chloride  3 mL Intravenous Q12H  . sulfaSALAzine  1,000 mg Oral BID  . tiotropium  18 mcg Inhalation Daily       OBJECTIVE: Physical Exam: Filed Vitals:   03/28/15 2235 03/29/15 0240 03/29/15 0608 03/29/15 0944  BP: 125/77 128/67 122/62   Pulse: 91 88 86   Temp: 99.6 F (37.6 C) 99.1 F (37.3 C) 98.9 F (37.2 C)   TempSrc: Oral Oral Oral   Resp: $Remo'22 20 20   'eLVan$ Height: $Rem'5\' 11"'Huan$  (1.803 m)     Weight: 276 lb 14.4 oz (125.601 kg)  276 lb 7.4 oz (125.402 kg)   SpO2: 95% 98% 96% 92%    Intake/Output Summary (Last 24 hours) at 03/29/15 1034 Last data filed at 03/29/15 0900  Gross per 24 hour  Intake    240 ml  Output    300 ml  Net    -60 ml    Telemetry reveals atrial fibrillation, ventricular rates 90's  GEN- The patient is obese appearing, alert and oriented x 3 today.   Head- normocephalic, atraumatic Eyes-  Sclera clear, conjunctiva pink Ears- hearing intact Oropharynx- clear Neck- supple, no JVP Lymph- no cervical lymphadenopathy Lungs- Clear to ausculation bilaterally, normal work of breathing Heart- Irregular rate and rhythm  GI- soft, NT, ND, + BS Extremities- no clubbing, cyanosis, 1+ BLE edema Skin- no rash or lesion Psych- euthymic mood,  full affect Neuro- strength and sensation are intact  LABS: Basic Metabolic Panel:  Recent Labs  03/28/15 1619 03/29/15 0420  NA 136 136  K 4.2 3.5  CL 103 100*  CO2 25 26  GLUCOSE 171* 105*  BUN 18 16  CREATININE 1.16* 0.97  CALCIUM 9.9 9.9   Liver Function Tests:  Recent Labs  03/28/15 1619 03/29/15 0420  AST 25 22  ALT 20 16  ALKPHOS 81 70  BILITOT 1.2 1.3*  PROT 7.3 6.2*  ALBUMIN 3.5 2.9*   CBC:  Recent Labs  03/28/15 1619 03/29/15 0420  WBC 12.7* 9.0  NEUTROABS 10.5* 5.4  HGB 15.9* 14.0  HCT 46.4* 42.3  MCV 94.5 93.6  PLT 285 283    RADIOLOGY: Dg Chest 2 View 03/28/2015   CLINICAL DATA:  74 year old female with hypotension.  EXAM: CHEST  2 VIEW  COMPARISON:  Radiograph dated 02/05/2015  FINDINGS: Two views of the chest demonstrate stable increased interstitial prominence. No focal consolidation. There is apparent blunting of the left costophrenic angle which may be artifactual or represent a small pleural effusion. There is stable mild cardiomegaly. Left pectoral pacemaker device noted. Osteopenia with degenerative changes of the spine. Mild  multilevel mid and lower thoracic compression fractures, likely old.  IMPRESSION: Stable cardiomegaly and interstitial prominence. No focal consolidation.   Electronically Signed   By: Anner Crete M.D.   On: 03/28/2015 19:47    ASSESSMENT AND PLAN:  Principal Problem:   Near syncope Active Problems:   Atrial fibrillation   CARDIAC PACEMAKER IN SITU-MEDTRONIC ADAPTA L   Coronary artery disease   Diabetes mellitus   Body mass index (BMI) of 40.0-44.9 in adult   Chronic kidney disease, stage III (moderate)   Chronic obstructive pulmonary disease   Crohn's disease of large intestine without complication   Gastro-esophageal reflux disease without esophagitis   Essential (primary) hypertension   Hypothyroidism   Sleep apnea   Type 2 diabetes mellitus with diabetic nephropathy   1.  Permanent atrial  fibrillation The patient has permanent atrial fibrillation and is status post pacemaker implant for tachy/brady syndrome.   Continue Eliquis for CHADS2VASC of at least 5 Ventricular rates relatively well controlled. Continue Metoprolol.   No changes   2.  Chronic systolic heart failure Continue medical therapy BLE edema this morning Continue lasix - will give additional $RemoveBeforeD'60mg'[id number]$  IV x1 today - discussed with Dr Lovena Le  3.  Near syncope No ventricular arrhythmias on device interrogation Unclear etiology CRP and ESR elevated, lactate elevated on admission, but improved this morning WBC normal Continue to monitor blood cultures - original dual chamber PPM was implanted in 2001; gen change with new RV lead 2010 by Dr Tyrone Apple, NP 03/29/2015 10:40 AM

## 2015-03-30 DIAGNOSIS — R55 Syncope and collapse: Secondary | ICD-10-CM | POA: Diagnosis not present

## 2015-03-30 DIAGNOSIS — E162 Hypoglycemia, unspecified: Secondary | ICD-10-CM | POA: Diagnosis not present

## 2015-03-30 DIAGNOSIS — E039 Hypothyroidism, unspecified: Secondary | ICD-10-CM | POA: Diagnosis not present

## 2015-03-30 DIAGNOSIS — I482 Chronic atrial fibrillation: Secondary | ICD-10-CM | POA: Diagnosis not present

## 2015-03-30 LAB — URINE CULTURE

## 2015-03-30 LAB — BASIC METABOLIC PANEL
Anion gap: 10 (ref 5–15)
BUN: 16 mg/dL (ref 6–20)
CALCIUM: 10.1 mg/dL (ref 8.9–10.3)
CHLORIDE: 100 mmol/L — AB (ref 101–111)
CO2: 27 mmol/L (ref 22–32)
Creatinine, Ser: 1.08 mg/dL — ABNORMAL HIGH (ref 0.44–1.00)
GFR, EST AFRICAN AMERICAN: 57 mL/min — AB (ref >60)
GFR, EST NON AFRICAN AMERICAN: 49 mL/min — AB (ref >60)
Glucose, Bld: 111 mg/dL — ABNORMAL HIGH (ref 65–99)
Potassium: 4.1 mmol/L (ref 3.5–5.1)
Sodium: 137 mmol/L (ref 135–145)

## 2015-03-30 LAB — GLUCOSE, CAPILLARY
Glucose-Capillary: 104 mg/dL — ABNORMAL HIGH (ref 65–99)
Glucose-Capillary: 138 mg/dL — ABNORMAL HIGH (ref 65–99)

## 2015-03-30 LAB — HEMOGLOBIN A1C
HEMOGLOBIN A1C: 7 % — AB (ref 4.8–5.6)
Mean Plasma Glucose: 154 mg/dL

## 2015-03-30 MED ORDER — FUROSEMIDE 10 MG/ML IJ SOLN
40.0000 mg | Freq: Once | INTRAMUSCULAR | Status: AC
  Administered 2015-03-30: 40 mg via INTRAVENOUS

## 2015-03-30 MED ORDER — PANTOPRAZOLE SODIUM 40 MG PO TBEC
40.0000 mg | DELAYED_RELEASE_TABLET | Freq: Every day | ORAL | Status: DC
  Administered 2015-03-30: 40 mg via ORAL
  Filled 2015-03-30: qty 1

## 2015-03-30 MED ORDER — INSULIN GLARGINE 100 UNIT/ML ~~LOC~~ SOLN: 20.0000 [IU] | SUBCUTANEOUS | Status: DC

## 2015-03-30 NOTE — Discharge Summary (Signed)
PATIENT DETAILS Name: [name] Age: 74 y.o. Sex: female Date of Birth: 22-Dec-1940 MRN: [id number]. Admitting Physician: Lavina Hamman, MD ZDG:LOVFIEP,PIRJJ, MD  Admit Date: 03/28/2015 Discharge date: 03/30/2015  Recommendations for Outpatient Follow-up:  1. Note-Lantus decreased to 20 units. 2. Please follow blood cultures till final-negative at the time of discharge  PRIMARY DISCHARGE DIAGNOSIS:  Principal Problem:   Near syncope Active Problems:   Atrial fibrillation   CARDIAC PACEMAKER IN SITU-MEDTRONIC ADAPTA L   Coronary artery disease   Diabetes mellitus   Body mass index (BMI) of 40.0-44.9 in adult   Chronic kidney disease, stage III (moderate)   Chronic obstructive pulmonary disease   Crohn's disease of large intestine without complication   Gastro-esophageal reflux disease without esophagitis   Essential (primary) hypertension   Hypothyroidism   Sleep apnea   Type 2 diabetes mellitus with diabetic nephropathy      PAST MEDICAL HISTORY: Past Medical History  Diagnosis Date  . Nonischemic cardiomyopathy     history of,  EF 20-25% at left heart cath in 06/2007; echo 2011 had normal LV function  . Chronic diastolic heart failure     Echo 06/2010: Moderate LVH, EF 55-65%, normal wall motion, mild MR, moderate to severe LAE, mild RAE.   . Morbid obesity   . Hyperlipidemia   . Tachy-brady syndrome     status post implant of a medtronic dual-chamber pacemaker in 2001.  explanted in 2010 -- [place] pacemaker in 2010.  Marland Kitchen Chronic obstructive pulmonary disease   . Osteoporosis   . Depression   . GERD (gastroesophageal reflux disease)   . Crohn's disease   . Hypothyroidism     treated  . Seasonal allergies   . Pacemaker     Medtronic  . Atrial fibrillation     Status post pulmonary vein isolation 2009 at [facility]  . CAD (coronary artery disease)     LHC 05/2007: pLAD 70-80%, pRCA 40%, EF 20-25%. LAD lesion treated medically.   .  Coronary artery disease 06/15/2012  . Atrial fibrillation 12/18/2007    Annotation: refractory Qualifier: Diagnosis of  By: Doy Mince LPN, Megan    . Morbid obesity with BMI of 40.0-44.9, adult 06/22/2012  . Diabetes mellitus 06/22/2012  . Atrial fibrillation   . Diabetes mellitus without complication   . COPD (chronic obstructive pulmonary disease)   . Gout   . Dysrhythmia     atrial fib  . Peripheral vascular disease 15    rt arm clots  . Obstructive sleep apnea     continuous positive airway pressure not using at present  . Shortness of breath dyspnea     exersion  . CHF (congestive heart failure)     DISCHARGE MEDICATIONS: Current Discharge Medication List    CONTINUE these medications which have CHANGED   Details  insulin glargine (LANTUS) 100 UNIT/ML injection Inject 0.2 mLs (20 Units total) into the skin every morning.      CONTINUE these medications which have NOT CHANGED   Details  albuterol (PROVENTIL HFA;VENTOLIN HFA) 108 (90 BASE) MCG/ACT inhaler Inhale 2 puffs into the lungs every 6 (six) hours as needed for wheezing or shortness of breath.    atorvastatin (LIPITOR) 80 MG tablet Take 80 mg by mouth at bedtime.      ELIQUIS 5 MG TABS tablet TAKE 1 TABLET BY MOUTH TWICE DAILY Qty: 180 tablet, Refills: 1    Fiber CAPS Take 1 tablet by mouth at bedtime.  Fluticasone-Salmeterol (ADVAIR) 250-50 MCG/DOSE AEPB Inhale 1 puff into the lungs every 12 (twelve) hours. Qty: 60 each, Refills: 6    furosemide (LASIX) 40 MG tablet 1 tablet by mouth in the am & 2 tablets by mouth in the pm    KLOR-CON M20 20 MEQ tablet TAKE 1 TABLET BY MOUTH TWICE DAILY Qty: 180 tablet, Refills: 1    levothyroxine (SYNTHROID, LEVOTHROID) 75 MCG tablet Take 75 mcg by mouth daily.    loratadine (CLARITIN) 10 MG tablet Take 10 mg by mouth as needed for allergies.     losartan (COZAAR) 50 MG tablet Take 50 mg by mouth at bedtime.     metoprolol (LOPRESSOR) 100 MG tablet TAKE 1 TABLET (100 MG  TOTAL) BY MOUTH 2 (TWO) TIMES DAILY. Qty: 180 tablet, Refills: 3    Multiple Vitamins-Minerals (MULTIVITAMIN WITH MINERALS) tablet Take 1 tablet by mouth daily.    nitroGLYCERIN (NITROSTAT) 0.4 MG SL tablet Place 1 tablet (0.4 mg total) under the tongue every 5 (five) minutes as needed for chest pain. Qty: 25 tablet, Refills: 1   Associated Diagnoses: Atherosclerosis of native coronary artery of native heart with angina pectoris; Chronic atrial fibrillation    omeprazole (PRILOSEC OTC) 20 MG tablet Take 20 mg by mouth daily.      RANEXA 500 MG 12 hr tablet Take 1 tablet by mouth 2 (two) times daily.    sulfaSALAzine (AZULFIDINE) 500 MG tablet Take 1,000 mg by mouth 2 (two) times daily. Refills: 11    Tiotropium Bromide Monohydrate (SPIRIVA RESPIMAT) 2.5 MCG/ACT AERS Inhale 2 puffs into the lungs daily. Qty: 4 g, Refills: 6    ipratropium-albuterol (DUONEB) 0.5-2.5 (3) MG/3ML SOLN Take 3 mLs by nebulization every 6 (six) hours as needed (wheezing and or shortness of breath). Qty: 360 mL, Refills: 0        ALLERGIES:  No Known Allergies  BRIEF HPI:  See H&P, Labs, Consult and Test reports for all details in brief, patient is a 74 year old female with chronic systolic heart failure, tachybradycardia syndrome status post permanent pacemaker implantation, diabetes who presented with fatigue, chills. She was subsequently admitted for further evaluation and treatment.  CONSULTATIONS:   cardiology  PERTINENT RADIOLOGIC STUDIES: Dg Chest 2 View  03/28/2015   CLINICAL DATA:  74 year old female with hypotension.  EXAM: CHEST  2 VIEW  COMPARISON:  Radiograph dated 02/05/2015  FINDINGS: Two views of the chest demonstrate stable increased interstitial prominence. No focal consolidation. There is apparent blunting of the left costophrenic angle which may be artifactual or represent a small pleural effusion. There is stable mild cardiomegaly. Left pectoral pacemaker device noted. Osteopenia with  degenerative changes of the spine. Mild multilevel mid and lower thoracic compression fractures, likely old.  IMPRESSION: Stable cardiomegaly and interstitial prominence. No focal consolidation.   Electronically Signed   By: Anner Crete M.D.   On: 03/28/2015 19:47     PERTINENT LAB RESULTS: CBC:  Recent Labs  03/28/15 1619 03/29/15 0420  WBC 12.7* 9.0  HGB 15.9* 14.0  HCT 46.4* 42.3  PLT 285 283   CMET CMP     Component Value Date/Time   NA 137 03/30/2015 0411   K 4.1 03/30/2015 0411   CL 100* 03/30/2015 0411   CO2 27 03/30/2015 0411   GLUCOSE 111* 03/30/2015 0411   BUN 16 03/30/2015 0411   CREATININE 1.08* 03/30/2015 0411   CALCIUM 10.1 03/30/2015 0411   CALCIUM 10.0 06/06/2007 0420   PROT 6.2* 03/29/2015 0420  ALBUMIN 2.9* 03/29/2015 0420   AST 22 03/29/2015 0420   ALT 16 03/29/2015 0420   ALKPHOS 70 03/29/2015 0420   BILITOT 1.3* 03/29/2015 0420   GFRNONAA 49* 03/30/2015 0411   GFRAA 57* 03/30/2015 0411    GFR Estimated Creatinine Clearance: 66.2 mL/min (by C-G formula based on Cr of 1.08). No results for input(s): LIPASE, AMYLASE in the last 72 hours. No results for input(s): CKTOTAL, CKMB, CKMBINDEX, TROPONINI in the last 72 hours. Invalid input(s): POCBNP No results for input(s): DDIMER in the last 72 hours.  Recent Labs  03/29/15 0030  HGBA1C 7.0*   No results for input(s): CHOL, HDL, LDLCALC, TRIG, CHOLHDL, LDLDIRECT in the last 72 hours. No results for input(s): TSH, T4TOTAL, T3FREE, THYROIDAB in the last 72 hours.  Invalid input(s): FREET3 No results for input(s): VITAMINB12, FOLATE, FERRITIN, TIBC, IRON, RETICCTPCT in the last 72 hours. Coags:  Recent Labs  03/29/15 0030  INR 1.37   Microbiology: Recent Results (from the past 240 hour(s))  Urine culture     Status: None (Preliminary result)   Collection Time: 03/28/15  6:19 PM  Result Value Ref Range Status   Specimen Description URINE, CLEAN CATCH  Final   Special Requests NONE   Final   Culture CULTURE REINCUBATED FOR BETTER GROWTH  Final   Report Status PENDING  Incomplete  Culture, blood (routine x 2)     Status: None (Preliminary result)   Collection Time: 03/28/15  6:48 PM  Result Value Ref Range Status   Specimen Description BLOOD RIGHT HAND  Final   Special Requests BOTTLES DRAWN AEROBIC ONLY 5CC  Final   Culture NO GROWTH < 24 HOURS  Final   Report Status PENDING  Incomplete  Culture, blood (routine x 2)     Status: None (Preliminary result)   Collection Time: 03/28/15  6:59 PM  Result Value Ref Range Status   Specimen Description BLOOD RIGHT ARM  Final   Special Requests BOTTLES DRAWN AEROBIC AND ANAEROBIC 5CC  Final   Culture NO GROWTH < 24 HOURS  Final   Report Status PENDING  Incomplete     BRIEF HOSPITAL COURSE:  Near syncope: Suspect secondary to hypoglycemia. Telemetry unremarkable-continues to be in atrial fibrillation which is a chronic issue. Seen by cardiology during this hospitalization.  Active Problems: Hypoglycemia: Resolved with reduction in Lantus to 20 units. CBG's stable, continue to follow closely in the outpatient setting. Asked patient to follow-up with her PCP primary endocrinologist  Atrial fibrillation with RVR: Now rate controlled, continue metoprolol and Eliquis.Chads2vasc of atleast 5.  Fatigue and generalized weakness: Occurring a few days prior to admission. Suspect likely secondary to hypoglycemia. No indication of infection at this time- blood cultures negative at the time of discharge.  Hypertension: Controlled with losartan, metoprolol and Lasix.  Chronic systolic heart failure: Compensated. Continue Lasix, metoprolol and losartan.   History of COPD with right-sided heart failure: Stable. Continue Spiriva and as needed nebs.  History of tachybradycardia syndrome: Permanent pacemaker in place. Monitored in telemetry.  History of CAD: Without chest pain or shortness of breath. Continue statin and beta blocker.  Suspect not on aspirin as on Eliquis.  Type 2 diabetes: Hypoglycemic prior to admission, after adjustment of Lantus down to 20 units, CBGs appear to be stable. Follow closely in the outpatient setting.  Hypothyroidism: Continue with levothyroxine.  History of Crohn's disease: Stable, continue with sulfasalazine  History of GERD: Continue PPI  Morbid obesity: Counseled importance of weight loss.  TODAY-DAY  OF DISCHARGE:  Subjective:   [name] today has no headache,no chest abdominal pain,no new weakness tingling or numbness, feels much better wants to go home today.   Objective:   Blood pressure 95/49, pulse 81, temperature 98.4 F (36.9 C), temperature source Oral, resp. rate 18, height 5\' 11"  (1.803 m), weight 123.061 kg (271 lb 4.8 oz), SpO2 95 %.  Intake/Output Summary (Last 24 hours) at 03/30/15 1021 Last data filed at 03/30/15 1000  Gross per 24 hour  Intake   1060 ml  Output   2650 ml  Net  -1590 ml   Filed Weights   03/28/15 2235 03/29/15 0608 03/30/15 0631  Weight: 125.601 kg (276 lb 14.4 oz) 125.402 kg (276 lb 7.4 oz) 123.061 kg (271 lb 4.8 oz)    Exam Awake Alert, Oriented *3, No new F.N deficits, Normal affect [place].AT,PERRAL Supple Neck,No JVD, No cervical lymphadenopathy appriciated.  Symmetrical Chest wall movement, Good air movement bilaterally, CTAB RRR,No Gallops,Rubs or new Murmurs, No Parasternal Heave +ve B.Sounds, Abd Soft, Non tender, No organomegaly appriciated, No rebound -guarding or rigidity. No Cyanosis, Clubbing or edema, No new Rash or bruise  DISCHARGE CONDITION: Stable  DISPOSITION: Home  DISCHARGE INSTRUCTIONS:    Activity:  As tolerated with Full fall precautions use walker/cane & assistance as needed  Diet recommendation: Diabetic Diet Heart Healthy diet  Discharge Instructions    (HEART FAILURE PATIENTS) Call MD:  Anytime you have any of the following symptoms: 1) 3 pound weight gain in 24 hours or 5 pounds in 1 week 2)  shortness of breath, with or without a dry hacking cough 3) swelling in the hands, feet or stomach 4) if you have to sleep on extra pillows at night in order to breathe.    Complete by:  As directed      Call MD for:  extreme fatigue    Complete by:  As directed      Call MD for:  persistant dizziness or light-headedness    Complete by:  As directed      Diet - low sodium heart healthy    Complete by:  As directed      Diet Carb Modified    Complete by:  As directed      Increase activity slowly    Complete by:  As directed            Follow-up Information    Follow up with [name], MD On 04/07/2015.   Specialty:  Family Medicine   Why:  @ 11:00 AM confirmed appointment with  [place] information:   [address] [address] [phone]       Follow up with KERR,JEFFREY, MD. Schedule an appointment as soon as possible for a visit in 1 week.   Specialty:  Endocrinology   Contact information:   [address] [place] [address] [phone]      Total Time spent on discharge equals 25 minutes.  SignedOren Binet 03/30/2015 10:21 AM

## 2015-03-30 NOTE — Progress Notes (Signed)
Patient is active with [facility] as prior to admission for HHPT; orders placed to resume HHPT; B Pennie Rushing [phone]

## 2015-03-30 NOTE — Progress Notes (Signed)
Patient given discharge instructions and all questions answered.  Patient discharged via wheelchair with all belongings.   

## 2015-03-30 NOTE — Progress Notes (Signed)
Patient Name: [name]      SUBJECTIVE: MUCH IMPROVED No localizing complaints still withe edema   Past Medical History  Diagnosis Date  . Nonischemic cardiomyopathy     history of,  EF 20-25% at left heart cath in 06/2007; echo 2011 had normal LV function  . Chronic diastolic heart failure     Echo 06/2010: Moderate LVH, EF 55-65%, normal wall motion, mild MR, moderate to severe LAE, mild RAE.   . Morbid obesity   . Hyperlipidemia   . Tachy-brady syndrome     status post implant of a medtronic dual-chamber pacemaker in 2001.  explanted in 2010 -- [place] pacemaker in 2010.  Marland Kitchen Chronic obstructive pulmonary disease   . Osteoporosis   . Depression   . GERD (gastroesophageal reflux disease)   . Crohn's disease   . Hypothyroidism     treated  . Seasonal allergies   . Pacemaker     Medtronic  . Atrial fibrillation     Status post pulmonary vein isolation 2009 at [facility]  . CAD (coronary artery disease)     LHC 05/2007: pLAD 70-80%, pRCA 40%, EF 20-25%. LAD lesion treated medically.   . Coronary artery disease 06/15/2012  . Atrial fibrillation 12/18/2007    Annotation: refractory Qualifier: Diagnosis of  By: Doy Mince LPN, Megan    . Morbid obesity with BMI of 40.0-44.9, adult 06/22/2012  . Diabetes mellitus 06/22/2012  . Atrial fibrillation   . Diabetes mellitus without complication   . COPD (chronic obstructive pulmonary disease)   . Gout   . Dysrhythmia     atrial fib  . Peripheral vascular disease 15    rt arm clots  . Obstructive sleep apnea     continuous positive airway pressure not using at present  . Shortness of breath dyspnea     exersion  . CHF (congestive heart failure)     Scheduled Meds:  Scheduled Meds: . apixaban  5 mg Oral BID  . atorvastatin  80 mg Oral QHS  . furosemide  40 mg Oral BID  . insulin aspart  0-15 Units Subcutaneous TID WC  . insulin aspart  0-5 Units Subcutaneous QHS  . insulin glargine  20  Units Subcutaneous [id number]  . levothyroxine  75 mcg Oral QAC breakfast  . losartan  50 mg Oral QHS  . metoprolol  100 mg Oral BID  . mometasone-formoterol  2 puff Inhalation BID  . pantoprazole  40 mg Oral Daily  . potassium chloride  40 mEq Oral BID  . ranolazine  500 mg Oral BID  . sodium chloride  3 mL Intravenous Q12H  . sulfaSALAzine  1,000 mg Oral BID  . tiotropium  18 mcg Inhalation Daily   Continuous Infusions:  acetaminophen **OR** acetaminophen, albuterol, ondansetron **OR** ondansetron (ZOFRAN) IV    PHYSICAL EXAM Filed Vitals:   03/30/15 0631 03/30/15 0733 03/30/15 1002 03/30/15 1059  BP: 124/52  95/49 101/60  Pulse: 97  81 81  Temp: 98.4 F (36.9 C)     TempSrc: Oral     Resp: 18     Height:      Weight: 271 lb 4.8 oz (123.061 kg)     SpO2: 96% 95%      Well developed and Morbidly obesein no acute distress HENT normal Neck supple with JVP-flat Clear Regular rate and rhythm, no murmurs or gallops Abd-soft with active BS No Clubbing cyanosis 1-2+ edema Skin-warm and  dry A & Oriented  Grossly normal sensory and motor function   TELEMETRY: Reviewed telemetry pt in vpacing    Intake/Output Summary (Last 24 hours) at 03/30/15 1301 Last data filed at 03/30/15 1137  Gross per 24 hour  Intake    820 ml  Output   2950 ml  Net  -2130 ml    LABS: Basic Metabolic Panel:  Recent Labs Lab 03/28/15 1619 03/29/15 0420 03/30/15 0411  NA 136 136 137  K 4.2 3.5 4.1  CL 103 100* 100*  CO2 25 26 27   GLUCOSE 171* 105* 111*  BUN 18 16 16   CREATININE 1.16* 0.97 1.08*  CALCIUM 9.9 9.9 10.1   Cardiac Enzymes: No results for input(s): CKTOTAL, CKMB, CKMBINDEX, TROPONINI in the last 72 hours. CBC:  Recent Labs Lab 03/28/15 1619 03/29/15 0420  WBC 12.7* 9.0  NEUTROABS 10.5* 5.4  HGB 15.9* 14.0  HCT 46.4* 42.3  MCV 94.5 93.6  PLT 285 283   PROTIME:  Recent Labs  03/29/15 0030  LABPROT 17.0*  INR 1.37   Liver Function Tests:  Recent Labs   03/28/15 1619 03/29/15 0420  AST 25 22  ALT 20 16  ALKPHOS 81 70  BILITOT 1.2 1.3*  PROT 7.3 6.2*  ALBUMIN 3.5 2.9*   No results for input(s): LIPASE, AMYLASE in the last 72 hours. BNP: BNP (last 3 results)  Recent Labs  02/15/15 1129 03/28/15 1857  BNP 1515.5* 224.0*    ProBNP (last 3 results) No results for input(s): PROBNP in the last 8760 hours.  D-Dimer: No results for input(s): DDIMER in the last 72 hours. Hemoglobin A1C:  Recent Labs  03/29/15 0030  HGBA1C 7.0*      ASSESSMENT AND PLAN:  Principal Problem:   Near syncope Active Problems:   Atrial fibrillation   CARDIAC PACEMAKER IN SITU-MEDTRONIC ADAPTA L   Coronary artery disease   Diabetes mellitus   Body mass index (BMI) of 40.0-44.9 in adult   Chronic kidney disease, stage III (moderate)   Chronic obstructive pulmonary disease   Crohn's disease of large intestine without complication   Gastro-esophageal reflux disease without esophagitis   Essential (primary) hypertension   Hypothyroidism   Sleep apnea   Type 2 diabetes mellitus with diabetic nephropathy  i am not sure what gave rise to her hypoglycemia and hypotension but she is certainly much improved Will continue withdiureses and follow bloddcultures  Signed, Virl Axe MD  03/30/2015

## 2015-04-02 LAB — CULTURE, BLOOD (ROUTINE X 2)
CULTURE: NO GROWTH
Culture: NO GROWTH

## 2015-04-07 ENCOUNTER — Encounter: Payer: Self-pay | Admitting: Internal Medicine

## 2015-04-12 ENCOUNTER — Other Ambulatory Visit: Payer: Self-pay | Admitting: Internal Medicine

## 2015-04-14 ENCOUNTER — Institutional Professional Consult (permissible substitution): Payer: Self-pay | Admitting: Pulmonary Disease

## 2015-04-14 ENCOUNTER — Other Ambulatory Visit: Payer: Self-pay | Admitting: Internal Medicine

## 2015-04-17 ENCOUNTER — Other Ambulatory Visit: Payer: Self-pay | Admitting: Internal Medicine

## 2015-04-20 ENCOUNTER — Other Ambulatory Visit: Payer: Self-pay | Admitting: Internal Medicine

## 2015-04-25 ENCOUNTER — Ambulatory Visit (INDEPENDENT_AMBULATORY_CARE_PROVIDER_SITE_OTHER): Payer: Medicare Other | Admitting: Internal Medicine

## 2015-04-25 ENCOUNTER — Encounter: Payer: Self-pay | Admitting: Internal Medicine

## 2015-04-25 VITALS — BP 118/82 | HR 69 | Ht 71.0 in | Wt 275.0 lb

## 2015-04-25 DIAGNOSIS — J449 Chronic obstructive pulmonary disease, unspecified: Secondary | ICD-10-CM | POA: Diagnosis not present

## 2015-04-25 NOTE — Progress Notes (Signed)
Subjective:    Patient ID: [name], female    DOB: [birth year], 74 y.o.   MRN: [id number] PCP [name], MD  HPI   OV 04/25/2015  Chief Complaint  Patient presents with  . Follow-up    Former [facility] pt. Pt c/o DOE. Pt denies significant cough and CP/tightness.     Gold stage II COPD patient. Last pulmonary function test in 2013  Used to be followed by Dr. Danton Sewer in our office. Last office visit was in May 2016. She now follows up for routine COPD care. She presents with her son-in-law. She suffers also from obesity and coronary artery disease and heart failure not otherwise specified. She is mostly sedentary but does activities of daily living in several house. She needed to come  from the car to our office in a wheelchair due to her dyspnea and joint issues. Overall she reports stability in her COPD. There are no new issues. She has never attended pulmonary rehabilitation. After admission to the hospital in spring 2016 for heart failure exacerbation she was subjected inpatient rehabilitation and then home physical therapy. She is improved since then but there is still some deconditioning. She reluctantly agrees to go to pulmonary rehabilitation as an outpatient.     has a past medical history of Nonischemic cardiomyopathy; Chronic diastolic heart failure; Morbid obesity; Hyperlipidemia; Tachy-brady syndrome; Chronic obstructive pulmonary disease; Osteoporosis; Depression; GERD (gastroesophageal reflux disease); Crohn's disease; Hypothyroidism; Seasonal allergies; Pacemaker; Atrial fibrillation; CAD (coronary artery disease); Coronary artery disease (06/15/2012); Atrial fibrillation (12/18/2007); Morbid obesity with BMI of 40.0-44.9, adult (06/22/2012); Diabetes mellitus (06/22/2012); Atrial fibrillation; Diabetes mellitus without complication; COPD (chronic obstructive pulmonary disease); Gout; Dysrhythmia; Peripheral vascular disease (15); Obstructive sleep apnea; Shortness of breath  dyspnea; and CHF (congestive heart failure).   reports that she quit smoking about 28 years ago. She quit smokeless tobacco use about 27 years ago.  Past Surgical History  Procedure Laterality Date  . Pulmonary vein isolation  05/12/2008    RFCA atrial fibrillation  . Post ablation pseudoaneurysm      at A fib ablation  . Pacemaker insertion  2010    medtronic ADAPTA   . Tonsillectomy    . Embolectomy Right 08/16/2014    Procedure: EMBOLECTOMY- RIGHT BRACHIAL ARTERY;  Surgeon: Serafina Mitchell, MD;  Location: [facility] OR;  Service: Vascular;  Laterality: Right;  . Cardiac catheterization  08/2000    noncritical disease mid RCA, EF preserved  . Cardiac catheterization  05/29/2007    noncritical disease, EF 20-25%  . Eye surgery Right 16    cataracts  lft yrs ago  . Parathyroidectomy Right 01/30/2015    Procedure: PARATHYROIDECTOMY;  Surgeon: Armandina Gemma, MD;  Location: [facility];  Service: General;  Laterality: Right;    No Known Allergies  Immunization History  Administered Date(s) Administered  . Influenza-Unspecified 06/30/2014    Family History  Problem Relation Age of Onset  . Breast cancer Mother   . Heart disease Father   . Emphysema Father      Current outpatient prescriptions:  .  albuterol (PROVENTIL HFA;VENTOLIN HFA) 108 (90 BASE) MCG/ACT inhaler, Inhale 2 puffs into the lungs every 6 (six) hours as needed for wheezing or shortness of breath., Disp: , Rfl:  .  atorvastatin (LIPITOR) 80 MG tablet, Take 80 mg by mouth at bedtime.  , Disp: , Rfl:  .  ELIQUIS 5 MG TABS tablet, TAKE 1 TABLET BY MOUTH TWICE DAILY, Disp: 180 tablet, Rfl: 1 .  Fiber CAPS, Take 1 tablet by mouth at bedtime. , Disp: , Rfl:  .  Fluticasone-Salmeterol (ADVAIR) 250-50 MCG/DOSE AEPB, Inhale 1 puff into the lungs every 12 (twelve) hours., Disp: 60 each, Rfl: 6 .  furosemide (LASIX) 40 MG tablet, 1 tablet by mouth in the am & 2 tablets by mouth in the pm, Disp: , Rfl:  .  insulin glargine (LANTUS) 100  UNIT/ML injection, Inject 0.2 mLs (20 Units total) into the skin every morning., Disp: , Rfl:  .  ipratropium-albuterol (DUONEB) 0.5-2.5 (3) MG/3ML SOLN, Take 3 mLs by nebulization every 6 (six) hours as needed (wheezing and or shortness of breath)., Disp: 360 mL, Rfl: 0 .  KLOR-CON M20 20 MEQ tablet, TAKE 1 TABLET BY MOUTH TWICE DAILY, Disp: 180 tablet, Rfl: 3 .  levothyroxine (SYNTHROID, LEVOTHROID) 75 MCG tablet, Take 75 mcg by mouth daily., Disp: , Rfl:  .  loratadine (CLARITIN) 10 MG tablet, Take 10 mg by mouth as needed for allergies. , Disp: , Rfl:  .  losartan (COZAAR) 50 MG tablet, Take 50 mg by mouth at bedtime. , Disp: , Rfl:  .  metoprolol (LOPRESSOR) 100 MG tablet, TAKE 1 TABLET (100 MG TOTAL) BY MOUTH 2 (TWO) TIMES DAILY., Disp: 180 tablet, Rfl: 3 .  Multiple Vitamins-Minerals (MULTIVITAMIN WITH MINERALS) tablet, Take 1 tablet by mouth daily., Disp: , Rfl:  .  nitroGLYCERIN (NITROSTAT) 0.4 MG SL tablet, Place 1 tablet (0.4 mg total) under the tongue every 5 (five) minutes as needed for chest pain., Disp: 25 tablet, Rfl: 1 .  omeprazole (PRILOSEC OTC) 20 MG tablet, Take 20 mg by mouth daily.  , Disp: , Rfl:  .  RANEXA 500 MG 12 hr tablet, Take 1 tablet by mouth 2 (two) times daily., Disp: , Rfl:  .  sulfaSALAzine (AZULFIDINE) 500 MG tablet, Take 1,000 mg by mouth 2 (two) times daily., Disp: , Rfl: 11 .  Tiotropium Bromide Monohydrate (SPIRIVA RESPIMAT) 2.5 MCG/ACT AERS, Inhale 2 puffs into the lungs daily., Disp: 4 g, Rfl: 6 .  [DISCONTINUED] allopurinol (ZYLOPRIM) 100 MG tablet, Take 100 mg by mouth daily.  , Disp: , Rfl:     Review of Systems  Constitutional: Negative for fever and unexpected weight change.  HENT: Negative for congestion, dental problem, ear pain, nosebleeds, postnasal drip, rhinorrhea, sinus pressure, sneezing, sore throat and trouble swallowing.   Eyes: Negative for redness and itching.  Respiratory: Positive for shortness of breath. Negative for cough, chest  tightness and wheezing.   Cardiovascular: Negative for palpitations and leg swelling.  Gastrointestinal: Negative for nausea and vomiting.  Genitourinary: Negative for dysuria.  Musculoskeletal: Negative for joint swelling.  Skin: Negative for rash.  Neurological: Negative for headaches.  Hematological: Does not bruise/bleed easily.  Psychiatric/Behavioral: Negative for dysphoric mood. The patient is not nervous/anxious.        Objective:   Physical Exam  Constitutional: She is oriented to person, place, and time. She appears well-developed and well-nourished. No distress.  Obese Deconditioned looking  HENT:  Head: Normocephalic and atraumatic.  Right Ear: External ear normal.  Left Ear: External ear normal.  Mouth/Throat: Oropharynx is clear and moist. No oropharyngeal exudate.  Eyes: Conjunctivae and EOM are normal. Pupils are equal, round, and reactive to light. Right eye exhibits no discharge. Left eye exhibits no discharge. No scleral icterus.  Neck: Normal range of motion. Neck supple. No JVD present. No tracheal deviation present. No thyromegaly present.  Cardiovascular: Normal rate, regular rhythm, normal heart sounds and  intact distal pulses.  Exam reveals no gallop and no friction rub.   No murmur heard. Pulmonary/Chest: Effort normal and breath sounds normal. No respiratory distress. She has no wheezes. She has no rales. She exhibits no tenderness.  Abdominal: Soft. Bowel sounds are normal. She exhibits no distension and no mass. There is no tenderness. There is no rebound and no guarding.  Musculoskeletal: Normal range of motion. She exhibits edema. She exhibits no tenderness.  Sitting in wheel chair Mild chronic venous stasis edema present  Lymphadenopathy:    She has no cervical adenopathy.  Neurological: She is alert and oriented to person, place, and time. She has normal reflexes. No cranial nerve deficit. She exhibits normal muscle tone. Coordination normal.  Skin:  Skin is warm and dry. No rash noted. She is not diaphoretic. No erythema. No pallor.  Psychiatric: She has a normal mood and affect. Her behavior is normal. Judgment and thought content normal.  Vitals reviewed.   Filed Vitals:   04/25/15 1444  BP: 118/82  Pulse: 69  Height: 5\' 11"  (1.803 m)  Weight: 275 lb (124.739 kg)  SpO2: 93%        Assessment & Plan:     ICD-9-CM ICD-10-CM   1. COPD, moderate 496 J44.9 Pulmonary rehab therapeutic exercise     Pulmonary Function Test   Stable disease Continue Spiriva and Advair scheduled with DuoNeb as needed Refer pulmonary rehabilitation for moderate COPD  Follow-up - In 3 months do spirometry pre-and postbronchodilator with June Leap - Return to see me in 3 months after spirometry   Dr. Brand Males, M.D., [facility].C.P Pulmonary and Critical Care Medicine Staff Physician [facility] Pager: [phone], If no answer or between  15:00h - 7:00h: call [phone]  04/25/2015 3:15 PM

## 2015-04-25 NOTE — Patient Instructions (Signed)
ICD-9-CM ICD-10-CM   1. COPD, moderate 496 J44.9    Stable disease Continue Spiriva and Advair scheduled with DuoNeb as needed Refer pulmonary rehabilitation for moderate COPD  Follow-up - In 3 months do spirometry pre-and postbronchodilator with June Leap - Return to see me in 3 months after spirometry

## 2015-05-01 ENCOUNTER — Telehealth (HOSPITAL_COMMUNITY): Payer: Self-pay

## 2015-05-01 NOTE — Telephone Encounter (Signed)
I have called and left a message with [name] to inquire about participation in Pulmonary Rehab per Dr. Clance/Ramaswamy's referral. Will send letter in mail and follow up.

## 2015-05-08 ENCOUNTER — Telehealth (HOSPITAL_COMMUNITY): Payer: Self-pay

## 2015-05-08 NOTE — Telephone Encounter (Signed)
I have called and left a message with Pinky to inquire about participation in Pulmonary Rehab per Dr. Golden Pop referral. Will send letter in mail and follow up.

## 2015-05-09 ENCOUNTER — Telehealth (HOSPITAL_COMMUNITY): Payer: Self-pay | Admitting: *Deleted

## 2015-06-04 ENCOUNTER — Other Ambulatory Visit: Payer: Self-pay | Admitting: Internal Medicine

## 2015-06-06 ENCOUNTER — Encounter: Payer: Self-pay | Admitting: Pulmonary Disease

## 2015-06-06 ENCOUNTER — Ambulatory Visit (INDEPENDENT_AMBULATORY_CARE_PROVIDER_SITE_OTHER): Payer: Medicare Other | Admitting: Pulmonary Disease

## 2015-06-06 VITALS — BP 122/84 | HR 77 | Ht 71.0 in | Wt 274.0 lb

## 2015-06-06 DIAGNOSIS — G4733 Obstructive sleep apnea (adult) (pediatric): Secondary | ICD-10-CM | POA: Diagnosis not present

## 2015-06-06 NOTE — Assessment & Plan Note (Signed)
We had a discussion about cardiovascular morbidity of OSA. She does have diastolic heart failure and what appears to be severe pulmonary hypertension. She would certainly benefit from CPAP therapy. She does not report daytime somnolence-but she may be underreporting  we had a frank discussion about CPAP use and her prior experience with this. We will repeat a home sleep study. If she qualifies she will then need a CPAP titration study, this will help [place] to trouble shoot problems, desensitizer from a mask standpoint, before starting her on CPAP

## 2015-06-06 NOTE — Progress Notes (Signed)
Subjective:    Patient ID: [name], female    DOB: 1941-06-01, 74 y.o.   MRN: [id number]  HPI   Chief Complaint  Patient presents with  . Sleep Consult    Referred by Dr. Dorthy Cooler; Patient says that she gets up to go to the bathroom a lot, she has trouble breathing.  Had sleep Study done 12/16/07; used CPAP for a while, didn't notice any difference, stopped using the CPAP.  Epworth Score: 0   She has known moderate COPD- FEV1 55%, and was admitted to the hospital 11/2014  with  acute on chronic congestive heart failure. 03/2015 near syncope She sees Dr. Caryl Comes for chronic systolic heart failure, EF has recovered to 45-50% in 05/2014. She has a permanent pacemaker since 2001 for tachybradycardia syndrome. She has severe pulmonary hypertension , RVSP noted to be 73 in 01/2015 with severely dilated RA/RV PSG 11/2007 showed moderate OSA with AHI 19/hour, RDI 25/hour and lowest desaturation of 82%. She unfortunately did not tolerate the CPAP machine and stop using. She denies excessive daytime somnolence, Epworth sleepiness score is 0, but I suspect she is underreporting. She has always been a late to bed person,.evening college, now her bedtime is as late as 2-3  AM, sleep latency is about 30 minutes, reports 3-4 nocturnal awakenings including nocturia and is out of bed by 11 AM feeling tired with occasional dryness of mouth There is no history suggestive of cataplexy, sleep paralysis or parasomnias She is unable to provide a history of weight gain, but it does seem from chart review that she has gained about 20 pounds. She is in a wheelchair today due to an acute attack of gout, normally she ambulates with a cane She quit smoking in 87 and does not consume alcohol  Past Medical History  Diagnosis Date  . Nonischemic cardiomyopathy     history of,  EF 20-25% at left heart cath in 06/2007; echo 2011 had normal LV function  . Chronic diastolic heart failure     Echo 06/2010: Moderate LVH, EF  55-65%, normal wall motion, mild MR, moderate to severe LAE, mild RAE.   . Morbid obesity   . Hyperlipidemia   . Tachy-brady syndrome     status post implant of a medtronic dual-chamber pacemaker in 2001.  explanted in 2010 -- Brownlee pacemaker in 2010.  Marland Kitchen Chronic obstructive pulmonary disease   . Osteoporosis   . Depression   . GERD (gastroesophageal reflux disease)   . Crohn's disease   . Hypothyroidism     treated  . Seasonal allergies   . Pacemaker     Medtronic  . Atrial fibrillation     Status post pulmonary vein isolation 2009 at [facility]  . CAD (coronary artery disease)     LHC 05/2007: pLAD 70-80%, pRCA 40%, EF 20-25%. LAD lesion treated medically.   . Coronary artery disease 06/15/2012  . Atrial fibrillation 12/18/2007    Annotation: refractory Qualifier: Diagnosis of  By: Doy Mince LPN, Megan    . Morbid obesity with BMI of 40.0-44.9, adult 06/22/2012  . Diabetes mellitus 06/22/2012  . Atrial fibrillation   . Diabetes mellitus without complication   . COPD (chronic obstructive pulmonary disease)   . Gout   . Dysrhythmia     atrial fib  . Peripheral vascular disease 15    rt arm clots  . Obstructive sleep apnea     continuous positive airway pressure not using at present  .  Shortness of breath dyspnea     exersion  . CHF (congestive heart failure)      Review of Systems neg for any significant sore throat, dysphagia, itching, sneezing, nasal congestion or excess/ purulent secretions, fever, chills, sweats, unintended wt loss, pleuritic or exertional cp, hempoptysis, orthopnea pnd or change in chronic leg swelling. Also denies presyncope, palpitations, heartburn, abdominal pain, nausea, vomiting, diarrhea or change in bowel or urinary habits, dysuria,hematuria, rash, arthralgias, visual complaints, headache, numbness weakness or ataxia.     Objective:   Physical Exam  Gen. Pleasant, obese, in no distress ENT - no lesions, no post nasal  drip Neck: No JVD, no thyromegaly, no carotid bruits Lungs: no use of accessory muscles, no dullness to percussion, decreased without rales or rhonchi  Cardiovascular: Rhythm regular, heart sounds  normal, no murmurs or gallops, no peripheral edema Musculoskeletal: No deformities, no cyanosis or clubbing , no tremors       Assessment & Plan:

## 2015-06-06 NOTE — Progress Notes (Signed)
   Subjective:    Patient ID: GRETEL CANTU, female    DOB: July 18, 1941, 74 y.o.   MRN: [id number]  HPI    Review of Systems  Constitutional: Negative for fever, chills and unexpected weight change.  HENT: Negative for congestion, dental problem, ear pain, nosebleeds, postnasal drip, rhinorrhea, sinus pressure, sneezing, sore throat, trouble swallowing and voice change.   Eyes: Negative for visual disturbance.  Respiratory: Positive for shortness of breath. Negative for cough and choking.   Cardiovascular: Negative for chest pain and leg swelling.  Gastrointestinal: Negative for vomiting, abdominal pain and diarrhea.  Genitourinary: Negative for difficulty urinating.  Musculoskeletal: Negative for arthralgias.  Skin: Negative for rash.  Neurological: Negative for tremors, syncope and headaches.  Hematological: Does not bruise/bleed easily.       Objective:   Physical Exam        Assessment & Plan:

## 2015-06-06 NOTE — Patient Instructions (Signed)
Home sleep study Based on this, we will arrange for a CPAP titration study

## 2015-06-14 ENCOUNTER — Telehealth: Payer: Self-pay | Admitting: Pulmonary Disease

## 2015-06-14 NOTE — Telephone Encounter (Signed)
HST ordered. PCC's did ya'll call pt?

## 2015-06-15 NOTE — Telephone Encounter (Signed)
I had called the patient on 09/13 to schedule HST. I just tried calling her back and had to LVM for her to call the office again to schedule HST. Waiting for her return call

## 2015-06-16 NOTE — Telephone Encounter (Signed)
No to my knowledge she has not returned my phone call from yesterday

## 2015-06-16 NOTE — Telephone Encounter (Signed)
[name], have you been able to reach this patient? Please advise.

## 2015-06-19 ENCOUNTER — Encounter: Payer: Self-pay | Admitting: Internal Medicine

## 2015-06-19 NOTE — Telephone Encounter (Signed)
Forwarding to [place] and PCC's to follow up on as pt returned call to our office to schedule HST.  See below message.  Thanks!

## 2015-06-19 NOTE — Telephone Encounter (Signed)
I just tried calling the patient again and left a detailed message about calling my # at my desk to schedule the HST

## 2015-06-21 NOTE — Telephone Encounter (Signed)
Ms. Assata Juncaj finally called me back and she is scheduled to come in on 06/26/2015 to pick up the Home Sleep Test

## 2015-06-27 DIAGNOSIS — G4733 Obstructive sleep apnea (adult) (pediatric): Secondary | ICD-10-CM

## 2015-06-30 ENCOUNTER — Telehealth: Payer: Self-pay | Admitting: Pulmonary Disease

## 2015-06-30 DIAGNOSIS — G4733 Obstructive sleep apnea (adult) (pediatric): Secondary | ICD-10-CM

## 2015-06-30 NOTE — Telephone Encounter (Signed)
Mild OSA - 7/hr Does not need CPAP DeSat - may benefit from 02 during sleep -check ONO if agreeable

## 2015-07-03 NOTE — Telephone Encounter (Signed)
lmtcb

## 2015-07-04 NOTE — Telephone Encounter (Signed)
lmtcb

## 2015-07-05 ENCOUNTER — Ambulatory Visit (INDEPENDENT_AMBULATORY_CARE_PROVIDER_SITE_OTHER): Payer: Medicare Other | Admitting: Internal Medicine

## 2015-07-05 ENCOUNTER — Encounter: Payer: Self-pay | Admitting: Internal Medicine

## 2015-07-05 VITALS — BP 104/60 | HR 84 | Ht 70.0 in | Wt 273.1 lb

## 2015-07-05 DIAGNOSIS — Z950 Presence of cardiac pacemaker: Secondary | ICD-10-CM | POA: Diagnosis not present

## 2015-07-05 DIAGNOSIS — M791 Myalgia, unspecified site: Secondary | ICD-10-CM

## 2015-07-05 DIAGNOSIS — I480 Paroxysmal atrial fibrillation: Secondary | ICD-10-CM | POA: Diagnosis not present

## 2015-07-05 NOTE — Progress Notes (Signed)
Patient Care Team: Dibas Koirala, MD as PCP - General (Family Medicine) Dibas Koirala, MD (Family Medicine) Dibas Koirala, MD (Family Medicine)   HPI  [name] is a 74 y.o. female seen in followup for pacemaker implanted for tachybradycardia syndrome as well as permanent atrial fibrillation with a somewhat rapid ventricular response.    she is status post Pulm Vein isolation at [place] [facility] 2009.   She also has hypertension, obesity as well as sleep apnea   9/13 Right and left heart catheterization demonstrating stable stenosis of the proximal LAD and interval worsening of a diagonal branch.;  it was elected because of the complex anatomy to pursue medical therapy.   She has had variable assessment of LV function.  Echo  9/13 EF 30-35%. Global 4 chamber hypokinesis last month 9/15 EF 45-50%   LAD (59/2.3)  She is on anticoagulation    Struggled with exercise intolerance. It was thought potentially to be an ischemic equivalent setting of non-revascularizable coronary disease and ranolazine was attempted. This was ineffective and a strategy of augmented rate control was pursued which has been complicated by concerns of pulmonary regarding beta blockers   When she was seen in the office last she came in with fevers and chills and was sent to the hospital>> resloved without diagnosis  Complaints of severe pain in prox thighs, but not shoulder     Past Medical History  Diagnosis Date  . Nonischemic cardiomyopathy ([place])     history of,  EF 20-25% at left heart cath in 06/2007; echo 2011 had normal LV function  . Chronic diastolic heart failure ([place])     Echo 06/2010: Moderate LVH, EF 55-65%, normal wall motion, mild MR, moderate to severe LAE, mild RAE.   . Morbid obesity ([place])   . Hyperlipidemia   . Tachy-brady syndrome ([place])     status post implant of a medtronic dual-chamber pacemaker in 2001.  explanted in 2010 -- Haivana Nakya pacemaker in  2010.  Marland Kitchen Chronic obstructive pulmonary disease ([place])   . Osteoporosis   . Depression   . GERD (gastroesophageal reflux disease)   . Crohn's disease ([place])   . Hypothyroidism     treated  . Seasonal allergies   . Pacemaker     Medtronic  . Atrial fibrillation ([place])     Status post pulmonary vein isolation 2009 at [facility]  . CAD (coronary artery disease)     LHC 05/2007: pLAD 70-80%, pRCA 40%, EF 20-25%. LAD lesion treated medically.   . Coronary artery disease 06/15/2012  . Atrial fibrillation ([place]) 12/18/2007    Annotation: refractory Qualifier: Diagnosis of  By: Doy Mince LPN, Megan    . Morbid obesity with BMI of 40.0-44.9, adult ([place]) 06/22/2012  . Diabetes mellitus ([place]) 06/22/2012  . Atrial fibrillation ([place])   . Diabetes mellitus without complication ([place])   . COPD (chronic obstructive pulmonary disease) ([place])   . Gout   . Dysrhythmia     atrial fib  . Peripheral vascular disease (HCC) 15    rt arm clots  . Obstructive sleep apnea     continuous positive airway pressure not using at present  . Shortness of breath dyspnea     exersion  . CHF (congestive heart failure) [facility])     Past Surgical History  Procedure Laterality Date  . Pulmonary vein isolation  05/12/2008    RFCA atrial fibrillation  . Post ablation pseudoaneurysm  at A fib ablation  . Pacemaker insertion  2010    medtronic ADAPTA   . Tonsillectomy    . Embolectomy Right 08/16/2014    Procedure: EMBOLECTOMY- RIGHT BRACHIAL ARTERY;  Surgeon: Serafina Mitchell, MD;  Location: [facility] OR;  Service: Vascular;  Laterality: Right;  . Cardiac catheterization  08/2000    noncritical disease mid RCA, EF preserved  . Cardiac catheterization  05/29/2007    noncritical disease, EF 20-25%  . Eye surgery Right 16    cataracts  lft yrs ago  . Parathyroidectomy Right 01/30/2015    Procedure: PARATHYROIDECTOMY;  Surgeon: Armandina Gemma, MD;  Location: [place];  Service: General;  Laterality: Right;    Current Outpatient  Prescriptions  Medication Sig Dispense Refill  . albuterol (PROVENTIL HFA;VENTOLIN HFA) 108 (90 BASE) MCG/ACT inhaler Inhale 2 puffs into the lungs every 6 (six) hours as needed for wheezing or shortness of breath.    Marland Kitchen atorvastatin (LIPITOR) 80 MG tablet Take 80 mg by mouth at bedtime.      Marland Kitchen ELIQUIS 5 MG TABS tablet TAKE 1 TABLET BY MOUTH TWICE DAILY 180 tablet 1  . Fiber CAPS Take 1 tablet by mouth at bedtime.     . Fluticasone-Salmeterol (ADVAIR) 250-50 MCG/DOSE AEPB Inhale 1 puff into the lungs every 12 (twelve) hours. 60 each 6  . folic acid (FOLVITE) 1 MG tablet Take 1 mg by mouth daily.  12  . furosemide (LASIX) 40 MG tablet 1 tablet by mouth in the am & 2 tablets by mouth in the pm    . insulin glargine (LANTUS) 100 UNIT/ML injection Inject 0.2 mLs (20 Units total) into the skin every morning.    Marland Kitchen ipratropium-albuterol (DUONEB) 0.5-2.5 (3) MG/3ML SOLN Take 3 mLs by nebulization every 6 (six) hours as needed (wheezing and or shortness of breath). 360 mL 0  . KLOR-CON M20 20 MEQ tablet TAKE 1 TABLET BY MOUTH TWICE DAILY 180 tablet 3  . levothyroxine (SYNTHROID, LEVOTHROID) 75 MCG tablet Take 75 mcg by mouth daily.    Marland Kitchen loratadine (CLARITIN) 10 MG tablet Take 10 mg by mouth as needed for allergies.     Marland Kitchen losartan (COZAAR) 50 MG tablet Take 50 mg by mouth at bedtime.     . metoprolol (LOPRESSOR) 100 MG tablet TAKE 1 TABLET (100 MG TOTAL) BY MOUTH 2 (TWO) TIMES DAILY. 180 tablet 3  . Multiple Vitamins-Minerals (MULTIVITAMIN WITH MINERALS) tablet Take 1 tablet by mouth daily.    . nitroGLYCERIN (NITROSTAT) 0.4 MG SL tablet Place 1 tablet (0.4 mg total) under the tongue every 5 (five) minutes as needed for chest pain. 25 tablet 1  . omeprazole (PRILOSEC OTC) 20 MG tablet Take 20 mg by mouth daily.      Marland Kitchen RANEXA 500 MG 12 hr tablet Take 1 tablet by mouth 2 (two) times daily.    Marland Kitchen sulfaSALAzine (AZULFIDINE) 500 MG tablet Take 1,000 mg by mouth 2 (two) times daily.  11  . Tiotropium Bromide  Monohydrate (SPIRIVA RESPIMAT) 2.5 MCG/ACT AERS Inhale 2 puffs into the lungs daily. 4 g 6  . [DISCONTINUED] allopurinol (ZYLOPRIM) 100 MG tablet Take 100 mg by mouth daily.       No current facility-administered medications for this visit.    No Known Allergies  Review of Systems negative except from HPI and PMH  Physical Exam BP 104/60 mmHg  Pulse 84  Ht _0  (1.778 m)  Wt 235 lb 9.6 oz (106.867 kg)  BMI 33.80 kg/m2  Well developed and well nourished in no acute distress HENT normal E scleral and icterus clear Neck Supple JVP flat; carotids brisk and full Clear to ausculation Irregular rate and rhythm, no murmurs gallops or rub Soft with active bowel sounds No clubbing cyanosis Trace Edema Alert and oriented, grossly normal motor and sensory function Skin Warm and Dry  ECG demonstrates atrial fibrillation rate of 85 Intervals/10/40  with right axis deviation  Ambulatory oxygen saturation testing today in the office demonstrated a saturation of 93%   Assessment and  Plan  HFpEF/HFrEF  Atrial fibrillation-permanent  Pacemaker The patient's device was interrogated.  The information was reviewed. No changes were made in the programming.    Myalgias   CAD  She is euvolemic   We discussed using her furosemide 80 mg daily and a second dose prn  Her myalgias are concerning in the context of a prev ESR of 55 albeit during her acute illness in 7/16  We will recheck and hs-CRP for PMR

## 2015-07-05 NOTE — Patient Instructions (Signed)
Medication Instructions: - no changes  Labwork: - Your physician recommends that you have lab work today: Sed rate/ CRP  Procedures/Testing: - none  Follow-Up: - Your physician wants you to follow-up in: 6 months with Dr. Caryl Comes. You will receive a reminder letter in the mail two months in advance. If you don't receive a letter, please call our office to schedule the follow-up appointment.  Any Additional Special Instructions Will Be Listed Below (If Applicable). - none

## 2015-07-06 LAB — CUP PACEART INCLINIC DEVICE CHECK: MDC IDC SESS DTM: 20161006093027

## 2015-07-06 LAB — SEDIMENTATION RATE: SED RATE: 12 mm/h (ref 0–30)

## 2015-07-06 NOTE — Telephone Encounter (Signed)
lmtcb

## 2015-07-07 ENCOUNTER — Other Ambulatory Visit: Payer: Self-pay | Admitting: *Deleted

## 2015-07-07 DIAGNOSIS — G4733 Obstructive sleep apnea (adult) (pediatric): Secondary | ICD-10-CM

## 2015-07-07 LAB — HIGH SENSITIVITY CRP: CRP HIGH SENSITIVITY: 1.5 mg/L

## 2015-07-07 NOTE — Telephone Encounter (Signed)
lmtcb

## 2015-07-07 NOTE — Telephone Encounter (Signed)
Pt is aware of sleep study results. Wants to proceed with ONO. This has been ordered. Nothing further was needed.

## 2015-07-07 NOTE — Telephone Encounter (Signed)
Pt returning call.[name] ° °

## 2015-07-18 ENCOUNTER — Encounter: Payer: Self-pay | Admitting: Internal Medicine

## 2015-07-24 ENCOUNTER — Telehealth: Payer: Self-pay | Admitting: Pulmonary Disease

## 2015-07-24 ENCOUNTER — Other Ambulatory Visit: Payer: Self-pay | Admitting: Internal Medicine

## 2015-07-24 DIAGNOSIS — G4733 Obstructive sleep apnea (adult) (pediatric): Secondary | ICD-10-CM

## 2015-07-24 NOTE — Telephone Encounter (Addendum)
ONO results Per RA: Start O2 - 2L; Repeat ONO on 2L

## 2015-07-24 NOTE — Telephone Encounter (Signed)
Patient called in requesting an emergency refill for 1 month of Eliquis.  She has lost her 90 day supply just filled.  I sent to pharmacy, she is aware thatt is may be out of pocket to pay for it.

## 2015-07-25 NOTE — Telephone Encounter (Signed)
Order entered for O2 and for ONO on O2. Left message for patient to call back.

## 2015-07-26 NOTE — Telephone Encounter (Signed)
lmomtcb x2 for pt 

## 2015-07-28 ENCOUNTER — Ambulatory Visit: Payer: Self-pay | Admitting: Surgery

## 2015-07-28 NOTE — Telephone Encounter (Signed)
lmomtcb x 3 for pt 

## 2015-07-31 NOTE — Telephone Encounter (Signed)
Pt is aware of results. States that she was already set up with this oxygen. Nothing further was needed.

## 2015-07-31 NOTE — Telephone Encounter (Signed)
lmomtcb for pt 

## 2015-07-31 NOTE — Telephone Encounter (Signed)
Patient returned call, [phone].

## 2015-07-31 NOTE — Telephone Encounter (Signed)
lmtcb for pt.  

## 2015-07-31 NOTE — Telephone Encounter (Signed)
Return call per ptcan be reached @ same#.[name]

## 2015-08-01 ENCOUNTER — Telehealth: Payer: Self-pay | Admitting: Internal Medicine

## 2015-08-01 NOTE — Telephone Encounter (Signed)
New message     Request for surgical clearance:  What type of surgery is being performed?  Parathyroid surgery 1. When is this surgery scheduled?  Pending clearance  Are there any medications that need to be held prior to surgery and how long? Hold eliquis and for how long and need cardiac clearance 2. Name of physician performing surgery?  Dr Harlow Asa  3. What is your office phone and fax number?  Fax [phone] 4. They asked pt to hold eliquis for 5 days, she stated she could not do that because she may get a clot. So , what does Dr Caryl Comes say about holding medication?

## 2015-08-03 NOTE — Telephone Encounter (Signed)
Form faxed back to Mammie Lorenzo , LPN at [phone]. Confirmation received. Per Dr. Caryl Comes- "ok for surgery" " hold apixaban 48 hours prior- may receive last dose 2 nights prior to the day of surgery."

## 2015-08-10 ENCOUNTER — Encounter: Payer: Self-pay | Admitting: Pulmonary Disease

## 2015-08-19 ENCOUNTER — Other Ambulatory Visit: Payer: Self-pay | Admitting: Internal Medicine

## 2015-09-05 ENCOUNTER — Telehealth: Payer: Self-pay | Admitting: Pulmonary Disease

## 2015-09-05 NOTE — Telephone Encounter (Signed)
ONO results from 08/30/15 on 2L  Per RA: No DeSat

## 2015-09-06 NOTE — Telephone Encounter (Signed)
Left message for patient to call back  

## 2015-09-08 NOTE — Telephone Encounter (Signed)
Left message for pt to call back  °

## 2015-09-11 NOTE — Telephone Encounter (Signed)
LMTCB x 3 

## 2015-09-12 ENCOUNTER — Encounter: Payer: Self-pay | Admitting: Pulmonary Disease

## 2015-09-12 NOTE — Telephone Encounter (Signed)
Spoke with pt, aware of results/recs.  Nothing further needed.  

## 2015-09-15 ENCOUNTER — Other Ambulatory Visit: Payer: Self-pay | Admitting: Internal Medicine

## 2015-09-27 ENCOUNTER — Encounter (HOSPITAL_COMMUNITY)
Admission: RE | Admit: 2015-09-27 | Discharge: 2015-09-27 | Disposition: A | Discharge status: Home / Self Care | Payer: Medicare Other | Source: Ambulatory Visit | Attending: Surgery | Admitting: Surgery

## 2015-09-27 ENCOUNTER — Encounter (HOSPITAL_COMMUNITY): Payer: Self-pay

## 2015-09-27 DIAGNOSIS — E039 Hypothyroidism, unspecified: Secondary | ICD-10-CM | POA: Insufficient documentation

## 2015-09-27 DIAGNOSIS — Z01818 Encounter for other preprocedural examination: Secondary | ICD-10-CM | POA: Insufficient documentation

## 2015-09-27 DIAGNOSIS — Z01812 Encounter for preprocedural laboratory examination: Secondary | ICD-10-CM | POA: Diagnosis not present

## 2015-09-27 DIAGNOSIS — K5090 Crohn's disease, unspecified, without complications: Secondary | ICD-10-CM | POA: Diagnosis not present

## 2015-09-27 DIAGNOSIS — I5032 Chronic diastolic (congestive) heart failure: Secondary | ICD-10-CM | POA: Insufficient documentation

## 2015-09-27 DIAGNOSIS — E119 Type 2 diabetes mellitus without complications: Secondary | ICD-10-CM | POA: Insufficient documentation

## 2015-09-27 DIAGNOSIS — Z87891 Personal history of nicotine dependence: Secondary | ICD-10-CM | POA: Insufficient documentation

## 2015-09-27 DIAGNOSIS — K219 Gastro-esophageal reflux disease without esophagitis: Secondary | ICD-10-CM | POA: Insufficient documentation

## 2015-09-27 DIAGNOSIS — E210 Primary hyperparathyroidism: Secondary | ICD-10-CM | POA: Diagnosis not present

## 2015-09-27 DIAGNOSIS — Z950 Presence of cardiac pacemaker: Secondary | ICD-10-CM | POA: Insufficient documentation

## 2015-09-27 DIAGNOSIS — Z79899 Other long term (current) drug therapy: Secondary | ICD-10-CM | POA: Diagnosis not present

## 2015-09-27 DIAGNOSIS — I2510 Atherosclerotic heart disease of native coronary artery without angina pectoris: Secondary | ICD-10-CM | POA: Insufficient documentation

## 2015-09-27 DIAGNOSIS — G4733 Obstructive sleep apnea (adult) (pediatric): Secondary | ICD-10-CM | POA: Insufficient documentation

## 2015-09-27 DIAGNOSIS — I428 Other cardiomyopathies: Secondary | ICD-10-CM | POA: Insufficient documentation

## 2015-09-27 DIAGNOSIS — E669 Obesity, unspecified: Secondary | ICD-10-CM | POA: Diagnosis not present

## 2015-09-27 DIAGNOSIS — Z6838 Body mass index (BMI) 38.0-38.9, adult: Secondary | ICD-10-CM | POA: Insufficient documentation

## 2015-09-27 DIAGNOSIS — Z794 Long term (current) use of insulin: Secondary | ICD-10-CM | POA: Diagnosis not present

## 2015-09-27 DIAGNOSIS — Z7902 Long term (current) use of antithrombotics/antiplatelets: Secondary | ICD-10-CM | POA: Diagnosis not present

## 2015-09-27 DIAGNOSIS — J449 Chronic obstructive pulmonary disease, unspecified: Secondary | ICD-10-CM | POA: Insufficient documentation

## 2015-09-27 LAB — CBC
HCT: 46.1 % — ABNORMAL HIGH (ref 36.0–46.0)
Hemoglobin: 15 g/dL (ref 12.0–15.0)
MCH: 32.9 pg (ref 26.0–34.0)
MCHC: 32.5 g/dL (ref 30.0–36.0)
MCV: 101.1 fL — AB (ref 78.0–100.0)
PLATELETS: 259 10*3/uL (ref 150–400)
RBC: 4.56 MIL/uL (ref 3.87–5.11)
RDW: 14.7 % (ref 11.5–15.5)
WBC: 8.2 10*3/uL (ref 4.0–10.5)

## 2015-09-27 LAB — BASIC METABOLIC PANEL
ANION GAP: 9 (ref 5–15)
BUN: 23 mg/dL — ABNORMAL HIGH (ref 6–20)
CO2: 28 mmol/L (ref 22–32)
Calcium: 10.3 mg/dL (ref 8.9–10.3)
Chloride: 102 mmol/L (ref 101–111)
Creatinine, Ser: 1.36 mg/dL — ABNORMAL HIGH (ref 0.44–1.00)
GFR calc Af Amer: 43 mL/min — ABNORMAL LOW (ref >60)
GFR, EST NON AFRICAN AMERICAN: 37 mL/min — AB (ref >60)
GLUCOSE: 147 mg/dL — AB (ref 65–99)
POTASSIUM: 4.3 mmol/L (ref 3.5–5.1)
Sodium: 139 mmol/L (ref 135–145)

## 2015-09-27 LAB — PROTIME-INR
INR: 1.26 (ref 0.00–1.49)
PROTHROMBIN TIME: 15.9 s — AB (ref 11.6–15.2)

## 2015-09-27 LAB — GLUCOSE, CAPILLARY: Glucose-Capillary: 144 mg/dL — ABNORMAL HIGH (ref 65–99)

## 2015-09-27 NOTE — Progress Notes (Addendum)
PCP  Dr. Alla German @ [place]    lov [id number]/12016 Cardio  Dr. Lowella Fairy 03/2015  Someone @ [facility] put in the pacer 2001. Pulm  Dr. Is Dr. Elsworth Soho   Wears 2 L/[place] oxygen at bedtime.

## 2015-09-27 NOTE — Pre-Procedure Instructions (Signed)
[name]  09/27/2015      [id number] - [place] - [address] AT [address] [place] [address] Phone: [phone] Fax: [phone]    Your procedure is scheduled on Tuesday, January 3rd.   Report to [facility] Admitting at 11:30 AM             ( Tentative Surgery time is 1:30 PM - 3:00 PM)  Call this number if you have problems the morning of surgery:  [phone]   Remember:  Do not eat food or drink liquids after midnight Monday.   Take these medicines the morning of surgery with A SIP OF WATER : Synthroid, metoprolol, Omeprazole.             Please use your inhalers.Marland KitchenMarland KitchenAdvair, Duoneb, Albuterol the morning of surgery.                                      How to Manage Your Diabetes Before Surgery   Why is it important to control my blood sugar before and after surgery?   Improving blood sugar levels before and after surgery helps healing and can limit problems.  A way of improving blood sugar control is eating a healthy diet by:  - Eating less sugar and carbohydrates  - Increasing activity/exercise  - Talk with your doctor about reaching your blood sugar goals  High blood sugars (greater than 180 mg/dL) can raise your risk of infections and slow down your recovery so you will need to focus on controlling your diabetes during the weeks before surgery.  Make sure that the doctor who takes care of your diabetes knows about your planned surgery including the date and location.  How do I manage my blood sugars before surgery?   Check your blood sugar at least 4 times a day, 2 days before surgery to make sure that they are not too high or low.   Check your blood sugar the morning of your surgery when you wake up and every 2  hours until you get to the Short-Stay unit.    If your blood sugar is less than 70 mg/dL, you will need to treat for low blood sugar by:  Treat a low  blood sugar (less than 70 mg/dL) with 1/2 cup of clear juice (cranberry or apple), 4 glucose tablets, OR glucose gel.    Recheck blood sugar in 15 minutes after treatment (to make sure it is greater than 70 mg/dL).  If blood sugar is not greater than 70 mg/dL on re-check, call [phone] for further instructions.   Report your blood sugar to the Short-Stay nurse when you get to Short-Stay.  References:  [facility], 2007 "How to Manage your Diabetes Before and After Surgery".  What do I do about my diabetes medications?   Do not take oral diabetes medicines (pills) the morning of surgery.    THE NIGHT BEFORE SURGERY, take 0 units of  Insulin.        THE MORNING OF SURGERY, take 10 units of Lantus Insulin.    Do not take other diabetes injectables the day of surgery including Byetta, Victoza, Bydureon, and Trulicity.    If your CBG is greater than 220 mg/dL, you may take 1/2 of your sliding scale (correction) dose of insulin.   Do not wear jewelry, make-up or  nail polish.  Do not wear lotions, powders, or perfumes.  You may NOT wear deodorant the day of surgery.  Do not shave underarms & legs 48 hours prior to surgery.     Do not bring valuables to the hospital.  [facility] is not responsible for any belongings or valuables.  Contacts, dentures or bridgework may not be worn into surgery.  Leave your suitcase in the car.  After surgery it may be brought to your room. For patients admitted to the hospital, discharge time will be determined by your treatment team.   Name and phone number of your driver:     Please read over the following fact sheets that you were given. Pain Booklet, Coughing and Deep Breathing and Surgical Site Infection Prevention

## 2015-09-27 NOTE — Progress Notes (Signed)
Anesthesia PAT Evaluation: Patient is a 74 year old female scheduled for left parathyroid exploration and parathyroidectomy on 10/03/15 by Dr. Harlow Asa. Diagnosis: Primary hyperparathyroidism. She is s/p right parathyroidectomy on 01/30/15.   PMH includes nonischemic cardiomyopathy '08, CAD '13 (medical therapy due to complex anatomy), chronic diastolic HF, tachy-brady syndrome s/p dual chamber Medtronic Adapta PPM (initial PPM 09/15/00 with new generator implantation 03/13/09), atrial fibrillation s/p pulmonary vein isolation '09 [facility]), pulmonary hypertension, DM2, PVD, COPD Gold stage II, mild OSA (no CPAP; started on 2L/[place] 07/2015 for nocturnal desats), hypothyroidism, GERD, Crohn's disease, s/p right brachial artery embolectomy 08/16/2014, former smoker, obesity (BMI 38.96). She was admitted 5/18-5/21/16 for acute combined CHF. She was admitted 6/28-6/30/16 for near syncope thought to be secondary to hypoglycemia.   PCP is Dr. [name].  Pulmonologists are with [facility]. Sleep consult with Dr. Elsworth Soho on 06/06/15. Last saw Dr. Chase Caller 05/07/15.  Cardiologist is Dr. Caryl Comes, last office visit 07/05/15. On 08/03/15 notation by Mikle Bosworth, RN, "Per Dr. Caryl Comes- 'ok for surgery' 'hold apixaban 48 hours prior- may receive last dose 2 nights prior to the day of surgery.'"  Medications include albuterol, Lipitor, Advair, Folvite, Lasix, Lantus, Duoneb, Klor-Con, levothyroxine, loratadine, losartan, Eliquis, metoprolol, Nitro, Ranexa, sulfasalazine, Spiriva, Zanaflex, Prilosec.   PAT Vitals: BP 103/55, HR 77, T 36.7C, RR 20, O2 sat 92%. CBG 144.  Heart sounds fairly regular to me. No definite murmur heard, but heart sounds somewhat distant. Left sided PPM. Lungs clear, but diminished. Trace pedal edema. Patient sitting in hospital wheelchair at PAT. No acute distress. No conversational dyspnea. Denied chest pain. Reports SOB progressive over the years, but not significantly changed since her last surgery 01/2015.  Not weighing herself daily, but has actually lost a few pounds over the past few months. She sleeps in an adjustable bed with her HOB elevated (< 45 degrees) for comfort from hip pain. She ambulates at home with a walker.  07/05/15 EKG: Electronic ventricular pacemaker. Per Dr. Caryl Comes: AFib rate of 85, intervals /10/40 with right axis deviation.   02/16/15 Echo: Study Conclusions - Left ventricle: Poor acoustic windows limit study Overall LVEF appears normal to mildly depressed. The cavity size was normal. Wall thickness was increased in a pattern of moderate LVH. - Mitral valve: There was mild regurgitation. - Left atrium: The atrium was moderately dilated. - Right ventricle: The cavity size was severely dilated. Systolic function was moderately to severely reduced. - Right atrium: The atrium was severely dilated. - Pulmonary arteries: PA peak pressure: 73 mm Hg (S). (In comparison, 06/20/14 showed LVEF [id number], RV systolic function mildly reduced, PA peak pressure 50 mmHg.)  06/15/12 RHC/LHC (Dr. Sherren Mocha):  Hemodynamics RA mean of 5 RV 42/12 PA 38/15 with a mean of 27 PCWP mean of 10 LV 122/17 AO 123/63 with a mean of 88 Oxygen saturations: PA 63 AO 94 Cardiac Output (Fick) 4.6  Cardiac Index (Fick) 1.9 Final Conclusions:  1. Moderately severe stenosis of the proximal LAD (70-75% which is grossly unchanged from the 2008 study) with severe stenosis of the second diagonal (95% and heavily calcified, markedly progressed since 2008 study) 2. Nonobstructive stenosis of the left circumflex (minor non-obstructive disease) and right coronary artery (mild diffuse disease) 3. Normal LV function, 55% Recommendations: Will need to consider revascularization options. In my opinion, her anatomy presents challenges for PCI because of the LAD diagonal bifurcation, but also was not optimal for coronary bypass because of small and diseased target vessels. In addition, the patient has  long-standing atrial fibrillation and requires chronic anticoagulation. Additional surgical risk is related to her morbid obesity with BMI greater than 40. I will review her films with colleagues and likely will send her for an outpatient cardiac surgery evaluation. CT surgery Recommendations (Dr. Roxy Manns): We all agree that based upon the appearance of catheterization and the patient's associated comorbid medical problems the risks associated with coronary artery bypass grafting probably outweigh any potential benefits. I doubt that coronary artery bypass grafting would likely improve the patient's symptoms of exertional shortness of breath and fatigue. I don't feel that coronary artery bypass grafting would come with any potential added long-term survival benefit. He only potential benefits of coronary artery bypass grafting is probably slightly decreased risk of future myocardial infarction, but even that would be modest given the small size of the terminal branches of the left anterior descending coronary system. Cardiology follow-up 07/14/12 Dr. Burt Knack: I have reviewed the patient's cardiac catheterization films again. I had along discussion with the patient about potential management options. She is not a candidate for cardiac surgery. Her coronary disease is essentially limited to the LAD distribution and both the LAD and diagonal branches are small, especially in the distal segments. She would require complex bifurcation PCI and this is certainly not ideal in a patient on chronic warfarin for anticoagulation. We will continue with medical management for now. If she develops anginal symptoms, I would be happy to see her back to consider PCI. She will continue with her regular followup as scheduled with Dr. Caryl Comes.  07/19/15 Sleep Study: Mild OSA - 7/hr Does not need CPAP DeSat - may benefit from 02 during sleep  08/30/15 Overnight oximetry showed no desats on 2L/[place].  03/28/15 CXR: IMPRESSION: Stable  cardiomegaly and interstitial prominence. No focal consolidation.  Preoperative labs noted. Cr 1.36. Glucose 147. H/H 15.0/46.1. PT/INR 15.9/1.26. A1C 7.0 on 03/29/15.  Patient tolerated surgery in 01/2015. She has had an admission for CHF exacerbation since and has been started on bedtime O2. She has had follow-up with pulmonology and cardiology and was cleared by her cardiologist. She denied any acute cardiopulmonary issues today. If no acute changes then I anticipate that she can proceed as planned. Reviewed with anesthesiologist Dr. Gifford Shave.  Nursing staff to follow-up on PPM form.   [facility] Short Stay Center/Anesthesiology Phone [phone] 09/27/2015 4:26 PM

## 2015-10-02 MED ORDER — DEXTROSE 5 % IV SOLN
3.0000 g | INTRAVENOUS | Status: AC
  Administered 2015-10-03: 3 g via INTRAVENOUS
  Filled 2015-10-02: qty 3000

## 2015-10-03 ENCOUNTER — Observation Stay (HOSPITAL_COMMUNITY)
Admission: RE | Admit: 2015-10-03 | Discharge: 2015-10-04 | Disposition: A | Discharge status: Home / Self Care | Payer: Medicare Other | Source: Ambulatory Visit | Attending: Surgery | Admitting: Surgery

## 2015-10-03 ENCOUNTER — Ambulatory Visit (HOSPITAL_COMMUNITY): Payer: Medicare Other | Admitting: Vascular Surgery

## 2015-10-03 ENCOUNTER — Encounter (HOSPITAL_COMMUNITY)
Admission: RE | Disposition: A | Discharge status: Home / Self Care | Payer: Self-pay | Source: Ambulatory Visit | Attending: Surgery

## 2015-10-03 ENCOUNTER — Ambulatory Visit (HOSPITAL_COMMUNITY): Payer: Medicare Other | Admitting: Anesthesiology

## 2015-10-03 ENCOUNTER — Encounter (HOSPITAL_COMMUNITY): Payer: Self-pay | Admitting: Surgery

## 2015-10-03 DIAGNOSIS — G4730 Sleep apnea, unspecified: Secondary | ICD-10-CM | POA: Insufficient documentation

## 2015-10-03 DIAGNOSIS — Z7951 Long term (current) use of inhaled steroids: Secondary | ICD-10-CM | POA: Diagnosis not present

## 2015-10-03 DIAGNOSIS — E1151 Type 2 diabetes mellitus with diabetic peripheral angiopathy without gangrene: Secondary | ICD-10-CM | POA: Diagnosis not present

## 2015-10-03 DIAGNOSIS — K219 Gastro-esophageal reflux disease without esophagitis: Secondary | ICD-10-CM | POA: Insufficient documentation

## 2015-10-03 DIAGNOSIS — Z87891 Personal history of nicotine dependence: Secondary | ICD-10-CM | POA: Diagnosis not present

## 2015-10-03 DIAGNOSIS — Z79899 Other long term (current) drug therapy: Secondary | ICD-10-CM | POA: Insufficient documentation

## 2015-10-03 DIAGNOSIS — Z7901 Long term (current) use of anticoagulants: Secondary | ICD-10-CM | POA: Insufficient documentation

## 2015-10-03 DIAGNOSIS — I509 Heart failure, unspecified: Secondary | ICD-10-CM | POA: Diagnosis not present

## 2015-10-03 DIAGNOSIS — I110 Hypertensive heart disease with heart failure: Secondary | ICD-10-CM | POA: Insufficient documentation

## 2015-10-03 DIAGNOSIS — E210 Primary hyperparathyroidism: Secondary | ICD-10-CM | POA: Diagnosis not present

## 2015-10-03 DIAGNOSIS — J449 Chronic obstructive pulmonary disease, unspecified: Secondary | ICD-10-CM | POA: Insufficient documentation

## 2015-10-03 DIAGNOSIS — J45909 Unspecified asthma, uncomplicated: Secondary | ICD-10-CM | POA: Insufficient documentation

## 2015-10-03 DIAGNOSIS — I2510 Atherosclerotic heart disease of native coronary artery without angina pectoris: Secondary | ICD-10-CM | POA: Insufficient documentation

## 2015-10-03 DIAGNOSIS — Z794 Long term (current) use of insulin: Secondary | ICD-10-CM | POA: Diagnosis not present

## 2015-10-03 DIAGNOSIS — Z950 Presence of cardiac pacemaker: Secondary | ICD-10-CM | POA: Insufficient documentation

## 2015-10-03 DIAGNOSIS — E039 Hypothyroidism, unspecified: Secondary | ICD-10-CM | POA: Diagnosis not present

## 2015-10-03 HISTORY — DX: Dependence on supplemental oxygen: Z99.81

## 2015-10-03 HISTORY — PX: PARATHYROIDECTOMY: SHX19

## 2015-10-03 LAB — GLUCOSE, CAPILLARY
Glucose-Capillary: 108 mg/dL — ABNORMAL HIGH (ref 65–99)
Glucose-Capillary: 121 mg/dL — ABNORMAL HIGH (ref 65–99)

## 2015-10-03 SURGERY — PARATHYROIDECTOMY
Anesthesia: General | Site: Neck | Laterality: Left

## 2015-10-03 MED ORDER — GLYCOPYRROLATE 0.2 MG/ML IJ SOLN
INTRAMUSCULAR | Status: DC | PRN
  Administered 2015-10-03: .6 mg via INTRAVENOUS

## 2015-10-03 MED ORDER — ONDANSETRON HCL 4 MG/2ML IJ SOLN
INTRAMUSCULAR | Status: DC | PRN
  Administered 2015-10-03: 4 mg via INTRAVENOUS

## 2015-10-03 MED ORDER — ARTIFICIAL TEARS OP OINT
TOPICAL_OINTMENT | OPHTHALMIC | Status: DC | PRN
  Administered 2015-10-03: 1 via OPHTHALMIC

## 2015-10-03 MED ORDER — LIDOCAINE HCL (CARDIAC) 20 MG/ML IV SOLN
INTRAVENOUS | Status: DC | PRN
  Administered 2015-10-03: 40 mg via INTRAVENOUS

## 2015-10-03 MED ORDER — TIOTROPIUM BROMIDE MONOHYDRATE 2.5 MCG/ACT IN AERS: 2.0000 | INHALATION_SPRAY | Freq: Every day | RESPIRATORY_TRACT | Status: DC

## 2015-10-03 MED ORDER — FUROSEMIDE 80 MG PO TABS
80.0000 mg | ORAL_TABLET | Freq: Two times a day (BID) | ORAL | Status: DC
  Administered 2015-10-04: 80 mg via ORAL
  Filled 2015-10-03: qty 1

## 2015-10-03 MED ORDER — KCL IN DEXTROSE-NACL 20-5-0.45 MEQ/L-%-% IV SOLN
INTRAVENOUS | Status: DC
  Administered 2015-10-03: 17:00:00 via INTRAVENOUS
  Filled 2015-10-03: qty 1000

## 2015-10-03 MED ORDER — ONDANSETRON HCL 4 MG/2ML IJ SOLN
4.0000 mg | Freq: Four times a day (QID) | INTRAMUSCULAR | Status: DC | PRN
Start: 2015-10-03 — End: 2015-10-04
  Administered 2015-10-03: 4 mg via INTRAVENOUS
  Filled 2015-10-03: qty 2

## 2015-10-03 MED ORDER — LOSARTAN POTASSIUM 50 MG PO TABS
50.0000 mg | ORAL_TABLET | Freq: Every day | ORAL | Status: DC
  Administered 2015-10-03: 50 mg via ORAL
  Filled 2015-10-03: qty 1

## 2015-10-03 MED ORDER — SULFASALAZINE 500 MG PO TABS
1000.0000 mg | ORAL_TABLET | Freq: Two times a day (BID) | ORAL | Status: DC
  Administered 2015-10-03: 1000 mg via ORAL
  Filled 2015-10-03 (×3): qty 2

## 2015-10-03 MED ORDER — LACTATED RINGERS IV SOLN
INTRAVENOUS | Status: DC
  Administered 2015-10-03: 12:00:00 via INTRAVENOUS

## 2015-10-03 MED ORDER — ACETAMINOPHEN 325 MG PO TABS
650.0000 mg | ORAL_TABLET | Freq: Four times a day (QID) | ORAL | Status: DC | PRN
Start: 2015-10-03 — End: 2015-10-04

## 2015-10-03 MED ORDER — NEOSTIGMINE METHYLSULFATE 10 MG/10ML IV SOLN
INTRAVENOUS | Status: DC | PRN
  Administered 2015-10-03: 4 mg via INTRAVENOUS

## 2015-10-03 MED ORDER — DEXAMETHASONE SODIUM PHOSPHATE 4 MG/ML IJ SOLN
INTRAMUSCULAR | Status: AC
  Filled 2015-10-03: qty 4

## 2015-10-03 MED ORDER — TIOTROPIUM BROMIDE MONOHYDRATE 18 MCG IN CAPS
18.0000 ug | ORAL_CAPSULE | Freq: Every day | RESPIRATORY_TRACT | Status: DC
  Filled 2015-10-03: qty 5

## 2015-10-03 MED ORDER — TIZANIDINE HCL 2 MG PO TABS
4.0000 mg | ORAL_TABLET | Freq: Three times a day (TID) | ORAL | Status: DC | PRN
  Administered 2015-10-04: 4 mg via ORAL
  Filled 2015-10-03: qty 2

## 2015-10-03 MED ORDER — INSULIN GLARGINE 100 UNIT/ML ~~LOC~~ SOLN
20.0000 [IU] | SUBCUTANEOUS | Status: DC
  Administered 2015-10-04: 20 [IU] via SUBCUTANEOUS
  Filled 2015-10-03 (×2): qty 0.2

## 2015-10-03 MED ORDER — METOPROLOL TARTRATE 100 MG PO TABS
100.0000 mg | ORAL_TABLET | Freq: Two times a day (BID) | ORAL | Status: DC
  Administered 2015-10-03: 100 mg via ORAL
  Filled 2015-10-03: qty 1

## 2015-10-03 MED ORDER — FENTANYL CITRATE (PF) 100 MCG/2ML IJ SOLN: 25.0000 ug | INTRAMUSCULAR | Status: DC | PRN

## 2015-10-03 MED ORDER — FENTANYL CITRATE (PF) 250 MCG/5ML IJ SOLN
INTRAMUSCULAR | Status: AC
  Filled 2015-10-03: qty 5

## 2015-10-03 MED ORDER — LIDOCAINE HCL (CARDIAC) 20 MG/ML IV SOLN
INTRAVENOUS | Status: AC
  Filled 2015-10-03: qty 5

## 2015-10-03 MED ORDER — LEVOTHYROXINE SODIUM 75 MCG PO TABS
75.0000 ug | ORAL_TABLET | Freq: Every day | ORAL | Status: DC
  Administered 2015-10-04: 75 ug via ORAL
  Filled 2015-10-03: qty 1

## 2015-10-03 MED ORDER — RANOLAZINE ER 500 MG PO TB12
500.0000 mg | ORAL_TABLET | Freq: Two times a day (BID) | ORAL | Status: DC
  Administered 2015-10-03: 500 mg via ORAL
  Filled 2015-10-03 (×3): qty 1

## 2015-10-03 MED ORDER — MOMETASONE FURO-FORMOTEROL FUM 100-5 MCG/ACT IN AERO
2.0000 | INHALATION_SPRAY | Freq: Two times a day (BID) | RESPIRATORY_TRACT | Status: DC
  Administered 2015-10-03: 2 via RESPIRATORY_TRACT
  Filled 2015-10-03: qty 8.8

## 2015-10-03 MED ORDER — FENTANYL CITRATE (PF) 100 MCG/2ML IJ SOLN
INTRAMUSCULAR | Status: DC | PRN
  Administered 2015-10-03 (×4): 50 ug via INTRAVENOUS

## 2015-10-03 MED ORDER — ROCURONIUM BROMIDE 100 MG/10ML IV SOLN
INTRAVENOUS | Status: DC | PRN
  Administered 2015-10-03: 50 mg via INTRAVENOUS

## 2015-10-03 MED ORDER — POTASSIUM CHLORIDE CRYS ER 20 MEQ PO TBCR
20.0000 meq | EXTENDED_RELEASE_TABLET | Freq: Two times a day (BID) | ORAL | Status: DC
  Administered 2015-10-03: 20 meq via ORAL
  Filled 2015-10-03: qty 1

## 2015-10-03 MED ORDER — HYDROCODONE-ACETAMINOPHEN 5-325 MG PO TABS
1.0000 | ORAL_TABLET | ORAL | Status: DC | PRN
  Administered 2015-10-03 – 2015-10-04 (×3): 2 via ORAL
  Filled 2015-10-03 (×3): qty 2

## 2015-10-03 MED ORDER — PHENOL 1.4 % MT LIQD
1.0000 | OROMUCOSAL | Status: DC | PRN
  Administered 2015-10-03: 1 via OROMUCOSAL
  Filled 2015-10-03: qty 177

## 2015-10-03 MED ORDER — 0.9 % SODIUM CHLORIDE (POUR BTL) OPTIME
TOPICAL | Status: DC | PRN
  Administered 2015-10-03: 1000 mL

## 2015-10-03 MED ORDER — ONDANSETRON HCL 4 MG/2ML IJ SOLN
INTRAMUSCULAR | Status: AC
  Filled 2015-10-03: qty 2

## 2015-10-03 MED ORDER — HYDROMORPHONE HCL 1 MG/ML IJ SOLN: 1.0000 mg | INTRAMUSCULAR | Status: DC | PRN

## 2015-10-03 MED ORDER — ALBUTEROL SULFATE HFA 108 (90 BASE) MCG/ACT IN AERS: 2.0000 | INHALATION_SPRAY | Freq: Four times a day (QID) | RESPIRATORY_TRACT | Status: DC | PRN

## 2015-10-03 MED ORDER — PROPOFOL 10 MG/ML IV BOLUS
INTRAVENOUS | Status: DC | PRN
Start: 2015-10-03 — End: 2015-10-03
  Administered 2015-10-03: 30 mg via INTRAVENOUS

## 2015-10-03 MED ORDER — BUPIVACAINE HCL (PF) 0.25 % IJ SOLN
INTRAMUSCULAR | Status: AC
  Filled 2015-10-03: qty 30

## 2015-10-03 MED ORDER — NITROGLYCERIN 0.4 MG SL SUBL: 0.4000 mg | SUBLINGUAL_TABLET | SUBLINGUAL | Status: DC | PRN

## 2015-10-03 MED ORDER — IPRATROPIUM-ALBUTEROL 0.5-2.5 (3) MG/3ML IN SOLN: 3.0000 mL | Freq: Four times a day (QID) | RESPIRATORY_TRACT | Status: DC | PRN

## 2015-10-03 MED ORDER — ARTIFICIAL TEARS OP OINT
TOPICAL_OINTMENT | OPHTHALMIC | Status: AC
  Filled 2015-10-03: qty 3.5

## 2015-10-03 MED ORDER — ONDANSETRON HCL 4 MG/2ML IJ SOLN: 4.0000 mg | Freq: Once | INTRAMUSCULAR | Status: DC | PRN

## 2015-10-03 MED ORDER — ALBUTEROL SULFATE (2.5 MG/3ML) 0.083% IN NEBU: 2.5000 mg | INHALATION_SOLUTION | Freq: Four times a day (QID) | RESPIRATORY_TRACT | Status: DC | PRN

## 2015-10-03 MED ORDER — BUPIVACAINE HCL (PF) 0.25 % IJ SOLN
INTRAMUSCULAR | Status: DC | PRN
  Administered 2015-10-03: 10 mL

## 2015-10-03 MED ORDER — HEMOSTATIC AGENTS (NO CHARGE) OPTIME
TOPICAL | Status: DC | PRN
  Administered 2015-10-03: 1

## 2015-10-03 MED ORDER — ACETAMINOPHEN 650 MG RE SUPP: 650.0000 mg | Freq: Four times a day (QID) | RECTAL | Status: DC | PRN

## 2015-10-03 MED ORDER — PROPOFOL 10 MG/ML IV BOLUS
INTRAVENOUS | Status: AC
  Filled 2015-10-03: qty 20

## 2015-10-03 MED ORDER — ONDANSETRON 4 MG PO TBDP: 4.0000 mg | ORAL_TABLET | Freq: Four times a day (QID) | ORAL | Status: DC | PRN

## 2015-10-03 SURGICAL SUPPLY — 57 items
APL SKNCLS STERI-STRIP NONHPOA (GAUZE/BANDAGES/DRESSINGS) ×1
ATTRACTOMAT 16X20 MAGNETIC DRP (DRAPES) ×3 IMPLANT
BENZOIN TINCTURE PRP APPL 2/3 (GAUZE/BANDAGES/DRESSINGS) ×3 IMPLANT
BLADE SURG 10 STRL SS (BLADE) ×3 IMPLANT
BLADE SURG 15 STRL LF DISP TIS (BLADE) ×1 IMPLANT
BLADE SURG 15 STRL SS (BLADE) ×3
CANISTER SUCTION 2500CC (MISCELLANEOUS) ×3 IMPLANT
CHLORAPREP W/TINT 26ML (MISCELLANEOUS) ×3 IMPLANT
CLIP TI MEDIUM 6 (CLIP) ×5 IMPLANT
CLIP TI WIDE RED SMALL 6 (CLIP) ×3 IMPLANT
CLOSURE WOUND 1/2 X4 (GAUZE/BANDAGES/DRESSINGS) ×1
CONT SPEC 4OZ CLIKSEAL STRL BL (MISCELLANEOUS) ×3 IMPLANT
COVER SURGICAL LIGHT HANDLE (MISCELLANEOUS) ×3 IMPLANT
CRADLE DONUT ADULT HEAD (MISCELLANEOUS) ×3 IMPLANT
DRAPE PED LAPAROTOMY (DRAPES) ×3 IMPLANT
DRAPE UTILITY XL STRL (DRAPES) ×3 IMPLANT
ELECT CAUTERY BLADE 6.4 (BLADE) ×3 IMPLANT
ELECT REM PT RETURN 9FT ADLT (ELECTROSURGICAL) ×3
ELECTRODE REM PT RTRN 9FT ADLT (ELECTROSURGICAL) ×1 IMPLANT
GAUZE SPONGE 2X2 8PLY STRL LF (GAUZE/BANDAGES/DRESSINGS) ×1 IMPLANT
GAUZE SPONGE 4X4 16PLY XRAY LF (GAUZE/BANDAGES/DRESSINGS) ×3 IMPLANT
GLOVE BIO SURGEON STRL SZ7.5 (GLOVE) ×2 IMPLANT
GLOVE BIOGEL PI IND STRL 7.0 (GLOVE) IMPLANT
GLOVE BIOGEL PI IND STRL 7.5 (GLOVE) IMPLANT
GLOVE BIOGEL PI INDICATOR 7.0 (GLOVE) ×2
GLOVE BIOGEL PI INDICATOR 7.5 (GLOVE) ×2
GLOVE SURG ORTHO 8.0 STRL STRW (GLOVE) ×3 IMPLANT
GLOVE SURG SS PI 6.5 STRL IVOR (GLOVE) ×2 IMPLANT
GOWN STRL REUS W/ TWL LRG LVL3 (GOWN DISPOSABLE) ×1 IMPLANT
GOWN STRL REUS W/ TWL XL LVL3 (GOWN DISPOSABLE) ×1 IMPLANT
GOWN STRL REUS W/TWL LRG LVL3 (GOWN DISPOSABLE) ×6
GOWN STRL REUS W/TWL XL LVL3 (GOWN DISPOSABLE) ×3
HEMOSTAT SURGICEL 2X4 FIBR (HEMOSTASIS) ×3 IMPLANT
KIT BASIN OR (CUSTOM PROCEDURE TRAY) ×3 IMPLANT
KIT ROOM TURNOVER OR (KITS) ×3 IMPLANT
NDL HYPO 25GX1X1/2 BEV (NEEDLE) ×1 IMPLANT
NEEDLE HYPO 25GX1X1/2 BEV (NEEDLE) ×3 IMPLANT
NS IRRIG 1000ML POUR BTL (IV SOLUTION) ×3 IMPLANT
PACK SURGICAL SETUP 50X90 (CUSTOM PROCEDURE TRAY) ×3 IMPLANT
PAD ARMBOARD 7.5X6 YLW CONV (MISCELLANEOUS) ×3 IMPLANT
PENCIL BUTTON HOLSTER BLD 10FT (ELECTRODE) ×3 IMPLANT
SPONGE GAUZE 2X2 STER 10/PKG (GAUZE/BANDAGES/DRESSINGS) ×2
SPONGE INTESTINAL PEANUT (DISPOSABLE) ×2 IMPLANT
STRIP CLOSURE SKIN 1/2X4 (GAUZE/BANDAGES/DRESSINGS) ×2 IMPLANT
SUT MNCRL AB 4-0 PS2 18 (SUTURE) ×3 IMPLANT
SUT SILK 2 0 (SUTURE)
SUT SILK 2-0 18XBRD TIE 12 (SUTURE) IMPLANT
SUT SILK 3 0 (SUTURE)
SUT SILK 3-0 18XBRD TIE 12 (SUTURE) IMPLANT
SUT VIC AB 3-0 SH 18 (SUTURE) ×3 IMPLANT
SYR BULB 3OZ (MISCELLANEOUS) ×3 IMPLANT
SYR CONTROL 10ML LL (SYRINGE) ×2 IMPLANT
TAPE CLOTH SURG 4X10 WHT LF (GAUZE/BANDAGES/DRESSINGS) ×2 IMPLANT
TOWEL OR 17X24 6PK STRL BLUE (TOWEL DISPOSABLE) ×3 IMPLANT
TOWEL OR 17X26 10 PK STRL BLUE (TOWEL DISPOSABLE) ×3 IMPLANT
TUBE CONNECTING 12'X1/4 (SUCTIONS) ×1
TUBE CONNECTING 12X1/4 (SUCTIONS) ×2 IMPLANT

## 2015-10-03 NOTE — Progress Notes (Signed)
Per Dr. Caryl Comes note no post op interrogation needed.

## 2015-10-03 NOTE — Brief Op Note (Signed)
10/03/2015  3:08 PM  PATIENT:  [name]  75 y.o. female  PRE-OPERATIVE DIAGNOSIS:  Primary hyperparathyroidism  POST-OPERATIVE DIAGNOSIS:  Primary hyperparathyroidism  PROCEDURE:  Left inferior parathyroidectomy, left neck exploration  SURGEON:  Surgeon(s) and Role:    * Armandina Gemma, MD - Primary  ASSISTANTS:  Sharyn Dross, RNFA   ANESTHESIA:   general  EBL:  Total I/O In: 500 [I.V.:500] Out: 20 [Blood:20]  BLOOD ADMINISTERED:none  DRAINS: none   LOCAL MEDICATIONS USED:  MARCAINE     SPECIMEN:  Excision  DISPOSITION OF SPECIMEN:  PATHOLOGY  COUNTS:  YES  TOURNIQUET:  * No tourniquets in log *  DICTATION: .Other Dictation: Dictation Number [id number]  PLAN OF CARE: Admit for overnight observation  PATIENT DISPOSITION:  PACU - hemodynamically stable.   Delay start of Pharmacological VTE agent (>24hrs) due to surgical blood loss or risk of bleeding: yes  Earnstine Regal, MD, [facility] Office: [phone]

## 2015-10-03 NOTE — Anesthesia Preprocedure Evaluation (Addendum)
Anesthesia Evaluation  Patient identified by MRN, date of birth, ID band Patient awake    Reviewed: Allergy & Precautions, NPO status , Patient's Chart, lab work & pertinent test results, reviewed documented beta blocker date and time   Airway Mallampati: II       Dental  (+) Teeth Intact   Pulmonary asthma , sleep apnea , COPD,  COPD inhaler, former smoker,    breath sounds clear to auscultation       Cardiovascular hypertension, Pt. on medications and Pt. on home beta blockers + CAD, + Peripheral Vascular Disease and +CHF  + dysrhythmias + pacemaker  Rhythm:Regular Rate:Normal     Neuro/Psych PSYCHIATRIC DISORDERS Anxiety Depression negative neurological ROS     GI/Hepatic Neg liver ROS, GERD  Medicated,  Endo/Other  diabetesHypothyroidism   Renal/GU CRFRenal disease  negative genitourinary   Musculoskeletal negative musculoskeletal ROS (+)   Abdominal   Peds negative pediatric ROS (+)  Hematology negative hematology ROS (+)   Anesthesia Other Findings   Reproductive/Obstetrics negative OB ROS                           Lab Results  Component Value Date   WBC 8.2 09/27/2015   HGB 15.0 09/27/2015   HCT 46.1* 09/27/2015   MCV 101.1* 09/27/2015   PLT 259 09/27/2015   Lab Results  Component Value Date   CREATININE 1.36* 09/27/2015   BUN 23* 09/27/2015   NA 139 09/27/2015   K 4.3 09/27/2015   CL 102 09/27/2015   CO2 28 09/27/2015   Lab Results  Component Value Date   INR 1.26 09/27/2015   INR 1.37 03/29/2015   INR 1.02 01/30/2015   PROTIME 21.4 03/06/2009   01/2015: Echo - Left ventricle: Poor acoustic windows limit study Overall LVEF appears normal to mildly depressed. The cavity size was normal. Wall thickness was increased in a pattern of moderate LVH. - Mitral valve: There was mild regurgitation. - Left atrium: The atrium was moderately dilated. - Right ventricle:  The cavity size was severely dilated. Systolic function was moderately to severely reduced. - Right atrium: The atrium was severely dilated. - Pulmonary arteries: PA peak pressure: 73 mm Hg (S).  07/2015: EKG Pacemaker - Ventricular Paced   Anesthesia Physical Anesthesia Plan  ASA: III  Anesthesia Plan: General   Post-op Pain Management:    Induction: Intravenous  Airway Management Planned: Oral ETT  Additional Equipment:   Intra-op Plan:   Post-operative Plan: Extubation in OR  Informed Consent: I have reviewed the patients History and Physical, chart, labs and discussed the procedure including the risks, benefits and alternatives for the proposed anesthesia with the patient or authorized representative who has indicated his/her understanding and acceptance.   Dental advisory given  Plan Discussed with: CRNA  Anesthesia Plan Comments:         Anesthesia Quick Evaluation

## 2015-10-03 NOTE — Transfer of Care (Signed)
Immediate Anesthesia Transfer of Care Note  Patient: [name]  Procedure(s) Performed: Procedure(s): LEFT PARATHYROID EXPLORATION AND  PARATHYROIDECTOMY (Left)  Patient Location: PACU  Anesthesia Type:General  Level of Consciousness: awake, alert , oriented and patient cooperative  Airway & Oxygen Therapy: Patient Spontanous Breathing and Patient connected to nasal cannula oxygen  Post-op Assessment: Report given to RN and Post -op Vital signs reviewed and stable  Post vital signs: Reviewed and stable  Last Vitals:  Filed Vitals:   10/03/15 1137 10/03/15 1507  BP: 136/86   Pulse: 90   Temp: 36.3 C 36.2 C  Resp: 20     Complications: No apparent anesthesia complications

## 2015-10-03 NOTE — Interval H&P Note (Signed)
History and Physical Interval Note:  10/03/2015 1:11 PM  [name]  has presented today for surgery, with the diagnosis of Primary hyperparathyroidism  The various methods of treatment have been discussed with the patient and family. After consideration of risks, benefits and other options for treatment, the patient has consented to    Procedure(s): LEFT PARATHYROID EXPLORATION AND  PARATHYROIDECTOMY (Left) as a surgical intervention .    The patient's history has been reviewed, patient examined, no change in status, stable for surgery.  I have reviewed the patient's chart and labs.  Questions were answered to the patient's satisfaction.    Earnstine Regal, MD, [facility] Office: [place]

## 2015-10-03 NOTE — H&P (Signed)
General Surgery [facility]  Cathleen Corti DOB: Oct 15, 1940 Divorced / Language: English / Race: White Female  History of Present Illness The patient is a 75 year old female who presents with a parathyroid neoplasm.  Patient returns for follow-up of primary hyperparathyroidism. Patient has a complex history including right superior and inferior minimally invasive parathyroidectomy performed in May 2016. Both parathyroid glands that were removed showed hyperplasia. Postoperatively her calcium and parathyroid hormone levels have failed to normalize. Recent laboratory testing on July 05, 2015 shows a persistently elevated serum calcium level of 10.7 and a persistently elevated intact PTH level of 136. 25-hydroxy vitamin D level was also checked concurrently and was low at 19.8. Patient continues to note chronic fatigue as well as bilateral hip pain. She does have muscle weakness. She has not had any further imaging studies performed.  Allergies  No Known Drug Allergies03/09/2014  Medication History Spiriva Respimat (2.5MCG/ACT Aerosol Soln, Inhalation) Active. Ranexa (500MG  Tablet ER 12HR, Oral) Active. Albuterol Sulfate (108 (90 Base)MCG/ACT Aero Pow Br Act, Inhalation) Active. PriLOSEC (20MG  Capsule DR, Oral) Active. Multivitamin/Multimineral (Oral) Active. Fiber (0.52GM Capsule, Oral) Active. Claritin (10MG  Capsule, Oral) Active. DuoNeb (0.5-2.5 (3)MG/3ML Solution, Inhalation) Active. FreeStyle Lancets Active. FreeStyle Lite Test (In Vitro) Active. Advair Diskus (500-50MCG/DOSE Aero Pow Br Act, Inhalation) Active. Atorvastatin Calcium (80MG  Tablet, Oral) Active. Eliquis (5MG  Tablet, Oral) Active. Furosemide (40MG  Tablet, Oral) Active. Klor-Con M20 [facility] Tablet ER, Oral) Active. Lantus (100UNIT/ML Solution, Subcutaneous) Active. Levothyroxine Sodium (75MCG Tablet, Oral) Active. Losartan Potassium (50MG  Tablet, Oral)  Active. Metoprolol Tartrate (100MG  Tablet, Oral) Active. Nitrostat (0.4MG  Tab Sublingual, Sublingual) Active. SulfaSALAzine (500MG  Tablet, Oral) Active. Folic Acid (1MG  Tablet, Oral) Active. Medications Reconciled  Vitals Height: 71in Weight Measurement Declined Temp.: 97.67F(Temporal)  Pulse: 80 (Regular)  BP: 134/76 (Sitting, Left Arm, Standard)  Physical Exam General - appears comfortable, no distress; not diaphorectic  HEENT - normocephalic; sclerae clear, gaze conjugate; mucous membranes moist, dentition good; voice normal  Neck - symmetric on extension; no palpable anterior or posterior cervical adenopathy; no palpable masses in the thyroid bed; well-healed anterior cervical incision with good cosmetic result  Chest - clear bilaterally without rhonchi, rales, or wheeze  Cor - normal rate; no significant murmur  Ext - non-tender with mild edema or lymphedema  Neuro - grossly intact; no tremor  Assessment & Plan  PRIMARY HYPERPARATHYROIDISM (E21.0)  The patient and I reviewed her recent laboratory studies and discussed her previous surgical pathology. 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 Her vitamin D level is slightly low but I do not think this is enough to cause her persistent laboratory abnormalities.  We discussed further options for management. I have recommended proceeding with left neck exploration and probable her thyroidectomy. We may end up removing one and a half more glands in hopes of treating her for gland hyperplasia. We discussed the possibility of ectopic parathyroid tissue. I would like to supplement her vitamin D levels at this time.  Patient would like to proceed with surgery in the near future. We will make those arrangements.  The risks and benefits of the procedure have been discussed at length with the patient. The patient understands the proposed  procedure, potential alternative treatments, and the course of recovery to be expected. All of the patient's questions have been answered at this time. The patient wishes to proceed with surgery.  Earnstine Regal, MD, FACS General & Endocrine  [facility] Office: [phone]

## 2015-10-03 NOTE — Anesthesia Procedure Notes (Signed)
Procedure Name: Intubation Date/Time: 10/03/2015 1:44 PM Performed by: Willeen Cass P Pre-anesthesia Checklist: Patient identified, Timeout performed, Emergency Drugs available, Suction available and Patient being monitored Patient Re-evaluated:Patient Re-evaluated prior to inductionOxygen Delivery Method: Circle system utilized Preoxygenation: Pre-oxygenation with 100% oxygen Intubation Type: IV induction Ventilation: Mask ventilation without difficulty and Oral airway inserted - appropriate to patient size Laryngoscope Size: Mac and 3 Grade View: Grade I Tube type: Oral Tube size: 7.0 mm Number of attempts: 1 Airway Equipment and Method: Stylet Placement Confirmation: ETT inserted through vocal cords under direct vision,  breath sounds checked- equal and bilateral and positive ETCO2 Secured at: 22 cm Tube secured with: Tape Dental Injury: Teeth and Oropharynx as per pre-operative assessment

## 2015-10-04 ENCOUNTER — Encounter: Payer: Medicare Other | Admitting: *Deleted

## 2015-10-04 ENCOUNTER — Encounter (HOSPITAL_COMMUNITY): Payer: Self-pay | Admitting: Surgery

## 2015-10-04 ENCOUNTER — Telehealth: Payer: Self-pay | Admitting: Cardiology

## 2015-10-04 DIAGNOSIS — E210 Primary hyperparathyroidism: Secondary | ICD-10-CM | POA: Diagnosis not present

## 2015-10-04 LAB — BASIC METABOLIC PANEL
ANION GAP: 10 (ref 5–15)
BUN: 22 mg/dL — ABNORMAL HIGH (ref 6–20)
CALCIUM: 9.5 mg/dL (ref 8.9–10.3)
CO2: 24 mmol/L (ref 22–32)
CREATININE: 1.22 mg/dL — AB (ref 0.44–1.00)
Chloride: 102 mmol/L (ref 101–111)
GFR, EST AFRICAN AMERICAN: 49 mL/min — AB (ref >60)
GFR, EST NON AFRICAN AMERICAN: 42 mL/min — AB (ref >60)
Glucose, Bld: 130 mg/dL — ABNORMAL HIGH (ref 65–99)
Potassium: 4.9 mmol/L (ref 3.5–5.1)
SODIUM: 136 mmol/L (ref 135–145)

## 2015-10-04 LAB — GLUCOSE, CAPILLARY: Glucose-Capillary: 123 mg/dL — ABNORMAL HIGH (ref 65–99)

## 2015-10-04 MED ORDER — HYDROCODONE-ACETAMINOPHEN 5-325 MG PO TABS: 1.0000 | ORAL_TABLET | ORAL | Status: DC | PRN

## 2015-10-04 NOTE — Op Note (Signed)
NAMEVALLI, RIDENER NO.:  [id number]  MEDICAL RECORD NO.:  [id number]  LOCATION:  6N06C                        FACILITY:  [facility]  PHYSICIAN:  Earnstine Regal, MD      DATE OF BIRTH:  03-14-1941  DATE OF PROCEDURE:  10/03/2015                              OPERATIVE REPORT   PREOPERATIVE DIAGNOSIS:  Primary hyperparathyroidism.  POSTOPERATIVE DIAGNOSIS:  Primary hyperparathyroidism.  PROCEDURE:  Left inferior parathyroidectomy.  SURGEON:  Earnstine Regal, M.D.  ASSISTANT:  Sharyn Dross, RNFA.  ESTIMATED BLOOD LOSS:  Minimal.  PREPARATION:  ChloraPrep.  COMPLICATIONS:  None.  INDICATIONS:  The patient is a 75 year old female who underwent right superior and inferior parathyroidectomy in May 2016.  Pathology showed hyperplasia involving both glands.  Postoperatively, the patient had persistently elevated calcium level of 10.7 with an intact PTH level of 136.  Previous diagnostic studies including ultrasound and sestamibi scan had failed to identify parathyroid adenoma.  After a period of observation, decision was made to bring the patient back to the operating room to explore the left side of the neck for possible 4-gland hyperplasia.  BODY OF REPORT:  Procedure was performed in OR #2 at the [facility].  The patient was brought to the operating room, placed in supine position on the operating room table.  Following administration of general anesthesia, the patient was positioned and then prepped and draped in the usual aseptic fashion.  After ascertaining that an adequate level of anesthesia had been achieved, a left neck incision was made with a #15 blade.  Dissection was carried through subcutaneous tissues and platysma.  Hemostasis was achieved with electrocautery.  Skin flaps were elevated cephalad and caudad, and a Weitlaner retractor placed for exposure.  Strap muscles were incised in the midline and reflected to the left.  Left  thyroid lobe was gently dissected out.  Left lobe was slightly nodular with a large cyst at the inferior pole.  It was gently mobilized.  Middle thyroid vein was divided between Ligaclips.  Gland was rolled anteriorly.  Exploration revealed a markedly enlarged parathyroid gland at the inferior pole. This was extending into the thyrothymic tract and is consistent with a left inferior parathyroid adenoma.  Gland was gently dissected out and vascular tributaries divided between Ligaclips.  Gland was excised in its entirety and placed in saline.  Further dissection in the left neck allows for complete mobilization of the left thyroid lobe.  Dissection was carried around the superior pole. The hypopharynx was identified.  The dissection was carried back to the precervical fascia.  The retroesophageal space was opened.  There were 2 small areas, which are possibly representing suppressed superior parathyroid glands.  They are normal in size.  Decision was made to not perform biopsy for fear of rendering the patient permanently hypoparathyroid.  No other enlarged gland is identified within the operative field.  The inferior gland is then submitted to Pathology, and frozen section confirms parathyroid tissue with hyperplasia.  It weighs over 1.4 g.  Further dissection was then discontinued.  The risk of permanent hypoparathyroidism is great, and there does not appear to be another enlarged parathyroid gland within  the operative field.  Neck was irrigated with warm saline.  Fibrillar was placed throughout the operative field.  Strap muscles were reapproximated in the midline with interrupted 3-0 Vicryl sutures.  Platysma was closed with interrupted 3- 0 Vicryl sutures.  Skin was closed with a running 4-0 Monocryl subcuticular suture.  Wound was washed and dried.  Benzoin and Steri- Strips were applied.  Sterile dressings were applied.  The patient was awakened from anesthesia and brought to  the recovery room.  The patient tolerated the procedure well.   Earnstine Regal, MD, [facility] Office: [phone]   TMG/MEDQ  D:  10/03/2015  T:  10/03/2015  Job:  [id number]

## 2015-10-04 NOTE — Discharge Summary (Signed)
Physician Discharge Summary [facility]  Patient ID: [name] MRN: [id number] DOB/AGE: [birth year] 75 y.o.  Admit date: 10/03/2015 Discharge date: 10/04/2015  Admission Diagnoses:  Primary hyperparathyroidism  Discharge Diagnoses:  Principal Problem:   Primary hyperparathyroidism [facility])   Discharged Condition: good  Hospital Course: Patient was admitted for observation following parathyroid surgery.  Post op course was uncomplicated.  Pain was well controlled.  Tolerated diet.  Post op calcium level on morning following surgery was 9.5 mg/dl.  Patient was prepared for discharge home on POD#1.  Consults: None  Treatments: surgery: neck exploration and parathyroidectomy (left inferior)  Discharge Exam: Blood pressure 102/56, pulse 70, temperature 97.6 F (36.4 C), temperature source Oral, resp. rate 20, height 5\' 10"  (1.778 m), weight 127.3 kg (280 lb 10.3 oz), SpO2 97 %. HEENT - clear Neck - wound dry and intact; minimal STS; voice normal Chest - clear bilaterally Cor - RRR  Disposition: Home  Discharge Instructions    Diet - low sodium heart healthy    Complete by:  As directed      Discharge instructions    Complete by:  As directed   [facility]  THYROID & PARATHYROID SURGERY:  POST-OP INSTRUCTIONS  Always review your discharge instruction sheet from the facility where your surgery was performed.  A prescription for pain medication may be given to you upon discharge.  Take your pain medication as prescribed.  If narcotic pain medicine is not needed, then you may take acetaminophen (Tylenol) or ibuprofen (Advil) as needed.  Take your usually prescribed medications unless otherwise directed.  If you need a refill on your pain medication, please contact your pharmacy. They will contact our office to request authorization.  Prescriptions will not be processed by our office after 5 pm or on weekends.  Start with a light diet upon  arrival home, such as soup and crackers or toast.  Be sure to drink plenty of fluids daily.  Resume your normal diet the day after surgery.  Most patients will experience some swelling and bruising on the chest and neck area.  Ice packs will help.  Swelling and bruising can take several days to resolve.   It is common to experience some constipation after surgery.  Increasing fluid intake and taking a stool softener will usually help or prevent this problem.  A mild laxative (Milk of Magnesia or Miralax) should be taken according to package directions if there has been no bowel movement after 48 hours.  You have steri-strips and a gauze dressing over your incision.  You may remove the gauze bandage on the second day after surgery, and you may shower at that time.  Leave your steri-strips (small skin tapes) in place directly over the incision.  These strips should remain on the skin for 5-7 days and then be removed.  You may get them wet in the shower and pat them dry.  You may resume regular (light) daily activities beginning the next day - such as daily self-care, walking, climbing stairs - gradually increasing activities as tolerated.  You may have sexual intercourse when it is comfortable.  Refrain from any heavy lifting or straining until approved by your doctor.  You may drive when you no longer are taking prescription pain medication, you can comfortably wear a seatbelt, and you can safely maneuver your car and apply brakes.  You should see your doctor in the office for a follow-up appointment approximately two to three weeks after  your surgery.  Make sure that you call for this appointment within a day or two after you arrive home to insure a convenient appointment time.  WHEN TO CALL YOUR DOCTOR: -- Fever greater than 101.5 -- Inability to urinate -- Nausea and/or vomiting - persistent -- Extreme swelling or bruising -- Continued bleeding from incision -- Increased pain, redness, or  drainage from the incision -- Difficulty swallowing or breathing -- Muscle cramping or spasms -- Numbness or tingling in hands or around lips  The clinic staff is available to answer your questions during regular business hours.  Please don't hesitate to call and ask to speak to one of the nurses if you have concerns.  Earnstine Regal, MD, [facility] Office: [phone]  Website: [email or web address]     Increase activity slowly    Complete by:  As directed      Remove dressing in 24 hours    Complete by:  As directed             Medication List    TAKE these medications        albuterol 108 (90 Base) MCG/ACT inhaler  Commonly known as:  PROVENTIL HFA;VENTOLIN HFA  Inhale 2 puffs into the lungs every 6 (six) hours as needed for wheezing or shortness of breath.     atorvastatin 80 MG tablet  Commonly known as:  LIPITOR  Take 80 mg by mouth at bedtime.     colchicine 0.6 MG tablet  Take 1-2 tablets by mouth daily as needed (gout). TAKE 2 TABLETS BY MOUTH AS ONE DOSE, THEN 1 TABLET IN 1 HOUR FOR ACUTE GOUT EPISODE     ELIQUIS 5 MG Tabs tablet  Generic drug:  apixaban  TAKE 1 TABLET BY MOUTH TWICE A DAY     Fiber Caps  Take 1 tablet by mouth at bedtime.     Fluticasone-Salmeterol 250-50 MCG/DOSE Aepb  Commonly known as:  ADVAIR  Inhale 1 puff into the lungs every 12 (twelve) hours.     folic acid 1 MG tablet  Commonly known as:  FOLVITE  Take 1 mg by mouth daily.     furosemide 40 MG tablet  Commonly known as:  LASIX  TAKE 1 TABLET BY MOUTH EVERY MORNING AND TAKE 2 TABLETS EVERY EVENING     HYDROcodone-acetaminophen 7.5-325 MG tablet  Commonly known as:  NORCO  Take 1 tablet by mouth 2 (two) times daily as needed for moderate pain or severe pain.     HYDROcodone-acetaminophen 5-325 MG tablet  Commonly known as:  NORCO/VICODIN  Take 1-2 tablets by mouth every 4 (four) hours as needed for moderate  pain.     insulin glargine 100 UNIT/ML injection  Commonly known as:  LANTUS  Inject 0.2 mLs (20 Units total) into the skin every morning.     ipratropium-albuterol 0.5-2.5 (3) MG/3ML Soln  Commonly known as:  DUONEB  Take 3 mLs by nebulization every 6 (six) hours as needed (wheezing and or shortness of breath).     KLOR-CON M20 20 MEQ tablet  Generic drug:  potassium chloride SA  TAKE 1 TABLET BY MOUTH TWICE DAILY     levothyroxine 75 MCG tablet  Commonly known as:  SYNTHROID, LEVOTHROID  Take 75 mcg by mouth daily.     loratadine 10 MG tablet  Commonly known as:  CLARITIN  Take 10 mg by mouth as needed for allergies.     losartan 50 MG  tablet  Commonly known as:  COZAAR  Take 50 mg by mouth at bedtime.     metoprolol 100 MG tablet  Commonly known as:  LOPRESSOR  TAKE 1 TABLET (100 MG TOTAL) BY MOUTH 2 (TWO) TIMES DAILY.     multivitamin with minerals tablet  Take 1 tablet by mouth daily.     nitroGLYCERIN 0.4 MG SL tablet  Commonly known as:  NITROSTAT  Place 0.4 mg under the tongue every 5 (five) minutes as needed for chest pain.     nitroGLYCERIN 0.4 MG SL tablet  Commonly known as:  NITROSTAT  Place 1 tablet (0.4 mg total) under the tongue every 5 (five) minutes as needed for chest pain.     omeprazole 20 MG tablet  Commonly known as:  PRILOSEC OTC  Take 20 mg by mouth daily.     RANEXA 500 MG 12 hr tablet  Generic drug:  ranolazine  Take 1 tablet by mouth 2 (two) times daily.     sulfaSALAzine 500 MG tablet  Commonly known as:  AZULFIDINE  Take 1,000 mg by mouth 2 (two) times daily.     Tiotropium Bromide Monohydrate 2.5 MCG/ACT Aers  Commonly known as:  SPIRIVA RESPIMAT  Inhale 2 puffs into the lungs daily.     tiZANidine 4 MG tablet  Commonly known as:  ZANAFLEX  Take 4 mg by mouth every 8 (eight) hours as needed for muscle spasms.     Vitamin D (Ergocalciferol) 50000 units Caps capsule  Commonly known as:  DRISDOL  Take 50,000 Units by mouth  every Sunday.           Follow-up Information    Follow up with Earnstine Regal, MD. Schedule an appointment as soon as possible for a visit in 3 weeks.   Specialty:  General Surgery   Why:  For wound re-check   Contact information:   [place] [phone]       Earnstine Regal, MD, [facility] Office: [phone]   Signed: Earnstine Regal 10/04/2015, 8:16 AM

## 2015-10-04 NOTE — Telephone Encounter (Signed)
LMOVM reminding pt to send remote transmission.   

## 2015-10-04 NOTE — Progress Notes (Signed)
Light gauze applied to pt's incision.  Discharge instructions reviewed with pt and pt's husband and prescriptions given.  Pt and pt's husband verbalized understanding and had no questions.  Pt discharged in stable condition via wheelchair with husband.  [name]

## 2015-10-06 ENCOUNTER — Encounter: Payer: Self-pay | Admitting: Cardiology

## 2015-10-10 ENCOUNTER — Emergency Department (HOSPITAL_COMMUNITY): Payer: Medicare Other

## 2015-10-10 ENCOUNTER — Inpatient Hospital Stay (HOSPITAL_COMMUNITY)
Admission: EM | Admit: 2015-10-10 | Discharge: 2015-10-13 | DRG: 291 | Disposition: A | Discharge status: Skilled Nursing Facility | Payer: Medicare Other | Attending: Internal Medicine | Admitting: Internal Medicine

## 2015-10-10 ENCOUNTER — Encounter (HOSPITAL_COMMUNITY): Payer: Self-pay | Admitting: Emergency Medicine

## 2015-10-10 DIAGNOSIS — M109 Gout, unspecified: Secondary | ICD-10-CM | POA: Diagnosis present

## 2015-10-10 DIAGNOSIS — Z794 Long term (current) use of insulin: Secondary | ICD-10-CM

## 2015-10-10 DIAGNOSIS — R0600 Dyspnea, unspecified: Secondary | ICD-10-CM | POA: Diagnosis not present

## 2015-10-10 DIAGNOSIS — J449 Chronic obstructive pulmonary disease, unspecified: Secondary | ICD-10-CM | POA: Diagnosis not present

## 2015-10-10 DIAGNOSIS — N183 Chronic kidney disease, stage 3 unspecified: Secondary | ICD-10-CM | POA: Diagnosis present

## 2015-10-10 DIAGNOSIS — E1151 Type 2 diabetes mellitus with diabetic peripheral angiopathy without gangrene: Secondary | ICD-10-CM | POA: Diagnosis present

## 2015-10-10 DIAGNOSIS — E039 Hypothyroidism, unspecified: Secondary | ICD-10-CM | POA: Diagnosis present

## 2015-10-10 DIAGNOSIS — R112 Nausea with vomiting, unspecified: Secondary | ICD-10-CM

## 2015-10-10 DIAGNOSIS — T501X5A Adverse effect of loop [high-ceiling] diuretics, initial encounter: Secondary | ICD-10-CM | POA: Diagnosis present

## 2015-10-10 DIAGNOSIS — I110 Hypertensive heart disease with heart failure: Secondary | ICD-10-CM | POA: Diagnosis present

## 2015-10-10 DIAGNOSIS — E210 Primary hyperparathyroidism: Secondary | ICD-10-CM | POA: Diagnosis present

## 2015-10-10 DIAGNOSIS — Z7901 Long term (current) use of anticoagulants: Secondary | ICD-10-CM

## 2015-10-10 DIAGNOSIS — I130 Hypertensive heart and chronic kidney disease with heart failure and stage 1 through stage 4 chronic kidney disease, or unspecified chronic kidney disease: Principal | ICD-10-CM | POA: Diagnosis present

## 2015-10-10 DIAGNOSIS — K219 Gastro-esophageal reflux disease without esophagitis: Secondary | ICD-10-CM | POA: Diagnosis present

## 2015-10-10 DIAGNOSIS — I482 Chronic atrial fibrillation: Secondary | ICD-10-CM | POA: Diagnosis present

## 2015-10-10 DIAGNOSIS — I5043 Acute on chronic combined systolic (congestive) and diastolic (congestive) heart failure: Secondary | ICD-10-CM | POA: Diagnosis not present

## 2015-10-10 DIAGNOSIS — I480 Paroxysmal atrial fibrillation: Secondary | ICD-10-CM | POA: Diagnosis not present

## 2015-10-10 DIAGNOSIS — I2510 Atherosclerotic heart disease of native coronary artery without angina pectoris: Secondary | ICD-10-CM | POA: Diagnosis present

## 2015-10-10 DIAGNOSIS — E440 Moderate protein-calorie malnutrition: Secondary | ICD-10-CM | POA: Diagnosis present

## 2015-10-10 DIAGNOSIS — I429 Cardiomyopathy, unspecified: Secondary | ICD-10-CM | POA: Diagnosis present

## 2015-10-10 DIAGNOSIS — I4891 Unspecified atrial fibrillation: Secondary | ICD-10-CM | POA: Diagnosis present

## 2015-10-10 DIAGNOSIS — G4733 Obstructive sleep apnea (adult) (pediatric): Secondary | ICD-10-CM | POA: Diagnosis present

## 2015-10-10 DIAGNOSIS — Z87891 Personal history of nicotine dependence: Secondary | ICD-10-CM

## 2015-10-10 DIAGNOSIS — Z6841 Body Mass Index (BMI) 40.0 and over, adult: Secondary | ICD-10-CM

## 2015-10-10 DIAGNOSIS — E1121 Type 2 diabetes mellitus with diabetic nephropathy: Secondary | ICD-10-CM | POA: Diagnosis present

## 2015-10-10 DIAGNOSIS — E1122 Type 2 diabetes mellitus with diabetic chronic kidney disease: Secondary | ICD-10-CM | POA: Diagnosis present

## 2015-10-10 DIAGNOSIS — Z79899 Other long term (current) drug therapy: Secondary | ICD-10-CM

## 2015-10-10 DIAGNOSIS — N179 Acute kidney failure, unspecified: Secondary | ICD-10-CM | POA: Diagnosis present

## 2015-10-10 DIAGNOSIS — F329 Major depressive disorder, single episode, unspecified: Secondary | ICD-10-CM | POA: Diagnosis present

## 2015-10-10 DIAGNOSIS — Z950 Presence of cardiac pacemaker: Secondary | ICD-10-CM

## 2015-10-10 DIAGNOSIS — E785 Hyperlipidemia, unspecified: Secondary | ICD-10-CM | POA: Diagnosis present

## 2015-10-10 DIAGNOSIS — Z9981 Dependence on supplemental oxygen: Secondary | ICD-10-CM

## 2015-10-10 DIAGNOSIS — M810 Age-related osteoporosis without current pathological fracture: Secondary | ICD-10-CM | POA: Diagnosis present

## 2015-10-10 LAB — BASIC METABOLIC PANEL
Anion gap: 13 (ref 5–15)
BUN: 27 mg/dL — AB (ref 6–20)
CO2: 25 mmol/L (ref 22–32)
CREATININE: 1.06 mg/dL — AB (ref 0.44–1.00)
Calcium: 6.3 mg/dL — CL (ref 8.9–10.3)
Chloride: 100 mmol/L — ABNORMAL LOW (ref 101–111)
GFR calc Af Amer: 58 mL/min — ABNORMAL LOW (ref >60)
GFR, EST NON AFRICAN AMERICAN: 50 mL/min — AB (ref >60)
GLUCOSE: 157 mg/dL — AB (ref 65–99)
POTASSIUM: 3.5 mmol/L (ref 3.5–5.1)
SODIUM: 138 mmol/L (ref 135–145)

## 2015-10-10 LAB — CBC WITH DIFFERENTIAL/PLATELET
Basophils Absolute: 0 10*3/uL (ref 0.0–0.1)
Basophils Relative: 0 %
EOS ABS: 0 10*3/uL (ref 0.0–0.7)
EOS PCT: 0 %
HCT: 43.9 % (ref 36.0–46.0)
Hemoglobin: 14.4 g/dL (ref 12.0–15.0)
LYMPHS ABS: 0.6 10*3/uL — AB (ref 0.7–4.0)
LYMPHS PCT: 7 %
MCH: 32.6 pg (ref 26.0–34.0)
MCHC: 32.8 g/dL (ref 30.0–36.0)
MCV: 99.3 fL (ref 78.0–100.0)
MONO ABS: 1.7 10*3/uL — AB (ref 0.1–1.0)
MONOS PCT: 19 %
Neutro Abs: 7 10*3/uL (ref 1.7–7.7)
Neutrophils Relative %: 74 %
PLATELETS: 273 10*3/uL (ref 150–400)
RBC: 4.42 MIL/uL (ref 3.87–5.11)
RDW: 14.8 % (ref 11.5–15.5)
WBC: 9.3 10*3/uL (ref 4.0–10.5)

## 2015-10-10 LAB — URINE MICROSCOPIC-ADD ON

## 2015-10-10 LAB — URINALYSIS, ROUTINE W REFLEX MICROSCOPIC
BILIRUBIN URINE: NEGATIVE
GLUCOSE, UA: NEGATIVE mg/dL
HGB URINE DIPSTICK: NEGATIVE
Ketones, ur: NEGATIVE mg/dL
Leukocytes, UA: NEGATIVE
Nitrite: NEGATIVE
Specific Gravity, Urine: 1.014 (ref 1.005–1.030)
pH: 5 (ref 5.0–8.0)

## 2015-10-10 LAB — GLUCOSE, CAPILLARY: GLUCOSE-CAPILLARY: 126 mg/dL — AB (ref 65–99)

## 2015-10-10 LAB — CBG MONITORING, ED: Glucose-Capillary: 149 mg/dL — ABNORMAL HIGH (ref 65–99)

## 2015-10-10 MED ORDER — SULFASALAZINE 500 MG PO TABS
1000.0000 mg | ORAL_TABLET | Freq: Two times a day (BID) | ORAL | Status: DC
Start: 2015-10-10 — End: 2015-10-13
  Administered 2015-10-10 – 2015-10-13 (×6): 1000 mg via ORAL
  Filled 2015-10-10 (×6): qty 2

## 2015-10-10 MED ORDER — RANOLAZINE ER 500 MG PO TB12
500.0000 mg | ORAL_TABLET | Freq: Two times a day (BID) | ORAL | Status: DC
  Administered 2015-10-10 – 2015-10-13 (×6): 500 mg via ORAL
  Filled 2015-10-10 (×6): qty 1

## 2015-10-10 MED ORDER — FUROSEMIDE 10 MG/ML IJ SOLN: 40.0000 mg | Freq: Once | INTRAMUSCULAR | Status: DC

## 2015-10-10 MED ORDER — INSULIN ASPART 100 UNIT/ML ~~LOC~~ SOLN
0.0000 [IU] | Freq: Three times a day (TID) | SUBCUTANEOUS | Status: DC
  Administered 2015-10-11: 2 [IU] via SUBCUTANEOUS
  Administered 2015-10-11 – 2015-10-12 (×3): 1 [IU] via SUBCUTANEOUS

## 2015-10-10 MED ORDER — DIPHENHYDRAMINE-APAP (SLEEP) 25-500 MG PO TABS: 2.0000 | ORAL_TABLET | Freq: Every evening | ORAL | Status: DC | PRN

## 2015-10-10 MED ORDER — METOPROLOL TARTRATE 50 MG PO TABS
100.0000 mg | ORAL_TABLET | Freq: Two times a day (BID) | ORAL | Status: DC
  Administered 2015-10-10 – 2015-10-13 (×6): 100 mg via ORAL
  Filled 2015-10-10 (×6): qty 2

## 2015-10-10 MED ORDER — FUROSEMIDE 10 MG/ML IJ SOLN
60.0000 mg | Freq: Two times a day (BID) | INTRAMUSCULAR | Status: DC
  Administered 2015-10-11 – 2015-10-13 (×5): 60 mg via INTRAVENOUS
  Filled 2015-10-10 (×5): qty 6

## 2015-10-10 MED ORDER — POTASSIUM CHLORIDE CRYS ER 20 MEQ PO TBCR
20.0000 meq | EXTENDED_RELEASE_TABLET | Freq: Two times a day (BID) | ORAL | Status: DC
  Administered 2015-10-10 – 2015-10-13 (×6): 20 meq via ORAL
  Filled 2015-10-10 (×6): qty 1

## 2015-10-10 MED ORDER — LEVOTHYROXINE SODIUM 75 MCG PO TABS
75.0000 ug | ORAL_TABLET | Freq: Every day | ORAL | Status: DC
  Administered 2015-10-11 – 2015-10-13 (×3): 75 ug via ORAL
  Filled 2015-10-10 (×3): qty 1

## 2015-10-10 MED ORDER — LOSARTAN POTASSIUM 50 MG PO TABS
50.0000 mg | ORAL_TABLET | Freq: Every day | ORAL | Status: DC
  Administered 2015-10-10 – 2015-10-12 (×3): 50 mg via ORAL
  Filled 2015-10-10 (×3): qty 1

## 2015-10-10 MED ORDER — SODIUM CHLORIDE 0.9 % IV BOLUS (SEPSIS)
1000.0000 mL | Freq: Once | INTRAVENOUS | Status: AC
  Administered 2015-10-10: 1000 mL via INTRAVENOUS

## 2015-10-10 MED ORDER — HYDROCODONE-ACETAMINOPHEN 7.5-325 MG PO TABS: 1.0000 | ORAL_TABLET | Freq: Two times a day (BID) | ORAL | Status: DC | PRN

## 2015-10-10 MED ORDER — APIXABAN 5 MG PO TABS
5.0000 mg | ORAL_TABLET | Freq: Two times a day (BID) | ORAL | Status: DC
  Administered 2015-10-10 – 2015-10-13 (×6): 5 mg via ORAL
  Filled 2015-10-10 (×6): qty 1

## 2015-10-10 MED ORDER — INSULIN GLARGINE 100 UNIT/ML ~~LOC~~ SOLN
20.0000 [IU] | Freq: Every day | SUBCUTANEOUS | Status: DC
  Administered 2015-10-11 – 2015-10-13 (×3): 20 [IU] via SUBCUTANEOUS
  Filled 2015-10-10 (×3): qty 0.2

## 2015-10-10 MED ORDER — FIBER PO CAPS: 1.0000 | ORAL_CAPSULE | Freq: Every day | ORAL | Status: DC

## 2015-10-10 MED ORDER — NITROGLYCERIN 0.4 MG SL SUBL: 0.4000 mg | SUBLINGUAL_TABLET | SUBLINGUAL | Status: DC | PRN

## 2015-10-10 MED ORDER — SODIUM CHLORIDE 0.9 % IV SOLN
1.0000 g | Freq: Once | INTRAVENOUS | Status: AC
  Administered 2015-10-10: 1 g via INTRAVENOUS
  Filled 2015-10-10: qty 10

## 2015-10-10 MED ORDER — ATORVASTATIN CALCIUM 80 MG PO TABS
80.0000 mg | ORAL_TABLET | Freq: Every day | ORAL | Status: DC
  Administered 2015-10-10 – 2015-10-12 (×3): 80 mg via ORAL
  Filled 2015-10-10 (×3): qty 1

## 2015-10-10 MED ORDER — ACETAMINOPHEN 500 MG PO TABS
500.0000 mg | ORAL_TABLET | Freq: Every evening | ORAL | Status: DC | PRN
  Administered 2015-10-10 – 2015-10-11 (×2): 500 mg via ORAL
  Filled 2015-10-10 (×2): qty 1

## 2015-10-10 MED ORDER — PANTOPRAZOLE SODIUM 40 MG PO TBEC
40.0000 mg | DELAYED_RELEASE_TABLET | Freq: Every day | ORAL | Status: DC
  Administered 2015-10-10 – 2015-10-13 (×4): 40 mg via ORAL
  Filled 2015-10-10 (×6): qty 1

## 2015-10-10 MED ORDER — ENOXAPARIN SODIUM 40 MG/0.4ML ~~LOC~~ SOLN: 40.0000 mg | SUBCUTANEOUS | Status: DC

## 2015-10-10 MED ORDER — DIPHENHYDRAMINE HCL 25 MG PO CAPS
25.0000 mg | ORAL_CAPSULE | Freq: Every evening | ORAL | Status: DC | PRN
  Administered 2015-10-10 – 2015-10-11 (×2): 25 mg via ORAL
  Filled 2015-10-10 (×2): qty 1

## 2015-10-10 MED ORDER — METOPROLOL TARTRATE 1 MG/ML IV SOLN: 5.0000 mg | Freq: Four times a day (QID) | INTRAVENOUS | Status: DC | PRN

## 2015-10-10 MED ORDER — SODIUM CHLORIDE 0.9 % IJ SOLN
3.0000 mL | Freq: Two times a day (BID) | INTRAMUSCULAR | Status: DC
  Administered 2015-10-11 – 2015-10-13 (×5): 3 mL via INTRAVENOUS

## 2015-10-10 MED ORDER — FUROSEMIDE 10 MG/ML IJ SOLN: 60.0000 mg | Freq: Two times a day (BID) | INTRAMUSCULAR | Status: DC

## 2015-10-10 MED ORDER — ONDANSETRON HCL 4 MG/2ML IJ SOLN: 4.0000 mg | Freq: Four times a day (QID) | INTRAMUSCULAR | Status: DC | PRN

## 2015-10-10 MED ORDER — FOLIC ACID 1 MG PO TABS
1.0000 mg | ORAL_TABLET | Freq: Every day | ORAL | Status: DC
  Administered 2015-10-11 – 2015-10-13 (×3): 1 mg via ORAL
  Filled 2015-10-10 (×3): qty 1

## 2015-10-10 MED ORDER — TIZANIDINE HCL 4 MG PO TABS
4.0000 mg | ORAL_TABLET | Freq: Three times a day (TID) | ORAL | Status: DC | PRN
  Filled 2015-10-10: qty 1

## 2015-10-10 MED ORDER — OMEPRAZOLE MAGNESIUM 20 MG PO TBEC: 20.0000 mg | DELAYED_RELEASE_TABLET | Freq: Every day | ORAL | Status: DC

## 2015-10-10 MED ORDER — ALBUTEROL SULFATE (2.5 MG/3ML) 0.083% IN NEBU: 2.5000 mg | INHALATION_SOLUTION | Freq: Four times a day (QID) | RESPIRATORY_TRACT | Status: DC | PRN

## 2015-10-10 MED ORDER — ONDANSETRON HCL 4 MG PO TABS: 4.0000 mg | ORAL_TABLET | Freq: Four times a day (QID) | ORAL | Status: DC | PRN

## 2015-10-10 MED ORDER — NYSTATIN 100000 UNIT/ML MT SUSP
5.0000 mL | Freq: Four times a day (QID) | OROMUCOSAL | Status: DC
  Administered 2015-10-10 – 2015-10-13 (×10): 500000 [IU] via ORAL
  Filled 2015-10-10 (×13): qty 5

## 2015-10-10 MED ORDER — COLCHICINE 0.6 MG PO TABS
0.6000 mg | ORAL_TABLET | ORAL | Status: DC | PRN
  Filled 2015-10-10: qty 2

## 2015-10-10 MED ORDER — VITAMIN D (ERGOCALCIFEROL) 1.25 MG (50000 UNIT) PO CAPS: 50000.0000 [IU] | ORAL_CAPSULE | ORAL | Status: DC

## 2015-10-10 MED ORDER — CALCIUM POLYCARBOPHIL 625 MG PO TABS
625.0000 mg | ORAL_TABLET | Freq: Every day | ORAL | Status: DC
  Administered 2015-10-10 – 2015-10-12 (×3): 625 mg via ORAL
  Filled 2015-10-10 (×3): qty 1

## 2015-10-10 MED ORDER — TIOTROPIUM BROMIDE MONOHYDRATE 18 MCG IN CAPS
18.0000 ug | ORAL_CAPSULE | Freq: Every day | RESPIRATORY_TRACT | Status: DC
  Administered 2015-10-10 – 2015-10-13 (×4): 18 ug via RESPIRATORY_TRACT
  Filled 2015-10-10: qty 5

## 2015-10-10 MED ORDER — ADULT MULTIVITAMIN W/MINERALS CH
1.0000 | ORAL_TABLET | Freq: Every day | ORAL | Status: DC
Start: 2015-10-11 — End: 2015-10-13
  Administered 2015-10-11 – 2015-10-13 (×3): 1 via ORAL
  Filled 2015-10-10 (×3): qty 1

## 2015-10-10 MED ORDER — HYDROCODONE-ACETAMINOPHEN 5-325 MG PO TABS: 1.0000 | ORAL_TABLET | ORAL | Status: DC | PRN

## 2015-10-10 MED ORDER — MAGIC MOUTHWASH
10.0000 mL | Freq: Four times a day (QID) | ORAL | Status: DC
  Administered 2015-10-10 – 2015-10-13 (×10): 10 mL via ORAL
  Filled 2015-10-10 (×13): qty 10

## 2015-10-10 NOTE — Progress Notes (Signed)
Patient was placed on Cardiac Monitor, CMT called and informed this RN that patient is having runs of PVC's. Patient is asymptomatic no complaints of any pain discomfort. Son at bedside stating that machine(telemetry) will not stop beeping because of her having  Frequent on and off PVC's  And its been ongoing on  for years  that's why the patient has pacemaker now. Will monitor and  endorsed accordingly.

## 2015-10-10 NOTE — ED Provider Notes (Signed)
Complains of generalized weakness, vomiting and diarrhea onset this morning also reports increasing leg edema, bilateral over the past few days. Treated by EMS with Zofran IV prior to coming here. On exam alert chronically ill-appearing. HEENT exam uterus memories dry abdomen obese soft nontender extremities with 2+ pretibial pitting edema bilaterally. ED ECG REPORT   Date: 10/10/2015  Rate: 85  Rhythm: atrial fibrillation  QRS Axis: normal  Intervals: normal  ST/T Wave abnormalities: nonspecific T wave changes  Conduction Disutrbances:nonspecific intraventricular conduction delay  Narrative Interpretation:   Old EKG Reviewed: Tracing from 07/05/2015 showed paced rhythm  I have personally reviewed the EKG tracing and agree with the computerized printout as noted. Pt note to be hypocaemic Results for orders placed or performed during the hospital encounter of 10/10/15  CBC with Differential  Result Value Ref Range   WBC 9.3 4.0 - 10.5 K/uL   RBC 4.42 3.87 - 5.11 MIL/uL   Hemoglobin 14.4 12.0 - 15.0 g/dL   HCT 43.9 36.0 - 46.0 %   MCV 99.3 78.0 - 100.0 fL   MCH 32.6 26.0 - 34.0 pg   MCHC 32.8 30.0 - 36.0 g/dL   RDW 14.8 11.5 - 15.5 %   Platelets 273 150 - 400 K/uL   Neutrophils Relative % 74 %   Neutro Abs 7.0 1.7 - 7.7 K/uL   Lymphocytes Relative 7 %   Lymphs Abs 0.6 (L) 0.7 - 4.0 K/uL   Monocytes Relative 19 %   Monocytes Absolute 1.7 (H) 0.1 - 1.0 K/uL   Eosinophils Relative 0 %   Eosinophils Absolute 0.0 0.0 - 0.7 K/uL   Basophils Relative 0 %   Basophils Absolute 0.0 0.0 - 0.1 K/uL  Basic metabolic panel  Result Value Ref Range   Sodium 138 135 - 145 mmol/L   Potassium 3.5 3.5 - 5.1 mmol/L   Chloride 100 (L) 101 - 111 mmol/L   CO2 25 22 - 32 mmol/L   Glucose, Bld 157 (H) 65 - 99 mg/dL   BUN 27 (H) 6 - 20 mg/dL   Creatinine, Ser 1.06 (H) 0.44 - 1.00 mg/dL   Calcium 6.3 (LL) 8.9 - 10.3 mg/dL   GFR calc non Af Amer 50 (L) >60 mL/min   GFR calc Af Amer 58 (L) >60 mL/min    Anion gap 13 5 - 15   Dg Chest 2 View  10/10/2015  CLINICAL DATA:  Shortness of breath and cough.  Initial encounter. EXAM: CHEST  2 VIEW COMPARISON:  PA and lateral chest 03/28/2015 02/15/2015. FINDINGS: Pacing device is again seen. There is cardiomegaly and mild vascular congestion without frank edema. No consolidative process, pneumothorax or effusion. No focal bony abnormality. IMPRESSION: No acute abnormality. Cardiomegaly and pulmonary vascular congestion. Electronically Signed   By: Inge Rise M.D.   On: 10/10/2015 12:51     Orlie Dakin, MD 10/10/15 1513

## 2015-10-10 NOTE — ED Notes (Signed)
Per [facility] pt from home co NVD started x 2 days. Recent partial parathyroid surgery. Also reports weakness. Received 4 mg Zofran IV, 50 ml NS. Alert and oriented x 4, per [facility] pt was unable to take her home meds today due to emesis and nausea. Hx diabetes and HTN.

## 2015-10-10 NOTE — ED Notes (Signed)
Food and soda given to patient

## 2015-10-10 NOTE — ED Provider Notes (Signed)
CSN: [id number]     Arrival date & time 10/10/15  1055 History   First MD Initiated Contact with Patient 10/10/15 1110     Chief Complaint  Patient presents with  . Emesis   HPI  Ms. Foyle is a 75 year old female with PMHx including CHF, obesity, a fib on eliquis, COPD, pacemaker for tachy-brady syndrome and DM presenting with nausea, vomiting and diarrhea. She reports onset of symptoms this morning. She states that she has been nauseous since yesterday but began vomiting this morning. She also has one episode of diarrhea. She denies blood in the vomit or stool. She does note that she is taking stool softeners and most recently took 1 last evening. She denies abdominal pain. She received zofran from EMS which she reports improved her nausea. She had parathyroidectomy on January 3 and was discharged the next day. She reports feeling good for a few days but then had increasing generalized weakness over the past few days. She states "I just can no longer take care of myself, they discharged me from the hospital too soon". She states that she has been so weak that she cannot walk to her kitchen and make herself a meal. She does live alone. She denies falls or loss of consciousness. Her son-in-law is at bedside and notes that she has also been coughing more frequently for the past week. She states this is a dry cough without sputum production. Denies fevers, chills, headaches, dizziness, syncope, URI symptoms, chest pain, SOB, abdominal pain, dysuria, increased frequency, flank pain or myalgias. She was exposed to a sick relative yesterday who had nausea.   Past Medical History  Diagnosis Date  . Nonischemic cardiomyopathy ([place])     history of,  EF 20-25% at left heart cath in 06/2007; echo 2011 had normal LV function  . Chronic diastolic heart failure ([place])     Echo 06/2010: Moderate LVH, EF 55-65%, normal wall motion, mild MR, moderate to severe LAE, mild RAE.   . Morbid obesity ([place])   . Hyperlipidemia    . Tachy-brady syndrome ([place])     status post implant of a medtronic dual-chamber pacemaker in 2001.  explanted in 2010 -- [place] pacemaker in 2010.  Marland Kitchen Chronic obstructive pulmonary disease ([place])   . Osteoporosis   . Depression   . GERD (gastroesophageal reflux disease)   . Crohn's disease ([place])   . Hypothyroidism     treated  . Seasonal allergies   . Pacemaker     Medtronic  . Atrial fibrillation ([place])     Status post pulmonary vein isolation 2009 at [facility]  . CAD (coronary artery disease)     LHC 05/2007: pLAD 70-80%, pRCA 40%, EF 20-25%. LAD lesion treated medically.   . Coronary artery disease 06/15/2012  . Atrial fibrillation ([place]) 12/18/2007    Annotation: refractory Qualifier: Diagnosis of  By: Doy Mince LPN, Megan    . Morbid obesity with BMI of 40.0-44.9, adult ([place]) 06/22/2012  . Diabetes mellitus ([place]) 06/22/2012  . Diabetes mellitus without complication ([place])   . COPD (chronic obstructive pulmonary disease) ([place])   . Gout   . Dysrhythmia     atrial fib  . Peripheral vascular disease ([facility]) 15    rt arm clots  . Obstructive sleep apnea     continuous positive airway pressure not using at present  . Shortness of breath dyspnea     exersion  . CHF (congestive heart failure) ([place])   .  On home oxygen therapy     "2L at night" (10/03/2015)   Past Surgical History  Procedure Laterality Date  . Pulmonary vein isolation  05/12/2008    RFCA atrial fibrillation  . Post ablation pseudoaneurysm      at A fib ablation  . Pacemaker insertion  2010    medtronic ADAPTA   . Tonsillectomy    . Embolectomy Right 08/16/2014    Procedure: EMBOLECTOMY- RIGHT BRACHIAL ARTERY;  Surgeon: Serafina Mitchell, MD;  Location: [facility] OR;  Service: Vascular;  Laterality: Right;  . Cardiac catheterization  08/2000    noncritical disease mid RCA, EF preserved  . Cardiac catheterization  05/29/2007    noncritical disease, EF 20-25%  . Cataract extraction w/ intraocular lens   implant, bilateral Bilateral 2014-2016  . Parathyroidectomy Right 01/30/2015    Procedure: PARATHYROIDECTOMY;  Surgeon: Armandina Gemma, MD;  Location: [place];  Service: General;  Laterality: Right;  . Parathyroidectomy Left 10/03/2015    Left inferior parathyroidectomy w/neck exploration  . Parathyroidectomy Left 10/03/2015    Procedure: LEFT PARATHYROID EXPLORATION AND  PARATHYROIDECTOMY;  Surgeon: Armandina Gemma, MD;  Location: [facility] OR;  Service: General;  Laterality: Left;   Family History  Problem Relation Age of Onset  . Breast cancer Mother   . Heart disease Father   . Emphysema Father    Social History  Substance Use Topics  . Smoking status: Former Smoker -- 3.00 packs/day for 32 years    Quit date: 09/30/1986  . Smokeless tobacco: Former Systems developer    Quit date: 08/15/1987  . Alcohol Use: No   OB History    Gravida Para Term Preterm AB TAB SAB Ectopic Multiple Living   0 0 0 0 0 0 0 0       Review of Systems  Respiratory: Positive for cough.   Gastrointestinal: Positive for nausea, vomiting and diarrhea.  Neurological: Positive for weakness.  All other systems reviewed and are negative.     Allergies  Review of patient's allergies indicates no known allergies.  Home Medications   Prior to Admission medications   Medication Sig Start Date End Date Taking? Authorizing Provider  albuterol (PROVENTIL HFA;VENTOLIN HFA) 108 (90 BASE) MCG/ACT inhaler Inhale 2 puffs into the lungs every 6 (six) hours as needed for wheezing or shortness of breath.   Yes Historical Provider, MD  atorvastatin (LIPITOR) 80 MG tablet Take 80 mg by mouth at bedtime.     Yes Historical Provider, MD  colchicine 0.6 MG tablet Take 1-2 tablets by mouth daily as needed (gout). TAKE 2 TABLETS BY MOUTH AS ONE DOSE, THEN 1 TABLET IN 1 HOUR FOR ACUTE GOUT EPISODE 08/09/15  Yes Historical Provider, MD  diphenhydramine-acetaminophen (TYLENOL PM) 25-500 MG TABS tablet Take 2 tablets by mouth at bedtime as needed (sleep/pain).    Yes Historical Provider, MD  ELIQUIS 5 MG TABS tablet TAKE 1 TABLET BY MOUTH TWICE A DAY 08/21/15  Yes Deboraha Sprang, MD  Fiber CAPS Take 1 tablet by mouth at bedtime.    Yes Historical Provider, MD  Fluticasone-Salmeterol (ADVAIR) 250-50 MCG/DOSE AEPB Inhale 1 puff into the lungs every 12 (twelve) hours. 01/17/15  Yes Kathee [place], MD  folic acid (FOLVITE) 1 MG tablet Take 1 mg by mouth daily. 06/01/15  Yes Historical Provider, MD  furosemide (LASIX) 40 MG tablet TAKE 1 TABLET BY MOUTH EVERY MORNING AND TAKE 2 TABLETS EVERY EVENING Patient taking differently: TAKE 80 MG BY MOUTH TWICE A DAY 09/15/15  Yes Deboraha Sprang, MD  HYDROcodone-acetaminophen ([place]) 7.5-325 MG tablet Take 1 tablet by mouth 2 (two) times daily as needed for moderate pain or severe pain.  09/13/15  Yes Historical Provider, MD  HYDROcodone-acetaminophen (NORCO/VICODIN) 5-325 MG tablet Take 1-2 tablets by mouth every 4 (four) hours as needed for moderate pain. 10/04/15  Yes Armandina Gemma, MD  insulin glargine (LANTUS) 100 UNIT/ML injection Inject 0.2 mLs (20 Units total) into the skin every morning. Patient taking differently: Inject 10 Units into the skin every morning.  03/30/15  Yes Shanker Kristeen Mans, MD  KLOR-CON M20 20 MEQ tablet TAKE 1 TABLET BY MOUTH TWICE DAILY 04/13/15  Yes Deboraha Sprang, MD  levothyroxine (SYNTHROID, LEVOTHROID) 75 MCG tablet Take 75 mcg by mouth daily. 01/20/15  Yes Historical Provider, MD  losartan (COZAAR) 50 MG tablet Take 50 mg by mouth at bedtime.  01/16/15  Yes Historical Provider, MD  metoprolol (LOPRESSOR) 100 MG tablet TAKE 1 TABLET (100 MG TOTAL) BY MOUTH 2 (TWO) TIMES DAILY. 12/23/14  Yes Deboraha Sprang, MD  Multiple Vitamins-Minerals (MULTIVITAMIN WITH MINERALS) tablet Take 1 tablet by mouth daily.   Yes Historical Provider, MD  nitroGLYCERIN (NITROSTAT) 0.4 MG SL tablet Place 1 tablet (0.4 mg total) under the tongue every 5 (five) minutes as needed for chest pain. 07/07/14 10/10/15 Yes Deboraha Sprang, MD  omeprazole (PRILOSEC OTC) 20 MG tablet Take 20 mg by mouth daily.     Yes Historical Provider, MD  RANEXA 500 MG 12 hr tablet Take 1 tablet by mouth 2 (two) times daily. 03/14/15  Yes Historical Provider, MD  sulfaSALAzine (AZULFIDINE) 500 MG tablet Take 1,000 mg by mouth 2 (two) times daily. 03/16/15  Yes Historical Provider, MD  Tiotropium Bromide Monohydrate (SPIRIVA RESPIMAT) 2.5 MCG/ACT AERS Inhale 2 puffs into the lungs daily. 03/16/15  Yes Collene Gobble, MD  tiZANidine (ZANAFLEX) 4 MG tablet Take 4 mg by mouth every 8 (eight) hours as needed for muscle spasms.  09/13/15  Yes Historical Provider, MD  Vitamin D, Ergocalciferol, (DRISDOL) 50000 units CAPS capsule Take 50,000 Units by mouth every Sunday. 07/28/15  Yes Historical Provider, MD  ipratropium-albuterol (DUONEB) 0.5-2.5 (3) MG/3ML SOLN Take 3 mLs by nebulization every 6 (six) hours as needed (wheezing and or shortness of breath). 02/18/15   Nishant Dhungel, MD   BP 117/92 mmHg  Pulse 85  Temp(Src) 98.9 F (37.2 C) (Oral)  Resp 18  Ht 5\' 10"  (1.778 m)  Wt 122.471 kg  BMI 38.74 kg/m2  SpO2 98% Physical Exam  Constitutional: She is oriented to person, place, and time. She appears well-developed and well-nourished. No distress.  Chronically ill appearing obese female  HENT:  Head: Normocephalic and atraumatic.  Mouth/Throat: Oropharynx is clear and moist. Mucous membranes are dry.  Eyes: Conjunctivae and EOM are normal. Right eye exhibits no discharge. Left eye exhibits no discharge. No scleral icterus.  Neck: Normal range of motion. Neck supple.  Well healing surgical scar over anterior neck  Cardiovascular: Normal rate, regular rhythm and intact distal pulses.   Pretibial pitting edema b/l  Pulmonary/Chest: Effort normal and breath sounds normal. No respiratory distress. She has no wheezes. She has no rales.  Abdominal: Soft. There is no tenderness. There is no rebound and no guarding.  Musculoskeletal: Normal range  of motion.  Neurological: She is alert and oriented to person, place, and time. No cranial nerve deficit. Coordination normal.  5/5 strength in all major muscle groups. Sensation to light touch  intact throughout  Skin: Skin is warm and dry.  Psychiatric: Her mood appears anxious.  Poor eye contact. Pt is extremely anxious about prospect of "taking care of myself alone"  Nursing note and vitals reviewed.   ED Course  Procedures (including critical care time) Labs Review Labs Reviewed  CBC WITH DIFFERENTIAL/PLATELET - Abnormal; Notable for the following:    Lymphs Abs 0.6 (*)    Monocytes Absolute 1.7 (*)    All other components within normal limits  BASIC METABOLIC PANEL - Abnormal; Notable for the following:    Chloride 100 (*)    Glucose, Bld 157 (*)    BUN 27 (*)    Creatinine, Ser 1.06 (*)    Calcium 6.3 (*)    GFR calc non Af Amer 50 (*)    GFR calc Af Amer 58 (*)    All other components within normal limits  URINALYSIS, ROUTINE W REFLEX MICROSCOPIC (NOT AT [facility]) - Abnormal; Notable for the following:    Protein, ur >300 (*)    All other components within normal limits  URINE MICROSCOPIC-ADD ON - Abnormal; Notable for the following:    Squamous Epithelial / LPF 0-5 (*)    Bacteria, UA RARE (*)    Casts HYALINE CASTS (*)    All other components within normal limits  CBG MONITORING, ED    Imaging Review Dg Chest 2 View  10/10/2015  CLINICAL DATA:  Shortness of breath and cough.  Initial encounter. EXAM: CHEST  2 VIEW COMPARISON:  PA and lateral chest 03/28/2015 02/15/2015. FINDINGS: Pacing device is again seen. There is cardiomegaly and mild vascular congestion without frank edema. No consolidative process, pneumothorax or effusion. No focal bony abnormality. IMPRESSION: No acute abnormality. Cardiomegaly and pulmonary vascular congestion. Electronically Signed   By: Inge Rise M.D.   On: 10/10/2015 12:51   I have personally reviewed and evaluated these images and lab  results as part of my medical decision-making.   EKG Interpretation None      MDM   Final diagnoses:  Hypocalcemia  Non-intractable vomiting with nausea, vomiting of unspecified type   75 year old female presenting with nausea, vomiting and diarrhea x 1 day. Also c/o generalized weakness for the past few days. Pt is 1 week post op from thyroidectomy. VSS. Pt is chronically ill appearing. Non-focal neuro exam. Abdomen is soft, non-tender without peritoneal signs. Pretibial edema. Calcium 6.3. Repleted in ED with calcium gluconate. Consulted hospitalist for admission for further evaluation of hypocalcemia and gentle rehydration.      Josephina Gip, PA-C 10/10/15 [place], MD 10/11/15 [phone]

## 2015-10-10 NOTE — ED Notes (Signed)
Bed: WA06 Expected date:  Expected time:  Means of arrival:  Comments: Ems- n/v/d

## 2015-10-10 NOTE — H&P (Signed)
[facility] History and Physical  [name] [id number] DOB: [birth year] DOA: 10/10/2015  Referring physician: ED physician, Dr. Ashok Cordia  PCP: [name], MD   Chief Complaint: fatigue and dyspnea   HPI: Pt is 75 yo female with known atrial fibrillation on Eliquis, chronic diastolic CHF on lasix 80 mg BID PO, primary HPTH and s/p left inferior parathyroidectomy by Dr. Harlow Asa January 3rd, 2017, now presented to [facility] ED with main concern of several days duration of progressive fatigue and muscle aches and twitches, dyspnea with exertion and occasionally at rest. Pt denies fevers, chills, no chest pain or abd concerns, no numbness or tingling. Pt reports chronic LE swelling, weight at baseline ~ 270 lbs. Pt denies any specific focal neurological symptoms, no headaches or visual changes.  In ED, pt noted to be hemodynamically stable, VSS except HR up to 115 - 120's, blood work notable for calcium 6.3, Cr 1.06. CXR worrisome for pulmonary vascular congestion.  TRH asked to admit for further evaluation.   Assessment and Plan:  Active Problems:   Acute on chronic combined systolic and diastolic CHF, NYHA class 3 ([place]) - ECHO in 2015 with EF 45% and has normalized to 55% in May 2016  - admit to telemetry unit - place on Lasix 60 mg IV BID - stop IVF as pt with signs of volume overload, crackles on lung exam and LE edema, JVD + - weight on admission 270 lbs, monitor daily weights, strict I/O    Atrial fibrillation ([place]) with RVR - on Eliquis, continue here - continue metoprolol as per home medical regimen     Hypocalcemia - s/p parathyroidectomy - supplement and repeat BMP in AM    Morbid obesity  - Body mass index is 38.88 kg/(m^2) with underlying risk of HTN, HLD, DM    Chronic kidney disease, stage III (moderate) - Cr at baseline  - BMP in AM    Essential (primary) hypertension - continue home medical regimen     COPD, moderate (HCC) - no wheezing - BD as needed  per home medical regimen     Hyperparathyroidism, primary ([place]) - s/p parathyroidectomy, post op day #7    Type 2 diabetes mellitus with diabetic nephropathy ([place]) - continue home medical regimen and add SSI for better control   Radiological Exams on Admission: Dg Chest 2 View 10/10/2015  No acute abnormality. Cardiomegaly and pulmonary vascular congestion.   Code Status: Full Family Communication: Pt and son at bedside Disposition Plan: Admit for further evaluation    [facility] [id number]   Review of Systems:  Constitutional: Negative for diaphoresis.  HENT: Negative for hearing loss, ear pain, nosebleeds, congestion, sore throat, neck pain, tinnitus and ear discharge.   Eyes: Negative for blurred vision, double vision, photophobia, pain, discharge and redness.  Respiratory: Negative for wheezing and stridor.   Cardiovascular: Negative for palpitations, orthopnea, claudication.  Gastrointestinal: Negative for nausea, vomiting and abdominal pain. Negative for heartburn, constipation, blood in stool and melena.  Genitourinary: Negative for dysuria, urgency, frequency, hematuria and flank pain.  Musculoskeletal: Negative for myalgias, back pain, joint pain and falls.  Skin: Negative for itching and rash.  Neurological: Negative for dizziness and weakness.  Endo/Heme/Allergies: Negative for environmental allergies and polydipsia. Does not bruise/bleed easily.  Psychiatric/Behavioral: Negative for suicidal ideas. The patient is not nervous/anxious.      Past Medical History  Diagnosis Date  . Nonischemic cardiomyopathy (HCC)     history of,  EF 20-25% at left heart cath  in 06/2007; echo 2011 had normal LV function  . Chronic diastolic heart failure ([place])     Echo 06/2010: Moderate LVH, EF 55-65%, normal wall motion, mild MR, moderate to severe LAE, mild RAE.   . Morbid obesity ([place])   . Hyperlipidemia   . Tachy-brady syndrome ([place])     status post implant of a medtronic  dual-chamber pacemaker in 2001.  explanted in 2010 -- [place] pacemaker in 2010.  Marland Kitchen Chronic obstructive pulmonary disease ([place])   . Osteoporosis   . Depression   . GERD (gastroesophageal reflux disease)   . Crohn's disease ([place])   . Hypothyroidism     treated  . Seasonal allergies   . Pacemaker     Medtronic  . Atrial fibrillation ([place])     Status post pulmonary vein isolation 2009 at [facility]  . CAD (coronary artery disease)     LHC 05/2007: pLAD 70-80%, pRCA 40%, EF 20-25%. LAD lesion treated medically.   . Coronary artery disease 06/15/2012  . Atrial fibrillation ([place]) 12/18/2007    Annotation: refractory Qualifier: Diagnosis of  By: Doy Mince LPN, Megan    . Morbid obesity with BMI of 40.0-44.9, adult ([place]) 06/22/2012  . Diabetes mellitus ([place]) 06/22/2012  . Diabetes mellitus without complication ([place])   . COPD (chronic obstructive pulmonary disease) ([place])   . Gout   . Dysrhythmia     atrial fib  . Peripheral vascular disease (HCC) 15    rt arm clots  . Obstructive sleep apnea     continuous positive airway pressure not using at present  . Shortness of breath dyspnea     exersion  . CHF (congestive heart failure) ([place])   . On home oxygen therapy     "2L at night" (10/03/2015)    Past Surgical History  Procedure Laterality Date  . Pulmonary vein isolation  05/12/2008    RFCA atrial fibrillation  . Post ablation pseudoaneurysm      at A fib ablation  . Pacemaker insertion  2010    medtronic ADAPTA   . Tonsillectomy    . Embolectomy Right 08/16/2014    Procedure: EMBOLECTOMY- RIGHT BRACHIAL ARTERY;  Surgeon: Serafina Mitchell, MD;  Location: [facility] OR;  Service: Vascular;  Laterality: Right;  . Cardiac catheterization  08/2000    noncritical disease mid RCA, EF preserved  . Cardiac catheterization  05/29/2007    noncritical disease, EF 20-25%  . Cataract extraction w/ intraocular lens  implant, bilateral Bilateral 2014-2016  . Parathyroidectomy Right  01/30/2015    Procedure: PARATHYROIDECTOMY;  Surgeon: Armandina Gemma, MD;  Location: [facility];  Service: General;  Laterality: Right;  . Parathyroidectomy Left 10/03/2015    Left inferior parathyroidectomy w/neck exploration  . Parathyroidectomy Left 10/03/2015    Procedure: LEFT PARATHYROID EXPLORATION AND  PARATHYROIDECTOMY;  Surgeon: Armandina Gemma, MD;  Location: [facility];  Service: General;  Laterality: Left;    Social History:  reports that she quit smoking about 29 years ago. She quit smokeless tobacco use about 28 years ago. She reports that she does not drink alcohol or use illicit drugs.  No Known Allergies  Family History  Problem Relation Age of Onset  . Breast cancer Mother   . Heart disease Father   . Emphysema Father     Medication Sig  albuterol (PROVENTIL HFA;VENTOLIN HFA) 108 (90 BASE) MCG/ACT inhaler Inhale 2 puffs into the lungs every 6 (six) hours as needed for wheezing or shortness of breath.  atorvastatin (  LIPITOR) 80 MG tablet Take 80 mg by mouth at bedtime.    colchicine 0.6 MG tablet Take 1-2 tablets by mouth daily as needed (gout). TAKE 2 TABLETS BY MOUTH AS ONE DOSE, THEN 1 TABLET IN 1 HOUR FOR ACUTE GOUT EPISODE  ELIQUIS 5 MG TABS tablet TAKE 1 TABLET BY MOUTH TWICE A DAY  furosemide (LASIX) 40 MG tablet TAKE 1 TABLET BY MOUTH EVERY MORNING AND TAKE 2 TABLETS EVERY EVENING Patient taking differently: TAKE 80 MG BY MOUTH TWICE A DAY  HYDROcodone-acetaminophen (NORCO) 7.5-325 MG tablet Take 1 tablet by mouth 2 (two) times daily as needed for moderate pain or severe pain.   insulin glargine (LANTUS) 100 UNIT/ML injection Inject 0.2 mLs (20 Units total) into the skin every morning. Patient taking differently: Inject 10 Units into the skin every morning.   KLOR-CON M20 20 MEQ tablet TAKE 1 TABLET BY MOUTH TWICE DAILY  levothyroxine (SYNTHROID, LEVOTHROID) 75 MCG tablet Take 75 mcg by mouth daily.  losartan (COZAAR) 50 MG tablet Take 50 mg by mouth at bedtime.   metoprolol  (LOPRESSOR) 100 MG tablet TAKE 1 TABLET (100 MG TOTAL) BY MOUTH 2 (TWO) TIMES DAILY.  omeprazole (PRILOSEC OTC) 20 MG tablet Take 20 mg by mouth daily.    RANEXA 500 MG 12 hr tablet Take 1 tablet by mouth 2 (two) times daily.  sulfaSALAzine (AZULFIDINE) 500 MG tablet Take 1,000 mg by mouth 2 (two) times daily.  Tiotropium Bromide Monohydrate (SPIRIVA RESPIMAT) 2.5 MCG/ACT AERS Inhale 2 puffs into the lungs daily.  ipratropium-albuterol (DUONEB) 0.5-2.5 (3) MG/3ML SOLN Take 3 mLs by nebulization every 6 (six) hours as needed (wheezing and or shortness of breath).    Physical Exam: Filed Vitals:   10/10/15 1109 10/10/15 1114 10/10/15 1117 10/10/15 1327  BP: 103/89   127/80  Pulse: 94   94  Temp: 97.6 F (36.4 C)   98.9 F (37.2 C)  TempSrc: Oral   Oral  Resp: 26   24  Height:   5\' 10"  (1.778 m)   Weight:   122.471 kg (270 lb)   SpO2: 94% 94%  95%    Physical Exam  Constitutional: Appears well-developed and well-nourished. No distress.  HENT: Normocephalic. External right and left ear normal. Oropharynx is clear and moist.  Eyes: Conjunctivae and EOM are normal. PERRLA, no scleral icterus.  Neck: Normal ROM. Neck supple. No JVD. No tracheal deviation. No thyromegaly.  CVS: IRRR, no gallops, no carotid bruit.  Pulmonary: Effort and breath sounds normal, no stridor, diminished breath sounds at bases Abdominal: Soft. BS +,  no distension, tenderness, rebound or guarding.  Musculoskeletal: Normal range of motion. + 1 bilateral LE edema  Lymphadenopathy: No lymphadenopathy noted, cervical, inguinal. Neuro: Alert. Normal reflexes, muscle tone coordination. No cranial nerve deficit. Skin: Skin is warm and dry. No rash noted. Not diaphoretic. No erythema. No pallor.  Psychiatric: Normal mood and affect.   Labs on Admission:  Basic Metabolic Panel:  Recent Labs Lab 10/04/15 0401 10/10/15 1242  NA 136 138  K 4.9 3.5  CL 102 100*  CO2 24 25  GLUCOSE 130* 157*  BUN 22* 27*   CREATININE 1.22* 1.06*  CALCIUM 9.5 6.3*  CBC:  Recent Labs Lab 10/10/15 1242  WBC 9.3  NEUTROABS 7.0  HGB 14.4  HCT 43.9  MCV 99.3  PLT 273   CBG:  Recent Labs Lab 10/04/15 0808  GLUCAP 123*    EKG: atrial fib with random PVC's  If 7PM-7AM, please  contact night-coverage [email or web address] Password [email or web address] 10/10/2015, 4:12 PM

## 2015-10-10 NOTE — ED Notes (Signed)
Ca 6.3  Notified primary nurse

## 2015-10-11 DIAGNOSIS — E440 Moderate protein-calorie malnutrition: Secondary | ICD-10-CM | POA: Diagnosis present

## 2015-10-11 DIAGNOSIS — Z9981 Dependence on supplemental oxygen: Secondary | ICD-10-CM | POA: Diagnosis not present

## 2015-10-11 DIAGNOSIS — J449 Chronic obstructive pulmonary disease, unspecified: Secondary | ICD-10-CM | POA: Diagnosis present

## 2015-10-11 DIAGNOSIS — E1151 Type 2 diabetes mellitus with diabetic peripheral angiopathy without gangrene: Secondary | ICD-10-CM | POA: Diagnosis present

## 2015-10-11 DIAGNOSIS — F329 Major depressive disorder, single episode, unspecified: Secondary | ICD-10-CM | POA: Diagnosis present

## 2015-10-11 DIAGNOSIS — E039 Hypothyroidism, unspecified: Secondary | ICD-10-CM | POA: Diagnosis present

## 2015-10-11 DIAGNOSIS — E1121 Type 2 diabetes mellitus with diabetic nephropathy: Secondary | ICD-10-CM | POA: Diagnosis present

## 2015-10-11 DIAGNOSIS — I429 Cardiomyopathy, unspecified: Secondary | ICD-10-CM | POA: Diagnosis present

## 2015-10-11 DIAGNOSIS — N179 Acute kidney failure, unspecified: Secondary | ICD-10-CM | POA: Diagnosis not present

## 2015-10-11 DIAGNOSIS — E785 Hyperlipidemia, unspecified: Secondary | ICD-10-CM | POA: Diagnosis present

## 2015-10-11 DIAGNOSIS — Z7901 Long term (current) use of anticoagulants: Secondary | ICD-10-CM | POA: Diagnosis not present

## 2015-10-11 DIAGNOSIS — G4733 Obstructive sleep apnea (adult) (pediatric): Secondary | ICD-10-CM | POA: Diagnosis present

## 2015-10-11 DIAGNOSIS — I5043 Acute on chronic combined systolic (congestive) and diastolic (congestive) heart failure: Secondary | ICD-10-CM | POA: Diagnosis not present

## 2015-10-11 DIAGNOSIS — Z6841 Body Mass Index (BMI) 40.0 and over, adult: Secondary | ICD-10-CM | POA: Diagnosis not present

## 2015-10-11 DIAGNOSIS — Z79899 Other long term (current) drug therapy: Secondary | ICD-10-CM | POA: Diagnosis not present

## 2015-10-11 DIAGNOSIS — M810 Age-related osteoporosis without current pathological fracture: Secondary | ICD-10-CM | POA: Diagnosis present

## 2015-10-11 DIAGNOSIS — Z794 Long term (current) use of insulin: Secondary | ICD-10-CM | POA: Diagnosis not present

## 2015-10-11 DIAGNOSIS — K219 Gastro-esophageal reflux disease without esophagitis: Secondary | ICD-10-CM | POA: Diagnosis present

## 2015-10-11 DIAGNOSIS — R0600 Dyspnea, unspecified: Secondary | ICD-10-CM | POA: Diagnosis present

## 2015-10-11 DIAGNOSIS — T501X5A Adverse effect of loop [high-ceiling] diuretics, initial encounter: Secondary | ICD-10-CM | POA: Diagnosis present

## 2015-10-11 DIAGNOSIS — E210 Primary hyperparathyroidism: Secondary | ICD-10-CM | POA: Diagnosis present

## 2015-10-11 DIAGNOSIS — M109 Gout, unspecified: Secondary | ICD-10-CM | POA: Diagnosis present

## 2015-10-11 DIAGNOSIS — I2510 Atherosclerotic heart disease of native coronary artery without angina pectoris: Secondary | ICD-10-CM | POA: Diagnosis present

## 2015-10-11 DIAGNOSIS — N183 Chronic kidney disease, stage 3 (moderate): Secondary | ICD-10-CM | POA: Diagnosis not present

## 2015-10-11 DIAGNOSIS — E1122 Type 2 diabetes mellitus with diabetic chronic kidney disease: Secondary | ICD-10-CM | POA: Diagnosis present

## 2015-10-11 DIAGNOSIS — Z87891 Personal history of nicotine dependence: Secondary | ICD-10-CM | POA: Diagnosis not present

## 2015-10-11 DIAGNOSIS — Z950 Presence of cardiac pacemaker: Secondary | ICD-10-CM | POA: Diagnosis not present

## 2015-10-11 DIAGNOSIS — I130 Hypertensive heart and chronic kidney disease with heart failure and stage 1 through stage 4 chronic kidney disease, or unspecified chronic kidney disease: Secondary | ICD-10-CM | POA: Diagnosis present

## 2015-10-11 DIAGNOSIS — I482 Chronic atrial fibrillation: Secondary | ICD-10-CM | POA: Diagnosis present

## 2015-10-11 LAB — CBC
HEMATOCRIT: 39.3 % (ref 36.0–46.0)
HEMOGLOBIN: 13 g/dL (ref 12.0–15.0)
MCH: 32.6 pg (ref 26.0–34.0)
MCHC: 33.1 g/dL (ref 30.0–36.0)
MCV: 98.5 fL (ref 78.0–100.0)
Platelets: 256 10*3/uL (ref 150–400)
RBC: 3.99 MIL/uL (ref 3.87–5.11)
RDW: 14.8 % (ref 11.5–15.5)
WBC: 7.5 10*3/uL (ref 4.0–10.5)

## 2015-10-11 LAB — URINALYSIS, ROUTINE W REFLEX MICROSCOPIC
Bilirubin Urine: NEGATIVE
Glucose, UA: NEGATIVE mg/dL
Ketones, ur: NEGATIVE mg/dL
Nitrite: NEGATIVE
SPECIFIC GRAVITY, URINE: 1.013 (ref 1.005–1.030)
pH: 5.5 (ref 5.0–8.0)

## 2015-10-11 LAB — GLUCOSE, CAPILLARY
GLUCOSE-CAPILLARY: 95 mg/dL (ref 65–99)
GLUCOSE-CAPILLARY: 173 mg/dL — AB (ref 65–99)
GLUCOSE-CAPILLARY: 154 mg/dL — AB (ref 65–99)
Glucose-Capillary: 123 mg/dL — ABNORMAL HIGH (ref 65–99)

## 2015-10-11 LAB — BASIC METABOLIC PANEL
Anion gap: 12 (ref 5–15)
BUN: 30 mg/dL — ABNORMAL HIGH (ref 6–20)
CALCIUM: 5.8 mg/dL — AB (ref 8.9–10.3)
CHLORIDE: 102 mmol/L (ref 101–111)
CO2: 25 mmol/L (ref 22–32)
Creatinine, Ser: 1.12 mg/dL — ABNORMAL HIGH (ref 0.44–1.00)
GFR calc Af Amer: 54 mL/min — ABNORMAL LOW (ref >60)
GFR, EST NON AFRICAN AMERICAN: 47 mL/min — AB (ref >60)
GLUCOSE: 107 mg/dL — AB (ref 65–99)
Potassium: 3.6 mmol/L (ref 3.5–5.1)
SODIUM: 139 mmol/L (ref 135–145)

## 2015-10-11 LAB — URINE MICROSCOPIC-ADD ON

## 2015-10-11 LAB — PHOSPHORUS: PHOSPHORUS: 5 mg/dL — AB (ref 2.5–4.6)

## 2015-10-11 LAB — MAGNESIUM: Magnesium: 1.2 mg/dL — ABNORMAL LOW (ref 1.7–2.4)

## 2015-10-11 MED ORDER — SODIUM CHLORIDE 0.9 % IV SOLN
1.0000 g | Freq: Once | INTRAVENOUS | Status: AC
  Administered 2015-10-11: 1 g via INTRAVENOUS
  Filled 2015-10-11: qty 10

## 2015-10-11 MED ORDER — CALCIUM CARBONATE 1250 (500 CA) MG PO TABS
1.0000 | ORAL_TABLET | Freq: Three times a day (TID) | ORAL | Status: DC
  Administered 2015-10-11 – 2015-10-13 (×4): 500 mg via ORAL
  Filled 2015-10-11 (×7): qty 1

## 2015-10-11 MED ORDER — MAGNESIUM SULFATE 4 GM/100ML IV SOLN
4.0000 g | Freq: Once | INTRAVENOUS | Status: AC
  Administered 2015-10-11: 4 g via INTRAVENOUS
  Filled 2015-10-11: qty 100

## 2015-10-11 NOTE — Clinical Social Work Placement (Signed)
   CLINICAL SOCIAL WORK PLACEMENT  NOTE  Date:  10/11/2015  Patient Details  Name: [name] MRN: [id number] Date of Birth: Feb 13, 1941  Clinical Social Work is seeking post-discharge placement for this patient at the [place] level of care (*CSW will initial, date and re-position this form in  chart as items are completed):  Yes   Patient/family provided with [place] Work Department's list of facilities offering this level of care within the geographic area requested by the patient (or if unable, by the patient's family).  Yes   Patient/family informed of their freedom to choose among providers that offer the needed level of care, that participate in Medicare, Medicaid or managed care program needed by the patient, have an available bed and are willing to accept the patient.  Yes   Patient/family informed of [place]'s ownership interest in [facility] and [facility], as well as of the fact that they are under no obligation to receive care at these facilities.  PASRR submitted to EDS on       PASRR number received on       Existing PASRR number confirmed on 10/11/15     FL2 transmitted to all facilities in geographic area requested by pt/family on 10/11/15     FL2 transmitted to all facilities within larger geographic area on       Patient informed that his/her managed care company has contracts with or will negotiate with certain facilities, including the following:            Patient/family informed of bed offers received.  Patient chooses bed at       Physician recommends and patient chooses bed at      Patient to be transferred to   on  .  Patient to be transferred to facility by       Patient family notified on   of transfer.  Name of family member notified:        PHYSICIAN       Additional Comment:    _______________________________________________ Standley Brooking, LCSW 10/11/2015, 4:20 PM

## 2015-10-11 NOTE — Progress Notes (Signed)
Initial Nutrition Assessment  DOCUMENTATION CODES:   Obesity unspecified, Non-severe (moderate) malnutrition in context of acute illness/injury  INTERVENTION:  - Continue Carb Modified diet - RD will continue to monitor for needs  NUTRITION DIAGNOSIS:   Increased nutrient needs related to acute illness as evidenced by other (see comment) (ongoing diarrhea with increased fluid needs.).  GOAL:   Patient will meet greater than or equal to 90% of their needs  MONITOR:   PO intake, Weight trends, Labs, Skin, I & O's  REASON FOR ASSESSMENT:   Malnutrition Screening Tool  ASSESSMENT:   75 yo female with known atrial fibrillation on Eliquis, chronic diastolic CHF on lasix 80 mg BID PO, primary HPTH and s/p left inferior parathyroidectomy by Dr. Harlow Asa January 3rd, 2017, now presented to [facility] ED with main concern of several days duration of progressive fatigue and muscle aches and twitches, dyspnea with exertion and occasionally at rest. Pt denies fevers, chills, no chest pain or abd concerns, no numbness or tingling. Pt reports chronic LE swelling, weight at baseline ~ 270 lbs. Pt denies any specific focal neurological symptoms, no headaches or visual changes.  Pt seen for MST. BMI indicates obesity/borderline morbid obesity. Pt states that she ate 50% of omelet and breakfast potatoes for breakfast today and visualized lunch tray with 50-75% completion of soup, chicken salad sandwich, and [place] ice. Pt states that she was d/c'ed from the hospital last Wednesday (1/4) following surgery (partial parathyroidectomy) and that since that time she has had a decreased appetite and poor intakes. Pt states that she was unable to cook at home due to weakness. She states that she began having diarrhea over the weekend. Chart notes indicate that pt was having N/V PTA but pt states that she had one episode of small amount of emesis when being transported by EMS and was given Zofran at that time. Other than  this one episode, pt denies N/V PTA or currently.   She states UBW of 270 lbs which is consistent with CBW. Per chart, pt lost 4 lbs (1.4% body weight) in the past 1 week which is significant for time frame. No muscle or fat wasting noted.   Not meeting needs PTA. Medications reviewed. Labs reviewed; CBGs: 95-149 mg/dL, BUN/creatinine elevated and trending up, Ca: 5.8 mg/dL, GFR: 47, Phos: 5 mg/dL, Mg: 1.2 mg/dL.   Diet Order:  Diet Carb Modified Fluid consistency:: Thin; Room service appropriate?: Yes  Skin:  Wound (see comment) (Neck incision from 10/03/15)  Last BM:  1/11  Height:   Ht Readings from Last 1 Encounters:  10/10/15 5\' 10"  (1.778 m)    Weight:   Wt Readings from Last 1 Encounters:  10/11/15 276 lb 7.3 oz (125.4 kg)    Ideal Body Weight:  68.18 kg (kg)  BMI:  Body mass index is 39.67 kg/(m^2).  Estimated Nutritional Needs:   Kcal:  1800-2000  Protein:  75-85 grams  Fluid:  2.2 L/day  EDUCATION NEEDS:   No education needs identified at this time     Jarome Matin, RD, LDN Inpatient Clinical Dietitian Pager # [phone] After hours/weekend pager # [phone]

## 2015-10-11 NOTE — Progress Notes (Signed)
PT Cancellation Note  Patient Details Name: [name] MRN: [id number] DOB: 1940/11/25   Cancelled Treatment:    Reason Eval/Treat Not Completed: Patient declined, Pt just got up to chair and awaiting breakfast.  Requested PT to return later. Will check back.   Derrisha Foos LUBECK 10/11/2015, 9:02 AM

## 2015-10-11 NOTE — Care Management Note (Signed)
Case Management Note  Patient Details  Name: [name] MRN: [id number] Date of Birth: 02-29-1941  Subjective/Objective:  75 y/o f admitted w/hypocalcemia. From home. PT-recc SNF. CSW following.                  Action/Plan:d/c plan SNF.   Expected Discharge Date:   (unknown)               Expected Discharge Plan:  Skilled Nursing Facility  In-House Referral:  Clinical Social Work  Discharge planning Services  CM Consult  Post Acute Care Choice:    Choice offered to:     DME Arranged:    DME Agency:     HH Arranged:    [place] Agency:     Status of Service:  In process, will continue to follow  Medicare Important Message Given:    Date Medicare IM Given:    Medicare IM give by:    Date Additional Medicare IM Given:    Additional Medicare Important Message give by:     If discussed at [place] of Stay Meetings, dates discussed:    Additional Comments:  Dessa Phi, RN 10/11/2015, 4:21 PM

## 2015-10-11 NOTE — Clinical Social Work Note (Signed)
Clinical Social Work Assessment  Patient Details  Name: [name] MRN: [id number] Date of Birth: 12/09/1940  Date of referral:  10/11/15               Reason for consult:  Facility Placement                Permission sought to share information with:  Chartered certified accountant granted to share information::  Yes, Verbal Permission Granted  Name::        Agency::     Relationship::     Contact Information:     Housing/Transportation Living arrangements for the past 2 months:  Single Family Home Source of Information:  Patient Patient Interpreter Needed:  None Criminal Activity/Legal Involvement Pertinent to Current Situation/Hospitalization:  No - Comment as needed Significant Relationships:  Adult Children, Friend Lives with:  Self Do you feel safe going back to the place where you live?  No Need for family participation in patient care:  No (Coment)  Care giving concerns:  CSW reviewed PT evaluation recommending SNF at discharge.    Social Worker assessment / plan:  CSW spoke with patient at bedside to confirm that she is agreeable with plan for SNF, CSW sent information out to [facility] & will follow-up with bed offers when available.   Employment status:  Retired Nurse, adult PT Recommendations:  [place] / Referral to community resources:  [place]  Patient/Family's Response to care:  Patient informed CSW that she had been to [facility] in the past and would consider returning there but would like to see what other options she has for rehab as well.   Patient/Family's Understanding of and Emotional Response to Diagnosis, Current Treatment, and Prognosis:  Patient inquired why people were having to gown up before entering her room - CSW explained that they were trying to rule out CDiff - patient understands.      Emotional Assessment Appearance:  Appears stated  age Attitude/Demeanor/Rapport:    Affect (typically observed):    Orientation:    Alcohol / Substance use:    Psych involvement (Current and /or in the community):     Discharge Needs  Concerns to be addressed:    Readmission within the last 30 days:    Current discharge risk:    Barriers to Discharge:      Standley Brooking, LCSW 10/11/2015, 4:18 PM

## 2015-10-11 NOTE — NC FL2 (Signed)
[place] LEVEL OF CARE SCREENING TOOL     IDENTIFICATION  Patient Name: [name] Birthdate: 06-20-1941 Sex: female Admission Date (Current Location): 10/10/2015  [facility] and [place] Number:  Herbalist and Address:  [facility],  [place] [address] [place]      Provider Number: [id number]  Attending Physician Name and Address:  Louellen Molder, MD  Relative Name and Phone Number:       Current Level of Care: Hospital Recommended Level of Care: [place] Prior Approval Number:    Date Approved/Denied:   PASRR Number: [id number]  Discharge Plan: SNF    Current Diagnoses: Patient Active Problem List   Diagnosis Date Noted  . Malnutrition of moderate degree 10/11/2015  . Hypocalcemia 10/10/2015  . Acute on chronic combined systolic and diastolic CHF, NYHA class 3 ([place]) 10/10/2015  . COPD, moderate ([place]) 04/25/2015  . Fever 03/28/2015  . Near syncope 03/28/2015  . Uncomplicated asthma [id number]  . Body mass index (BMI) of 40.0-44.9 in adult ([place]) 03/13/2015  . Chronic kidney disease, stage III (moderate) 03/13/2015  . Chronic obstructive pulmonary disease ([place]) 03/13/2015  . Crohn's disease of large intestine without complication ([place]) [id number]  . Gastro-esophageal reflux disease without esophagitis 03/13/2015  . Personal history of infectious and parasitic disease 03/13/2015  . Gout 03/13/2015  . Essential (primary) hypertension 03/13/2015  . Hypothyroidism 03/13/2015  . Low back pain 03/13/2015  . Extreme obesity ([place]) 03/13/2015  . Paroxysmal atrial fibrillation ([place]) 03/13/2015  . Osteopenia 03/13/2015  . Primary hyperparathyroidism ([place]) 03/13/2015  . Secondary polycythemia 03/13/2015  . Sleep apnea 03/13/2015  . Type 2 diabetes mellitus with diabetic nephropathy ([place]) 03/13/2015  . COPD exacerbation ([place]) 02/18/2015  . OSA (obstructive sleep apnea) 02/18/2015  . Pulmonary hypertension due  to sleep-disordered breathing ([place]) 02/18/2015  . Acute on chronic respiratory failure with hypoxia ([place]) 02/18/2015  . Hyperparathyroidism, primary ([place]) 01/30/2015  . Obesity 11/10/2014  . Ischemia of hand 08/15/2014  . Morbid obesity with BMI of 40.0-44.9, adult ([place]) 06/22/2012  . Coronary artery disease 06/15/2012  . Long term current use of anticoagulant therapy 01/11/2011  . CARDIAC PACEMAKER IN SITU-MEDTRONIC ADAPTA L 01/15/2010  . DIASTOLIC HEART FAILURE, CHRONIC 12/22/2008  . DYSLIPIDEMIA 09/05/2008  . OBESITY-MORBID (>100') 09/05/2008  . OTHER OSTEOPOROSIS 09/05/2008  . CROHN'S DISEASE, HX OF 09/05/2008  . COPD with emphysema ([place]) 01/02/2008  . HYPERSOMNIA 12/21/2007  . ANXIETY 12/18/2007  . DEPRESSION 12/18/2007  . Atrial fibrillation ([place]) 12/18/2007    Orientation RESPIRATION BLADDER Height & Weight    Self, Time, Situation, Place  Normal Incontinent 5\' 10"  (177.8 cm) 276 lbs.  BEHAVIORAL SYMPTOMS/MOOD NEUROLOGICAL BOWEL NUTRITION STATUS      Incontinent Diet (Carb Modified)  AMBULATORY STATUS COMMUNICATION OF NEEDS Skin   Limited Assist Verbally Surgical wounds (Incision(Closed)01/03/17Neck)                       Personal Care Assistance Level of Assistance  Bathing, Feeding, Dressing Bathing Assistance: Limited assistance Feeding assistance: Limited assistance Dressing Assistance: Limited assistance     Functional Limitations Info             SPECIAL CARE FACTORS FREQUENCY  PT (By licensed PT), OT (By licensed OT)     PT Frequency: 5 OT Frequency: 5            Contractures      Additional Factors Info  Code Status, Allergies, Isolation  Precautions Code Status Info: Fullcode Allergies Info: NKDA     Isolation Precautions Info: Isolation:  Enteric precautions (UV disinfection)Is (rule out CDiff)     Current Medications (10/11/2015):  This is the current hospital active medication list Current Facility-Administered Medications   Medication Dose Route Frequency Provider Last Rate Last Dose  . diphenhydrAMINE (BENADRYL) capsule 25 mg  25 mg Oral QHS PRN Theodis Blaze, MD   25 mg at 10/10/15 2336   And  . acetaminophen (TYLENOL) tablet 500 mg  500 mg Oral QHS PRN Theodis Blaze, MD   500 mg at 10/10/15 2335  . albuterol (PROVENTIL) (2.5 MG/3ML) 0.083% nebulizer solution 2.5 mg  2.5 mg Inhalation Q6H PRN Theodis Blaze, MD      . apixaban Arne Cleveland) tablet 5 mg  5 mg Oral BID Theodis Blaze, MD   5 mg at 10/11/15 0929  . atorvastatin (LIPITOR) tablet 80 mg  80 mg Oral QHS Theodis Blaze, MD   80 mg at 10/10/15 2257  . colchicine tablet 0.6-1.2 mg  0.6-1.2 mg Oral PRN Theodis Blaze, MD      . folic acid (FOLVITE) tablet 1 mg  1 mg Oral Daily Theodis Blaze, MD   1 mg at 10/11/15 0929  . furosemide (LASIX) injection 60 mg  60 mg Intravenous BID Theodis Blaze, MD   60 mg at 10/11/15 [id number]  . HYDROcodone-acetaminophen (NORCO/VICODIN) 5-325 MG per tablet 1-2 tablet  1-2 tablet Oral Q4H PRN Theodis Blaze, MD      . insulin aspart (novoLOG) injection 0-9 Units  0-9 Units Subcutaneous TID WC Theodis Blaze, MD   2 Units at 10/11/15 1219  . insulin glargine (LANTUS) injection 20 Units  20 Units Subcutaneous Q breakfast Theodis Blaze, MD   20 Units at 10/11/15 [phone]  . levothyroxine (SYNTHROID, LEVOTHROID) tablet 75 mcg  75 mcg Oral QAC breakfast Theodis Blaze, MD   75 mcg at 10/11/15 0824  . losartan (COZAAR) tablet 50 mg  50 mg Oral QHS Theodis Blaze, MD   50 mg at 10/10/15 2256  . magic mouthwash  10 mL Oral QID Theodis Blaze, MD   10 mL at 10/11/15 1215  . metoprolol (LOPRESSOR) injection 5 mg  5 mg Intravenous Q6H PRN Theodis Blaze, MD      . metoprolol (LOPRESSOR) tablet 100 mg  100 mg Oral BID Theodis Blaze, MD   100 mg at 10/11/15 [id number]  . multivitamin with minerals tablet 1 tablet  1 tablet Oral Daily Theodis Blaze, MD   1 tablet at 10/11/15 0929  . nitroGLYCERIN (NITROSTAT) SL tablet 0.4 mg  0.4 mg Sublingual Q5 min PRN Theodis Blaze, MD      . nystatin (MYCOSTATIN) 100000 UNIT/ML suspension 500,000 Units  5 mL Oral QID Theodis Blaze, MD   500,000 Units at 10/11/15 1214  . ondansetron (ZOFRAN) tablet 4 mg  4 mg Oral Q6H PRN Theodis Blaze, MD       Or  . ondansetron [facility]) injection 4 mg  4 mg Intravenous Q6H PRN Theodis Blaze, MD      . pantoprazole (PROTONIX) EC tablet 40 mg  40 mg Oral Daily Theodis Blaze, MD   40 mg at 10/11/15 0929  . polycarbophil (FIBERCON) tablet 625 mg  625 mg Oral QHS Theodis Blaze, MD   625 mg at 10/10/15 2256  . potassium chloride SA (K-DUR,KLOR-CON) CR  tablet 20 mEq  20 mEq Oral BID WC Theodis Blaze, MD   20 mEq at 10/11/15 0824  . ranolazine (RANEXA) 12 hr tablet 500 mg  500 mg Oral BID Theodis Blaze, MD   500 mg at 10/11/15 0929  . sodium chloride 0.9 % injection 3 mL  3 mL Intravenous Q12H Theodis Blaze, MD   3 mL at 10/11/15 1000  . sulfaSALAzine (AZULFIDINE) tablet 1,000 mg  1,000 mg Oral BID Theodis Blaze, MD   1,000 mg at 10/11/15 [id number]  . tiotropium (SPIRIVA) inhalation capsule 18 mcg  18 mcg Inhalation Daily Theodis Blaze, MD   18 mcg at 10/11/15 1000  . tiZANidine (ZANAFLEX) tablet 4 mg  4 mg Oral Q8H PRN Theodis Blaze, MD      . Derrill Memo ON 10/15/2015] Vitamin D (Ergocalciferol) (DRISDOL) capsule 50,000 Units  50,000 Units Oral Q Sun Iskra M Myers, MD         Discharge Medications: Please see discharge summary for a list of discharge medications.  Relevant Imaging Results:  Relevant Lab Results:   Additional Information SSN: [id number]  Standley Brooking, LCSW

## 2015-10-11 NOTE — Progress Notes (Signed)
[facility] PROGRESS NOTE  [name] [id number] DOB: [birth year] DOA: 10/10/2015 PCP: [name], MD  Brief Narrative 75 year old female with history of A. fib on eliquis , chronic diastolic CHF, primary hyper parathyroidism status post left inferior parathyroidectomy by Dr. Arnoldo Morale on 10/03/2015 presented with progressive fatigue with muscle aches and twitching associated with dyspnea on exertion and increased leg swellings. In the ED she was found to have heart rate in the range of 1:15-120s. Blood work showing calcium of 6.3 and chest x-ray worrisome for pulmonary vascular congestion. Admitted for further management.   Assessment/Plan: Acute on chronic, and systolic and diastolic CHF Continue telemetry monitoring. Place on IV Lasix 60 mg twice a day (patient is an 80 mg by mouth Lasix twice a day). Patient reports her normal weight to be around 268 pounds and is at 8 pounds above her baseline. Monitor strict I/O. Ordered Foley catheter placement for proper urine output monitoring. Monitor daily weight. 2-D echo from 01/2015 with EF of 55%. Continue remaining home medications. (Beta blocker, statin, losartan and ranolazine)  A. fib with RVR Currently rate controlled. Continue home dose metoprolol and anticoagulation.  Symptomatic Hypocalcemia Recent parathyroidectomy. Has low calcium along with low magnesium. Being supplemented with IV calcium gluconate and IV magnesium. Will start her on oral calcium supplement. Patient is also on weekly ergocalciferol.  COPD Stable. Continue home medications.  Type 2 diabetes mellitus with diabetic nephropathy, CKG stage III Continue home medications with sliding scale coverage.  Morbid obesity    Code Status: Full code Family Communication: Son at bedside Disposition Plan: PT recommends skilled nursing facility. Possible discharge in 48  hours   Consultants:  None  Procedures:  None  Antibiotics:  None  HPI/Subjective: Seen and examined. Compensable some shortness of breath and leg swellings  Objective: Filed Vitals:   10/11/15 0530 10/11/15 1502  BP: 130/106 118/72  Pulse: 86 88  Temp: 98.2 F (36.8 C) 98.1 F (36.7 C)  Resp: 18 17    Intake/Output Summary (Last 24 hours) at 10/11/15 1635 Last data filed at 10/10/15 2300  Gross per 24 hour  Intake   1110 ml  Output      0 ml  Net   1110 ml   Filed Weights   10/10/15 1117 10/10/15 1837 10/11/15 0500  Weight: 122.471 kg (270 lb) 122.9 kg (270 lb 15.1 oz) 125.4 kg (276 lb 7.3 oz)    Exam:   General:  Elderly female appears fatigued  HEENT: No pallor, surgical site over the neck appears clean, no JVD  Chest: Diminished bibasilar breath sounds  CVS: Normal S1 and S2, no murmurs rub or gallop.   GI: Soft, nondistended, nontender  Musculoskeletal: Warm, 1+ pitting edema bilaterally  CNS: Alert and oriented     Data Reviewed: Basic Metabolic Panel:  Recent Labs Lab 10/10/15 1242 10/11/15 0448  NA 138 139  K 3.5 3.6  CL 100* 102  CO2 25 25  GLUCOSE 157* 107*  BUN 27* 30*  CREATININE 1.06* 1.12*  CALCIUM 6.3* 5.8*  MG  --  1.2*  PHOS  --  5.0*   Liver Function Tests: No results for input(s): AST, ALT, ALKPHOS, BILITOT, PROT, ALBUMIN in the last 168 hours. No results for input(s): LIPASE, AMYLASE in the last 168 hours. No results for input(s): AMMONIA in the last 168 hours. CBC:  Recent Labs Lab 10/10/15 1242 10/11/15 0448  WBC 9.3 7.5  NEUTROABS 7.0  --   HGB 14.4 13.0  HCT 43.9  39.3  MCV 99.3 98.5  PLT 273 256   Cardiac Enzymes: No results for input(s): CKTOTAL, CKMB, CKMBINDEX, TROPONINI in the last 168 hours. BNP (last 3 results)  Recent Labs  02/15/15 1129 03/28/15 1857  BNP 1515.5* 224.0*    ProBNP (last 3 results) No results for input(s): PROBNP in the last 8760 hours.  CBG:  Recent Labs Lab  10/10/15 1749 10/10/15 2148 10/11/15 0808 10/11/15 1207  GLUCAP 149* 126* 95 173*    No results found for this or any previous visit (from the past 240 hour(s)).   Studies: Dg Chest 2 View  10/10/2015  CLINICAL DATA:  Shortness of breath and cough.  Initial encounter. EXAM: CHEST  2 VIEW COMPARISON:  PA and lateral chest 03/28/2015 02/15/2015. FINDINGS: Pacing device is again seen. There is cardiomegaly and mild vascular congestion without frank edema. No consolidative process, pneumothorax or effusion. No focal bony abnormality. IMPRESSION: No acute abnormality. Cardiomegaly and pulmonary vascular congestion. Electronically Signed   By: Inge Rise M.D.   On: 10/10/2015 12:51    Scheduled Meds: . apixaban  5 mg Oral BID  . atorvastatin  80 mg Oral QHS  . calcium carbonate  1 tablet Oral TID WC  . folic acid  1 mg Oral Daily  . furosemide  60 mg Intravenous BID  . insulin aspart  0-9 Units Subcutaneous TID WC  . insulin glargine  20 Units Subcutaneous Q breakfast  . levothyroxine  75 mcg Oral QAC breakfast  . losartan  50 mg Oral QHS  . magic mouthwash  10 mL Oral QID  . metoprolol  100 mg Oral BID  . multivitamin with minerals  1 tablet Oral Daily  . nystatin  5 mL Oral QID  . pantoprazole  40 mg Oral Daily  . polycarbophil  625 mg Oral QHS  . potassium chloride SA  20 mEq Oral BID WC  . ranolazine  500 mg Oral BID  . sodium chloride  3 mL Intravenous Q12H  . sulfaSALAzine  1,000 mg Oral BID  . tiotropium  18 mcg Inhalation Daily  . [START ON 10/15/2015] Vitamin D (Ergocalciferol)  50,000 Units Oral Q Sun   Continuous Infusions:     Time spent: 25 minutes    Louellen Molder  [facility] Pager [phone] If 7PM-7AM, please contact night-coverage at [email or web address], password [facility] 10/11/2015, 4:35 PM  LOS: 1 day

## 2015-10-11 NOTE — Evaluation (Signed)
Physical Therapy Evaluation Patient Details Name: ERIENNE ALEXY MRN: [id number] DOB: 1941/01/16 Today's Date: 10/11/2015   History of Present Illness  Ms. Obarr is a 75 year old female with PMHx including CHF, obesity, a fib on eliquis, COPD, pacemaker for tachy-brady syndrome, pacemaker, gout, COPD, depression, Crohn's disease, and DM presenting with nausea, vomiting and diarrhea.  Pt with generalized weakness at home and decreased ability to ambulate in her home.  Pt did have LEFT PARATHYROID EXPLORATION AND PARATHYROIDECTOMY (Left) on 10/03/15  Clinical Impression  Pt admitted with above diagnosis. Pt currently with functional limitations due to the deficits listed below (see PT Problem List).  Pt will benefit from skilled PT to increase their independence and safety with mobility to allow discharge to the venue listed below.  Pt unable to ambulate at time of eval due to pain in B feet.  Pt unable to clean self after using BSC, requiring B UE support on RW.  Due to her current status would recommend SNF.  Pt has been to SNF before and did not like it, so will continue to assess as her medical condition improves.       Follow Up Recommendations SNF;Supervision for mobility/OOB    Equipment Recommendations  3in1 (PT)    Recommendations for Other Services OT consult     Precautions / Restrictions Precautions Precautions: Fall Precaution Comments: obesity      Mobility  Bed Mobility               General bed mobility comments: Pt up in recliner upon arrival  Transfers Overall transfer level: Needs assistance Equipment used: Rolling walker (2 wheeled) Transfers: Sit to/from [facility] Sit to Stand: Min guard Stand pivot transfers: Min assist       General transfer comment: Pt needing cues for hand placement.  Used RW to take several steps to [facility]. Pt unable to clean self. On SPT back to recliner, pt wanting to stand and just reach for recliner which was too  far away.  Pt needed cues for hand placement an duse of RW.  Pt appeared frustrated with PT,  Ambulation/Gait             General Gait Details: Pt declined due to pain in feet due to swelling  Stairs            Wheelchair Mobility    Modified Rankin (Stroke Patients Only)       Balance Overall balance assessment: Needs assistance         Standing balance support: During functional activity Standing balance-Leahy Scale: Poor Standing balance comment: Pt unable to attempt to wipe self after using BSC.                             Pertinent Vitals/Pain Pain Assessment: 0-10 Pain Score: 5  Pain Location: B feet, swelling, pain only with WB Pain Intervention(s): Limited activity within patient's tolerance;Repositioned    Home Living Family/patient expects to be discharged to:: Private residence Living Arrangements: Alone Available Help at Discharge: Family;Available 24 hours/day Type of Home: House Home Access: Stairs to enter   [employer] of Steps: 1 Home Layout: One level Home Equipment: [place] - 2 wheels;[place] - 4 wheels;Tub bench;Shower seat      Prior Function Level of Independence: Independent with assistive device(s)               Hand Dominance  Extremity/Trunk Assessment   Upper Extremity Assessment: Overall WFL for tasks assessed           Lower Extremity Assessment: Generalized weakness         Communication      Cognition Arousal/Alertness: Awake/alert Behavior During Therapy: Agitated;Flat affect Overall Cognitive Status: Within Functional Limits for tasks assessed                      General Comments      Exercises        Assessment/Plan    PT Assessment Patient needs continued PT services  PT Diagnosis Difficulty walking   PT Problem List Decreased activity tolerance;Decreased balance;Decreased mobility;Decreased knowledge of use of DME;Decreased safety awareness  PT  Treatment Interventions Gait training;Functional mobility training;Therapeutic activities;Therapeutic exercise;Balance training   PT Goals (Current goals can be found in the Care Plan section) Acute Rehab PT Goals Patient Stated Goal: To walk better PT Goal Formulation: With patient Time For Goal Achievement: 10/25/15 Potential to Achieve Goals: Good    Frequency Min 3X/week   Barriers to discharge Decreased caregiver support      Co-evaluation               End of Session Equipment Utilized During Treatment: Gait belt Activity Tolerance: Patient limited by pain Patient left: in chair;with call bell/phone within reach;with chair alarm set;with family/visitor present Nurse Communication: Mobility status;Other (comment) (pain/swelling in B feet)    Functional Assessment Tool Used: clinical judgement and objective findings Functional Limitation: Mobility: Walking and moving around Mobility: Walking and Moving Around Current Status [phone]): At least 1 percent but less than 20 percent impaired, limited or restricted Mobility: Walking and Moving Around Goal Status [phone]): 0 percent impaired, limited or restricted    Time: 1004-1030 PT Time Calculation (min) (ACUTE ONLY): 26 min   Charges:   PT Evaluation $PT Eval Moderate Complexity: 1 Procedure PT Treatments $Therapeutic Activity: 8-22 mins   PT G Codes:   PT G-Codes **NOT FOR INPATIENT CLASS** Functional Assessment Tool Used: clinical judgement and objective findings Functional Limitation: Mobility: Walking and moving around Mobility: Walking and Moving Around Current Status [id number]): At least 1 percent but less than 20 percent impaired, limited or restricted Mobility: Walking and Moving Around Goal Status [phone]): 0 percent impaired, limited or restricted    Jun Osment LUBECK 10/11/2015, 10:51 AM

## 2015-10-12 DIAGNOSIS — N179 Acute kidney failure, unspecified: Secondary | ICD-10-CM

## 2015-10-12 LAB — GLUCOSE, CAPILLARY
GLUCOSE-CAPILLARY: 134 mg/dL — AB (ref 65–99)
GLUCOSE-CAPILLARY: 147 mg/dL — AB (ref 65–99)
Glucose-Capillary: 135 mg/dL — ABNORMAL HIGH (ref 65–99)
Glucose-Capillary: 115 mg/dL — ABNORMAL HIGH (ref 65–99)

## 2015-10-12 LAB — HEPATIC FUNCTION PANEL
ALBUMIN: 3 g/dL — AB (ref 3.5–5.0)
ALT: 19 U/L (ref 14–54)
AST: 37 U/L (ref 15–41)
Alkaline Phosphatase: 113 U/L (ref 38–126)
BILIRUBIN DIRECT: 0.2 mg/dL (ref 0.1–0.5)
BILIRUBIN INDIRECT: 0.6 mg/dL (ref 0.3–0.9)
Total Bilirubin: 0.8 mg/dL (ref 0.3–1.2)
Total Protein: 5.8 g/dL — ABNORMAL LOW (ref 6.5–8.1)

## 2015-10-12 LAB — BASIC METABOLIC PANEL
ANION GAP: 9 (ref 5–15)
BUN: 31 mg/dL — ABNORMAL HIGH (ref 6–20)
CO2: 28 mmol/L (ref 22–32)
Calcium: 6.3 mg/dL — CL (ref 8.9–10.3)
Chloride: 100 mmol/L — ABNORMAL LOW (ref 101–111)
Creatinine, Ser: 1.43 mg/dL — ABNORMAL HIGH (ref 0.44–1.00)
GFR, EST AFRICAN AMERICAN: 40 mL/min — AB (ref >60)
GFR, EST NON AFRICAN AMERICAN: 35 mL/min — AB (ref >60)
GLUCOSE: 128 mg/dL — AB (ref 65–99)
POTASSIUM: 3.8 mmol/L (ref 3.5–5.1)
Sodium: 137 mmol/L (ref 135–145)

## 2015-10-12 LAB — MAGNESIUM: MAGNESIUM: 1.7 mg/dL (ref 1.7–2.4)

## 2015-10-12 MED ORDER — MAGNESIUM SULFATE IN D5W 10-5 MG/ML-% IV SOLN
1.0000 g | Freq: Once | INTRAVENOUS | Status: AC
  Administered 2015-10-12: 1 g via INTRAVENOUS
  Filled 2015-10-12: qty 100

## 2015-10-12 NOTE — Progress Notes (Signed)
Patient had a medium formed/soft BM. Susie RN notified. Not a valid sample per nurse.

## 2015-10-12 NOTE — Progress Notes (Signed)
[facility] PROGRESS NOTE  [name] [id number] DOB: 19-May-1941 DOA: 10/10/2015 PCP: [name], MD  Brief Narrative 75 year old female with history of A. fib on eliquis , chronic diastolic CHF, primary hyper parathyroidism status post left inferior parathyroidectomy by Dr. Arnoldo Morale on 10/03/2015 presented with progressive fatigue with muscle aches and twitching associated with dyspnea on exertion and increased leg swellings. In the ED she was found to have heart rate in the range of 1:15-120s. Blood work showing calcium of 6.3 and chest x-ray worrisome for pulmonary vascular congestion. Admitted for further management.   Assessment/Plan: Acute on chronic combined systolic and diastolic CHF Continue telemetry monitoring. Continue IV Lasix 60 mg twice a day (patient is an 80 mg by mouth Lasix twice a day). Patient reports her normal weight to be around 268 pounds . (was 276 found on presentation)  Monitor strict I/O. Marland Kitchen Foley catheter placement for proper urine output monitoring. Monitor daily weight.( Lost 3 pounds in past 24 hours) 2-D echo from 01/2015 with EF of 55%. Continue remaining home medications. (Beta blocker, statin, losartan and ranolazine)  A. fib with RVR Currently rate controlled. Continue home dose metoprolol and anticoagulation.  Symptomatic Hypocalcemia Recent parathyroidectomy. Corrected calcium of 7.1. Low calcium and magnesium replenished IV. Can started on supplemental oral calcium. Continue weekly vitamin D  . COPD Stable. Continue home medications.  Type 2 diabetes mellitus with diabetic nephropathy, CKG stage III Continue home medications with sliding scale coverage.  Acute kidney injury Mild. Secondary to Lasix. Monitor.  Morbid obesity    Code Status: Full code Family Communication: Son at bedside Disposition Plan: PT recommends skilled nursing facility. Discharge tomorrow if diuresed  adequately.   Consultants:  None  Procedures:  None  Antibiotics:  None  HPI/Subjective: Seen and examined. Dyspnea better.   Objective: Filed Vitals:   10/11/15 2254 10/12/15 0630  BP: 109/73 134/84  Pulse: 97 85  Temp: 97.9 F (36.6 C) 98.5 F (36.9 C)  Resp: 18 18    Intake/Output Summary (Last 24 hours) at 10/12/15 1333 Last data filed at 10/12/15 1227  Gross per 24 hour  Intake      3 ml  Output   1975 ml  Net  -1972 ml   Filed Weights   10/10/15 1837 10/11/15 0500 10/12/15 0630  Weight: 122.9 kg (270 lb 15.1 oz) 125.4 kg (276 lb 7.3 oz) 124.2 kg (273 lb 13 oz)    Exam:   General:  Elderly female not in distress  HEENT: surgical site over the neck appears clean,  Chest: Diminished bibasilar breath sounds  CVS: Normal S1 and S2, no murmurs rub or gallop.   GI: Soft, nondistended, nontender  Musculoskeletal: Warm, trace pitting edema bilaterally  CNS: Alert and oriented     Data Reviewed: Basic Metabolic Panel:  Recent Labs Lab 10/10/15 1242 10/11/15 0448 10/12/15 0500  NA 138 139 137  K 3.5 3.6 3.8  CL 100* 102 100*  CO2 25 25 28   GLUCOSE 157* 107* 128*  BUN 27* 30* 31*  CREATININE 1.06* 1.12* 1.43*  CALCIUM 6.3* 5.8* 6.3*  MG  --  1.2* 1.7  PHOS  --  5.0*  --    Liver Function Tests:  Recent Labs Lab 10/12/15 0500  AST 37  ALT 19  ALKPHOS 113  BILITOT 0.8  PROT 5.8*  ALBUMIN 3.0*   No results for input(s): LIPASE, AMYLASE in the last 168 hours. No results for input(s): AMMONIA in the last 168 hours. CBC:  Recent Labs Lab 10/10/15 1242 10/11/15 0448  WBC 9.3 7.5  NEUTROABS 7.0  --   HGB 14.4 13.0  HCT 43.9 39.3  MCV 99.3 98.5  PLT 273 256   Cardiac Enzymes: No results for input(s): CKTOTAL, CKMB, CKMBINDEX, TROPONINI in the last 168 hours. BNP (last 3 results)  Recent Labs  02/15/15 1129 03/28/15 1857  BNP 1515.5* 224.0*    ProBNP (last 3 results) No results for input(s): PROBNP in the last 8760  hours.  CBG:  Recent Labs Lab 10/11/15 1207 10/11/15 1653 10/11/15 2241 10/12/15 0750 10/12/15 1225  GLUCAP 173* 123* 154* 134* 147*    No results found for this or any previous visit (from the past 240 hour(s)).   Studies: No results found.  Scheduled Meds: . apixaban  5 mg Oral BID  . atorvastatin  80 mg Oral QHS  . calcium carbonate  1 tablet Oral TID WC  . folic acid  1 mg Oral Daily  . furosemide  60 mg Intravenous BID  . insulin aspart  0-9 Units Subcutaneous TID WC  . insulin glargine  20 Units Subcutaneous Q breakfast  . levothyroxine  75 mcg Oral QAC breakfast  . losartan  50 mg Oral QHS  . magic mouthwash  10 mL Oral QID  . metoprolol  100 mg Oral BID  . multivitamin with minerals  1 tablet Oral Daily  . nystatin  5 mL Oral QID  . pantoprazole  40 mg Oral Daily  . polycarbophil  625 mg Oral QHS  . potassium chloride SA  20 mEq Oral BID WC  . ranolazine  500 mg Oral BID  . sodium chloride  3 mL Intravenous Q12H  . sulfaSALAzine  1,000 mg Oral BID  . tiotropium  18 mcg Inhalation Daily  . [START ON 10/15/2015] Vitamin D (Ergocalciferol)  50,000 Units Oral Q Sun   Continuous Infusions:     Time spent: 25 minutes    Louellen Molder  [facility] Pager [phone] If 7PM-7AM, please contact night-coverage at [email or web address], password [facility] 10/12/2015, 1:33 PM  LOS: 2 days

## 2015-10-13 DIAGNOSIS — Z6841 Body Mass Index (BMI) 40.0 and over, adult: Secondary | ICD-10-CM

## 2015-10-13 DIAGNOSIS — E1121 Type 2 diabetes mellitus with diabetic nephropathy: Secondary | ICD-10-CM

## 2015-10-13 DIAGNOSIS — J449 Chronic obstructive pulmonary disease, unspecified: Secondary | ICD-10-CM

## 2015-10-13 DIAGNOSIS — Z794 Long term (current) use of insulin: Secondary | ICD-10-CM

## 2015-10-13 DIAGNOSIS — E440 Moderate protein-calorie malnutrition: Secondary | ICD-10-CM

## 2015-10-13 DIAGNOSIS — E210 Primary hyperparathyroidism: Secondary | ICD-10-CM

## 2015-10-13 DIAGNOSIS — I10 Essential (primary) hypertension: Secondary | ICD-10-CM

## 2015-10-13 LAB — BASIC METABOLIC PANEL
ANION GAP: 13 (ref 5–15)
BUN: 28 mg/dL — ABNORMAL HIGH (ref 6–20)
CHLORIDE: 101 mmol/L (ref 101–111)
CO2: 25 mmol/L (ref 22–32)
Calcium: 6.3 mg/dL — CL (ref 8.9–10.3)
Creatinine, Ser: 1.24 mg/dL — ABNORMAL HIGH (ref 0.44–1.00)
GFR calc non Af Amer: 41 mL/min — ABNORMAL LOW (ref >60)
GFR, EST AFRICAN AMERICAN: 48 mL/min — AB (ref >60)
Glucose, Bld: 185 mg/dL — ABNORMAL HIGH (ref 65–99)
POTASSIUM: 3.8 mmol/L (ref 3.5–5.1)
Sodium: 139 mmol/L (ref 135–145)

## 2015-10-13 LAB — GLUCOSE, CAPILLARY
GLUCOSE-CAPILLARY: 109 mg/dL — AB (ref 65–99)
Glucose-Capillary: 179 mg/dL — ABNORMAL HIGH (ref 65–99)

## 2015-10-13 MED ORDER — FUROSEMIDE 40 MG PO TABS: ORAL_TABLET | ORAL | Status: DC

## 2015-10-13 MED ORDER — HYDROCODONE-ACETAMINOPHEN 5-325 MG PO TABS: 2.0000 | ORAL_TABLET | Freq: Four times a day (QID) | ORAL | Status: DC | PRN

## 2015-10-13 MED ORDER — INSULIN GLARGINE 100 UNIT/ML ~~LOC~~ SOLN: 20.0000 [IU] | SUBCUTANEOUS | Status: DC

## 2015-10-13 MED ORDER — CALCIUM CARBONATE 1250 (500 CA) MG PO TABS: 2.0000 | ORAL_TABLET | Freq: Three times a day (TID) | ORAL | Status: DC

## 2015-10-13 NOTE — Progress Notes (Signed)
Physical Therapy Treatment Patient Details Name: [name] MRN: [id number] DOB: August 27, 1941 Today's Date: 10/13/2015    History of Present Illness [name] is a 75 year old female with PMHx including CHF, obesity, a fib on eliquis, COPD, pacemaker for tachy-brady syndrome, pacemaker, gout, COPD, depression, Crohn's disease, and DM presenting with nausea, vomiting and diarrhea.  Pt with generalized weakness at home and decreased ability to ambulate in her home.  Pt did have LEFT PARATHYROID EXPLORATION AND PARATHYROIDECTOMY (Left) on 10/03/15    PT Comments    Progressing slowly with mobility. Pt begrudgingly agreed to participate this session. Appears generally irritated. Able to take a few steps in room with RW. Pt denied pain initially but then groaned out in pain when she was adjusting herself in bed. Continue to recommend SNF.  Follow Up Recommendations  SNF     Equipment Recommendations       Recommendations for Other Services       Precautions / Restrictions Precautions Precautions: Fall Restrictions Weight Bearing Restrictions: No    Mobility  Bed Mobility Overal bed mobility: Needs Assistance Bed Mobility: Supine to Sit;Sit to Supine     Supine to sit: Min guard Sit to supine: Min guard   General bed mobility comments: Pt tends to throw her body around. Moves quickly. close guard for safety  Transfers Overall transfer level: Needs assistance Equipment used: Rolling walker (2 wheeled) Transfers: Sit to/from Stand Sit to Stand: Min guard         General transfer comment: close guard for safety.   Ambulation/Gait Ambulation/Gait assistance: Min assist Ambulation Distance (Feet): 4 Feet (4 feet fwd then bkwd)   Gait Pattern/deviations: Step-to pattern;Trunk flexed;Decreased stride length     General Gait Details: Assist to stabilize pt and RW, especially when taking steps backward.    Stairs            Wheelchair Mobility    Modified  Rankin (Stroke Patients Only)       Balance           Standing balance support: Bilateral upper extremity supported;During functional activity Standing balance-Leahy Scale: Poor                      Cognition Arousal/Alertness: Awake/alert Behavior During Therapy: Agitated;Flat affect Overall Cognitive Status: Within Functional Limits for tasks assessed                      Exercises General Exercises - Lower Extremity Ankle Circles/Pumps: AROM;Both;5 reps;Supine Quad Sets: AROM;Both;Supine Heel Slides: AROM;Both;5 reps;Supine    General Comments        Pertinent Vitals/Pain Pain Assessment: No/denies pain    Home Living                      Prior Function            PT Goals (current goals can now be found in the care plan section) Progress towards PT goals: Progressing toward goals (slowly. pt is likely able to do more but limits herself)    Frequency  Min 3X/week    PT Plan Current plan remains appropriate    Co-evaluation             End of Session   Activity Tolerance: Patient tolerated treatment well Patient left: in bed;with call bell/phone within reach;with bed alarm set     Time:  -     Charges:  G Codes:      [name], MPT Pager: [phone]

## 2015-10-13 NOTE — Clinical Social Work Placement (Signed)
Patient is set to discharge to [facility] SNF today. Patient aware. Discharge packet given to RN, Susie. Patient's son-in-law to transport to SNF.    Raynaldo Opitz, [facility] Clinical Social Worker cell #: [phone]    CLINICAL SOCIAL WORK PLACEMENT  NOTE  Date:  10/13/2015  Patient Details  Name: [name] MRN: [id number] Date of Birth: [birth year]  Clinical Social Work is seeking post-discharge placement for this patient at the [place] level of care (*CSW will initial, date and re-position this form in  chart as items are completed):  Yes   Patient/family provided with [place] Work Department's list of facilities offering this level of care within the geographic area requested by the patient (or if unable, by the patient's family).  Yes   Patient/family informed of their freedom to choose among providers that offer the needed level of care, that participate in Medicare, Medicaid or managed care program needed by the patient, have an available bed and are willing to accept the patient.  Yes   Patient/family informed of [facility]'s ownership interest in [facility] and [facility], as well as of the fact that they are under no obligation to receive care at these facilities.  PASRR submitted to EDS on       PASRR number received on       Existing PASRR number confirmed on 10/11/15     FL2 transmitted to all facilities in geographic area requested by pt/family on 10/11/15     FL2 transmitted to all facilities within larger geographic area on       Patient informed that his/her managed care company has contracts with or will negotiate with certain facilities, including the following:        Yes   Patient/family informed of bed offers received.  Patient chooses bed at [facility]     Physician recommends and patient chooses bed at      Patient to be transferred to [facility] on 10/13/15.  Patient to be transferred to facility by [facility]     Patient family notified on   of transfer.  Name of family member notified:        PHYSICIAN       Additional Comment:    _______________________________________________ Standley Brooking, LCSW 10/13/2015, 1:25 PM

## 2015-10-13 NOTE — Care Management Important Message (Signed)
Important Message  Patient Details IM Letter given to [name] to present to Patient Name: [name] MRN: [id number] Date of Birth: 29-Apr-1941   Medicare Important Message Given:  Yes    [name] 10/13/2015, 10:38 AMImportant Message  Patient Details  Name: [name] MRN: [id number] Date of Birth: 12/24/1940   Medicare Important Message Given:  Yes    [name] 10/13/2015, 10:37 AM

## 2015-10-13 NOTE — Discharge Summary (Signed)
Physician Discharge Summary  RETINA KUSAK [id number] DOB: 10/24/1940 DOA: 10/10/2015  PCP: [name], MD  Admit date: 10/10/2015 Discharge date: 10/13/2015  Time spent: 35 minutes  Recommendations for Outpatient Follow-up:  1. Discharged to skilled nursing facility. 2. Please check BMET ( including ca, mg, k and renal function) in 3-4 days. Please monitor daily weight.   Discharge Diagnoses:  Principal Problem:   Acute on chronic combined systolic and diastolic CHF, NYHA class 3 (HCC)   Active Problems:   Hypocalcemia   Atrial fibrillation (HCC)   Morbid obesity with BMI of 40.0-44.9, adult (HCC)   Hyperparathyroidism, primary ([place])   Chronic kidney disease, stage III (moderate)   Essential (primary) hypertension   Type 2 diabetes mellitus with diabetic nephropathy (HCC)   COPD, moderate (HCC)   Malnutrition of moderate degree   Hypomagnesemia   Discharge Condition: fair  Diet recommendation: heart healthy/ diabetic  CODE STATUS: Full code   Filed Weights   10/11/15 0500 10/12/15 0630 10/13/15 0457  Weight: 125.4 kg (276 lb 7.3 oz) 124.2 kg (273 lb 13 oz) 123.424 kg (272 lb 1.6 oz)    History of present illness:  Please refer to admission H&P for details, in brief,75 year old female with history of A. fib on eliquis , chronic diastolic CHF, primary hyper parathyroidism status post left inferior parathyroidectomy by Dr. Harlow Asa  on 10/03/2015 presented with progressive fatigue with muscle aches and twitching associated with dyspnea on exertion and increased leg swellings. In the ED she was found to have heart rate in the range of 1:15-120s. Blood work showing calcium of 6.3 and chest x-ray worrisome for pulmonary vascular congestion. Admitted for further management.  Hospital Course:  Acute on chronic combined systolic and diastolic CHF Monitored on telemetry. Placed on IV Lasix 60 mg twice a day (patient is an 80 mg by mouth Lasix twice a day at home). Patient  reports her normal weight to be around 268 pounds . (was 276 found on presentation)  -Has diuresed well with about 4 pound weight loss since admission. Bilateral leg edema has resolved and denies further shortness of breath. 2-D echo from 01/2015 with EF of 55%. -Continue remaining home medications. (Beta blocker, statin, losartan and ranolazine.) -Patient seen by physical therapy and recommends skilled nursing facility. Stable to be discharged today.  A. fib with RVR On presentation secondary to fluid overload and hypocalcemia. rate controlled. Continue home dose metoprolol and anticoagulation.  Symptomatic Hypocalcemia Recent parathyroidectomy. Corrected calcium of 7.1. Low calcium and magnesium replenished IV.  started on supplemental oral calcium (dose increased today). Continue weekly vitamin D. Please monitor calcium and magnesium levels early next week.  Mar[place] Kitchen COPD Stable. Continue home medications.  Type 2 diabetes mellitus with diabetic nephropathy, CKG stage III Continue home insulin. Monitor with sliding silk coverage.  Acute kidney injury Mild. Secondary to IV Lasix. Placed her back on her home Lasix dose. Follow-up as outpatient.  Hypothyroidism Continue Synthroid  Morbid obesity     Family Communication: Discussed with son on 1/12 Disposition Plan: PT recommends skilled nursing facility. Ready for discharge.  Consultants:  None  Procedures:  None  Antibiotics:  None  Discharge Exam: Filed Vitals:   10/12/15 2221 10/13/15 0457  BP: 119/83 126/83  Pulse: 96 86  Temp: 98.4 F (36.9 C) 98.2 F (36.8 C)  Resp: 18 20    General: Elderly female not in distress  HEENT: surgical site over the neck appears clean,  Chest: Diminished bibasilar breath sounds  CVS: Normal  S1 and S2, no murmurs rub or gallop.   GI: Soft, nondistended, nontender  Musculoskeletal: Warm, bilateral pitting edema resolved  CNS: Alert and oriented   Discharge  Instructions    Current Discharge Medication List    START taking these medications   Details  calcium carbonate (OS-CAL - DOSED IN MG OF ELEMENTAL CALCIUM) 1250 (500 Ca) MG tablet Take 2 tablets (1,000 mg of elemental calcium total) by mouth 3 (three) times daily with meals. Qty: 90 tablet, Refills: 0      CONTINUE these medications which have CHANGED   Details  furosemide (LASIX) 40 MG tablet TAKE 80 MG BY MOUTH TWICE A DAY Qty: 270 tablet, Refills: 2    HYDROcodone-acetaminophen (NORCO/VICODIN) 5-325 MG tablet Take 2 tablets by mouth every 6 (six) hours as needed for moderate pain. Qty: 20 tablet, Refills: 0    insulin glargine (LANTUS) 100 UNIT/ML injection Inject 0.2 mLs (20 Units total) into the skin every morning. Qty: 10 mL, Refills: 11      CONTINUE these medications which have NOT CHANGED   Details  albuterol (PROVENTIL HFA;VENTOLIN HFA) 108 (90 BASE) MCG/ACT inhaler Inhale 2 puffs into the lungs every 6 (six) hours as needed for wheezing or shortness of breath.    atorvastatin (LIPITOR) 80 MG tablet Take 80 mg by mouth at bedtime.      colchicine 0.6 MG tablet Take 1-2 tablets by mouth daily as needed (gout). TAKE 2 TABLETS BY MOUTH AS ONE DOSE, THEN 1 TABLET IN 1 HOUR FOR ACUTE GOUT EPISODE Refills: 2    diphenhydramine-acetaminophen (TYLENOL PM) 25-500 MG TABS tablet Take 2 tablets by mouth at bedtime as needed (sleep/pain).    ELIQUIS 5 MG TABS tablet TAKE 1 TABLET BY MOUTH TWICE A DAY Qty: 60 tablet, Refills: 5    Fiber CAPS Take 1 tablet by mouth at bedtime.     Fluticasone-Salmeterol (ADVAIR) 250-50 MCG/DOSE AEPB Inhale 1 puff into the lungs every 12 (twelve) hours. Qty: 60 each, Refills: 6    folic acid (FOLVITE) 1 MG tablet Take 1 mg by mouth daily. Refills: 12    KLOR-CON M20 20 MEQ tablet TAKE 1 TABLET BY MOUTH TWICE DAILY Qty: 180 tablet, Refills: 3    levothyroxine (SYNTHROID, LEVOTHROID) 75 MCG tablet Take 75 mcg by mouth daily.    losartan  (COZAAR) 50 MG tablet Take 50 mg by mouth at bedtime.     metoprolol (LOPRESSOR) 100 MG tablet TAKE 1 TABLET (100 MG TOTAL) BY MOUTH 2 (TWO) TIMES DAILY. Qty: 180 tablet, Refills: 3    Multiple Vitamins-Minerals (MULTIVITAMIN WITH MINERALS) tablet Take 1 tablet by mouth daily.    nitroGLYCERIN (NITROSTAT) 0.4 MG SL tablet Place 1 tablet (0.4 mg total) under the tongue every 5 (five) minutes as needed for chest pain. Qty: 25 tablet, Refills: 1   Associated Diagnoses: Atherosclerosis of native coronary artery of native heart with angina pectoris ([place]); Chronic atrial fibrillation (HCC)    omeprazole (PRILOSEC OTC) 20 MG tablet Take 20 mg by mouth daily.      RANEXA 500 MG 12 hr tablet Take 1 tablet by mouth 2 (two) times daily.    sulfaSALAzine (AZULFIDINE) 500 MG tablet Take 1,000 mg by mouth 2 (two) times daily. Refills: 11    Tiotropium Bromide Monohydrate (SPIRIVA RESPIMAT) 2.5 MCG/ACT AERS Inhale 2 puffs into the lungs daily. Qty: 4 g, Refills: 6    tiZANidine (ZANAFLEX) 4 MG tablet Take 4 mg by mouth every 8 (eight) hours as  needed for muscle spasms.  Refills: 1    Vitamin D, Ergocalciferol, (DRISDOL) 50000 units CAPS capsule Take 50,000 Units by mouth every Sunday. Refills: 1    ipratropium-albuterol (DUONEB) 0.5-2.5 (3) MG/3ML SOLN Take 3 mLs by nebulization every 6 (six) hours as needed (wheezing and or shortness of breath). Qty: 360 mL, Refills: 0      STOP taking these medications     HYDROcodone-acetaminophen (NORCO) 7.5-325 MG tablet        No Known Allergies Follow-up Information    Please follow up.   Why:  MD at SNF       The results of significant diagnostics from this hospitalization (including imaging, microbiology, ancillary and laboratory) are listed below for reference.    Significant Diagnostic Studies: Dg Chest 2 View  10/10/2015  CLINICAL DATA:  Shortness of breath and cough.  Initial encounter. EXAM: CHEST  2 VIEW COMPARISON:  PA and lateral  chest 03/28/2015 02/15/2015. FINDINGS: Pacing device is again seen. There is cardiomegaly and mild vascular congestion without frank edema. No consolidative process, pneumothorax or effusion. No focal bony abnormality. IMPRESSION: No acute abnormality. Cardiomegaly and pulmonary vascular congestion. Electronically Signed   By: Inge Rise M.D.   On: 10/10/2015 12:51    Microbiology: No results found for this or any previous visit (from the past 240 hour(s)).   Labs: Basic Metabolic Panel:  Recent Labs Lab 10/10/15 1242 10/11/15 0448 10/12/15 0500 10/13/15 1048  NA 138 139 137 139  K 3.5 3.6 3.8 3.8  CL 100* 102 100* 101  CO2 25 25 28 25   GLUCOSE 157* 107* 128* 185*  BUN 27* 30* 31* 28*  CREATININE 1.06* 1.12* 1.43* 1.24*  CALCIUM 6.3* 5.8* 6.3* 6.3*  MG  --  1.2* 1.7  --   PHOS  --  5.0*  --   --    Liver Function Tests:  Recent Labs Lab 10/12/15 0500  AST 37  ALT 19  ALKPHOS 113  BILITOT 0.8  PROT 5.8*  ALBUMIN 3.0*   No results for input(s): LIPASE, AMYLASE in the last 168 hours. No results for input(s): AMMONIA in the last 168 hours. CBC:  Recent Labs Lab 10/10/15 1242 10/11/15 0448  WBC 9.3 7.5  NEUTROABS 7.0  --   HGB 14.4 13.0  HCT 43.9 39.3  MCV 99.3 98.5  PLT 273 256   Cardiac Enzymes: No results for input(s): CKTOTAL, CKMB, CKMBINDEX, TROPONINI in the last 168 hours. BNP: BNP (last 3 results)  Recent Labs  02/15/15 1129 03/28/15 1857  BNP 1515.5* 224.0*    ProBNP (last 3 results) No results for input(s): PROBNP in the last 8760 hours.  CBG:  Recent Labs Lab 10/12/15 1225 10/12/15 1712 10/12/15 2250 10/13/15 0741 10/13/15 1148  GLUCAP 147* 135* 115* 109* 179*       Signed:  Louellen Molder MD.  [facility] 10/13/2015, 12:54 PM

## 2015-10-16 ENCOUNTER — Non-Acute Institutional Stay (SKILLED_NURSING_FACILITY): Payer: Medicare Other | Admitting: Internal Medicine

## 2015-10-16 ENCOUNTER — Encounter: Payer: Self-pay | Admitting: Internal Medicine

## 2015-10-16 DIAGNOSIS — E038 Other specified hypothyroidism: Secondary | ICD-10-CM

## 2015-10-16 DIAGNOSIS — Z6841 Body Mass Index (BMI) 40.0 and over, adult: Secondary | ICD-10-CM | POA: Diagnosis not present

## 2015-10-16 DIAGNOSIS — J432 Centrilobular emphysema: Secondary | ICD-10-CM

## 2015-10-16 DIAGNOSIS — E034 Atrophy of thyroid (acquired): Secondary | ICD-10-CM | POA: Diagnosis not present

## 2015-10-16 DIAGNOSIS — I110 Hypertensive heart disease with heart failure: Secondary | ICD-10-CM

## 2015-10-16 DIAGNOSIS — I480 Paroxysmal atrial fibrillation: Secondary | ICD-10-CM | POA: Diagnosis not present

## 2015-10-16 DIAGNOSIS — N179 Acute kidney failure, unspecified: Secondary | ICD-10-CM | POA: Diagnosis not present

## 2015-10-16 DIAGNOSIS — I5043 Acute on chronic combined systolic (congestive) and diastolic (congestive) heart failure: Secondary | ICD-10-CM

## 2015-10-16 NOTE — Assessment & Plan Note (Signed)
SNF - chronic and stable;cont dvair and spiriva scheduled with prn's

## 2015-10-16 NOTE — Assessment & Plan Note (Signed)
Recent parathyroidectomy. Corrected calcium of 7.1. Low calcium and magnesium replenished IV. started on supplemental oral calcium (dose increased today). SNF - Continue weekly vitamin D; check calcium and magnesium levels early next week.

## 2015-10-16 NOTE — Assessment & Plan Note (Signed)
On presentation secondary to fluid overload and hypocalcemia. rate controlled.SNF -  Continue home dose metoprolol and anticoagulation.; recheck Ca+

## 2015-10-16 NOTE — Progress Notes (Signed)
MRN: [id number] Name: [name]  Sex: female Age: 75 y.o. DOB: 08-15-1941  [place] #: [facility] Facility/Room:118 Level Of Care: SNF Provider: Inocencio Homes D Emergency Contacts: Extended Emergency Contact Information Primary Emergency Contact: Snider,Bill Address: [place], [place] [address] Johnnette Litter of [place] Phone: [phone] Relation: Relative Secondary Emergency Contact: [place] of [place] Mobile Phone: [phone] Relation: Daughter  Code Status:   Allergies: Review of patient's allergies indicates no known allergies.  Chief Complaint  Patient presents with  . New Admit To SNF    HPI: Patient is 75 y.o. female whohistory of A. fib on eliquis , chronic diastolic CHF, primary hyper parathyroidism status post left inferior parathyroidectomy by Dr. Harlow Asa on 10/03/2015 presented with progressive fatigue with muscle aches and twitching associated with dyspnea on exertion and increased leg swellings. In the ED she was found to have heart rate in the range of 115-120s. Blood work showing calcium of 6.3 and chest x-ray worrisome for pulmonary vascular congestion. Pt was admitted to [facility] from 1/10-13 where she was treated for acute on chronic CHF, sumptomatic hypocalcemia and AF with RVR. Hospitalization eas further complicated by AKI. While at SNF pt will be followed for DM2, tx with insulin, hypothyroidism, tx with synthroid and HTN, tx with metoprolol, lasix and cozaar.  Past Medical History  Diagnosis Date  . Nonischemic cardiomyopathy ([place])     history of,  EF 20-25% at left heart cath in 06/2007; echo 2011 had normal LV function  . Chronic diastolic heart failure ([place])     Echo 06/2010: Moderate LVH, EF 55-65%, normal wall motion, mild MR, moderate to severe LAE, mild RAE.   . Morbid obesity ([place])   . Hyperlipidemia   . Tachy-brady syndrome ([place])     status post implant of a medtronic dual-chamber pacemaker in  2001.  explanted in 2010 -- Tye pacemaker in 2010.  Marland Kitchen Chronic obstructive pulmonary disease ([place])   . Osteoporosis   . Depression   . GERD (gastroesophageal reflux disease)   . Crohn's disease ([place])   . Hypothyroidism     treated  . Seasonal allergies   . Pacemaker     Medtronic  . Atrial fibrillation ([place])     Status post pulmonary vein isolation 2009 at [facility]  . CAD (coronary artery disease)     LHC 05/2007: pLAD 70-80%, pRCA 40%, EF 20-25%. LAD lesion treated medically.   . Coronary artery disease 06/15/2012  . Atrial fibrillation ([place]) 12/18/2007    Annotation: refractory Qualifier: Diagnosis of  By: Doy Mince LPN, Megan    . Morbid obesity with BMI of 40.0-44.9, adult ([place]) 06/22/2012  . Diabetes mellitus ([place]) 06/22/2012  . Diabetes mellitus without complication ([place])   . COPD (chronic obstructive pulmonary disease) ([place])   . Gout   . Dysrhythmia     atrial fib  . Peripheral vascular disease (HCC) 15    rt arm clots  . Obstructive sleep apnea     continuous positive airway pressure not using at present  . Shortness of breath dyspnea     exersion  . CHF (congestive heart failure) ([place])   . On home oxygen therapy     "2L at night" (10/03/2015)    Past Surgical History  Procedure Laterality Date  . Pulmonary vein isolation  05/12/2008    RFCA atrial fibrillation  . Post ablation pseudoaneurysm  at A fib ablation  . Pacemaker insertion  2010    medtronic ADAPTA   . Tonsillectomy    . Embolectomy Right 08/16/2014    Procedure: EMBOLECTOMY- RIGHT BRACHIAL ARTERY;  Surgeon: Serafina Mitchell, MD;  Location: [facility];  Service: Vascular;  Laterality: Right;  . Cardiac catheterization  08/2000    noncritical disease mid RCA, EF preserved  . Cardiac catheterization  05/29/2007    noncritical disease, EF 20-25%  . Cataract extraction w/ intraocular lens  implant, bilateral Bilateral 2014-2016  . Parathyroidectomy Right 01/30/2015    Procedure:  PARATHYROIDECTOMY;  Surgeon: Armandina Gemma, MD;  Location: [facility];  Service: General;  Laterality: Right;  . Parathyroidectomy Left 10/03/2015    Left inferior parathyroidectomy w/neck exploration  . Parathyroidectomy Left 10/03/2015    Procedure: LEFT PARATHYROID EXPLORATION AND  PARATHYROIDECTOMY;  Surgeon: Armandina Gemma, MD;  Location: [facility];  Service: General;  Laterality: Left;      Medication List       This list is accurate as of: 10/16/15 11:59 PM.  Always use your most recent med list.               albuterol 108 (90 Base) MCG/ACT inhaler  Commonly known as:  PROVENTIL HFA;VENTOLIN HFA  Inhale 2 puffs into the lungs every 6 (six) hours as needed for wheezing or shortness of breath.     atorvastatin 80 MG tablet  Commonly known as:  LIPITOR  Take 80 mg by mouth at bedtime.     calcium carbonate 1250 (500 Ca) MG tablet  Commonly known as:  OS-CAL - dosed in mg of elemental calcium  Take 2 tablets (1,000 mg of elemental calcium total) by mouth 3 (three) times daily with meals.     colchicine 0.6 MG tablet  Take 1-2 tablets by mouth daily as needed (gout). TAKE 2 TABLETS BY MOUTH AS ONE DOSE, THEN 1 TABLET IN 1 HOUR FOR ACUTE GOUT EPISODE     diphenhydramine-acetaminophen 25-500 MG Tabs tablet  Commonly known as:  TYLENOL PM  Take 2 tablets by mouth at bedtime as needed (sleep/pain).     ELIQUIS 5 MG Tabs tablet  Generic drug:  apixaban  TAKE 1 TABLET BY MOUTH TWICE A DAY     Fiber Caps  Take 1 tablet by mouth at bedtime.     Fluticasone-Salmeterol 250-50 MCG/DOSE Aepb  Commonly known as:  ADVAIR  Inhale 1 puff into the lungs every 12 (twelve) hours.     folic acid 1 MG tablet  Commonly known as:  FOLVITE  Take 1 mg by mouth daily.     furosemide 40 MG tablet  Commonly known as:  LASIX  TAKE 80 MG BY MOUTH TWICE A DAY     HYDROcodone-acetaminophen 5-325 MG tablet  Commonly known as:  NORCO/VICODIN  Take 2 tablets by mouth every 6 (six) hours as needed for moderate  pain.     insulin glargine 100 UNIT/ML injection  Commonly known as:  LANTUS  Inject 0.2 mLs (20 Units total) into the skin every morning.     ipratropium-albuterol 0.5-2.5 (3) MG/3ML Soln  Commonly known as:  DUONEB  Take 3 mLs by nebulization every 6 (six) hours as needed (wheezing and or shortness of breath).     KLOR-CON M20 20 MEQ tablet  Generic drug:  potassium chloride SA  TAKE 1 TABLET BY MOUTH TWICE DAILY     levothyroxine 75 MCG tablet  Commonly known as:  SYNTHROID, LEVOTHROID  Take 75 mcg by mouth daily.     losartan 50 MG tablet  Commonly known as:  COZAAR  Take 50 mg by mouth at bedtime.     metoprolol 100 MG tablet  Commonly known as:  LOPRESSOR  TAKE 1 TABLET (100 MG TOTAL) BY MOUTH 2 (TWO) TIMES DAILY.     multivitamin with minerals tablet  Take 1 tablet by mouth daily.     nitroGLYCERIN 0.4 MG SL tablet  Commonly known as:  NITROSTAT  Place 1 tablet (0.4 mg total) under the tongue every 5 (five) minutes as needed for chest pain.     omeprazole 20 MG tablet  Commonly known as:  PRILOSEC OTC  Take 20 mg by mouth daily.     RANEXA 500 MG 12 hr tablet  Generic drug:  ranolazine  Take 1 tablet by mouth 2 (two) times daily.     sulfaSALAzine 500 MG tablet  Commonly known as:  AZULFIDINE  Take 1,000 mg by mouth 2 (two) times daily.     Tiotropium Bromide Monohydrate 2.5 MCG/ACT Aers  Commonly known as:  SPIRIVA RESPIMAT  Inhale 2 puffs into the lungs daily.     tiZANidine 4 MG tablet  Commonly known as:  ZANAFLEX  Take 4 mg by mouth every 8 (eight) hours as needed for muscle spasms.     Vitamin D (Ergocalciferol) 50000 units Caps capsule  Commonly known as:  DRISDOL  Take 50,000 Units by mouth every Sunday.        No orders of the defined types were placed in this encounter.    Immunization History  Administered Date(s) Administered  . Influenza-Unspecified 06/30/2014, 07/01/2015    Social History  Substance Use Topics  . Smoking  status: Former Smoker -- 3.00 packs/day for 32 years    Quit date: 09/30/1986  . Smokeless tobacco: Former Systems developer    Quit date: 08/15/1987  . Alcohol Use: No    Family history is + HD, breast CA   Review of Systems  DATA OBTAINED: from patient, nurse GENERAL:  no fevers, fatigue, appetite changes SKIN: No itching, rash or wounds EYES: No eye pain, redness, discharge EARS: No earache, tinnitus, change in hearing NOSE: No congestion, drainage or bleeding  MOUTH/THROAT: No mouth or tooth pain, No sore throat RESPIRATORY: No cough, wheezing, SOB CARDIAC: No chest pain, palpitations, lower extremity edema  GI: No abdominal pain, No N/V/D or constipation, No heartburn or reflux  GU: No dysuria, frequency or urgency, or incontinence  MUSCULOSKELETAL: No unrelieved bone/joint pain NEUROLOGIC: No headache, dizziness or focal weakness PSYCHIATRIC: No c/o anxiety or sadness   There were no vitals filed for this visit.  SpO2 Readings from Last 1 Encounters:  10/13/15 93%        Physical Exam  GENERAL APPEARANCE: Alert, conversant,  No acute distress.  SKIN: No diaphoresis rash HEAD: Normocephalic, atraumatic  EYES: Conjunctiva/lids clear. Pupils round, reactive. EOMs intact.  EARS: External exam WNL, canals clear. Hearing grossly normal.  NOSE: No deformity or discharge.  MOUTH/THROAT: Lips w/o lesions  RESPIRATORY: Breathing is even, unlabored. Lung sounds are clear   CARDIOVASCULAR: Heart RRR no murmurs, rubs or gallops. No peripheral edema.   GASTROINTESTINAL: Abdomen is soft, non-tender, not distended w/ normal bowel sounds. GENITOURINARY: Bladder non tender, not distended  MUSCULOSKELETAL: No abnormal joints or musculature NEUROLOGIC:  Cranial nerves 2-12 grossly intact. Moves all extremities  PSYCHIATRIC: Mood and affect appropriate to situation, no behavioral issues  Patient Active Problem List  Diagnosis Date Noted  . AKI (acute kidney injury) ([place]) 10/16/2015  .  Hypomagnesemia 10/13/2015  . Malnutrition of moderate degree 10/11/2015  . Hypocalcemia 10/10/2015  . Acute on chronic combined systolic and diastolic CHF, NYHA class 3 ([place]) 10/10/2015  . COPD, moderate ([place]) 04/25/2015  . Fever 03/28/2015  . Near syncope 03/28/2015  . Uncomplicated asthma [id number]  . CKD (chronic kidney disease) stage 3, GFR 30-59 ml/min 03/13/2015  . Chronic obstructive pulmonary disease ([place]) 03/13/2015  . Crohn's disease of large intestine without complication ([place]) [id number]  . Gastro-esophageal reflux disease without esophagitis 03/13/2015  . Personal history of infectious and parasitic disease 03/13/2015  . Gout 03/13/2015  . Hypertensive heart disease with CHF (congestive heart failure) ([place]) 03/13/2015  . Hypothyroidism 03/13/2015  . Low back pain 03/13/2015  . Extreme obesity ([place]) 03/13/2015  . Paroxysmal atrial fibrillation ([place]) 03/13/2015  . Osteopenia 03/13/2015  . Primary hyperparathyroidism ([place]) 03/13/2015  . Secondary polycythemia 03/13/2015  . Sleep apnea 03/13/2015  . Type 2 diabetes mellitus with diabetic nephropathy ([place]) 03/13/2015  . COPD exacerbation ([place]) 02/18/2015  . OSA (obstructive sleep apnea) 02/18/2015  . 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 Acute on chronic respiratory failure with hypoxia ([place]) 02/18/2015  . Hyperparathyroidism, primary ([place]) 01/30/2015  . Obesity 11/10/2014  . Ischemia of hand 08/15/2014  . Morbid obesity with BMI of 40.0-44.9, adult ([place]) 06/22/2012  . Coronary artery disease 06/15/2012  . Long term current use of anticoagulant therapy 01/11/2011  . CARDIAC PACEMAKER IN SITU-MEDTRONIC ADAPTA L 01/15/2010  . DIASTOLIC HEART FAILURE, CHRONIC 12/22/2008  . DYSLIPIDEMIA 09/05/2008  . OBESITY-MORBID (>100') 09/05/2008  . OTHER OSTEOPOROSIS 09/05/2008  . CROHN'S DISEASE, HX OF 09/05/2008  . COPD with emphysema ([place]) 01/02/2008  . HYPERSOMNIA 12/21/2007  . ANXIETY  12/18/2007  . DEPRESSION 12/18/2007  . Atrial fibrillation ([place]) 12/18/2007    CBC    Component Value Date/Time   WBC 7.5 10/11/2015 0448   RBC 3.99 10/11/2015 0448   HGB 13.0 10/11/2015 0448   HCT 39.3 10/11/2015 0448   PLT 256 10/11/2015 0448   MCV 98.5 10/11/2015 0448   LYMPHSABS 0.6* 10/10/2015 1242   MONOABS 1.7* 10/10/2015 1242   EOSABS 0.0 10/10/2015 1242   BASOSABS 0.0 10/10/2015 1242    CMP     Component Value Date/Time   NA 139 10/13/2015 1048   K 3.8 10/13/2015 1048   CL 101 10/13/2015 1048   CO2 25 10/13/2015 1048   GLUCOSE 185* 10/13/2015 1048   BUN 28* 10/13/2015 1048   CREATININE 1.24* 10/13/2015 1048   CALCIUM 6.3* 10/13/2015 1048   CALCIUM 10.0 06/06/2007 0420   PROT 5.8* 10/12/2015 0500   ALBUMIN 3.0* 10/12/2015 0500   AST 37 10/12/2015 0500   ALT 19 10/12/2015 0500   ALKPHOS 113 10/12/2015 0500   BILITOT 0.8 10/12/2015 0500   GFRNONAA 41* 10/13/2015 1048   GFRAA 48* 10/13/2015 1048    Lab Results  Component Value Date   HGBA1C 7.0* 03/29/2015     Dg Chest 2 View  10/10/2015  CLINICAL DATA:  Shortness of breath and cough.  Initial encounter. EXAM: CHEST  2 VIEW COMPARISON:  PA and lateral chest 03/28/2015 02/15/2015. FINDINGS: Pacing device is again seen. There is cardiomegaly and mild vascular congestion without frank edema. No consolidative process, pneumothorax or effusion. No focal bony abnormality. IMPRESSION: No acute abnormality. Cardiomegaly and pulmonary vascular congestion. Electronically Signed   By: Inge Rise M.D.   On:  10/10/2015 12:51    Not all labs, radiology exams or other studies done during hospitalization come through on my EPIC note; however they are reviewed by me.    Assessment and Plan  Acute on chronic combined systolic and diastolic CHF, NYHA class 3 ([place]) Monitored on telemetry. Placed on IV Lasix 60 mg twice a day (patient is an 80 mg by mouth Lasix twice a day at home). Patient reports her normal weight  to be around 268 pounds . (was 276 found on presentation)  -Has diuresed well with about 4 pound weight loss since admission. Bilateral leg edema has resolved and denies further shortness of breath. 2-D echo from 01/2015 with EF of 55%. -Continue remaining home medications. (Beta blocker, statin, losartan and ranolazine.) -Patient seen by physical therapy and recommends skilled nursing facility. Stable to be discharged today. SNF - follow up BMP  Paroxysmal atrial fibrillation On presentation secondary to fluid overload and hypocalcemia. rate controlled.SNF -  Continue home dose metoprolol and anticoagulation.; recheck Ca+   Hypocalcemia Recent parathyroidectomy. Corrected calcium of 7.1. Low calcium and magnesium replenished IV. started on supplemental oral calcium (dose increased today). SNF - Continue weekly vitamin D; check calcium and magnesium levels early next week.  Hypothyroidism SNF - chronic ans stable;cont synthroid 100 mcg  AKI (acute kidney injury) ([place]) SNF - 2/2 IV lasix; resolving; will do f/u BMP  Chronic obstructive pulmonary disease SNF - chronic and stable;cont dvair and spiriva scheduled with prn's  Morbid obesity with BMI of 40.0-44.9, adult (HCC) SNF - appropriate diet and pprtions  Hypertensive heart disease with CHF (congestive heart failure) ([place]) SNF - controlled; cont lopressor, lasix and cozaar   Time spent > 45 min;> 50% of time with patient was spent reviewing records, labs, tests and studies, counseling and developing plan of care  Hennie Duos, MD

## 2015-10-16 NOTE — Assessment & Plan Note (Signed)
SNF - chronic ans stable;cont synthroid 100 mcg

## 2015-10-16 NOTE — Assessment & Plan Note (Signed)
SNF - 2/2 IV lasix; resolving; will do f/u BMP

## 2015-10-16 NOTE — Assessment & Plan Note (Signed)
Monitored on telemetry. Placed on IV Lasix 60 mg twice a day (patient is an 80 mg by mouth Lasix twice a day at home). Patient reports her normal weight to be around 268 pounds . (was 276 found on presentation)  -Has diuresed well with about 4 pound weight loss since admission. Bilateral leg edema has resolved and denies further shortness of breath. 2-D echo from 01/2015 with EF of 55%. -Continue remaining home medications. (Beta blocker, statin, losartan and ranolazine.) -Patient seen by physical therapy and recommends skilled nursing facility. Stable to be discharged today. SNF - follow up BMP

## 2015-10-16 NOTE — Assessment & Plan Note (Signed)
SNF - appropriate diet and pprtions

## 2015-10-20 ENCOUNTER — Encounter: Payer: Self-pay | Admitting: Internal Medicine

## 2015-10-20 NOTE — Assessment & Plan Note (Signed)
SNF - controlled; cont lopressor, lasix and cozaar

## 2015-10-27 ENCOUNTER — Encounter (HOSPITAL_COMMUNITY): Payer: Self-pay | Admitting: Cardiology

## 2015-10-27 ENCOUNTER — Emergency Department (HOSPITAL_COMMUNITY): Payer: Medicare Other

## 2015-10-27 ENCOUNTER — Inpatient Hospital Stay (HOSPITAL_COMMUNITY)
Admission: EM | Admit: 2015-10-27 | Discharge: 2015-11-01 | DRG: 292 | Disposition: A | Discharge status: Skilled Nursing Facility | Payer: Medicare Other | Attending: Internal Medicine | Admitting: Internal Medicine

## 2015-10-27 DIAGNOSIS — G4733 Obstructive sleep apnea (adult) (pediatric): Secondary | ICD-10-CM | POA: Diagnosis present

## 2015-10-27 DIAGNOSIS — Z961 Presence of intraocular lens: Secondary | ICD-10-CM | POA: Diagnosis present

## 2015-10-27 DIAGNOSIS — E1121 Type 2 diabetes mellitus with diabetic nephropathy: Secondary | ICD-10-CM | POA: Diagnosis present

## 2015-10-27 DIAGNOSIS — N390 Urinary tract infection, site not specified: Secondary | ICD-10-CM | POA: Diagnosis not present

## 2015-10-27 DIAGNOSIS — E785 Hyperlipidemia, unspecified: Secondary | ICD-10-CM | POA: Diagnosis present

## 2015-10-27 DIAGNOSIS — E039 Hypothyroidism, unspecified: Secondary | ICD-10-CM | POA: Diagnosis present

## 2015-10-27 DIAGNOSIS — Z9841 Cataract extraction status, right eye: Secondary | ICD-10-CM | POA: Diagnosis not present

## 2015-10-27 DIAGNOSIS — K219 Gastro-esophageal reflux disease without esophagitis: Secondary | ICD-10-CM | POA: Diagnosis present

## 2015-10-27 DIAGNOSIS — I509 Heart failure, unspecified: Secondary | ICD-10-CM | POA: Diagnosis not present

## 2015-10-27 DIAGNOSIS — E1151 Type 2 diabetes mellitus with diabetic peripheral angiopathy without gangrene: Secondary | ICD-10-CM | POA: Diagnosis present

## 2015-10-27 DIAGNOSIS — B9620 Unspecified Escherichia coli [E. coli] as the cause of diseases classified elsewhere: Secondary | ICD-10-CM | POA: Diagnosis present

## 2015-10-27 DIAGNOSIS — Z950 Presence of cardiac pacemaker: Secondary | ICD-10-CM

## 2015-10-27 DIAGNOSIS — N183 Chronic kidney disease, stage 3 (moderate): Secondary | ICD-10-CM | POA: Diagnosis not present

## 2015-10-27 DIAGNOSIS — I5033 Acute on chronic diastolic (congestive) heart failure: Secondary | ICD-10-CM

## 2015-10-27 DIAGNOSIS — Z6837 Body mass index (BMI) 37.0-37.9, adult: Secondary | ICD-10-CM | POA: Diagnosis not present

## 2015-10-27 DIAGNOSIS — Z79899 Other long term (current) drug therapy: Secondary | ICD-10-CM | POA: Diagnosis not present

## 2015-10-27 DIAGNOSIS — E1122 Type 2 diabetes mellitus with diabetic chronic kidney disease: Secondary | ICD-10-CM | POA: Diagnosis present

## 2015-10-27 DIAGNOSIS — Z9842 Cataract extraction status, left eye: Secondary | ICD-10-CM

## 2015-10-27 DIAGNOSIS — Z794 Long term (current) use of insulin: Secondary | ICD-10-CM | POA: Diagnosis not present

## 2015-10-27 DIAGNOSIS — J9610 Chronic respiratory failure, unspecified whether with hypoxia or hypercapnia: Secondary | ICD-10-CM | POA: Diagnosis present

## 2015-10-27 DIAGNOSIS — I472 Ventricular tachycardia: Secondary | ICD-10-CM | POA: Diagnosis not present

## 2015-10-27 DIAGNOSIS — Z9981 Dependence on supplemental oxygen: Secondary | ICD-10-CM

## 2015-10-27 DIAGNOSIS — M109 Gout, unspecified: Secondary | ICD-10-CM | POA: Diagnosis present

## 2015-10-27 DIAGNOSIS — I5043 Acute on chronic combined systolic (congestive) and diastolic (congestive) heart failure: Principal | ICD-10-CM | POA: Diagnosis present

## 2015-10-27 DIAGNOSIS — R0600 Dyspnea, unspecified: Secondary | ICD-10-CM | POA: Diagnosis present

## 2015-10-27 DIAGNOSIS — I429 Cardiomyopathy, unspecified: Secondary | ICD-10-CM | POA: Diagnosis present

## 2015-10-27 DIAGNOSIS — M810 Age-related osteoporosis without current pathological fracture: Secondary | ICD-10-CM | POA: Diagnosis present

## 2015-10-27 DIAGNOSIS — J449 Chronic obstructive pulmonary disease, unspecified: Secondary | ICD-10-CM | POA: Diagnosis present

## 2015-10-27 DIAGNOSIS — K5090 Crohn's disease, unspecified, without complications: Secondary | ICD-10-CM | POA: Diagnosis present

## 2015-10-27 DIAGNOSIS — E876 Hypokalemia: Secondary | ICD-10-CM | POA: Diagnosis present

## 2015-10-27 DIAGNOSIS — Z7901 Long term (current) use of anticoagulants: Secondary | ICD-10-CM

## 2015-10-27 DIAGNOSIS — F329 Major depressive disorder, single episode, unspecified: Secondary | ICD-10-CM | POA: Diagnosis present

## 2015-10-27 DIAGNOSIS — I2510 Atherosclerotic heart disease of native coronary artery without angina pectoris: Secondary | ICD-10-CM | POA: Diagnosis present

## 2015-10-27 DIAGNOSIS — Z87891 Personal history of nicotine dependence: Secondary | ICD-10-CM | POA: Diagnosis not present

## 2015-10-27 DIAGNOSIS — I482 Chronic atrial fibrillation: Secondary | ICD-10-CM | POA: Diagnosis present

## 2015-10-27 LAB — URINALYSIS, ROUTINE W REFLEX MICROSCOPIC
Glucose, UA: NEGATIVE mg/dL
KETONES UR: NEGATIVE mg/dL
NITRITE: POSITIVE — AB
PH: 5.5 (ref 5.0–8.0)
Protein, ur: >300 mg/dL — AB
Specific Gravity, Urine: 1.018 (ref 1.005–1.030)

## 2015-10-27 LAB — COMPREHENSIVE METABOLIC PANEL
ALK PHOS: 190 U/L — AB (ref 38–126)
ALT: 27 U/L (ref 14–54)
AST: 31 U/L (ref 15–41)
Albumin: 2.7 g/dL — ABNORMAL LOW (ref 3.5–5.0)
Anion gap: 10 (ref 5–15)
BILIRUBIN TOTAL: 1.8 mg/dL — AB (ref 0.3–1.2)
BUN: 19 mg/dL (ref 6–20)
CO2: 28 mmol/L (ref 22–32)
CREATININE: 1.35 mg/dL — AB (ref 0.44–1.00)
Calcium: 7.2 mg/dL — ABNORMAL LOW (ref 8.9–10.3)
Chloride: 100 mmol/L — ABNORMAL LOW (ref 101–111)
GFR calc Af Amer: 43 mL/min — ABNORMAL LOW (ref >60)
GFR, EST NON AFRICAN AMERICAN: 37 mL/min — AB (ref >60)
Glucose, Bld: 144 mg/dL — ABNORMAL HIGH (ref 65–99)
Potassium: 4.1 mmol/L (ref 3.5–5.1)
Sodium: 138 mmol/L (ref 135–145)
TOTAL PROTEIN: 7.1 g/dL (ref 6.5–8.1)

## 2015-10-27 LAB — CBC
HCT: 45.9 % (ref 36.0–46.0)
Hemoglobin: 15.3 g/dL — ABNORMAL HIGH (ref 12.0–15.0)
MCH: 32.7 pg (ref 26.0–34.0)
MCHC: 33.3 g/dL (ref 30.0–36.0)
MCV: 98.1 fL (ref 78.0–100.0)
PLATELETS: 387 10*3/uL (ref 150–400)
RBC: 4.68 MIL/uL (ref 3.87–5.11)
RDW: 14.4 % (ref 11.5–15.5)
WBC: 14.4 10*3/uL — AB (ref 4.0–10.5)

## 2015-10-27 LAB — TROPONIN I: Troponin I: 0.03 ng/mL (ref <0.031)

## 2015-10-27 LAB — GLUCOSE, CAPILLARY: Glucose-Capillary: 169 mg/dL — ABNORMAL HIGH (ref 65–99)

## 2015-10-27 LAB — URINE MICROSCOPIC-ADD ON

## 2015-10-27 LAB — I-STAT TROPONIN, ED: TROPONIN I, POC: 0.01 ng/mL (ref 0.00–0.08)

## 2015-10-27 LAB — BRAIN NATRIURETIC PEPTIDE: B Natriuretic Peptide: 1318.1 pg/mL — ABNORMAL HIGH (ref 0.0–100.0)

## 2015-10-27 MED ORDER — INSULIN ASPART 100 UNIT/ML ~~LOC~~ SOLN
0.0000 [IU] | Freq: Three times a day (TID) | SUBCUTANEOUS | Status: DC
  Administered 2015-10-28: 3 [IU] via SUBCUTANEOUS

## 2015-10-27 MED ORDER — METOPROLOL TARTRATE 100 MG PO TABS
100.0000 mg | ORAL_TABLET | Freq: Two times a day (BID) | ORAL | Status: DC
  Administered 2015-10-27 – 2015-11-01 (×10): 100 mg via ORAL
  Filled 2015-10-27 (×11): qty 1

## 2015-10-27 MED ORDER — RANOLAZINE ER 500 MG PO TB12
500.0000 mg | ORAL_TABLET | Freq: Two times a day (BID) | ORAL | Status: DC
  Administered 2015-10-27 – 2015-11-01 (×10): 500 mg via ORAL
  Filled 2015-10-27 (×10): qty 1

## 2015-10-27 MED ORDER — INSULIN ASPART 100 UNIT/ML ~~LOC~~ SOLN: 0.0000 [IU] | Freq: Every day | SUBCUTANEOUS | Status: DC

## 2015-10-27 MED ORDER — POTASSIUM CHLORIDE CRYS ER 20 MEQ PO TBCR
20.0000 meq | EXTENDED_RELEASE_TABLET | Freq: Two times a day (BID) | ORAL | Status: DC
  Administered 2015-10-27 – 2015-10-31 (×9): 20 meq via ORAL
  Filled 2015-10-27 (×10): qty 1

## 2015-10-27 MED ORDER — SODIUM CHLORIDE 0.9% FLUSH: 3.0000 mL | INTRAVENOUS | Status: DC | PRN

## 2015-10-27 MED ORDER — LEVOTHYROXINE SODIUM 75 MCG PO TABS
75.0000 ug | ORAL_TABLET | Freq: Every day | ORAL | Status: DC
  Administered 2015-10-28 – 2015-11-01 (×5): 75 ug via ORAL
  Filled 2015-10-27 (×5): qty 1

## 2015-10-27 MED ORDER — FUROSEMIDE 10 MG/ML IJ SOLN
80.0000 mg | Freq: Once | INTRAMUSCULAR | Status: AC
  Administered 2015-10-27: 80 mg via INTRAVENOUS
  Filled 2015-10-27: qty 8

## 2015-10-27 MED ORDER — ONDANSETRON HCL 4 MG/2ML IJ SOLN: 4.0000 mg | Freq: Four times a day (QID) | INTRAMUSCULAR | Status: DC | PRN

## 2015-10-27 MED ORDER — FIBER PO CAPS: 1.0000 | ORAL_CAPSULE | Freq: Every day | ORAL | Status: DC

## 2015-10-27 MED ORDER — SULFASALAZINE 500 MG PO TABS
1000.0000 mg | ORAL_TABLET | Freq: Two times a day (BID) | ORAL | Status: DC
  Administered 2015-10-28 – 2015-10-30 (×5): 1000 mg via ORAL
  Filled 2015-10-27 (×10): qty 2

## 2015-10-27 MED ORDER — CALCIUM POLYCARBOPHIL 625 MG PO TABS
625.0000 mg | ORAL_TABLET | Freq: Every day | ORAL | Status: DC
  Administered 2015-10-28 – 2015-10-31 (×4): 625 mg via ORAL
  Filled 2015-10-27 (×7): qty 1

## 2015-10-27 MED ORDER — ACETAMINOPHEN 325 MG PO TABS: 650.0000 mg | ORAL_TABLET | ORAL | Status: DC | PRN

## 2015-10-27 MED ORDER — FOLIC ACID 1 MG PO TABS
1.0000 mg | ORAL_TABLET | Freq: Every day | ORAL | Status: DC
  Administered 2015-10-28 – 2015-11-01 (×5): 1 mg via ORAL
  Filled 2015-10-27 (×5): qty 1

## 2015-10-27 MED ORDER — TIOTROPIUM BROMIDE MONOHYDRATE 18 MCG IN CAPS
18.0000 ug | ORAL_CAPSULE | Freq: Every day | RESPIRATORY_TRACT | Status: DC
  Administered 2015-10-28 – 2015-11-01 (×5): 18 ug via RESPIRATORY_TRACT
  Filled 2015-10-27 (×2): qty 5

## 2015-10-27 MED ORDER — PANTOPRAZOLE SODIUM 40 MG PO TBEC
40.0000 mg | DELAYED_RELEASE_TABLET | Freq: Every day | ORAL | Status: DC
  Administered 2015-10-28 – 2015-11-01 (×5): 40 mg via ORAL
  Filled 2015-10-27 (×5): qty 1

## 2015-10-27 MED ORDER — ZOLPIDEM TARTRATE 5 MG PO TABS
5.0000 mg | ORAL_TABLET | Freq: Every evening | ORAL | Status: DC | PRN
  Administered 2015-10-28 – 2015-10-30 (×3): 5 mg via ORAL
  Filled 2015-10-27 (×3): qty 1

## 2015-10-27 MED ORDER — TIOTROPIUM BROMIDE MONOHYDRATE 2.5 MCG/ACT IN AERS: 2.0000 | INHALATION_SPRAY | Freq: Every day | RESPIRATORY_TRACT | Status: DC

## 2015-10-27 MED ORDER — FUROSEMIDE 10 MG/ML IJ SOLN
80.0000 mg | Freq: Two times a day (BID) | INTRAMUSCULAR | Status: DC
  Administered 2015-10-28 – 2015-10-31 (×7): 80 mg via INTRAVENOUS
  Filled 2015-10-27 (×7): qty 8

## 2015-10-27 MED ORDER — VITAMIN D (ERGOCALCIFEROL) 1.25 MG (50000 UNIT) PO CAPS
50000.0000 [IU] | ORAL_CAPSULE | ORAL | Status: DC
  Administered 2015-10-29: 50000 [IU] via ORAL
  Filled 2015-10-27: qty 1

## 2015-10-27 MED ORDER — SODIUM CHLORIDE 0.9 % IV SOLN
1.0000 g | Freq: Once | INTRAVENOUS | Status: AC
  Administered 2015-10-27: 1 g via INTRAVENOUS
  Filled 2015-10-27: qty 10

## 2015-10-27 MED ORDER — DEXTROSE 5 % IV SOLN
1.0000 g | INTRAVENOUS | Status: DC
  Administered 2015-10-27 – 2015-10-29 (×3): 1 g via INTRAVENOUS
  Filled 2015-10-27 (×3): qty 10

## 2015-10-27 MED ORDER — DIPHENHYDRAMINE-APAP (SLEEP) 25-500 MG PO TABS: 2.0000 | ORAL_TABLET | Freq: Every evening | ORAL | Status: DC | PRN

## 2015-10-27 MED ORDER — OMEPRAZOLE MAGNESIUM 20 MG PO TBEC: 20.0000 mg | DELAYED_RELEASE_TABLET | Freq: Every day | ORAL | Status: DC

## 2015-10-27 MED ORDER — ALBUTEROL SULFATE (2.5 MG/3ML) 0.083% IN NEBU: 3.0000 mL | INHALATION_SOLUTION | Freq: Four times a day (QID) | RESPIRATORY_TRACT | Status: DC | PRN

## 2015-10-27 MED ORDER — IPRATROPIUM-ALBUTEROL 0.5-2.5 (3) MG/3ML IN SOLN
3.0000 mL | Freq: Four times a day (QID) | RESPIRATORY_TRACT | Status: DC | PRN
  Filled 2015-10-27: qty 3

## 2015-10-27 MED ORDER — SODIUM CHLORIDE 0.9 % IV SOLN: 250.0000 mL | INTRAVENOUS | Status: DC | PRN

## 2015-10-27 MED ORDER — INSULIN GLARGINE 100 UNIT/ML ~~LOC~~ SOLN
20.0000 [IU] | SUBCUTANEOUS | Status: DC
Start: 2015-10-28 — End: 2015-10-28
  Administered 2015-10-28: 20 [IU] via SUBCUTANEOUS
  Filled 2015-10-27 (×2): qty 0.2

## 2015-10-27 MED ORDER — IPRATROPIUM-ALBUTEROL 0.5-2.5 (3) MG/3ML IN SOLN
3.0000 mL | Freq: Once | RESPIRATORY_TRACT | Status: AC
  Administered 2015-10-27: 3 mL via RESPIRATORY_TRACT
  Filled 2015-10-27: qty 3

## 2015-10-27 MED ORDER — APIXABAN 5 MG PO TABS
5.0000 mg | ORAL_TABLET | Freq: Two times a day (BID) | ORAL | Status: DC
  Administered 2015-10-27 – 2015-11-01 (×10): 5 mg via ORAL
  Filled 2015-10-27 (×10): qty 1

## 2015-10-27 MED ORDER — ATORVASTATIN CALCIUM 80 MG PO TABS
80.0000 mg | ORAL_TABLET | Freq: Every day | ORAL | Status: DC
  Administered 2015-10-27 – 2015-10-31 (×5): 80 mg via ORAL
  Filled 2015-10-27 (×5): qty 1

## 2015-10-27 MED ORDER — TIZANIDINE HCL 4 MG PO TABS: 4.0000 mg | ORAL_TABLET | Freq: Three times a day (TID) | ORAL | Status: DC | PRN

## 2015-10-27 MED ORDER — ADULT MULTIVITAMIN W/MINERALS CH
1.0000 | ORAL_TABLET | Freq: Every day | ORAL | Status: DC
  Administered 2015-10-27 – 2015-10-28 (×2): 1 via ORAL
  Filled 2015-10-27 (×4): qty 1

## 2015-10-27 MED ORDER — SODIUM CHLORIDE 0.9% FLUSH
3.0000 mL | Freq: Two times a day (BID) | INTRAVENOUS | Status: DC
  Administered 2015-10-27 – 2015-11-01 (×10): 3 mL via INTRAVENOUS

## 2015-10-27 MED ORDER — MOMETASONE FURO-FORMOTEROL FUM 100-5 MCG/ACT IN AERO
2.0000 | INHALATION_SPRAY | Freq: Two times a day (BID) | RESPIRATORY_TRACT | Status: DC
  Administered 2015-10-27 – 2015-11-01 (×10): 2 via RESPIRATORY_TRACT
  Filled 2015-10-27: qty 8.8

## 2015-10-27 MED ORDER — CALCIUM CARBONATE 1250 (500 CA) MG PO TABS
2.0000 | ORAL_TABLET | Freq: Three times a day (TID) | ORAL | Status: DC
  Administered 2015-10-28 (×2): 1000 mg via ORAL
  Filled 2015-10-27 (×2): qty 1

## 2015-10-27 NOTE — ED Notes (Signed)
Pt reports she had parathyroid surgery done back in October and her PCP is concerned that her Ca is low. Also sent for possible PNA, has had a cough and vomiting.

## 2015-10-27 NOTE — ED Provider Notes (Signed)
CSN: [id number]     Arrival date & time 10/27/15  1230 History   First MD Initiated Contact with Patient 10/27/15 1531     Chief Complaint  Patient presents with  . Abnormal Lab  . Cough   HPI  [name] is a 75 y.o. F PMH significant for CHF, obesity, a-fib on eliquis, COPD, pacemaker for tachy-brady syndrome and DM presenting with multiple complaints. She states she was sent over from her PCP for low calcium s/p parathyroidectomy (10/03/15). Since 10/13/15, she has been taking 1000 mg calcium carbonate TID and vitamin D weekly. She states she is currently residing in a SNF, and she has had a cough for a week. She endorses bilateral leg edema, worse than usual. She endorses SOB over the last few days. She was recently admitted for acute on chronic CHF. No fevers, chills, N/V/D.  Past Medical History  Diagnosis Date  . Nonischemic cardiomyopathy ([place])     history of,  EF 20-25% at left heart cath in 06/2007; echo 2011 had normal LV function  . Chronic diastolic heart failure ([place])     Echo 06/2010: Moderate LVH, EF 55-65%, normal wall motion, mild MR, moderate to severe LAE, mild RAE.   . Morbid obesity ([place])   . Hyperlipidemia   . Tachy-brady syndrome ([place])     status post implant of a medtronic dual-chamber pacemaker in 2001.  explanted in 2010 -- [place] pacemaker in 2010.  Marland Kitchen Chronic obstructive pulmonary disease ([place])   . Osteoporosis   . Depression   . GERD (gastroesophageal reflux disease)   . Crohn's disease ([place])   . Hypothyroidism     treated  . Seasonal allergies   . Pacemaker     Medtronic  . Atrial fibrillation ([place])     Status post pulmonary vein isolation 2009 at [facility]  . CAD (coronary artery disease)     LHC 05/2007: pLAD 70-80%, pRCA 40%, EF 20-25%. LAD lesion treated medically.   . Coronary artery disease 06/15/2012  . Atrial fibrillation ([place]) 12/18/2007    Annotation: refractory Qualifier: Diagnosis of  By: Doy Mince LPN, Megan    .  Morbid obesity with BMI of 40.0-44.9, adult ([place]) 06/22/2012  . Diabetes mellitus ([place]) 06/22/2012  . Diabetes mellitus without complication ([place])   . COPD (chronic obstructive pulmonary disease) ([place])   . Gout   . Dysrhythmia     atrial fib  . Peripheral vascular disease (HCC) 15    rt arm clots  . Obstructive sleep apnea     continuous positive airway pressure not using at present  . Shortness of breath dyspnea     exersion  . CHF (congestive heart failure) ([place])   . On home oxygen therapy     "2L at night" (10/03/2015)   Past Surgical History  Procedure Laterality Date  . Pulmonary vein isolation  05/12/2008    RFCA atrial fibrillation  . Post ablation pseudoaneurysm      at A fib ablation  . Pacemaker insertion  2010    medtronic ADAPTA   . Tonsillectomy    . Embolectomy Right 08/16/2014    Procedure: EMBOLECTOMY- RIGHT BRACHIAL ARTERY;  Surgeon: Serafina Mitchell, MD;  Location: [facility] OR;  Service: Vascular;  Laterality: Right;  . Cardiac catheterization  08/2000    noncritical disease mid RCA, EF preserved  . Cardiac catheterization  05/29/2007    noncritical disease, EF 20-25%  . Cataract extraction w/ intraocular lens  implant,  bilateral Bilateral 2014-2016  . Parathyroidectomy Right 01/30/2015    Procedure: PARATHYROIDECTOMY;  Surgeon: Armandina Gemma, MD;  Location: [place];  Service: General;  Laterality: Right;  . Parathyroidectomy Left 10/03/2015    Left inferior parathyroidectomy w/neck exploration  . Parathyroidectomy Left 10/03/2015    Procedure: LEFT PARATHYROID EXPLORATION AND  PARATHYROIDECTOMY;  Surgeon: Armandina Gemma, MD;  Location: [facility] OR;  Service: General;  Laterality: Left;   Family History  Problem Relation Age of Onset  . Breast cancer Mother   . Heart disease Father   . Emphysema Father    Social History  Substance Use Topics  . Smoking status: Former Smoker -- 3.00 packs/day for 32 years    Quit date: 09/30/1986  . Smokeless tobacco: Former Systems developer    Quit date:  08/15/1987  . Alcohol Use: No   OB History    Gravida Para Term Preterm AB TAB SAB Ectopic Multiple Living   0 0 0 0 0 0 0 0       Review of Systems  Ten systems are reviewed and are negative for acute change except as noted in the HPI   Allergies  Review of patient's allergies indicates no known allergies.  Home Medications   Prior to Admission medications   Medication Sig Start Date End Date Taking? Authorizing Provider  albuterol (PROVENTIL HFA;VENTOLIN HFA) 108 (90 BASE) MCG/ACT inhaler Inhale 2 puffs into the lungs every 6 (six) hours as needed for wheezing or shortness of breath.    Historical Provider, MD  atorvastatin (LIPITOR) 80 MG tablet Take 80 mg by mouth at bedtime.      Historical Provider, MD  calcium carbonate (OS-CAL - DOSED IN MG OF ELEMENTAL CALCIUM) 1250 (500 Ca) MG tablet Take 2 tablets (1,000 mg of elemental calcium total) by mouth 3 (three) times daily with meals. 10/13/15   Nishant Dhungel, MD  colchicine 0.6 MG tablet Take 1-2 tablets by mouth daily as needed (gout). TAKE 2 TABLETS BY MOUTH AS ONE DOSE, THEN 1 TABLET IN 1 HOUR FOR ACUTE GOUT EPISODE 08/09/15   Historical Provider, MD  diphenhydramine-acetaminophen (TYLENOL PM) 25-500 MG TABS tablet Take 2 tablets by mouth at bedtime as needed (sleep/pain).    Historical Provider, MD  ELIQUIS 5 MG TABS tablet TAKE 1 TABLET BY MOUTH TWICE A DAY 08/21/15   Deboraha Sprang, MD  Fiber CAPS Take 1 tablet by mouth at bedtime.     Historical Provider, MD  Fluticasone-Salmeterol (ADVAIR) 250-50 MCG/DOSE AEPB Inhale 1 puff into the lungs every 12 (twelve) hours. 01/17/15   Kathee Delton, MD  folic acid (FOLVITE) 1 MG tablet Take 1 mg by mouth daily. 06/01/15   Historical Provider, MD  furosemide (LASIX) 40 MG tablet TAKE 80 MG BY MOUTH TWICE A DAY 10/13/15   Nishant Dhungel, MD  HYDROcodone-acetaminophen (NORCO/VICODIN) 5-325 MG tablet Take 2 tablets by mouth every 6 (six) hours as needed for moderate pain. 10/13/15   Nishant  Dhungel, MD  insulin glargine (LANTUS) 100 UNIT/ML injection Inject 0.2 mLs (20 Units total) into the skin every morning. 10/13/15   Nishant Dhungel, MD  ipratropium-albuterol (DUONEB) 0.5-2.5 (3) MG/3ML SOLN Take 3 mLs by nebulization every 6 (six) hours as needed (wheezing and or shortness of breath). 02/18/15   Nishant Dhungel, MD  KLOR-CON M20 20 MEQ tablet TAKE 1 TABLET BY MOUTH TWICE DAILY 04/13/15   Deboraha Sprang, MD  levothyroxine (SYNTHROID, LEVOTHROID) 75 MCG tablet Take 75 mcg by mouth daily. 01/20/15  Historical Provider, MD  losartan (COZAAR) 50 MG tablet Take 50 mg by mouth at bedtime.  01/16/15   Historical Provider, MD  metoprolol (LOPRESSOR) 100 MG tablet TAKE 1 TABLET (100 MG TOTAL) BY MOUTH 2 (TWO) TIMES DAILY. 12/23/14   Deboraha Sprang, MD  Multiple Vitamins-Minerals (MULTIVITAMIN WITH MINERALS) tablet Take 1 tablet by mouth daily.    Historical Provider, MD  nitroGLYCERIN (NITROSTAT) 0.4 MG SL tablet Place 1 tablet (0.4 mg total) under the tongue every 5 (five) minutes as needed for chest pain. 07/07/14 10/10/15  Deboraha Sprang, MD  omeprazole (PRILOSEC OTC) 20 MG tablet Take 20 mg by mouth daily.      Historical Provider, MD  RANEXA 500 MG 12 hr tablet Take 1 tablet by mouth 2 (two) times daily. 03/14/15   Historical Provider, MD  sulfaSALAzine (AZULFIDINE) 500 MG tablet Take 1,000 mg by mouth 2 (two) times daily. 03/16/15   Historical Provider, MD  Tiotropium Bromide Monohydrate (SPIRIVA RESPIMAT) 2.5 MCG/ACT AERS Inhale 2 puffs into the lungs daily. 03/16/15   Collene Gobble, MD  tiZANidine (ZANAFLEX) 4 MG tablet Take 4 mg by mouth every 8 (eight) hours as needed for muscle spasms.  09/13/15   Historical Provider, MD  Vitamin D, Ergocalciferol, (DRISDOL) 50000 units CAPS capsule Take 50,000 Units by mouth every Sunday. 07/28/15   Historical Provider, MD   BP 121/105 mmHg  Pulse 108  Temp(Src) 98.3 F (36.8 C) (Oral)  Resp 22  Wt 123.378 kg  SpO2 97% Physical Exam   Constitutional: She appears well-developed and well-nourished. No distress.  HENT:  Head: Normocephalic and atraumatic.  Mouth/Throat: Oropharynx is clear and moist. No oropharyngeal exudate.  Eyes: Conjunctivae are normal. Pupils are equal, round, and reactive to light. Right eye exhibits no discharge. Left eye exhibits no discharge. No scleral icterus.  Neck: No tracheal deviation present.  Cardiovascular: Normal rate, regular rhythm, normal heart sounds and intact distal pulses.  Exam reveals no gallop and no friction rub.   No murmur heard. Pulmonary/Chest: Effort normal and breath sounds normal. No respiratory distress. She has no wheezes. She has no rales. She exhibits no tenderness.  Abdominal: Soft. Bowel sounds are normal. She exhibits no distension and no mass. There is no tenderness. There is no rebound and no guarding.  Musculoskeletal: Normal range of motion. She exhibits edema. She exhibits no tenderness.  2+ edema BL LE extending up to mid calf  Lymphadenopathy:    She has no cervical adenopathy.  Neurological: She is alert. Coordination normal.  Skin: Skin is warm and dry. No rash noted. She is not diaphoretic. No erythema.  Psychiatric: She has a normal mood and affect. Her behavior is normal.  Nursing note and vitals reviewed.   ED Course  Procedures  Labs Review Labs Reviewed  COMPREHENSIVE METABOLIC PANEL - Abnormal; Notable for the following:    Chloride 100 (*)    Glucose, Bld 144 (*)    Creatinine, Ser 1.35 (*)    Calcium 7.2 (*)    Albumin 2.7 (*)    Alkaline Phosphatase 190 (*)    Total Bilirubin 1.8 (*)    GFR calc non Af Amer 37 (*)    GFR calc Af Amer 43 (*)    All other components within normal limits  CBC - Abnormal; Notable for the following:    WBC 14.4 (*)    Hemoglobin 15.3 (*)    All other components within normal limits  BRAIN NATRIURETIC PEPTIDE - Abnormal;  Notable for the following:    B Natriuretic Peptide 1318.1 (*)    All other  components within normal limits  URINALYSIS, ROUTINE W REFLEX MICROSCOPIC (NOT AT [facility])  I-STAT TROPOININ, ED   Imaging Review Dg Chest 2 View  10/27/2015  CLINICAL DATA:  Hypocalcemia, parathyroid surgery last October, coughing, vomiting, question pneumonia, lower extremity edema, history coronary artery disease, atrial fibrillation, diabetes mellitus, COPD, CHF, former smoker EXAM: CHEST  2 VIEW COMPARISON:  10/10/2015 FINDINGS: LEFT subclavian transvenous pacemaker leads project at RIGHT atrium and RIGHT ventricle. Enlargement of cardiac silhouette with minimal pulmonary vascular congestion. Mediastinal contours normal. Bronchitic changes without pulmonary infiltrate, pleural effusion or pneumothorax. Bones demineralized. IMPRESSION: Enlargement of cardiac silhouette post pacemaker. Bronchitic changes without infiltrate. Electronically Signed   By: Lavonia Dana M.D.   On: 10/27/2015 13:47   I have personally reviewed and evaluated these images and lab results as part of my medical decision-making.   EKG Interpretation   Date/Time:  Friday October 27 2015 16:31:41 EST Ventricular Rate:  92 PR Interval:    QRS Duration: 169 QT Interval:  380 QTC Calculation: 470 R Axis:   151 Text Interpretation:  Atrial fibrillation Ventricular premature complex  Right bundle branch block Baseline wander in lead(s) V2 V3 Confirmed by  Jeneen Rinks  MD, [place] ([id number]) on 10/27/2015 4:37:16 PM      MDM   Final diagnoses:  Acute on chronic diastolic congestive heart failure ([place])   Patient non-toxic appearing. Fluid overloaded with SOB on exam. Most likely CHF exacerbation.  Leukocytosis of 14.4. CXR unremarkable. Pending UA, troponin.  BMP elevated at 1318.   The following medications were given in the ED: Medications  calcium gluconate 1 g in sodium chloride 0.9 % 100 mL IVPB (not administered)  furosemide (LASIX) injection 80 mg (not administered)  ipratropium-albuterol (DUONEB) 0.5-2.5 (3) MG/3ML  nebulizer solution 3 mL (not administered)   After discussing case with hospitalist, results for UA demonstrated nitrite positive UTI. Will start patient on rocephin.  Hospitalist agrees to admission. Patient may be safely admitted, and is in understanding and agreement with the plan.   [place], PA-C 10/28/15 2222  Tanna Furry, MD 11/04/15 [phone]

## 2015-10-27 NOTE — H&P (Signed)
[facility] History and Physical  [name] [id number] DOB: Jan 30, 1941 DOA: 10/27/2015  Referring physician: [place] PCP: [name], MD   Chief Complaint: SOB, weight gain  HPI: [name] is a 75 y.o. female  With long PMHx, including non-ischemic cardiomyopathy with EF of 20-25 in 2008, since corrected, morbid obesity, DM, parathyroidectomy.  She was recently at [facility] from 1/10-1/13  where she was treated for acute on chronic CHF, symptomatic hypocalcemia and AF with RVR. Hospitalization was further complicated by AKI. --- she was sent to SNF.  She states they did not weigh her daily.  She developed b/l LE edema, cough, and SOB worsening over the last week.   +chills, + fever, no dysuria  In the ER, her U/A was found to have LE and nitrates, her BNP was elevated from previous.  WBC count was elevated.  Hospitalist were asked to admit for CHF exacerbation and possible UTI     Review of Systems:  All systems reviewed, negative unless stated above   Past Medical History  Diagnosis Date  . Nonischemic cardiomyopathy ([place])     history of,  EF 20-25% at left heart cath in 06/2007; echo 2011 had normal LV function  . Chronic diastolic heart failure ([place])     Echo 06/2010: Moderate LVH, EF 55-65%, normal wall motion, mild MR, moderate to severe LAE, mild RAE.   . Morbid obesity ([place])   . Hyperlipidemia   . Tachy-brady syndrome ([place])     status post implant of a medtronic dual-chamber pacemaker in 2001.  explanted in 2010 -- [place] pacemaker in 2010.  Marland Kitchen Chronic obstructive pulmonary disease ([place])   . Osteoporosis   . Depression   . GERD (gastroesophageal reflux disease)   . Crohn's disease ([place])   . Hypothyroidism     treated  . Seasonal allergies   . Pacemaker     Medtronic  . Atrial fibrillation ([place])     Status post pulmonary vein isolation 2009 at [facility]  . CAD (coronary artery disease)     LHC 05/2007: pLAD 70-80%, pRCA  40%, EF 20-25%. LAD lesion treated medically.   . Coronary artery disease 06/15/2012  . Atrial fibrillation ([place]) 12/18/2007    Annotation: refractory Qualifier: Diagnosis of  By: Doy Mince LPN, Megan    . Morbid obesity with BMI of 40.0-44.9, adult ([place]) 06/22/2012  . Diabetes mellitus ([place]) 06/22/2012  . Diabetes mellitus without complication ([place])   . COPD (chronic obstructive pulmonary disease) ([place])   . Gout   . Dysrhythmia     atrial fib  . Peripheral vascular disease (HCC) 15    rt arm clots  . Obstructive sleep apnea     continuous positive airway pressure not using at present  . Shortness of breath dyspnea     exersion  . CHF (congestive heart failure) ([place])   . On home oxygen therapy     "2L at night" (10/03/2015)   Past Surgical History  Procedure Laterality Date  . Pulmonary vein isolation  05/12/2008    RFCA atrial fibrillation  . Post ablation pseudoaneurysm      at A fib ablation  . Pacemaker insertion  2010    medtronic ADAPTA   . Tonsillectomy    . Embolectomy Right 08/16/2014    Procedure: EMBOLECTOMY- RIGHT BRACHIAL ARTERY;  Surgeon: Serafina Mitchell, MD;  Location: [place];  Service: Vascular;  Laterality: Right;  . Cardiac catheterization  08/2000    noncritical  disease mid RCA, EF preserved  . Cardiac catheterization  05/29/2007    noncritical disease, EF 20-25%  . Cataract extraction w/ intraocular lens  implant, bilateral Bilateral 2014-2016  . Parathyroidectomy Right 01/30/2015    Procedure: PARATHYROIDECTOMY;  Surgeon: Armandina Gemma, MD;  Location: [place];  Service: General;  Laterality: Right;  . Parathyroidectomy Left 10/03/2015    Left inferior parathyroidectomy w/neck exploration  . Parathyroidectomy Left 10/03/2015    Procedure: LEFT PARATHYROID EXPLORATION AND  PARATHYROIDECTOMY;  Surgeon: Armandina Gemma, MD;  Location: [place];  Service: General;  Laterality: Left;   Social History:  reports that she quit smoking about 29 years ago. She quit smokeless tobacco use  about 28 years ago. She reports that she does not drink alcohol or use illicit drugs.  No Known Allergies  Family History  Problem Relation Age of Onset  . Breast cancer Mother   . Heart disease Father   . Emphysema Father     Prior to Admission medications   Medication Sig Start Date End Date Taking? Authorizing Provider  albuterol (PROVENTIL HFA;VENTOLIN HFA) 108 (90 BASE) MCG/ACT inhaler Inhale 2 puffs into the lungs every 6 (six) hours as needed for wheezing or shortness of breath.   Yes Historical Provider, MD  atorvastatin (LIPITOR) 80 MG tablet Take 80 mg by mouth at bedtime.     Yes Historical Provider, MD  calcium carbonate (OS-CAL - DOSED IN MG OF ELEMENTAL CALCIUM) 1250 (500 Ca) MG tablet Take 2 tablets (1,000 mg of elemental calcium total) by mouth 3 (three) times daily with meals. 10/13/15  Yes Nishant Dhungel, MD  colchicine 0.6 MG tablet Take 1-2 tablets by mouth daily as needed (gout). TAKE 2 TABLETS BY MOUTH AS ONE DOSE, THEN 1 TABLET IN 1 HOUR FOR ACUTE GOUT EPISODE 08/09/15  Yes Historical Provider, MD  diphenhydramine-acetaminophen (TYLENOL PM) 25-500 MG TABS tablet Take 2 tablets by mouth at bedtime as needed (sleep/pain).   Yes Historical Provider, MD  ELIQUIS 5 MG TABS tablet TAKE 1 TABLET BY MOUTH TWICE A DAY 08/21/15  Yes Deboraha Sprang, MD  Fiber CAPS Take 1 tablet by mouth at bedtime.    Yes Historical Provider, MD  Fluticasone-Salmeterol (ADVAIR) 250-50 MCG/DOSE AEPB Inhale 1 puff into the lungs every 12 (twelve) hours. 01/17/15  Yes Kathee Delton, MD  folic acid (FOLVITE) 1 MG tablet Take 1 mg by mouth daily. 06/01/15  Yes Historical Provider, MD  furosemide (LASIX) 40 MG tablet TAKE 80 MG BY MOUTH TWICE A DAY 10/13/15  Yes Nishant Dhungel, MD  insulin glargine (LANTUS) 100 UNIT/ML injection Inject 0.2 mLs (20 Units total) into the skin every morning. 10/13/15  Yes Nishant Dhungel, MD  ipratropium-albuterol (DUONEB) 0.5-2.5 (3) MG/3ML SOLN Take 3 mLs by nebulization  every 6 (six) hours as needed (wheezing and or shortness of breath). 02/18/15  Yes Nishant Dhungel, MD  KLOR-CON M20 20 MEQ tablet TAKE 1 TABLET BY MOUTH TWICE DAILY 04/13/15  Yes Deboraha Sprang, MD  levothyroxine (SYNTHROID, LEVOTHROID) 75 MCG tablet Take 75 mcg by mouth daily. 01/20/15  Yes Historical Provider, MD  losartan (COZAAR) 50 MG tablet Take 50 mg by mouth at bedtime.  01/16/15  Yes Historical Provider, MD  metoprolol (LOPRESSOR) 100 MG tablet TAKE 1 TABLET (100 MG TOTAL) BY MOUTH 2 (TWO) TIMES DAILY. 12/23/14  Yes Deboraha Sprang, MD  Multiple Vitamins-Minerals (MULTIVITAMIN WITH MINERALS) tablet Take 1 tablet by mouth daily.   Yes Historical Provider, MD  omeprazole (PRILOSEC OTC)  20 MG tablet Take 20 mg by mouth daily.     Yes Historical Provider, MD  RANEXA 500 MG 12 hr tablet Take 1 tablet by mouth 2 (two) times daily. 03/14/15  Yes Historical Provider, MD  sulfaSALAzine (AZULFIDINE) 500 MG tablet Take 1,000 mg by mouth 2 (two) times daily. 03/16/15  Yes Historical Provider, MD  Tiotropium Bromide Monohydrate (SPIRIVA RESPIMAT) 2.5 MCG/ACT AERS Inhale 2 puffs into the lungs daily. 03/16/15  Yes Collene Gobble, MD  tiZANidine (ZANAFLEX) 4 MG tablet Take 4 mg by mouth every 8 (eight) hours as needed for muscle spasms.  09/13/15  Yes Historical Provider, MD  Vitamin D, Ergocalciferol, (DRISDOL) 50000 units CAPS capsule Take 50,000 Units by mouth every Sunday. 07/28/15  Yes Historical Provider, MD  HYDROcodone-acetaminophen (NORCO/VICODIN) 5-325 MG tablet Take 2 tablets by mouth every 6 (six) hours as needed for moderate pain. Patient not taking: Reported on 10/27/2015 10/13/15   Nishant Dhungel, MD  nitroGLYCERIN (NITROSTAT) 0.4 MG SL tablet Place 1 tablet (0.4 mg total) under the tongue every 5 (five) minutes as needed for chest pain. 07/07/14 10/10/15  Deboraha Sprang, MD   Physical Exam: Filed Vitals:   10/27/15 1600 10/27/15 1630 10/27/15 1635 10/27/15 1645  BP: 119/83 102/81  110/89  Pulse:  95 89  91  Temp:   100.1 F (37.8 C)   TempSrc:   Rectal   Resp: 30 23  25   Weight:      SpO2: 95% 93%  96%    Wt Readings from Last 3 Encounters:  10/27/15 123.378 kg (272 lb)  10/13/15 123.424 kg (272 lb 1.6 oz)  10/04/15 127.3 kg (280 lb 10.3 oz)    General:  Appears calm and comfortable, morbidly obese Eyes: PERRL, normal lids, irises & conjunctiva ENT: grossly normal hearing, lips & tongue Neck: no LAD, masses or thyromegaly Cardiovascular: irr, no m/r/g. some LE edema- not pitting Respiratory: no wheezing, crackles b/l Abdomen: soft, ntnd Skin: no rash or induration seen on limited exam Musculoskeletal: grossly normal tone BUE/BLE Psychiatric: grossly normal mood and affect, speech fluent and appropriate Neurologic: grossly non-focal.          Labs on Admission:  Basic Metabolic Panel:  Recent Labs Lab 10/27/15 1317  NA 138  K 4.1  CL 100*  CO2 28  GLUCOSE 144*  BUN 19  CREATININE 1.35*  CALCIUM 7.2*   Liver Function Tests:  Recent Labs Lab 10/27/15 1317  AST 31  ALT 27  ALKPHOS 190*  BILITOT 1.8*  PROT 7.1  ALBUMIN 2.7*   No results for input(s): LIPASE, AMYLASE in the last 168 hours. No results for input(s): AMMONIA in the last 168 hours. CBC:  Recent Labs Lab 10/27/15 1317  WBC 14.4*  HGB 15.3*  HCT 45.9  MCV 98.1  PLT 387   Cardiac Enzymes: No results for input(s): CKTOTAL, CKMB, CKMBINDEX, TROPONINI in the last 168 hours.  BNP (last 3 results)  Recent Labs  02/15/15 1129 03/28/15 1857 10/27/15 1317  BNP 1515.5* 224.0* 1318.1*    ProBNP (last 3 results) No results for input(s): PROBNP in the last 8760 hours.  CBG: No results for input(s): GLUCAP in the last 168 hours.  Radiological Exams on Admission: Dg Chest 2 View  10/27/2015  CLINICAL DATA:  Hypocalcemia, parathyroid surgery last October, coughing, vomiting, question pneumonia, lower extremity edema, history coronary artery disease, atrial fibrillation, diabetes  mellitus, COPD, CHF, former smoker EXAM: CHEST  2 VIEW COMPARISON:  10/10/2015 FINDINGS: LEFT  subclavian transvenous pacemaker leads project at RIGHT atrium and RIGHT ventricle. Enlargement of cardiac silhouette with minimal pulmonary vascular congestion. Mediastinal contours normal. Bronchitic changes without pulmonary infiltrate, pleural effusion or pneumothorax. Bones demineralized. IMPRESSION: Enlargement of cardiac silhouette post pacemaker. Bronchitic changes without infiltrate. Electronically Signed   By: Lavonia Dana M.D.   On: 10/27/2015 13:47    EKG: Independently reviewed. A fib-- rate 92  Assessment/Plan Active Problems:   OBESITY-MORBID (>100')   Type 2 diabetes mellitus with diabetic nephropathy (HCC)   Acute on chronic combined systolic and diastolic CHF, NYHA class 3 (HCC)   UTI (lower urinary tract infection)   UTI +chills +fever -urine culture -rocephin  SOB due to CHF exacerbation-- h/o combined systolic and diastolic CHF -echo -lasix BID ? Compliance-- gigantic soda from fast food restaurant at bedside I/os Daily weights -BNP elevated  DM -SSI -continue lantus  Morbid obesity  Leukocytosis -monitor  Hypocalcemia -correct some with albumin -check iCA -given CA gluconate in ER  Code Status: full DVT Prophylaxis: Family Communication: patient Disposition Plan:   Time spent: 75 min  [facility] Pager [phone]

## 2015-10-27 NOTE — ED Notes (Signed)
Lab- [name]- aware of add on BNP

## 2015-10-28 ENCOUNTER — Inpatient Hospital Stay (HOSPITAL_COMMUNITY): Payer: Medicare Other

## 2015-10-28 DIAGNOSIS — N390 Urinary tract infection, site not specified: Secondary | ICD-10-CM

## 2015-10-28 DIAGNOSIS — I5043 Acute on chronic combined systolic (congestive) and diastolic (congestive) heart failure: Principal | ICD-10-CM

## 2015-10-28 DIAGNOSIS — N183 Chronic kidney disease, stage 3 (moderate): Secondary | ICD-10-CM

## 2015-10-28 LAB — BASIC METABOLIC PANEL
Anion gap: 14 (ref 5–15)
Anion gap: 12 (ref 5–15)
BUN: 27 mg/dL — AB (ref 6–20)
BUN: 27 mg/dL — ABNORMAL HIGH (ref 6–20)
CALCIUM: 7 mg/dL — AB (ref 8.9–10.3)
CHLORIDE: 100 mmol/L — AB (ref 101–111)
CO2: 25 mmol/L (ref 22–32)
CO2: 24 mmol/L (ref 22–32)
CREATININE: 1.42 mg/dL — AB (ref 0.44–1.00)
Calcium: 6.8 mg/dL — ABNORMAL LOW (ref 8.9–10.3)
Chloride: 102 mmol/L (ref 101–111)
Creatinine, Ser: 1.47 mg/dL — ABNORMAL HIGH (ref 0.44–1.00)
GFR calc non Af Amer: 35 mL/min — ABNORMAL LOW (ref >60)
GFR, EST AFRICAN AMERICAN: 41 mL/min — AB (ref >60)
GFR, EST AFRICAN AMERICAN: 39 mL/min — AB (ref >60)
GFR, EST NON AFRICAN AMERICAN: 34 mL/min — AB (ref >60)
Glucose, Bld: 119 mg/dL — ABNORMAL HIGH (ref 65–99)
Glucose, Bld: 107 mg/dL — ABNORMAL HIGH (ref 65–99)
POTASSIUM: 4.1 mmol/L (ref 3.5–5.1)
Potassium: 4.4 mmol/L (ref 3.5–5.1)
SODIUM: 138 mmol/L (ref 135–145)
Sodium: 139 mmol/L (ref 135–145)

## 2015-10-28 LAB — CBC
HEMATOCRIT: 41.8 % (ref 36.0–46.0)
Hemoglobin: 14 g/dL (ref 12.0–15.0)
MCH: 32.5 pg (ref 26.0–34.0)
MCHC: 33.5 g/dL (ref 30.0–36.0)
MCV: 97 fL (ref 78.0–100.0)
Platelets: 310 10*3/uL (ref 150–400)
RBC: 4.31 MIL/uL (ref 3.87–5.11)
RDW: 14.4 % (ref 11.5–15.5)
WBC: 9 10*3/uL (ref 4.0–10.5)

## 2015-10-28 LAB — TROPONIN I
TROPONIN I: 0.03 ng/mL (ref <0.031)
TROPONIN I: 0.03 ng/mL (ref <0.031)

## 2015-10-28 LAB — GLUCOSE, CAPILLARY
GLUCOSE-CAPILLARY: 95 mg/dL (ref 65–99)
GLUCOSE-CAPILLARY: 96 mg/dL (ref 65–99)
Glucose-Capillary: 118 mg/dL — ABNORMAL HIGH (ref 65–99)
Glucose-Capillary: 145 mg/dL — ABNORMAL HIGH (ref 65–99)

## 2015-10-28 MED ORDER — ACETAMINOPHEN 325 MG PO TABS
650.0000 mg | ORAL_TABLET | ORAL | Status: DC | PRN
  Administered 2015-10-29 – 2015-10-30 (×2): 650 mg via ORAL
  Filled 2015-10-28 (×2): qty 2

## 2015-10-28 MED ORDER — INSULIN GLARGINE 100 UNIT/ML ~~LOC~~ SOLN
15.0000 [IU] | SUBCUTANEOUS | Status: DC
  Filled 2015-10-28: qty 0.15

## 2015-10-28 MED ORDER — CALCIUM CARBONATE 1250 (500 CA) MG PO TABS
3.0000 | ORAL_TABLET | Freq: Three times a day (TID) | ORAL | Status: DC
  Administered 2015-10-29: 1500 mg via ORAL
  Filled 2015-10-28 (×2): qty 2

## 2015-10-28 MED ORDER — SODIUM CHLORIDE 0.9 % IV SOLN
1.0000 g | Freq: Once | INTRAVENOUS | Status: AC
  Administered 2015-10-28: 1 g via INTRAVENOUS
  Filled 2015-10-28: qty 10

## 2015-10-28 MED ORDER — INSULIN GLARGINE 100 UNIT/ML ~~LOC~~ SOLN
15.0000 [IU] | SUBCUTANEOUS | Status: DC
  Administered 2015-10-29 – 2015-10-30 (×2): 15 [IU] via SUBCUTANEOUS
  Filled 2015-10-28 (×2): qty 0.15

## 2015-10-28 MED ORDER — CALCIUM CARBONATE 1250 (500 CA) MG PO TABS
3.0000 | ORAL_TABLET | Freq: Three times a day (TID) | ORAL | Status: DC
  Administered 2015-10-28: 1500 mg via ORAL
  Filled 2015-10-28: qty 2

## 2015-10-28 MED ORDER — INSULIN ASPART 100 UNIT/ML ~~LOC~~ SOLN
0.0000 [IU] | Freq: Three times a day (TID) | SUBCUTANEOUS | Status: DC
  Administered 2015-10-29: 1 [IU] via SUBCUTANEOUS
  Administered 2015-10-30 – 2015-10-31 (×2): 2 [IU] via SUBCUTANEOUS
  Administered 2015-11-01: 1 [IU] via SUBCUTANEOUS

## 2015-10-28 NOTE — Progress Notes (Signed)
PROGRESS NOTE    [name] [id number] DOB: 05-08-1941 DOA: 10/27/2015 PCP: [name], MD  HPI/Brief narrative 75 year old female patient, PMH of NICM - LVEF has since normalized, Chronic combined systolic and diastolic CHF, morbid obesity, HLD, tachybradycardia syndrome status post PPM, COPD, depression, GERD, hypothyroid, A. fib, DM, CAD, OSA-not on CPAP, chronic respiratory failure on home oxygen 2 L/m at bedtime, recent parathyroidectomy, recent hospitalization 10/10/15-10/13/15 for acute on chronic combined CHF, A. fib with RVR and symptomatic hypocalcemia from recent parathyroidectomy, presented to [facility] ED from SNF on 1/77/17 with complaints of worsening lower extremity edema, nonproductive cough, worsening dyspnea, low-grade fever and chills. Urine microscopy suggested UTI. Patient was admitted for management of decompensated CHF and possible UTI.  Assessment/Plan:   1. Acute on chronic combined systolic and diastolic CHF: 2-D echo 123XX123: Poor acoustic windows. Overall LVEF appeared normal to mildly depressed, moderate LVH. Started on IV Lasix 80 mg twice a day. Strict intake and output charting and daily weights. We will repeat 2-D echo. 2. Urinary tract infection: Continue IV Rocephin pending urine culture results. 3. Type II DM with renal complications: As per RN, patient inadvertently received NovoLog 3 units this morning. CBG is 96 mg/dL. Closely monitor. Otherwise continue Lantus and SSI. 4. Chronic respiratory failure: Continue nightly oxygen at 2 L/m via nasal cannula. 5. Hypocalcemia, status post recent parathyroidectomy: Corrected serum calcium is 8. Replace calcium and follow closely. Asymptomatic. 6. Tachybradycardia syndrome status post PPM. 7. A. fib with controlled ventricular rate: Continue Apixaban and s/p PPM.  8. HLD: Continue statins 9. Hypothyroid: Continue Synthroid 10. Stage III chronic kidney disease: Baseline creatinine is not clear but may be in the  1.2-1.4 range. May be close to baseline. Follow closely.    DVT prophylaxis: on Eliquis Code Status: Full Family Communication: Discussed extensively with patient's son-in-law at bedside.  Disposition Plan: DC to SNF when medically stable. Patient wishes to go to a different one this time. PT evaluation. Social worker consult.  Consultants:  None  Procedures:  None  Antimicrobials:  IV Rocephin 1/27 >   Subjective: Feels slightly better. No chills. Denies dyspnea or cough or chest pain.   Objective: Filed Vitals:   10/28/15 0830 10/28/15 0925 10/28/15 1200 10/28/15 1318  BP:  98/62 107/71 118/78  Pulse:  76 77   Temp:  99.1 F (37.3 C) 98 F (36.7 C)   TempSrc:  Oral Oral   Resp:  20 18   Height:      Weight:      SpO2: 95% 93% 95%     Intake/Output Summary (Last 24 hours) at 10/28/15 1518 Last data filed at 10/28/15 1459  Gross per 24 hour  Intake    945 ml  Output    853 ml  Net     92 ml   Filed Weights   10/27/15 1316 10/27/15 1930 10/28/15 0541  Weight: 123.378 kg (272 lb) 125.6 kg (276 lb 14.4 oz) 125.646 kg (277 lb)    Exam:  General exam: Moderately built and morbidly obese pleasant elderly female lying comfortably propped up in bed.  Respiratory system: Occasional basal crackles but otherwise clear to auscultation. No increased work of breathing. Cardiovascular system: S1 & S2 heard, RRR. No JVD, murmurs, gallops, clicks. Trace bilateral leg edema. Gastrointestinal system: Abdomen is nondistended, soft and nontender. Normal bowel sounds heard. Central nervous system: Alert and oriented. No focal neurological deficits. Extremities: Symmetric 5 x 5 power.   Data Reviewed: Basic Metabolic Panel:  Recent Labs Lab 10/27/15 1317 10/28/15 0147 10/28/15 0348  NA 138 139 138  K 4.1 4.1 4.4  CL 100* 100* 102  CO2 28 25 24   GLUCOSE 144* 119* 107*  BUN 19 27* 27*  CREATININE 1.35* 1.42* 1.47*  CALCIUM 7.2* 6.8* 7.0*   Liver Function  Tests:  Recent Labs Lab 10/27/15 1317  AST 31  ALT 27  ALKPHOS 190*  BILITOT 1.8*  PROT 7.1  ALBUMIN 2.7*   No results for input(s): LIPASE, AMYLASE in the last 168 hours. No results for input(s): AMMONIA in the last 168 hours. CBC:  Recent Labs Lab 10/27/15 1317 10/28/15 0147  WBC 14.4* 9.0  HGB 15.3* 14.0  HCT 45.9 41.8  MCV 98.1 97.0  PLT 387 310   Cardiac Enzymes:  Recent Labs Lab 10/27/15 2031 10/28/15 0147 10/28/15 0740  TROPONINI 0.03 0.03 0.03   BNP (last 3 results) No results for input(s): PROBNP in the last 8760 hours. CBG:  Recent Labs Lab 10/27/15 2200 10/28/15 0631 10/28/15 1211  GLUCAP 169* 95 96    Recent Results (from the past 240 hour(s))  Urine culture     Status: None (Preliminary result)   Collection Time: 10/27/15  8:25 PM  Result Value Ref Range Status   Specimen Description URINE, CLEAN CATCH  Final   Special Requests NONE  Final   Culture TOO YOUNG TO READ  Final   Report Status PENDING  Incomplete         Studies: Dg Chest 2 View  10/27/2015  CLINICAL DATA:  Hypocalcemia, parathyroid surgery last October, coughing, vomiting, question pneumonia, lower extremity edema, history coronary artery disease, atrial fibrillation, diabetes mellitus, COPD, CHF, former smoker EXAM: CHEST  2 VIEW COMPARISON:  10/10/2015 FINDINGS: LEFT subclavian transvenous pacemaker leads project at RIGHT atrium and RIGHT ventricle. Enlargement of cardiac silhouette with minimal pulmonary vascular congestion. Mediastinal contours normal. Bronchitic changes without pulmonary infiltrate, pleural effusion or pneumothorax. Bones demineralized. IMPRESSION: Enlargement of cardiac silhouette post pacemaker. Bronchitic changes without infiltrate. Electronically Signed   By: Lavonia Dana M.D.   On: 10/27/2015 13:47        Scheduled Meds: . apixaban  5 mg Oral BID  . atorvastatin  80 mg Oral QHS  . calcium carbonate  2 tablet Oral TID WC  . cefTRIAXone  (ROCEPHIN)  IV  1 g Intravenous Q24H  . folic acid  1 mg Oral Daily  . furosemide  80 mg Intravenous BID  . insulin aspart  0-15 Units Subcutaneous TID WC  . insulin aspart  0-5 Units Subcutaneous QHS  . insulin glargine  20 Units Subcutaneous BH-q7a  . levothyroxine  75 mcg Oral Daily  . metoprolol  100 mg Oral BID  . mometasone-formoterol  2 puff Inhalation BID  . multivitamin with minerals  1 tablet Oral Daily  . pantoprazole  40 mg Oral Daily  . polycarbophil  625 mg Oral QHS  . potassium chloride SA  20 mEq Oral BID  . ranolazine  500 mg Oral BID  . sodium chloride flush  3 mL Intravenous Q12H  . sulfaSALAzine  1,000 mg Oral BID  . tiotropium  18 mcg Inhalation Daily  . [START ON 10/29/2015] Vitamin D (Ergocalciferol)  50,000 Units Oral Q Sun   Continuous Infusions:   Active Problems:   OBESITY-MORBID (>100')   Type 2 diabetes mellitus with diabetic nephropathy (HCC)   Acute on chronic combined systolic and diastolic CHF, NYHA class 3 (HCC)   UTI (lower  urinary tract infection)    Time spent: 40 minutes.    Vernell Leep, MD, FACP, FHM. [facility] Pager [phone]  If 7PM-7AM, please contact night-coverage [email or web address] Password [email or web address] 10/28/2015, 3:18 PM    LOS: 1 day

## 2015-10-28 NOTE — Discharge Instructions (Signed)

## 2015-10-29 ENCOUNTER — Other Ambulatory Visit (HOSPITAL_COMMUNITY): Payer: Self-pay

## 2015-10-29 DIAGNOSIS — I5033 Acute on chronic diastolic (congestive) heart failure: Secondary | ICD-10-CM

## 2015-10-29 LAB — GLUCOSE, CAPILLARY
GLUCOSE-CAPILLARY: 138 mg/dL — AB (ref 65–99)
Glucose-Capillary: 105 mg/dL — ABNORMAL HIGH (ref 65–99)
Glucose-Capillary: 115 mg/dL — ABNORMAL HIGH (ref 65–99)
Glucose-Capillary: 128 mg/dL — ABNORMAL HIGH (ref 65–99)

## 2015-10-29 LAB — COMPREHENSIVE METABOLIC PANEL
ALK PHOS: 159 U/L — AB (ref 38–126)
ALT: 22 U/L (ref 14–54)
AST: 28 U/L (ref 15–41)
Albumin: 2.3 g/dL — ABNORMAL LOW (ref 3.5–5.0)
Anion gap: 13 (ref 5–15)
BILIRUBIN TOTAL: 0.8 mg/dL (ref 0.3–1.2)
BUN: 33 mg/dL — AB (ref 6–20)
CHLORIDE: 97 mmol/L — AB (ref 101–111)
CO2: 27 mmol/L (ref 22–32)
CREATININE: 1.42 mg/dL — AB (ref 0.44–1.00)
Calcium: 7.3 mg/dL — ABNORMAL LOW (ref 8.9–10.3)
GFR calc Af Amer: 41 mL/min — ABNORMAL LOW (ref >60)
GFR, EST NON AFRICAN AMERICAN: 35 mL/min — AB (ref >60)
Glucose, Bld: 127 mg/dL — ABNORMAL HIGH (ref 65–99)
Potassium: 4.2 mmol/L (ref 3.5–5.1)
Sodium: 137 mmol/L (ref 135–145)
TOTAL PROTEIN: 6.1 g/dL — AB (ref 6.5–8.1)

## 2015-10-29 LAB — CALCIUM, IONIZED: CALCIUM, IONIZED, SERUM: 3.8 mg/dL — AB (ref 4.5–5.6)

## 2015-10-29 MED ORDER — DEXTROSE 5 % IV SOLN
1.0000 g | INTRAVENOUS | Status: DC
  Administered 2015-10-30 – 2015-10-31 (×2): 1 g via INTRAVENOUS
  Filled 2015-10-29 (×3): qty 10

## 2015-10-29 MED ORDER — CALCIUM CARBONATE 1250 (500 CA) MG PO TABS
4.0000 | ORAL_TABLET | Freq: Three times a day (TID) | ORAL | Status: DC
  Administered 2015-10-29 – 2015-11-01 (×9): 2000 mg via ORAL
  Filled 2015-10-29 (×9): qty 2

## 2015-10-29 MED ORDER — ADULT MULTIVITAMIN W/MINERALS CH
1.0000 | ORAL_TABLET | Freq: Every day | ORAL | Status: DC
Start: 2015-10-29 — End: 2015-11-01
  Administered 2015-10-29 – 2015-11-01 (×4): 1 via ORAL
  Filled 2015-10-29 (×4): qty 1

## 2015-10-29 NOTE — Evaluation (Signed)
Physical Therapy Evaluation Patient Details Name: [name] MRN: [id number] DOB: 1941-05-17 Today's Date: 10/29/2015   History of Present Illness  75 year old female patient, PMH of NICM - LVEF has since normalized, Chronic combined systolic and diastolic CHF, morbid obesity, HLD, tachybradycardia syndrome status post PPM, COPD, depression, GERD, hypothyroid, A. fib, DM, CAD, OSA-not on CPAP, chronic respiratory failure on home oxygen 2 L/m at bedtime, recent parathyroidectomy, recent hospitalization 10/10/15-10/13/15 for acute on chronic combined CHF, A. fib with RVR and symptomatic hypocalcemia from recent parathyroidectomy, presented to [facility] ED from SNF on 1/77/17 with complaints of worsening lower extremity edema, nonproductive cough, worsening dyspnea, low-grade fever and chills. Urine microscopy suggested UTI. Patient was admitted for management of decompensated CHF and possible UTI.  Clinical Impression  Pt admitted with above diagnosis. Pt currently with functional limitations due to the deficits listed below (see PT Problem List). Pt was able to ambulate short distances but with poor safety with RW.  Will need SNF at d/c to gain strength and endurance prior to d/c home.  Pt requesting [facility] for therapy.  Will follow acutely.   Pt will benefit from skilled PT to increase their independence and safety with mobility to allow discharge to the venue listed below.      Follow Up Recommendations SNF;Supervision/Assistance - 24 hour    Equipment Recommendations  Other (comment) (TBA)    Recommendations for Other Services       Precautions / Restrictions Precautions Precautions: Fall Precaution Comments: obesity Restrictions Weight Bearing Restrictions: No      Mobility  Bed Mobility Overal bed mobility: Needs Assistance Bed Mobility: Sit to Supine       Sit to supine: Min guard   General bed mobility comments: Pt tends to throw her body around. Moves quickly. close guard  for safety  Transfers Overall transfer level: Needs assistance Equipment used: Rolling walker (2 wheeled) Transfers: Sit to/from [facility] Sit to Stand: Min assist Stand pivot transfers: Min assist       General transfer comment: Pt needed cues for safety.  needed cues for hand placment.  Stood to [employer] with poor safety as she either pushes RW out too far in front of her or leans on it on her elbows with poor safety.  Took a few steps to 3N1 and then could not clean herself.  PT cleaned pt and applied barrier cream as she has toilet paper stuck and had red areas.  Then pt walked a few steps to bed and plopped onto bed.    Ambulation/Gait Ambulation/Gait assistance: Min assist Ambulation Distance (Feet): 4 Feet Assistive device: Rolling walker (2 wheeled) Gait Pattern/deviations: Step-to pattern;Decreased stride length;Trunk flexed;Wide base of support   Gait velocity interpretation: Below normal speed for age/gender General Gait Details: Assist to stabilize pt and RW, especially when taking steps backward.   Pt leaning down on RW as she fatigued and would not listen to cues.   Stairs            Wheelchair Mobility    Modified Rankin (Stroke Patients Only)       Balance Overall balance assessment: Needs assistance Sitting-balance support: No upper extremity supported;Feet supported Sitting balance-Leahy Scale: Fair     Standing balance support: Bilateral upper extremity supported;During functional activity Standing balance-Leahy Scale: Poor Standing balance comment: relies on UE support.  Pertinent Vitals/Pain Pain Assessment: Faces Faces Pain Scale: Hurts little more Pain Location: all over Pain Descriptors / Indicators: Aching Pain Intervention(s): Limited activity within patient's tolerance;Monitored during session;Repositioned  VSS    Home Living Family/patient expects to be discharged to:: Private  residence Living Arrangements: Alone Available Help at Discharge: Family;Available PRN/intermittently Type of Home: House Home Access: Stairs to enter Entrance Stairs-Rails: None Entrance Stairs-Number of Steps: 1 Home Layout: One level Home Equipment: Walker - 2 wheels;Walker - 4 wheels;Tub bench;Shower seat;Grab bars - toilet;Wheelchair - manual;Hospital bed (lift chair) Additional Comments: Pt has been in [place] for two weeks but got sick with CHF and UTI.  Wants to go to [place].      Prior Function Level of Independence: Independent with assistive device(s)         Comments: pt reports difficulty with standing (limited tolerance); pt has tub bench and another shower seat that she uses; pt uses stool in the kitchen d/t limited standing tolerance; pt has comfort ht toilets     Hand Dominance        Extremity/Trunk Assessment   Upper Extremity Assessment: Defer to OT evaluation           Lower Extremity Assessment: Generalized weakness      Cervical / Trunk Assessment: Kyphotic  Communication   Communication: HOH  Cognition Arousal/Alertness: Awake/alert Behavior During Therapy: Flat affect Overall Cognitive Status: Within Functional Limits for tasks assessed                      General Comments General comments (skin integrity, edema, etc.): Placed barrier cream on pts buttocks as she was red.    Exercises        Assessment/Plan    PT Assessment Patient needs continued PT services  PT Diagnosis Difficulty walking   PT Problem List Decreased activity tolerance;Decreased balance;Decreased mobility;Decreased knowledge of use of DME;Decreased safety awareness  PT Treatment Interventions Gait training;Functional mobility training;Therapeutic activities;Therapeutic exercise;Balance training   PT Goals (Current goals can be found in the Care Plan section) Acute Rehab PT Goals Patient Stated Goal: To walk better PT Goal Formulation: With  patient Time For Goal Achievement: 11/12/15 Potential to Achieve Goals: Good    Frequency Min 2X/week   Barriers to discharge Decreased caregiver support      Co-evaluation               End of Session Equipment Utilized During Treatment: Gait belt Activity Tolerance: Patient limited by fatigue Patient left: in bed;with call bell/phone within reach;with bed alarm set Nurse Communication: Mobility status         Time: 1417-1440 PT Time Calculation (min) (ACUTE ONLY): 23 min   Charges:   PT Evaluation $PT Eval Moderate Complexity: 1 Procedure PT Treatments $Gait Training: 8-22 mins   PT G CodesIrwin Brakeman F 12-Nov-2015, 3:19 PM [employer] Acute Rehabilitation [phone] (pager)

## 2015-10-29 NOTE — Progress Notes (Signed)
PROGRESS NOTE    [name] [id number] DOB: 1941/03/04 DOA: 10/27/2015 PCP: [name], MD  HPI/Brief narrative 75 year old female patient, PMH of NICM - LVEF has since normalized, Chronic combined systolic and diastolic CHF, morbid obesity, HLD, tachybradycardia syndrome status post PPM, COPD, depression, GERD, hypothyroid, A. fib, DM, CAD, OSA-not on CPAP, chronic respiratory failure on home oxygen 2 L/m at bedtime, recent parathyroidectomy, recent hospitalization 10/10/15-10/13/15 for acute on chronic combined CHF, A. fib with RVR and symptomatic hypocalcemia from recent parathyroidectomy, presented to [facility] ED from SNF on 1/77/17 with complaints of worsening lower extremity edema, nonproductive cough, worsening dyspnea, low-grade fever and chills. Urine microscopy suggested UTI. Patient was admitted for management of decompensated CHF and possible UTI.  Assessment/Plan:   1. Acute on chronic combined systolic and diastolic CHF: 2-D echo 123XX123: Poor acoustic windows. Overall LVEF appeared normal to mildly depressed, moderate LVH. Started on IV Lasix 80 mg twice a day. We will repeat 2-D echo. - 280 mL since admission (not sure if accurate), patient states that she's been urinating well since IV Lasix. Weight also seems inaccurate (increased from 272 pounds to 278 pounds since admission). Requested nursing for strict intake output and daily standing weights. Clinically improving. Continue current Lasix 80 mg IV twice a day. 2. Urinary tract infection: Continue IV Rocephin pending urine culture results. 3. Type II DM with renal complications: continue slightly reduced dose Lantus and SSI. On 1/28, patient inadvertently received NovoLog 3 units in the morning by RN without adverse effects. Patient was updated and was apologized to. 4. Chronic respiratory failure: Continue nightly oxygen at 2 L/m via nasal cannula. 5. Hypocalcemia, status post recent parathyroidectomy: Corrected serum  calcium is improved from 8 > 8.7. Replace calcium and follow closely. Asymptomatic. Will increase oral calcium supplementation. 6. Tachybradycardia syndrome status post PPM. 7. A. fib with controlled ventricular rate: Continue Apixaban and s/p PPM.  8. HLD: Continue statins 9. Hypothyroid: Continue Synthroid 10. Stage III chronic kidney disease: Baseline creatinine is not clear but may be in the 1.2-1.4 range. May be close to baseline. Follow closely. Stable.    DVT prophylaxis: on Eliquis Code Status: Full Family Communication: Discussed extensively with patient's son-in-law at bedside.  Disposition Plan: DC to SNF when medically stable. Patient wishes to go to a different one this time. PT evaluation. Social worker consult. Possible DC in 2-3 days.  Consultants:  None  Procedures:  None  Antimicrobials:  IV Rocephin 1/27 >   Subjective: Feels better. Denies dyspnea. Leg swelling decreasing. Urinating well. Had a BM.  Objective: Filed Vitals:   10/29/15 0433 10/29/15 0800 10/29/15 0944 10/29/15 1041  BP: 116/67 126/89  133/77  Pulse: 93 82 83 80  Temp: 98.5 F (36.9 C)   99.2 F (37.3 C)  TempSrc: Oral   Oral  Resp: 18  16   Height:      Weight: 126.1 kg (278 lb)     SpO2: 96%  94%     Intake/Output Summary (Last 24 hours) at 10/29/15 1050 Last data filed at 10/29/15 0928  Gross per 24 hour  Intake    922 ml  Output   1000 ml  Net    -78 ml   Filed Weights   10/27/15 1930 10/28/15 0541 10/29/15 0433  Weight: 125.6 kg (276 lb 14.4 oz) 125.646 kg (277 lb) 126.1 kg (278 lb)    Exam:  General exam: Moderately built and morbidly obese pleasant elderly female sitting up comfortably in chair  this morning Respiratory system: clear to auscultation. No increased work of breathing. Cardiovascular system: S1 & S2 heard, RRR. No JVD, murmurs, gallops, clicks. Trace bilateral leg edema. Telemetry: A. fib with BBB morphology and occasional PVCs and trigeminy night.  Occasional pacemaker spikes. Gastrointestinal system: Abdomen is nondistended, soft and nontender. Normal bowel sounds heard. Central nervous system: Alert and oriented. No focal neurological deficits. Extremities: Symmetric 5 x 5 power.   Data Reviewed: Basic Metabolic Panel:  Recent Labs Lab 10/27/15 1317 10/28/15 0147 10/28/15 0348 10/29/15 0358  NA 138 139 138 137  K 4.1 4.1 4.4 4.2  CL 100* 100* 102 97*  CO2 28 25 24 27   GLUCOSE 144* 119* 107* 127*  BUN 19 27* 27* 33*  CREATININE 1.35* 1.42* 1.47* 1.42*  CALCIUM 7.2* 6.8* 7.0* 7.3*   Liver Function Tests:  Recent Labs Lab 10/27/15 1317 10/29/15 0358  AST 31 28  ALT 27 22  ALKPHOS 190* 159*  BILITOT 1.8* 0.8  PROT 7.1 6.1*  ALBUMIN 2.7* 2.3*   No results for input(s): LIPASE, AMYLASE in the last 168 hours. No results for input(s): AMMONIA in the last 168 hours. CBC:  Recent Labs Lab 10/27/15 1317 10/28/15 0147  WBC 14.4* 9.0  HGB 15.3* 14.0  HCT 45.9 41.8  MCV 98.1 97.0  PLT 387 310   Cardiac Enzymes:  Recent Labs Lab 10/27/15 2031 10/28/15 0147 10/28/15 0740  TROPONINI 0.03 0.03 0.03   BNP (last 3 results) No results for input(s): PROBNP in the last 8760 hours. CBG:  Recent Labs Lab 10/28/15 0631 10/28/15 1211 10/28/15 1655 10/28/15 2138 10/29/15 0605  GLUCAP 95 96 118* 145* 105*    Recent Results (from the past 240 hour(s))  Urine culture     Status: None (Preliminary result)   Collection Time: 10/27/15  8:25 PM  Result Value Ref Range Status   Specimen Description URINE, CLEAN CATCH  Final   Special Requests NONE  Final   Culture TOO YOUNG TO READ  Final   Report Status PENDING  Incomplete         Studies: Dg Chest 2 View  10/27/2015  CLINICAL DATA:  Hypocalcemia, parathyroid surgery last October, coughing, vomiting, question pneumonia, lower extremity edema, history coronary artery disease, atrial fibrillation, diabetes mellitus, COPD, CHF, former smoker EXAM: CHEST  2  VIEW COMPARISON:  10/10/2015 FINDINGS: LEFT subclavian transvenous pacemaker leads project at RIGHT atrium and RIGHT ventricle. Enlargement of cardiac silhouette with minimal pulmonary vascular congestion. Mediastinal contours normal. Bronchitic changes without pulmonary infiltrate, pleural effusion or pneumothorax. Bones demineralized. IMPRESSION: Enlargement of cardiac silhouette post pacemaker. Bronchitic changes without infiltrate. Electronically Signed   By: Lavonia Dana M.D.   On: 10/27/2015 13:47        Scheduled Meds: . apixaban  5 mg Oral BID  . atorvastatin  80 mg Oral QHS  . calcium carbonate  3 tablet Oral TID WC  . cefTRIAXone (ROCEPHIN)  IV  1 g Intravenous Q24H  . folic acid  1 mg Oral Daily  . furosemide  80 mg Intravenous BID  . insulin aspart  0-9 Units Subcutaneous TID WC  . insulin glargine  15 Units Subcutaneous BH-q7a  . levothyroxine  75 mcg Oral Daily  . metoprolol  100 mg Oral BID  . mometasone-formoterol  2 puff Inhalation BID  . multivitamin with minerals  1 tablet Oral Daily  . pantoprazole  40 mg Oral Daily  . polycarbophil  625 mg Oral QHS  . potassium chloride  SA  20 mEq Oral BID  . ranolazine  500 mg Oral BID  . sodium chloride flush  3 mL Intravenous Q12H  . sulfaSALAzine  1,000 mg Oral BID  . tiotropium  18 mcg Inhalation Daily  . Vitamin D (Ergocalciferol)  50,000 Units Oral Q Sun   Continuous Infusions:   Active Problems:   OBESITY-MORBID (>100')   Type 2 diabetes mellitus with diabetic nephropathy (HCC)   Acute on chronic combined systolic and diastolic CHF, NYHA class 3 (HCC)   UTI (lower urinary tract infection)    Time spent: 20 minutes.    Vernell Leep, MD, FACP, FHM. [facility] Pager [phone]  If 7PM-7AM, please contact night-coverage [email or web address] Password [email or web address] 10/29/2015, 10:50 AM    LOS: 2 days

## 2015-10-30 ENCOUNTER — Ambulatory Visit (HOSPITAL_COMMUNITY): Payer: Medicare Other

## 2015-10-30 DIAGNOSIS — I509 Heart failure, unspecified: Secondary | ICD-10-CM

## 2015-10-30 LAB — RENAL FUNCTION PANEL
Albumin: 2.3 g/dL — ABNORMAL LOW (ref 3.5–5.0)
Anion gap: 13 (ref 5–15)
BUN: 28 mg/dL — ABNORMAL HIGH (ref 6–20)
CHLORIDE: 98 mmol/L — AB (ref 101–111)
CO2: 28 mmol/L (ref 22–32)
Calcium: 7.5 mg/dL — ABNORMAL LOW (ref 8.9–10.3)
Creatinine, Ser: 1.21 mg/dL — ABNORMAL HIGH (ref 0.44–1.00)
GFR calc Af Amer: 49 mL/min — ABNORMAL LOW (ref >60)
GFR, EST NON AFRICAN AMERICAN: 43 mL/min — AB (ref >60)
Glucose, Bld: 87 mg/dL (ref 65–99)
POTASSIUM: 4 mmol/L (ref 3.5–5.1)
Phosphorus: 3.8 mg/dL (ref 2.5–4.6)
SODIUM: 139 mmol/L (ref 135–145)

## 2015-10-30 LAB — URINE CULTURE

## 2015-10-30 LAB — GLUCOSE, CAPILLARY
GLUCOSE-CAPILLARY: 70 mg/dL (ref 65–99)
GLUCOSE-CAPILLARY: 121 mg/dL — AB (ref 65–99)
Glucose-Capillary: 167 mg/dL — ABNORMAL HIGH (ref 65–99)
Glucose-Capillary: 222 mg/dL — ABNORMAL HIGH (ref 65–99)

## 2015-10-30 MED ORDER — INSULIN GLARGINE 100 UNIT/ML ~~LOC~~ SOLN
12.0000 [IU] | SUBCUTANEOUS | Status: DC
  Administered 2015-10-31 – 2015-11-01 (×2): 12 [IU] via SUBCUTANEOUS
  Filled 2015-10-30 (×3): qty 0.12

## 2015-10-30 MED ORDER — METOLAZONE 2.5 MG PO TABS
2.5000 mg | ORAL_TABLET | Freq: Once | ORAL | Status: AC
  Administered 2015-10-30: 2.5 mg via ORAL
  Filled 2015-10-30: qty 1

## 2015-10-30 NOTE — NC FL2 (Signed)
[place] LEVEL OF CARE SCREENING TOOL     IDENTIFICATION  Patient Name: [name] Birthdate: [birth year] Sex: female Admission Date (Current Location): 10/27/2015  [facility] and [place] Number:  Herbalist and Address:  [facility] [place], [place] [address], [place], [place] [address]      Provider Number: [id number]  Attending Physician Name and Address:  Modena Jansky, MD  Relative Name and Phone Number:       Current Level of Care: Hospital Recommended Level of Care: [place] Prior Approval Number:    Date Approved/Denied:   PASRR Number: [id number]  Discharge Plan: SNF    Current Diagnoses: Patient Active Problem List   Diagnosis Date Noted  . Acute exacerbation of CHF (congestive heart failure) ([place]) 10/27/2015  . UTI (lower urinary tract infection) 10/27/2015  . AKI (acute kidney injury) ([place]) 10/16/2015  . Hypomagnesemia 10/13/2015  . Malnutrition of moderate degree 10/11/2015  . Hypocalcemia 10/10/2015  . Acute on chronic combined systolic and diastolic CHF, NYHA class 3 ([place]) 10/10/2015  . COPD, moderate ([place]) 04/25/2015  . Fever 03/28/2015  . Near syncope 03/28/2015  . Uncomplicated asthma [id number]  . CKD (chronic kidney disease) stage 3, GFR 30-59 ml/min 03/13/2015  . Chronic obstructive pulmonary disease ([place]) 03/13/2015  . Crohn's disease of large intestine without complication ([place]) [id number]  . Gastro-esophageal reflux disease without esophagitis 03/13/2015  . Personal history of infectious and parasitic disease 03/13/2015  . Gout 03/13/2015  . Hypertensive heart disease with CHF (congestive heart failure) ([place]) 03/13/2015  . Hypothyroidism 03/13/2015  . Low back pain 03/13/2015  . Extreme obesity ([place]) 03/13/2015  . Paroxysmal atrial fibrillation ([place]) 03/13/2015  . Osteopenia 03/13/2015  . Primary hyperparathyroidism ([place]) 03/13/2015  . Secondary polycythemia 03/13/2015  . Sleep  apnea 03/13/2015  . Type 2 diabetes mellitus with diabetic nephropathy ([place]) 03/13/2015  . COPD exacerbation ([place]) 02/18/2015  . OSA (obstructive sleep apnea) 02/18/2015  . Pulmonary hypertension due to sleep-disordered breathing ([place]) 02/18/2015  . Acute on chronic respiratory failure with hypoxia ([place]) 02/18/2015  . Hyperparathyroidism, primary ([place]) 01/30/2015  . Obesity 11/10/2014  . Ischemia of hand 08/15/2014  . Morbid obesity with BMI of 40.0-44.9, adult ([place]) 06/22/2012  . Coronary artery disease 06/15/2012  . Long term current use of anticoagulant therapy 01/11/2011  . CARDIAC PACEMAKER IN SITU-MEDTRONIC ADAPTA L 01/15/2010  . DIASTOLIC HEART FAILURE, CHRONIC 12/22/2008  . DYSLIPIDEMIA 09/05/2008  . OBESITY-MORBID (>100') 09/05/2008  . OTHER OSTEOPOROSIS 09/05/2008  . CROHN'S DISEASE, HX OF 09/05/2008  . COPD with emphysema ([place]) 01/02/2008  . HYPERSOMNIA 12/21/2007  . ANXIETY 12/18/2007  . DEPRESSION 12/18/2007  . Atrial fibrillation ([place]) 12/18/2007    Orientation RESPIRATION BLADDER Height & Weight     Self, Time, Situation, Place  Normal Continent Weight: 275 lb 14.4 oz (125.147 kg) (a scale) Height:  5\' 11"  (180.3 cm)  BEHAVIORAL SYMPTOMS/MOOD NEUROLOGICAL BOWEL NUTRITION STATUS      Continent    AMBULATORY STATUS COMMUNICATION OF NEEDS Skin   Limited Assist Verbally                         Personal Care Assistance Level of Assistance  Dressing, Bathing, Feeding Bathing Assistance: Limited assistance Feeding assistance: Limited assistance Dressing Assistance: Limited assistance     Functional Limitations Info  Sight Sight Info: Impaired        SPECIAL CARE FACTORS FREQUENCY  PT (By licensed  PT), OT (By licensed OT)     PT Frequency: 5 OT Frequency: 5            Contractures      Additional Factors Info  Code Status, Allergies Code Status Info: FULL Allergies Info: NKA     Isolation Precautions Info: none     Current Medications  (10/30/2015):  This is the current hospital active medication list Current Facility-Administered Medications  Medication Dose Route Frequency Provider Last Rate Last Dose  . 0.9 %  sodium chloride infusion  250 mL Intravenous PRN Geradine Girt, DO      . acetaminophen (TYLENOL) tablet 650 mg  650 mg Oral Q4H PRN Modena Jansky, MD   650 mg at 10/29/15 2159  . albuterol (PROVENTIL) (2.5 MG/3ML) 0.083% nebulizer solution 3 mL  3 mL Inhalation Q6H PRN Geradine Girt, DO      . apixaban (ELIQUIS) tablet 5 mg  5 mg Oral BID Geradine Girt, DO   5 mg at 10/30/15 1030  . atorvastatin (LIPITOR) tablet 80 mg  80 mg Oral QHS Geradine Girt, DO   80 mg at 10/29/15 2158  . calcium carbonate (OS-CAL - dosed in mg of elemental calcium) tablet 2,000 mg of elemental calcium  4 tablet Oral TID WC Modena Jansky, MD   2,000 mg of elemental calcium at 10/30/15 1714  . cefTRIAXone (ROCEPHIN) 1 g in dextrose 5 % 50 mL IVPB  1 g Intravenous Q24H Modena Jansky, MD   1 g at 10/30/15 1714  . folic acid (FOLVITE) tablet 1 mg  1 mg Oral Daily Geradine Girt, DO   1 mg at 10/30/15 1031  . furosemide (LASIX) injection 80 mg  80 mg Intravenous BID Geradine Girt, DO   80 mg at 10/30/15 1714  . insulin aspart (novoLOG) injection 0-9 Units  0-9 Units Subcutaneous TID WC Modena Jansky, MD   2 Units at 10/30/15 1213  . [START ON 10/31/2015] insulin glargine (LANTUS) injection 12 Units  12 Units Subcutaneous BH-q7a Modena Jansky, MD      . levothyroxine (SYNTHROID, LEVOTHROID) tablet 75 mcg  75 mcg Oral Daily Geradine Girt, DO   75 mcg at 10/30/15 1030  . metoprolol (LOPRESSOR) tablet 100 mg  100 mg Oral BID Modena Jansky, MD   100 mg at 10/30/15 1031  . mometasone-formoterol (DULERA) 100-5 MCG/ACT inhaler 2 puff  2 puff Inhalation BID Geradine Girt, DO   2 puff at 10/30/15 0929  . multivitamin with minerals tablet 1 tablet  1 tablet Oral Daily Modena Jansky, MD   1 tablet at 10/30/15 1031  . ondansetron (ZOFRAN)  injection 4 mg  4 mg Intravenous Q6H PRN Geradine Girt, DO      . pantoprazole (PROTONIX) EC tablet 40 mg  40 mg Oral Daily Jessica U Vann, DO   40 mg at 10/30/15 1000  . polycarbophil (FIBERCON) tablet 625 mg  625 mg Oral QHS Geradine Girt, DO   625 mg at 10/29/15 2159  . potassium chloride SA (K-DUR,KLOR-CON) CR tablet 20 mEq  20 mEq Oral BID Geradine Girt, DO   20 mEq at 10/30/15 1030  . ranolazine (RANEXA) 12 hr tablet 500 mg  500 mg Oral BID Geradine Girt, DO   500 mg at 10/30/15 1031  . sodium chloride flush (NS) 0.9 % injection 3 mL  3 mL Intravenous Q12H Geradine Girt, DO  3 mL at 10/30/15 1000  . sodium chloride flush (NS) 0.9 % injection 3 mL  3 mL Intravenous PRN Geradine Girt, DO      . sulfaSALAzine (AZULFIDINE) tablet 1,000 mg  1,000 mg Oral BID Geradine Girt, DO   1,000 mg at 10/29/15 2159  . tiotropium (SPIRIVA) inhalation capsule 18 mcg  18 mcg Inhalation Daily Geradine Girt, DO   18 mcg at 10/30/15 0929  . tiZANidine (ZANAFLEX) tablet 4 mg  4 mg Oral Q8H PRN Geradine Girt, DO      . Vitamin D (Ergocalciferol) (DRISDOL) capsule 50,000 Units  50,000 Units Oral Q Sun Jessica U Vann, DO   50,000 Units at 10/29/15 1046  . zolpidem (AMBIEN) tablet 5 mg  5 mg Oral QHS PRN Geradine Girt, DO   5 mg at 10/29/15 2159     Discharge Medications: Please see discharge summary for a list of discharge medications.  Relevant Imaging Results:  Relevant Lab Results:   Additional Information SSN: [id number]  [place], [place]

## 2015-10-30 NOTE — Progress Notes (Signed)
PROGRESS NOTE    [name] [id number] DOB: 01-25-1941 DOA: 10/27/2015 PCP: [name], MD  HPI/Brief narrative 75 year old female patient, PMH of NICM - LVEF has since normalized, Chronic combined systolic and diastolic CHF, morbid obesity, HLD, tachybradycardia syndrome status post PPM, COPD, depression, GERD, hypothyroid, permanent A. Fib on Eliquis, DM, CAD, OSA-not on CPAP, chronic respiratory failure on home oxygen 2 L/m at bedtime, recent parathyroidectomy, recent hospitalization 10/10/15-10/13/15 for acute on chronic combined CHF, A. fib with RVR and symptomatic hypocalcemia from recent parathyroidectomy, [facility] ED from SNF on 1/77/17 with complaints of worsening lower extremity edema, nonproductive cough, worsening dyspnea, low-grade fever and chills. Urine microscopy suggested UTI. Patient was admitted for management of decompensated CHF and possible UTI.  Assessment/Plan:   1. Acute on chronic combined systolic and diastolic CHF: 2-D echo [id number]: Poor acoustic windows. Overall LVEF appeared normal to mildly depressed, moderate LVH. Started on IV Lasix 80 mg twice a day. We will repeat 2-D echo. - 776 mL since admission (not sure if accurate). Clinically improving. Continue current Lasix 80 mg IV twice a day. Per patient, not making much urine and leg edema persists. Will add Zaroxolyn 2.5 MG by mouth 1 dose and monitor. 2. Escherichia coli Urinary tract infection: Continue IV Rocephin pending urine culture results. Complains of some ongoing chills. 3. Type II DM with renal complications: continue slightly reduced dose Lantus and SSI. On 1/28, patient inadvertently received NovoLog 3 units in the morning by RN without adverse effects. Patient was updated and was apologized to. 4. Chronic respiratory failure: Continue nightly oxygen at 2 L/m via nasal cannula. 5. Hypocalcemia, status post recent parathyroidectomy: Corrected serum calcium is improved from 8 > 8.7>8.9.  Replace calcium and follow closely. Asymptomatic. Increased oral calcium supplementation. Monitor electrolytes periodically. 6. Tachybradycardia syndrome status post PPM. Follows with Dr. Virl Axe. 7. Permanent A. fib with controlled ventricular rate: Continue Apixaban and s/p PPM.  8. HLD: Continue statins 9. Hypothyroid: Continue Synthroid. No recent TSH, will check. 10. Stage III chronic kidney disease: Baseline creatinine is not clear but may be in the 1.2-1.4 range. May be close to baseline. Follow closely. Creatinine has improved to 1.2.    DVT prophylaxis: on Eliquis Code Status: Full Family Communication: None at bedside.  Disposition Plan: DC to SNF when medically stable. Patient wishes to go to a different one this time. Possible DC in 1-2 days.  Consultants:  None  Procedures:  None  Antimicrobials:  IV Rocephin 1/27 >   Subjective: Denies dyspnea but states that she is not urinating as much and her leg swellings persist. Chills at times but no fevers. As per RN, no acute issues.  Objective: Filed Vitals:   10/29/15 2026 10/29/15 2032 10/30/15 0450 10/30/15 0930  BP: 111/65  105/66   Pulse: 88  84   Temp: 99 F (37.2 C)  98.7 F (37.1 C)   TempSrc: Oral  Oral   Resp: 16  17   Height:      Weight:   125.147 kg (275 lb 14.4 oz)   SpO2: 94% 94% 98% 97%    Intake/Output Summary (Last 24 hours) at 10/30/15 1046 Last data filed at 10/30/15 0857  Gross per 24 hour  Intake    834 ml  Output   1402 ml  Net   -568 ml   Filed Weights   10/28/15 0541 10/29/15 0433 10/30/15 0450  Weight: 125.646 kg (277 lb) 126.1 kg (278 lb) 125.147 kg (275  lb 14.4 oz)    Exam:  General exam: Moderately built and morbidly obese pleasant elderly female lying comfortably supine in bed. Respiratory system: clear to auscultation. No increased work of breathing. Cardiovascular system: S1 & S2 heard, RRR. No JVD, murmurs, gallops, clicks. Trace bilateral leg edema. Telemetry:  A. fib with BBB morphology and occasional PVCs and PM spikes.  Gastrointestinal system: Abdomen is nondistended, soft and nontender. Normal bowel sounds heard. Central nervous system: Alert and oriented. No focal neurological deficits. Extremities: Symmetric 5 x 5 power.   Data Reviewed: Basic Metabolic Panel:  Recent Labs Lab 10/27/15 1317 10/28/15 0147 10/28/15 0348 10/29/15 0358 10/30/15 0510  NA 138 139 138 137 139  K 4.1 4.1 4.4 4.2 4.0  CL 100* 100* 102 97* 98*  CO2 28 25 24 27 28   GLUCOSE 144* 119* 107* 127* 87  BUN 19 27* 27* 33* 28*  CREATININE 1.35* 1.42* 1.47* 1.42* 1.21*  CALCIUM 7.2* 6.8* 7.0* 7.3* 7.5*  PHOS  --   --   --   --  3.8   Liver Function Tests:  Recent Labs Lab 10/27/15 1317 10/29/15 0358 10/30/15 0510  AST 31 28  --   ALT 27 22  --   ALKPHOS 190* 159*  --   BILITOT 1.8* 0.8  --   PROT 7.1 6.1*  --   ALBUMIN 2.7* 2.3* 2.3*   No results for input(s): LIPASE, AMYLASE in the last 168 hours. No results for input(s): AMMONIA in the last 168 hours. CBC:  Recent Labs Lab 10/27/15 1317 10/28/15 0147  WBC 14.4* 9.0  HGB 15.3* 14.0  HCT 45.9 41.8  MCV 98.1 97.0  PLT 387 310   Cardiac Enzymes:  Recent Labs Lab 10/27/15 2031 10/28/15 0147 10/28/15 0740  TROPONINI 0.03 0.03 0.03   BNP (last 3 results) No results for input(s): PROBNP in the last 8760 hours. CBG:  Recent Labs Lab 10/29/15 0605 10/29/15 1222 10/29/15 1633 10/29/15 2115 10/30/15 0546  GLUCAP 105* 115* 128* 138* 70    Recent Results (from the past 240 hour(s))  Urine culture     Status: None (Preliminary result)   Collection Time: 10/27/15  8:25 PM  Result Value Ref Range Status   Specimen Description URINE, CLEAN CATCH  Final   Special Requests NONE  Final   Culture >=100,000 COLONIES/mL ESCHERICHIA COLI  Final   Report Status PENDING  Incomplete         Studies: No results found.      Scheduled Meds: . apixaban  5 mg Oral BID  . atorvastatin   80 mg Oral QHS  . calcium carbonate  4 tablet Oral TID WC  . cefTRIAXone (ROCEPHIN)  IV  1 g Intravenous Q24H  . folic acid  1 mg Oral Daily  . furosemide  80 mg Intravenous BID  . insulin aspart  0-9 Units Subcutaneous TID WC  . insulin glargine  15 Units Subcutaneous BH-q7a  . levothyroxine  75 mcg Oral Daily  . metoprolol  100 mg Oral BID  . mometasone-formoterol  2 puff Inhalation BID  . multivitamin with minerals  1 tablet Oral Daily  . pantoprazole  40 mg Oral Daily  . polycarbophil  625 mg Oral QHS  . potassium chloride SA  20 mEq Oral BID  . ranolazine  500 mg Oral BID  . sodium chloride flush  3 mL Intravenous Q12H  . sulfaSALAzine  1,000 mg Oral BID  . tiotropium  18 mcg Inhalation Daily  .  Vitamin D (Ergocalciferol)  50,000 Units Oral Q Sun   Continuous Infusions:   Active Problems:   OBESITY-MORBID (>100')   Type 2 diabetes mellitus with diabetic nephropathy (HCC)   Acute on chronic combined systolic and diastolic CHF, NYHA class 3 (HCC)   UTI (lower urinary tract infection)    Time spent: 20 minutes.    Vernell Leep, MD, FACP, FHM. [facility] Pager [phone]  If 7PM-7AM, please contact night-coverage [email or web address] Password [email or web address] 10/30/2015, 10:46 AM    LOS: 3 days

## 2015-10-30 NOTE — Clinical Documentation Improvement (Signed)
Hospitalist  (Query responses must be documented in the current medical record, not on the CDI BPA form.)  Please clarify the type of atrial fibrillation.  Clinical Information: Home medication list includes Eliquis. 12 lead EKG this admission shows Atrial Fibrillation  Please exercise your independent, professional judgment when responding. A specific answer is not anticipated or expected.   Thank You, Erling Conte  RN BSN CCDS [phone] Health Information Management [place]

## 2015-10-30 NOTE — Progress Notes (Signed)
  Echocardiogram 2D Echocardiogram has been performed.  Annetta Deiss 10/30/2015, 12:09 PM

## 2015-10-30 NOTE — Clinical Social Work Placement (Signed)
   CLINICAL SOCIAL WORK PLACEMENT  NOTE  Date:  10/30/2015  Patient Details  Name: MARTIKA SANGIACOMO MRN: [id number] Date of Birth: 22-Jun-1941  Clinical Social Work is seeking post-discharge placement for this patient at the [place] level of care (*CSW will initial, date and re-position this form in  chart as items are completed):  Yes   Patient/family provided with [place] Work Department's list of facilities offering this level of care within the geographic area requested by the patient (or if unable, by the patient's family).  Yes   Patient/family informed of their freedom to choose among providers that offer the needed level of care, that participate in Medicare, Medicaid or managed care program needed by the patient, have an available bed and are willing to accept the patient.  Yes   Patient/family informed of [facility]'s ownership interest in [facility] and [facility], as well as of the fact that they are under no obligation to receive care at these facilities.  PASRR submitted to EDS on       PASRR number received on       Existing PASRR number confirmed on 10/30/15     FL2 transmitted to all facilities in geographic area requested by pt/family on 10/30/15     FL2 transmitted to all facilities within larger geographic area on       Patient informed that his/her managed care company has contracts with or will negotiate with certain facilities, including the following:   [facility])         Patient/family informed of bed offers received.  Patient chooses bed at       Physician recommends and patient chooses bed at      Patient to be transferred to   on  .  Patient to be transferred to facility by Ambulance [facility])     Patient family notified on   of transfer.  Name of family member notified:        PHYSICIAN Please prepare prescriptions, Please prepare priority discharge summary, including medications, Please sign  FL2     Additional Comment:    _______________________________________________ Williemae Area, LCSW 10/30/2015, 9:43 PM

## 2015-10-30 NOTE — Care Management Important Message (Signed)
Important Message  Patient Details  Name: RANNIE RUIS MRN: [id number] Date of Birth: 1940/12/09   Medicare Important Message Given:  Yes    Barb Merino Perla Echavarria 10/30/2015, 2:49 PM

## 2015-10-30 NOTE — Care Management Note (Addendum)
Case Management Note  Patient Details  Name: [name] MRN: [id number] Date of Birth: 1941-03-06  Subjective/Objective:      CHF, UTI              Action/Plan: Referral to CSW for SNF. Pt was recent dc to [facility].   Expected Discharge Date:  11/01/2015               Expected Discharge Plan:  Skilled Nursing Facility  In-House Referral:  Clinical Social Work  Discharge planning Services  CM Consult  Post Acute Care Choice:  NA Choice offered to:  NA  DME Arranged:  N/A DME Agency:  NA  HH Arranged:  NA HH Agency:  NA  Status of Service:  Completed, signed off  Medicare Important Message Given:    Date Medicare IM Given:    Medicare IM give by:    Date Additional Medicare IM Given:    Additional Medicare Important Message give by:     If discussed at [place] of Stay Meetings, dates discussed:    Additional Comments:  Erenest Rasher, RN 10/30/2015, 10:34 AM

## 2015-10-30 NOTE — Clinical Social Work Note (Signed)
Clinical Social Work Assessment  Patient Details  Name: [name] MRN: [id number] Date of Birth: 1940-12-28  Date of referral:  10/30/15               Reason for consult:  Facility Placement                Permission sought to share information with:  Facility Sport and exercise psychologist, Family Supports Permission granted to share information::  Yes, Verbal Permission Granted  Name::     [place] SNF's, Daughter [name] and her significant other Bill      Housing/Transportation Living arrangements for the past 2 months:  [place] of Information:  Patient Patient Interpreter Needed:  None Criminal Activity/Legal Involvement Pertinent to Current Situation/Hospitalization:  No - Comment as needed Significant Relationships:  Adult Children, Friend Lives with:  Facility Resident Do you feel safe going back to the place where you live?  Yes (In short term SNF; plans to return home eventually) Need for family participation in patient care:  No ([place])  Care giving concerns:  Patient expresses desire to change facilities at d/c. She has been a resident at [facility] in the past.   Facilities manager / plan:  CSW met with patient today to discuss current condition and d/c plan.  Patient states that she was admitted to [facility] several weeks ago for short term rehab. She states she would like to change facilities.  Patient indicates that while she makes her own health care decisions, she welcomes support from her daughter and daughter's significant other- Bill.  Discussed bed search process and she agreed to same.  Fl2 will be placed on chart for MD's signature.    Employment status:  Retired Nurse, adult PT Recommendations:  [place] / Referral to community resources:  [place]  Patient/Family's Response to care:  Patient states that she is feeling better and denies any  current concerns.  Patient/Family's Understanding of and Emotional Response to Diagnosis, Current Treatment, and Prognosis:  Patient expresses a fairly limited understanding of her diagnosis, treatment needs and prognosis. She is however, noted to  Very calm and relaxed when discussing her current medical needs and desire to seek placement in another facility.    Emotional Assessment Appearance:  Appears stated age Attitude/Demeanor/Rapport:   (Responsive, cooperative, rational, involved) Affect (typically observed):  Calm, Quiet, Pleasant Orientation:  Oriented to Self, Oriented to Place, Oriented to  Time, Oriented to Situation Alcohol / Substance use:  Other (Former smoker many years ago) Psych involvement (Current and /or in the community):  No (Comment)  Discharge Needs  Concerns to be addressed:  Care Coordination Readmission within the last 30 days:  Yes Current discharge risk:  Dependent with Mobility Barriers to Discharge:  Continued Medical Work up   Estill Bakes 10/30/2015, 9:35 PM

## 2015-10-31 LAB — RENAL FUNCTION PANEL
Albumin: 2.2 g/dL — ABNORMAL LOW (ref 3.5–5.0)
Anion gap: 14 (ref 5–15)
BUN: 25 mg/dL — AB (ref 6–20)
CHLORIDE: 92 mmol/L — AB (ref 101–111)
CO2: 30 mmol/L (ref 22–32)
Calcium: 7.6 mg/dL — ABNORMAL LOW (ref 8.9–10.3)
Creatinine, Ser: 1.13 mg/dL — ABNORMAL HIGH (ref 0.44–1.00)
GFR calc Af Amer: 54 mL/min — ABNORMAL LOW (ref >60)
GFR calc non Af Amer: 46 mL/min — ABNORMAL LOW (ref >60)
GLUCOSE: 94 mg/dL (ref 65–99)
POTASSIUM: 3.4 mmol/L — AB (ref 3.5–5.1)
Phosphorus: 3.7 mg/dL (ref 2.5–4.6)
Sodium: 136 mmol/L (ref 135–145)

## 2015-10-31 LAB — GLUCOSE, CAPILLARY
GLUCOSE-CAPILLARY: 120 mg/dL — AB (ref 65–99)
GLUCOSE-CAPILLARY: 177 mg/dL — AB (ref 65–99)
GLUCOSE-CAPILLARY: 157 mg/dL — AB (ref 65–99)
Glucose-Capillary: 92 mg/dL (ref 65–99)

## 2015-10-31 LAB — TSH: TSH: 1.969 u[IU]/mL (ref 0.350–4.500)

## 2015-10-31 MED ORDER — SULFASALAZINE 500 MG PO TBEC
1000.0000 mg | DELAYED_RELEASE_TABLET | Freq: Two times a day (BID) | ORAL | Status: DC
Start: 2015-10-31 — End: 2015-11-01
  Administered 2015-10-31 – 2015-11-01 (×3): 1000 mg via ORAL
  Filled 2015-10-31 (×4): qty 2

## 2015-10-31 MED ORDER — METOLAZONE 2.5 MG PO TABS
2.5000 mg | ORAL_TABLET | Freq: Once | ORAL | Status: AC
  Administered 2015-10-31: 2.5 mg via ORAL
  Filled 2015-10-31: qty 1

## 2015-10-31 MED ORDER — FUROSEMIDE 10 MG/ML IJ SOLN
40.0000 mg | Freq: Two times a day (BID) | INTRAMUSCULAR | Status: DC
  Administered 2015-10-31: 40 mg via INTRAVENOUS
  Filled 2015-10-31: qty 4

## 2015-10-31 NOTE — Progress Notes (Addendum)
PROGRESS NOTE    RAEVAN DELIO [id number] DOB: 1941-02-02 DOA: 10/27/2015 PCP: [name], MD  HPI/Brief narrative 75 year old female patient, PMH of NICM - LVEF has since normalized, Chronic combined systolic and diastolic CHF, morbid obesity, HLD, tachybradycardia syndrome status post PPM, COPD, depression, GERD, hypothyroid, permanent A. Fib on Eliquis, DM, CAD, OSA-not on CPAP, chronic respiratory failure on home oxygen 2 L/m at bedtime, recent parathyroidectomy, recent hospitalization 10/10/15-10/13/15 for acute on chronic combined CHF, A. fib with RVR and symptomatic hypocalcemia from recent parathyroidectomy, presented to [facility] ED from SNF on 1/77/17 with complaints of worsening lower extremity edema, nonproductive cough, worsening dyspnea, low-grade fever and chills. Urine microscopy suggested UTI. Patient was admitted for management of decompensated CHF and possible UTI. Improving. Possible DC to SNF on 11/01/15.  Assessment/Plan:   1. Acute on chronic combined systolic and diastolic CHF: 2-D echo 123XX123: Poor acoustic windows. Repeat echo 10/30/15: LV size normal. Mild LVH-rest of results as below. No comment regarding LVEF. - 4.056 mL since admission. Initially started on IV Lasix 80 mg every 12 hours. She was not adequately diuresing. Started Zaroxolyn 2.5 MG by mouth 1 on 1/30 with good effect/diuresis. We will repeat Zaroxolyn 2.5 MG 1 today & reduce Lasix to 40 mg IV every 12 hours. Steadily improving. Outpatient follow-up with cardiology/Dr. Virl Axe. 2. Escherichia coli Urinary tract infection: Continue IV Rocephin while inpatient and transition to oral antibiotics/Ceftin at discharge to complete total 7 days. Complains of some ongoing chills. 3. Type II DM with renal complications: continue slightly reduced dose Lantus and SSI. On 1/28, patient inadvertently received NovoLog 3 units in the morning by RN without adverse effects. Patient was updated and was apologized to.  Fluctuating but reasonable inpatient control. 4. Chronic respiratory failure: Continue nightly oxygen at 2 L/m via nasal cannula-ensure that this is on patient's discharge summary. Patient apparently was not getting nightly oxygen at SNF prior to admission. 5. Hypocalcemia, status post recent parathyroidectomy: Corrected serum calcium is improved from 8 > 8.7>8.9. Replace calcium and follow closely. Asymptomatic. Increased oral calcium supplementation. Monitor electrolytes periodically. 6. Tachybradycardia syndrome status post PPM. Follows with Dr. Virl Axe. 7. Permanent A. fib with controlled ventricular rate: Continue Apixaban and s/p PPM.  8. HLD: Continue statins 9. Hypothyroid: Continue Synthroid. No recent TSH, will check. 10. Stage III chronic kidney disease: Baseline creatinine is not clear but may be in the 1.2-1.4 range. Follow closely. Creatinine has improved to 1.1. 11. Hypokalemia: Replace and follow.    DVT prophylaxis: on Eliquis Code Status: Full Family Communication: Discussed with patient's son-in-law at bedside..  Disposition Plan: DC to SNF when medically stable. Patient wishes to go to a different one this time. Possible DC to SNF on 11/01/15.  Consultants:  None  Procedures:  2-D echo 10/30/15: Study Conclusions  - Left ventricle: The cavity size was normal. Wall thickness was increased in a pattern of mild LVH. - Mitral valve: There was mild regurgitation. - Left atrium: The atrium was moderately dilated. - Right ventricle: The cavity size was moderately dilated. Systolic function was moderately to severely reduced. - Right atrium: The atrium was moderately to severely dilated. - Tricuspid valve: There was moderate regurgitation. - Pulmonary arteries: PA peak pressure: 70 mm Hg (S). - Pericardium, extracardiac: A small pericardial effusion was identified.  Antimicrobials:  IV Rocephin 1/27 >   Subjective: Denies dyspnea. States that she finally  started to urinate well. Intermittent dry hacking cough. Leg swelling starting to decrease.  Objective:  Filed Vitals:   10/30/15 1839 10/30/15 2020 10/31/15 0623 10/31/15 0851  BP: 127/73 111/69 105/70   Pulse: 70 92 96   Temp: 97.9 F (36.6 C) 99.7 F (37.6 C) 99 F (37.2 C)   TempSrc: Oral Oral Oral   Resp: 18 17 18    Height:      Weight:   122.063 kg (269 lb 1.6 oz)   SpO2: 99% 93% 95% 94%    Intake/Output Summary (Last 24 hours) at 10/31/15 1253 Last data filed at 10/31/15 1029  Gross per 24 hour  Intake    880 ml  Output   3950 ml  Net  -3070 ml   Filed Weights   10/29/15 0433 10/30/15 0450 10/31/15 0623  Weight: 126.1 kg (278 lb) 125.147 kg (275 lb 14.4 oz) 122.063 kg (269 lb 1.6 oz)    Exam:  General exam: Moderately built and morbidly obese pleasant elderly female sitting up comfortably in chair this morning. Respiratory system: clear to auscultation. No increased work of breathing. Cardiovascular system: S1 & S2 heard, irregularly irregular. No JVD, murmurs, gallops, clicks. Trace bilateral leg edema-decreasing . Telemetry: A. fib in the 90s-100s  with BBB morphology and occasional PVCs and PM spikes.  Gastrointestinal system: Abdomen is nondistended, soft and nontender. Normal bowel sounds heard. Central nervous system: Alert and oriented. No focal neurological deficits. Extremities: Symmetric 5 x 5 power.   Data Reviewed: Basic Metabolic Panel:  Recent Labs Lab 10/28/15 0147 10/28/15 0348 10/29/15 0358 10/30/15 0510 10/31/15 0510  NA 139 138 137 139 136  K 4.1 4.4 4.2 4.0 3.4*  CL 100* 102 97* 98* 92*  CO2 25 24 27 28 30   GLUCOSE 119* 107* 127* 87 94  BUN 27* 27* 33* 28* 25*  CREATININE 1.42* 1.47* 1.42* 1.21* 1.13*  CALCIUM 6.8* 7.0* 7.3* 7.5* 7.6*  PHOS  --   --   --  3.8 3.7   Liver Function Tests:  Recent Labs Lab 10/27/15 1317 10/29/15 0358 10/30/15 0510 10/31/15 0510  AST 31 28  --   --   ALT 27 22  --   --   ALKPHOS 190* 159*  --    --   BILITOT 1.8* 0.8  --   --   PROT 7.1 6.1*  --   --   ALBUMIN 2.7* 2.3* 2.3* 2.2*   No results for input(s): LIPASE, AMYLASE in the last 168 hours. No results for input(s): AMMONIA in the last 168 hours. CBC:  Recent Labs Lab 10/27/15 1317 10/28/15 0147  WBC 14.4* 9.0  HGB 15.3* 14.0  HCT 45.9 41.8  MCV 98.1 97.0  PLT 387 310   Cardiac Enzymes:  Recent Labs Lab 10/27/15 2031 10/28/15 0147 10/28/15 0740  TROPONINI 0.03 0.03 0.03   BNP (last 3 results) No results for input(s): PROBNP in the last 8760 hours. CBG:  Recent Labs Lab 10/30/15 1054 10/30/15 1646 10/30/15 2055 10/31/15 0615 10/31/15 1156  GLUCAP 167* 121* 222* 92 120*    Recent Results (from the past 240 hour(s))  Urine culture     Status: None   Collection Time: 10/27/15  8:25 PM  Result Value Ref Range Status   Specimen Description URINE, CLEAN CATCH  Final   Special Requests NONE  Final   Culture >=100,000 COLONIES/mL ESCHERICHIA COLI  Final   Report Status 10/30/2015 FINAL  Final   Organism ID, Bacteria ESCHERICHIA COLI  Final      Susceptibility   Escherichia coli - MIC*  AMPICILLIN <=2 SENSITIVE Sensitive     CEFAZOLIN <=4 SENSITIVE Sensitive     CEFTRIAXONE <=1 SENSITIVE Sensitive     CIPROFLOXACIN <=0.25 SENSITIVE Sensitive     GENTAMICIN >=16 RESISTANT Resistant     IMIPENEM <=0.25 SENSITIVE Sensitive     NITROFURANTOIN <=16 SENSITIVE Sensitive     TRIMETH/SULFA <=20 SENSITIVE Sensitive     AMPICILLIN/SULBACTAM <=2 SENSITIVE Sensitive     PIP/TAZO <=4 SENSITIVE Sensitive     * >=100,000 COLONIES/mL ESCHERICHIA COLI         Studies: No results found.      Scheduled Meds: . apixaban  5 mg Oral BID  . atorvastatin  80 mg Oral QHS  . calcium carbonate  4 tablet Oral TID WC  . cefTRIAXone (ROCEPHIN)  IV  1 g Intravenous Q24H  . folic acid  1 mg Oral Daily  . furosemide  80 mg Intravenous BID  . insulin aspart  0-9 Units Subcutaneous TID WC  . insulin glargine  12  Units Subcutaneous BH-q7a  . levothyroxine  75 mcg Oral Daily  . metoprolol  100 mg Oral BID  . mometasone-formoterol  2 puff Inhalation BID  . multivitamin with minerals  1 tablet Oral Daily  . pantoprazole  40 mg Oral Daily  . polycarbophil  625 mg Oral QHS  . potassium chloride SA  20 mEq Oral BID  . ranolazine  500 mg Oral BID  . sodium chloride flush  3 mL Intravenous Q12H  . sulfaSALAzine  1,000 mg Oral BID  . tiotropium  18 mcg Inhalation Daily  . Vitamin D (Ergocalciferol)  50,000 Units Oral Q Sun   Continuous Infusions:   Active Problems:   OBESITY-MORBID (>100')   Type 2 diabetes mellitus with diabetic nephropathy (HCC)   Acute on chronic combined systolic and diastolic CHF, NYHA class 3 (HCC)   UTI (lower urinary tract infection)    Time spent: 20 minutes.    Vernell Leep, MD, FACP, FHM. [facility] Pager [phone]  If 7PM-7AM, please contact night-coverage [email or web address] Password [email or web address] 10/31/2015, 12:53 PM    LOS: 4 days

## 2015-10-31 NOTE — Progress Notes (Signed)
Physical Therapy Treatment Patient Details Name: [name] MRN: [id number] DOB: Oct 27, 1940 Today's Date: 10/31/2015    History of Present Illness 75 year old female patient, PMH of NICM - LVEF has since normalized, Chronic combined systolic and diastolic CHF, morbid obesity, HLD, tachybradycardia syndrome status post PPM, COPD, depression, GERD, hypothyroid, A. fib, DM, CAD, OSA-not on CPAP, chronic respiratory failure on home oxygen 2 L/m at bedtime, recent parathyroidectomy, recent hospitalization 10/10/15-10/13/15 for acute on chronic combined CHF, A. fib with RVR and symptomatic hypocalcemia from recent parathyroidectomy, presented to [facility] ED from SNF on 1/77/17 with complaints of worsening lower extremity edema, nonproductive cough, worsening dyspnea, low-grade fever and chills. Urine microscopy suggested UTI. Patient was admitted for management of decompensated CHF and possible UTI.    PT Comments    Patient seen for mobility progression. Continues to remain limited by fatigue with movement. Very deconditioned. Tolerated OOB activity. Will continue to see and progress as tolerated.   Follow Up Recommendations  SNF;Supervision/Assistance - 24 hour     Equipment Recommendations  Other (comment) (TBA)    Recommendations for Other Services       Precautions / Restrictions Precautions Precautions: Fall Precaution Comments: obesity Restrictions Weight Bearing Restrictions: No    Mobility  Bed Mobility Overal bed mobility: Needs Assistance Bed Mobility: Sit to Supine     Supine to sit: Min guard     General bed mobility comments: Min guard for safety, VCs for positioning   Transfers Overall transfer level: Needs assistance Equipment used: Rolling walker (2 wheeled)   Sit to Stand: Min assist Stand pivot transfers: Min assist       General transfer comment: Vcs for hand placement and positioning upon power up to standing. flexed posture, improved with tactile cues.    Ambulation/Gait Ambulation/Gait assistance: Min assist Ambulation Distance (Feet): 6 Feet Assistive device: Rolling walker (2 wheeled) Gait Pattern/deviations: Step-to pattern;Decreased stride length;Trunk flexed;Wide base of support   Gait velocity interpretation: Below normal speed for age/gender General Gait Details: Patient with heavy reliance on UE support. Patient easily fatigues with limited mobility.    Stairs            Wheelchair Mobility    Modified Rankin (Stroke Patients Only)       Balance Overall balance assessment: Needs assistance Sitting-balance support: No upper extremity supported Sitting balance-Leahy Scale: Fair     Standing balance support: Bilateral upper extremity supported Standing balance-Leahy Scale: Poor Standing balance comment: continues to deomnstrate heavy reliance on RW                    Cognition Arousal/Alertness: Awake/alert Behavior During Therapy: Flat affect Overall Cognitive Status: Within Functional Limits for tasks assessed                      Exercises General Exercises - Lower Extremity Ankle Circles/Pumps: AROM;Both;5 reps;Supine (prior to activity)    General Comments        Pertinent Vitals/Pain Pain Assessment: Faces Faces Pain Scale: Hurts little more Pain Location: generalized Pain Descriptors / Indicators: Aching Pain Intervention(s): Limited activity within patient's tolerance;Monitored during session;Repositioned    Home Living                      Prior Function            PT Goals (current goals can now be found in the care plan section) Acute Rehab PT Goals Patient Stated Goal: To get  stronger PT Goal Formulation: With patient Time For Goal Achievement: 11/12/15 Potential to Achieve Goals: Good Progress towards PT goals: Progressing toward goals    Frequency  Min 2X/week    PT Plan      Co-evaluation             End of Session Equipment Utilized  During Treatment: Gait belt Activity Tolerance: Patient limited by fatigue Patient left: in chair;with call bell/phone within reach     Time: 0750-0806 PT Time Calculation (min) (ACUTE ONLY): 16 min  Charges:  $Therapeutic Activity: 8-22 mins                    G CodesDuncan [name] 11/28/15, 9:30 AM [name], PT DPT  [phone]

## 2015-11-01 ENCOUNTER — Inpatient Hospital Stay (HOSPITAL_COMMUNITY): Payer: Medicare Other

## 2015-11-01 DIAGNOSIS — I472 Ventricular tachycardia: Secondary | ICD-10-CM

## 2015-11-01 DIAGNOSIS — E876 Hypokalemia: Secondary | ICD-10-CM

## 2015-11-01 LAB — GLUCOSE, CAPILLARY
GLUCOSE-CAPILLARY: 119 mg/dL — AB (ref 65–99)
Glucose-Capillary: 125 mg/dL — ABNORMAL HIGH (ref 65–99)

## 2015-11-01 LAB — RENAL FUNCTION PANEL
ALBUMIN: 2.4 g/dL — AB (ref 3.5–5.0)
ANION GAP: 13 (ref 5–15)
BUN: 22 mg/dL — ABNORMAL HIGH (ref 6–20)
CALCIUM: 8.1 mg/dL — AB (ref 8.9–10.3)
CO2: 33 mmol/L — ABNORMAL HIGH (ref 22–32)
CREATININE: 1.05 mg/dL — AB (ref 0.44–1.00)
Chloride: 88 mmol/L — ABNORMAL LOW (ref 101–111)
GFR calc non Af Amer: 51 mL/min — ABNORMAL LOW (ref >60)
GFR, EST AFRICAN AMERICAN: 59 mL/min — AB (ref >60)
GLUCOSE: 115 mg/dL — AB (ref 65–99)
PHOSPHORUS: 3.6 mg/dL (ref 2.5–4.6)
Potassium: 3.4 mmol/L — ABNORMAL LOW (ref 3.5–5.1)
SODIUM: 134 mmol/L — AB (ref 135–145)

## 2015-11-01 LAB — MAGNESIUM: MAGNESIUM: 1.5 mg/dL — AB (ref 1.7–2.4)

## 2015-11-01 MED ORDER — CALCIUM CARBONATE 1250 (500 CA) MG PO TABS: 4.0000 | ORAL_TABLET | Freq: Three times a day (TID) | ORAL | Status: DC

## 2015-11-01 MED ORDER — FUROSEMIDE 40 MG PO TABS
40.0000 mg | ORAL_TABLET | Freq: Two times a day (BID) | ORAL | Status: DC
  Administered 2015-11-01: 40 mg via ORAL
  Filled 2015-11-01 (×2): qty 1

## 2015-11-01 MED ORDER — INSULIN ASPART 100 UNIT/ML ~~LOC~~ SOLN: 0.0000 [IU] | Freq: Three times a day (TID) | SUBCUTANEOUS | Status: DC

## 2015-11-01 MED ORDER — MAGNESIUM SULFATE 4 GM/100ML IV SOLN
4.0000 g | Freq: Once | INTRAVENOUS | Status: AC
  Administered 2015-11-01: 4 g via INTRAVENOUS
  Filled 2015-11-01: qty 100

## 2015-11-01 MED ORDER — INSULIN GLARGINE 100 UNIT/ML ~~LOC~~ SOLN: 12.0000 [IU] | SUBCUTANEOUS | Status: DC

## 2015-11-01 MED ORDER — POTASSIUM CHLORIDE CRYS ER 20 MEQ PO TBCR
40.0000 meq | EXTENDED_RELEASE_TABLET | Freq: Once | ORAL | Status: AC
  Administered 2015-11-01: 40 meq via ORAL
  Filled 2015-11-01: qty 2

## 2015-11-01 MED ORDER — METOLAZONE 2.5 MG PO TABS: 2.5000 mg | ORAL_TABLET | ORAL | Status: DC

## 2015-11-01 MED ORDER — POTASSIUM CHLORIDE CRYS ER 20 MEQ PO TBCR
40.0000 meq | EXTENDED_RELEASE_TABLET | Freq: Two times a day (BID) | ORAL | Status: DC
  Administered 2015-11-01: 40 meq via ORAL
  Filled 2015-11-01: qty 2

## 2015-11-01 NOTE — Consult Note (Signed)
CARDIOLOGY CONSULT NOTE  Patient ID: [name], MRN: [id number], DOB/AGE: Dec 14, 1940 75 y.o. Admit date: 10/27/2015 Date of Consult: 11/01/2015  Primary Physician: [name], MD Primary Cardiologist: Dr. Caryl Comes Referring Physician: Dr. Algis Liming  Chief Complaint: Worsening SOB, lower extremity edema  Reason for Consultation: Non-sustained VTach  HPI: [name] is a 75yo woman with PMHx of nonischemic cardiomyopathy (LVEF now normalized per echo in 2011), tachybrady syndrome s/p pacemaker placement in 2010, CAD, atrial fibrillation on Eliquis, chronic diastolic heart failure, type 2 diabetes, hyperlipidemia, COPD, OSA, and hypothyroidism who initially presented on 10/27/15 from SNF with worsening SOB, bilateral lower extremity edema, fever/chills, and nonproductive cough. She was admitted for management of UTI and decompensation of her CHF.   She was scheduled to be discharged back to her SNF today, but then found to have non-sustained VTach on her telemetry. She reports feeling fine this morning. She denies any chest pain, shortness of breath, or palpitations.   Medical History:  Past Medical History  Diagnosis Date  . Nonischemic cardiomyopathy ([place])     history of,  EF 20-25% at left heart cath in 06/2007; echo 2011 had normal LV function  . Chronic diastolic heart failure ([place])     Echo 06/2010: Moderate LVH, EF 55-65%, normal wall motion, mild MR, moderate to severe LAE, mild RAE.   . Morbid obesity ([place])   . Hyperlipidemia   . Tachy-brady syndrome ([place])     status post implant of a medtronic dual-chamber pacemaker in 2001.  explanted in 2010 -- [place] pacemaker in 2010.  Marland Kitchen Chronic obstructive pulmonary disease ([place])   . Osteoporosis   . Depression   . GERD (gastroesophageal reflux disease)   . Crohn's disease ([place])   . Hypothyroidism     treated  . Seasonal allergies   . Pacemaker     Medtronic  . Atrial fibrillation ([place])     Status post  pulmonary vein isolation 2009 at [facility]  . CAD (coronary artery disease)     LHC 05/2007: pLAD 70-80%, pRCA 40%, EF 20-25%. LAD lesion treated medically.   . Coronary artery disease 06/15/2012  . Atrial fibrillation ([place]) 12/18/2007    Annotation: refractory Qualifier: Diagnosis of  By: Doy Mince LPN, Megan    . Morbid obesity with BMI of 40.0-44.9, adult ([place]) 06/22/2012  . Diabetes mellitus ([place]) 06/22/2012  . Diabetes mellitus without complication ([place])   . COPD (chronic obstructive pulmonary disease) ([place])   . Gout   . Dysrhythmia     atrial fib  . Peripheral vascular disease ([facility]) 15    rt arm clots  . Obstructive sleep apnea     continuous positive airway pressure not using at present  . Shortness of breath dyspnea     exersion  . CHF (congestive heart failure) ([place])   . On home oxygen therapy     "2L at night" (10/03/2015)      Surgical History:  Past Surgical History  Procedure Laterality Date  . Pulmonary vein isolation  05/12/2008    RFCA atrial fibrillation  . Post ablation pseudoaneurysm      at A fib ablation  . Pacemaker insertion  2010    medtronic ADAPTA   . Tonsillectomy    . Embolectomy Right 08/16/2014    Procedure: EMBOLECTOMY- RIGHT BRACHIAL ARTERY;  Surgeon: Serafina Mitchell, MD;  Location: [facility] OR;  Service: Vascular;  Laterality: Right;  . Cardiac catheterization  08/2000    noncritical disease mid  RCA, EF preserved  . Cardiac catheterization  05/29/2007    noncritical disease, EF 20-25%  . Cataract extraction w/ intraocular lens  implant, bilateral Bilateral 2014-2016  . Parathyroidectomy Right 01/30/2015    Procedure: PARATHYROIDECTOMY;  Surgeon: Armandina Gemma, MD;  Location: [facility];  Service: General;  Laterality: Right;  . Parathyroidectomy Left 10/03/2015    Left inferior parathyroidectomy w/neck exploration  . Parathyroidectomy Left 10/03/2015    Procedure: LEFT PARATHYROID EXPLORATION AND  PARATHYROIDECTOMY;  Surgeon: Armandina Gemma, MD;  Location: [facility];   Service: General;  Laterality: Left;     Home Meds: Prior to Admission medications   Medication Sig Start Date End Date Taking? Authorizing Provider  albuterol (PROVENTIL HFA;VENTOLIN HFA) 108 (90 BASE) MCG/ACT inhaler Inhale 2 puffs into the lungs every 6 (six) hours as needed for wheezing or shortness of breath.   Yes Historical Provider, MD  atorvastatin (LIPITOR) 80 MG tablet Take 80 mg by mouth at bedtime.     Yes Historical Provider, MD  calcium carbonate (OS-CAL - DOSED IN MG OF ELEMENTAL CALCIUM) 1250 (500 Ca) MG tablet Take 2 tablets (1,000 mg of elemental calcium total) by mouth 3 (three) times daily with meals. 10/13/15  Yes Nishant Dhungel, MD  colchicine 0.6 MG tablet Take 1-2 tablets by mouth daily as needed (gout). TAKE 2 TABLETS BY MOUTH AS ONE DOSE, THEN 1 TABLET IN 1 HOUR FOR ACUTE GOUT EPISODE 08/09/15  Yes Historical Provider, MD  diphenhydramine-acetaminophen (TYLENOL PM) 25-500 MG TABS tablet Take 2 tablets by mouth at bedtime as needed (sleep/pain).   Yes Historical Provider, MD  ELIQUIS 5 MG TABS tablet TAKE 1 TABLET BY MOUTH TWICE A DAY 08/21/15  Yes Deboraha Sprang, MD  Fiber CAPS Take 1 tablet by mouth at bedtime.    Yes Historical Provider, MD  Fluticasone-Salmeterol (ADVAIR) 250-50 MCG/DOSE AEPB Inhale 1 puff into the lungs every 12 (twelve) hours. 01/17/15  Yes Kathee Delton, MD  folic acid (FOLVITE) 1 MG tablet Take 1 mg by mouth daily. 06/01/15  Yes Historical Provider, MD  furosemide (LASIX) 40 MG tablet TAKE 80 MG BY MOUTH TWICE A DAY 10/13/15  Yes Nishant Dhungel, MD  insulin glargine (LANTUS) 100 UNIT/ML injection Inject 0.2 mLs (20 Units total) into the skin every morning. 10/13/15  Yes Nishant Dhungel, MD  ipratropium-albuterol (DUONEB) 0.5-2.5 (3) MG/3ML SOLN Take 3 mLs by nebulization every 6 (six) hours as needed (wheezing and or shortness of breath). 02/18/15  Yes Nishant Dhungel, MD  KLOR-CON M20 20 MEQ tablet TAKE 1 TABLET BY MOUTH TWICE DAILY 04/13/15  Yes Deboraha Sprang, MD  levothyroxine (SYNTHROID, LEVOTHROID) 75 MCG tablet Take 75 mcg by mouth daily. 01/20/15  Yes Historical Provider, MD  losartan (COZAAR) 50 MG tablet Take 50 mg by mouth at bedtime.  01/16/15  Yes Historical Provider, MD  metoprolol (LOPRESSOR) 100 MG tablet TAKE 1 TABLET (100 MG TOTAL) BY MOUTH 2 (TWO) TIMES DAILY. 12/23/14  Yes Deboraha Sprang, MD  Multiple Vitamins-Minerals (MULTIVITAMIN WITH MINERALS) tablet Take 1 tablet by mouth daily.   Yes Historical Provider, MD  omeprazole (PRILOSEC OTC) 20 MG tablet Take 20 mg by mouth daily.     Yes Historical Provider, MD  RANEXA 500 MG 12 hr tablet Take 1 tablet by mouth 2 (two) times daily. 03/14/15  Yes Historical Provider, MD  sulfaSALAzine (AZULFIDINE) 500 MG tablet Take 1,000 mg by mouth 2 (two) times daily. 03/16/15  Yes Historical Provider, MD  Tiotropium Bromide Monohydrate (  SPIRIVA RESPIMAT) 2.5 MCG/ACT AERS Inhale 2 puffs into the lungs daily. 03/16/15  Yes Collene Gobble, MD  tiZANidine (ZANAFLEX) 4 MG tablet Take 4 mg by mouth every 8 (eight) hours as needed for muscle spasms.  09/13/15  Yes Historical Provider, MD  Vitamin D, Ergocalciferol, (DRISDOL) 50000 units CAPS capsule Take 50,000 Units by mouth every Sunday. 07/28/15  Yes Historical Provider, MD  HYDROcodone-acetaminophen (NORCO/VICODIN) 5-325 MG tablet Take 2 tablets by mouth every 6 (six) hours as needed for moderate pain. Patient not taking: Reported on 10/27/2015 10/13/15   Nishant Dhungel, MD  nitroGLYCERIN (NITROSTAT) 0.4 MG SL tablet Place 1 tablet (0.4 mg total) under the tongue every 5 (five) minutes as needed for chest pain. 07/07/14 10/10/15  Deboraha Sprang, MD    Inpatient Medications:  . apixaban  5 mg Oral BID  . atorvastatin  80 mg Oral QHS  . calcium carbonate  4 tablet Oral TID WC  . cefTRIAXone (ROCEPHIN)  IV  1 g Intravenous Q24H  . folic acid  1 mg Oral Daily  . furosemide  40 mg Oral BID  . insulin aspart  0-9 Units Subcutaneous TID WC  . insulin  glargine  12 Units Subcutaneous [id number]  . levothyroxine  75 mcg Oral Daily  . metoprolol  100 mg Oral BID  . mometasone-formoterol  2 puff Inhalation BID  . multivitamin with minerals  1 tablet Oral Daily  . pantoprazole  40 mg Oral Daily  . polycarbophil  625 mg Oral QHS  . potassium chloride SA  40 mEq Oral BID  . ranolazine  500 mg Oral BID  . sodium chloride flush  3 mL Intravenous Q12H  . sulfaSALAzine  1,000 mg Oral BID  . tiotropium  18 mcg Inhalation Daily  . Vitamin D (Ergocalciferol)  50,000 Units Oral Q Sun      Allergies: No Known Allergies  Social History   Social History  . Marital Status: Divorced    Spouse Name: N/A  . Number of Children: N/A  . Years of Education: N/A   Occupational History  . retired    Social History Main Topics  . Smoking status: Former Smoker -- 3.00 packs/day for 32 years    Quit date: 09/30/1986  . Smokeless tobacco: Former Systems developer    Quit date: 08/15/1987  . Alcohol Use: No  . Drug Use: No  . Sexual Activity: No   Other Topics Concern  . Not on file   Social History Narrative   ** Merged History Encounter **         Family History  Problem Relation Age of Onset  . Breast cancer Mother   . Heart disease Father   . Emphysema Father      Review of Systems: General: negative for chills, fever, night sweats or weight changes.  ENT: negative for rhinorrhea or epistaxis Cardiovascular: negative for dyspnea on exertion, orthopnea, or paroxysmal nocturnal dyspnea Dermatological: negative for rash Respiratory: Reports cough and wheezing.  GI: negative for nausea, vomiting, diarrhea, bright red blood per rectum, melena, or hematemesis GU: no hematuria, urgency, or frequency Neurologic: negative for visual changes, syncope, headache, or dizziness Heme: no easy bruising or bleeding Endo: negative for excessive thirst, thyroid disorder, or flushing Musculoskeletal: negative for joint pain or swelling, negative for myalgias  All  other systems reviewed and are otherwise negative except as noted above.  Physical Exam: Blood pressure 117/74, pulse 90, temperature 99 F (37.2 C), temperature source Oral, resp. rate  18, height 5\' 11"  (1.803 m), weight 267 lb 4.8 oz (121.246 kg), SpO2 91 %. General: elderly obese woman resting in bed, NAD HEENT: [place]/AT, EOMI, sclera anicteric, mucus membranes moist Neck: supple, no JVD appreciated, no carotid bruits CV: irregularly irregular, no m/g/r Pulm: Diffuse end expiratory wheezing bilaterally, crackles appreciated at bases, breathing comfortably on 2 L oxygen via  Abd: BS+, soft, obese, non-tender  Ext: 1+ nonpitting edema bilaterally Neuro: alert and oriented x 3, no focal deficits     Labs: Lab Results  Component Value Date   WBC 9.0 10/28/2015   HGB 14.0 10/28/2015   HCT 41.8 10/28/2015   MCV 97.0 10/28/2015   PLT 310 10/28/2015    Recent Labs Lab 10/29/15 0358  11/01/15 0420  NA 137  < > 134*  K 4.2  < > 3.4*  CL 97*  < > 88*  CO2 27  < > 33*  BUN 33*  < > 22*  CREATININE 1.42*  < > 1.05*  CALCIUM 7.3*  < > 8.1*  PROT 6.1*  --   --   BILITOT 0.8  --   --   ALKPHOS 159*  --   --   ALT 22  --   --   AST 28  --   --   GLUCOSE 127*  < > 115*  < > = values in this interval not displayed. No results found for: CHOL, HDL, LDLCALC, TRIG Lab Results  Component Value Date   DDIMER  04/25/2008    0.26        AT THE INHOUSE ESTABLISHED CUTOFF VALUE OF 0.48 ug/mL FEU, THIS ASSAY HAS BEEN DOCUMENTED IN THE LITERATURE TO HAVE    Radiology/Studies:  Dg Chest 2 View  10/27/2015  CLINICAL DATA:  Hypocalcemia, parathyroid surgery last October, coughing, vomiting, question pneumonia, lower extremity edema, history coronary artery disease, atrial fibrillation, diabetes mellitus, COPD, CHF, former smoker EXAM: CHEST  2 VIEW COMPARISON:  10/10/2015 FINDINGS: LEFT subclavian transvenous pacemaker leads project at RIGHT atrium and RIGHT ventricle. Enlargement of cardiac  silhouette with minimal pulmonary vascular congestion. Mediastinal contours normal. Bronchitic changes without pulmonary infiltrate, pleural effusion or pneumothorax. Bones demineralized. IMPRESSION: Enlargement of cardiac silhouette post pacemaker. Bronchitic changes without infiltrate. Electronically Signed   By: Lavonia Dana M.D.   On: 10/27/2015 13:47   Dg Chest 2 View  10/10/2015  CLINICAL DATA:  Shortness of breath and cough.  Initial encounter. EXAM: CHEST  2 VIEW COMPARISON:  PA and lateral chest 03/28/2015 02/15/2015. FINDINGS: Pacing device is again seen. There is cardiomegaly and mild vascular congestion without frank edema. No consolidative process, pneumothorax or effusion. No focal bony abnormality. IMPRESSION: No acute abnormality. Cardiomegaly and pulmonary vascular congestion. Electronically Signed   By: Inge Rise M.D.   On: 10/10/2015 12:51   Dg Chest Port 1 View  11/01/2015  CLINICAL DATA:  Congestive heart failure.  Cough. EXAM: PORTABLE CHEST 1 VIEW COMPARISON:  10/27/2015 FINDINGS: Cardial pacemaker is stable. Postsurgical changes at the thoracic inlet are seen. The cardiac silhouette is enlarged. Mediastinal contours appear intact. Atherosclerotic calcifications of the aortic arch are seen. There is no evidence of focal airspace consolidation, pleural effusion or pneumothorax. There is thickening of the interstitial markings likely related to a interstitial pulmonary edema. Osseous structures are without acute abnormality. Soft tissues are grossly normal. IMPRESSION: Enlarged cardiac silhouette with interstitial pulmonary edema. Electronically Signed   By: Fidela Salisbury M.D.   On: 11/01/2015 09:08  EKG:  10/29/15- Atrial fibrillation  Cardiac Studies: Echo 10/30/15 Study Conclusions - Left ventricle: The cavity size was normal. Wall thickness was increased in a pattern of mild LVH. - Mitral valve: There was mild regurgitation. - Left atrium: The atrium was  moderately dilated. - Right ventricle: The cavity size was moderately dilated. Systolic function was moderately to severely reduced. - Right atrium: The atrium was moderately to severely dilated. - Tricuspid valve: There was moderate regurgitation. - Pulmonary arteries: PA peak pressure: 70 mm Hg (S). - Pericardium, extracardiac: A small pericardial effusion was identified.  ASSESSMENT AND PLAN:   Ms. Mabie is a 75yo woman with significant cardiac history as noted above who presented with worsening SOB and bilateral leg swelling and was treated for decompensation of her CHF. Her volume status has improved with IV diuresis and addition of metolazone, however she still remains fluid overloaded as evidenced by pulmonary edema on her CXR this morning. She was also noted to have changes on her telemetry that were concerning for non-sustained VTach. After reviewing the telemetry (at times per Dr. Algis Liming), I do see one run of 9 beats of non-sustained VTach. No other runs of VTach per my review. She does have PVCs as well.   Single Run of VTach and PVCs: Likely due to electrolyte abnormalities from aggressive diuresis. With a single run of asymptomatic and non-sustained VTach I do not think she warrants further work up. She should follow up with Dr. Caryl Comes to have her pacemaker checked in the near future.  - Agree with checking Mg - She is on potassium supplementation - Recommend outpatient EP follow up to check pacemaker- she follows with Dr. Caryl Comes  Congestive Heart Failure: - Would recommend increasing Lasix to 80 mg PO BID - Also can use Metolazone 2.5 mg once a week and see if this helps with diuresis.   Attending note to follow.    Signed, Albin Felling, MD, MPH Internal Medicine Resident, PGY-II Pager: [phone] 11/01/2015, 10:15 AM   I have examined the patient and reviewed assessment and plan and discussed with patient.  Agree with above as stated.  Patient has diuresed.Replacing  potassium. Nonsustained ventricular tachycardia noted. I've spoken with the electrophysiology service. They will coordinate pacemaker follow-up which will allow [place] to assess the burden of arrhythmia. Could consider amiodarone in the future. At this point, since she is asymptomatic, will hold off on adding amiodarone. F/u with pacer clinic. Daily weights and sodium restriction will be helpful as well.  i personally reviewed her echocardiogram. It is difficult to estimate ejection fraction due to technical difficulties with this study, due to her size.  Go back to Lasix 80 mg twice a day. Will also use metolazone in addition.  Macarius Ruark S.

## 2015-11-01 NOTE — Progress Notes (Signed)
Attempted Report to [facility] with no answer.Pt discharged to Rehab with Son in Sports coach. Taken out via wheelchair. No questions or concerns.

## 2015-11-01 NOTE — Discharge Summary (Signed)
Physician Discharge Summary  [name] [id number] DOB: July 05, 1941 DOA: 10/27/2015  PCP: [name], MD  Admit date: 10/27/2015 Discharge date: 11/01/2015  Time spent: Greater than 30 minutes  Recommendations for Outpatient Follow-up:  1. M.D. at SNF in 3 days with repeat labs (renal panel & magnesium). 2. Dr. Virl Axe, Cardiology in 1 week. SNF to arrange follow-up. 3. Continue oxygen via nasal cannula at 2 L/m at bedtime.  Discharge Diagnoses:  Active Problems:   OBESITY-MORBID (>100')   Type 2 diabetes mellitus with diabetic nephropathy (HCC)   Acute on chronic combined systolic and diastolic CHF, NYHA class 3 (HCC)   UTI (lower urinary tract infection)   Discharge Condition: Improved & Stable  Diet recommendation: Heart healthy and diabetic diet.  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 fib with RVR and symptomatic hypocalcemia from recent parathyroidectomy, presented to [facility] ED from SNF on 1/77/17 with complaints of worsening lower extremity edema, nonproductive cough, worsening dyspnea, low-grade fever and chills. Urine microscopy suggested UTI. Patient was admitted for management of decompensated CHF and UTI.   Hospital Course:    1. Acute on chronic combined systolic and diastolic CHF: 2-D echo 123XX123: Poor acoustic windows. Repeat echo 10/30/15: LV size normal. Mild LVH-rest of results as below.  No comment regarding LVEF. - 5031 mL since admission. Initially started on IV Lasix 80 mg every 12 hours. She was not adequately diuresing. Received 2 doses of Zaroxolyn 2.5 MG each on 1/30 & 1/31 with good effect. Although chest x-ray reads interstitial edema, patient has clinically improved and able to lie supine without dyspnea and does not complain of any dyspnea. Cardiology was consulted today for assessment of NSVT. They recommended continuing home dose of Lasix 80 mg twice a day and adding Zaroxolyn 2.5 MG weekly. Outpatient follow-up with cardiology/Dr. Virl Axe. 2. Escherichia coli Urinary tract infection, uncomplicated: Completed treatment with 5 days of IV Rocephin. 3. Type II DM with renal complications: continue slightly reduced dose Lantus and SSI. On 1/28, patient inadvertently received NovoLog 3 units in the morning by RN without adverse effects. Patient was updated and was apologized to. Fluctuating but reasonable inpatient control. 4. Chronic respiratory failure: Continue nightly oxygen at 2 L/m via nasal cannula. Patient apparently was not getting nightly oxygen at SNF prior to admission. 5. Hypocalcemia, status post recent parathyroidectomy: Corrected serum calcium is improved from 8 > 8.7>8.9 >9.4. Increased oral calcium supplementation during this admission. Monitor electrolytes periodically as outpatient at SNF and adjust calcium dose as needed. 6. Tachybradycardia syndrome status post PPM. Follows with Dr. Virl Axe. 7. Permanent A. fib with controlled ventricular rate: Continue Apixaban and s/p PPM.  8. HLD: Continue statins 9. Hypothyroid: Continue Synthroid. TSH 1.969. 10. Stage III chronic kidney disease: Prior Baseline creatinine is not clear but may be in the 1.2-1.4 range. Follow closely. Creatinine has normalized 11. Hypokalemia/hypomagnesemia: Replaced aggressively prior to discharge. Outpatient follow-up. 12. NSVT: Patient had 2 episodes of asymptomatic NSVT (  10  beat & 15 beat) since last night. Replaced potassium and magnesium aggressively. Continued metoprolol. Cardiology was consulted. Discussed with Dr. Irish Lack: Replace electrolytes as we are doing, continue current medication regimen and outpatient follow-up with primary EPS cardiologist.    Family Communication: Discussed with patient's son-in-law at bedside..    Consultants:  Cardiology  Procedures:  2-D echo 10/30/15: Study Conclusions  - Left ventricle: The cavity size was normal. Wall thickness was increased in a pattern of mild LVH. - Mitral valve: There was mild regurgitation. - Left atrium: The atrium was moderately dilated. - Right ventricle: The cavity size was moderately dilated. Systolic function was moderately to severely reduced. - Right atrium: The atrium was moderately to severely dilated. - Tricuspid valve: There was moderate regurgitation. - Pulmonary arteries: PA peak pressure: 70 mm Hg (S). - Pericardium, extracardiac: A small pericardial effusion was identified.    Discharge Exam:  Complaints:  No dyspnea. Intermittent dry hawking cough. Leg edema decreasing but still has some. Denies any other complaints.  Filed Vitals:   10/31/15 2138 10/31/15 2153 11/01/15 0530 11/01/15 1223  BP: 123/71  117/74 110/71  Pulse: 102  90 98  Temp: 99.2 F (37.3 C)  99 F (37.2 C) 98.9 F (37.2 C)  TempSrc: Oral  Oral Oral  Resp: 18  18 18   Height:      Weight:   121.246 kg (267 lb 4.8 oz)   SpO2: 91% 93% 91% 91%    General exam: Moderately built and morbidly obese pleasant elderly female lying comfortably supine in bed. Respiratory system: clear to auscultation. No increased work of breathing. Cardiovascular system: S1 & S2 heard, irregularly irregular. No JVD, murmurs, gallops, clicks. Trace bilateral leg edema-decreasing (unable to say how much of this is related to subcutaneous fat from obesity) . Telemetry: A. fib in the 90s with BBB morphology and  occasional PVCs. 2 episodes of asymptomatic NSVT (10 beat & 15 beat) since last night. Gastrointestinal system: Abdomen is nondistended, soft and nontender. Normal bowel sounds heard. Central nervous system: Alert and oriented. No focal neurological deficits. Extremities: Symmetric 5 x 5 power.  Discharge Instructions      Discharge Instructions    (HEART FAILURE PATIENTS) Call MD:  Anytime you have any of the following symptoms: 1) 3 pound weight gain in 24 hours or 5 pounds in 1 week 2) shortness of breath, with or without a dry hacking cough 3) swelling in the hands, feet or stomach 4) if you have to sleep on extra pillows at night in order to breathe.    Complete by:  As directed      Call MD for:  difficulty breathing, headache or visual disturbances    Complete by:  As directed      Call MD for:  extreme fatigue    Complete by:  As directed      Call MD for:  persistant dizziness or light-headedness    Complete by:  As directed      Call MD for:  persistant nausea and vomiting    Complete by:  As directed      Call MD for:  severe uncontrolled pain    Complete by:  As directed      Call MD for:  temperature >100.4    Complete by:  As directed      Diet - low sodium heart healthy    Complete by:  As directed      Diet Carb Modified  Complete by:  As directed      Discharge instructions    Complete by:  As directed   Continue oxygen via nasal cannula at 2 L/m at bedtime.     Increase activity slowly    Complete by:  As directed             Medication List    STOP taking these medications        HYDROcodone-acetaminophen 5-325 MG tablet  Commonly known as:  NORCO/VICODIN      TAKE these medications        albuterol 108 (90 Base) MCG/ACT inhaler  Commonly known as:  PROVENTIL HFA;VENTOLIN HFA  Inhale 2 puffs into the lungs every 6 (six) hours as needed for wheezing or shortness of breath.     atorvastatin 80 MG tablet  Commonly known as:  LIPITOR  Take 80 mg  by mouth at bedtime.     calcium carbonate 1250 (500 Ca) MG tablet  Commonly known as:  OS-CAL - dosed in mg of elemental calcium  Take 4 tablets (2,000 mg of elemental calcium total) by mouth 3 (three) times daily with meals.     colchicine 0.6 MG tablet  Take 1-2 tablets by mouth daily as needed (gout). TAKE 2 TABLETS BY MOUTH AS ONE DOSE, THEN 1 TABLET IN 1 HOUR FOR ACUTE GOUT EPISODE     diphenhydramine-acetaminophen 25-500 MG Tabs tablet  Commonly known as:  TYLENOL PM  Take 2 tablets by mouth at bedtime as needed (sleep/pain).     ELIQUIS 5 MG Tabs tablet  Generic drug:  apixaban  TAKE 1 TABLET BY MOUTH TWICE A DAY     Fiber Caps  Take 1 tablet by mouth at bedtime.     Fluticasone-Salmeterol 250-50 MCG/DOSE Aepb  Commonly known as:  ADVAIR  Inhale 1 puff into the lungs every 12 (twelve) hours.     folic acid 1 MG tablet  Commonly known as:  FOLVITE  Take 1 mg by mouth daily.     furosemide 40 MG tablet  Commonly known as:  LASIX  TAKE 80 MG BY MOUTH TWICE A DAY     insulin aspart 100 UNIT/ML injection  Commonly known as:  novoLOG  Inject 0-9 Units into the skin 3 (three) times daily with meals. CBG < 70: implement hypoglycemia protocol CBG 70 - 120: 0 units CBG 121 - 150: 1 unit CBG 151 - 200: 2 units CBG 201 - 250: 3 units CBG 251 - 300: 5 units CBG 301 - 350: 7 units CBG 351 - 400: 9 units CBG > 400: call MD.     insulin glargine 100 UNIT/ML injection  Commonly known as:  LANTUS  Inject 0.12 mLs (12 Units total) into the skin every morning.     ipratropium-albuterol 0.5-2.5 (3) MG/3ML Soln  Commonly known as:  DUONEB  Take 3 mLs by nebulization every 6 (six) hours as needed (wheezing and or shortness of breath).     KLOR-CON M20 20 MEQ tablet  Generic drug:  potassium chloride SA  TAKE 1 TABLET BY MOUTH TWICE DAILY     levothyroxine 75 MCG tablet  Commonly known as:  SYNTHROID, LEVOTHROID  Take 75 mcg by mouth daily.     losartan 50 MG tablet  Commonly  known as:  COZAAR  Take 50 mg by mouth at bedtime.     metolazone 2.5 MG tablet  Commonly known as:  ZAROXOLYN  Take 1 tablet (2.5 mg total) by  mouth once a week. Every Friday.     metoprolol 100 MG tablet  Commonly known as:  LOPRESSOR  TAKE 1 TABLET (100 MG TOTAL) BY MOUTH 2 (TWO) TIMES DAILY.     multivitamin with minerals tablet  Take 1 tablet by mouth daily.     nitroGLYCERIN 0.4 MG SL tablet  Commonly known as:  NITROSTAT  Place 1 tablet (0.4 mg total) under the tongue every 5 (five) minutes as needed for chest pain.     omeprazole 20 MG tablet  Commonly known as:  PRILOSEC OTC  Take 20 mg by mouth daily.     RANEXA 500 MG 12 hr tablet  Generic drug:  ranolazine  Take 1 tablet by mouth 2 (two) times daily.     sulfaSALAzine 500 MG tablet  Commonly known as:  AZULFIDINE  Take 1,000 mg by mouth 2 (two) times daily.     Tiotropium Bromide Monohydrate 2.5 MCG/ACT Aers  Commonly known as:  SPIRIVA RESPIMAT  Inhale 2 puffs into the lungs daily.     tiZANidine 4 MG tablet  Commonly known as:  ZANAFLEX  Take 4 mg by mouth every 8 (eight) hours as needed for muscle spasms.     Vitamin D (Ergocalciferol) 50000 units Caps capsule  Commonly known as:  DRISDOL  Take 50,000 Units by mouth every Sunday.          The results of significant diagnostics from this hospitalization (including imaging, microbiology, ancillary and laboratory) are listed below for reference.    Significant Diagnostic Studies: Dg Chest 2 View  10/27/2015  CLINICAL DATA:  Hypocalcemia, parathyroid surgery last October, coughing, vomiting, question pneumonia, lower extremity edema, history coronary artery disease, atrial fibrillation, diabetes mellitus, COPD, CHF, former smoker EXAM: CHEST  2 VIEW COMPARISON:  10/10/2015 FINDINGS: LEFT subclavian transvenous pacemaker leads project at RIGHT atrium and RIGHT ventricle. Enlargement of cardiac silhouette with minimal pulmonary vascular congestion.  Mediastinal contours normal. Bronchitic changes without pulmonary infiltrate, pleural effusion or pneumothorax. Bones demineralized. IMPRESSION: Enlargement of cardiac silhouette post pacemaker. Bronchitic changes without infiltrate. Electronically Signed   By: Lavonia Dana M.D.   On: 10/27/2015 13:47   Dg Chest 2 View  10/10/2015  CLINICAL DATA:  Shortness of breath and cough.  Initial encounter. EXAM: CHEST  2 VIEW COMPARISON:  PA and lateral chest 03/28/2015 02/15/2015. FINDINGS: Pacing device is again seen. There is cardiomegaly and mild vascular congestion without frank edema. No consolidative process, pneumothorax or effusion. No focal bony abnormality. IMPRESSION: No acute abnormality. Cardiomegaly and pulmonary vascular congestion. Electronically Signed   By: Inge Rise M.D.   On: 10/10/2015 12:51   Dg Chest Port 1 View  11/01/2015  CLINICAL DATA:  Congestive heart failure.  Cough. EXAM: PORTABLE CHEST 1 VIEW COMPARISON:  10/27/2015 FINDINGS: Cardial pacemaker is stable. Postsurgical changes at the thoracic inlet are seen. The cardiac silhouette is enlarged. Mediastinal contours appear intact. Atherosclerotic calcifications of the aortic arch are seen. There is no evidence of focal airspace consolidation, pleural effusion or pneumothorax. There is thickening of the interstitial markings likely related to a interstitial pulmonary edema. Osseous structures are without acute abnormality. Soft tissues are grossly normal. IMPRESSION: Enlarged cardiac silhouette with interstitial pulmonary edema. Electronically Signed   By: Fidela Salisbury M.D.   On: 11/01/2015 09:08    Microbiology: Recent Results (from the past 240 hour(s))  Urine culture     Status: None   Collection Time: 10/27/15  8:25 PM  Result Value Ref Range  Status   Specimen Description URINE, CLEAN CATCH  Final   Special Requests NONE  Final   Culture >=100,000 COLONIES/mL ESCHERICHIA COLI  Final   Report Status 10/30/2015 FINAL   Final   Organism ID, Bacteria ESCHERICHIA COLI  Final      Susceptibility   Escherichia coli - MIC*    AMPICILLIN <=2 SENSITIVE Sensitive     CEFAZOLIN <=4 SENSITIVE Sensitive     CEFTRIAXONE <=1 SENSITIVE Sensitive     CIPROFLOXACIN <=0.25 SENSITIVE Sensitive     GENTAMICIN >=16 RESISTANT Resistant     IMIPENEM <=0.25 SENSITIVE Sensitive     NITROFURANTOIN <=16 SENSITIVE Sensitive     TRIMETH/SULFA <=20 SENSITIVE Sensitive     AMPICILLIN/SULBACTAM <=2 SENSITIVE Sensitive     PIP/TAZO <=4 SENSITIVE Sensitive     * >=100,000 COLONIES/mL ESCHERICHIA COLI     Labs: Basic Metabolic Panel:  Recent Labs Lab 10/28/15 0348 10/29/15 0358 10/30/15 0510 10/31/15 0510 11/01/15 0420 11/01/15 1004  NA 138 137 139 136 134*  --   K 4.4 4.2 4.0 3.4* 3.4*  --   CL 102 97* 98* 92* 88*  --   CO2 24 27 28 30  33*  --   GLUCOSE 107* 127* 87 94 115*  --   BUN 27* 33* 28* 25* 22*  --   CREATININE 1.47* 1.42* 1.21* 1.13* 1.05*  --   CALCIUM 7.0* 7.3* 7.5* 7.6* 8.1*  --   MG  --   --   --   --   --  1.5*  PHOS  --   --  3.8 3.7 3.6  --    Liver Function Tests:  Recent Labs Lab 10/27/15 1317 10/29/15 0358 10/30/15 0510 10/31/15 0510 11/01/15 0420  AST 31 28  --   --   --   ALT 27 22  --   --   --   ALKPHOS 190* 159*  --   --   --   BILITOT 1.8* 0.8  --   --   --   PROT 7.1 6.1*  --   --   --   ALBUMIN 2.7* 2.3* 2.3* 2.2* 2.4*   No results for input(s): LIPASE, AMYLASE in the last 168 hours. No results for input(s): AMMONIA in the last 168 hours. CBC:  Recent Labs Lab 10/27/15 1317 10/28/15 0147  WBC 14.4* 9.0  HGB 15.3* 14.0  HCT 45.9 41.8  MCV 98.1 97.0  PLT 387 310   Cardiac Enzymes:  Recent Labs Lab 10/27/15 2031 10/28/15 0147 10/28/15 0740  TROPONINI 0.03 0.03 0.03   BNP: BNP (last 3 results)  Recent Labs  02/15/15 1129 03/28/15 1857 10/27/15 1317  BNP 1515.5* 224.0* 1318.1*    ProBNP (last 3 results) No results for input(s): PROBNP in the last 8760  hours.  CBG:  Recent Labs Lab 10/31/15 1156 10/31/15 1652 10/31/15 2140 11/01/15 0553 11/01/15 1129  GLUCAP 120* 177* 157* 125* 119*       Signed:  Vernell Leep, MD, FACP, FHM. [facility] Pager [phone]  If 7PM-7AM, please contact night-coverage [email or web address] Password [facility] 11/01/2015, 12:57 PM

## 2015-11-10 NOTE — Clinical Social Work Placement (Signed)
   CLINICAL SOCIAL WORK PLACEMENT  NOTE  Date:  11/01/2015 Patient Details  Name: [name] MRN: [id number] Date of Birth: 1940/11/11  Clinical Social Work is seeking post-discharge placement for this patient at the [place] level of care (*CSW will initial, date and re-position this form in  chart as items are completed):  Yes   Patient/family provided with [place] Work Department's list of facilities offering this level of care within the geographic area requested by the patient (or if unable, by the patient's family).  Yes   Patient/family informed of their freedom to choose among providers that offer the needed level of care, that participate in Medicare, Medicaid or managed care program needed by the patient, have an available bed and are willing to accept the patient.  Yes   Patient/family informed of [facility]'s ownership interest in [facility] and [facility], as well as of the fact that they are under no obligation to receive care at these facilities.  PASRR submitted to EDS on       PASRR number received on       Existing PASRR number confirmed on 10/30/15     FL2 transmitted to all facilities in geographic area requested by pt/family on 10/30/15     FL2 transmitted to all facilities within larger geographic area on       Patient informed that his/her managed care company has contracts with or will negotiate with certain facilities, including the following:   [facility])     Yes   Patient/family informed of bed offers received.  Patient [facility] bed at [facility]     Physician recommends and patient chooses bed at      Patient to be transferred to [facility] on 11/01/15.  Patient to be transferred to facility by Ambulance [facility])     Patient family notified on 11/01/15 of transfer.  Name of family member notified:  Patient states she called her family about d/c. She is alert  and oriented. Does not wish CSW to call them.     PHYSICIAN Please prepare prescriptions, Please prepare priority discharge summary, including medications, Please sign FL2     Additional Comment:    _______________________________________________ Williemae Area, LCSW [phone]

## 2015-11-10 NOTE — Progress Notes (Signed)
Pt transferred to: [facility]. Anticipated date of transfer:11/01/15 Transported by: Ambulance ([facility]) Time Tentatively Scheduled for:1:45 PM Family notified: Patient declined CSW to call her family; she has notified them Report # given to nursing to call report. DC summary has been sent to facility for review. Patient is agreeable to d/c to SNF and is pleased with DC plan. No further CSW needs identified.  CSW signing off.  Kendell Bane, LCSW [phone]

## 2015-11-17 IMAGING — US US SOFT TISSUE HEAD/NECK
1 series · 13 of 25 positions shown · non-contrast
Comparison: Nuclear medicine study 11/21/2014. No prior thyroid
ultrasound.

CLINICAL DATA: 73-year-old female with a history of parathyroid
adenoma.

Parathyroid nuclear medicine study demonstrates no localization of a
parathyroid adenoma.
EXAM:
THYROID ULTRASOUND
TECHNIQUE: Ultrasound examination of the thyroid gland and adjacent soft
tissues was performed.

[Series 1: us soft tissue head/neck · 0.09mm/px · 13 of 57 slices shown]
[im 1/57]
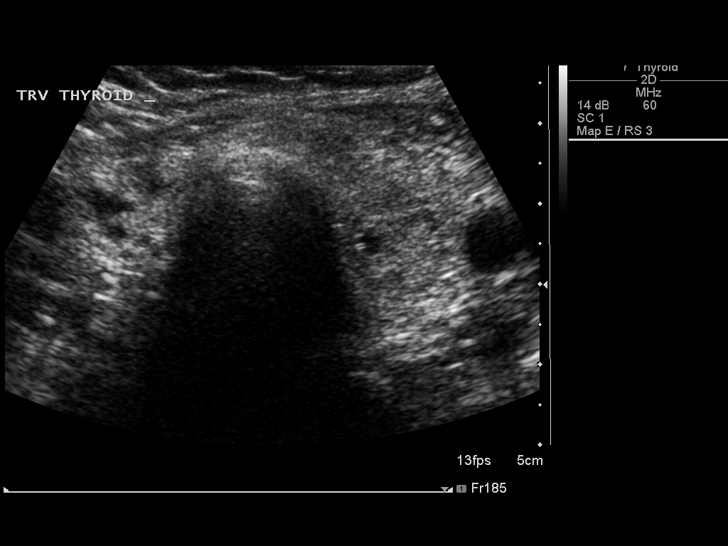
[im 5/57]
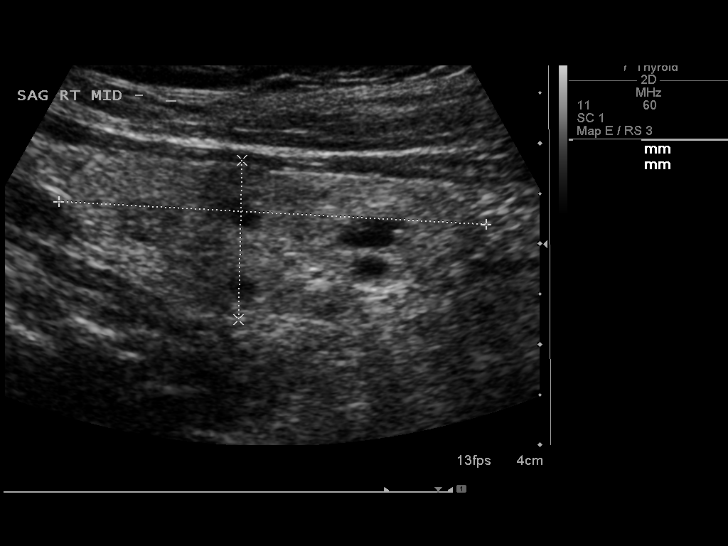
[im 10/57]
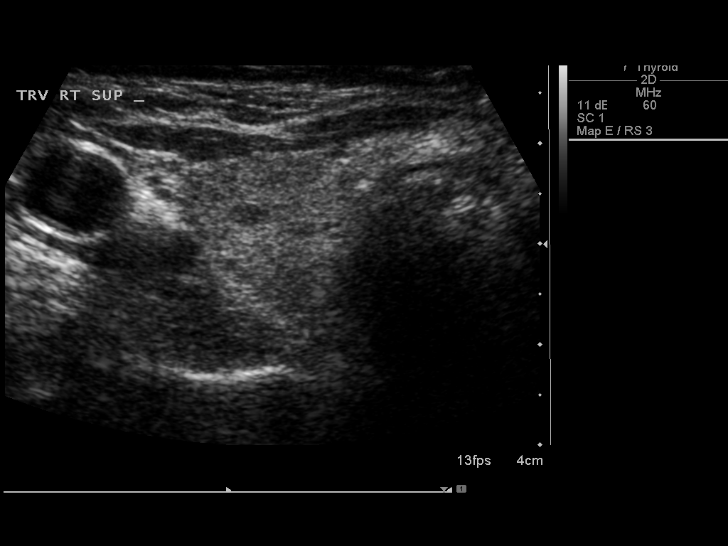
[im 15/57]
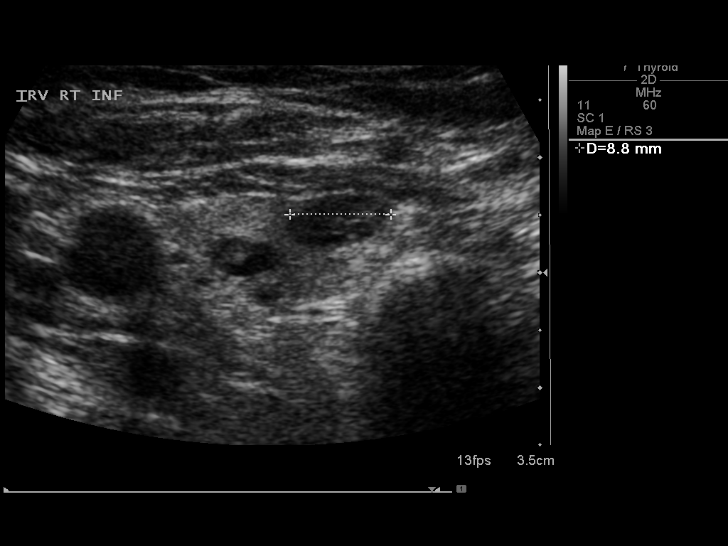
[im 19/57]
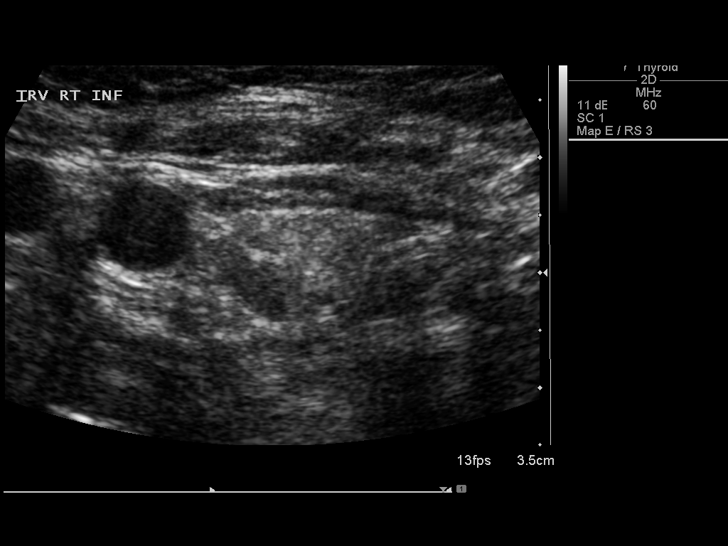
[im 24/57]
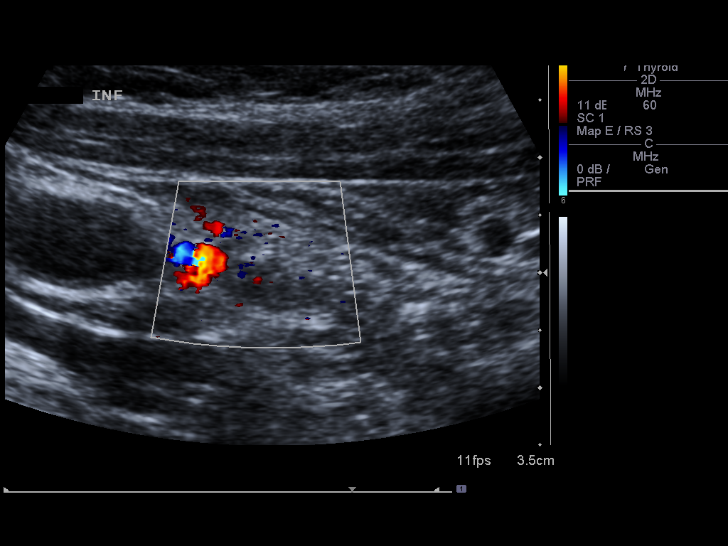
[im 29/57]
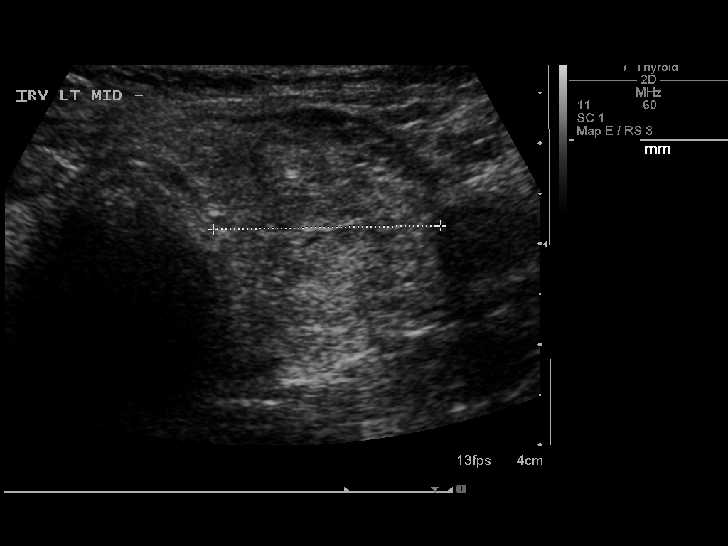
[im 33/57]
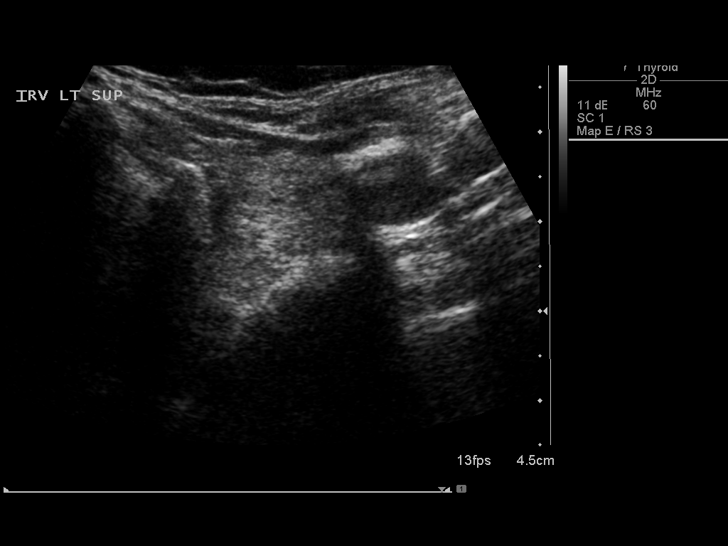
[im 38/57]
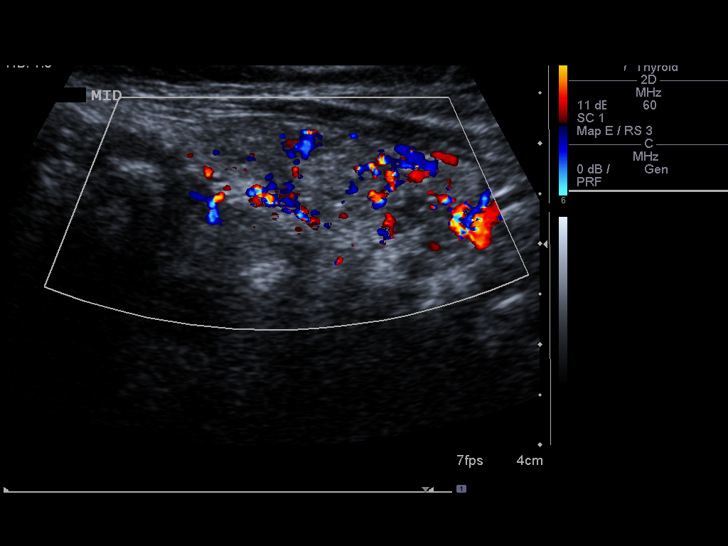
[im 43/57]
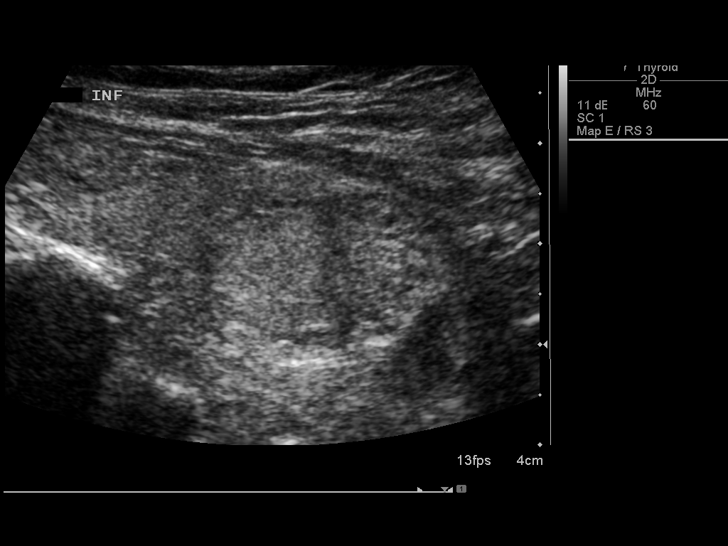
[im 47/57]
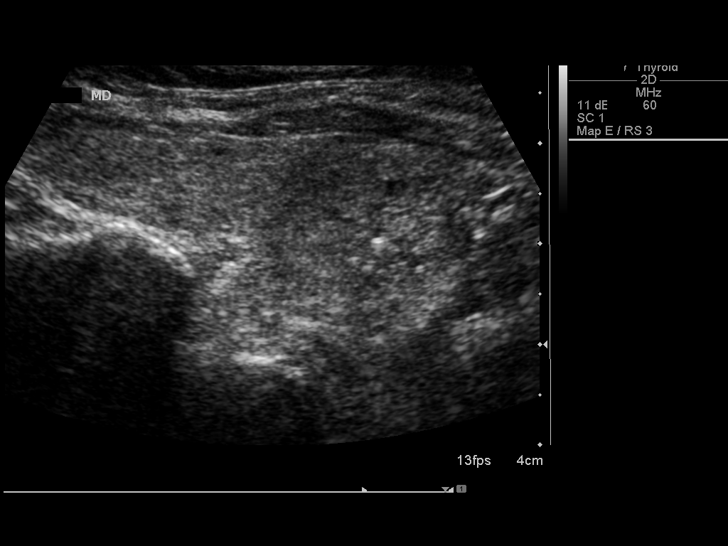
[im 52/57]
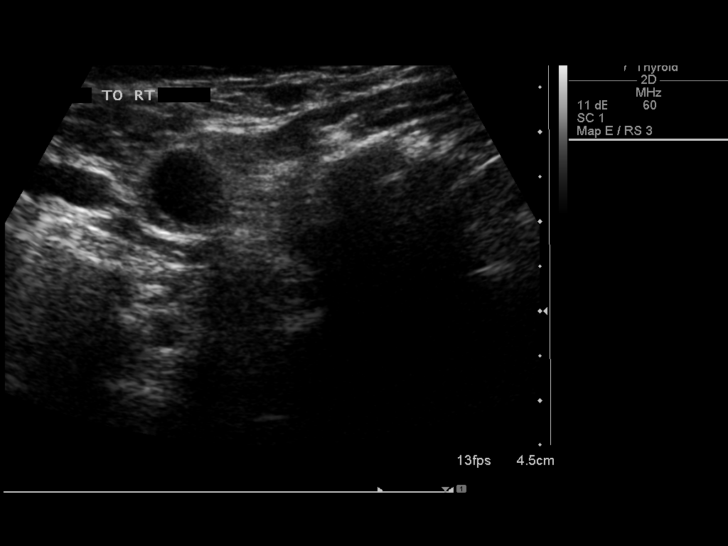
[im 57/57]
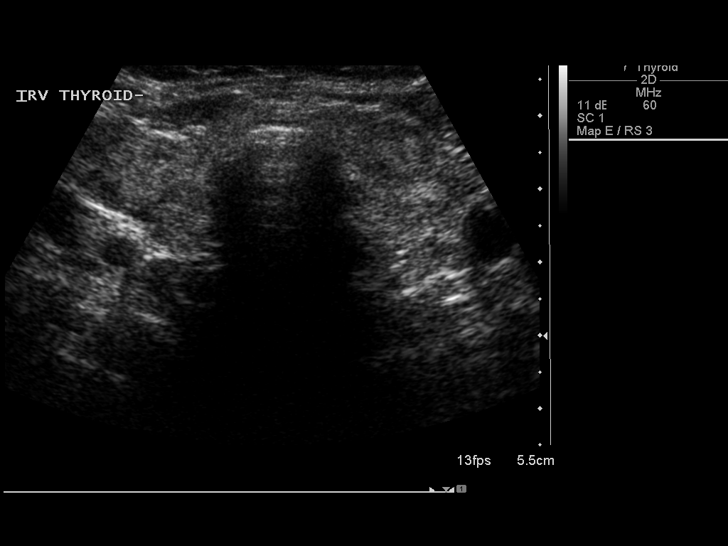

[13 of 25 positions shown; findings below may reference images not displayed]

FINDINGS: Right thyroid lobe

Measurements: 4.3 cm x 1.6 cm x 2.3 cm. Heterogeneous echotexture of
the right thyroid lobe. Multiple small sub cm hypoechoic and
hyperechoic nodules, majority of which are measuring less than 1 cm.
Dominant lesion is at the inferior aspect of the right thyroid lobe,
exophytic to the thyroid to chew and inseparable from the lower
lobe. Uniformly ECHO it. Measures 9 mm x 3 mm x 10 mm.

Left thyroid lobe

Measurements: 4.8 cm x 1.9 cm x 2.3 cm. Heterogeneous echotexture of
the left thyroid. Solid nodule at the level of the isthmus measures
1.7 cm x 1.0 cm x 1.4 cm. Solid nodule at the inferior left thyroid
measures 1.8 cm x 1.5 cm x 2.4 cm. No internal calcifications.

Isthmus

Thickness: 4 mm.  No nodules visualized.

Lymphadenopathy

None visualized.
IMPRESSION: Multinodular thyroid. Dominant nodules on the left include a nodule
at the level of the isthmus and a lower left thyroid lobe nodule.
Each of these meet criteria for biopsy. Ultrasound-guided fine
needle aspiration should be considered, as per the consensus
statement: Management of Thyroid Nodules Detected at US: Society of
Radiologists in Ultrasound Consensus Conference Statement. Radiology
5558; [DATE].

On the right, there is a 1 cm uniformly echoic nodule exophytic to
the lower right thyroid lobe and inseparable from the tissue. This
this could be a benign-appearing right thyroid nodule or
alternatively a parathyroid gland.

## 2015-11-20 ENCOUNTER — Ambulatory Visit (INDEPENDENT_AMBULATORY_CARE_PROVIDER_SITE_OTHER): Payer: Medicare Other | Admitting: Internal Medicine

## 2015-11-20 ENCOUNTER — Encounter: Payer: Self-pay | Admitting: Internal Medicine

## 2015-11-20 ENCOUNTER — Other Ambulatory Visit: Payer: Medicare Other

## 2015-11-20 VITALS — BP 116/70 | HR 105 | Ht 72.0 in | Wt 258.8 lb

## 2015-11-20 DIAGNOSIS — J449 Chronic obstructive pulmonary disease, unspecified: Secondary | ICD-10-CM

## 2015-11-20 LAB — PULMONARY FUNCTION TEST
FEF 25-75 POST: 0.6 L/s
FEF 25-75 Pre: 0.55 L/sec
FEF2575-%CHANGE-POST: 10 %
FEF2575-%Pred-Post: 31 %
FEF2575-%Pred-Pre: 28 %
FEV1-%CHANGE-POST: 3 %
FEV1-%PRED-PRE: 49 %
FEV1-%Pred-Post: 51 %
FEV1-Post: 1.3 L
FEV1-Pre: 1.26 L
FEV1FVC-%CHANGE-POST: 1 %
FEV1FVC-%PRED-PRE: 76 %
FEV6-%Change-Post: 2 %
FEV6-%PRED-PRE: 66 %
FEV6-%Pred-Post: 67 %
FEV6-POST: 2.17 L
FEV6-Pre: 2.12 L
FEV6FVC-%Change-Post: 0 %
FEV6FVC-%PRED-POST: 102 %
FEV6FVC-%Pred-Pre: 102 %
FVC-%Change-Post: 1 %
FVC-%PRED-POST: 65 %
FVC-%PRED-PRE: 64 %
FVC-POST: 2.22 L
FVC-PRE: 2.18 L
POST FEV6/FVC RATIO: 98 %
PRE FEV6/FVC RATIO: 97 %
Post FEV1/FVC ratio: 59 %
Pre FEV1/FVC ratio: 58 %

## 2015-11-20 NOTE — Progress Notes (Signed)
Subjective:     Patient ID: [name], female   DOB: [birth year], 75 y.o.   MRN: [id number]  HPI   OV 11/20/2015  Chief Complaint  Patient presents with  . Follow-up    pt. states breathing is baseline. occ. SOB. occ. dry cough. occ. wheezing. denies chest pain/tightness.   75 year old female with moderate COPD FEV1 55% in 2013. Last seen by myself in summer 2016. Now presents for follow-up at son-in-law. In the interim has had parathyroid surgery and competitions of hypocalcemia and Escherichia coli urine infection according to her history and deconditioning requiring stay in the nursing home. Currently walking around with a walker. Presents for COPD follow up COPD stable. Up-to-date with vaccines. Compliant with inhalers and nebulizer. Had spirometry today post bronchodilator FEV1 is 51% and bordering on Gold stage III COPD. There are no other issues. Reviewed chart and noticed that she's not had an alpha-1 antitrypsin checked. Last chest x-ray 11/01/2015 with interstitial edema per report. No masses reported. Did not visualize this film.     has a past medical history of Nonischemic cardiomyopathy ([place]); Chronic diastolic heart failure ([place]); Morbid obesity ([place]); Hyperlipidemia; Tachy-brady syndrome ([place]); Chronic obstructive pulmonary disease ([place]); Osteoporosis; Depression; GERD (gastroesophageal reflux disease); Crohn's disease ([place]); Hypothyroidism; Seasonal allergies; Pacemaker; Atrial fibrillation ([place]); CAD (coronary artery disease); Coronary artery disease (06/15/2012); Atrial fibrillation ([place]) (12/18/2007); Morbid obesity with BMI of 40.0-44.9, adult ([place]) (06/22/2012); Diabetes mellitus ([place]) (06/22/2012); Diabetes mellitus without complication ([place]); COPD (chronic obstructive pulmonary disease) ([place]); Gout; Dysrhythmia; Peripheral vascular disease ([place]) (15); Obstructive sleep apnea; Shortness of breath dyspnea; CHF (congestive heart failure) ([place]); and On home oxygen therapy.   reports that she quit smoking about 29 years ago. She quit smokeless tobacco use about 28 years ago.  Past Surgical History  Procedure Laterality Date  . Pulmonary vein isolation  05/12/2008    RFCA atrial fibrillation  . Post ablation pseudoaneurysm      at A fib ablation  . Pacemaker insertion  2010    medtronic ADAPTA   . Tonsillectomy    . Embolectomy Right 08/16/2014    Procedure: EMBOLECTOMY- RIGHT BRACHIAL ARTERY;  Surgeon: Serafina Mitchell, MD;  Location: [facility] OR;  Service: Vascular;  Laterality: Right;  . Cardiac catheterization  08/2000    noncritical disease mid RCA, EF preserved  . Cardiac catheterization  05/29/2007    noncritical disease, EF 20-25%  . Cataract extraction w/ intraocular lens  implant, bilateral Bilateral 2014-2016  . Parathyroidectomy Right 01/30/2015    Procedure: PARATHYROIDECTOMY;  Surgeon: Armandina Gemma, MD;  Location: [facility];  Service: General;  Laterality: Right;  . Parathyroidectomy Left 10/03/2015    Left inferior parathyroidectomy w/neck exploration  . Parathyroidectomy Left 10/03/2015    Procedure: LEFT PARATHYROID EXPLORATION AND  PARATHYROIDECTOMY;  Surgeon: Armandina Gemma, MD;  Location: [facility];  Service: General;  Laterality: Left;    No Known Allergies  Immunization History  Administered Date(s) Administered  . Influenza-Unspecified 06/30/2014, 07/01/2015    Family History  Problem Relation Age of Onset  . Breast cancer Mother   . Heart disease Father   . Emphysema Father      Current outpatient prescriptions:  .  albuterol (PROVENTIL HFA;VENTOLIN HFA) 108 (90 BASE) MCG/ACT inhaler, Inhale 2 puffs into the lungs every 6 (six) hours as needed for wheezing or shortness of breath., Disp: , Rfl:  .  atorvastatin (LIPITOR) 80 MG tablet, Take 80 mg by mouth at bedtime.  , Disp: , Rfl:  .  calcium carbonate (OS-CAL - DOSED IN MG OF ELEMENTAL CALCIUM) 1250 (500 Ca) MG tablet, Take 4 tablets (2,000 mg of elemental calcium total) by mouth 3 (three) times  daily with meals., Disp: , Rfl:  .  colchicine 0.6 MG tablet, Take 1-2 tablets by mouth daily as needed (gout). TAKE 2 TABLETS BY MOUTH AS ONE DOSE, THEN 1 TABLET IN 1 HOUR FOR ACUTE GOUT EPISODE, Disp: , Rfl: 2 .  diphenhydramine-acetaminophen (TYLENOL PM) 25-500 MG TABS tablet, Take 2 tablets by mouth at bedtime as needed (sleep/pain)., Disp: , Rfl:  .  ELIQUIS 5 MG TABS tablet, TAKE 1 TABLET BY MOUTH TWICE A DAY, Disp: 60 tablet, Rfl: 5 .  Fiber CAPS, Take 1 tablet by mouth at bedtime. , Disp: , Rfl:  .  Fluticasone-Salmeterol (ADVAIR) 250-50 MCG/DOSE AEPB, Inhale 1 puff into the lungs every 12 (twelve) hours., Disp: 60 each, Rfl: 6 .  folic acid (FOLVITE) 1 MG tablet, Take 1 mg by mouth daily., Disp: , Rfl: 12 .  furosemide (LASIX) 40 MG tablet, TAKE 80 MG BY MOUTH TWICE A DAY, Disp: 270 tablet, Rfl: 2 .  HYDROcodone-acetaminophen (NORCO/VICODIN) 5-325 MG tablet, TAKE 1 TO 2 TABLETS BY MOUTH EVERY 4 HOURS AS NEEDED FOR MODERATE PAIN, Disp: , Rfl: 0 .  insulin aspart (NOVOLOG) 100 UNIT/ML injection, Inject 0-9 Units into the skin 3 (three) times daily with meals. CBG < 70: implement hypoglycemia protocol CBG 70 - 120: 0 units CBG 121 - 150: 1 unit CBG 151 - 200: 2 units CBG 201 - 250: 3 units CBG 251 - 300: 5 units CBG 301 - 350: 7 units CBG 351 - 400: 9 units CBG > 400: call MD. (Patient taking differently: Inject 0-9 Units into the skin once. CBG < 70: implement hypoglycemia protocol CBG 70 - 120: 0 units CBG 121 - 150: 1 unit CBG 151 - 200: 2 units CBG 201 - 250: 3 units CBG 251 - 300: 5 units CBG 301 - 350: 7 units CBG 351 - 400: 9 units CBG > 400: call MD.), Disp: , Rfl:  .  insulin glargine (LANTUS) 100 UNIT/ML injection, Inject 0.12 mLs (12 Units total) into the skin every morning., Disp: , Rfl:  .  ipratropium-albuterol (DUONEB) 0.5-2.5 (3) MG/3ML SOLN, Take 3 mLs by nebulization every 6 (six) hours as needed (wheezing and or shortness of breath)., Disp: 360 mL, Rfl: 0 .  KLOR-CON M20 20 MEQ  tablet, TAKE 1 TABLET BY MOUTH TWICE DAILY, Disp: 180 tablet, Rfl: 3 .  levothyroxine (SYNTHROID, LEVOTHROID) 75 MCG tablet, Take 75 mcg by mouth daily., Disp: , Rfl:  .  losartan (COZAAR) 50 MG tablet, Take 50 mg by mouth at bedtime. , Disp: , Rfl:  .  metoprolol (LOPRESSOR) 100 MG tablet, TAKE 1 TABLET (100 MG TOTAL) BY MOUTH 2 (TWO) TIMES DAILY., Disp: 180 tablet, Rfl: 3 .  Multiple Vitamins-Minerals (MULTIVITAMIN WITH MINERALS) tablet, Take 1 tablet by mouth daily., Disp: , Rfl:  .  omeprazole (PRILOSEC OTC) 20 MG tablet, Take 20 mg by mouth daily.  , Disp: , Rfl:  .  RANEXA 500 MG 12 hr tablet, Take 1 tablet by mouth 2 (two) times daily., Disp: , Rfl:  .  sulfaSALAzine (AZULFIDINE) 500 MG tablet, Take 1,000 mg by mouth 2 (two) times daily., Disp: , Rfl: 11 .  Tiotropium Bromide Monohydrate (SPIRIVA RESPIMAT) 2.5 MCG/ACT AERS, Inhale 2 puffs into the lungs daily., Disp: 4 g, Rfl: 6 .  tiZANidine (ZANAFLEX) 4 MG  tablet, Take 4 mg by mouth every 8 (eight) hours as needed for muscle spasms. , Disp: , Rfl: 1 .  Vitamin D, Ergocalciferol, (DRISDOL) 50000 units CAPS capsule, Take 50,000 Units by mouth every Sunday., Disp: , Rfl: 1 .  nitroGLYCERIN (NITROSTAT) 0.4 MG SL tablet, Place 1 tablet (0.4 mg total) under the tongue every 5 (five) minutes as needed for chest pain., Disp: 25 tablet, Rfl: 1 .  [DISCONTINUED] allopurinol (ZYLOPRIM) 100 MG tablet, Take 100 mg by mouth daily.  , Disp: , Rfl:      Review of Systems     Objective:   Physical Exam  Filed Vitals:   11/20/15 1621  BP: 116/70  Pulse: 105  Height: 6' (1.829 m)  Weight: 258 lb 12.8 oz (117.391 kg)  SpO2: 92%    Gen. exam: Obese and deconditioned looking. Sitting Neurological: Alert and oriented in 3. Speech normal Skin exam: Intact in the exposed areas thigh and legs Respiratory: Clear to auscultation bilaterally no wheezes Cardiovascular: Normal heart sounds Musculoskeletal: Mild pedal edema present no cyanosis no  clubbing Abdomen: Obese and soft     Assessment:       ICD-9-CM ICD-10-CM   1. COPD, severe ([place]) 496 J44.9        Plan:      5% drop in lung function since 2013 which could be just due to normal aging process; currently at 50% Overall currently COPD stable  Plan - Get alpha 1 antitrypsin genetic test for COPD \- Continue nebulizers and inhaler as before - Please let us know if you, get exposed to flu   Follow-up - 5 months or sooner if needed    Dr. Brand Males, M.D., [facility].C.P Pulmonary and Critical Care Medicine Staff Physician [facility] Pager: [phone], If no answer or between  15:00h - 7:00h: call [phone]  11/20/2015 5:01 PM

## 2015-11-20 NOTE — Patient Instructions (Signed)
ICD-9-CM ICD-10-CM   1. COPD, severe ([place]) 496 J44.9     5% drop in lung function since 2013 which could be just due to normal aging process; currently at 50% Overall currently COPD stable  Plan - Get alpha 1 antitrypsin genetic test for COPD \- Continue nebulizers and inhaler as before - Please let us know if you, get exposed to flu   Follow-up - 5 months or sooner if needed

## 2015-11-20 NOTE — Progress Notes (Signed)
Spirometry pre and post done today. 

## 2015-11-24 LAB — ALPHA-1 ANTITRYPSIN PHENOTYPE: A-1 Antitrypsin: 146 mg/dL (ref 83–199)

## 2015-11-28 NOTE — Progress Notes (Signed)
Quick Note:  lmtcb for pt. ______ 

## 2015-12-01 NOTE — Progress Notes (Signed)
Quick Note:  lmtcb for pt. ______ 

## 2015-12-03 NOTE — Progress Notes (Signed)
This encounter was created in error - please disregard.

## 2015-12-04 ENCOUNTER — Encounter: Payer: Medicare Other | Admitting: Cardiology

## 2015-12-05 NOTE — Progress Notes (Signed)
Quick Note:  lmtcb for pt. ______ 

## 2015-12-06 ENCOUNTER — Encounter: Payer: Self-pay | Admitting: Emergency Medicine

## 2015-12-06 NOTE — Progress Notes (Signed)
Quick Note:  lmtcb for pt. Due to several unsuccessful attempts to reach pt to relay results, a letter has been placed in out going mail to have pt return call. Will sign off. ______

## 2015-12-20 ENCOUNTER — Encounter: Payer: Self-pay | Admitting: Internal Medicine

## 2015-12-20 ENCOUNTER — Ambulatory Visit (INDEPENDENT_AMBULATORY_CARE_PROVIDER_SITE_OTHER): Payer: Medicare Other | Admitting: *Deleted

## 2015-12-20 DIAGNOSIS — I495 Sick sinus syndrome: Secondary | ICD-10-CM | POA: Diagnosis not present

## 2015-12-20 DIAGNOSIS — I482 Chronic atrial fibrillation, unspecified: Secondary | ICD-10-CM

## 2015-12-20 LAB — CUP PACEART INCLINIC DEVICE CHECK
Battery Impedance: 445 Ohm
Battery Voltage: 2.79 V
Brady Statistic RV Percent Paced: 20 %
Lead Channel Impedance Value: 594 Ohm
Lead Channel Impedance Value: 67 Ohm
Lead Channel Pacing Threshold Amplitude: 1.25 V
Lead Channel Pacing Threshold Pulse Width: 0.4 ms
Lead Channel Sensing Intrinsic Amplitude: 11.2 mV
Lead Channel Setting Pacing Amplitude: 2.5 V
MDC IDC LEAD IMPLANT DT: 20100614
MDC IDC LEAD LOCATION: 753860
MDC IDC MSMT BATTERY REMAINING LONGEVITY: 108 mo
MDC IDC MSMT LEADCHNL RV PACING THRESHOLD AMPLITUDE: 1.125 V
MDC IDC MSMT LEADCHNL RV PACING THRESHOLD PULSEWIDTH: 0.4 ms
MDC IDC SESS DTM: 20170322151706
MDC IDC SET LEADCHNL RV PACING PULSEWIDTH: 0.4 ms
MDC IDC SET LEADCHNL RV SENSING SENSITIVITY: 4 mV

## 2015-12-20 NOTE — Progress Notes (Signed)
Pacemaker check in clinic. Normal device function. Threshold, sensing, and impedance consistent with previous measurements. Device programmed to maximize longevity. Permanent AF + Eliquis. 987 high ventricular rates noted (~31% >100bpm)--max dur. 10 mins 45 sec, Max Avg V 156--AF/RVR + Lopressor 100. Device programmed at appropriate safety margins. Histogram distribution appropriate for patient activity level. Device programmed to optimize intrinsic conduction. Estimated longevity 9 years. Patient will follow up with [facility] in 01-2016.

## 2015-12-24 ENCOUNTER — Other Ambulatory Visit: Payer: Self-pay | Admitting: Internal Medicine

## 2015-12-28 ENCOUNTER — Other Ambulatory Visit: Payer: Self-pay | Admitting: Internal Medicine

## 2016-01-05 ENCOUNTER — Inpatient Hospital Stay (HOSPITAL_COMMUNITY)
Admission: EM | Admit: 2016-01-05 | Discharge: 2016-01-12 | DRG: 286 | Disposition: A | Discharge status: Skilled Nursing Facility | Payer: Medicare Other | Attending: Internal Medicine | Admitting: Internal Medicine

## 2016-01-05 ENCOUNTER — Encounter (HOSPITAL_COMMUNITY): Payer: Self-pay | Admitting: *Deleted

## 2016-01-05 ENCOUNTER — Emergency Department (HOSPITAL_COMMUNITY): Payer: Medicare Other

## 2016-01-05 DIAGNOSIS — E669 Obesity, unspecified: Secondary | ICD-10-CM | POA: Diagnosis not present

## 2016-01-05 DIAGNOSIS — I2781 Cor pulmonale (chronic): Secondary | ICD-10-CM | POA: Diagnosis present

## 2016-01-05 DIAGNOSIS — Z7951 Long term (current) use of inhaled steroids: Secondary | ICD-10-CM

## 2016-01-05 DIAGNOSIS — I5043 Acute on chronic combined systolic (congestive) and diastolic (congestive) heart failure: Secondary | ICD-10-CM | POA: Diagnosis present

## 2016-01-05 DIAGNOSIS — E860 Dehydration: Secondary | ICD-10-CM | POA: Diagnosis present

## 2016-01-05 DIAGNOSIS — I480 Paroxysmal atrial fibrillation: Secondary | ICD-10-CM | POA: Diagnosis present

## 2016-01-05 DIAGNOSIS — I272 Other secondary pulmonary hypertension: Secondary | ICD-10-CM | POA: Diagnosis present

## 2016-01-05 DIAGNOSIS — N183 Chronic kidney disease, stage 3 unspecified: Secondary | ICD-10-CM | POA: Diagnosis present

## 2016-01-05 DIAGNOSIS — J449 Chronic obstructive pulmonary disease, unspecified: Secondary | ICD-10-CM | POA: Diagnosis not present

## 2016-01-05 DIAGNOSIS — R0600 Dyspnea, unspecified: Secondary | ICD-10-CM | POA: Diagnosis not present

## 2016-01-05 DIAGNOSIS — I130 Hypertensive heart and chronic kidney disease with heart failure and stage 1 through stage 4 chronic kidney disease, or unspecified chronic kidney disease: Secondary | ICD-10-CM | POA: Diagnosis present

## 2016-01-05 DIAGNOSIS — E890 Postprocedural hypothyroidism: Secondary | ICD-10-CM | POA: Diagnosis present

## 2016-01-05 DIAGNOSIS — Z9842 Cataract extraction status, left eye: Secondary | ICD-10-CM

## 2016-01-05 DIAGNOSIS — J189 Pneumonia, unspecified organism: Secondary | ICD-10-CM

## 2016-01-05 DIAGNOSIS — K5090 Crohn's disease, unspecified, without complications: Secondary | ICD-10-CM | POA: Diagnosis present

## 2016-01-05 DIAGNOSIS — K219 Gastro-esophageal reflux disease without esophagitis: Secondary | ICD-10-CM | POA: Diagnosis present

## 2016-01-05 DIAGNOSIS — I255 Ischemic cardiomyopathy: Secondary | ICD-10-CM | POA: Diagnosis not present

## 2016-01-05 DIAGNOSIS — Z7901 Long term (current) use of anticoagulants: Secondary | ICD-10-CM

## 2016-01-05 DIAGNOSIS — E876 Hypokalemia: Secondary | ICD-10-CM | POA: Diagnosis not present

## 2016-01-05 DIAGNOSIS — E875 Hyperkalemia: Secondary | ICD-10-CM | POA: Diagnosis present

## 2016-01-05 DIAGNOSIS — E1122 Type 2 diabetes mellitus with diabetic chronic kidney disease: Secondary | ICD-10-CM | POA: Diagnosis present

## 2016-01-05 DIAGNOSIS — R0602 Shortness of breath: Secondary | ICD-10-CM

## 2016-01-05 DIAGNOSIS — T502X5A Adverse effect of carbonic-anhydrase inhibitors, benzothiadiazides and other diuretics, initial encounter: Secondary | ICD-10-CM | POA: Diagnosis not present

## 2016-01-05 DIAGNOSIS — E1151 Type 2 diabetes mellitus with diabetic peripheral angiopathy without gangrene: Secondary | ICD-10-CM | POA: Diagnosis present

## 2016-01-05 DIAGNOSIS — Z79899 Other long term (current) drug therapy: Secondary | ICD-10-CM

## 2016-01-05 DIAGNOSIS — G4733 Obstructive sleep apnea (adult) (pediatric): Secondary | ICD-10-CM | POA: Diagnosis present

## 2016-01-05 DIAGNOSIS — J440 Chronic obstructive pulmonary disease with acute lower respiratory infection: Secondary | ICD-10-CM | POA: Diagnosis present

## 2016-01-05 DIAGNOSIS — I2510 Atherosclerotic heart disease of native coronary artery without angina pectoris: Secondary | ICD-10-CM | POA: Diagnosis present

## 2016-01-05 DIAGNOSIS — E1121 Type 2 diabetes mellitus with diabetic nephropathy: Secondary | ICD-10-CM

## 2016-01-05 DIAGNOSIS — Z9841 Cataract extraction status, right eye: Secondary | ICD-10-CM | POA: Diagnosis not present

## 2016-01-05 DIAGNOSIS — M109 Gout, unspecified: Secondary | ICD-10-CM | POA: Diagnosis present

## 2016-01-05 DIAGNOSIS — Z87891 Personal history of nicotine dependence: Secondary | ICD-10-CM | POA: Diagnosis not present

## 2016-01-05 DIAGNOSIS — I482 Chronic atrial fibrillation, unspecified: Secondary | ICD-10-CM

## 2016-01-05 DIAGNOSIS — I50810 Right heart failure, unspecified: Secondary | ICD-10-CM

## 2016-01-05 DIAGNOSIS — I428 Other cardiomyopathies: Secondary | ICD-10-CM | POA: Diagnosis present

## 2016-01-05 DIAGNOSIS — E785 Hyperlipidemia, unspecified: Secondary | ICD-10-CM | POA: Diagnosis present

## 2016-01-05 DIAGNOSIS — J9621 Acute and chronic respiratory failure with hypoxia: Secondary | ICD-10-CM | POA: Diagnosis present

## 2016-01-05 DIAGNOSIS — I5033 Acute on chronic diastolic (congestive) heart failure: Secondary | ICD-10-CM | POA: Diagnosis not present

## 2016-01-05 DIAGNOSIS — M810 Age-related osteoporosis without current pathological fracture: Secondary | ICD-10-CM | POA: Diagnosis present

## 2016-01-05 DIAGNOSIS — I4892 Unspecified atrial flutter: Secondary | ICD-10-CM | POA: Diagnosis present

## 2016-01-05 DIAGNOSIS — I25119 Atherosclerotic heart disease of native coronary artery with unspecified angina pectoris: Secondary | ICD-10-CM | POA: Diagnosis not present

## 2016-01-05 DIAGNOSIS — J441 Chronic obstructive pulmonary disease with (acute) exacerbation: Secondary | ICD-10-CM

## 2016-01-05 DIAGNOSIS — Z9981 Dependence on supplemental oxygen: Secondary | ICD-10-CM | POA: Diagnosis not present

## 2016-01-05 DIAGNOSIS — Z961 Presence of intraocular lens: Secondary | ICD-10-CM | POA: Diagnosis present

## 2016-01-05 DIAGNOSIS — N179 Acute kidney failure, unspecified: Secondary | ICD-10-CM | POA: Diagnosis present

## 2016-01-05 DIAGNOSIS — I4891 Unspecified atrial fibrillation: Secondary | ICD-10-CM | POA: Diagnosis present

## 2016-01-05 DIAGNOSIS — Z794 Long term (current) use of insulin: Secondary | ICD-10-CM | POA: Diagnosis not present

## 2016-01-05 DIAGNOSIS — Z950 Presence of cardiac pacemaker: Secondary | ICD-10-CM

## 2016-01-05 DIAGNOSIS — Z6841 Body Mass Index (BMI) 40.0 and over, adult: Secondary | ICD-10-CM | POA: Diagnosis not present

## 2016-01-05 DIAGNOSIS — I509 Heart failure, unspecified: Secondary | ICD-10-CM | POA: Diagnosis not present

## 2016-01-05 LAB — URINE MICROSCOPIC-ADD ON

## 2016-01-05 LAB — HEPATIC FUNCTION PANEL
ALT: 14 U/L (ref 14–54)
AST: 29 U/L (ref 15–41)
Albumin: 2.9 g/dL — ABNORMAL LOW (ref 3.5–5.0)
Alkaline Phosphatase: 113 U/L (ref 38–126)
BILIRUBIN INDIRECT: 0.7 mg/dL (ref 0.3–0.9)
Bilirubin, Direct: 0.4 mg/dL (ref 0.1–0.5)
TOTAL PROTEIN: 6.4 g/dL — AB (ref 6.5–8.1)
Total Bilirubin: 1.1 mg/dL (ref 0.3–1.2)

## 2016-01-05 LAB — DIFFERENTIAL
Basophils Absolute: 1 10*3/uL — ABNORMAL HIGH (ref 0.0–0.1)
Basophils Relative: 1 %
Eosinophils Absolute: 0 10*3/uL (ref 0.0–0.7)
Eosinophils Relative: 0 %
LYMPHS ABS: 11 10*3/uL — AB (ref 0.7–4.0)
Lymphocytes Relative: 11 %
MONO ABS: 13 10*3/uL — AB (ref 0.1–1.0)
Monocytes Relative: 13 %
NEUTROS PCT: 75 %
Neutro Abs: 75 10*3/uL — ABNORMAL HIGH (ref 1.7–7.7)

## 2016-01-05 LAB — URINALYSIS, ROUTINE W REFLEX MICROSCOPIC
GLUCOSE, UA: NEGATIVE mg/dL
KETONES UR: NEGATIVE mg/dL
Nitrite: NEGATIVE
PH: 5 (ref 5.0–8.0)
Protein, ur: >300 mg/dL — AB
Specific Gravity, Urine: 1.027 (ref 1.005–1.030)

## 2016-01-05 LAB — CBC
HEMATOCRIT: 51 % — AB (ref 36.0–46.0)
HEMOGLOBIN: 16 g/dL — AB (ref 12.0–15.0)
MCH: 31.4 pg (ref 26.0–34.0)
MCHC: 31.4 g/dL (ref 30.0–36.0)
MCV: 100 fL (ref 78.0–100.0)
Platelets: 339 10*3/uL (ref 150–400)
RBC: 5.1 MIL/uL (ref 3.87–5.11)
RDW: 15.9 % — AB (ref 11.5–15.5)
WBC: 7.9 10*3/uL (ref 4.0–10.5)

## 2016-01-05 LAB — I-STAT TROPONIN, ED: Troponin i, poc: 0.03 ng/mL (ref 0.00–0.08)

## 2016-01-05 LAB — LIPASE, BLOOD: LIPASE: 21 U/L (ref 11–51)

## 2016-01-05 LAB — I-STAT ARTERIAL BLOOD GAS, ED
ACID-BASE DEFICIT: 4 mmol/L — AB (ref 0.0–2.0)
Bicarbonate: 24.5 mEq/L — ABNORMAL HIGH (ref 20.0–24.0)
O2 SAT: 95 %
PCO2 ART: 54.4 mmHg — AB (ref 35.0–45.0)
PH ART: 7.262 — AB (ref 7.350–7.450)
PO2 ART: 86 mmHg (ref 80.0–100.0)
Patient temperature: 98.6
TCO2: 26 mmol/L (ref 0–100)

## 2016-01-05 LAB — BASIC METABOLIC PANEL
ANION GAP: 12 (ref 5–15)
BUN: 26 mg/dL — AB (ref 6–20)
CHLORIDE: 104 mmol/L (ref 101–111)
CO2: 24 mmol/L (ref 22–32)
Calcium: 9.5 mg/dL (ref 8.9–10.3)
Creatinine, Ser: 1.7 mg/dL — ABNORMAL HIGH (ref 0.44–1.00)
GFR, EST AFRICAN AMERICAN: 33 mL/min — AB (ref >60)
GFR, EST NON AFRICAN AMERICAN: 28 mL/min — AB (ref >60)
Glucose, Bld: 138 mg/dL — ABNORMAL HIGH (ref 65–99)
POTASSIUM: 5.4 mmol/L — AB (ref 3.5–5.1)
SODIUM: 140 mmol/L (ref 135–145)

## 2016-01-05 LAB — PROTIME-INR
INR: 1.53 — AB (ref 0.00–1.49)
PROTHROMBIN TIME: 18.4 s — AB (ref 11.6–15.2)

## 2016-01-05 LAB — INFLUENZA PANEL BY PCR (TYPE A & B)
H1N1FLUPCR: NOT DETECTED
Influenza A By PCR: NEGATIVE
Influenza B By PCR: NEGATIVE

## 2016-01-05 LAB — BRAIN NATRIURETIC PEPTIDE: B Natriuretic Peptide: 844.9 pg/mL — ABNORMAL HIGH (ref 0.0–100.0)

## 2016-01-05 LAB — GLUCOSE, CAPILLARY: GLUCOSE-CAPILLARY: 191 mg/dL — AB (ref 65–99)

## 2016-01-05 LAB — I-STAT CG4 LACTIC ACID, ED: Lactic Acid, Venous: 1.91 mmol/L (ref 0.5–2.0)

## 2016-01-05 LAB — TSH: TSH: 4.493 u[IU]/mL (ref 0.350–4.500)

## 2016-01-05 LAB — PHOSPHORUS: Phosphorus: 4.5 mg/dL (ref 2.5–4.6)

## 2016-01-05 MED ORDER — ALBUTEROL SULFATE HFA 108 (90 BASE) MCG/ACT IN AERS: 2.0000 | INHALATION_SPRAY | Freq: Four times a day (QID) | RESPIRATORY_TRACT | Status: DC | PRN

## 2016-01-05 MED ORDER — INSULIN GLARGINE 100 UNIT/ML ~~LOC~~ SOLN
10.0000 [IU] | SUBCUTANEOUS | Status: DC
  Administered 2016-01-06: 10 [IU] via SUBCUTANEOUS
  Filled 2016-01-05 (×2): qty 0.1

## 2016-01-05 MED ORDER — DEXTROSE 5 % IV SOLN
1.0000 g | Freq: Once | INTRAVENOUS | Status: AC
  Administered 2016-01-05: 1 g via INTRAVENOUS
  Filled 2016-01-05: qty 10

## 2016-01-05 MED ORDER — ATORVASTATIN CALCIUM 80 MG PO TABS
80.0000 mg | ORAL_TABLET | Freq: Every day | ORAL | Status: DC
  Administered 2016-01-05 – 2016-01-11 (×7): 80 mg via ORAL
  Filled 2016-01-05 (×7): qty 1

## 2016-01-05 MED ORDER — SULFASALAZINE 500 MG PO TABS
1000.0000 mg | ORAL_TABLET | Freq: Two times a day (BID) | ORAL | Status: DC
  Administered 2016-01-05 – 2016-01-12 (×14): 1000 mg via ORAL
  Filled 2016-01-05 (×16): qty 2

## 2016-01-05 MED ORDER — RANOLAZINE ER 500 MG PO TB12
500.0000 mg | ORAL_TABLET | Freq: Two times a day (BID) | ORAL | Status: DC
  Administered 2016-01-05 – 2016-01-12 (×14): 500 mg via ORAL
  Filled 2016-01-05 (×14): qty 1

## 2016-01-05 MED ORDER — ALBUTEROL SULFATE (2.5 MG/3ML) 0.083% IN NEBU: 2.5000 mg | INHALATION_SOLUTION | Freq: Four times a day (QID) | RESPIRATORY_TRACT | Status: DC | PRN

## 2016-01-05 MED ORDER — ADULT MULTIVITAMIN W/MINERALS CH
1.0000 | ORAL_TABLET | Freq: Every day | ORAL | Status: DC
  Administered 2016-01-06 – 2016-01-12 (×7): 1 via ORAL
  Filled 2016-01-05 (×7): qty 1

## 2016-01-05 MED ORDER — LOSARTAN POTASSIUM 50 MG PO TABS
50.0000 mg | ORAL_TABLET | Freq: Every day | ORAL | Status: DC
Start: 2016-01-05 — End: 2016-01-05

## 2016-01-05 MED ORDER — DEXTROSE 5 % IV SOLN
500.0000 mg | Freq: Once | INTRAVENOUS | Status: AC
  Administered 2016-01-05: 500 mg via INTRAVENOUS
  Filled 2016-01-05: qty 500

## 2016-01-05 MED ORDER — IPRATROPIUM-ALBUTEROL 0.5-2.5 (3) MG/3ML IN SOLN
3.0000 mL | Freq: Once | RESPIRATORY_TRACT | Status: AC
Start: 2016-01-05 — End: 2016-01-05
  Administered 2016-01-05: 3 mL via RESPIRATORY_TRACT
  Filled 2016-01-05: qty 3

## 2016-01-05 MED ORDER — APIXABAN 5 MG PO TABS
5.0000 mg | ORAL_TABLET | Freq: Two times a day (BID) | ORAL | Status: DC
Start: 2016-01-05 — End: 2016-01-12
  Administered 2016-01-05 – 2016-01-12 (×14): 5 mg via ORAL
  Filled 2016-01-05 (×14): qty 1

## 2016-01-05 MED ORDER — OMEPRAZOLE MAGNESIUM 20 MG PO TBEC
20.0000 mg | DELAYED_RELEASE_TABLET | Freq: Every day | ORAL | Status: DC
Start: 2016-01-05 — End: 2016-01-05

## 2016-01-05 MED ORDER — METHYLPREDNISOLONE SODIUM SUCC 125 MG IJ SOLR
125.0000 mg | Freq: Once | INTRAMUSCULAR | Status: AC
  Administered 2016-01-05: 125 mg via INTRAVENOUS
  Filled 2016-01-05: qty 2

## 2016-01-05 MED ORDER — FOLIC ACID 1 MG PO TABS
1.0000 mg | ORAL_TABLET | Freq: Every day | ORAL | Status: DC
  Administered 2016-01-06 – 2016-01-12 (×7): 1 mg via ORAL
  Filled 2016-01-05 (×7): qty 1

## 2016-01-05 MED ORDER — METOPROLOL TARTRATE 100 MG PO TABS
100.0000 mg | ORAL_TABLET | Freq: Two times a day (BID) | ORAL | Status: DC
  Administered 2016-01-05 – 2016-01-12 (×13): 100 mg via ORAL
  Filled 2016-01-05 (×14): qty 1

## 2016-01-05 MED ORDER — DIPHENHYDRAMINE-APAP (SLEEP) 25-500 MG PO TABS: 2.0000 | ORAL_TABLET | Freq: Every evening | ORAL | Status: DC | PRN

## 2016-01-05 MED ORDER — PANTOPRAZOLE SODIUM 40 MG PO TBEC
40.0000 mg | DELAYED_RELEASE_TABLET | Freq: Every day | ORAL | Status: DC
  Administered 2016-01-06 – 2016-01-12 (×7): 40 mg via ORAL
  Filled 2016-01-05 (×7): qty 1

## 2016-01-05 MED ORDER — TIOTROPIUM BROMIDE MONOHYDRATE 18 MCG IN CAPS
1.0000 | ORAL_CAPSULE | Freq: Every day | RESPIRATORY_TRACT | Status: DC
  Administered 2016-01-06 – 2016-01-10 (×5): 18 ug via RESPIRATORY_TRACT
  Filled 2016-01-05 (×2): qty 5

## 2016-01-05 MED ORDER — CETYLPYRIDINIUM CHLORIDE 0.05 % MT LIQD
7.0000 mL | Freq: Two times a day (BID) | OROMUCOSAL | Status: DC
  Administered 2016-01-06 – 2016-01-12 (×12): 7 mL via OROMUCOSAL

## 2016-01-05 MED ORDER — PREDNISONE 50 MG PO TABS
50.0000 mg | ORAL_TABLET | Freq: Every day | ORAL | Status: DC
  Administered 2016-01-06 – 2016-01-08 (×3): 50 mg via ORAL
  Filled 2016-01-05 (×3): qty 1

## 2016-01-05 MED ORDER — SODIUM CHLORIDE 0.9 % IV SOLN
INTRAVENOUS | Status: DC
  Administered 2016-01-05: 22:00:00 via INTRAVENOUS

## 2016-01-05 MED ORDER — AZITHROMYCIN 500 MG PO TABS
500.0000 mg | ORAL_TABLET | ORAL | Status: DC
  Administered 2016-01-06 – 2016-01-07 (×2): 500 mg via ORAL
  Filled 2016-01-05 (×2): qty 1

## 2016-01-05 MED ORDER — LEVOTHYROXINE SODIUM 75 MCG PO TABS
75.0000 ug | ORAL_TABLET | Freq: Every day | ORAL | Status: DC
  Administered 2016-01-06 – 2016-01-12 (×7): 75 ug via ORAL
  Filled 2016-01-05 (×7): qty 1

## 2016-01-05 MED ORDER — COLCHICINE 0.6 MG PO TABS: 0.6000 mg | ORAL_TABLET | Freq: Every day | ORAL | Status: DC | PRN

## 2016-01-05 MED ORDER — MOMETASONE FURO-FORMOTEROL FUM 200-5 MCG/ACT IN AERO
2.0000 | INHALATION_SPRAY | Freq: Two times a day (BID) | RESPIRATORY_TRACT | Status: DC
  Administered 2016-01-05 – 2016-01-12 (×14): 2 via RESPIRATORY_TRACT
  Filled 2016-01-05: qty 8.8

## 2016-01-05 MED ORDER — DEXTROSE 5 % IV SOLN
1.0000 g | INTRAVENOUS | Status: DC
  Administered 2016-01-06 – 2016-01-07 (×2): 1 g via INTRAVENOUS
  Filled 2016-01-05 (×3): qty 10

## 2016-01-05 NOTE — ED Provider Notes (Signed)
CSN: [id number]     Arrival date & time 01/05/16  1601 History   First MD Initiated Contact with Patient 01/05/16 1615     Chief Complaint  Patient presents with  . Shortness of Breath  . Weakness     (Consider location/radiation/quality/duration/timing/severity/associated sxs/prior Treatment) HPI Symptoms onset 7 days ago. Patient with increasing fatigue and generalized weakness. Son reports that for the past 2 days patient has had very little to eat and drink. A reports she has not been putting much urine out and thus discontinued her Lasix 4 days ago. She reports her urine has been dark. Patient has COPD. Family members have noted increasing shortness of breath as well as some cough over the past 2 days.. Patient denies she has had any chest pain. She denies vomiting diarrhea or abdominal pain. He denies swelling of the lower extremities. Patient thought maybe her symptoms were due to low calcium because she had a parathyroidectomy approximately 4 months ago and then subsequently had 2 hospitalizations for that. She reports however she saw her physician 2 days ago and they drew labs and there were no problems with the potassium. At this time, with increasing weakness and illness, the patient has been referred to the emergency department by her PCP. Patient wears nighttime oxygen for COPD but not during the day. Past Medical History  Diagnosis Date  . Nonischemic cardiomyopathy ([place])     history of,  EF 20-25% at left heart cath in 06/2007; echo 2011 had normal LV function  . Chronic diastolic heart failure ([place])     Echo 06/2010: Moderate LVH, EF 55-65%, normal wall motion, mild MR, moderate to severe LAE, mild RAE.   . Morbid obesity ([place])   . Hyperlipidemia   . Tachy-brady syndrome ([place])     status post implant of a medtronic dual-chamber pacemaker in 2001.  explanted in 2010 -- [place] pacemaker in 2010.  Marland Kitchen Chronic obstructive pulmonary disease ([place])   .  Osteoporosis   . Depression   . GERD (gastroesophageal reflux disease)   . Crohn's disease ([place])   . Hypothyroidism     treated  . Seasonal allergies   . Pacemaker     Medtronic  . Atrial fibrillation ([place])     Status post pulmonary vein isolation 2009 at [facility]  . CAD (coronary artery disease)     LHC 05/2007: pLAD 70-80%, pRCA 40%, EF 20-25%. LAD lesion treated medically.   . Coronary artery disease 06/15/2012  . Atrial fibrillation ([place]) 12/18/2007    Annotation: refractory Qualifier: Diagnosis of  By: Doy Mince LPN, Megan    . Morbid obesity with BMI of 40.0-44.9, adult ([place]) 06/22/2012  . Diabetes mellitus ([place]) 06/22/2012  . Diabetes mellitus without complication ([place])   . COPD (chronic obstructive pulmonary disease) ([place])   . Gout   . Dysrhythmia     atrial fib  . Peripheral vascular disease ([facility]) 15    rt arm clots  . Obstructive sleep apnea     continuous positive airway pressure not using at present  . Shortness of breath dyspnea     exersion  . CHF (congestive heart failure) ([place])   . On home oxygen therapy     "2L at night" (10/03/2015)   Past Surgical History  Procedure Laterality Date  . Pulmonary vein isolation  05/12/2008    RFCA atrial fibrillation  . Post ablation pseudoaneurysm      at A fib ablation  . Pacemaker insertion  2010    medtronic ADAPTA   . Tonsillectomy    . Embolectomy Right 08/16/2014    Procedure: EMBOLECTOMY- RIGHT BRACHIAL ARTERY;  Surgeon: Serafina Mitchell, MD;  Location: [facility] OR;  Service: Vascular;  Laterality: Right;  . Cardiac catheterization  08/2000    noncritical disease mid RCA, EF preserved  . Cardiac catheterization  05/29/2007    noncritical disease, EF 20-25%  . Cataract extraction w/ intraocular lens  implant, bilateral Bilateral 2014-2016  . Parathyroidectomy Right 01/30/2015    Procedure: PARATHYROIDECTOMY;  Surgeon: Armandina Gemma, MD;  Location: [place];  Service: General;  Laterality: Right;  . Parathyroidectomy Left 10/03/2015     Left inferior parathyroidectomy w/neck exploration  . Parathyroidectomy Left 10/03/2015    Procedure: LEFT PARATHYROID EXPLORATION AND  PARATHYROIDECTOMY;  Surgeon: Armandina Gemma, MD;  Location: [facility] OR;  Service: General;  Laterality: Left;   Family History  Problem Relation Age of Onset  . Breast cancer Mother   . Heart disease Father   . Emphysema Father    Social History  Substance Use Topics  . Smoking status: Former Smoker -- 3.00 packs/day for 32 years    Quit date: 09/30/1986  . Smokeless tobacco: Former Systems developer    Quit date: 08/15/1987  . Alcohol Use: No   OB History    Gravida Para Term Preterm AB TAB SAB Ectopic Multiple Living   0 0 0 0 0 0 0 0       Review of Systems  10 Systems reviewed and are negative for acute change except as noted in the HPI.  Allergies  Review of patient's allergies indicates no known allergies.  Home Medications   Prior to Admission medications   Medication Sig Start Date End Date Taking? Authorizing Provider  albuterol (PROVENTIL HFA;VENTOLIN HFA) 108 (90 BASE) MCG/ACT inhaler Inhale 2 puffs into the lungs every 6 (six) hours as needed for wheezing or shortness of breath.   Yes Historical Provider, MD  atorvastatin (LIPITOR) 80 MG tablet Take 80 mg by mouth at bedtime.     Yes Historical Provider, MD  colchicine 0.6 MG tablet Take 1-2 tablets by mouth daily as needed (gout). TAKE 2 TABLETS BY MOUTH AS ONE DOSE, THEN 1 TABLET IN 1 HOUR FOR ACUTE GOUT EPISODE 08/09/15  Yes Historical Provider, MD  diphenhydramine-acetaminophen (TYLENOL PM) 25-500 MG TABS tablet Take 2 tablets by mouth at bedtime as needed (sleep/pain).   Yes Historical Provider, MD  ELIQUIS 5 MG TABS tablet TAKE 1 TABLET BY MOUTH TWICE A DAY 08/21/15  Yes Deboraha Sprang, MD  Fiber CAPS Take 1 tablet by mouth at bedtime.    Yes Historical Provider, MD  Fluticasone-Salmeterol (ADVAIR) 250-50 MCG/DOSE AEPB Inhale 1 puff into the lungs every 12 (twelve) hours. 01/17/15  Yes Kathee Delton, MD  folic acid (FOLVITE) 1 MG tablet Take 1 mg by mouth daily. 06/01/15  Yes Historical Provider, MD  furosemide (LASIX) 40 MG tablet TAKE 80 MG BY MOUTH TWICE A DAY 10/13/15  Yes Nishant Dhungel, MD  insulin glargine (LANTUS) 100 UNIT/ML injection Inject 0.12 mLs (12 Units total) into the skin every morning. Patient taking differently: Inject 10 Units into the skin every morning.  11/01/15  Yes Modena Jansky, MD  KLOR-CON M20 20 MEQ tablet TAKE 1 TABLET BY MOUTH TWICE DAILY 04/13/15  Yes Deboraha Sprang, MD  levothyroxine (SYNTHROID, LEVOTHROID) 75 MCG tablet Take 75 mcg by mouth daily. 01/20/15  Yes Historical Provider, MD  loperamide (IMODIUM  A-D) 2 MG tablet Take 2 mg by mouth 4 (four) times daily as needed for diarrhea or loose stools.   Yes Historical Provider, MD  losartan (COZAAR) 50 MG tablet Take 50 mg by mouth at bedtime.  01/16/15  Yes Historical Provider, MD  metoprolol (LOPRESSOR) 100 MG tablet TAKE 1 TABLET BY MOUTH TWICE A DAY 12/28/15  Yes Deboraha Sprang, MD  Multiple Vitamins-Minerals (MULTIVITAMIN WITH MINERALS) tablet Take 1 tablet by mouth daily.   Yes Historical Provider, MD  nitroGLYCERIN (NITROSTAT) 0.4 MG SL tablet Place 1 tablet (0.4 mg total) under the tongue every 5 (five) minutes as needed for chest pain. 07/07/14 01/05/16 Yes Deboraha Sprang, MD  omeprazole (PRILOSEC OTC) 20 MG tablet Take 20 mg by mouth daily.     Yes Historical Provider, MD  RANEXA 500 MG 12 hr tablet TAKE 1 TABLET BY MOUTH TWICE DAILY 12/26/15  Yes Deboraha Sprang, MD  sulfaSALAzine (AZULFIDINE) 500 MG tablet Take 1,000 mg by mouth 2 (two) times daily. 03/16/15  Yes Historical Provider, MD  Tiotropium Bromide Monohydrate (SPIRIVA RESPIMAT) 2.5 MCG/ACT AERS Inhale 2 puffs into the lungs daily. 03/16/15  Yes Collene Gobble, MD  Vitamin D, Ergocalciferol, (DRISDOL) 50000 units CAPS capsule Take 50,000 Units by mouth every Sunday. 07/28/15  Yes Historical Provider, MD  calcium carbonate (OS-CAL - DOSED IN MG OF  ELEMENTAL CALCIUM) 1250 (500 Ca) MG tablet Take 4 tablets (2,000 mg of elemental calcium total) by mouth 3 (three) times daily with meals. 11/01/15   Modena Jansky, MD  insulin aspart (NOVOLOG) 100 UNIT/ML injection Inject 0-9 Units into the skin 3 (three) times daily with meals. CBG < 70: implement hypoglycemia protocol CBG 70 - 120: 0 units CBG 121 - 150: 1 unit CBG 151 - 200: 2 units CBG 201 - 250: 3 units CBG 251 - 300: 5 units CBG 301 - 350: 7 units CBG 351 - 400: 9 units CBG > 400: call MD. Patient not taking: Reported on 01/05/2016 11/01/15   Modena Jansky, MD  ipratropium-albuterol (DUONEB) 0.5-2.5 (3) MG/3ML SOLN Take 3 mLs by nebulization every 6 (six) hours as needed (wheezing and or shortness of breath). 02/18/15   Nishant Dhungel, MD   BP 129/93 mmHg  Pulse 125  Temp(Src) 98.7 F (37.1 C) (Rectal)  Resp 25  Ht 5\' 11"  (1.803 m)  Wt 255 lb (115.667 kg)  BMI 35.58 kg/m2  SpO2 93% Physical Exam  Constitutional: She is oriented to person, place, and time.  Patient is moderately obese and deconditioned. She is fatigued in appearance. She has mild increased work of breathing.  HENT:  Head: Normocephalic and atraumatic.  Right Ear: External ear normal.  Left Ear: External ear normal.  Nose: Nose normal.  Geographic tongue, pink and moist.  Eyes: EOM are normal. Pupils are equal, round, and reactive to light.  Neck: Neck supple.  Cardiovascular:  Irregularly irregular. 2/6 systolic ejection murmur.  Pulmonary/Chest:  Mild increased work of breathing. Expiratory wheezes at bases.  Abdominal: Soft. Bowel sounds are normal. She exhibits no distension. There is no tenderness. There is no rebound.  Candida rash beneath breast folds and intertriginous groin folds, moderate in severity.  Musculoskeletal: Normal range of motion. She exhibits no edema or tenderness.  Both the patient's feet are slightly cool. Dorsalis pedis pulses are one plus. No wounds or areas of active  cellulitis.  Neurological: She is alert and oriented to person, place, and time. No cranial nerve deficit. She  exhibits normal muscle tone. Coordination normal.  Skin: Skin is warm and dry.  Candida beneath the breasts and in intertriginous groin folds.  Psychiatric: She has a normal mood and affect.    ED Course  Procedures (including critical care time) Labs Review Labs Reviewed  BASIC METABOLIC PANEL - Abnormal; Notable for the following:    Potassium 5.4 (*)    Glucose, Bld 138 (*)    BUN 26 (*)    Creatinine, Ser 1.70 (*)    GFR calc non Af Amer 28 (*)    GFR calc Af Amer 33 (*)    All other components within normal limits  CBC - Abnormal; Notable for the following:    Hemoglobin 16.0 (*)    HCT 51.0 (*)    RDW 15.9 (*)    All other components within normal limits  HEPATIC FUNCTION PANEL - Abnormal; Notable for the following:    Total Protein 6.4 (*)    Albumin 2.9 (*)    All other components within normal limits  PROTIME-INR - Abnormal; Notable for the following:    Prothrombin Time 18.4 (*)    INR 1.53 (*)    All other components within normal limits  URINALYSIS, ROUTINE W REFLEX MICROSCOPIC (NOT AT [facility]) - Abnormal; Notable for the following:    Color, Urine AMBER (*)    APPearance CLOUDY (*)    Hgb urine dipstick MODERATE (*)    Bilirubin Urine SMALL (*)    Protein, ur >300 (*)    Leukocytes, UA TRACE (*)    All other components within normal limits  DIFFERENTIAL - Abnormal; Notable for the following:    Neutro Abs 75.0 (*)    Lymphs Abs 11.0 (*)    Monocytes Absolute 13.0 (*)    Basophils Absolute 1.0 (*)    All other components within normal limits  URINE MICROSCOPIC-ADD ON - Abnormal; Notable for the following:    Squamous Epithelial / LPF 0-5 (*)    Bacteria, UA RARE (*)    Casts GRANULAR CAST (*)    All other components within normal limits  I-STAT ARTERIAL BLOOD GAS, ED - Abnormal; Notable for the following:    pH, Arterial 7.262 (*)    pCO2  arterial 54.4 (*)    Bicarbonate 24.5 (*)    Acid-base deficit 4.0 (*)    All other components within normal limits  CULTURE, BLOOD (ROUTINE X 2)  CULTURE, BLOOD (ROUTINE X 2)  LIPASE, BLOOD  PHOSPHORUS  TSH  BRAIN NATRIURETIC PEPTIDE  BLOOD GAS, ARTERIAL  CALCIUM, IONIZED  T3, FREE  INFLUENZA PANEL BY PCR (TYPE A & B, H1N1)  I-STAT TROPOININ, ED  I-STAT CG4 LACTIC ACID, ED    Imaging Review Dg Chest Port 1 View  01/05/2016  CLINICAL DATA:  Respiratory distress. EXAM: PORTABLE CHEST 1 VIEW COMPARISON:  11/01/2015 FINDINGS: Stable appearance of pacemaker. The heart is mildly enlarged and stable in appearance. Bilateral lower lung densities present, left greater than right, consistent with atelectasis versus infiltrates. No overt pulmonary edema. IMPRESSION: Stable cardiomegaly. Bilateral lower lung atelectasis versus infiltrates, left greater than right. Electronically Signed   By: Aletta Edouard M.D.   On: 01/05/2016 19:37   I have personally reviewed and evaluated these images and lab results as part of my medical decision-making.   EKG Interpretation   Date/Time:  Friday January 05 2016 16:05:20 EDT Ventricular Rate:  98 PR Interval:    QRS Duration: 148 QT Interval:  421 QTC Calculation: 538  R Axis:   140 Text Interpretation:  Atrial flutter Paired ventricular premature  complexes Right bundle branch block Abnormal lateral Q waves agree. no  acute changes c/w old Confirmed by Johnney Killian, MD, Jeannie Done [phone]) on 01/05/2016  5:15:57 PM      MDM   Final diagnoses:  Community acquired pneumonia  Chronic obstructive pulmonary disease, unspecified COPD type ([place])  Chronic atrial fibrillation ([place])   Patient presents with constitutional symptoms developing over approximately a week. She has recently noted increased cough and shortness of breath as well. He has chronic atrial fibrillation. At this time, there are infiltrates identified on the chest x-ray. With a combination of  historical COPD, constitutional illness developing and  Infiltrates vs atelectasis (per rad review) I have opted to treat the patient for community-acquired pneumonia. She does not endorse chest pain., Mental status is clear. Patient does have mild hypercapnia but does not have any signs of encephalopathy, she shows no signs of drowsiness or confusion.    Charlesetta Shanks, MD 01/05/16 2115

## 2016-01-05 NOTE — Progress Notes (Signed)
ABG obtained on 3L nasal cannula.  Results given to MD.  Will continue to monitor.    Ref. Range 01/05/2016 17:24  Sample type Unknown ARTERIAL  pH, Arterial Latest Ref Range: 7.350-7.450  7.262 (L)  pCO2 arterial Latest Ref Range: 35.0-45.0 mmHg 54.4 (H)  pO2, Arterial Latest Ref Range: 80.0-100.0 mmHg 86.0  Bicarbonate Latest Ref Range: 20.0-24.0 mEq/L 24.5 (H)  TCO2 Latest Ref Range: 0-100 mmol/L 26  Acid-base deficit Latest Ref Range: 0.0-2.0 mmol/L 4.0 (H)  O2 Saturation Latest Units: % 95.0  Patient temperature Unknown 98.6 F  Collection site Unknown RADIAL, [facility]EN[facility]S T.Marland KitchenMarland Kitchen

## 2016-01-05 NOTE — ED Notes (Signed)
Pt presents via [facility] from home c/o increased SOB with exertion and generalized weakness x 7 days.  Pt reports seeing PCP Wednesday, blood work completed.  Pt also reports she stopped her Lasik (x 4 days) d/t decreased input.   Lungs clear, no peripheral edema noted.  Denies pain, EKG unremarkable.  EMS reports intial sats 92% RA, 3L applied 98%.  BP-120/84, P-84 irreg, Hx of Afib.  R-18, O2-98% 3L.  Pt wears O2 at night.

## 2016-01-05 NOTE — H&P (Signed)
[facility] History and Physical  STEFHANIE AYRES [id number] DOB: 1940-10-15 DOA: 01/05/2016  Referring physician: EDP PCP: [name], MD   Chief Complaint: SOB, weakness   HPI: [name] is a 75 y.o. female with h/o COPD on 2L home O2 at night, DM2.  Patient presents to the ED with c/o increasing fatigue, generalized weakness, SOB, cough.  She discontinued lasix 4 days ago due to poor UOP, no N/V/D.  No peripheral edema, no orthopnea.  Parathyroidectomy 4 months ago, had subsequent calcium problems but calcium was normal 2 days ago.  Review of Systems: Systems reviewed.  As above, otherwise negative  Past Medical History  Diagnosis Date  . Nonischemic cardiomyopathy ([place])     history of,  EF 20-25% at left heart cath in 06/2007; echo 2011 had normal LV function  . Chronic diastolic heart failure ([place])     Echo 06/2010: Moderate LVH, EF 55-65%, normal wall motion, mild MR, moderate to severe LAE, mild RAE.   . Morbid obesity ([place])   . Hyperlipidemia   . Tachy-brady syndrome ([place])     status post implant of a medtronic dual-chamber pacemaker in 2001.  explanted in 2010 -- [place] pacemaker in 2010.  Marland Kitchen Chronic obstructive pulmonary disease ([place])   . Osteoporosis   . Depression   . GERD (gastroesophageal reflux disease)   . Crohn's disease ([place])   . Hypothyroidism     treated  . Seasonal allergies   . Pacemaker     Medtronic  . Atrial fibrillation ([place])     Status post pulmonary vein isolation 2009 at [facility]  . CAD (coronary artery disease)     LHC 05/2007: pLAD 70-80%, pRCA 40%, EF 20-25%. LAD lesion treated medically.   . Coronary artery disease 06/15/2012  . Atrial fibrillation ([place]) 12/18/2007    Annotation: refractory Qualifier: Diagnosis of  By: Doy Mince LPN, Megan    . Morbid obesity with BMI of 40.0-44.9, adult ([place]) 06/22/2012  . Diabetes mellitus ([place]) 06/22/2012  . Diabetes mellitus without complication ([place])   . COPD  (chronic obstructive pulmonary disease) ([place])   . Gout   . Dysrhythmia     atrial fib  . Peripheral vascular disease (HCC) 15    rt arm clots  . Obstructive sleep apnea     continuous positive airway pressure not using at present  . Shortness of breath dyspnea     exersion  . CHF (congestive heart failure) ([place])   . On home oxygen therapy     "2L at night" (10/03/2015)   Past Surgical History  Procedure Laterality Date  . Pulmonary vein isolation  05/12/2008    RFCA atrial fibrillation  . Post ablation pseudoaneurysm      at A fib ablation  . Pacemaker insertion  2010    medtronic ADAPTA   . Tonsillectomy    . Embolectomy Right 08/16/2014    Procedure: EMBOLECTOMY- RIGHT BRACHIAL ARTERY;  Surgeon: Serafina Mitchell, MD;  Location: [facility] OR;  Service: Vascular;  Laterality: Right;  . Cardiac catheterization  08/2000    noncritical disease mid RCA, EF preserved  . Cardiac catheterization  05/29/2007    noncritical disease, EF 20-25%  . Cataract extraction w/ intraocular lens  implant, bilateral Bilateral 2014-2016  . Parathyroidectomy Right 01/30/2015    Procedure: PARATHYROIDECTOMY;  Surgeon: Armandina Gemma, MD;  Location: [place];  Service: General;  Laterality: Right;  . Parathyroidectomy Left 10/03/2015    Left inferior parathyroidectomy w/neck exploration  .  Parathyroidectomy Left 10/03/2015    Procedure: LEFT PARATHYROID EXPLORATION AND  PARATHYROIDECTOMY;  Surgeon: Armandina Gemma, MD;  Location: [place];  Service: General;  Laterality: Left;   Social History:  reports that she quit smoking about 29 years ago. She quit smokeless tobacco use about 28 years ago. She reports that she does not drink alcohol or use illicit drugs.  No Known Allergies  Family History  Problem Relation Age of Onset  . Breast cancer Mother   . Heart disease Father   . Emphysema Father      Prior to Admission medications   Medication Sig Start Date End Date Taking? Authorizing Provider  albuterol (PROVENTIL  HFA;VENTOLIN HFA) 108 (90 BASE) MCG/ACT inhaler Inhale 2 puffs into the lungs every 6 (six) hours as needed for wheezing or shortness of breath.   Yes Historical Provider, MD  atorvastatin (LIPITOR) 80 MG tablet Take 80 mg by mouth at bedtime.     Yes Historical Provider, MD  colchicine 0.6 MG tablet Take 1-2 tablets by mouth daily as needed (gout). TAKE 2 TABLETS BY MOUTH AS ONE DOSE, THEN 1 TABLET IN 1 HOUR FOR ACUTE GOUT EPISODE 08/09/15  Yes Historical Provider, MD  diphenhydramine-acetaminophen (TYLENOL PM) 25-500 MG TABS tablet Take 2 tablets by mouth at bedtime as needed (sleep/pain).   Yes Historical Provider, MD  ELIQUIS 5 MG TABS tablet TAKE 1 TABLET BY MOUTH TWICE A DAY 08/21/15  Yes Deboraha Sprang, MD  Fiber CAPS Take 1 tablet by mouth at bedtime.    Yes Historical Provider, MD  Fluticasone-Salmeterol (ADVAIR) 250-50 MCG/DOSE AEPB Inhale 1 puff into the lungs every 12 (twelve) hours. 01/17/15  Yes Kathee Delton, MD  folic acid (FOLVITE) 1 MG tablet Take 1 mg by mouth daily. 06/01/15  Yes Historical Provider, MD  furosemide (LASIX) 40 MG tablet TAKE 80 MG BY MOUTH TWICE A DAY 10/13/15  Yes Nishant Dhungel, MD  insulin glargine (LANTUS) 100 UNIT/ML injection Inject 0.12 mLs (12 Units total) into the skin every morning. Patient taking differently: Inject 10 Units into the skin every morning.  11/01/15  Yes Modena Jansky, MD  KLOR-CON M20 20 MEQ tablet TAKE 1 TABLET BY MOUTH TWICE DAILY 04/13/15  Yes Deboraha Sprang, MD  levothyroxine (SYNTHROID, LEVOTHROID) 75 MCG tablet Take 75 mcg by mouth daily. 01/20/15  Yes Historical Provider, MD  loperamide (IMODIUM A-D) 2 MG tablet Take 2 mg by mouth 4 (four) times daily as needed for diarrhea or loose stools.   Yes Historical Provider, MD  losartan (COZAAR) 50 MG tablet Take 50 mg by mouth at bedtime.  01/16/15  Yes Historical Provider, MD  metoprolol (LOPRESSOR) 100 MG tablet TAKE 1 TABLET BY MOUTH TWICE A DAY 12/28/15  Yes Deboraha Sprang, MD  Multiple  Vitamins-Minerals (MULTIVITAMIN WITH MINERALS) tablet Take 1 tablet by mouth daily.   Yes Historical Provider, MD  nitroGLYCERIN (NITROSTAT) 0.4 MG SL tablet Place 1 tablet (0.4 mg total) under the tongue every 5 (five) minutes as needed for chest pain. 07/07/14 01/05/16 Yes Deboraha Sprang, MD  omeprazole (PRILOSEC OTC) 20 MG tablet Take 20 mg by mouth daily.     Yes Historical Provider, MD  RANEXA 500 MG 12 hr tablet TAKE 1 TABLET BY MOUTH TWICE DAILY 12/26/15  Yes Deboraha Sprang, MD  sulfaSALAzine (AZULFIDINE) 500 MG tablet Take 1,000 mg by mouth 2 (two) times daily. 03/16/15  Yes Historical Provider, MD  Tiotropium Bromide Monohydrate (SPIRIVA RESPIMAT) 2.5 MCG/ACT AERS  Inhale 2 puffs into the lungs daily. 03/16/15  Yes Collene Gobble, MD  Vitamin D, Ergocalciferol, (DRISDOL) 50000 units CAPS capsule Take 50,000 Units by mouth every Sunday. 07/28/15  Yes Historical Provider, MD   Physical Exam: Filed Vitals:   01/05/16 2045 01/05/16 2100  BP: 133/92 129/93  Pulse: 65 125  Temp:    Resp: 25 25    BP 129/93 mmHg  Pulse 125  Temp(Src) 98.7 F (37.1 C) (Rectal)  Resp 25  Ht 5\' 11"  (1.803 m)  Wt 115.667 kg (255 lb)  BMI 35.58 kg/m2  SpO2 93%  General Appearance:    Alert, oriented, no distress, appears stated age  Head:    Normocephalic, atraumatic  Eyes:    PERRL, EOMI, sclera non-icteric        Nose:   Nares without drainage or epistaxis. Mucosa, turbinates normal  Throat:   Moist mucous membranes. Oropharynx without erythema or exudate.  Neck:   Supple. No carotid bruits.  No thyromegaly.  No lymphadenopathy.   Back:     No CVA tenderness, no spinal tenderness  Lungs:     Clear to auscultation bilaterally, without wheezes, rhonchi or rales  Chest wall:    No tenderness to palpitation  Heart:    Regular rate and rhythm without murmurs, gallops, rubs  Abdomen:     Soft, non-tender, nondistended, normal bowel sounds, no organomegaly  Genitalia:    deferred  Rectal:    deferred   Extremities:   No clubbing, cyanosis or edema.  Pulses:   2+ and symmetric all extremities  Skin:   Skin color, texture, turgor normal, no rashes or lesions  Lymph nodes:   Cervical, supraclavicular, and axillary nodes normal  Neurologic:   CNII-XII intact. Normal strength, sensation and reflexes      throughout    Labs on Admission:  Basic Metabolic Panel:  Recent Labs Lab 01/05/16 1623 01/05/16 1835  NA 140  --   K 5.4*  --   CL 104  --   CO2 24  --   GLUCOSE 138*  --   BUN 26*  --   CREATININE 1.70*  --   CALCIUM 9.5  --   PHOS  --  4.5   Liver Function Tests:  Recent Labs Lab 01/05/16 1835  AST 29  ALT 14  ALKPHOS 113  BILITOT 1.1  PROT 6.4*  ALBUMIN 2.9*    Recent Labs Lab 01/05/16 1835  LIPASE 21   No results for input(s): AMMONIA in the last 168 hours. CBC:  Recent Labs Lab 01/05/16 1623 01/05/16 1835  WBC 7.9  --   NEUTROABS  --  75.0*  HGB 16.0*  --   HCT 51.0*  --   MCV 100.0  --   PLT 339  --    Cardiac Enzymes: No results for input(s): CKTOTAL, CKMB, CKMBINDEX, TROPONINI in the last 168 hours.  BNP (last 3 results) No results for input(s): PROBNP in the last 8760 hours. CBG: No results for input(s): GLUCAP in the last 168 hours.  Radiological Exams on Admission: Dg Chest Port 1 View  01/05/2016  CLINICAL DATA:  Respiratory distress. EXAM: PORTABLE CHEST 1 VIEW COMPARISON:  11/01/2015 FINDINGS: Stable appearance of pacemaker. The heart is mildly enlarged and stable in appearance. Bilateral lower lung densities present, left greater than right, consistent with atelectasis versus infiltrates. No overt pulmonary edema. IMPRESSION: Stable cardiomegaly. Bilateral lower lung atelectasis versus infiltrates, left greater than right. Electronically Signed   By:  Aletta Edouard M.D.   On: 01/05/2016 19:37    EKG: Independently reviewed.  Assessment/Plan Principal Problem:   CAP (community acquired pneumonia) Active Problems:   Atrial  fibrillation (HCC)   CKD (chronic kidney disease) stage 3, GFR 30-59 ml/min   Type 2 diabetes mellitus with diabetic nephropathy (HCC)   AKI (acute kidney injury) ([place])   COPD (chronic obstructive pulmonary disease) ([place])   1. COPD exacerbation - 1. Adult wheeze protocol 2. Prednisone 2. CAP - possible CAP on CXR 1. Will treat at this point due to symptoms, though no fever and no leukocytosis 2. PNA pathway 3. azithro and rocephin 4. Cultures pending 3. AKI on CKD stage 3 - 1. Holding lasix 2. Will also hold ARB 3. Gentle hydration 4. Watch for fluid overload with h/o CHF 5. Recheck BMP in AM 4. DM2 - continue home lantus 5. A.fib - continue rate control meds, continue eliquis    Code Status: Full  Family Communication: Family at bedside Disposition Plan: Admit to inpatient   Time spent: 35 min  GARDNER, JARED M. [facility] Pager [phone]  If 7AM-7PM, please contact the day team taking care of the patient [email or web address] Password [email or web address] 01/05/2016, 9:30 PM

## 2016-01-05 NOTE — Progress Notes (Signed)
Called ER RN for report. RN not ready.

## 2016-01-06 LAB — GLUCOSE, CAPILLARY
GLUCOSE-CAPILLARY: 141 mg/dL — AB (ref 65–99)
GLUCOSE-CAPILLARY: 181 mg/dL — AB (ref 65–99)
Glucose-Capillary: 216 mg/dL — ABNORMAL HIGH (ref 65–99)

## 2016-01-06 LAB — BASIC METABOLIC PANEL
Anion gap: 12 (ref 5–15)
BUN: 27 mg/dL — AB (ref 6–20)
CO2: 22 mmol/L (ref 22–32)
Calcium: 9 mg/dL (ref 8.9–10.3)
Chloride: 103 mmol/L (ref 101–111)
Creatinine, Ser: 1.65 mg/dL — ABNORMAL HIGH (ref 0.44–1.00)
GFR calc Af Amer: 34 mL/min — ABNORMAL LOW (ref >60)
GFR, EST NON AFRICAN AMERICAN: 29 mL/min — AB (ref >60)
Glucose, Bld: 153 mg/dL — ABNORMAL HIGH (ref 65–99)
POTASSIUM: 5.6 mmol/L — AB (ref 3.5–5.1)
SODIUM: 137 mmol/L (ref 135–145)

## 2016-01-06 LAB — CBC
HCT: 49.5 % — ABNORMAL HIGH (ref 36.0–46.0)
Hemoglobin: 15.3 g/dL — ABNORMAL HIGH (ref 12.0–15.0)
MCH: 30.9 pg (ref 26.0–34.0)
MCHC: 30.9 g/dL (ref 30.0–36.0)
MCV: 100 fL (ref 78.0–100.0)
PLATELETS: 309 10*3/uL (ref 150–400)
RBC: 4.95 MIL/uL (ref 3.87–5.11)
RDW: 15.9 % — ABNORMAL HIGH (ref 11.5–15.5)
WBC: 5.2 10*3/uL (ref 4.0–10.5)

## 2016-01-06 LAB — T3, FREE: T3, Free: 1.7 pg/mL — ABNORMAL LOW (ref 2.0–4.4)

## 2016-01-06 LAB — HIV ANTIBODY (ROUTINE TESTING W REFLEX): HIV SCREEN 4TH GENERATION: NONREACTIVE

## 2016-01-06 MED ORDER — INSULIN GLARGINE 100 UNIT/ML ~~LOC~~ SOLN
10.0000 [IU] | Freq: Every day | SUBCUTANEOUS | Status: DC
  Administered 2016-01-07 – 2016-01-08 (×2): 10 [IU] via SUBCUTANEOUS
  Filled 2016-01-06 (×2): qty 0.1

## 2016-01-06 MED ORDER — INSULIN ASPART 100 UNIT/ML ~~LOC~~ SOLN
0.0000 [IU] | Freq: Three times a day (TID) | SUBCUTANEOUS | Status: DC
  Administered 2016-01-06: 2 [IU] via SUBCUTANEOUS
  Administered 2016-01-06: 3 [IU] via SUBCUTANEOUS
  Administered 2016-01-07 (×2): 1 [IU] via SUBCUTANEOUS
  Administered 2016-01-07 – 2016-01-08 (×3): 2 [IU] via SUBCUTANEOUS
  Administered 2016-01-10: 3 [IU] via SUBCUTANEOUS
  Administered 2016-01-10: 1 [IU] via SUBCUTANEOUS
  Administered 2016-01-10 – 2016-01-11 (×2): 2 [IU] via SUBCUTANEOUS
  Administered 2016-01-11 (×2): 1 [IU] via SUBCUTANEOUS
  Administered 2016-01-12: 2 [IU] via SUBCUTANEOUS

## 2016-01-06 NOTE — Progress Notes (Signed)
[facility] PROGRESS NOTE  [name] [id number] DOB: [birth year] DOA: 01/05/2016 PCP: [name], MD  Brief Summary  [name] is a 75 y.o. female with h/o COPD on 2L home O2 at night, DM2. Patient presents to the ED with c/o increasing fatigue, generalized weakness, SOB, cough. She discontinued lasix 4 days ago due to poor UOP, no N/V/D. No peripheral edema, no orthopnea. Parathyroidectomy 4 months ago, had subsequent calcium problems but calcium was normal 2 days ago.    Assessment/Plan  Acute on chronic respiratory failure secondary to COPD exacerbation and CAP -  Continue ceftriaxone and azithromycin -  Continue prednisone -  Continue dulera and spiriva with prn albuterol  Acute on chronic kidney disease stage 3, baseline creatinine of 1.2, 1.7 on admission and trending down.  Mild hyperkalemia likely related to AKI + ARB -  Hold ARB -  Continue IVF through tomorrow morning -  Repeat potassium in AM  Diabetes mellitus type 2, last A1c 7 03/2015 -  Continue lantus -  Add low dose SSI  Paroxysmal atrial fibrillation, CHADs2vasc = 6, SR with PVC on telemetry -  Continue eliquis   Nonischemic cardiomyopathy, previously EF 20-25% but EF recovered and now with chronic diastolic heart failure.  Still appears dehydrated -   Continue IVF and continue to hold lasix -   Daily weights and strict I/O  Crohn's disease, stable, continue sulfasalazine  Hypothyroidism, stable, continue synthroid  CAD, chest pain free.  Continue eliquis, atorvastatin, and metoprolol, ranolazine  OSA not using CPAP   Diet:  Diabetic healthy heart Access:  PIV IVF:  YES Proph:  lovenox  Code Status: full code Family Communication: patient alone  Disposition Plan: pending improvement in dyspnea, able to complete basic ADLs without severe SOB   Consultants:  none  Procedures:  CXR  Antibiotics:  Ceftriaxone 4/7  Azithromcyin 4/7   HPI/Subjective:  Not sure if  she is breathing better because she has not been out of bed today.  Denies fevers, chills.  Has felt very weak with no appetite for several days, but was able to eat some breakfast this morning.    Objective: Filed Vitals:   01/05/16 2228 01/06/16 0455 01/06/16 0904 01/06/16 0942  BP: 99/75 121/80 121/80   Pulse: 106 100 93   Temp: 97.7 F (36.5 C) 97.6 F (36.4 C) 98 F (36.7 C)   TempSrc: Oral Oral Oral   Resp: 20 18 18    Height:      Weight:      SpO2: 97% 96% 97% 97%    Intake/Output Summary (Last 24 hours) at 01/06/16 1018 Last data filed at 01/06/16 0930  Gross per 24 hour  Intake    815 ml  Output    350 ml  Net    465 ml   Filed Weights   01/05/16 1604  Weight: 115.667 kg (255 lb)   Body mass index is 35.58 kg/(m^2).  Exam:   General:  Adult female, No acute distress, on 2L [place]  HEENT:  NCAT, mildly dry mouth, sunken eyes  Cardiovascular:  Tachycardic IRRR, nl S1, S2 no mrg, 2+ pulses, warm extremities  Respiratory:  Diminished bilateral BS, no focal rales, rhonchi or obvious wheeze no increased WOB  Abdomen:   NABS, soft, NT/ND  MSK:   Normal tone and bulk, no LEE  Neuro:  Grossly intact  Data Reviewed: Basic Metabolic Panel:  Recent Labs Lab 01/05/16 1623 01/05/16 1835 01/06/16 0609  NA 140  --  137  K 5.4*  --  5.6*  CL 104  --  103  CO2 24  --  22  GLUCOSE 138*  --  153*  BUN 26*  --  27*  CREATININE 1.70*  --  1.65*  CALCIUM 9.5  --  9.0  PHOS  --  4.5  --    Liver Function Tests:  Recent Labs Lab 01/05/16 1835  AST 29  ALT 14  ALKPHOS 113  BILITOT 1.1  PROT 6.4*  ALBUMIN 2.9*    Recent Labs Lab 01/05/16 1835  LIPASE 21   No results for input(s): AMMONIA in the last 168 hours. CBC:  Recent Labs Lab 01/05/16 1623 01/05/16 1835 01/06/16 0609  WBC 7.9  --  5.2  NEUTROABS  --  75.0*  --   HGB 16.0*  --  15.3*  HCT 51.0*  --  49.5*  MCV 100.0  --  100.0  PLT 339  --  309    Recent Results (from the past 240  hour(s))  Culture, blood (routine x 2)     Status: None (Preliminary result)   Collection Time: 01/05/16  5:45 PM  Result Value Ref Range Status   Specimen Description BLOOD LEFT ANTECUBITAL  Final   Special Requests IN PEDIATRIC BOTTLE 2CC  Final   Culture NO GROWTH < 24 HOURS  Final   Report Status PENDING  Incomplete  Culture, blood (routine x 2)     Status: None (Preliminary result)   Collection Time: 01/05/16  5:55 PM  Result Value Ref Range Status   Specimen Description BLOOD LEFT FOREARM  Final   Special Requests IN PEDIATRIC BOTTLE 3CC  Final   Culture NO GROWTH < 24 HOURS  Final   Report Status PENDING  Incomplete     Studies: Dg Chest Port 1 View  01/05/2016  CLINICAL DATA:  Respiratory distress. EXAM: PORTABLE CHEST 1 VIEW COMPARISON:  11/01/2015 FINDINGS: Stable appearance of pacemaker. The heart is mildly enlarged and stable in appearance. Bilateral lower lung densities present, left greater than right, consistent with atelectasis versus infiltrates. No overt pulmonary edema. IMPRESSION: Stable cardiomegaly. Bilateral lower lung atelectasis versus infiltrates, left greater than right. Electronically Signed   By: Aletta Edouard M.D.   On: 01/05/2016 19:37    Scheduled Meds: . antiseptic oral rinse  7 mL Mouth Rinse BID  . apixaban  5 mg Oral BID  . atorvastatin  80 mg Oral QHS  . azithromycin  500 mg Oral Q24H  . cefTRIAXone (ROCEPHIN)  IV  1 g Intravenous Q24H  . folic acid  1 mg Oral Daily  . insulin glargine  10 Units Subcutaneous [id number]  . levothyroxine  75 mcg Oral QAC breakfast  . metoprolol  100 mg Oral BID  . mometasone-formoterol  2 puff Inhalation BID  . multivitamin with minerals  1 tablet Oral Daily  . pantoprazole  40 mg Oral Daily  . predniSONE  50 mg Oral Q breakfast  . ranolazine  500 mg Oral BID  . sulfaSALAzine  1,000 mg Oral BID  . tiotropium  1 capsule Inhalation Daily   Continuous Infusions: . sodium chloride 75 mL/hr at 01/05/16 2220     Principal Problem:   CAP (community acquired pneumonia) Active Problems:   Atrial fibrillation (HCC)   CKD (chronic kidney disease) stage 3, GFR 30-59 ml/min   Type 2 diabetes mellitus with diabetic nephropathy (HCC)   AKI (acute kidney injury) (HCC)   COPD (chronic obstructive pulmonary disease) (  [place])    Time spent: 30 min    Ellarae Nevitt, [facility] Pager [phone]. If 7PM-7AM, please contact night-coverage at [email or web address], password [email or web address] 01/06/2016, 10:18 AM  LOS: 1 day

## 2016-01-06 NOTE — Progress Notes (Signed)
PT Cancellation Note  Patient Details Name: [name] MRN: [id number] DOB: October 04, 1940   Cancelled Treatment:    Reason Eval/Treat Not Completed: Medical issues which prohibited therapy.  Patient with K+ of 5.6.  Will hold PT today and return tomorrow for PT evaluation if appropriate for patient.  Thank you.   Despina Pole 01/06/2016, 6:08 PM Carita Pian. Sanjuana Kava, [place] Pager [phone]

## 2016-01-07 ENCOUNTER — Inpatient Hospital Stay (HOSPITAL_COMMUNITY): Payer: Medicare Other

## 2016-01-07 LAB — BASIC METABOLIC PANEL
ANION GAP: 11 (ref 5–15)
BUN: 28 mg/dL — AB (ref 6–20)
CHLORIDE: 105 mmol/L (ref 101–111)
CO2: 22 mmol/L (ref 22–32)
Calcium: 8.7 mg/dL — ABNORMAL LOW (ref 8.9–10.3)
Creatinine, Ser: 1.86 mg/dL — ABNORMAL HIGH (ref 0.44–1.00)
GFR calc Af Amer: 29 mL/min — ABNORMAL LOW (ref >60)
GFR, EST NON AFRICAN AMERICAN: 25 mL/min — AB (ref >60)
GLUCOSE: 143 mg/dL — AB (ref 65–99)
POTASSIUM: 5.5 mmol/L — AB (ref 3.5–5.1)
Sodium: 138 mmol/L (ref 135–145)

## 2016-01-07 LAB — CBC
HEMATOCRIT: 49.6 % — AB (ref 36.0–46.0)
HEMOGLOBIN: 15.8 g/dL — AB (ref 12.0–15.0)
MCH: 32.4 pg (ref 26.0–34.0)
MCHC: 31.9 g/dL (ref 30.0–36.0)
MCV: 101.6 fL — AB (ref 78.0–100.0)
Platelets: 356 10*3/uL (ref 150–400)
RBC: 4.88 MIL/uL (ref 3.87–5.11)
RDW: 16.3 % — ABNORMAL HIGH (ref 11.5–15.5)
WBC: 8.5 10*3/uL (ref 4.0–10.5)

## 2016-01-07 LAB — GLUCOSE, CAPILLARY
GLUCOSE-CAPILLARY: 143 mg/dL — AB (ref 65–99)
GLUCOSE-CAPILLARY: 172 mg/dL — AB (ref 65–99)
GLUCOSE-CAPILLARY: 161 mg/dL — AB (ref 65–99)
Glucose-Capillary: 144 mg/dL — ABNORMAL HIGH (ref 65–99)

## 2016-01-07 LAB — PROCALCITONIN

## 2016-01-07 LAB — CALCIUM, IONIZED: CALCIUM, IONIZED, SERUM: 5 mg/dL (ref 4.5–5.6)

## 2016-01-07 MED ORDER — METOPROLOL TARTRATE 1 MG/ML IV SOLN: 5.0000 mg | Freq: Four times a day (QID) | INTRAVENOUS | Status: DC | PRN

## 2016-01-07 MED ORDER — DIPHENHYDRAMINE HCL 25 MG PO CAPS
25.0000 mg | ORAL_CAPSULE | Freq: Once | ORAL | Status: AC
  Administered 2016-01-07: 25 mg via ORAL
  Filled 2016-01-07: qty 1

## 2016-01-07 MED ORDER — FUROSEMIDE 10 MG/ML IJ SOLN
80.0000 mg | Freq: Two times a day (BID) | INTRAMUSCULAR | Status: DC
  Administered 2016-01-07 – 2016-01-08 (×3): 80 mg via INTRAVENOUS
  Filled 2016-01-07 (×3): qty 8

## 2016-01-07 MED ORDER — FUROSEMIDE 80 MG PO TABS: 80.0000 mg | ORAL_TABLET | Freq: Two times a day (BID) | ORAL | Status: DC

## 2016-01-07 NOTE — Discharge Instructions (Signed)

## 2016-01-07 NOTE — Progress Notes (Signed)
PT Cancellation Note  Patient Details Name: [name] MRN: [id number] DOB: 07-16-1941   Cancelled Treatment:    Reason Eval/Treat Not Completed: Medical issues which prohibited therapy Resting HR 120s up to 130, tele indicates ventricular bigeminy, potassium elevated. Will hold formal PT evaluation this AM and check back later to fully assess functional abilities.  Ellouise Newer 01/07/2016, 9:39 AM Elayne Snare, [place]

## 2016-01-07 NOTE — Progress Notes (Signed)
[facility] PROGRESS NOTE  [name] [id number] DOB: 1941/09/23 DOA: 01/05/2016 PCP: [name], MD  Brief Summary  [name] is a 75 y.o. female with h/o COPD on 2L home O2 at night, DM2. Patient presents to the ED with c/o increasing fatigue, generalized weakness, SOB, cough. She discontinued lasix 4 days ago due to poor UOP, no N/V/D. Denies peripheral edema, no orthopnea. Parathyroidectomy 4 months ago, had subsequent calcium problems but calcium was normal 2 days ago.    Assessment/Plan  Acute on chronic respiratory failure possibly secondary to COPD exacerbation and CAP, although I am suspicious that she has worsening heart failure symptoms with cardiac cachexia, decreasing dry weight, and poorer response to lasix.  I suspect that she is cold and wet.   -  Continue ceftriaxone and azithromycin -  Continue prednisone -  Continue dulera and spiriva with prn albuterol -  Check procalcitonin and stop antibiotics if low -  D/c IVF since no improvement in creatinine despite hydration and consider trial of diuresis  Nonischemic cardiomyopathy, previously EF 20-25% but EF recovered and now with chronic diastolic heart failure.   -  CXR suggests fluid in fissure, possible interstitial edema at the bilateral bases -  ECHO  - + 2L  -  Wt not recorded -  Trial of lasix 80mg  IV BID -  Consider cardiology consult if not improving with diuresis  Acute on chronic kidney disease stage 3, baseline creatinine of 1.2, 1.7 on admission and certainly not improving and may be worsening with IVF.  Mild hyperkalemia likely related to AKI + ARB -  Hold ARB -  D/C IVF  Diabetes mellitus type 2, last A1c 7 03/2015 -  Continue lantus -  Continue low dose SSI  Paroxysmal atrial fibrillation, CHADs2vasc = 6, SR with PVC on telemetry -  Continue eliquis  Crohn's disease, stable, continue sulfasalazine  Hypothyroidism, stable, continue synthroid  CAD, chest pain free.  Continue  eliquis, atorvastatin, and metoprolol, ranolazine  OSA not using CPAP   Diet:  Diabetic healthy heart Access:  PIV IVF:  off Proph:  lovenox  Code Status: full code Family Communication: patient alone  Disposition Plan: pending improvement in dyspnea, able to complete basic ADLs without severe SOB   Consultants:  none  Procedures:  CXR  Antibiotics:  Ceftriaxone 4/7  Azithromcyin 4/7   HPI/Subjective:  Still dyspneic, breathing is about the same.  Denies fevers, chills.    Objective: Filed Vitals:   01/06/16 2200 01/07/16 0600 01/07/16 0740 01/07/16 0751  BP: 120/71 97/65 91/70    Pulse: 94 92 50   Temp: 97.8 F (36.6 C) 97.2 F (36.2 C) 98 F (36.7 C)   TempSrc: Oral Oral Oral   Resp: 20 22 16    Height:      Weight:      SpO2: 97% 99% 97% 96%    Intake/Output Summary (Last 24 hours) at 01/07/16 1159 Last data filed at 01/07/16 0953  Gross per 24 hour  Intake   2840 ml  Output    800 ml  Net   2040 ml   Filed Weights   01/05/16 1604  Weight: 115.667 kg (255 lb)   Body mass index is 35.58 kg/(m^2).  Exam:   General:  Adult female, No acute distress, on 2L [place]  HEENT:  NCAT, mildly dry mouth, sunken eyes  Cardiovascular:  Tachycardic IRRR, nl S1, S2 no mrg, 2+ pulses, cold extremities, delayed capillary refill  Respiratory:  Diminished bilateral BS,  no focal rales, rhonchi or obvious wheeze no increased WOB  Abdomen:   NABS, soft, NT/ND  MSK:   Normal tone and bulk, trace bilateral LEE  Neuro:  Grossly intact  Data Reviewed: Basic Metabolic Panel:  Recent Labs Lab 01/05/16 1623 01/05/16 1835 01/06/16 0609 01/07/16 0720  NA 140  --  137 138  K 5.4*  --  5.6* 5.5*  CL 104  --  103 105  CO2 24  --  22 22  GLUCOSE 138*  --  153* 143*  BUN 26*  --  27* 28*  CREATININE 1.70*  --  1.65* 1.86*  CALCIUM 9.5  --  9.0 8.7*  PHOS  --  4.5  --   --    Liver Function Tests:  Recent Labs Lab 01/05/16 1835  AST 29  ALT 14  ALKPHOS  113  BILITOT 1.1  PROT 6.4*  ALBUMIN 2.9*    Recent Labs Lab 01/05/16 1835  LIPASE 21   No results for input(s): AMMONIA in the last 168 hours. CBC:  Recent Labs Lab 01/05/16 1623 01/05/16 1835 01/06/16 0609 01/07/16 0720  WBC 7.9  --  5.2 8.5  NEUTROABS  --  75.0*  --   --   HGB 16.0*  --  15.3* 15.8*  HCT 51.0*  --  49.5* 49.6*  MCV 100.0  --  100.0 101.6*  PLT 339  --  309 356    Recent Results (from the past 240 hour(s))  Culture, blood (routine x 2)     Status: None (Preliminary result)   Collection Time: 01/05/16  5:45 PM  Result Value Ref Range Status   Specimen Description BLOOD LEFT ANTECUBITAL  Final   Special Requests IN PEDIATRIC BOTTLE 2CC  Final   Culture NO GROWTH < 24 HOURS  Final   Report Status PENDING  Incomplete  Culture, blood (routine x 2)     Status: None (Preliminary result)   Collection Time: 01/05/16  5:55 PM  Result Value Ref Range Status   Specimen Description BLOOD LEFT FOREARM  Final   Special Requests IN PEDIATRIC BOTTLE 3CC  Final   Culture NO GROWTH < 24 HOURS  Final   Report Status PENDING  Incomplete     Studies: Dg Chest Port 1 View  01/05/2016  CLINICAL DATA:  Respiratory distress. EXAM: PORTABLE CHEST 1 VIEW COMPARISON:  11/01/2015 FINDINGS: Stable appearance of pacemaker. The heart is mildly enlarged and stable in appearance. Bilateral lower lung densities present, left greater than right, consistent with atelectasis versus infiltrates. No overt pulmonary edema. IMPRESSION: Stable cardiomegaly. Bilateral lower lung atelectasis versus infiltrates, left greater than right. Electronically Signed   By: Aletta Edouard M.D.   On: 01/05/2016 19:37    Scheduled Meds: . antiseptic oral rinse  7 mL Mouth Rinse BID  . apixaban  5 mg Oral BID  . atorvastatin  80 mg Oral QHS  . azithromycin  500 mg Oral Q24H  . cefTRIAXone (ROCEPHIN)  IV  1 g Intravenous Q24H  . folic acid  1 mg Oral Daily  . [START ON 01/08/2016] furosemide  80 mg  Oral BID  . insulin aspart  0-9 Units Subcutaneous TID WC  . insulin glargine  10 Units Subcutaneous Daily  . levothyroxine  75 mcg Oral QAC breakfast  . metoprolol  100 mg Oral BID  . mometasone-formoterol  2 puff Inhalation BID  . multivitamin with minerals  1 tablet Oral Daily  . pantoprazole  40 mg Oral  Daily  . predniSONE  50 mg Oral Q breakfast  . ranolazine  500 mg Oral BID  . sulfaSALAzine  1,000 mg Oral BID  . tiotropium  1 capsule Inhalation Daily   Continuous Infusions:    Principal Problem:   CAP (community acquired pneumonia) Active Problems:   Atrial fibrillation (HCC)   CKD (chronic kidney disease) stage 3, GFR 30-59 ml/min   Type 2 diabetes mellitus with diabetic nephropathy (HCC)   AKI (acute kidney injury) ([place])   COPD (chronic obstructive pulmonary disease) ([place])    Time spent: 30 min    Nathin Saran, [facility] Pager [phone]. If 7PM-7AM, please contact night-coverage at [email or web address], password [facility] 01/07/2016, 11:59 AM  LOS: 2 days

## 2016-01-07 NOTE — Evaluation (Signed)
Physical Therapy Evaluation Patient Details Name: [name] MRN: [id number] DOB: 04-Jan-1941 Today's Date: 01/07/2016   History of Present Illness  75 year old female PMH of Chronic combined systolic and diastolic CHF, morbid obesity, HLD, tachybradycardia syndrome status post PPM, COPD, depression, GERD, hypothyroid, A. fib, DM, CAD, OSA-not on CPAP, chronic respiratory failure on home oxygen 2 L/m at bedtime, recent parathyroidectomy, recent hospitalization 10/10/15-10/13/15 for acute on chronic combined CHF, A. fib with RVR and symptomatic hypocalcemia from recent parathyroidectomy,. Admitted for Acute on chronic respiratory failure possibly secondary to COPD exacerbation and CAP  Clinical Impression  Pt admitted with above complications. Pt currently with functional limitations due to the deficits listed below (see PT Problem List). Very fatigued with short distance ambulation; requiring min assist for walker control and safety. SpO2 88% on room air 3/4 dyspnea, mid 90s at rest on 2L supplemental O2. HR up to 120 (90s at rest). Tolerated exercises well. Lives alone and was struggling with some ADLs at home PTA.  Pt will benefit from skilled PT to increase their independence and safety with mobility to allow discharge to the venue listed below.       Follow Up Recommendations SNF -Likely short term, however will continue to monitor and progress as tolerated, updating recommendations as appropriate.    Equipment Recommendations  None recommended by PT    Recommendations for Other Services       Precautions / Restrictions Precautions Precautions: Fall Precaution Comments: monitor O2 Restrictions Weight Bearing Restrictions: No      Mobility  Bed Mobility Overal bed mobility: Modified Independent             General bed mobility comments: Effortful for patient with HOB elevated but did not require physical assist.  Transfers Overall transfer level: Needs assistance Equipment  used: Rolling walker (2 wheeled) Transfers: Sit to/from Stand Sit to Stand: Min guard         General transfer comment: Close guard for safety. VC for hand placement, leans heavily over RW before standing upright. VC for technique and posture. Performed from bed and BSC.  Ambulation/Gait Ambulation/Gait assistance: Min assist Ambulation Distance (Feet): 20 Feet (+5) Assistive device: Rolling walker (2 wheeled) Gait Pattern/deviations: Step-through pattern;Decreased stride length;Trunk flexed;Wide base of support;Shuffle Gait velocity: decreased Gait velocity interpretation: <1.8 ft/sec, indicative of risk for recurrent falls General Gait Details: Frequent VC for upright posture and walker placement for proximity. Fair to poor control of RW requiring min assist for navigation with this device. 3/4 dyspnea, SpO2 88% on room air, improved with seated rest and 2L supplemental O2 to mid 90s. Very fatigued with short distance. Cues for safety.  Stairs            Wheelchair Mobility    Modified Rankin (Stroke Patients Only)       Balance Overall balance assessment: Needs assistance Sitting-balance support: No upper extremity supported;Feet supported Sitting balance-Leahy Scale: Good     Standing balance support: Bilateral upper extremity supported Standing balance-Leahy Scale: Poor                               Pertinent Vitals/Pain Pain Assessment: No/denies pain    Home Living Family/patient expects to be discharged to:: Private residence Living Arrangements: Alone Available Help at Discharge: Family;Available PRN/intermittently Type of Home: House Home Access: Stairs to enter Entrance Stairs-Rails: None Entrance Stairs-Number of Steps: 1 Home Layout: One level Home Equipment: Walker - 2 wheels;Walker -  4 wheels;Tub bench;Shower seat;Grab bars - toilet;Wheelchair - manual;Hospital bed      Prior Function Level of Independence: Independent with  assistive device(s)         Comments: States she has been having difficulty with endurance while bathing herself. Rollator for mobility.     Hand Dominance   Dominant Hand: Right    Extremity/Trunk Assessment   Upper Extremity Assessment: Defer to OT evaluation           Lower Extremity Assessment: Generalized weakness         Communication   Communication: HOH  Cognition Arousal/Alertness: Awake/alert Behavior During Therapy: WFL for tasks assessed/performed Overall Cognitive Status: Within Functional Limits for tasks assessed                      General Comments      Exercises General Exercises - Lower Extremity Ankle Circles/Pumps: AROM;Both;15 reps;Seated Quad Sets: Strengthening;Both;15 reps;Seated Gluteal Sets: Strengthening;Both;10 reps;Seated Long Arc Quad: Strengthening;Both;15 reps;Seated Hip Flexion/Marching: Strengthening;Both;15 reps;Seated      Assessment/Plan    PT Assessment Patient needs continued PT services  PT Diagnosis Difficulty walking;Abnormality of gait;Generalized weakness   PT Problem List Decreased strength;Decreased activity tolerance;Decreased balance;Decreased mobility;Decreased knowledge of use of DME;Cardiopulmonary status limiting activity;Obesity  PT Treatment Interventions DME instruction;Gait training;Functional mobility training;Therapeutic activities;Therapeutic exercise;Balance training;Patient/family education   PT Goals (Current goals can be found in the Care Plan section) Acute Rehab PT Goals Patient Stated Goal: Feel stronger PT Goal Formulation: With patient Time For Goal Achievement: 01/21/16 Potential to Achieve Goals: Good    Frequency Min 3X/week   Barriers to discharge Decreased caregiver support lives alone, limited help    Co-evaluation               End of Session Equipment Utilized During Treatment: Gait belt Activity Tolerance: Patient limited by fatigue Patient left: in  bed;with call bell/phone within reach;with bed alarm set Nurse Communication: Mobility status (sats)         Time: [id number] PT Time Calculation (min) (ACUTE ONLY): 21 min   Charges:   PT Evaluation $PT Eval Moderate Complexity: 1 Procedure     PT G CodesEllouise Newer 01/07/2016, 2:23 PM  Camille Bal [place], [place]

## 2016-01-08 ENCOUNTER — Inpatient Hospital Stay (HOSPITAL_COMMUNITY): Payer: Medicare Other

## 2016-01-08 DIAGNOSIS — I25119 Atherosclerotic heart disease of native coronary artery with unspecified angina pectoris: Secondary | ICD-10-CM

## 2016-01-08 DIAGNOSIS — I255 Ischemic cardiomyopathy: Secondary | ICD-10-CM

## 2016-01-08 DIAGNOSIS — N179 Acute kidney failure, unspecified: Secondary | ICD-10-CM

## 2016-01-08 DIAGNOSIS — R0600 Dyspnea, unspecified: Secondary | ICD-10-CM

## 2016-01-08 DIAGNOSIS — I482 Chronic atrial fibrillation: Secondary | ICD-10-CM

## 2016-01-08 LAB — CBC
HEMATOCRIT: 50.4 % — AB (ref 36.0–46.0)
HEMOGLOBIN: 15.6 g/dL — AB (ref 12.0–15.0)
MCH: 31.4 pg (ref 26.0–34.0)
MCHC: 31 g/dL (ref 30.0–36.0)
MCV: 101.4 fL — ABNORMAL HIGH (ref 78.0–100.0)
PLATELETS: 306 10*3/uL (ref 150–400)
RBC: 4.97 MIL/uL (ref 3.87–5.11)
RDW: 16.1 % — ABNORMAL HIGH (ref 11.5–15.5)
WBC: 7.9 10*3/uL (ref 4.0–10.5)

## 2016-01-08 LAB — GLUCOSE, CAPILLARY
GLUCOSE-CAPILLARY: 163 mg/dL — AB (ref 65–99)
GLUCOSE-CAPILLARY: 182 mg/dL — AB (ref 65–99)
GLUCOSE-CAPILLARY: 188 mg/dL — AB (ref 65–99)
Glucose-Capillary: 113 mg/dL — ABNORMAL HIGH (ref 65–99)

## 2016-01-08 LAB — BASIC METABOLIC PANEL
ANION GAP: 14 (ref 5–15)
BUN: 33 mg/dL — ABNORMAL HIGH (ref 6–20)
CHLORIDE: 100 mmol/L — AB (ref 101–111)
CO2: 25 mmol/L (ref 22–32)
Calcium: 8.8 mg/dL — ABNORMAL LOW (ref 8.9–10.3)
Creatinine, Ser: 1.91 mg/dL — ABNORMAL HIGH (ref 0.44–1.00)
GFR calc non Af Amer: 25 mL/min — ABNORMAL LOW (ref >60)
GFR, EST AFRICAN AMERICAN: 28 mL/min — AB (ref >60)
Glucose, Bld: 118 mg/dL — ABNORMAL HIGH (ref 65–99)
POTASSIUM: 4.8 mmol/L (ref 3.5–5.1)
SODIUM: 139 mmol/L (ref 135–145)

## 2016-01-08 LAB — BRAIN NATRIURETIC PEPTIDE: B NATRIURETIC PEPTIDE 5: 790 pg/mL — AB (ref 0.0–100.0)

## 2016-01-08 MED ORDER — FUROSEMIDE 10 MG/ML IJ SOLN
80.0000 mg | Freq: Two times a day (BID) | INTRAMUSCULAR | Status: DC
  Administered 2016-01-08 – 2016-01-10 (×4): 80 mg via INTRAVENOUS
  Filled 2016-01-08 (×4): qty 8

## 2016-01-08 MED ORDER — DIPHENHYDRAMINE HCL 25 MG PO CAPS
25.0000 mg | ORAL_CAPSULE | Freq: Every evening | ORAL | Status: DC | PRN
  Administered 2016-01-09: 25 mg via ORAL
  Filled 2016-01-08: qty 1

## 2016-01-08 NOTE — Progress Notes (Signed)
OT Cancellation Note  Patient Details Name: [name] MRN: [id number] DOB: April 03, 1941   Cancelled Treatment:    Reason Eval/Treat Not Completed: Other (comment) (Pt eating lunch, then ECHO tech going into room for testing). Will check back as schedule allows for OT eval. Thank you for the order.   Chrys Racer , MS, OTR/L, CLT  01/08/2016, 1:20 PM

## 2016-01-08 NOTE — Care Management Important Message (Signed)
Important Message  Patient Details  Name: [name] MRN: [id number] Date of Birth: 1941/03/24   Medicare Important Message Given:  Yes    Nathen May 01/08/2016, 12:59 PM

## 2016-01-08 NOTE — Consult Note (Signed)
CARDIOLOGY CONSULT NOTE   Patient ID: HILAREE ROTRUCK MRN: [id number] DOB/AGE: [birth year] 75 y.o.  Admit date: 01/05/2016  Primary Physician   [name], MD Primary Cardiologist: Dr. Tomma Lightning. Burt Knack Reason for Consultation: CHF  HPI: Ms. Berezin is a 75 year old female with a past medical history of NICM, diastolic CHF, HLD, tachy-brady syndrome s/p Medtronic PPM, Aflutter (on Eliquis), CAD, DM, and COPD.    She presented to the ED on 01/05/16 with SOB, weakness and cough for 7 days.  She had stopped her Lasix 4 days prior citing that she had decreased urine output. She is being treated for COPD exacerbation and CAP. Her BNP was 844.9 on admission. Cardiology was consulted to help manage her CHF.   Her last echo was in January of 2017, no EF available, her LV showed mild LVH, RV is moderately dilated with systolic function moderately to severely reduced. She had LHC in September 2013 which showed severe stenosis of her LAD and normal LV function.  It was not PCI amendable due to complex bifurcation of the LAD and she was referred to CVTS surgery outpatient. It was determined that surgical revascularization would not come with any significant long term survival benefit per surgical consult note on 06/22/12.    She has been in bed for the past week at home.  She sleeps with 2 pillows at night, denies PND.  She gets SOB with minimal activity, cannot walk from bedroom to the kitchen without having to stop.  She eats lunch meat, hot dogs and canned veggies at home. She feels tired, weak. She thinks that her SOB has worsened since admission.  Denies chest pain.     Past Medical History  Diagnosis Date  . Nonischemic cardiomyopathy ([place])     history of,  EF 20-25% at left heart cath in 06/2007; echo 2011 had normal LV function  . Chronic diastolic heart failure ([place])     Echo 06/2010: Moderate LVH, EF 55-65%, normal wall motion, mild MR, moderate to severe LAE, mild RAE.   . Morbid obesity  ([place])   . Hyperlipidemia   . Tachy-brady syndrome ([place])     status post implant of a medtronic dual-chamber pacemaker in 2001.  explanted in 2010 -- [place] pacemaker in 2010.  Marland Kitchen Chronic obstructive pulmonary disease ([place])   . Osteoporosis   . Depression   . GERD (gastroesophageal reflux disease)   . Crohn's disease ([place])   . Hypothyroidism     treated  . Seasonal allergies   . Pacemaker     Medtronic  . Atrial fibrillation ([place])     Status post pulmonary vein isolation 2009 at [facility]  . CAD (coronary artery disease)     LHC 05/2007: pLAD 70-80%, pRCA 40%, EF 20-25%. LAD lesion treated medically.   . Coronary artery disease 06/15/2012  . Atrial fibrillation ([place]) 12/18/2007    Annotation: refractory Qualifier: Diagnosis of  By: Doy Mince LPN, Megan    . Morbid obesity with BMI of 40.0-44.9, adult ([place]) 06/22/2012  . Diabetes mellitus ([place]) 06/22/2012  . Diabetes mellitus without complication ([place])   . COPD (chronic obstructive pulmonary disease) ([place])   . Gout   . Dysrhythmia     atrial fib  . Peripheral vascular disease (H[facility]) 15    rt arm clots  . Obstructive sleep apnea     continuous positive airway pressure not using at present  . Shortness of breath dyspnea  exersion  . CHF (congestive heart failure) ([place])   . On home oxygen therapy     "2L at night" (10/03/2015)     Past Surgical History  Procedure Laterality Date  . Pulmonary vein isolation  05/12/2008    RFCA atrial fibrillation  . Post ablation pseudoaneurysm      at A fib ablation  . Pacemaker insertion  2010    medtronic ADAPTA   . Tonsillectomy    . Embolectomy Right 08/16/2014    Procedure: EMBOLECTOMY- RIGHT BRACHIAL ARTERY;  Surgeon: Serafina [place], MD;  Location: [facility] OR;  Service: Vascular;  Laterality: Right;  . Cardiac catheterization  08/2000    noncritical disease mid RCA, EF preserved  . Cardiac catheterization  05/29/2007    noncritical disease, EF 20-25%  . Cataract  extraction w/ intraocular lens  implant, bilateral Bilateral 2014-2016  . Parathyroidectomy Right 01/30/2015    Procedure: PARATHYROIDECTOMY;  Surgeon: Armandina Gemma, MD;  Location: [place];  Service: General;  Laterality: Right;  . Parathyroidectomy Left 10/03/2015    Left inferior parathyroidectomy w/neck exploration  . Parathyroidectomy Left 10/03/2015    Procedure: LEFT PARATHYROID EXPLORATION AND  PARATHYROIDECTOMY;  Surgeon: Armandina Gemma, MD;  Location: [place];  Service: General;  Laterality: Left;    No Known Allergies  I have reviewed the patient's current medications . antiseptic oral rinse  7 mL Mouth Rinse BID  . apixaban  5 mg Oral BID  . atorvastatin  80 mg Oral QHS  . folic acid  1 mg Oral Daily  . furosemide  80 mg Intravenous BID  . insulin aspart  0-9 Units Subcutaneous TID WC  . insulin glargine  10 Units Subcutaneous Daily  . levothyroxine  75 mcg Oral QAC breakfast  . metoprolol  100 mg Oral BID  . mometasone-formoterol  2 puff Inhalation BID  . multivitamin with minerals  1 tablet Oral Daily  . pantoprazole  40 mg Oral Daily  . ranolazine  500 mg Oral BID  . sulfaSALAzine  1,000 mg Oral BID  . tiotropium  1 capsule Inhalation Daily     albuterol, metoprolol  Prior to Admission medications   Medication Sig Start Date End Date Taking? Authorizing Provider  albuterol (PROVENTIL HFA;VENTOLIN HFA) 108 (90 BASE) MCG/ACT inhaler Inhale 2 puffs into the lungs every 6 (six) hours as needed for wheezing or shortness of breath.   Yes Historical Provider, MD  atorvastatin (LIPITOR) 80 MG tablet Take 80 mg by mouth at bedtime.     Yes Historical Provider, MD  colchicine 0.6 MG tablet Take 1-2 tablets by mouth daily as needed (gout). TAKE 2 TABLETS BY MOUTH AS ONE DOSE, THEN 1 TABLET IN 1 HOUR FOR ACUTE GOUT EPISODE 08/09/15  Yes Historical Provider, MD  diphenhydramine-acetaminophen (TYLENOL PM) 25-500 MG TABS tablet Take 2 tablets by mouth at bedtime as needed (sleep/pain).   Yes  Historical Provider, MD  ELIQUIS 5 MG TABS tablet TAKE 1 TABLET BY MOUTH TWICE A DAY 08/21/15  Yes Deboraha Sprang, MD  Fiber CAPS Take 1 tablet by mouth at bedtime.    Yes Historical Provider, MD  Fluticasone-Salmeterol (ADVAIR) 250-50 MCG/DOSE AEPB Inhale 1 puff into the lungs every 12 (twelve) hours. 01/17/15  Yes Kathee Delton, MD  folic acid (FOLVITE) 1 MG tablet Take 1 mg by mouth daily. 06/01/15  Yes Historical Provider, MD  furosemide (LASIX) 40 MG tablet TAKE 80 MG BY MOUTH TWICE A DAY 10/13/15  Yes Louellen Molder, MD  insulin glargine (LANTUS) 100 UNIT/ML injection Inject 0.12 mLs (12 Units total) into the skin every morning. Patient taking differently: Inject 10 Units into the skin every morning.  11/01/15  Yes Modena Jansky, MD  KLOR-CON M20 20 MEQ tablet TAKE 1 TABLET BY MOUTH TWICE DAILY 04/13/15  Yes Deboraha Sprang, MD  levothyroxine (SYNTHROID, LEVOTHROID) 75 MCG tablet Take 75 mcg by mouth daily. 01/20/15  Yes Historical Provider, MD  loperamide (IMODIUM A-D) 2 MG tablet Take 2 mg by mouth 4 (four) times daily as needed for diarrhea or loose stools.   Yes Historical Provider, MD  losartan (COZAAR) 50 MG tablet Take 50 mg by mouth at bedtime.  01/16/15  Yes Historical Provider, MD  metoprolol (LOPRESSOR) 100 MG tablet TAKE 1 TABLET BY MOUTH TWICE A DAY 12/28/15  Yes Deboraha Sprang, MD  Multiple Vitamins-Minerals (MULTIVITAMIN WITH MINERALS) tablet Take 1 tablet by mouth daily.   Yes Historical Provider, MD  nitroGLYCERIN (NITROSTAT) 0.4 MG SL tablet Place 1 tablet (0.4 mg total) under the tongue every 5 (five) minutes as needed for chest pain. 07/07/14 01/05/16 Yes Deboraha Sprang, MD  omeprazole (PRILOSEC OTC) 20 MG tablet Take 20 mg by mouth daily.     Yes Historical Provider, MD  RANEXA 500 MG 12 hr tablet TAKE 1 TABLET BY MOUTH TWICE DAILY 12/26/15  Yes Deboraha Sprang, MD  sulfaSALAzine (AZULFIDINE) 500 MG tablet Take 1,000 mg by mouth 2 (two) times daily. 03/16/15  Yes Historical Provider,  MD  Tiotropium Bromide Monohydrate (SPIRIVA RESPIMAT) 2.5 MCG/ACT AERS Inhale 2 puffs into the lungs daily. 03/16/15  Yes Collene Gobble, MD  Vitamin D, Ergocalciferol, (DRISDOL) 50000 units CAPS capsule Take 50,000 Units by mouth every Sunday. 07/28/15  Yes Historical Provider, MD     Social History   Social History  . Marital Status: Divorced    Spouse Name: N/A  . Number of Children: N/A  . Years of Education: N/A   Occupational History  . retired    Social History Main Topics  . Smoking status: Former Smoker -- 3.00 packs/day for 32 years    Quit date: 09/30/1986  . Smokeless tobacco: Former Systems developer    Quit date: 08/15/1987  . Alcohol Use: No  . Drug Use: No  . Sexual Activity: No   Other Topics Concern  . Not on file   Social History Narrative   ** Merged History Encounter **        Family Status  Relation Status Death Age  . Mother Deceased 76  . Father Deceased 36   Family History  Problem Relation Age of Onset  . Breast cancer Mother   . Heart disease Father   . Emphysema Father      ROS:  Full 14 point review of systems complete and found to be negative unless listed above.  Physical Exam: Blood pressure 121/61, pulse 102, temperature 98.7 F (37.1 C), temperature source Oral, resp. rate 20, height 5\' 11"  (1.803 m), weight 282 lb (127.914 kg), SpO2 95 %.  General: Well developed, well nourished,obese female in no acute distress Head: Eyes PERRLA, No xanthomas. Normocephalic and atraumatic, oropharynx without edema or exudate. Dentition: Good Lungs: Diminished in all lobes.  Heart: Heart irregular rate and rhythm with S1, S2 , 1/6 systolic murmur. Pulses are 1+ extrem.   Neck: No carotid bruits. No lymphadenopathy. No JVD. Abdomen: Bowel sounds present, abdomen soft and non-tender without masses or hernias noted. Msk:  No spine or  cva tenderness. No weakness, no joint deformities or effusions. Extremities: No clubbing or cyanosis. Cool to touch.  +2 pitting  edema in BLE.  Neuro: Alert and oriented X 3. No focal deficits noted. Psych:  Good affect, responds appropriately Skin: No rashes or lesions noted.  Labs:   Lab Results  Component Value Date   WBC 7.9 01/08/2016   HGB 15.6* 01/08/2016   HCT 50.4* 01/08/2016   MCV 101.4* 01/08/2016   PLT 306 01/08/2016    Recent Labs  01/05/16 1835  INR 1.53*    Recent Labs  01/05/16 1643  TROPIPOC 0.03   B NATRIURETIC PEPTIDE  Date/Time Value Ref Range Status  01/05/2016 05:45 PM 844.9* 0.0 - 100.0 pg/mL Final  10/27/2015 01:17 PM 1318.1* 0.0 - 100.0 pg/mL Final    LIPASE  Date/Time Value Ref Range Status  01/05/2016 06:35 PM 21 11 - 51 U/L Final   TSH  Date/Time Value Ref Range Status  01/05/2016 06:36 PM 4.493 0.350 - 4.500 uIU/mL Final  06/27/2008 02:51 PM 2.40 0.35-5.50 microintl units/mL Final   T3, FREE  Date/Time Value Ref Range Status  01/05/2016 06:36 PM 1.7* 2.0 - 4.4 pg/mL Final    Comment:    (NOTE) Performed At: [facility] [address] [place], [place] [id number] Lindon Romp MD [id number]    Echo: Jan. 2017 - Left ventricle: The cavity size was normal. Wall thickness was  increased in a pattern of mild LVH. - Mitral valve: There was mild regurgitation. - Left atrium: The atrium was moderately dilated. - Right ventricle: The cavity size was moderately dilated. Systolic  function was moderately to severely reduced. - Right atrium: The atrium was moderately to severely dilated. - Tricuspid valve: There was moderate regurgitation. - Pulmonary arteries: PA peak pressure: 70 mm Hg (S). - Pericardium, extracardiac: A small pericardial effusion was  identified.  Cath report 06/15/2012 Cardiac Output (Fick) 4.6  Cardiac Index (Fick) 1.9  Coronary angiography: Coronary dominance: right   Left mainstem: Mild calcification, widely patent.  Left anterior descending (LAD): The proximal LAD is severely calcified. There is 70-75%  proximal LAD stenosis which is grossly unchanged from the 2008 study. The area of stenosis begins just beyond the first diagonal branch which is moderate in caliber and has no significant disease. However, there is a large distribution second diagonal with 95% ostial stenosis now present. this has markedly progressed since the previous study. There is associated heavy calcification and diffuse disease of the vessel. The mid LAD beyond the diagonal is diffusely diseased without focal stenosis. The distal portion of the LAD is very small in caliber.   Left circumflex (LCx): the left circumflex supplies 2 obtuse marginals. There is minor nonobstructive disease without significant stenosis.   Right coronary artery (RCA): the RCA has mild diffuse disease. The proximal vessel has her to percent stenosis. The PDA and PLA branches have no significant stenosis. There is no high-grade disease throughout the course of the RCA.  Left ventriculography: Left ventricular systolic function is normal, LVEF is estimated at 55%, there is no significant mitral regurgitation   Final Conclusions:  1. Moderately severe stenosis of the proximal LAD with severe stenosis of the second diagonal 2. Nonobstructive stenosis of the left circumflex and right coronary artery 3. Normal LV function  Recommendations: Will need to consider revascularization options. In my opinion, her anatomy presents challenges for PCI because of the LAD diagonal bifurcation, but also was not optimal for coronary bypass because of small  and diseased target vessels. In addition, the patient has long-standing atrial fibrillation and requires chronic anticoagulation. Additional surgical risk is related to her morbid obesity with BMI greater than 40. I will review her films with colleagues and likely will send her for an outpatient cardiac surgery evaluation ECG: Aflutter   Radiology:  Dg Chest Port 1 View  01/07/2016  CLINICAL DATA:  Elevated heart  rate. Fatigue, shortness of breath and cough. EXAM: PORTABLE CHEST 1 VIEW COMPARISON:  01/05/2016 FINDINGS: There is a left chest wall pacer device with leads in the right atrial appendage right ventricle. Moderate cardiac enlargement. Bilateral pleural effusions are noted left greater right. Mild interstitial edema. IMPRESSION: 1. Cardiac enlargement and CHF. Electronically Signed   By: Kerby Moors M.D.   On: 01/07/2016 12:08    ASSESSMENT AND PLAN:    Principal Problem:   CAP (community acquired pneumonia) Active Problems:   Atrial fibrillation (HCC)   CKD (chronic kidney disease) stage 3, GFR 30-59 ml/min   Type 2 diabetes mellitus with diabetic nephropathy (HCC)   AKI (acute kidney injury) ([place])   COPD (chronic obstructive pulmonary disease) ([place])   1. Acute on chronic diastolic and systolic CHF: She has gotten 3 doses of IV Lasix, negative 1.8 liters for the past 24 hours. She says her dry weight is 255, today she is 282.  Echo pending, the last 2 echos in her chart do not estimate LVEF.  Last EF reported was 45-50% in September 2015.  She will need continued IV diuresis, her GFR is 25 and creatinine is 1.91.  We will monitor closely, however as creatinine continues to rise with IV Lasix, she would benefit from inotrope therapy. Will consult CHF team.  BNP is 790 today.  She is on BB, no ACE in setting of CKD. Her BP is stable, HR is rate controlled, however she remains in Aflutter.    2. CAD: Cath report above. LHC showed severe stenosis of her LAD and normal LV function.  It was not PCI amendable due to complex bifurcation of the LAD and she was referred to CVTS surgery outpatient. It was determined that surgical revascularization would not come with any significant long term survival benefit per surgical consult note on 06/22/12.  No anginal symptoms currently.   3. Chronic Atrial fib/flutter: Patient is in afib with rate control <100.  She is on Eliquis.  This patients CHA2DS2-VASc  Score and unadjusted Ischemic Stroke Rate (% per year) is equal to 6, 9.7 % stroke rate/year from a score of 6 Above score calculated as 1 point each if present [CHF, HTN, DM, Vascular=MI/PAD/Aortic Plaque, Age if 65-74, or Female], 2 points each if present [Age > 75, or Stroke/TIA/TE] She is followed by Dr.Klein.   Signed: Arbutus Leas, NP 01/08/2016 11:13 AM Pager [phone]  Co-Sign MD   I have examined the patient and reviewed assessment and plan and discussed with patient.  Agree with above as stated.  Peristent pulmonary edema despite diuretics.  Worsening renal function in the setting of diuretic use and prior EF in the 25%- although this qualitatively has improved.  Recommend CHF consult for inotropes to help with diuresis.  Her legs are bluish and cold.    Down the road, should consider left heart cath to see if LAD would now be amenable to PCI.  I personally reviewed the films from 2013.  I think the LAD would have been fixable at that time.  There was a large diagonal which could  not have been fixed with PCI.  May need to see if anatomy has changed given the clinical change.  Nakima Fluegge S.

## 2016-01-08 NOTE — Progress Notes (Signed)
Physical Therapy Treatment Patient Details Name: [name] MRN: [id number] DOB: 1940/12/09 Today's Date: 01/08/2016    History of Present Illness 75 year old female PMH of Chronic combined systolic and diastolic CHF, morbid obesity, HLD, tachybradycardia syndrome status post PPM, COPD, depression, GERD, hypothyroid, A. fib, DM, CAD, OSA-not on CPAP, chronic respiratory failure on home oxygen 2 L/m at bedtime, recent parathyroidectomy, recent hospitalization 10/10/15-10/13/15 for acute on chronic combined CHF, A. fib with RVR and symptomatic hypocalcemia from recent parathyroidectomy,. Admitted for Acute on chronic respiratory failure possibly secondary to COPD exacerbation and CAP    PT Comments    Pt continues to fatigue very quickly with mobility to the point that she cannot tolerate household distances, even on O2. Sats dropped to 88% with ambulation on 2L O2 and 86% at rest on RA. PT will continue to follow.   Follow Up Recommendations  SNF     Equipment Recommendations  None recommended by PT    Recommendations for Other Services       Precautions / Restrictions Precautions Precautions: Fall Precaution Comments: monitor O2 Restrictions Weight Bearing Restrictions: No    Mobility  Bed Mobility               General bed mobility comments: pt received sitting up  Transfers Overall transfer level: Needs assistance Equipment used: Rolling walker (2 wheeled) Transfers: Sit to/from [facility] Sit to Stand: Min guard;Min assist Stand pivot transfers: Min guard       General transfer comment: min-guard for standing from [facility] but required min A from recliner, lower surface. Pt sits with lack of control to recliner as well. Extra O2 tubing added so pt able to go from recliner to [facility] without coming off O2.   Ambulation/Gait Ambulation/Gait assistance: Min assist Ambulation Distance (Feet): 20 Feet Assistive device: Rolling walker (2 wheeled) Gait  Pattern/deviations: Wide base of support;Trunk flexed;Shuffle Gait velocity: decreased Gait velocity interpretation: <1.8 ft/sec, indicative of risk for recurrent falls General Gait Details: vc's for remaining within RW and RW guided with turning, pt states that she has a much easier time with her rollator. Pt's O2 sats down to 88% on 2L O2 and HR up to 120's. Pt very fatigued after standing to wash hands and only 20' of ambulation.    Stairs            Wheelchair Mobility    Modified Rankin (Stroke Patients Only)       Balance Overall balance assessment: Needs assistance Sitting-balance support: No upper extremity supported;Feet supported Sitting balance-Leahy Scale: Good     Standing balance support: Bilateral upper extremity supported Standing balance-Leahy Scale: Poor Standing balance comment: unable to stand to wash hands without leaning onto forearms                    Cognition Arousal/Alertness: Awake/alert Behavior During Therapy: WFL for tasks assessed/performed Overall Cognitive Status: Within Functional Limits for tasks assessed                      Exercises General Exercises - Lower Extremity Long Arc Quad: AROM;Both;10 reps;Seated Hip Flexion/Marching: AROM;Both;10 reps;Seated    General Comments General comments (skin integrity, edema, etc.): O2 sats up to 93% on 2L O2 with 5 mins rest after activity      Pertinent Vitals/Pain Pain Assessment: No/denies pain    Home Living  Prior Function            PT Goals (current goals can now be found in the care plan section) Acute Rehab PT Goals Patient Stated Goal: Feel stronger PT Goal Formulation: With patient Time For Goal Achievement: 01/21/16 Potential to Achieve Goals: Good Progress towards PT goals: Progressing toward goals    Frequency  Min 3X/week    PT Plan Current plan remains appropriate    Co-evaluation             End of  Session Equipment Utilized During Treatment: Gait belt;Oxygen Activity Tolerance: Patient limited by fatigue Patient left: in chair;with call bell/phone within reach     Time: 1237-1301 PT Time Calculation (min) (ACUTE ONLY): 24 min  Charges:  $Gait Training: 8-22 mins $Therapeutic Activity: 8-22 mins                    G Codes:     Leighton Roach, PT  Acute Rehab Services  [phone]  Leighton Roach 01/08/2016, 1:38 PM

## 2016-01-08 NOTE — Progress Notes (Signed)
[facility] PROGRESS NOTE  [name] [id number] DOB: 1941/08/01 DOA: 01/05/2016 PCP: [name], MD  Brief Summary  [name] is a 75 y.o. female with h/o COPD on 2L home O2 at night, DM2. Patient presents to the ED with c/o increasing fatigue, generalized weakness, SOB, cough. She discontinued lasix 4 days ago due to poor UOP, no N/V/D. Denies peripheral edema, no orthopnea. Parathyroidectomy 4 months ago, had subsequent calcium problems but calcium was normal 2 days ago.    Assessment/Plan  Acute on chronic respiratory failure initially thought to be due to COPD exacerbation and CAP, although I am now more suspicious that her dyspnea is due to worsening heart failure symptoms with decreasing dry weight, and poorer response to lasix.  -  Procalcitonin is negative > d/c ceftriaxone and azithromycin -  D/c prednisone -  Continue dulera and spiriva with prn albuterol -  Diuresis as below  Probable acute on chronic diastolic heart failure due to nonischemic cardiomyopathy, previously EF 20-25% but EF recovered and now with chronic diastolic heart failure.  Last ECHO concerning for right heart failure, 10/2015.   I suspect that she is cold and wet.   -  CXR suggests fluid in fissure, possible interstitial edema at the bilateral bases -  ECHO pending - -1.56 L  -  Wts likely not accurate -  Continue lasix 80mg  IV BID -  Monitor creatinine carefully -  Cardiology consult  Acute on chronic kidney disease stage 3, baseline creatinine of 1.2, 1.7 on admission and certainly not improving and may be worsening with IVF.  Mild hyperkalemia likely related to AKI + ARB -  Hold ARB -  Careful monitoring of creatinine  Diabetes mellitus type 2, last A1c 7 03/2015, CBG stable to borderline low this morning.  Tapering steroids -  D/c lantus -  Continue low dose SSI  Paroxysmal atrial fibrillation, CHADs2vasc = 6, SR with PVC on telemetry -  Continue eliquis  Crohn's disease,  stable, continue sulfasalazine  Hypothyroidism, stable, continue synthroid  CAD, chest pain free.  Continue eliquis, atorvastatin, and metoprolol, ranolazine  OSA not using CPAP   Diet:  Diabetic healthy heart, 1.2L fluid restriction Access:  PIV IVF:  off Proph:  lovenox  Code Status: full code Family Communication: patient and her son Disposition Plan: pending improvement in dyspnea, able to complete basic ADLs without severe SOB.  PT recommending SNF at this time   Consultants:  none  Procedures:  CXR  Antibiotics:  Ceftriaxone 4/7  Azithromcyin 4/7   HPI/Subjective:  Still dyspneic, breathing is about the same.  Would like to sit in recliner.  Upset that we attempted to give her lasix at 1AM.    Objective: Filed Vitals:   01/08/16 0417 01/08/16 0819 01/08/16 0908 01/08/16 0915  BP: 120/75 121/61    Pulse: 99 102    Temp: 97.6 F (36.4 C) 98.7 F (37.1 C)    TempSrc:  Oral    Resp: 17 20    Height:      Weight:      SpO2: 99% 99% 87% 95%    Intake/Output Summary (Last 24 hours) at 01/08/16 1156 Last data filed at 01/08/16 1100  Gross per 24 hour  Intake    770 ml  Output   2900 ml  Net  -2130 ml   Filed Weights   01/05/16 1604 01/07/16 2016  Weight: 115.667 kg (255 lb) 127.914 kg (282 lb)   Body mass index is 39.35 kg/(m^2).  Exam:   General:  Adult female, No acute distress, on 2L [place]  HEENT:  NCAT, MMM, difficult to discern JVP  Cardiovascular:  Tachycardic IRRR, nl S1, S2 no mrg, cold extremities, delayed capillary refill  Respiratory:  Diminished bilateral BS, rales at deep bases or obvious wheeze no increased WOB  Abdomen:   NABS, soft, NT/ND  MSK:   Normal tone and bulk, trace bilateral LEE  Neuro:  Grossly intact  Data Reviewed: Basic Metabolic Panel:  Recent Labs Lab 01/05/16 1623 01/05/16 1835 01/06/16 0609 01/07/16 0720 01/08/16 0442  NA 140  --  137 138 139  K 5.4*  --  5.6* 5.5* 4.8  CL 104  --  103 105 100*   CO2 24  --  22 22 25   GLUCOSE 138*  --  153* 143* 118*  BUN 26*  --  27* 28* 33*  CREATININE 1.70*  --  1.65* 1.86* 1.91*  CALCIUM 9.5  --  9.0 8.7* 8.8*  PHOS  --  4.5  --   --   --    Liver Function Tests:  Recent Labs Lab 01/05/16 1835  AST 29  ALT 14  ALKPHOS 113  BILITOT 1.1  PROT 6.4*  ALBUMIN 2.9*    Recent Labs Lab 01/05/16 1835  LIPASE 21   No results for input(s): AMMONIA in the last 168 hours. CBC:  Recent Labs Lab 01/05/16 1623 01/05/16 1835 01/06/16 0609 01/07/16 0720 01/08/16 0442  WBC 7.9  --  5.2 8.5 7.9  NEUTROABS  --  75.0*  --   --   --   HGB 16.0*  --  15.3* 15.8* 15.6*  HCT 51.0*  --  49.5* 49.6* 50.4*  MCV 100.0  --  100.0 101.6* 101.4*  PLT 339  --  309 356 306    Recent Results (from the past 240 hour(s))  Culture, blood (routine x 2)     Status: None (Preliminary result)   Collection Time: 01/05/16  5:45 PM  Result Value Ref Range Status   Specimen Description BLOOD LEFT ANTECUBITAL  Final   Special Requests IN PEDIATRIC BOTTLE 2CC  Final   Culture NO GROWTH 2 DAYS  Final   Report Status PENDING  Incomplete  Culture, blood (routine x 2)     Status: None (Preliminary result)   Collection Time: 01/05/16  5:55 PM  Result Value Ref Range Status   Specimen Description BLOOD LEFT FOREARM  Final   Special Requests IN PEDIATRIC BOTTLE 3CC  Final   Culture NO GROWTH 2 DAYS  Final   Report Status PENDING  Incomplete     Studies: Dg Chest Port 1 View  01/07/2016  CLINICAL DATA:  Elevated heart rate. Fatigue, shortness of breath and cough. EXAM: PORTABLE CHEST 1 VIEW COMPARISON:  01/05/2016 FINDINGS: There is a left chest wall pacer device with leads in the right atrial appendage right ventricle. Moderate cardiac enlargement. Bilateral pleural effusions are noted left greater right. Mild interstitial edema. IMPRESSION: 1. Cardiac enlargement and CHF. Electronically Signed   By: Kerby Moors M.D.   On: 01/07/2016 12:08    Scheduled  Meds: . antiseptic oral rinse  7 mL Mouth Rinse BID  . apixaban  5 mg Oral BID  . atorvastatin  80 mg Oral QHS  . folic acid  1 mg Oral Daily  . furosemide  80 mg Intravenous BID  . insulin aspart  0-9 Units Subcutaneous TID WC  . insulin glargine  10 Units Subcutaneous Daily  .  levothyroxine  75 mcg Oral QAC breakfast  . metoprolol  100 mg Oral BID  . mometasone-formoterol  2 puff Inhalation BID  . multivitamin with minerals  1 tablet Oral Daily  . pantoprazole  40 mg Oral Daily  . ranolazine  500 mg Oral BID  . sulfaSALAzine  1,000 mg Oral BID  . tiotropium  1 capsule Inhalation Daily   Continuous Infusions:    Principal Problem:   CAP (community acquired pneumonia) Active Problems:   Atrial fibrillation (HCC)   CKD (chronic kidney disease) stage 3, GFR 30-59 ml/min   Type 2 diabetes mellitus with diabetic nephropathy (HCC)   AKI (acute kidney injury) ([place])   COPD (chronic obstructive pulmonary disease) ([place])    Time spent: 30 min    Masai Kidd, [place] [facility] Pager [phone]. If 7PM-7AM, please contact night-coverage at [email or web address], password [facility] 01/08/2016, 11:56 AM  LOS: 3 days

## 2016-01-08 NOTE — Progress Notes (Signed)
Echocardiogram 2D Echocardiogram has been performed.  Joelene Millin 01/08/2016, 2:20 PM

## 2016-01-08 NOTE — Evaluation (Signed)
Occupational Therapy Evaluation Patient Details Name: [name] MRN: [id number] DOB: May 02, 1941 Today's Date: 01/08/2016    History of Present Illness 75 year old female PMH of Chronic combined systolic and diastolic CHF, morbid obesity, HLD, tachybradycardia syndrome status post PPM, COPD, depression, GERD, hypothyroid, A. fib, DM, CAD, OSA-not on CPAP, chronic respiratory failure on home oxygen 2 L/m at bedtime, recent parathyroidectomy, recent hospitalization 10/10/15-10/13/15 for acute on chronic combined CHF, A. fib with RVR and symptomatic hypocalcemia from recent parathyroidectomy,. Admitted for Acute on chronic respiratory failure possibly secondary to COPD exacerbation and CAP   Clinical Impression   Patient presenting with decreased ADL and functional mobility independence. Patient mod I PTA. Patient currently functioning at an overall min guard/assist level. Patient will benefit from acute OT to increase overall independence in the areas of ADLs, functional mobility, energy conservation education, and overall safety in order to safely discharge to venue listed below.     Follow Up Recommendations  SNF;Supervision/Assistance - 24 hour    Equipment Recommendations  Other (comment) (TBD next venue of care)    Recommendations for Other Services  None at this time    Precautions / Restrictions Precautions Precautions: Fall Precaution Comments: monitor O2 Restrictions Weight Bearing Restrictions: No    Mobility Bed Mobility Overal bed mobility: Modified Independent General bed mobility comments: Increased time and motivation/encouragement   Transfers Overall transfer level: Needs assistance Equipment used: Rolling walker (2 wheeled) Transfers: Sit to/from [facility] Sit to Stand: Min guard;Min assist Stand pivot transfers: Min guard       General transfer comment: sit to/from stand and stand pivot transfers to/from [facility] from EOB. Pt required  encouragmenet and motivation to perform transfers. Patient's 02 sats remained greater than 90% entire session on 2L/min supplemental 02.     Balance Overall balance assessment: Needs assistance Sitting-balance support: No upper extremity supported;Feet supported Sitting balance-Leahy Scale: Good     Standing balance support: During functional activity;Single extremity supported Standing balance-Leahy Scale: Fair Standing balance comment: Pt performed sit to/from stand for peri cleansing, holding onto BSC    ADL Overall ADL's : Needs assistance/impaired Eating/Feeding: Set up;Sitting   Grooming: Set up;Sitting   Upper Body Bathing: Set up;Sitting   Lower Body Bathing: Minimal assistance;Sit to/from stand   Upper Body Dressing : Set up;Sitting   Lower Body Dressing: Minimal assistance;Sit to/from stand   Toilet Transfer: Min guard;BSC;Stand-pivot   Toileting- Water quality scientist and Hygiene: Minimal assistance;Sit to/from stand Toileting - Clothing Manipulation Details (indicate cue type and reason): minimal assist to steady in standing   Tub/Shower Transfer Details (indicate cue type and reason): did not occur Functional mobility during ADLs: Minimal assistance;Cueing for safety;Cueing for sequencing General ADL Comments: Pt limited by decreased strength and endurance. Started education on energy conservation.     Vision Vision Assessment?: No apparent visual deficits          Pertinent Vitals/Pain Pain Assessment: No/denies pain     Hand Dominance Right   Extremity/Trunk Assessment Upper Extremity Assessment Upper Extremity Assessment: Generalized weakness   Lower Extremity Assessment Lower Extremity Assessment: Generalized weakness       Communication Communication Communication: HOH   Cognition Arousal/Alertness: Awake/alert Behavior During Therapy: WFL for tasks assessed/performed Overall Cognitive Status: Within Functional Limits for tasks assessed              Home Living Family/patient expects to be discharged to:: Private residence Living Arrangements: Alone Available Help at Discharge: Family;Available PRN/intermittently Type of Home: House Home Access:  Stairs to enter [employer] of Steps: 1 Entrance Stairs-Rails: None Home Layout: One level     Bathroom Shower/Tub: Occupational psychologist: Handicapped height Bathroom Accessibility: Yes   Home Equipment: Environmental consultant - 2 wheels;Walker - 4 wheels;Tub bench;Shower seat;Grab bars - toilet;Wheelchair - manual;Hospital bed   Prior Functioning/Environment Level of Independence: Independent with assistive device(s)  Comments: States she has been having difficulty with endurance while bathing herself. Rollator for mobility.    OT Diagnosis: Generalized weakness   OT Problem List: Decreased strength;Decreased activity tolerance;Impaired balance (sitting and/or standing);Decreased safety awareness;Decreased knowledge of use of DME or AE;Decreased knowledge of precautions;Cardiopulmonary status limiting activity   OT Treatment/Interventions: Self-care/ADL training;Therapeutic exercise;DME and/or AE instruction;Energy conservation;Therapeutic activities;Patient/family education;Balance training    OT Goals(Current goals can be found in the care plan section) Acute Rehab OT Goals Patient Stated Goal: get some sleep OT Goal Formulation: With patient Time For Goal Achievement: 01/15/16 Potential to Achieve Goals: Good ADL Goals Pt Will Perform Grooming: with set-up;standing (for at least 5 minutes with no rest breaks at sink) Pt Will Transfer to Toilet: with modified independence;bedside commode;stand pivot transfer Additional ADL Goal #1: Pt will be educated on energy conservation techniques and pt will independently verbalize at least 3 techniques during ADL   OT Frequency: Min 2X/week   Barriers to D/C: Decreased caregiver support   End of Session Equipment  Utilized During Treatment: Oxygen  Activity Tolerance: Patient tolerated treatment well Patient left: in bed;with call bell/phone within reach   Time: [id number] OT Time Calculation (min): 16 min Charges:  OT General Charges $OT Visit: 1 Procedure OT Evaluation $OT Eval Moderate Complexity: 1 Procedure Chrys Racer , MS, OTR/L, CLT Pager: [phone]  01/08/2016, 3:46 PM

## 2016-01-09 ENCOUNTER — Encounter (HOSPITAL_COMMUNITY)
Admission: EM | Disposition: A | Discharge status: Skilled Nursing Facility | Payer: Self-pay | Source: Home / Self Care | Attending: Internal Medicine

## 2016-01-09 DIAGNOSIS — I272 Pulmonary hypertension, unspecified: Secondary | ICD-10-CM

## 2016-01-09 DIAGNOSIS — I50810 Right heart failure, unspecified: Secondary | ICD-10-CM

## 2016-01-09 DIAGNOSIS — I5033 Acute on chronic diastolic (congestive) heart failure: Secondary | ICD-10-CM

## 2016-01-09 DIAGNOSIS — I509 Heart failure, unspecified: Secondary | ICD-10-CM

## 2016-01-09 HISTORY — PX: CARDIAC CATHETERIZATION: SHX172

## 2016-01-09 LAB — CBC
HEMATOCRIT: 47.6 % — AB (ref 36.0–46.0)
Hemoglobin: 15.3 g/dL — ABNORMAL HIGH (ref 12.0–15.0)
MCH: 31.8 pg (ref 26.0–34.0)
MCHC: 32.1 g/dL (ref 30.0–36.0)
MCV: 99 fL (ref 78.0–100.0)
PLATELETS: 329 10*3/uL (ref 150–400)
RBC: 4.81 MIL/uL (ref 3.87–5.11)
RDW: 15.7 % — AB (ref 11.5–15.5)
WBC: 8.5 10*3/uL (ref 4.0–10.5)

## 2016-01-09 LAB — POCT I-STAT 3, VENOUS BLOOD GAS (G3P V)
ACID-BASE EXCESS: 6 mmol/L — AB (ref 0.0–2.0)
ACID-BASE EXCESS: 8 mmol/L — AB (ref 0.0–2.0)
BICARBONATE: 34.1 meq/L — AB (ref 20.0–24.0)
Bicarbonate: 37.5 mEq/L — ABNORMAL HIGH (ref 20.0–24.0)
O2 SAT: 51 %
O2 Saturation: 54 %
PH VEN: 7.359 — AB (ref 7.250–7.300)
TCO2: 36 mmol/L (ref 0–100)
TCO2: 40 mmol/L (ref 0–100)
pCO2, Ven: 60.5 mmHg — ABNORMAL HIGH (ref 45.0–50.0)
pCO2, Ven: 67.6 mmHg — ABNORMAL HIGH (ref 45.0–50.0)
pH, Ven: 7.352 — ABNORMAL HIGH (ref 7.250–7.300)
pO2, Ven: 30 mmHg — ABNORMAL LOW (ref 31.0–45.0)
pO2, Ven: 31 mmHg (ref 31.0–45.0)

## 2016-01-09 LAB — GLUCOSE, CAPILLARY
GLUCOSE-CAPILLARY: 116 mg/dL — AB (ref 65–99)
GLUCOSE-CAPILLARY: 116 mg/dL — AB (ref 65–99)
Glucose-Capillary: 152 mg/dL — ABNORMAL HIGH (ref 65–99)
Glucose-Capillary: 200 mg/dL — ABNORMAL HIGH (ref 65–99)

## 2016-01-09 LAB — BASIC METABOLIC PANEL
ANION GAP: 17 — AB (ref 5–15)
BUN: 34 mg/dL — AB (ref 6–20)
CALCIUM: 8.5 mg/dL — AB (ref 8.9–10.3)
CO2: 27 mmol/L (ref 22–32)
Chloride: 93 mmol/L — ABNORMAL LOW (ref 101–111)
Creatinine, Ser: 1.88 mg/dL — ABNORMAL HIGH (ref 0.44–1.00)
GFR calc Af Amer: 29 mL/min — ABNORMAL LOW (ref >60)
GFR, EST NON AFRICAN AMERICAN: 25 mL/min — AB (ref >60)
GLUCOSE: 112 mg/dL — AB (ref 65–99)
Potassium: 3.4 mmol/L — ABNORMAL LOW (ref 3.5–5.1)
Sodium: 137 mmol/L (ref 135–145)

## 2016-01-09 LAB — PROCALCITONIN: Procalcitonin: <0.1 ng/mL

## 2016-01-09 LAB — ECHOCARDIOGRAM COMPLETE
Height: 71 in
WEIGHTICAEL: 4512 [oz_av]

## 2016-01-09 SURGERY — RIGHT HEART CATH

## 2016-01-09 MED ORDER — SODIUM CHLORIDE 0.9% FLUSH
3.0000 mL | INTRAVENOUS | Status: DC | PRN
  Administered 2016-01-11: 3 mL via INTRAVENOUS
  Filled 2016-01-09: qty 3

## 2016-01-09 MED ORDER — SODIUM CHLORIDE 0.9 % IV SOLN: 250.0000 mL | INTRAVENOUS | Status: DC | PRN

## 2016-01-09 MED ORDER — HEPARIN (PORCINE) IN NACL 2-0.9 UNIT/ML-% IJ SOLN
INTRAMUSCULAR | Status: AC
  Filled 2016-01-09: qty 500

## 2016-01-09 MED ORDER — ACETAMINOPHEN 325 MG PO TABS: 650.0000 mg | ORAL_TABLET | ORAL | Status: DC | PRN

## 2016-01-09 MED ORDER — SILDENAFIL CITRATE 20 MG PO TABS
20.0000 mg | ORAL_TABLET | Freq: Three times a day (TID) | ORAL | Status: DC
  Administered 2016-01-09 – 2016-01-12 (×9): 20 mg via ORAL
  Filled 2016-01-09 (×9): qty 1

## 2016-01-09 MED ORDER — POTASSIUM CHLORIDE CRYS ER 20 MEQ PO TBCR
40.0000 meq | EXTENDED_RELEASE_TABLET | Freq: Every day | ORAL | Status: DC
  Administered 2016-01-09: 40 meq via ORAL
  Filled 2016-01-09: qty 2

## 2016-01-09 MED ORDER — LIDOCAINE HCL (PF) 1 % IJ SOLN
INTRAMUSCULAR | Status: AC
  Filled 2016-01-09: qty 30

## 2016-01-09 MED ORDER — SODIUM CHLORIDE 0.9% FLUSH: 3.0000 mL | INTRAVENOUS | Status: DC | PRN

## 2016-01-09 MED ORDER — LIDOCAINE HCL (PF) 1 % IJ SOLN
INTRAMUSCULAR | Status: DC | PRN
  Administered 2016-01-09: 8 mL via INTRADERMAL

## 2016-01-09 MED ORDER — SODIUM CHLORIDE 0.9% FLUSH
3.0000 mL | Freq: Two times a day (BID) | INTRAVENOUS | Status: DC
  Administered 2016-01-09 – 2016-01-11 (×3): 3 mL via INTRAVENOUS

## 2016-01-09 MED ORDER — ONDANSETRON HCL 4 MG/2ML IJ SOLN: 4.0000 mg | Freq: Four times a day (QID) | INTRAMUSCULAR | Status: DC | PRN

## 2016-01-09 MED ORDER — SODIUM CHLORIDE 0.9% FLUSH
3.0000 mL | Freq: Two times a day (BID) | INTRAVENOUS | Status: DC
  Administered 2016-01-09 – 2016-01-12 (×6): 3 mL via INTRAVENOUS

## 2016-01-09 SURGICAL SUPPLY — 11 items
CATH SWAN GANZ 7F STRAIGHT (CATHETERS) ×2 IMPLANT
COVER PRB 48X5XTLSCP FOLD TPE (BAG) IMPLANT
COVER PROBE 5X48 (BAG) ×3
HOVERMATT SINGLE USE (MISCELLANEOUS) ×2 IMPLANT
KIT HEART LEFT (KITS) ×3 IMPLANT
KIT HEART RIGHT NAMIC (KITS) ×3 IMPLANT
PACK CARDIAC CATHETERIZATION (CUSTOM PROCEDURE TRAY) ×3 IMPLANT
SHEATH PINNACLE 7F 10CM (SHEATH) ×2 IMPLANT
SYR MEDRAD MARK V 150ML (SYRINGE) ×3 IMPLANT
TRANSDUCER W/STOPCOCK (MISCELLANEOUS) ×4 IMPLANT
TUBING CIL FLEX 10 FLL-RA (TUBING) ×3 IMPLANT

## 2016-01-09 NOTE — Interval H&P Note (Signed)
History and Physical Interval Note:  01/09/2016 1:51 PM  [name]  has presented today for surgery, with the diagnosis of hf  The various methods of treatment have been discussed with the patient and family. After consideration of risks, benefits and other options for treatment, the patient has consented to  Procedure(s): Right Heart Cath (N/A) as a surgical intervention .  The patient's history has been reviewed, patient examined, no change in status, stable for surgery.  I have reviewed the patient's chart and labs.  Questions were answered to the patient's satisfaction.     Shem Plemmons, Quillian Quince

## 2016-01-09 NOTE — Progress Notes (Signed)
[facility] PROGRESS NOTE  [name] [id number] DOB: 1941/04/08 DOA: 01/05/2016 PCP: [name], MD  Brief Summary  [name] is a 75 y.o. female with h/o COPD on 2L home O2 at night, DM2. Patient presents to the ED with c/o increasing fatigue, generalized weakness, SOB, cough. She discontinued lasix 4 days ago due to poor UOP, no N/V/D. Denies peripheral edema, no orthopnea. Parathyroidectomy 4 months ago, had subsequent calcium problems but calcium was normal 2 days ago.    Assessment/Plan  Acute on chronic respiratory failure initially thought to be due to COPD exacerbation and CAP, however, more likely to be due to pulmonary arterial hypertension and RV failure.  She is cold and wet on exam. -  Continue dulera and spiriva with prn albuterol -  Started sildenafil 20mg  TID on 4/11 -  CXR suggests fluid in fissure, possible interstitial edema at the bilateral bases -  ECHO demonstrates massively enlarged RV - -3.69 L  -  Wt:  115kg -  Continue lasix 80mg  IV BID -  Monitor creatinine carefully -  Cardiology assistance greatly appreciated  Acute on chronic kidney disease stage 3, baseline creatinine of 1.2, 1.7 on admission and certainly not improving and may be worsening with IVF.  Mild hyperkalemia likely related to AKI + ARB -  Hold ARB -  Careful monitoring of creatinine  Diabetes mellitus type 2, last A1c 7 03/2015, CBG stable to borderline low this morning.  Tapering steroids -  Continue low dose SSI  Paroxysmal atrial fibrillation, CHADs2vasc = 6, SR with PVC on telemetry -  Continue eliquis  Crohn's disease, stable, continue sulfasalazine  Hypothyroidism, stable, continue synthroid  CAD, chest pain free.  Continue eliquis, atorvastatin, and metoprolol, ranolazine  OSA not using CPAP  Hypokalemia due to diuresis.  Start oral potassium supplementation  Diet:  Diabetic healthy heart, 1.2L fluid restriction Access:  PIV IVF:  off Proph:   lovenox  Code Status: full code Family Communication: patient alone Disposition Plan: pending improvement in dyspnea, able to complete basic ADLs without severe SOB.  PT recommending SNF at this time   Consultants:  none  Procedures:  CXR  Antibiotics:  Ceftriaxone 4/7 > 4/10  Azithromcyin 4/7 > 4/10  HPI/Subjective:  States breathing is better because of oxygen.  Denies bloating, changes in appetite, lower extremity edema.    Objective: Filed Vitals:   01/09/16 1419 01/09/16 1424 01/09/16 1429 01/09/16 1442  BP: 132/86   113/70  Pulse: 98 0 0 100  Temp:    97.8 F (36.6 C)  TempSrc:    Oral  Resp: 10 0 0 19  Height:      Weight:      SpO2: 98% 0% 0% 99%    Intake/Output Summary (Last 24 hours) at 01/09/16 1502 Last data filed at 01/09/16 1443  Gross per 24 hour  Intake    240 ml  Output   3600 ml  Net  -3360 ml   Filed Weights   01/05/16 1604 01/07/16 2016 01/09/16 0407  Weight: 115.667 kg (255 lb) 127.914 kg (282 lb) 115.4 kg (254 lb 6.6 oz)   Body mass index is 35.5 kg/(m^2).  Exam:   General:  Adult female, No acute distress, on 2L [place]  HEENT:  NCAT, MMM, difficult to discern JVP  Cardiovascular:  IRRR, nl S1, S2 no mrg, cold extremities, delayed capillary refill  Respiratory:  Diminished bilateral BS, rales at bases, no obvious wheeze no increased WOB  Abdomen:  NABS, soft, NT/ND  MSK:   Normal tone and bulk, trace bilateral LEE  Neuro:  Grossly intact  Data Reviewed: Basic Metabolic Panel:  Recent Labs Lab 01/05/16 1623 01/05/16 1835 01/06/16 0609 01/07/16 0720 01/08/16 0442 01/09/16 0437  NA 140  --  137 138 139 137  K 5.4*  --  5.6* 5.5* 4.8 3.4*  CL 104  --  103 105 100* 93*  CO2 24  --  22 22 25 27   GLUCOSE 138*  --  153* 143* 118* 112*  BUN 26*  --  27* 28* 33* 34*  CREATININE 1.70*  --  1.65* 1.86* 1.91* 1.88*  CALCIUM 9.5  --  9.0 8.7* 8.8* 8.5*  PHOS  --  4.5  --   --   --   --    Liver Function Tests:  Recent  Labs Lab 01/05/16 1835  AST 29  ALT 14  ALKPHOS 113  BILITOT 1.1  PROT 6.4*  ALBUMIN 2.9*    Recent Labs Lab 01/05/16 1835  LIPASE 21   No results for input(s): AMMONIA in the last 168 hours. CBC:  Recent Labs Lab 01/05/16 1623 01/05/16 1835 01/06/16 0609 01/07/16 0720 01/08/16 0442 01/09/16 0437  WBC 7.9  --  5.2 8.5 7.9 8.5  NEUTROABS  --  75.0*  --   --   --   --   HGB 16.0*  --  15.3* 15.8* 15.6* 15.3*  HCT 51.0*  --  49.5* 49.6* 50.4* 47.6*  MCV 100.0  --  100.0 101.6* 101.4* 99.0  PLT 339  --  309 356 306 329    Recent Results (from the past 240 hour(s))  Culture, blood (routine x 2)     Status: None (Preliminary result)   Collection Time: 01/05/16  5:45 PM  Result Value Ref Range Status   Specimen Description BLOOD LEFT ANTECUBITAL  Final   Special Requests IN PEDIATRIC BOTTLE 2CC  Final   Culture NO GROWTH 4 DAYS  Final   Report Status PENDING  Incomplete  Culture, blood (routine x 2)     Status: None (Preliminary result)   Collection Time: 01/05/16  5:55 PM  Result Value Ref Range Status   Specimen Description BLOOD LEFT FOREARM  Final   Special Requests IN PEDIATRIC BOTTLE 3CC  Final   Culture NO GROWTH 4 DAYS  Final   Report Status PENDING  Incomplete     Studies: No results found.  Scheduled Meds: . antiseptic oral rinse  7 mL Mouth Rinse BID  . apixaban  5 mg Oral BID  . atorvastatin  80 mg Oral QHS  . folic acid  1 mg Oral Daily  . furosemide  80 mg Intravenous BID  . insulin aspart  0-9 Units Subcutaneous TID WC  . levothyroxine  75 mcg Oral QAC breakfast  . metoprolol  100 mg Oral BID  . mometasone-formoterol  2 puff Inhalation BID  . multivitamin with minerals  1 tablet Oral Daily  . pantoprazole  40 mg Oral Daily  . potassium chloride  40 mEq Oral Daily  . ranolazine  500 mg Oral BID  . sildenafil  20 mg Oral TID  . sodium chloride flush  3 mL Intravenous Q12H  . sodium chloride flush  3 mL Intravenous Q12H  . sulfaSALAzine   1,000 mg Oral BID  . tiotropium  1 capsule Inhalation Daily   Continuous Infusions:    Principal Problem:   CAP (community acquired pneumonia) Active Problems:  Atrial fibrillation (HCC)   CKD (chronic kidney disease) stage 3, GFR 30-59 ml/min   Type 2 diabetes mellitus with diabetic nephropathy (HCC)   AKI (acute kidney injury) ([place])   COPD (chronic obstructive pulmonary disease) ([place])   RVF (right ventricular failure) ([place])   Pulmonary hypertension ([place]), RHC on 01/09/2016    Time spent: 30 min    [name], [facility] Pager [phone]. If 7PM-7AM, please contact night-coverage at [email or web address], password [facility] 01/09/2016, 3:02 PM  LOS: 4 days

## 2016-01-09 NOTE — H&P (View-Only) (Signed)
Advanced Heart Failure Rounding Note  Primary Physician [name], MD Primary Cardiologist: Dr. Tomma Lightning. Burt Knack Reason for Consultation: CHF  Subjective:    [name] is a 75 y.o. female with history of chronic combined systolic/diastolic HF, morbid obesity, HLD, tachy/brady s/p PPM, COPD, GERD, A fib, DM, CAD, and COPD on home 02. She had LHC in September 2013 which showed severe stenosis of her LAD and normal LV function.  Presented to the ED on 01/15/16 with SOB, weakness, and cough x 1 week. Had stopped lasix 4 days prior to admission with decreased urine output. Now being treated for COPD exacerbation and CAP.   Echo 01/08/16 LVEF 40-45%. RV read as "mildly dilated" and no function evaluation. On review it is severely dilated with mod/severely reduced function.   Previous Echo in 10/2015 with No EF read but RV moderately dilated with mod/severely reduced function.   She states she is currently only SOB with activity, including changing clothes, bathing, and sometimes shifting around in bed.  She denies lightheadedness, dizziness, or CP. She states everyone has commented on how cold her legs are. Drinks ~ 8 glasses of fluid daily. Tries to watch salt, but doesn't really watch what she eats.    Out 4.4 L yesterday but very little input recorded.  Weights innaccurate. Seems to be in mid 250s.   Objective:   Weight Range: 254 lb 6.6 oz (115.4 kg) Body mass index is 35.5 kg/(m^2).   Vital Signs:   Temp:  [97.5 F (36.4 C)-98.3 F (36.8 C)] 97.5 F (36.4 C) (04/11 0750) Pulse Rate:  [80-100] 80 (04/11 0750) Resp:  [18-20] 20 (04/11 0750) BP: (96-134)/(59-95) 106/71 mmHg (04/11 0750) SpO2:  [87 %-99 %] 97 % (04/11 0853) FiO2 (%):  [28 %] 28 % (04/11 0853) Weight:  [254 lb 6.6 oz (115.4 kg)] 254 lb 6.6 oz (115.4 kg) (04/11 0407) Last BM Date: 01/08/16  Weight change: Filed Weights   01/05/16 1604 01/07/16 2016 01/09/16 0407  Weight: 255 lb (115.667 kg) 282 lb  (127.914 kg) 254 lb 6.6 oz (115.4 kg)    Intake/Output:   Intake/Output Summary (Last 24 hours) at 01/09/16 0905 Last data filed at 01/09/16 0751  Gross per 24 hour  Intake    240 ml  Output   3800 ml  Net  -3560 ml     Physical Exam: General:  Elderly appearing HEENT: normal Neck: supple. JVP difficult to assess due to pt girth. Carotids 2+ bilat; no bruits. No lymphadenopathy or thyromegaly noted. Cor: PMI nondisplaced. RRR. No M/G/R appreciated Lungs: Basilar crackles, scattered rhonchi Abdomen: morbidly obese, soft, NT, ND, no HSM. No bruits or masses. +BS  Extremities: no clubbing, rash. Mildly cyanotic, very cold to the touch. Trace ankle edema Neuro: alert & orientedx3, cranial nerves grossly intact. moves all 4 extremities w/o difficulty. Affect pleasant   Telemetry: NSR 90s  Labs: CBC  Recent Labs  01/08/16 0442 01/09/16 0437  WBC 7.9 8.5  HGB 15.6* 15.3*  HCT 50.4* 47.6*  MCV 101.4* 99.0  PLT 306 [id number]   Basic Metabolic Panel  Recent Labs  01/08/16 0442 01/09/16 0437  NA 139 137  K 4.8 3.4*  CL 100* 93*  CO2 25 27  GLUCOSE 118* 112*  BUN 33* 34*  CREATININE 1.91* 1.88*  CALCIUM 8.8* 8.5*   Liver Function Tests No results for input(s): AST, ALT, ALKPHOS, BILITOT, PROT, ALBUMIN in the last 72 hours. No results for input(s): LIPASE, AMYLASE in the last 72  hours. Cardiac Enzymes No results for input(s): CKTOTAL, CKMB, CKMBINDEX, TROPONINI in the last 72 hours.  BNP: BNP (last 3 results)  Recent Labs  10/27/15 1317 01/05/16 1745 01/08/16 0442  BNP 1318.1* 844.9* 790.0*    ProBNP (last 3 results) No results for input(s): PROBNP in the last 8760 hours.   D-Dimer No results for input(s): DDIMER in the last 72 hours. Hemoglobin A1C No results for input(s): HGBA1C in the last 72 hours. Fasting Lipid Panel No results for input(s): CHOL, HDL, LDLCALC, TRIG, CHOLHDL, LDLDIRECT in the last 72 hours. Thyroid Function Tests No results for  input(s): TSH, T4TOTAL, T3FREE, THYROIDAB in the last 72 hours.  Invalid input(s): FREET3  Other results:     Imaging/Studies:  Dg Chest Port 1 View  01/07/2016  CLINICAL DATA:  Elevated heart rate. Fatigue, shortness of breath and cough. EXAM: PORTABLE CHEST 1 VIEW COMPARISON:  01/05/2016 FINDINGS: There is a left chest wall pacer device with leads in the right atrial appendage right ventricle. Moderate cardiac enlargement. Bilateral pleural effusions are noted left greater right. Mild interstitial edema. IMPRESSION: 1. Cardiac enlargement and CHF. Electronically Signed   By: Kerby Moors M.D.   On: 01/07/2016 12:08     Latest Echo  Latest Cath   Medications:     Scheduled Medications: . antiseptic oral rinse  7 mL Mouth Rinse BID  . apixaban  5 mg Oral BID  . atorvastatin  80 mg Oral QHS  . folic acid  1 mg Oral Daily  . furosemide  80 mg Intravenous BID  . insulin aspart  0-9 Units Subcutaneous TID WC  . levothyroxine  75 mcg Oral QAC breakfast  . metoprolol  100 mg Oral BID  . mometasone-formoterol  2 puff Inhalation BID  . multivitamin with minerals  1 tablet Oral Daily  . pantoprazole  40 mg Oral Daily  . ranolazine  500 mg Oral BID  . sulfaSALAzine  1,000 mg Oral BID  . tiotropium  1 capsule Inhalation Daily     Infusions:     PRN Medications:  albuterol, diphenhydrAMINE, metoprolol   Assessment   1. Combined systolic diastolic CHF - Echo [id number] LVEF 40-45% RV severely dilated and reduced. 2. 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 Aflutter 4. CAD 5. DM 6. COPD on chronic 02 2L at night.  7. Chronic anticoagulation on Eliquis 8. Deconditioning 9. Morbid obesity  Plan    She has significant dyspnea on exertion consistent with her marked RV dysfunction on Echo.   Will make NPO and proceed with RHC this afternoon.  Volume status looks OK but difficult to assess with patient size.  Will re-address diuretics s/p cath.   Likely that a large  part of her symptoms would improve with weight loss, but very deconditioned. Very likely also has component of OSA/OHS. PT recommends SNF.  Length of Stay: 4   Shirley Friar PA-C 01/09/2016, 9:05 AM  Advanced Heart Failure Team Pager [phone] (M-F; 7a - 4p)  Please contact [facility] for night-coverage after hours (4p -7a ) and weekends on [email or web address]   Patient seen and examined with [name], PA-C. We discussed all aspects of the encounter. I agree with the assessment and plan as stated above.   Echo and  History reviewed in detail. She appears to have end-stage RHF with severe RV dilation and hypokinesis. I suspect this is related to chronic hypoxia and OHS. She may have low output. Will proceed with RHC today to assess pulmonary  pressures and cardiac output. Deperately needs to lose weight. Continue Eliquis for chronic AF. No role for repeat coronary angiography at this time.   Needs ABG and sleep study.   D/w Dr. Sheran Fava.    Angelmarie Ponzo,MD 11:08 AM

## 2016-01-09 NOTE — Progress Notes (Addendum)
Advanced Heart Failure Rounding Note  Primary Physician [name], MD Primary Cardiologist: Dr. Tomma Lightning. Burt Knack Reason for Consultation: CHF  Subjective:    [name] is a 75 y.o. female with history of chronic combined systolic/diastolic HF, morbid obesity, HLD, tachy/brady s/p PPM, COPD, GERD, A fib, DM, CAD, and COPD on home 02. She had LHC in September 2013 which showed severe stenosis of her LAD and normal LV function.  Presented to the ED on 01/15/16 with SOB, weakness, and cough x 1 week. Had stopped lasix 4 days prior to admission with decreased urine output. Now being treated for COPD exacerbation and CAP.   Echo 01/08/16 LVEF 40-45%. RV read as "mildly dilated" and no function evaluation. On review it is severely dilated with mod/severely reduced function.   Previous Echo in 10/2015 with No EF read but RV moderately dilated with mod/severely reduced function.   She states she is currently only SOB with activity, including changing clothes, bathing, and sometimes shifting around in bed.  She denies lightheadedness, dizziness, or CP. She states everyone has commented on how cold her legs are. Drinks ~ 8 glasses of fluid daily. Tries to watch salt, but doesn't really watch what she eats.    Out 4.4 L yesterday but very little input recorded.  Weights innaccurate. Seems to be in mid 250s.   Objective:   Weight Range: 254 lb 6.6 oz (115.4 kg) Body mass index is 35.5 kg/(m^2).   Vital Signs:   Temp:  [97.5 F (36.4 C)-98.3 F (36.8 C)] 97.5 F (36.4 C) (04/11 0750) Pulse Rate:  [80-100] 80 (04/11 0750) Resp:  [18-20] 20 (04/11 0750) BP: (96-134)/(59-95) 106/71 mmHg (04/11 0750) SpO2:  [87 %-99 %] 97 % (04/11 0853) FiO2 (%):  [28 %] 28 % (04/11 0853) Weight:  [254 lb 6.6 oz (115.4 kg)] 254 lb 6.6 oz (115.4 kg) (04/11 0407) Last BM Date: 01/08/16  Weight change: Filed Weights   01/05/16 1604 01/07/16 2016 01/09/16 0407  Weight: 255 lb (115.667 kg) 282 lb  (127.914 kg) 254 lb 6.6 oz (115.4 kg)    Intake/Output:   Intake/Output Summary (Last 24 hours) at 01/09/16 0905 Last data filed at 01/09/16 0751  Gross per 24 hour  Intake    240 ml  Output   3800 ml  Net  -3560 ml     Physical Exam: General:  Elderly appearing HEENT: normal Neck: supple. JVP difficult to assess due to pt girth. Carotids 2+ bilat; no bruits. No lymphadenopathy or thyromegaly noted. Cor: PMI nondisplaced. RRR. No M/G/R appreciated Lungs: Basilar crackles, scattered rhonchi Abdomen: morbidly obese, soft, NT, ND, no HSM. No bruits or masses. +BS  Extremities: no clubbing, rash. Mildly cyanotic, very cold to the touch. Trace ankle edema Neuro: alert & orientedx3, cranial nerves grossly intact. moves all 4 extremities w/o difficulty. Affect pleasant   Telemetry: NSR 90s  Labs: CBC  Recent Labs  01/08/16 0442 01/09/16 0437  WBC 7.9 8.5  HGB 15.6* 15.3*  HCT 50.4* 47.6*  MCV 101.4* 99.0  PLT 306 Q000111Q   Basic Metabolic Panel  Recent Labs  01/08/16 0442 01/09/16 0437  NA 139 137  K 4.8 3.4*  CL 100* 93*  CO2 25 27  GLUCOSE 118* 112*  BUN 33* 34*  CREATININE 1.91* 1.88*  CALCIUM 8.8* 8.5*   Liver Function Tests No results for input(s): AST, ALT, ALKPHOS, BILITOT, PROT, ALBUMIN in the last 72 hours. No results for input(s): LIPASE, AMYLASE in the last 72  hours. Cardiac Enzymes No results for input(s): CKTOTAL, CKMB, CKMBINDEX, TROPONINI in the last 72 hours.  BNP: BNP (last 3 results)  Recent Labs  10/27/15 1317 01/05/16 1745 01/08/16 0442  BNP 1318.1* 844.9* 790.0*    ProBNP (last 3 results) No results for input(s): PROBNP in the last 8760 hours.   D-Dimer No results for input(s): DDIMER in the last 72 hours. Hemoglobin A1C No results for input(s): HGBA1C in the last 72 hours. Fasting Lipid Panel No results for input(s): CHOL, HDL, LDLCALC, TRIG, CHOLHDL, LDLDIRECT in the last 72 hours. Thyroid Function Tests No results for  input(s): TSH, T4TOTAL, T3FREE, THYROIDAB in the last 72 hours.  Invalid input(s): FREET3  Other results:     Imaging/Studies:  Dg Chest Port 1 View  01/07/2016  CLINICAL DATA:  Elevated heart rate. Fatigue, shortness of breath and cough. EXAM: PORTABLE CHEST 1 VIEW COMPARISON:  01/05/2016 FINDINGS: There is a left chest wall pacer device with leads in the right atrial appendage right ventricle. Moderate cardiac enlargement. Bilateral pleural effusions are noted left greater right. Mild interstitial edema. IMPRESSION: 1. Cardiac enlargement and CHF. Electronically Signed   By: Kerby Moors M.D.   On: 01/07/2016 12:08     Latest Echo  Latest Cath   Medications:     Scheduled Medications: . antiseptic oral rinse  7 mL Mouth Rinse BID  . apixaban  5 mg Oral BID  . atorvastatin  80 mg Oral QHS  . folic acid  1 mg Oral Daily  . furosemide  80 mg Intravenous BID  . insulin aspart  0-9 Units Subcutaneous TID WC  . levothyroxine  75 mcg Oral QAC breakfast  . metoprolol  100 mg Oral BID  . mometasone-formoterol  2 puff Inhalation BID  . multivitamin with minerals  1 tablet Oral Daily  . pantoprazole  40 mg Oral Daily  . ranolazine  500 mg Oral BID  . sulfaSALAzine  1,000 mg Oral BID  . tiotropium  1 capsule Inhalation Daily     Infusions:     PRN Medications:  albuterol, diphenhydrAMINE, metoprolol   Assessment   1. Combined systolic diastolic CHF - Echo [id number] LVEF 40-45% RV severely dilated and reduced. 2. 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 Aflutter 4. CAD 5. DM 6. COPD on chronic 02 2L at night.  7. Chronic anticoagulation on Eliquis 8. Deconditioning 9. Morbid obesity  Plan    She has significant dyspnea on exertion consistent with her marked RV dysfunction on Echo.   Will make NPO and proceed with RHC this afternoon.  Volume status looks OK but difficult to assess with patient size.  Will re-address diuretics s/p cath.   Likely that a large  part of her symptoms would improve with weight loss, but very deconditioned. Very likely also has component of OSA/OHS. PT recommends SNF.  Length of Stay: 4   Shirley Friar PA-C 01/09/2016, 9:05 AM  Advanced Heart Failure Team Pager [phone] (M-F; 7a - 4p)  Please contact [facility] for night-coverage after hours (4p -7a ) and weekends on [email or web address]   Patient seen and examined with [name], PA-C. We discussed all aspects of the encounter. I agree with the assessment and plan as stated above.   Echo and  History reviewed in detail. She appears to have end-stage RHF with severe RV dilation and hypokinesis. I suspect this is related to chronic hypoxia and OHS. She may have low output. Will proceed with RHC today to assess pulmonary  pressures and cardiac output. Deperately needs to lose weight. Continue Eliquis for chronic AF. No role for repeat coronary angiography at this time.   Needs ABG and sleep study.   D/w Dr. Sheran Fava.    Bensimhon, Daniel,MD 11:08 AM

## 2016-01-09 NOTE — CV Procedure (Signed)
RHC   Findings:  RA = 15 RV = 52/8/12 PA = 54/28 (39) PCW = 13 Fick cardiac output/index = 3.5/1.5 Thermo CO/CI = 4.4/1.9 PVR = 5.9 WU Ao sat = 95% PA sat = 54%, 51%  Assessment:  1. 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 Normal left sided filling pressures  Plan/Discussion:  She has prominent RV failure in setting of PAH. Suspect WHO Group I and III disease. Continue gentle diuresis as BP and renal function tolerate. Start sildenafil 20 tid. Needs weight loss and sleep study.  Bensimhon, Daniel,MD 2:18 PM

## 2016-01-10 ENCOUNTER — Encounter (HOSPITAL_COMMUNITY): Payer: Self-pay | Admitting: Internal Medicine

## 2016-01-10 DIAGNOSIS — I482 Chronic atrial fibrillation, unspecified: Secondary | ICD-10-CM | POA: Insufficient documentation

## 2016-01-10 DIAGNOSIS — I272 Other secondary pulmonary hypertension: Secondary | ICD-10-CM

## 2016-01-10 DIAGNOSIS — J449 Chronic obstructive pulmonary disease, unspecified: Secondary | ICD-10-CM

## 2016-01-10 LAB — CULTURE, BLOOD (ROUTINE X 2)
Culture: NO GROWTH
Culture: NO GROWTH

## 2016-01-10 LAB — GLUCOSE, CAPILLARY
GLUCOSE-CAPILLARY: 208 mg/dL — AB (ref 65–99)
GLUCOSE-CAPILLARY: 202 mg/dL — AB (ref 65–99)
Glucose-Capillary: 129 mg/dL — ABNORMAL HIGH (ref 65–99)
Glucose-Capillary: 178 mg/dL — ABNORMAL HIGH (ref 65–99)

## 2016-01-10 LAB — BASIC METABOLIC PANEL
Anion gap: 12 (ref 5–15)
BUN: 30 mg/dL — ABNORMAL HIGH (ref 6–20)
CALCIUM: 8.3 mg/dL — AB (ref 8.9–10.3)
CO2: 38 mmol/L — AB (ref 22–32)
CREATININE: 1.65 mg/dL — AB (ref 0.44–1.00)
Chloride: 91 mmol/L — ABNORMAL LOW (ref 101–111)
GFR calc non Af Amer: 29 mL/min — ABNORMAL LOW (ref >60)
GFR, EST AFRICAN AMERICAN: 34 mL/min — AB (ref >60)
GLUCOSE: 115 mg/dL — AB (ref 65–99)
Potassium: 3.5 mmol/L (ref 3.5–5.1)
Sodium: 141 mmol/L (ref 135–145)

## 2016-01-10 MED ORDER — CLOTRIMAZOLE 10 MG MT TROC
10.0000 mg | Freq: Every day | OROMUCOSAL | Status: DC
  Administered 2016-01-10 – 2016-01-12 (×10): 10 mg via ORAL
  Filled 2016-01-10 (×14): qty 1

## 2016-01-10 MED ORDER — POTASSIUM CHLORIDE CRYS ER 20 MEQ PO TBCR
40.0000 meq | EXTENDED_RELEASE_TABLET | Freq: Every day | ORAL | Status: DC
  Administered 2016-01-10 – 2016-01-12 (×3): 40 meq via ORAL
  Filled 2016-01-10 (×2): qty 2

## 2016-01-10 MED ORDER — COLCHICINE 0.6 MG PO TABS
0.3000 mg | ORAL_TABLET | Freq: Every day | ORAL | Status: DC
  Administered 2016-01-11 – 2016-01-12 (×2): 0.3 mg via ORAL
  Filled 2016-01-10 (×2): qty 1

## 2016-01-10 MED ORDER — POTASSIUM CHLORIDE CRYS ER 20 MEQ PO TBCR
40.0000 meq | EXTENDED_RELEASE_TABLET | Freq: Two times a day (BID) | ORAL | Status: DC
  Filled 2016-01-10: qty 2

## 2016-01-10 MED ORDER — COLCHICINE 0.6 MG PO TABS
0.6000 mg | ORAL_TABLET | Freq: Two times a day (BID) | ORAL | Status: DC
  Administered 2016-01-10: 0.6 mg via ORAL
  Filled 2016-01-10: qty 1

## 2016-01-10 MED ORDER — COLCHICINE 0.6 MG PO TABS: 0.6000 mg | ORAL_TABLET | Freq: Every day | ORAL | Status: DC

## 2016-01-10 MED ORDER — TORSEMIDE 20 MG PO TABS
40.0000 mg | ORAL_TABLET | Freq: Two times a day (BID) | ORAL | Status: DC
  Administered 2016-01-10 – 2016-01-12 (×4): 40 mg via ORAL
  Filled 2016-01-10 (×4): qty 2

## 2016-01-10 MED FILL — Heparin Sodium (Porcine) 2 Unit/ML in Sodium Chloride 0.9%: INTRAMUSCULAR | Qty: 500 | Status: AC

## 2016-01-10 NOTE — Progress Notes (Signed)
PT Cancellation Note  Patient Details Name: [name] MRN: [id number] DOB: 04/09/1941   Cancelled Treatment:    Reason Eval/Treat Not Completed: Fatigue/lethargy limiting ability to participate States she is very tired after sitting up in a chair for most of the morning. Requests PT be held today as she is not feeling well. Will follow-up and continue to progress as tolerated. Encouraged to get OOB again today after she rests.  Ellouise Newer 01/10/2016, 2:32 PM Camille Bal [place], [place]

## 2016-01-10 NOTE — Progress Notes (Addendum)
[facility] PROGRESS NOTE  TAALIYAH PASCIAK [id number] DOB: Aug 20, 1941 DOA: 01/05/2016 PCP: [name], MD  Brief Summary  [name] is a 75 y.o. female with h/o COPD on 2L home O2 at night, DM2. Patient presents to the ED with c/o increasing fatigue, generalized weakness, SOB, cough. She discontinued lasix 4 days ago due to poor UOP with nausea, vomiting, and bloating.  She denied peripheral edema and orthopnea. Parathyroidectomy 4 months ago, had subsequent calcium problems but calcium was normal 2 days prior to admission.  Initially, she was thought to have CAP with wheezing from COPD exacerbation, however, she had no improvement with steroids and antibiotics.  Upon further review, she was determined to have probable heart failure and started on diuretics.  Cardiology was consulted and she underwent right heart catheterization which confirmed right heart failure and pulmonary arterial hypertension.    Assessment/Plan  Acute on chronic respiratory failure initially thought to be due to COPD exacerbation and CAP, however, more likely to be due to pulmonary arterial hypertension and RV failure.  Cardiology suspects this is due to untreated OSA and obesity.   -  Continue dulera and spiriva with prn albuterol -  Started sildenafil 20mg  TID on 4/11 -  CXR suggests fluid in fissure, possible interstitial edema at the bilateral bases -  ECHO demonstrates massively enlarged RV - -1.94 L  -  Wt unrecorded -  Lasix per heart failure team -  Creatinine trending down  Acute on chronic kidney disease stage 3, baseline creatinine of 1.2, 1.7 on admission and certainly not improving and may be worsening with IVF.  Mild hyperkalemia likely related to AKI + ARB -  Hold ARB  -  Creatinine trending down  Diabetes mellitus type 2, last A1c 7 03/2015, CBG stable to borderline low this morning.  Tapering steroids -  Continue low dose SSI  Paroxysmal atrial fibrillation, CHADs2vasc = 6, SR  with PVC on telemetry -  Continue eliquis  Crohn's disease, stable, continue sulfasalazine  Hypothyroidism, stable, continue synthroid  CAD, chest pain free.  Continue eliquis, atorvastatin, and metoprolol, ranolazine  OSA not using CPAP.  Needs repeat sleep study and to resume CPAP.  Hypokalemia due to diuresis.  Continue oral potassium supplementation  Possible gout flare.  Start cholchicine.  Dose should be halved secondary to concomitant use with ranolazine.   Diet:  Diabetic healthy heart, 1.2L fluid restriction Access:  PIV IVF:  off Proph:  lovenox  Code Status: full code Family Communication: patient alone Disposition Plan:  PT recommending SNF and cardiology feels she may be ready for discharge tomorrow   Consultants:  Cardiology, Dr. Haroldine Laws  Procedures:  CXR  Right heart catheterization  Antibiotics:  Ceftriaxone 4/7 > 4/10  Azithromcyin 4/7 > 4/10  HPI/Subjective:  States breathing is better because of oxygen.  Feet have become swollen, red and painful.    Objective: Filed Vitals:   01/09/16 2005 01/10/16 0424 01/10/16 0820 01/10/16 0850  BP: 109/68 96/46  127/61  Pulse: 93 120  85  Temp: 97.5 F (36.4 C) 97.8 F (36.6 C)  98.2 F (36.8 C)  TempSrc: Oral Oral  Oral  Resp: 20 18  18   Height:      Weight:      SpO2: 93% 95% 93% 93%    Intake/Output Summary (Last 24 hours) at 01/10/16 1541 Last data filed at 01/10/16 0853  Gross per 24 hour  Intake    480 ml  Output   1950 ml  Net  -1470 ml   Filed Weights   01/05/16 1604 01/07/16 2016 01/09/16 0407  Weight: 115.667 kg (255 lb) 127.914 kg (282 lb) 115.4 kg (254 lb 6.6 oz)   Body mass index is 35.5 kg/(m^2).  Exam:   General:  Adult female, No acute distress, on 2L [place]  HEENT:  NCAT, MMM  Cardiovascular:  IRRR, nl S1, S2 no mrg, cold extremities, delayed capillary refill  Respiratory:  Diminished bilateral BS, rales at bases, no obvious wheeze no increased WOB  Abdomen:    NABS, soft, NT/ND  MSK:   Normal tone and bulk, 1+ bilateral LEE with warm and dark red feet that are painful to palpation  Neuro:  Grossly intact  Data Reviewed: Basic Metabolic Panel:  Recent Labs Lab 01/05/16 1835 01/06/16 0609 01/07/16 0720 01/08/16 0442 01/09/16 0437 01/10/16 0822  NA  --  137 138 139 137 141  K  --  5.6* 5.5* 4.8 3.4* 3.5  CL  --  103 105 100* 93* 91*  CO2  --  22 22 25 27  38*  GLUCOSE  --  153* 143* 118* 112* 115*  BUN  --  27* 28* 33* 34* 30*  CREATININE  --  1.65* 1.86* 1.91* 1.88* 1.65*  CALCIUM  --  9.0 8.7* 8.8* 8.5* 8.3*  PHOS 4.5  --   --   --   --   --    Liver Function Tests:  Recent Labs Lab 01/05/16 1835  AST 29  ALT 14  ALKPHOS 113  BILITOT 1.1  PROT 6.4*  ALBUMIN 2.9*    Recent Labs Lab 01/05/16 1835  LIPASE 21   No results for input(s): AMMONIA in the last 168 hours. CBC:  Recent Labs Lab 01/05/16 1623 01/05/16 1835 01/06/16 0609 01/07/16 0720 01/08/16 0442 01/09/16 0437  WBC 7.9  --  5.2 8.5 7.9 8.5  NEUTROABS  --  75.0*  --   --   --   --   HGB 16.0*  --  15.3* 15.8* 15.6* 15.3*  HCT 51.0*  --  49.5* 49.6* 50.4* 47.6*  MCV 100.0  --  100.0 101.6* 101.4* 99.0  PLT 339  --  309 356 306 329    Recent Results (from the past 240 hour(s))  Culture, blood (routine x 2)     Status: None   Collection Time: 01/05/16  5:45 PM  Result Value Ref Range Status   Specimen Description BLOOD LEFT ANTECUBITAL  Final   Special Requests IN PEDIATRIC BOTTLE 2CC  Final   Culture NO GROWTH 5 DAYS  Final   Report Status 01/10/2016 FINAL  Final  Culture, blood (routine x 2)     Status: None   Collection Time: 01/05/16  5:55 PM  Result Value Ref Range Status   Specimen Description BLOOD LEFT FOREARM  Final   Special Requests IN PEDIATRIC BOTTLE 3CC  Final   Culture NO GROWTH 5 DAYS  Final   Report Status 01/10/2016 FINAL  Final     Studies: No results found.  Scheduled Meds: . antiseptic oral rinse  7 mL Mouth Rinse BID   . apixaban  5 mg Oral BID  . atorvastatin  80 mg Oral QHS  . clotrimazole  10 mg Oral 5 X Daily  . [START ON 01/11/2016] colchicine  0.3 mg Oral Daily  . folic acid  1 mg Oral Daily  . insulin aspart  0-9 Units Subcutaneous TID WC  . levothyroxine  75 mcg Oral QAC  breakfast  . metoprolol  100 mg Oral BID  . mometasone-formoterol  2 puff Inhalation BID  . multivitamin with minerals  1 tablet Oral Daily  . pantoprazole  40 mg Oral Daily  . potassium chloride  40 mEq Oral Daily  . ranolazine  500 mg Oral BID  . sildenafil  20 mg Oral TID  . sodium chloride flush  3 mL Intravenous Q12H  . sodium chloride flush  3 mL Intravenous Q12H  . sulfaSALAzine  1,000 mg Oral BID  . tiotropium  1 capsule Inhalation Daily  . torsemide  40 mg Oral BID   Continuous Infusions:    Principal Problem:   RVF (right ventricular failure) (HCC) Active Problems:   Atrial fibrillation (HCC)   CKD (chronic kidney disease) stage 3, GFR 30-59 ml/min   Type 2 diabetes mellitus with diabetic nephropathy ([place])   AKI (acute kidney injury) ([place])   Pulmonary hypertension ([place]), RHC on 01/09/2016    Time spent: 30 min    Edahi Kroening, [facility] Pager [phone]. If 7PM-7AM, please contact night-coverage at [email or web address], password [facility] 01/10/2016, 3:41 PM  LOS: 5 days

## 2016-01-10 NOTE — Care Management (Signed)
Await insurance provider response re cost of Sildenafil.  CRoyal RN MPH, case manager [phone]

## 2016-01-10 NOTE — Progress Notes (Signed)
Advanced Heart Failure Rounding Note  Primary Physician [name], MD Primary Cardiologist: Dr. Tomma Lightning. Burt Knack Reason for Consultation: CHF  Subjective:    RHC yesterday with prominent RV failure. Started sildenafil 20 mg TID.   Feels about the same.  Working with PT. Wants to go to rehab. Says her breathing hasn't really changed, but her legs are much warmer.  Legs are also sore, similar to previous gout attacks.   Creatinine stable to improved. K 3.4  RHC 01/09/16 RA = 15 RV = 52/8/12 PA = 54/28 (39) PCW = 13 Fick cardiac output/index = 3.5/1.5 Thermo CO/CI = 4.4/1.9 PVR = 5.9 WU Ao sat = 95% PA sat = 54%, 51%    Objective:   Weight Range: 254 lb 6.6 oz (115.4 kg) Body mass index is 35.5 kg/(m^2).   Vital Signs:   Temp:  [97.5 F (36.4 C)-97.8 F (36.6 C)] 97.8 F (36.6 C) (04/12 0424) Pulse Rate:  [0-133] 120 (04/12 0424) Resp:  [0-30] 18 (04/12 0424) BP: (96-148)/(46-102) 96/46 mmHg (04/12 0424) SpO2:  [0 %-99 %] 93 % (04/12 0820) FiO2 (%):  [28 %] 28 % (04/11 1930) Last BM Date: 01/09/16  Weight change: Filed Weights   01/05/16 1604 01/07/16 2016 01/09/16 0407  Weight: 255 lb (115.667 kg) 282 lb (127.914 kg) 254 lb 6.6 oz (115.4 kg)    Intake/Output:   Intake/Output Summary (Last 24 hours) at 01/10/16 0852 Last data filed at 01/10/16 0602  Gross per 24 hour  Intake    360 ml  Output   2300 ml  Net  -1940 ml     Physical Exam: General:  Elderly appearing HEENT: normal Neck: supple. JVP difficult to assess due to pt girth. Carotids 2+ bilat; no bruits. No thyromegaly or nodule noted. Cor: PMI nondisplaced. RRR. No M/G/R appreciated Lungs: Mild basilar crackles, scattered rhonchi Abdomen: morbidly obese, soft, NT, ND, no HSM. No bruits or masses. +BS  Extremities: no clubbing, rash. Legs warm with good color. Trace-1+ ankle edema Neuro: alert & orientedx3, cranial nerves grossly intact. moves all 4 extremities w/o difficulty. Affect  pleasant  Telemetry: Reviewed, sinus tach 100s  Labs: CBC  Recent Labs  01/08/16 0442 01/09/16 0437  WBC 7.9 8.5  HGB 15.6* 15.3*  HCT 50.4* 47.6*  MCV 101.4* 99.0  PLT 306 [id number]   Basic Metabolic Panel  Recent Labs  01/08/16 0442 01/09/16 0437  NA 139 137  K 4.8 3.4*  CL 100* 93*  CO2 25 27  GLUCOSE 118* 112*  BUN 33* 34*  CREATININE 1.91* 1.88*  CALCIUM 8.8* 8.5*   Liver Function Tests No results for input(s): AST, ALT, ALKPHOS, BILITOT, PROT, ALBUMIN in the last 72 hours. No results for input(s): LIPASE, AMYLASE in the last 72 hours. Cardiac Enzymes No results for input(s): CKTOTAL, CKMB, CKMBINDEX, TROPONINI in the last 72 hours.  BNP: BNP (last 3 results)  Recent Labs  10/27/15 1317 01/05/16 1745 01/08/16 0442  BNP 1318.1* 844.9* 790.0*    ProBNP (last 3 results) No results for input(s): PROBNP in the last 8760 hours.   D-Dimer No results for input(s): DDIMER in the last 72 hours. Hemoglobin A1C No results for input(s): HGBA1C in the last 72 hours. Fasting Lipid Panel No results for input(s): CHOL, HDL, LDLCALC, TRIG, CHOLHDL, LDLDIRECT in the last 72 hours. Thyroid Function Tests No results for input(s): TSH, T4TOTAL, T3FREE, THYROIDAB in the last 72 hours.  Invalid input(s): FREET3  Other results:     Imaging/Studies:  No results found.  Latest Echo  Latest Cath   Medications:     Scheduled Medications: . antiseptic oral rinse  7 mL Mouth Rinse BID  . apixaban  5 mg Oral BID  . atorvastatin  80 mg Oral QHS  . folic acid  1 mg Oral Daily  . furosemide  80 mg Intravenous BID  . insulin aspart  0-9 Units Subcutaneous TID WC  . levothyroxine  75 mcg Oral QAC breakfast  . metoprolol  100 mg Oral BID  . mometasone-formoterol  2 puff Inhalation BID  . multivitamin with minerals  1 tablet Oral Daily  . pantoprazole  40 mg Oral Daily  . potassium chloride  40 mEq Oral Daily  . ranolazine  500 mg Oral BID  . sildenafil  20 mg  Oral TID  . sodium chloride flush  3 mL Intravenous Q12H  . sodium chloride flush  3 mL Intravenous Q12H  . sulfaSALAzine  1,000 mg Oral BID  . tiotropium  1 capsule Inhalation Daily    Infusions:    PRN Medications: sodium chloride, sodium chloride, acetaminophen, albuterol, diphenhydrAMINE, metoprolol, ondansetron (ZOFRAN) IV, sodium chloride flush, sodium chloride flush   Assessment   1. Combined systolic diastolic CHF - Echo [id number] LVEF 40-45% RV severely dilated and reduced. 2. 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 Aflutter 4. CAD 5. DM 6. COPD on chronic 02 2L at night.  7. Chronic anticoagulation on Eliquis 8. Deconditioning 9. Morbid obesity  Plan    Pt has end-stage RHF with severe RV dilation and hypokinesis likely related to chronic hypoxia and OHS.    Will treat likely gout flare with colchicine. Likely from IV diuresis.   Volume status still appears mildly elevated. Would continued IV lasix 80 mg BID for at least today.  Supp K.  Continue gentle diuresis.  No weight yet for this morning.   Likely that a large part of her symptoms would improve with weight loss, but very deconditioned. PT recommends SNF. Continue to work with PT. Pt asked about possibility of CIR. Do not think she would meet 3 hours of rehab a day requirement.  Length of Stay: 5   Shirley Friar PA-C 01/10/2016, 8:52 AM  Advanced Heart Failure Team Pager [phone] (M-F; 7a - 4p)  Please contact [facility] for night-coverage after hours (4p -7a ) and weekends on [email or web address]  Patient seen and examined with [name], PA-C. We discussed all aspects of the encounter. I agree with the assessment and plan as stated above.   RHC results reviewed with her. She is feeling some better. Continues to diurese.   Discussed approach to her PAH/cor pulmonale 1) Diurese as tolerated. Switch lasix to torsemide 40/bid. (May need to decrease to 40/20) 2) Pulmonary artery vasodilators -  start sildenafil 20 tid (will need case manager consult to afford) 3) Treat OSA/OHS. She acknowledges weight loss will be hard given her limited mobility. Will need sleeps study.  4) Place compression hose  Likely ready for d/c to SNF tomorrow. Watch renal function and potassium closely with diuresis.   Bensimhon, Daniel,MD 2:25 PM

## 2016-01-11 DIAGNOSIS — E876 Hypokalemia: Secondary | ICD-10-CM | POA: Insufficient documentation

## 2016-01-11 LAB — BASIC METABOLIC PANEL
ANION GAP: 17 — AB (ref 5–15)
Anion gap: 16 — ABNORMAL HIGH (ref 5–15)
BUN: 29 mg/dL — AB (ref 6–20)
BUN: 28 mg/dL — AB (ref 6–20)
CHLORIDE: 86 mmol/L — AB (ref 101–111)
CHLORIDE: 86 mmol/L — AB (ref 101–111)
CO2: 35 mmol/L — AB (ref 22–32)
CO2: 36 mmol/L — AB (ref 22–32)
CREATININE: 1.35 mg/dL — AB (ref 0.44–1.00)
Calcium: 7.8 mg/dL — ABNORMAL LOW (ref 8.9–10.3)
Calcium: 7.8 mg/dL — ABNORMAL LOW (ref 8.9–10.3)
Creatinine, Ser: 1.38 mg/dL — ABNORMAL HIGH (ref 0.44–1.00)
GFR calc Af Amer: 43 mL/min — ABNORMAL LOW (ref >60)
GFR calc Af Amer: 42 mL/min — ABNORMAL LOW (ref >60)
GFR calc non Af Amer: 37 mL/min — ABNORMAL LOW (ref >60)
GFR calc non Af Amer: 36 mL/min — ABNORMAL LOW (ref >60)
GLUCOSE: 173 mg/dL — AB (ref 65–99)
GLUCOSE: 199 mg/dL — AB (ref 65–99)
POTASSIUM: 3 mmol/L — AB (ref 3.5–5.1)
Potassium: 2.6 mmol/L — CL (ref 3.5–5.1)
SODIUM: 137 mmol/L (ref 135–145)
Sodium: 139 mmol/L (ref 135–145)

## 2016-01-11 LAB — GLUCOSE, CAPILLARY
GLUCOSE-CAPILLARY: 176 mg/dL — AB (ref 65–99)
GLUCOSE-CAPILLARY: 228 mg/dL — AB (ref 65–99)
Glucose-Capillary: 133 mg/dL — ABNORMAL HIGH (ref 65–99)
Glucose-Capillary: 133 mg/dL — ABNORMAL HIGH (ref 65–99)

## 2016-01-11 LAB — MAGNESIUM: Magnesium: 1.3 mg/dL — ABNORMAL LOW (ref 1.7–2.4)

## 2016-01-11 MED ORDER — MAGNESIUM SULFATE 4 GM/100ML IV SOLN
4.0000 g | Freq: Once | INTRAVENOUS | Status: AC
  Administered 2016-01-11: 4 g via INTRAVENOUS
  Filled 2016-01-11 (×2): qty 100

## 2016-01-11 MED ORDER — SPIRONOLACTONE 25 MG PO TABS
12.5000 mg | ORAL_TABLET | Freq: Every day | ORAL | Status: DC
  Administered 2016-01-11 – 2016-01-12 (×2): 12.5 mg via ORAL
  Filled 2016-01-11 (×2): qty 1

## 2016-01-11 MED ORDER — POTASSIUM CHLORIDE CRYS ER 20 MEQ PO TBCR
40.0000 meq | EXTENDED_RELEASE_TABLET | ORAL | Status: AC
  Administered 2016-01-11 (×2): 40 meq via ORAL
  Filled 2016-01-11 (×2): qty 2

## 2016-01-11 MED ORDER — TIOTROPIUM BROMIDE MONOHYDRATE 18 MCG IN CAPS
18.0000 ug | ORAL_CAPSULE | Freq: Every day | RESPIRATORY_TRACT | Status: DC
  Administered 2016-01-11 – 2016-01-12 (×2): 18 ug via RESPIRATORY_TRACT
  Filled 2016-01-11: qty 5

## 2016-01-11 NOTE — Care Management Important Message (Signed)
Important Message  Patient Details  Name: [name] MRN: [id number] Date of Birth: Aug 06, 1941   Medicare Important Message Given:  Yes    Kylena Mole P Kerah Hardebeck 01/11/2016, 1:02 PM

## 2016-01-11 NOTE — Clinical Social Work Note (Signed)
Clinical Social Work Assessment  Patient Details  Name: [name] MRN: [id number] Date of Birth: 1941/04/27  Date of referral:  01/10/16               Reason for consult:  Facility Placement                Permission sought to share information with:  Family Supports Permission granted to share information::  Yes, Verbal Permission Granted  Name::     Eugenie Birks  Agency::     Relationship::  Son-in-law  Contact Information:  [phone]  Housing/Transportation Living arrangements for the past 2 months:  [place] of Information:  Patient Patient Interpreter Needed:  None Criminal Activity/Legal Involvement Pertinent to Current Situation/Hospitalization:  No - Comment as needed Significant Relationships:  Adult Children, Other Family Members Lives with:  Self Do you feel safe going back to the place where you live?  No (Patient understands that ST rehab needed before returning home to assure her safety) Need for family participation in patient care:  Yes (Comment)  Care giving concerns:  Patient indicated that she needs rehab prior to returning home as she lives alone.   Social Worker assessment / plan:  CSW talked with patient at the bedside regarding recommendation of ST rehab. Patient was lying in bed and is alert/oriented and was open to talking with CSW about discharge planning.  Ms. Isgro reported that she lives alone and is agreeable to [facility], with her facility preference as [facility].  Patient informed of facility search process and that [facility] will then be contacted regarding her  preference for their facility.  Employment status:  Retired Forensic scientist:  Programmer, applications ([employer]) PT Recommendations:  [place] / Referral to community resources:  [place] (Patient provided with SNF list for [employer])  Patient/Family's Response to care:  No concerns expressed  regarding the care during her hospitalization.  Patient/Family's Understanding of and Emotional Response to Diagnosis, Current Treatment, and Prognosis:  Not discussed.  Emotional Assessment Appearance:  Appears stated age Attitude/Demeanor/Rapport:  Other (Appropriate) Affect (typically observed):  Appropriate, Calm Orientation:  Oriented to Self, Oriented to Place, Oriented to  Time, Oriented to Situation Alcohol / Substance use:  Tobacco Use (Patient reported that she quit smoling 29 years ago and does not drink or use illicit drugs.) Psych involvement (Current and /or in the community):  No (Comment)  Discharge Needs  Concerns to be addressed:  Discharge Planning Concerns Readmission within the last 30 days:  No Current discharge risk:  None Barriers to Discharge:  No Barriers Identified   Sable Feil, LCSW 01/11/2016, 4:21 PM

## 2016-01-11 NOTE — Care Management Note (Signed)
Case Management Note  Patient Details  Name: ISADORE KAMRATH MRN: [id number] Date of Birth: Jul 20, 1941  Subjective/Objective:        CM following for progression and d/c planning.            Action/Plan: 01/10/16 Calls placed to pt insurance provider re medication coverage for Sildenafil, they recognize Revatio only and state that this is a level 4 drug require prior authorization by PT/NP or MD. They phone number to get prior auth if [phone].  Expected Discharge Date:                Expected Discharge Plan:  [place]  In-House Referral:     Discharge planning Services     Post Acute Care Choice:    Choice offered to:     DME Arranged:    DME Agency:     HH Arranged:    [place] Agency:     Status of Service:     Medicare Important Message Given:  Yes Date Medicare IM Given:    Medicare IM give by:    Date Additional Medicare IM Given:    Additional Medicare Important Message give by:     If discussed at [place] of Stay Meetings, dates discussed:    Additional Comments:  Adron Bene, RN 01/11/2016, 2:34 PM

## 2016-01-11 NOTE — Progress Notes (Signed)
Occupational Therapy Treatment Patient Details Name: [name] MRN: [id number] DOB: [birth year] Today's Date: 01/11/2016    History of present illness 75 year old female PMH of Chronic combined systolic and diastolic CHF, morbid obesity, HLD, tachybradycardia syndrome status post PPM, COPD, depression, GERD, hypothyroid, A. fib, DM, CAD, OSA-not on CPAP, chronic respiratory failure on home oxygen 2 L/m at bedtime, recent parathyroidectomy, recent hospitalization 10/10/15-10/13/15 for acute on chronic combined CHF, A. fib with RVR and symptomatic hypocalcemia from recent parathyroidectomy,. Admitted for Acute on chronic respiratory failure possibly secondary to COPD exacerbation and CAP   OT comments  Patient making good progress towards OT goals. Pt met mod I goal of transferring to/from [facility]; upgraded goal to -  patient will transfer to toilet with modified independence, ambulating, bedside commode (over toilet seat in BR).    Follow Up Recommendations  SNF;Supervision/Assistance - 24 hour    Equipment Recommendations  Other (comment) (TBD next venue of care)    Recommendations for Other Services  None at this time   Precautions / Restrictions Precautions Precautions: Fall Precaution Comments: monitor O2 Restrictions Weight Bearing Restrictions: No          Balance Overall balance assessment: Needs assistance Sitting-balance support: No upper extremity supported;Feet supported Sitting balance-Leahy Scale: Normal   ADL General ADL Comments: Pt states she has been transferring to/from [facility] and to/from recliner without assistance from staff (confirmed this with patient's NT). Administerred energy conservation handout and educated pt on energy conservation techniques. Pt refused ambulating due to complaints of her BLEs being sore and swollen. Encouraged elevation and movement of BLEs to decrease soreness and swelling.            Cognition   Behavior During Therapy: WFL for tasks  assessed/performed Overall Cognitive Status: Within Functional Limits for tasks assessed                Pertinent Vitals/ Pain       Pain Assessment: Faces Faces Pain Scale: Hurts a little bit Pain Location: BLEs Pain Descriptors / Indicators: Aching;Sore Pain Intervention(s): Limited activity within patient's tolerance;Monitored during session         Frequency Min 2X/week     Progress Toward Goals  OT Goals(current goals can now be found in the care plan section)  Progress towards OT goals: Progressing toward goals  Acute Rehab OT Goals Patient Stated Goal: go to rehab  OT Goal Formulation: With patient Time For Goal Achievement: 01/15/16 Potential to Achieve Goals: Good  Plan Discharge plan remains appropriate    End of Session Equipment Utilized During Treatment: Oxygen   Activity Tolerance Patient tolerated treatment well   Patient Left in bed;with call bell/phone within reach (seated EOB)  Nurse Communication Other (comment) (confirmed with NT that pt has been transferring to/from [facility] and recliner prn by herself)        Time: 8118-8677 OT Time Calculation (min): 13 min  Charges: OT General Charges $OT Visit: 1 Procedure OT Treatments $Self Care/Home Management : 8-22 mins  Chrys Racer , MS, OTR/L, CLT Pager: [phone]  01/11/2016, 2:09 PM

## 2016-01-11 NOTE — Progress Notes (Signed)
[facility] PROGRESS NOTE  [name] [id number] DOB: Apr 09, 1941 DOA: 01/05/2016 PCP: [name], MD  Brief narrative 75 y.o. female with h/o COPD on 2L home O2 at night, type 2 diabetes mellitus, paroxysmal A. fib on Eliquis, parathyroidectomy 4 months back presents to the ED with c/o increasing fatigue, generalized weakness, SOB, cough. She discontinued lasix 4 days ago due to poor UOP with nausea, vomiting, and bloating. Denies leg swellings, orthopnea or PND.  Initially, she was thought to have CAP with wheezing from COPD exacerbation, but did not improve with steroids and antibiotics.  Upon further evaluation she had acute CHF.  Cardiology  consulted and  underwent right heart catheterization which showed right heart failure and pulmonary arterial hypertension.   Assessment/Plan: Acute on chronic respiratory failure secondary to right middle failure and pulmonary artery hypertension Suspect due to untreated OSA/ OHS. ? Cor pulmonale. Started on sildenafil. diuresing well. (Negative balance of 4 L since admission) . Switched to [employer] and added aldactone. replenish low k and mg. Patient will need sleep study as outpatient. (Can be arranged either by her PCP or by her pulmonologist Dr. Chase Caller)   Acute on chronic kidney disease stage III Suspect cardiorenal syndrome. Improved with diuretics. Holding ARB.  Severe hypokalemia/hypomagnesemia Replenished. Repeat labs in a.m.  Diabetes mellitus type II Stable. Continue sliding scale coverage.  Paroxysmal A. Fib Continue Eliquis  Hypothyroidism Continue Synthroid  CAD Stable. Continue beta blocker, ranolazine and statin.  ?OSA Daughter informs excessive daytime sleepiness. Recommend outpatient sleep study.  Diet: Heart healthy/diabetic  DVT prophylaxis: On Eliquis   Code Status: Full code Family Communication: Daughter at bedside Disposition Plan: PT recommend skilled nursing facility. Should be  stable for discharge tomorrow if diuresing well and electrolytes normal. Patient wants to go to [facility].   Consultants:  Dr. Haroldine Laws  Procedures:  2-D echo  Right heart catheterization  Antibiotics:  None  HPI/Subjective: Seen and examined. Still has some shortness of breath and feels tired.  Objective: Filed Vitals:   01/11/16 0416 01/11/16 0757  BP: 102/49 105/60  Pulse: 94 93  Temp: 99.1 F (37.3 C) 98.4 F (36.9 C)  Resp: 20 18    Intake/Output Summary (Last 24 hours) at 01/11/16 1119 Last data filed at 01/11/16 1113  Gross per 24 hour  Intake    480 ml  Output    501 ml  Net    -21 ml   Filed Weights   01/07/16 2016 01/09/16 0407 01/10/16 2023  Weight: 127.914 kg (282 lb) 115.4 kg (254 lb 6.6 oz) 115.4 kg (254 lb 6.6 oz)    Exam:   General:  Elderly female appears fatigued, not in distress  HEENT: No pallor, moist mucosa, supple neck  Cardiovascular: Normal S1 and S2, no murmurs rub or gallop  Respiratory: Fine bibasilar crackles  Abdomen: Soft, nondistended, nontender, bowel sounds present  Musculoskeletal: Warm, trace edema  CNS: Alert and oriented  Data Reviewed: Basic Metabolic Panel:  Recent Labs Lab 01/05/16 1835  01/07/16 0720 01/08/16 0442 01/09/16 0437 01/10/16 0822 01/11/16 0532  NA  --   < > 138 139 137 141 137  K  --   < > 5.5* 4.8 3.4* 3.5 2.6*  CL  --   < > 105 100* 93* 91* 86*  CO2  --   < > 22 25 27  38* 35*  GLUCOSE  --   < > 143* 118* 112* 115* 173*  BUN  --   < > 28* 33* 34*  30* 29*  CREATININE  --   < > 1.86* 1.91* 1.88* 1.65* 1.35*  CALCIUM  --   < > 8.7* 8.8* 8.5* 8.3* 7.8*  MG  --   --   --   --   --   --  1.3*  PHOS 4.5  --   --   --   --   --   --   < > = values in this interval not displayed. Liver Function Tests:  Recent Labs Lab 01/05/16 1835  AST 29  ALT 14  ALKPHOS 113  BILITOT 1.1  PROT 6.4*  ALBUMIN 2.9*    Recent Labs Lab 01/05/16 1835  LIPASE 21   No results for input(s):  AMMONIA in the last 168 hours. CBC:  Recent Labs Lab 01/05/16 1623 01/05/16 1835 01/06/16 0609 01/07/16 0720 01/08/16 0442 01/09/16 0437  WBC 7.9  --  5.2 8.5 7.9 8.5  NEUTROABS  --  75.0*  --   --   --   --   HGB 16.0*  --  15.3* 15.8* 15.6* 15.3*  HCT 51.0*  --  49.5* 49.6* 50.4* 47.6*  MCV 100.0  --  100.0 101.6* 101.4* 99.0  PLT 339  --  309 356 306 329   Cardiac Enzymes: No results for input(s): CKTOTAL, CKMB, CKMBINDEX, TROPONINI in the last 168 hours. BNP (last 3 results)  Recent Labs  10/27/15 1317 01/05/16 1745 01/08/16 0442  BNP 1318.1* 844.9* 790.0*    ProBNP (last 3 results) No results for input(s): PROBNP in the last 8760 hours.  CBG:  Recent Labs Lab 01/10/16 0730 01/10/16 1206 01/10/16 1606 01/10/16 2022 01/11/16 0754  GLUCAP 129* 178* 208* 202* 133*    Recent Results (from the past 240 hour(s))  Culture, blood (routine x 2)     Status: None   Collection Time: 01/05/16  5:45 PM  Result Value Ref Range Status   Specimen Description BLOOD LEFT ANTECUBITAL  Final   Special Requests IN PEDIATRIC BOTTLE 2CC  Final   Culture NO GROWTH 5 DAYS  Final   Report Status 01/10/2016 FINAL  Final  Culture, blood (routine x 2)     Status: None   Collection Time: 01/05/16  5:55 PM  Result Value Ref Range Status   Specimen Description BLOOD LEFT FOREARM  Final   Special Requests IN PEDIATRIC BOTTLE 3CC  Final   Culture NO GROWTH 5 DAYS  Final   Report Status 01/10/2016 FINAL  Final     Studies: No results found.  Scheduled Meds: . antiseptic oral rinse  7 mL Mouth Rinse BID  . apixaban  5 mg Oral BID  . atorvastatin  80 mg Oral QHS  . clotrimazole  10 mg Oral 5 X Daily  . colchicine  0.3 mg Oral Daily  . folic acid  1 mg Oral Daily  . insulin aspart  0-9 Units Subcutaneous TID WC  . levothyroxine  75 mcg Oral QAC breakfast  . metoprolol  100 mg Oral BID  . mometasone-formoterol  2 puff Inhalation BID  . multivitamin with minerals  1 tablet Oral  Daily  . pantoprazole  40 mg Oral Daily  . potassium chloride  40 mEq Oral Daily  . potassium chloride  40 mEq Oral Q4H  . ranolazine  500 mg Oral BID  . sildenafil  20 mg Oral TID  . sodium chloride flush  3 mL Intravenous Q12H  . sodium chloride flush  3 mL Intravenous Q12H  .  spironolactone  12.5 mg Oral Daily  . sulfaSALAzine  1,000 mg Oral BID  . tiotropium  18 mcg Inhalation Daily  . torsemide  40 mg Oral BID   Continuous Infusions:     Time spent: 25 minutes    Louellen Molder  [facility] Pager [phone] If 7PM-7AM, please contact night-coverage at [email or web address], password [facility] 01/11/2016, 11:19 AM  LOS: 6 days

## 2016-01-11 NOTE — Progress Notes (Signed)
CRITICAL LAB VALUE POTASSIUM: 2.6 MD Notified.  Ermalinda Memos, RN

## 2016-01-11 NOTE — Progress Notes (Signed)
Advanced Heart Failure Rounding Note  Primary Physician [name], MD Primary Cardiologist: Dr. Tomma Lightning. Burt Knack Reason for Consultation: CHF  Subjective:    RHC 01/09/16 with prominent RV failure. Started sildenafil 20 mg TID.   Feels OK this am. Continues to urinate well on po diuretics.   Creatinine trending down. K 2.6 this am. In for supp.   RHC 01/09/16 RA = 15 RV = 52/8/12 PA = 54/28 (39) PCW = 13 Fick cardiac output/index = 3.5/1.5 Thermo CO/CI = 4.4/1.9 PVR = 5.9 WU Ao sat = 95% PA sat = 54%, 51%    Objective:   Weight Range: 254 lb 6.6 oz (115.4 kg) Body mass index is 35.5 kg/(m^2).   Vital Signs:   Temp:  [98.2 F (36.8 C)-99.1 F (37.3 C)] 99.1 F (37.3 C) (04/13 0416) Pulse Rate:  [85-95] 94 (04/13 0416) Resp:  [18-20] 20 (04/13 0416) BP: (102-135)/(49-78) 102/49 mmHg (04/13 0416) SpO2:  [92 %-96 %] 94 % (04/13 0416) Weight:  [254 lb 6.6 oz (115.4 kg)] 254 lb 6.6 oz (115.4 kg) (04/12 2023) Last BM Date: 01/10/16  Weight change: Filed Weights   01/07/16 2016 01/09/16 0407 01/10/16 2023  Weight: 282 lb (127.914 kg) 254 lb 6.6 oz (115.4 kg) 254 lb 6.6 oz (115.4 kg)    Intake/Output:   Intake/Output Summary (Last 24 hours) at 01/11/16 0751 Last data filed at 01/11/16 0600  Gross per 24 hour  Intake    600 ml  Output    851 ml  Net   -251 ml     Physical Exam: General:  Elderly appearing HEENT: normal Neck: supple. JVP difficult to assess due to pt girth but does not appear elevated. Carotids 2+ bilat; no bruits. No thyromegaly or lymphadenopathy noted. Cor: PMI nondisplaced. RRR. No M/G/R appreciated Lungs: Mild basilar crackles, scattered rhonchi Abdomen: morbidly obese, soft, non-tender, non-distended, no HSM. No bruits or masses. +BS  Extremities: no clubbing, rash. Legs warm with good color. Trace-1+ ankle edema Neuro: alert & orientedx3, cranial nerves grossly intact. moves all 4 extremities w/o difficulty. Affect  pleasant  Telemetry: Reviewed, NSR 90s  Labs: CBC  Recent Labs  01/09/16 0437  WBC 8.5  HGB 15.3*  HCT 47.6*  MCV 99.0  PLT [id number]   Basic Metabolic Panel  Recent Labs  01/10/16 0822 01/11/16 0532  NA 141 137  K 3.5 2.6*  CL 91* 86*  CO2 38* 35*  GLUCOSE 115* 173*  BUN 30* 29*  CREATININE 1.65* 1.35*  CALCIUM 8.3* 7.8*   Liver Function Tests No results for input(s): AST, ALT, ALKPHOS, BILITOT, PROT, ALBUMIN in the last 72 hours. No results for input(s): LIPASE, AMYLASE in the last 72 hours. Cardiac Enzymes No results for input(s): CKTOTAL, CKMB, CKMBINDEX, TROPONINI in the last 72 hours.  BNP: BNP (last 3 results)  Recent Labs  10/27/15 1317 01/05/16 1745 01/08/16 0442  BNP 1318.1* 844.9* 790.0*    ProBNP (last 3 results) No results for input(s): PROBNP in the last 8760 hours.   D-Dimer No results for input(s): DDIMER in the last 72 hours. Hemoglobin A1C No results for input(s): HGBA1C in the last 72 hours. Fasting Lipid Panel No results for input(s): CHOL, HDL, LDLCALC, TRIG, CHOLHDL, LDLDIRECT in the last 72 hours. Thyroid Function Tests No results for input(s): TSH, T4TOTAL, T3FREE, THYROIDAB in the last 72 hours.  Invalid input(s): FREET3  Other results:     Imaging/Studies:  No results found.  Latest Echo  Latest Cath  Medications:     Scheduled Medications: . antiseptic oral rinse  7 mL Mouth Rinse BID  . apixaban  5 mg Oral BID  . atorvastatin  80 mg Oral QHS  . clotrimazole  10 mg Oral 5 X Daily  . colchicine  0.3 mg Oral Daily  . folic acid  1 mg Oral Daily  . insulin aspart  0-9 Units Subcutaneous TID WC  . levothyroxine  75 mcg Oral QAC breakfast  . metoprolol  100 mg Oral BID  . mometasone-formoterol  2 puff Inhalation BID  . multivitamin with minerals  1 tablet Oral Daily  . pantoprazole  40 mg Oral Daily  . potassium chloride  40 mEq Oral Daily  . potassium chloride  40 mEq Oral Q4H  . ranolazine  500 mg Oral  BID  . sildenafil  20 mg Oral TID  . sodium chloride flush  3 mL Intravenous Q12H  . sodium chloride flush  3 mL Intravenous Q12H  . sulfaSALAzine  1,000 mg Oral BID  . tiotropium  1 capsule Inhalation Daily  . torsemide  40 mg Oral BID    Infusions:    PRN Medications: sodium chloride, sodium chloride, acetaminophen, albuterol, diphenhydrAMINE, metoprolol, ondansetron (ZOFRAN) IV, sodium chloride flush, sodium chloride flush   Assessment   1. Combined systolic diastolic CHF - Echo [id number] LVEF 40-45% RV severely dilated and reduced. 2. 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 Aflutter 4. CAD 5. DM 6. COPD on chronic 02 2L at night.  7. Chronic anticoagulation on Eliquis 8. Deconditioning 9. Morbid obesity  Plan    Pt has end-stage RHF with severe RV dilation and hypokinesis likely related to chronic hypoxia and OHS.    Supp K aggressively. Will continue torsemide 40 mg BID for home. Will need at least 40 meq of K daily. Should get BMET next week if home today or tomorrow. We will set up for HF follow up in 1-2 weeks.   Continue gentle diuresis.  No weight yet for this morning.   Needs to lose weight and is very deconditioned.  Going to SNF on d/c.   Length of Stay: 6   Shirley Friar PA-C 01/11/2016, 7:51 AM  Advanced Heart Failure Team Pager [phone] (M-F; 7a - 4p)  Please contact [facility] for night-coverage after hours (4p -7a ) and weekends on [email or web address]  Patient seen and examined with [name], PA-C. We discussed all aspects of the encounter. I agree with the assessment and plan as stated above.   Much improved. Volume status better. Breathing better. Still with some LE edema. K low. Continue demadex. Add spiro 12.5 daily. Supp K. Likely to SNF tomorrow.   Oralia Criger,MD 10:25 AM

## 2016-01-11 NOTE — NC FL2 (Signed)
[place] LEVEL OF CARE SCREENING TOOL     IDENTIFICATION  Patient Name: [name] Birthdate: 04/19/1941 Sex: female Admission Date (Current Location): 01/05/2016  [facility] and [place] Number:  Herbalist and Address:  [facility]. [facility] [place] [address], [place], [place] [address]      Provider Number: [id number]  Attending Physician Name and Address:  Louellen Molder, MD  Relative Name and Phone Number:  Eugenie Birks - son-in-law; phone [phone]    Current Level of Care: Hospital Recommended Level of Care: [place] Prior Approval Number:    Date Approved/Denied:   PASRR Number: [id number] (Eff. 02/17/15)  Discharge Plan: SNF    Current Diagnoses: Patient Active Problem List   Diagnosis Date Noted  . Hypokalemia   . Chronic atrial fibrillation ([place])   . Pulmonary hypertension ([place]), RHC on 01/09/2016 01/09/2016  . RVF (right ventricular failure) ([place])   . Acute exacerbation of CHF (congestive heart failure) ([place]) 10/27/2015  . UTI (lower urinary tract infection) 10/27/2015  . AKI (acute kidney injury) ([place]) 10/16/2015  . Hypomagnesemia 10/13/2015  . Malnutrition of moderate degree 10/11/2015  . Hypocalcemia 10/10/2015  . Acute on chronic combined systolic and diastolic CHF, NYHA class 3 ([place]) 10/10/2015  . Near syncope 03/28/2015  . Uncomplicated asthma [id number]  . CKD (chronic kidney disease) stage 3, GFR 30-59 ml/min 03/13/2015  . Chronic obstructive pulmonary disease ([place]) 03/13/2015  . Crohn's disease of large intestine without complication ([place]) [id number]  . Gastro-esophageal reflux disease without esophagitis 03/13/2015  . Personal history of infectious and parasitic disease 03/13/2015  . Gout 03/13/2015  . Hypertensive heart disease with CHF (congestive heart failure) ([place]) 03/13/2015  . Hypothyroidism 03/13/2015  . Low back pain 03/13/2015  . Extreme obesity ([place]) 03/13/2015  . Paroxysmal  atrial fibrillation ([place]) 03/13/2015  . Osteopenia 03/13/2015  . Primary hyperparathyroidism ([place]) 03/13/2015  . Secondary polycythemia 03/13/2015  . Sleep apnea 03/13/2015  . Type 2 diabetes mellitus with diabetic nephropathy ([place]) 03/13/2015  . OSA (obstructive sleep apnea) 02/18/2015  . Pulmonary hypertension due to sleep-disordered breathing ([place]) 02/18/2015  . Acute on chronic respiratory failure with hypoxia ([place]) 02/18/2015  . Hyperparathyroidism, primary ([place]) 01/30/2015  . Obesity 11/10/2014  . Ischemia of hand 08/15/2014  . Morbid obesity with BMI of 40.0-44.9, adult ([place]) 06/22/2012  . Coronary artery disease 06/15/2012  . Long term current use of anticoagulant therapy 01/11/2011  . CARDIAC PACEMAKER IN SITU-MEDTRONIC ADAPTA L 01/15/2010  . DIASTOLIC HEART FAILURE, CHRONIC 12/22/2008  . DYSLIPIDEMIA 09/05/2008  . OBESITY-MORBID (>100') 09/05/2008  . OTHER OSTEOPOROSIS 09/05/2008  . CROHN'S DISEASE, HX OF 09/05/2008  . 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 HYPERSOMNIA 12/21/2007  . ANXIETY 12/18/2007  . DEPRESSION 12/18/2007  . Atrial fibrillation ([place]) 12/18/2007    Orientation RESPIRATION BLADDER Height & Weight     Self, Time, Situation, Place  Normal Continent Weight: 254 lb 6.6 oz (115.4 kg) Height:  5\' 11"  (180.3 cm)  BEHAVIORAL SYMPTOMS/MOOD NEUROLOGICAL BOWEL NUTRITION STATUS      Continent Diet (Heart healthy - Carb modified)  AMBULATORY STATUS COMMUNICATION OF NEEDS Skin   Limited Assist Verbally Normal                       Personal Care Assistance Level of Assistance  Bathing, Feeding, Dressing Bathing Assistance: Limited assistance Feeding assistance: Independent (Needs assistance with set-up) Dressing Assistance: Limited assistance     Functional Limitations  Info  Sight, Hearing, Speech Sight Info: Adequate Hearing Info: Adequate Speech Info: Adequate    SPECIAL CARE FACTORS FREQUENCY  PT (By licensed PT), OT (By licensed OT)      PT Frequency: Evaluated 4/10. Recommended a minimum of 3X per week therapy. OT Frequency: Evaluated 4/10. Recommended a minimum os 2X per week therapy.            Contractures Contractures Info: Not present    Additional Factors Info  Code Status, Allergies Code Status Info: Full code Allergies Info: No known allergies           Current Medications (01/11/2016):  This is the current hospital active medication list Current Facility-Administered Medications  Medication Dose Route Frequency Provider Last Rate Last Dose  . 0.9 %  sodium chloride infusion  250 mL Intravenous PRN Jolaine Artist, MD      . 0.9 %  sodium chloride infusion  250 mL Intravenous PRN Jolaine Artist, MD      . acetaminophen (TYLENOL) tablet 650 mg  650 mg Oral Q4H PRN Jolaine Artist, MD      . albuterol (PROVENTIL) (2.5 MG/3ML) 0.083% nebulizer solution 2.5 mg  2.5 mg Nebulization Q6H PRN Etta Quill, DO      . antiseptic oral rinse (CPC / CETYLPYRIDINIUM CHLORIDE 0.05%) solution 7 mL  7 mL Mouth Rinse BID Etta Quill, DO   7 mL at 01/11/16 1121  . apixaban (ELIQUIS) tablet 5 mg  5 mg Oral BID Etta Quill, DO   5 mg at 01/11/16 1118  . atorvastatin (LIPITOR) tablet 80 mg  80 mg Oral QHS Etta Quill, DO   80 mg at 01/10/16 2117  . clotrimazole (MYCELEX) troche 10 mg  10 mg Oral 5 X Daily Janece Canterbury, MD   10 mg at 01/11/16 1234  . colchicine tablet 0.3 mg  0.3 mg Oral Daily Janece Canterbury, MD   0.3 mg at 01/11/16 1118  . diphenhydrAMINE (BENADRYL) capsule 25 mg  25 mg Oral QHS PRN Gardiner Barefoot, NP   25 mg at [id number]  . folic acid (FOLVITE) tablet 1 mg  1 mg Oral Daily Etta Quill, DO   1 mg at 01/11/16 1119  . insulin aspart (novoLOG) injection 0-9 Units  0-9 Units Subcutaneous TID WC Janece Canterbury, MD   1 Units at 01/11/16 1234  . levothyroxine (SYNTHROID, LEVOTHROID) tablet 75 mcg  75 mcg Oral QAC breakfast Etta Quill, DO   75 mcg at 01/11/16 [id number]  .  magnesium sulfate IVPB 4 g 100 mL  4 g Intravenous Once Nishant Dhungel, MD      . metoprolol (LOPRESSOR) injection 5 mg  5 mg Intravenous Q6H PRN Janece Canterbury, MD      . metoprolol tartrate (LOPRESSOR) tablet 100 mg  100 mg Oral BID Etta Quill, DO   100 mg at 01/11/16 1119  . mometasone-formoterol (DULERA) 200-5 MCG/ACT inhaler 2 puff  2 puff Inhalation BID Etta Quill, DO   2 puff at 01/11/16 [id number]  . multivitamin with minerals tablet 1 tablet  1 tablet Oral Daily Etta Quill, DO   1 tablet at 01/11/16 1118  . ondansetron (ZOFRAN) injection 4 mg  4 mg Intravenous Q6H PRN Jolaine Artist, MD      . pantoprazole (PROTONIX) EC tablet 40 mg  40 mg Oral Daily Etta Quill, DO   40 mg at 01/11/16 1119  . potassium chloride  SA (K-DUR,KLOR-CON) CR tablet 40 mEq  40 mEq Oral Daily Janece Canterbury, MD   40 mEq at 01/11/16 [id number]  . potassium chloride SA (K-DUR,KLOR-CON) CR tablet 40 mEq  40 mEq Oral Q4H Nishant Dhungel, MD      . ranolazine (RANEXA) 12 hr tablet 500 mg  500 mg Oral BID Etta Quill, DO   500 mg at 01/11/16 1118  . sildenafil (REVATIO) tablet 20 mg  20 mg Oral TID Jolaine Artist, MD   20 mg at 01/11/16 1117  . sodium chloride flush (NS) 0.9 % injection 3 mL  3 mL Intravenous Q12H Jolaine Artist, MD   3 mL at 01/10/16 1000  . sodium chloride flush (NS) 0.9 % injection 3 mL  3 mL Intravenous PRN Jolaine Artist, MD      . sodium chloride flush (NS) 0.9 % injection 3 mL  3 mL Intravenous Q12H Jolaine Artist, MD   3 mL at 01/10/16 2118  . sodium chloride flush (NS) 0.9 % injection 3 mL  3 mL Intravenous PRN Jolaine Artist, MD      . spironolactone (ALDACTONE) tablet 12.5 mg  12.5 mg Oral Daily Satira Mccallum Tillery, PA-C   12.5 mg at 01/11/16 1117  . sulfaSALAzine (AZULFIDINE) tablet 1,000 mg  1,000 mg Oral BID Etta Quill, DO   1,000 mg at 01/10/16 2117  . tiotropium (SPIRIVA) inhalation capsule 18 mcg  18 mcg Inhalation Daily Nishant Dhungel, MD       . torsemide (DEMADEX) tablet 40 mg  40 mg Oral BID Satira Mccallum Tillery, PA-C   40 mg at 01/11/16 [id number]     Discharge Medications: Please see discharge summary for a list of discharge medications.  Relevant Imaging Results:  Relevant Lab Results:   Additional Information ss# [id number]  Sable Feil, LCSW

## 2016-01-12 DIAGNOSIS — E119 Type 2 diabetes mellitus without complications: Secondary | ICD-10-CM

## 2016-01-12 DIAGNOSIS — G4733 Obstructive sleep apnea (adult) (pediatric): Secondary | ICD-10-CM

## 2016-01-12 DIAGNOSIS — E669 Obesity, unspecified: Secondary | ICD-10-CM

## 2016-01-12 DIAGNOSIS — Z794 Long term (current) use of insulin: Secondary | ICD-10-CM

## 2016-01-12 LAB — BASIC METABOLIC PANEL
ANION GAP: 14 (ref 5–15)
BUN: 25 mg/dL — ABNORMAL HIGH (ref 6–20)
CALCIUM: 7.8 mg/dL — AB (ref 8.9–10.3)
CHLORIDE: 88 mmol/L — AB (ref 101–111)
CO2: 36 mmol/L — AB (ref 22–32)
Creatinine, Ser: 1.21 mg/dL — ABNORMAL HIGH (ref 0.44–1.00)
GFR calc non Af Amer: 43 mL/min — ABNORMAL LOW (ref >60)
GFR, EST AFRICAN AMERICAN: 49 mL/min — AB (ref >60)
Glucose, Bld: 130 mg/dL — ABNORMAL HIGH (ref 65–99)
Potassium: 3.6 mmol/L (ref 3.5–5.1)
Sodium: 138 mmol/L (ref 135–145)

## 2016-01-12 LAB — MAGNESIUM: Magnesium: 1.8 mg/dL (ref 1.7–2.4)

## 2016-01-12 LAB — GLUCOSE, CAPILLARY
GLUCOSE-CAPILLARY: 113 mg/dL — AB (ref 65–99)
GLUCOSE-CAPILLARY: 151 mg/dL — AB (ref 65–99)

## 2016-01-12 MED ORDER — DIPHENHYDRAMINE-APAP (SLEEP) 25-500 MG PO TABS: 1.0000 | ORAL_TABLET | Freq: Every evening | ORAL | Status: DC | PRN

## 2016-01-12 MED ORDER — SPIRONOLACTONE 25 MG PO TABS: 12.5000 mg | ORAL_TABLET | Freq: Every day | ORAL | Status: DC

## 2016-01-12 MED ORDER — MAGNESIUM SULFATE 2 GM/50ML IV SOLN
2.0000 g | Freq: Once | INTRAVENOUS | Status: AC
  Administered 2016-01-12: 2 g via INTRAVENOUS
  Filled 2016-01-12 (×2): qty 50

## 2016-01-12 MED ORDER — POTASSIUM CHLORIDE CRYS ER 20 MEQ PO TBCR: 40.0000 meq | EXTENDED_RELEASE_TABLET | Freq: Every day | ORAL | Status: DC

## 2016-01-12 MED ORDER — TORSEMIDE 20 MG PO TABS: 40.0000 mg | ORAL_TABLET | Freq: Two times a day (BID) | ORAL | Status: DC

## 2016-01-12 MED ORDER — INSULIN ASPART 100 UNIT/ML ~~LOC~~ SOLN: SUBCUTANEOUS | Status: DC

## 2016-01-12 MED ORDER — SILDENAFIL CITRATE 20 MG PO TABS: 20.0000 mg | ORAL_TABLET | Freq: Three times a day (TID) | ORAL | Status: DC

## 2016-01-12 NOTE — Discharge Summary (Signed)
Discharge Summary  [name] [id number] DOB: 12-11-1940  PCP: [name], MD  Admit date: 01/05/2016 Discharge date: 01/12/2016  Time spent: >2mins  Recommendations for Outpatient Follow-up:  1. F/u with PMD within a week  for hospital discharge follow up, repeat cbc/bmp at follow up 2. F/u with cardiology Dr Haroldine Laws on 4/26  Discharge Diagnoses:  Active Hospital Problems   Diagnosis Date Noted  . RVF (right ventricular failure) ([place])   . Hypokalemia   . Chronic atrial fibrillation ([place])   . Pulmonary hypertension ([place]), RHC on 01/09/2016 01/09/2016  . AKI (acute kidney injury) ([place]) 10/16/2015  . CKD (chronic kidney disease) stage 3, GFR 30-59 ml/min 03/13/2015  . Type 2 diabetes mellitus with diabetic nephropathy ([place]) 03/13/2015  . OSA (obstructive sleep apnea) 02/18/2015  . Obesity 11/10/2014  . Atrial fibrillation ([place]) 12/18/2007    Resolved Hospital Problems   Diagnosis Date Noted Date Resolved  No resolved problems to display.    Discharge Condition: stable  Diet recommendation: heart healthy/carb modified  Filed Weights   01/07/16 2016 01/09/16 0407 01/10/16 2023  Weight: 127.914 kg (282 lb) 115.4 kg (254 lb 6.6 oz) 115.4 kg (254 lb 6.6 oz)    History of present illness:   75 y.o. female with h/o COPD on 2L home O2 at night, type 2 diabetes mellitus, paroxysmal A. fib on Eliquis, parathyroidectomy 4 months back presents to the ED with c/o increasing fatigue, generalized weakness, SOB, cough. She discontinued lasix 4 days ago due to poor UOP with nausea, vomiting, and bloating. Denies leg swellings, orthopnea or PND.  Initially, she was thought to have CAP with wheezing from COPD exacerbation, but did not improve with steroids and antibiotics.  Upon further evaluation she had acute CHF. Cardiology consulted and underwent right heart catheterization which showed right heart failure and pulmonary arterial hypertension.  Hospital Course:    Principal Problem:   RVF (right ventricular failure) ([place]) Active Problems:   Atrial fibrillation (H[facility])   OSA (obstructive sleep apnea)   CKD (chronic kidney disease) stage 3, GFR 30-59 ml/min   Obesity   Type 2 diabetes mellitus with diabetic nephropathy ([facility])   AKI (acute kidney injury) ([place])   Pulmonary hypertension ([place]), RHC on 01/09/2016   Chronic atrial fibrillation (H[facility])   Hypokalemia  Acute on chronic respiratory failure secondary to right middle failure and pulmonary artery hypertension Suspect due to untreated OSA/ OHS. ? Cor pulmonale. Started on sildenafil. diuresing well.  Switched to [employer] and added aldactone. Patient will need sleep study as outpatient. (Can be arranged either by her PCP or by her pulmonologist Dr. Chase Caller) Outpatient cardiology follow up   Acute on chronic kidney disease stage III Suspect cardiorenal syndrome. Improved with diuretics.  ARB held since admission, did not restart due to low normal bp and cr elevation,   Severe hypokalemia/hypomagnesemia Replenished. Repeat labs at follow up  Diabetes mellitus type II Stable. Continue sliding scale coverage.  Paroxysmal A. Fib Continue Eliquis  Hypothyroidism Continue Synthroid  CAD Stable. Continue beta blocker, ranolazine and statin.  ?OSA Daughter informs excessive daytime sleepiness. Recommend outpatient sleep study.  Diet: Heart healthy/diabetic   H/o crohn's disease on sulfasalazine, she sees [facility] Dr Oletta Lamas for this.  DVT prophylaxis: On Eliquis   Code Status: Full code Family Communication: patient Disposition Plan: SNF on 4/14  Consultants:  Dr. Haroldine Laws  Procedures:  2-D echo  Right heart catheterization  Antibiotics:  None  Discharge Exam: BP 112/62 mmHg  Pulse 89  Temp(Src)  98.5 F (36.9 C) (Oral)  Resp 16  Ht 5\' 11"  (1.803 m)  Wt 115.4 kg (254 lb 6.6 oz)  BMI 35.50 kg/m2  SpO2 92%   General: Elderly female appears fatigued, not in  distress  HEENT: No pallor, moist mucosa, supple neck  Cardiovascular: Normal S1 and S2, no murmurs rub or gallop  Respiratory: Fine bibasilar crackles   Abdomen: Soft, nondistended, nontender, bowel sounds present  Musculoskeletal: Warm, trace edema  CNS: Alert and oriented   Discharge Instructions You were cared for by a hospitalist during your hospital stay. If you have any questions about your discharge medications or the care you received while you were in the hospital after you are discharged, you can call the unit and asked to speak with the hospitalist on call if the hospitalist that took care of you is not available. Once you are discharged, your primary care physician will handle any further medical issues. Please note that NO REFILLS for any discharge medications will be authorized once you are discharged, as it is imperative that you return to your primary care physician (or establish a relationship with a primary care physician if you do not have one) for your aftercare needs so that they can reassess your need for medications and monitor your lab values.  Discharge Instructions    Diet - low sodium heart healthy    Complete by:  As directed      Increase activity slowly    Complete by:  As directed             Medication List    STOP taking these medications        furosemide 40 MG tablet  Commonly known as:  LASIX     insulin glargine 100 UNIT/ML injection  Commonly known as:  LANTUS     loperamide 2 MG tablet  Commonly known as:  IMODIUM A-D     losartan 50 MG tablet  Commonly known as:  COZAAR     omeprazole 20 MG tablet  Commonly known as:  PRILOSEC OTC      TAKE these medications        albuterol 108 (90 Base) MCG/ACT inhaler  Commonly known as:  PROVENTIL HFA;VENTOLIN HFA  Inhale 2 puffs into the lungs every 6 (six) hours as needed for wheezing or shortness of breath.     atorvastatin 80 MG tablet  Commonly known as:  LIPITOR  Take 80 mg by  mouth at bedtime.     colchicine 0.6 MG tablet  Take 1-2 tablets by mouth daily as needed (gout). TAKE 2 TABLETS BY MOUTH AS ONE DOSE, THEN 1 TABLET IN 1 HOUR FOR ACUTE GOUT EPISODE     diphenhydramine-acetaminophen 25-500 MG Tabs tablet  Commonly known as:  TYLENOL PM  Take 1 tablet by mouth at bedtime as needed (sleep/pain).     ELIQUIS 5 MG Tabs tablet  Generic drug:  apixaban  TAKE 1 TABLET BY MOUTH TWICE A DAY     Fiber Caps  Take 1 tablet by mouth at bedtime.     Fluticasone-Salmeterol 250-50 MCG/DOSE Aepb  Commonly known as:  ADVAIR  Inhale 1 puff into the lungs every 12 (twelve) hours.     folic acid 1 MG tablet  Commonly known as:  FOLVITE  Take 1 mg by mouth daily.     insulin aspart 100 UNIT/ML injection  Commonly known as:  novoLOG  Before each meal 3 times a day, 140-199 - 2 units,  200-250 - 4 units, 251-299 - 6 units,  300-349 - 8 units,  350 or above 10 units. Call MD if blood sugar >400 Insulin PEN if approved, provide syringes and needles if needed.     levothyroxine 75 MCG tablet  Commonly known as:  SYNTHROID, LEVOTHROID  Take 75 mcg by mouth daily.     metoprolol 100 MG tablet  Commonly known as:  LOPRESSOR  TAKE 1 TABLET BY MOUTH TWICE A DAY     multivitamin with minerals tablet  Take 1 tablet by mouth daily.     nitroGLYCERIN 0.4 MG SL tablet  Commonly known as:  NITROSTAT  Place 1 tablet (0.4 mg total) under the tongue every 5 (five) minutes as needed for chest pain.     potassium chloride SA 20 MEQ tablet  Commonly known as:  K-DUR,KLOR-CON  Take 2 tablets (40 mEq total) by mouth daily.     RANEXA 500 MG 12 hr tablet  Generic drug:  ranolazine  TAKE 1 TABLET BY MOUTH TWICE DAILY     sildenafil 20 MG tablet  Commonly known as:  REVATIO  Take 1 tablet (20 mg total) by mouth 3 (three) times daily.     spironolactone 25 MG tablet  Commonly known as:  ALDACTONE  Take 0.5 tablets (12.5 mg total) by mouth daily.     sulfaSALAzine 500 MG  tablet  Commonly known as:  AZULFIDINE  Take 1,000 mg by mouth 2 (two) times daily.     Tiotropium Bromide Monohydrate 2.5 MCG/ACT Aers  Commonly known as:  SPIRIVA RESPIMAT  Inhale 2 puffs into the lungs daily.     torsemide 20 MG tablet  Commonly known as:  DEMADEX  Take 2 tablets (40 mg total) by mouth 2 (two) times daily.     Vitamin D (Ergocalciferol) 50000 units Caps capsule  Commonly known as:  DRISDOL  Take 50,000 Units by mouth every Sunday.       No Known Allergies     Follow-up Information    Follow up with Glori Bickers, MD On 01/24/2016.   Specialty:  Cardiology   Why:  at 300 pm for post hospital follow up. Please bring all of your medications to your visit. The code for parking is 2000.   Contact information:   [place] [address] [phone]       Follow up with [name], MD In 1 week.   Specialty:  Family Medicine   Why:  hospital discharge follow up, repeat cbc/bmp at follow up, monitor blood sugar   Contact information:   [address] [place] [address] [phone]        The results of significant diagnostics from this hospitalization (including imaging, microbiology, ancillary and laboratory) are listed below for reference.    Significant Diagnostic Studies: Dg Chest Port 1 View  01/07/2016  CLINICAL DATA:  Elevated heart rate. Fatigue, shortness of breath and cough. EXAM: PORTABLE CHEST 1 VIEW COMPARISON:  01/05/2016 FINDINGS: There is a left chest wall pacer device with leads in the right atrial appendage right ventricle. Moderate cardiac enlargement. Bilateral pleural effusions are noted left greater right. Mild interstitial edema. IMPRESSION: 1. Cardiac enlargement and CHF. Electronically Signed   By: Kerby Moors M.D.   On: 01/07/2016 12:08   Dg Chest Port 1 View  01/05/2016  CLINICAL DATA:  Respiratory distress. EXAM: PORTABLE CHEST 1 VIEW COMPARISON:  11/01/2015 FINDINGS:  Stable appearance of pacemaker. The heart  is mildly enlarged and stable in appearance. Bilateral lower lung densities present, left greater than right, consistent with atelectasis versus infiltrates. No overt pulmonary edema. IMPRESSION: Stable cardiomegaly. Bilateral lower lung atelectasis versus infiltrates, left greater than right. Electronically Signed   By: Aletta Edouard M.D.   On: 01/05/2016 19:37    Microbiology: Recent Results (from the past 240 hour(s))  Culture, blood (routine x 2)     Status: None   Collection Time: 01/05/16  5:45 PM  Result Value Ref Range Status   Specimen Description BLOOD LEFT ANTECUBITAL  Final   Special Requests IN PEDIATRIC BOTTLE [facility]  Final   Culture NO GROWTH 5 DAYS  Final   Report Status 01/10/2016 FINAL  Final  Culture, blood (routine x 2)     Status: None   Collection Time: 01/05/16  5:55 PM  Result Value Ref Range Status   Specimen Description BLOOD LEFT FOREARM  Final   Special Requests IN PEDIATRIC BOTTLE 3CC  Final   Culture NO GROWTH 5 DAYS  Final   Report Status 01/10/2016 FINAL  Final     Labs: Basic Metabolic Panel:  Recent Labs Lab 01/05/16 1835  01/09/16 0437 01/10/16 0822 01/11/16 0532 01/11/16 1259 01/12/16 0529  NA  --   < > 137 141 137 139 138  K  --   < > 3.4* 3.5 2.6* 3.0* 3.6  CL  --   < > 93* 91* 86* 86* 88*  CO2  --   < > 27 38* 35* 36* 36*  GLUCOSE  --   < > 112* 115* 173* 199* 130*  BUN  --   < > 34* 30* 29* 28* 25*  CREATININE  --   < > 1.88* 1.65* 1.35* 1.38* 1.21*  CALCIUM  --   < > 8.5* 8.3* 7.8* 7.8* 7.8*  MG  --   --   --   --  1.3*  --  1.8  PHOS 4.5  --   --   --   --   --   --   < > = values in this interval not displayed. Liver Function Tests:  Recent Labs Lab 01/05/16 1835  AST 29  ALT 14  ALKPHOS 113  BILITOT 1.1  PROT 6.4*  ALBUMIN 2.9*    Recent Labs Lab 01/05/16 1835  LIPASE 21   No results for input(s): AMMONIA in the last 168 hours. CBC:  Recent Labs Lab 01/05/16 1623  01/05/16 1835 01/06/16 0609 01/07/16 0720 01/08/16 0442 01/09/16 0437  WBC 7.9  --  5.2 8.5 7.9 8.5  NEUTROABS  --  75.0*  --   --   --   --   HGB 16.0*  --  15.3* 15.8* 15.6* 15.3*  HCT 51.0*  --  49.5* 49.6* 50.4* 47.6*  MCV 100.0  --  100.0 101.6* 101.4* 99.0  PLT 339  --  309 356 306 329   Cardiac Enzymes: No results for input(s): CKTOTAL, CKMB, CKMBINDEX, TROPONINI in the last 168 hours. BNP: BNP (last 3 results)  Recent Labs  10/27/15 1317 01/05/16 1745 01/08/16 0442  BNP 1318.1* 844.9* 790.0*    ProBNP (last 3 results) No results for input(s): PROBNP in the last 8760 hours.  CBG:  Recent Labs Lab 01/11/16 1138 01/11/16 1641 01/11/16 2112 01/12/16 0731 01/12/16 1153  GLUCAP 133* 176* 228* 113* 151*       Signed:  Annaleia Pence MD, PhD  [facility] 01/12/2016, 2:26 PM

## 2016-01-12 NOTE — Progress Notes (Signed)
Physical Therapy Treatment Patient Details Name: [name] MRN: [id number] DOB: 05/29/1941 Today's Date: 01/12/2016    History of Present Illness 75 year old female PMH of Chronic combined systolic and diastolic CHF, morbid obesity, HLD, tachybradycardia syndrome status post PPM, COPD, depression, GERD, hypothyroid, A. fib, DM, CAD, OSA-not on CPAP, chronic respiratory failure on home oxygen 2 L/m at bedtime, recent parathyroidectomy, recent hospitalization 10/10/15-10/13/15 for acute on chronic combined CHF, A. fib with RVR and symptomatic hypocalcemia from recent parathyroidectomy,. Admitted for Acute on chronic respiratory failure possibly secondary to COPD exacerbation and CAP    PT Comments    Pt is starting to progress with gait into the hallway.  She did much better today with the rollator because it moves with less effort than the RW.  She was able to walk on RA and maintain sats 90-92% while walking.  HR 110s.  Pt reports she may d/c to SNF for rehab today.  PT to follow acutely until d/c confirmed.     Follow Up Recommendations  SNF     Equipment Recommendations  None recommended by PT    Recommendations for Other Services   NA     Precautions / Restrictions Precautions Precautions: Fall Precaution Comments: monitor O2 Restrictions Weight Bearing Restrictions: No    Mobility  Bed Mobility               General bed mobility comments: Pt OOB in recliner chair  Transfers Overall transfer level: Needs assistance Equipment used: 4-wheeled walker Transfers: Sit to/from Stand Sit to Stand: Min guard         General transfer comment: Min guard assist to get to standing from both rollator and from recliner chair.  Veral cues for safety (lock breaks) and hand placement .   Ambulation/Gait Ambulation/Gait assistance: Min guard Ambulation Distance (Feet): 65 Feet (with three seated rest breaks) Assistive device: 4-wheeled walker Gait Pattern/deviations:  Step-through pattern;Trunk flexed Gait velocity: decreased Gait velocity interpretation: Below normal speed for age/gender General Gait Details: Verbal cues for upright posture.  Started with 2 L O2 [place] and was able to walk maintaining sats in the low 90s on RA for second half of walk.  HR in 110s durin gait.           Balance Overall balance assessment: Needs assistance Sitting-balance support: Feet supported;No upper extremity supported Sitting balance-Leahy Scale: Normal     Standing balance support: Bilateral upper extremity supported Standing balance-Leahy Scale: Fair Standing balance comment: Pt likes to lean on thigs for support                    Cognition Arousal/Alertness: Awake/alert Behavior During Therapy: Flat affect Overall Cognitive Status: Within Functional Limits for tasks assessed                      Exercises General Exercises - Lower Extremity Ankle Circles/Pumps: AROM;Both;20 reps;Supine (encouraged APs and toe wiggles to help with edema management)        Pertinent Vitals/Pain Pain Assessment: Faces Faces Pain Scale: Hurts little more Pain Location: bil feet due to swelling Pain Descriptors / Indicators: Burning Pain Intervention(s): Limited activity within patient's tolerance;Monitored during session;Repositioned (elevated feet at end of session)           PT Goals (current goals can now be found in the care plan section) Acute Rehab PT Goals Patient Stated Goal: go to rehab  Progress towards PT goals: Progressing toward goals  Frequency  Min 2X/week    PT Plan Frequency needs to be updated       End of Session Equipment Utilized During Treatment: Oxygen Activity Tolerance: Patient limited by fatigue Patient left: in chair;with call bell/phone within reach     Time: 1127-1149 PT Time Calculation (min) (ACUTE ONLY): 22 min  Charges:  $Gait Training: 8-22 mins                      Nyiesha Beever B. [place], [place],  DPT [phone]   01/12/2016, 2:22 PM

## 2016-01-12 NOTE — Progress Notes (Signed)
Pt IV was dc'd. Pt was escorted out via wheelchair by NT and son in law is to transport pt to [facility].

## 2016-01-12 NOTE — Progress Notes (Signed)
Attempted to call report to [facility] x3. 1st time I was left on hold for >7 minutes, the 2nd time I was hung up on, the 3rd time I was put on hold for > 16 minutes. Finally answered on 4th call.

## 2016-01-14 NOTE — Anesthesia Postprocedure Evaluation (Signed)
Anesthesia Post Note  Patient: [name]  Procedure(s) Performed: Procedure(s) (LRB): LEFT PARATHYROID EXPLORATION AND  PARATHYROIDECTOMY (Left)  Patient location during evaluation: PACU Anesthesia Type: General Level of consciousness: awake and alert and awake Pain management: pain level controlled Vital Signs Assessment: post-procedure vital signs reviewed and stable Respiratory status: spontaneous breathing, nonlabored ventilation and respiratory function stable Cardiovascular status: blood pressure returned to baseline Anesthetic complications: no    Last Vitals:  Filed Vitals:   10/04/15 0130 10/04/15 0620  BP: 126/72 102/56  Pulse: 78 70  Temp: 36.5 C 36.4 C  Resp: 20 20    Last Pain:  Filed Vitals:   10/04/15 0839  PainSc: 4                  Roselie Cirigliano COKER

## 2016-01-24 ENCOUNTER — Ambulatory Visit (HOSPITAL_COMMUNITY)
Admit: 2016-01-24 | Discharge: 2016-01-24 | Disposition: A | Discharge status: Home / Self Care | Payer: Medicare Other | Source: Ambulatory Visit | Attending: Internal Medicine | Admitting: Internal Medicine

## 2016-01-24 ENCOUNTER — Encounter (HOSPITAL_COMMUNITY): Payer: Self-pay | Admitting: Internal Medicine

## 2016-01-24 VITALS — BP 122/86 | HR 92 | Wt 240.1 lb

## 2016-01-24 DIAGNOSIS — Z794 Long term (current) use of insulin: Secondary | ICD-10-CM | POA: Insufficient documentation

## 2016-01-24 DIAGNOSIS — I5032 Chronic diastolic (congestive) heart failure: Secondary | ICD-10-CM | POA: Diagnosis not present

## 2016-01-24 DIAGNOSIS — E119 Type 2 diabetes mellitus without complications: Secondary | ICD-10-CM | POA: Diagnosis not present

## 2016-01-24 DIAGNOSIS — M109 Gout, unspecified: Secondary | ICD-10-CM | POA: Insufficient documentation

## 2016-01-24 DIAGNOSIS — I482 Chronic atrial fibrillation, unspecified: Secondary | ICD-10-CM

## 2016-01-24 DIAGNOSIS — I2510 Atherosclerotic heart disease of native coronary artery without angina pectoris: Secondary | ICD-10-CM | POA: Diagnosis not present

## 2016-01-24 DIAGNOSIS — E785 Hyperlipidemia, unspecified: Secondary | ICD-10-CM | POA: Insufficient documentation

## 2016-01-24 DIAGNOSIS — G4733 Obstructive sleep apnea (adult) (pediatric): Secondary | ICD-10-CM | POA: Diagnosis not present

## 2016-01-24 DIAGNOSIS — K5090 Crohn's disease, unspecified, without complications: Secondary | ICD-10-CM | POA: Insufficient documentation

## 2016-01-24 DIAGNOSIS — Z7901 Long term (current) use of anticoagulants: Secondary | ICD-10-CM | POA: Insufficient documentation

## 2016-01-24 DIAGNOSIS — Z87891 Personal history of nicotine dependence: Secondary | ICD-10-CM | POA: Insufficient documentation

## 2016-01-24 DIAGNOSIS — I739 Peripheral vascular disease, unspecified: Secondary | ICD-10-CM | POA: Diagnosis not present

## 2016-01-24 DIAGNOSIS — I495 Sick sinus syndrome: Secondary | ICD-10-CM | POA: Diagnosis not present

## 2016-01-24 DIAGNOSIS — J449 Chronic obstructive pulmonary disease, unspecified: Secondary | ICD-10-CM | POA: Diagnosis not present

## 2016-01-24 DIAGNOSIS — I4892 Unspecified atrial flutter: Secondary | ICD-10-CM | POA: Diagnosis not present

## 2016-01-24 DIAGNOSIS — Z950 Presence of cardiac pacemaker: Secondary | ICD-10-CM | POA: Diagnosis not present

## 2016-01-24 DIAGNOSIS — Z79899 Other long term (current) drug therapy: Secondary | ICD-10-CM | POA: Diagnosis not present

## 2016-01-24 DIAGNOSIS — K219 Gastro-esophageal reflux disease without esophagitis: Secondary | ICD-10-CM | POA: Diagnosis not present

## 2016-01-24 DIAGNOSIS — I428 Other cardiomyopathies: Secondary | ICD-10-CM | POA: Insufficient documentation

## 2016-01-24 DIAGNOSIS — Z825 Family history of asthma and other chronic lower respiratory diseases: Secondary | ICD-10-CM | POA: Diagnosis not present

## 2016-01-24 DIAGNOSIS — E039 Hypothyroidism, unspecified: Secondary | ICD-10-CM | POA: Diagnosis not present

## 2016-01-24 DIAGNOSIS — Z8249 Family history of ischemic heart disease and other diseases of the circulatory system: Secondary | ICD-10-CM | POA: Insufficient documentation

## 2016-01-24 DIAGNOSIS — I5040 Unspecified combined systolic (congestive) and diastolic (congestive) heart failure: Secondary | ICD-10-CM | POA: Diagnosis not present

## 2016-01-24 LAB — BASIC METABOLIC PANEL
ANION GAP: 13 (ref 5–15)
BUN: 24 mg/dL — ABNORMAL HIGH (ref 6–20)
CO2: 26 mmol/L (ref 22–32)
Calcium: 7 mg/dL — ABNORMAL LOW (ref 8.9–10.3)
Chloride: 94 mmol/L — ABNORMAL LOW (ref 101–111)
Creatinine, Ser: 1.57 mg/dL — ABNORMAL HIGH (ref 0.44–1.00)
GFR calc Af Amer: 36 mL/min — ABNORMAL LOW (ref >60)
GFR, EST NON AFRICAN AMERICAN: 31 mL/min — AB (ref >60)
GLUCOSE: 168 mg/dL — AB (ref 65–99)
POTASSIUM: 4.2 mmol/L (ref 3.5–5.1)
Sodium: 133 mmol/L — ABNORMAL LOW (ref 135–145)

## 2016-01-24 NOTE — Patient Instructions (Signed)
Follow-up in 4 weeks

## 2016-01-24 NOTE — Progress Notes (Signed)
Patient ID: [name], female   DOB: 06-24-1941, 75 y.o.   MRN: [id number]    Advanced Heart Failure Clinic Note   Primary Care: Dibas Koirala, MD Primary Cardiologist: Dr Klein/Dr Burt Knack  HPI:  [name] is a 75 y.o. female with history of chronic combined systolic/diastolic HF, morbid obesity, HLD, tachy/[name] s/p PPM, COPD, GERD, A fib, DM, CAD, and COPD on home 02. She had LHC in September 2013 which showed severe stenosis of her LAD and normal LV function.  Echo 01/08/16 LVEF 40-45%. RV read as "mildly dilated" and no function evaluation. On review it is severely dilated with mod/severely reduced function. Previous Echo in 10/2015 with No EF read but RV moderately dilated with mod/severely reduced function.   Presented to the ED on 01/15/16 with SOB, weakness, and cough x 1 week. Had stopped lasix 4 days prior to admission with decreased urine output. Treated for COPD exacerbation and CAP.   Started on revatio 20 mg TID for RV failure. Discharge weight 254 lbs.    She presents today for post hospital follow up. States she feels better than when she left the hospital, but not as good as after her previous admission. Just can't do as much independently as she wants to. She is down 14 lbs from discharge. Feels better on oxygen, which she is now wearing all the time. Leg edema has improved. No longer SOB with ADLs.  Working with physical therapy, can walk across the room at [facility] without SOB (while on 02). States she didn't urinate much when she first went home, but it has really picked up. Feels like she is peeing like a "normal" person, as opposed to decrease output. Isn't sure what medicine she is taking.   RHC 01/09/16 RA = 15 RV = 52/8/12 PA = 54/28 (39) PCW = 13 Fick cardiac output/index = 3.5/1.5 Thermo CO/CI = 4.4/1.9 PVR = 5.9 WU Ao sat = 95% PA sat = 54%, 51%  Labs 01/12/16  K 3.6, Creatinine 1.21   Past Medical History  Diagnosis Date  . Nonischemic  cardiomyopathy ([place])     history of,  EF 20-25% at left heart cath in 06/2007; echo 2011 had normal LV function  . Chronic diastolic heart failure ([place])     Echo 06/2010: Moderate LVH, EF 55-65%, normal wall motion, mild MR, moderate to severe LAE, mild RAE.   . Morbid obesity ([place])   . Hyperlipidemia   . 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  explanted in 2010 -- [place] pacemaker in 2010.  Marland Kitchen Chronic obstructive pulmonary disease ([place])   . Osteoporosis   . Depression   . GERD (gastroesophageal reflux disease)   . Crohn's disease ([place])   . Hypothyroidism     treated  . Seasonal allergies   . Pacemaker     Medtronic  . Atrial fibrillation ([place])     Status post pulmonary vein isolation 2009 at [facility]  . CAD (coronary artery disease)     LHC 05/2007: pLAD 70-80%, pRCA 40%, EF 20-25%. LAD lesion treated medically.   . Coronary artery disease 06/15/2012  . Atrial fibrillation ([place]) 12/18/2007    Annotation: refractory Qualifier: Diagnosis of  By: Doy Mince LPN, Megan    . Morbid obesity with BMI of 40.0-44.9, adult ([place]) 06/22/2012  . Diabetes mellitus ([place]) 06/22/2012  . Diabetes mellitus without complication ([place])   . COPD (chronic obstructive pulmonary  disease) ([place])   . Gout   . Dysrhythmia     atrial fib  . Peripheral vascular disease (HCC) 15    rt arm clots  . Obstructive sleep apnea     continuous positive airway pressure not using at present  . Shortness of breath dyspnea     exersion  . CHF (congestive heart failure) ([place])   . On home oxygen therapy     "2L at night" (10/03/2015)    Current Outpatient Prescriptions  Medication Sig Dispense Refill  . albuterol (PROVENTIL HFA;VENTOLIN HFA) 108 (90 BASE) MCG/ACT inhaler Inhale 2 puffs into the lungs every 6 (six) hours as needed for wheezing or shortness of breath.    Marland Kitchen atorvastatin (LIPITOR) 80 MG tablet Take 80 mg by mouth at bedtime.       . colchicine 0.6 MG tablet Take 1-2 tablets by mouth daily as needed (gout). TAKE 2 TABLETS BY MOUTH AS ONE DOSE, THEN 1 TABLET IN 1 HOUR FOR ACUTE GOUT EPISODE  2  . diphenhydramine-acetaminophen (TYLENOL PM) 25-500 MG TABS tablet Take 1 tablet by mouth at bedtime as needed (sleep/pain). 14 tablet 0  . ELIQUIS 5 MG TABS tablet TAKE 1 TABLET BY MOUTH TWICE A DAY 60 tablet 5  . Fiber CAPS Take 1 tablet by mouth at bedtime.     . Fluticasone-Salmeterol (ADVAIR) 250-50 MCG/DOSE AEPB Inhale 1 puff into the lungs every 12 (twelve) hours. 60 each 6  . folic acid (FOLVITE) 1 MG tablet Take 1 mg by mouth daily.  12  . insulin aspart (NOVOLOG) 100 UNIT/ML injection Before each meal 3 times a day, 140-199 - 2 units, 200-250 - 4 units, 251-299 - 6 units,  300-349 - 8 units,  350 or above 10 units. Call MD if blood sugar >400 Insulin PEN if approved, provide syringes and needles if needed. 10 mL 11  . levothyroxine (SYNTHROID, LEVOTHROID) 75 MCG tablet Take 75 mcg by mouth daily.    . metoprolol (LOPRESSOR) 100 MG tablet TAKE 1 TABLET BY MOUTH TWICE A DAY 180 tablet 2  . Multiple Vitamins-Minerals (MULTIVITAMIN WITH MINERALS) tablet Take 1 tablet by mouth daily.    . nitroGLYCERIN (NITROSTAT) 0.4 MG SL tablet Place 1 tablet (0.4 mg total) under the tongue every 5 (five) minutes as needed for chest pain. 25 tablet 1  . potassium chloride SA (K-DUR,KLOR-CON) 20 MEQ tablet Take 2 tablets (40 mEq total) by mouth daily. 30 tablet 0  . RANEXA 500 MG 12 hr tablet TAKE 1 TABLET BY MOUTH TWICE DAILY 180 tablet 1  . sildenafil (REVATIO) 20 MG tablet Take 1 tablet (20 mg total) by mouth 3 (three) times daily. 10 tablet 0  . spironolactone (ALDACTONE) 25 MG tablet Take 0.5 tablets (12.5 mg total) by mouth daily. 30 tablet 0  . sulfaSALAzine (AZULFIDINE) 500 MG tablet Take 1,000 mg by mouth 2 (two) times daily.  11  . Tiotropium Bromide Monohydrate (SPIRIVA RESPIMAT) 2.5 MCG/ACT AERS Inhale 2 puffs into the lungs daily.  4 g 6  . torsemide (DEMADEX) 20 MG tablet Take 2 tablets (40 mg total) by mouth 2 (two) times daily. 60 tablet 0  . Vitamin D, Ergocalciferol, (DRISDOL) 50000 units CAPS capsule Take 50,000 Units by mouth every Sunday.  1  . [DISCONTINUED] allopurinol (ZYLOPRIM) 100 MG tablet Take 100 mg by mouth daily.       No current facility-administered medications for this encounter.    No Known Allergies    Social History  Social History  . Marital Status: Divorced    Spouse Name: N/A  . Number of Children: N/A  . Years of Education: N/A   Occupational History  . retired    Social History Main Topics  . Smoking status: Former Smoker -- 3.00 packs/day for 32 years    Quit date: 09/30/1986  . Smokeless tobacco: Former Systems developer    Quit date: 08/15/1987  . Alcohol Use: No  . Drug Use: No  . Sexual Activity: No   Other Topics Concern  . Not on file   Social History Narrative   ** Merged History Encounter **          Family History  Problem Relation Age of Onset  . Breast cancer Mother   . Heart disease Father   . Emphysema Father     Filed Vitals:   01/24/16 1506  BP: 122/86  Pulse: 92  Weight: 240 lb 1.9 oz (108.918 kg)  SpO2: 95%    PHYSICAL EXAM: General: Elderly appearing HEENT: normal Neck: supple. JVP difficult due to pt size, but appears 8-9 cm. Carotids 2+ bilat; no bruits. No thyromegaly or lymphadenopathy noted. Cor: PMI nondisplaced. Irregularly irregular. No M/G/R appreciated Lungs: Mild basilar crackles, scattered rhonchi Abdomen: morbidly obese, soft, non-tender, non-distended, no HSM. No bruits or masses. +BS  Extremities: no clubbing, rash. Legs warm with good color. Trace-1+ LE edema Neuro: alert & orientedx3, cranial nerves grossly intact. moves all 4 extremities w/o difficulty. Affect pleasant  ASSESSMENT & PLAN:  1. Combined systolic diastolic CHF - Echo [id number] LVEF 40-45% RV severely dilated and reduced. 2. Tachy-[name] syndrome s/p Medtronic  PPM 3. Aflutter 4. CAD 5. DM 6. COPD on chronic 02 2L at night.  7. Chronic anticoagulation on Eliquis 8. Deconditioning 9. Morbid obesity  She is down 14 lbs from discharge and seems to be doing better on chronic 02 (as opposed to only nightly). She should continue working with PT and increase activity as able.  She is still very limited by her deconditioning.   Continue torsemide 40 mg BID. Check BMET today.  Continue sildenafil 20 mg TID. Medications confirmed with nurse at [facility] by HF Nurse Navigator, Carole Binning, RN.  Follow up 4 weeks. PA/NP side OK.   Shirley Friar, PA-C 01/24/2016   Patient seen and examined with [name], PA-C. We discussed all aspects of the encounter. I agree with the assessment and plan as stated above.   Overall much improved. Weight continue to come down but still with mild overload. Continue with sildenafil for PAH. Continue to work with PT at [facility] check labs to da to make sure we aren't overdiuresing her.   Alfa Leibensperger,MD 11:52 PM

## 2016-01-31 IMAGING — CR DG CHEST 2V
2 series · 2 of 2 positions shown · non-contrast
Comparison: 01/30/2015.  Also 04/25/2014.

CLINICAL DATA: New shortness of breath with weakness. No other
chest complaints.

EXAM:
CHEST  2 VIEW

[x chest ap]
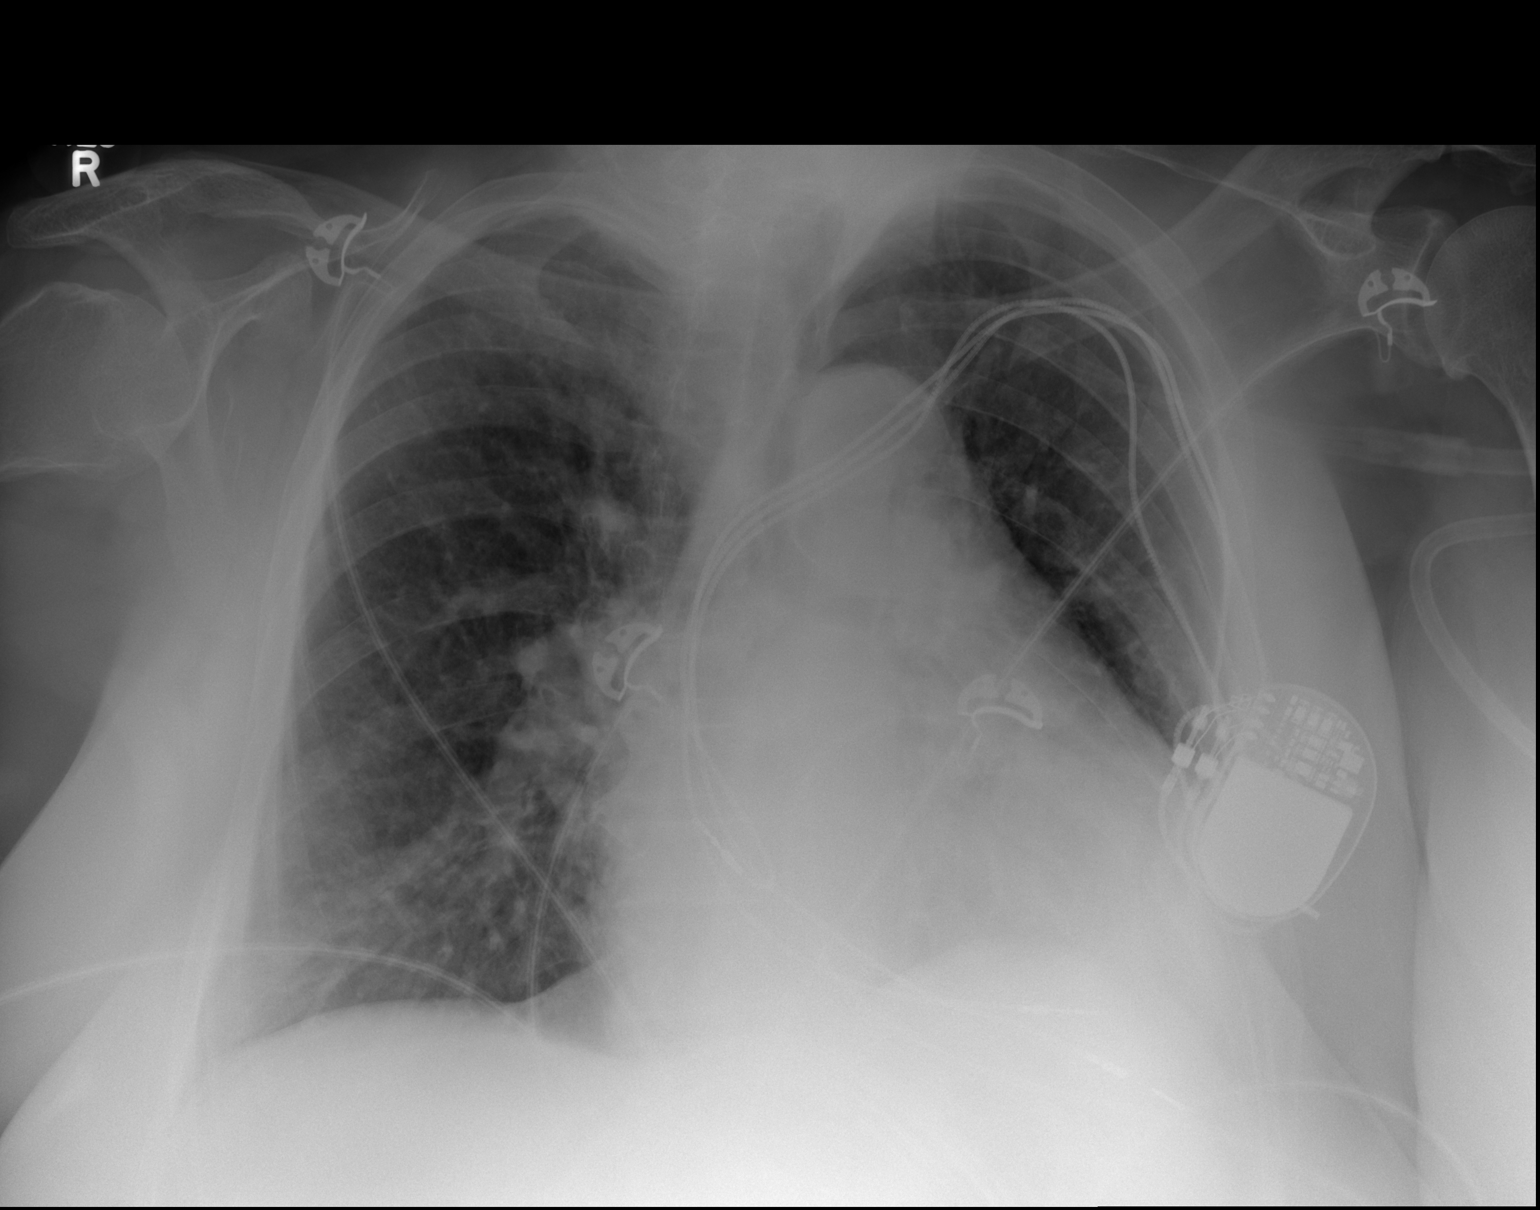

[w chest lat]
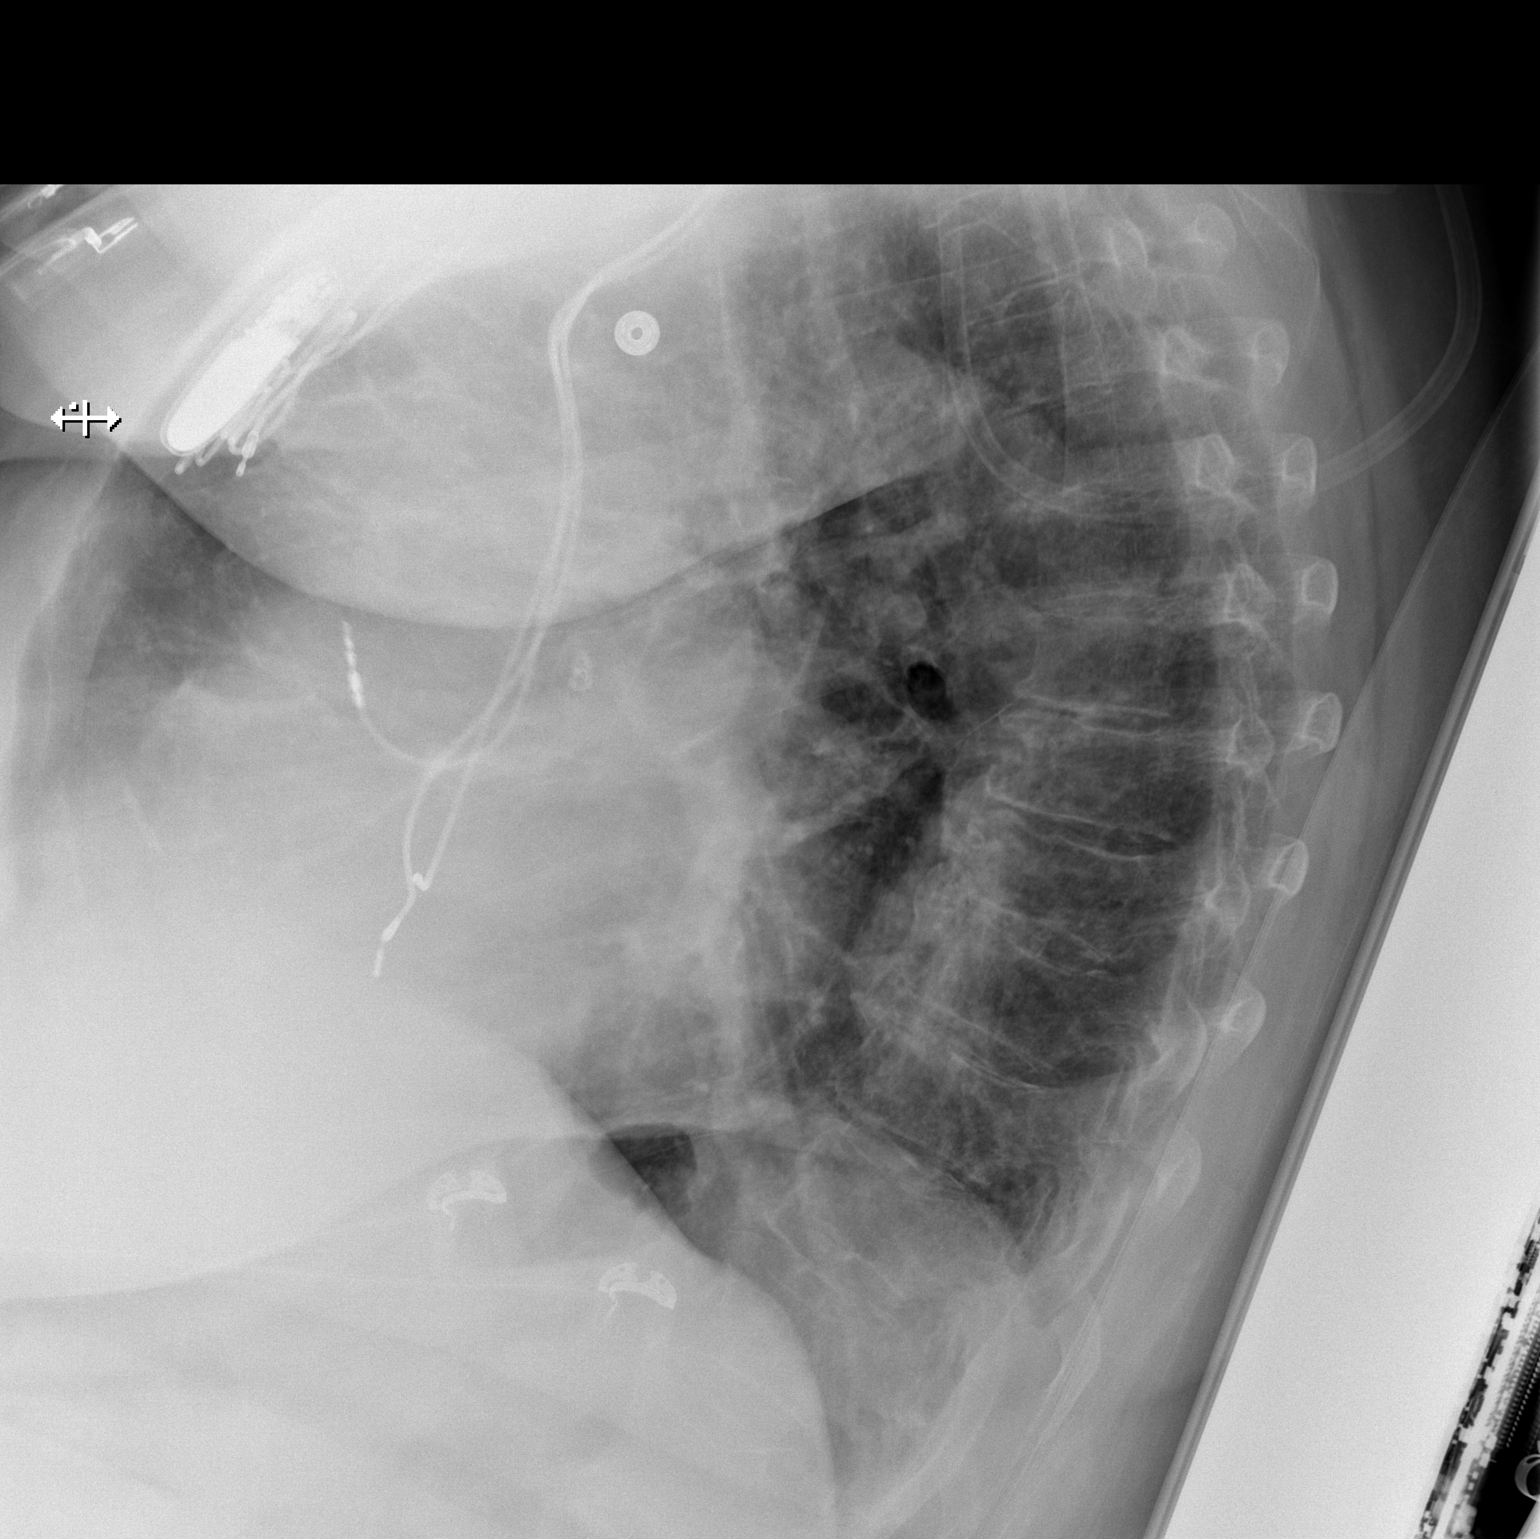

[2 of 2 positions shown; findings below may reference images not displayed]

FINDINGS: Cardiomegaly. Dual lead transvenous pacer. Mild vascular congestion.
No overt failure or infiltrates. No definite osseous findings. Minor
thoracic vertebral body irregularity appears similar to priors.
IMPRESSION: Stable cardiomegaly and mild vascular congestion.

## 2016-02-13 ENCOUNTER — Telehealth (HOSPITAL_COMMUNITY): Payer: Self-pay | Admitting: Vascular Surgery

## 2016-02-13 ENCOUNTER — Other Ambulatory Visit (HOSPITAL_COMMUNITY): Payer: Self-pay | Admitting: *Deleted

## 2016-02-13 DIAGNOSIS — I5032 Chronic diastolic (congestive) heart failure: Secondary | ICD-10-CM

## 2016-02-13 MED ORDER — SILDENAFIL CITRATE 20 MG PO TABS: 20.0000 mg | ORAL_TABLET | Freq: Three times a day (TID) | ORAL | Status: DC

## 2016-02-13 NOTE — Telephone Encounter (Signed)
Left pt message to make appt w/ DB

## 2016-02-14 ENCOUNTER — Encounter: Payer: Self-pay | Admitting: Internal Medicine

## 2016-02-14 ENCOUNTER — Ambulatory Visit (INDEPENDENT_AMBULATORY_CARE_PROVIDER_SITE_OTHER): Payer: Medicare Other | Admitting: Internal Medicine

## 2016-02-14 ENCOUNTER — Other Ambulatory Visit (INDEPENDENT_AMBULATORY_CARE_PROVIDER_SITE_OTHER): Payer: Medicare Other | Admitting: *Deleted

## 2016-02-14 ENCOUNTER — Telehealth (HOSPITAL_COMMUNITY): Payer: Self-pay | Admitting: *Deleted

## 2016-02-14 VITALS — BP 86/62 | HR 103 | Ht 71.0 in | Wt 249.6 lb

## 2016-02-14 DIAGNOSIS — I495 Sick sinus syndrome: Secondary | ICD-10-CM | POA: Diagnosis not present

## 2016-02-14 DIAGNOSIS — I480 Paroxysmal atrial fibrillation: Secondary | ICD-10-CM

## 2016-02-14 DIAGNOSIS — I482 Chronic atrial fibrillation, unspecified: Secondary | ICD-10-CM

## 2016-02-14 DIAGNOSIS — I5042 Chronic combined systolic (congestive) and diastolic (congestive) heart failure: Secondary | ICD-10-CM

## 2016-02-14 DIAGNOSIS — Z950 Presence of cardiac pacemaker: Secondary | ICD-10-CM | POA: Diagnosis not present

## 2016-02-14 LAB — BASIC METABOLIC PANEL
BUN: 32 mg/dL — AB (ref 7–25)
CALCIUM: 8 mg/dL — AB (ref 8.6–10.4)
CHLORIDE: 102 mmol/L (ref 98–110)
CO2: 26 mmol/L (ref 20–31)
CREATININE: 1.48 mg/dL — AB (ref 0.60–0.93)
Glucose, Bld: 126 mg/dL — ABNORMAL HIGH (ref 65–99)
Potassium: 4.1 mmol/L (ref 3.5–5.3)
Sodium: 141 mmol/L (ref 135–146)

## 2016-02-14 NOTE — Patient Instructions (Signed)
Medication Instructions:  Your physician recommends that you continue on your current medications as directed. Please refer to the Current Medication list given to you today.  Labwork: None ordered  Testing/Procedures: None ordered  Follow-Up: Remote monitoring is used to monitor your Pacemaker of ICD from home. This monitoring reduces the number of office visits required to check your device to one time per year. It allows [place] to keep an eye on the functioning of your device to ensure it is working properly. You are scheduled for a device check from home on 05/15/16. You may send your transmission at any time that day. If you have a wireless device, the transmission will be sent automatically. After your physician reviews your transmission, you will receive a postcard with your next transmission date.  Your physician wants you to follow-up in: 1 year with Dr. Caryl Comes. You will receive a reminder letter in the mail two months in advance. If you don't receive a letter, please call our office to schedule the follow-up appointment.   Any Other Special Instructions Will Be Listed Below (If Applicable).  If you need a refill on your cardiac medications before your next appointment, please call your pharmacy.  Thank you for choosing [facility]!!

## 2016-02-14 NOTE — Progress Notes (Signed)
Patient Care Team: Dibas Koirala, MD as PCP - General (Family Medicine) Dibas Koirala, MD (Family Medicine) Dibas Koirala, MD (Family Medicine)   HPI  [name] is a 75 y.o. female seen in followup for pacemaker implanted for tachybradycardia syndrome as well as permanent atrial fibrillation with a somewhat rapid ventricular response.    she is status post Pulm Vein isolation at [facility] 2009.   She also has hypertension, obesity as well as sleep apnea   9/13 Right and left heart catheterization demonstrating stable stenosis of the proximal LAD and interval worsening of a diagonal branch.;  it was elected because of the complex anatomy to pursue medical therapy.   She has had variable assessment of LV function.  Echo  9/13 EF 30-35%. Global 4 chamber hypokinesis last month 9/15 EF 45-50%   LAD (59/2.3) Echo 4/17  EF 40-45%  She was recently hospitalized for CHF RHC 01/09/16 RA = 15 RV = 52/8/12 PA = 54/28 (39) PCW = 13 Fick cardiac output/index = 3.5/1.5 Thermo CO/CI = 4.4/1.9 PVR = 5.9 WU Ao sat = 95% PA sat = 54%, 51%  She is now on torsemide, sidenafil and aldactone added for K support  Notes reviewed from the hospital  She is on anticoagulation          Past Medical History  Diagnosis Date  . Nonischemic cardiomyopathy ([place])     history of,  EF 20-25% at left heart cath in 06/2007; echo 2011 had normal LV function  . Chronic diastolic heart failure ([employer])     Echo 06/2010: Moderate LVH, EF 55-65%, normal wall motion, mild MR, moderate to severe LAE, mild RAE.   . Morbid obesity ([place])   . Hyperlipidemia   . Tachy-brady syndrome ([place])     status post implant of a medtronic dual-chamber pacemaker in 2001.  explanted in 2010 -- [place] pacemaker in 2010.  Marland Kitchen Chronic obstructive pulmonary disease ([place])   . Osteoporosis   . Depression   . GERD (gastroesophageal reflux disease)   . Crohn's disease ([place])   .  Hypothyroidism     treated  . Seasonal allergies   . Pacemaker     Medtronic  . Atrial fibrillation ([place])     Status post pulmonary vein isolation 2009 at [facility]  . CAD (coronary artery disease)     LHC 05/2007: pLAD 70-80%, pRCA 40%, EF 20-25%. LAD lesion treated medically.   . Coronary artery disease 06/15/2012  . Atrial fibrillation ([place]) 12/18/2007    Annotation: refractory Qualifier: Diagnosis of  By: Doy Mince LPN, Megan    . Morbid obesity with BMI of 40.0-44.9, adult ([place]) 06/22/2012  . Diabetes mellitus ([place]) 06/22/2012  . Diabetes mellitus without complication ([place])   . COPD (chronic obstructive pulmonary disease) ([place])   . Gout   . Dysrhythmia     atrial fib  . Peripheral vascular disease (HCC) 15    rt arm clots  . Obstructive sleep apnea     continuous positive airway pressure not using at present  . Shortness of breath dyspnea     exersion  . CHF (congestive heart failure) ([place])   . On home oxygen therapy     "2L at night" (10/03/2015)    Past Surgical History  Procedure Laterality Date  . Pulmonary vein isolation  05/12/2008    RFCA atrial fibrillation  . Post ablation pseudoaneurysm      at A fib ablation  .  Pacemaker insertion  2010    medtronic ADAPTA   . Tonsillectomy    . Embolectomy Right 08/16/2014    Procedure: EMBOLECTOMY- RIGHT BRACHIAL ARTERY;  Surgeon: Serafina Mitchell, MD;  Location: [facility] OR;  Service: Vascular;  Laterality: Right;  . Cardiac catheterization  08/2000    noncritical disease mid RCA, EF preserved  . Cardiac catheterization  05/29/2007    noncritical disease, EF 20-25%  . Cataract extraction w/ intraocular lens  implant, bilateral Bilateral 2014-2016  . Parathyroidectomy Right 01/30/2015    Procedure: PARATHYROIDECTOMY;  Surgeon: Armandina Gemma, MD;  Location: [place];  Service: General;  Laterality: Right;  . Parathyroidectomy Left 10/03/2015    Left inferior parathyroidectomy w/neck exploration  . Parathyroidectomy Left 10/03/2015     Procedure: LEFT PARATHYROID EXPLORATION AND  PARATHYROIDECTOMY;  Surgeon: Armandina Gemma, MD;  Location: [place];  Service: General;  Laterality: Left;  . Cardiac catheterization N/A 01/09/2016    Procedure: Right Heart Cath;  Surgeon: Jolaine Artist, MD;  Location: [place] [facility];  Service: Cardiovascular;  Laterality: N/A;    Current Outpatient Prescriptions  Medication Sig Dispense Refill  . albuterol (PROVENTIL HFA;VENTOLIN HFA) 108 (90 BASE) MCG/ACT inhaler Inhale 2 puffs into the lungs every 6 (six) hours as needed for wheezing or shortness of breath.    Marland Kitchen atorvastatin (LIPITOR) 80 MG tablet Take 80 mg by mouth at bedtime.      . colchicine 0.6 MG tablet Take 1-2 tablets by mouth daily as needed (gout). TAKE 2 TABLETS BY MOUTH AS ONE DOSE, THEN 1 TABLET IN 1 HOUR FOR ACUTE GOUT EPISODE  2  . diphenhydramine-acetaminophen (TYLENOL PM) 25-500 MG TABS tablet Take 1 tablet by mouth at bedtime as needed (sleep/pain). 14 tablet 0  . ELIQUIS 5 MG TABS tablet TAKE 1 TABLET BY MOUTH TWICE A DAY 60 tablet 5  . Fluticasone-Salmeterol (ADVAIR) 250-50 MCG/DOSE AEPB Inhale 1 puff into the lungs every 12 (twelve) hours. 60 each 6  . folic acid (FOLVITE) 1 MG tablet Take 1 mg by mouth daily.  12  . insulin aspart (NOVOLOG) 100 UNIT/ML injection Inject 10 Units into the skin 3 (three) times daily with meals.    Marland Kitchen levothyroxine (SYNTHROID, LEVOTHROID) 75 MCG tablet Take 75 mcg by mouth daily.    Marland Kitchen losartan (COZAAR) 50 MG tablet Take 50 mg by mouth daily.  6  . metoprolol (LOPRESSOR) 100 MG tablet TAKE 1 TABLET BY MOUTH TWICE A DAY 180 tablet 2  . Multiple Vitamins-Minerals (MULTIVITAMIN WITH MINERALS) tablet Take 1 tablet by mouth daily.    Marland Kitchen omeprazole (PRILOSEC) 20 MG capsule Take 20 mg by mouth daily.    . potassium chloride SA (K-DUR,KLOR-CON) 20 MEQ tablet Take 2 tablets (40 mEq total) by mouth daily. 30 tablet 0  . RANEXA 500 MG 12 hr tablet TAKE 1 TABLET BY MOUTH TWICE DAILY 180 tablet 1  .  sildenafil (REVATIO) 20 MG tablet Take 1 tablet (20 mg total) by mouth 3 (three) times daily. 90 tablet 3  . spironolactone (ALDACTONE) 25 MG tablet Take 0.5 tablets (12.5 mg total) by mouth daily. 30 tablet 0  . sulfaSALAzine (AZULFIDINE) 500 MG tablet Take 1,000 mg by mouth 2 (two) times daily.  11  . Tiotropium Bromide Monohydrate (SPIRIVA RESPIMAT) 2.5 MCG/ACT AERS Inhale 2 puffs into the lungs daily. 4 g 6  . torsemide (DEMADEX) 20 MG tablet Take 2 tablets (40 mg total) by mouth 2 (two) times daily. 60 tablet 0  .  Vitamin D, Ergocalciferol, (DRISDOL) 50000 units CAPS capsule Take 50,000 Units by mouth every Sunday.  1  . nitroGLYCERIN (NITROSTAT) 0.4 MG SL tablet Place 1 tablet (0.4 mg total) under the tongue every 5 (five) minutes as needed for chest pain. 25 tablet 1  . [DISCONTINUED] allopurinol (ZYLOPRIM) 100 MG tablet Take 100 mg by mouth daily.       No current facility-administered medications for this visit.    No Known Allergies  Review of Systems negative except from HPI and PMH  Physical Exam BP 86/62 mmHg  Pulse 103  Ht 5\' 11"  (1.803 m)  Wt 249 lb 9.6 oz (113.218 kg)  BMI 34.83 kg/m2 Well developed and well nourished in no acute distress HENT normal E scleral and icterus clear Neck Supple JVP flat; carotids brisk and full Clear to ausculation Irregular rate and rhythm, no murmurs gallops or rub Soft with active bowel sounds No clubbing cyanosis Trace Edema Alert and oriented, grossly normal motor and sensory function Skin Warm and Dry  ECG demonstrates atrial fibrillation rate of 85 Intervals/10/40  with right axis deviation     Assessment and  Plan  HFpEF/HFrEF  Atrial fibrillation-permanent  Pacemaker The patient's device was interrogated.  The information was reviewed. No changes were made in the programming.       CAD  reveiwed the physiology of pulm hypertension and obesity hypoventilation syndrome, but also the issue of atrial fibrillation.   Spoke with Dr Reine Just;  Given her PCWP at 13, he feels there is no benefit in managing the rate of the atrial fib, as LV failure is NOT the issue, that her best target would be weight loss  For now will continue currrent rhythm management;  She is to followup with 'dr DB in 2 weeks or so   Needs sleep study

## 2016-02-14 NOTE — Telephone Encounter (Signed)
Completed PA for pt's Sildenafil, med approved through 08/16/16 [id number].  [facility] aware and state copay will be $9.60.  Left mess for pt med approved

## 2016-02-15 ENCOUNTER — Other Ambulatory Visit: Payer: Self-pay | Admitting: Emergency Medicine

## 2016-02-19 LAB — CUP PACEART INCLINIC DEVICE CHECK
Battery Remaining Longevity: 105 mo
Brady Statistic RV Percent Paced: 8 %
Implantable Lead Location: 753860
Lead Channel Impedance Value: 67 Ohm
Lead Channel Pacing Threshold Amplitude: 1.25 V
Lead Channel Sensing Intrinsic Amplitude: 15.67 mV
Lead Channel Setting Pacing Amplitude: 2.5 V
MDC IDC LEAD IMPLANT DT: 20100614
MDC IDC MSMT BATTERY IMPEDANCE: 493 Ohm
MDC IDC MSMT BATTERY VOLTAGE: 2.79 V
MDC IDC MSMT LEADCHNL RV IMPEDANCE VALUE: 568 Ohm
MDC IDC MSMT LEADCHNL RV PACING THRESHOLD PULSEWIDTH: 0.4 ms
MDC IDC SESS DTM: 20170517182914
MDC IDC SET LEADCHNL RV PACING PULSEWIDTH: 0.4 ms
MDC IDC SET LEADCHNL RV SENSING SENSITIVITY: 4 mV

## 2016-02-25 ENCOUNTER — Other Ambulatory Visit: Payer: Self-pay | Admitting: Internal Medicine

## 2016-03-04 ENCOUNTER — Encounter (HOSPITAL_COMMUNITY): Payer: Self-pay | Admitting: Internal Medicine

## 2016-03-04 ENCOUNTER — Ambulatory Visit (HOSPITAL_COMMUNITY)
Admission: RE | Admit: 2016-03-04 | Discharge: 2016-03-04 | Disposition: A | Discharge status: Home / Self Care | Payer: Medicare Other | Source: Ambulatory Visit | Attending: Internal Medicine | Admitting: Internal Medicine

## 2016-03-04 VITALS — BP 102/66 | HR 80 | Wt 243.5 lb

## 2016-03-04 DIAGNOSIS — Z825 Family history of asthma and other chronic lower respiratory diseases: Secondary | ICD-10-CM | POA: Diagnosis not present

## 2016-03-04 DIAGNOSIS — Z87891 Personal history of nicotine dependence: Secondary | ICD-10-CM | POA: Insufficient documentation

## 2016-03-04 DIAGNOSIS — K5090 Crohn's disease, unspecified, without complications: Secondary | ICD-10-CM | POA: Diagnosis not present

## 2016-03-04 DIAGNOSIS — Z950 Presence of cardiac pacemaker: Secondary | ICD-10-CM | POA: Insufficient documentation

## 2016-03-04 DIAGNOSIS — K219 Gastro-esophageal reflux disease without esophagitis: Secondary | ICD-10-CM | POA: Diagnosis not present

## 2016-03-04 DIAGNOSIS — I5042 Chronic combined systolic (congestive) and diastolic (congestive) heart failure: Secondary | ICD-10-CM | POA: Diagnosis not present

## 2016-03-04 DIAGNOSIS — I5032 Chronic diastolic (congestive) heart failure: Secondary | ICD-10-CM

## 2016-03-04 DIAGNOSIS — I4891 Unspecified atrial fibrillation: Secondary | ICD-10-CM | POA: Diagnosis not present

## 2016-03-04 DIAGNOSIS — I4892 Unspecified atrial flutter: Secondary | ICD-10-CM | POA: Diagnosis not present

## 2016-03-04 DIAGNOSIS — M109 Gout, unspecified: Secondary | ICD-10-CM | POA: Insufficient documentation

## 2016-03-04 DIAGNOSIS — Z7901 Long term (current) use of anticoagulants: Secondary | ICD-10-CM | POA: Diagnosis not present

## 2016-03-04 DIAGNOSIS — Z6841 Body Mass Index (BMI) 40.0 and over, adult: Secondary | ICD-10-CM | POA: Diagnosis not present

## 2016-03-04 DIAGNOSIS — I2510 Atherosclerotic heart disease of native coronary artery without angina pectoris: Secondary | ICD-10-CM

## 2016-03-04 DIAGNOSIS — I482 Chronic atrial fibrillation, unspecified: Secondary | ICD-10-CM

## 2016-03-04 DIAGNOSIS — E785 Hyperlipidemia, unspecified: Secondary | ICD-10-CM | POA: Insufficient documentation

## 2016-03-04 DIAGNOSIS — E039 Hypothyroidism, unspecified: Secondary | ICD-10-CM | POA: Diagnosis not present

## 2016-03-04 DIAGNOSIS — Z9981 Dependence on supplemental oxygen: Secondary | ICD-10-CM | POA: Diagnosis not present

## 2016-03-04 DIAGNOSIS — Z794 Long term (current) use of insulin: Secondary | ICD-10-CM | POA: Insufficient documentation

## 2016-03-04 DIAGNOSIS — I739 Peripheral vascular disease, unspecified: Secondary | ICD-10-CM | POA: Insufficient documentation

## 2016-03-04 DIAGNOSIS — I495 Sick sinus syndrome: Secondary | ICD-10-CM | POA: Diagnosis not present

## 2016-03-04 DIAGNOSIS — J449 Chronic obstructive pulmonary disease, unspecified: Secondary | ICD-10-CM | POA: Insufficient documentation

## 2016-03-04 DIAGNOSIS — G4733 Obstructive sleep apnea (adult) (pediatric): Secondary | ICD-10-CM | POA: Insufficient documentation

## 2016-03-04 DIAGNOSIS — I428 Other cardiomyopathies: Secondary | ICD-10-CM | POA: Diagnosis not present

## 2016-03-04 DIAGNOSIS — E119 Type 2 diabetes mellitus without complications: Secondary | ICD-10-CM | POA: Insufficient documentation

## 2016-03-04 DIAGNOSIS — Z8249 Family history of ischemic heart disease and other diseases of the circulatory system: Secondary | ICD-10-CM | POA: Insufficient documentation

## 2016-03-04 LAB — BASIC METABOLIC PANEL
ANION GAP: 12 (ref 5–15)
BUN: 37 mg/dL — ABNORMAL HIGH (ref 6–20)
CALCIUM: 8.1 mg/dL — AB (ref 8.9–10.3)
CHLORIDE: 99 mmol/L — AB (ref 101–111)
CO2: 25 mmol/L (ref 22–32)
CREATININE: 1.63 mg/dL — AB (ref 0.44–1.00)
GFR calc non Af Amer: 30 mL/min — ABNORMAL LOW (ref >60)
GFR, EST AFRICAN AMERICAN: 34 mL/min — AB (ref >60)
Glucose, Bld: 141 mg/dL — ABNORMAL HIGH (ref 65–99)
Potassium: 4.2 mmol/L (ref 3.5–5.1)
SODIUM: 136 mmol/L (ref 135–145)

## 2016-03-04 MED ORDER — POTASSIUM CHLORIDE CRYS ER 20 MEQ PO TBCR: 20.0000 meq | EXTENDED_RELEASE_TABLET | Freq: Every day | ORAL | Status: DC

## 2016-03-04 MED ORDER — LOSARTAN POTASSIUM 50 MG PO TABS: 50.0000 mg | ORAL_TABLET | Freq: Every day | ORAL | Status: DC

## 2016-03-04 MED ORDER — SPIRONOLACTONE 25 MG PO TABS: 12.5000 mg | ORAL_TABLET | Freq: Every day | ORAL | Status: DC

## 2016-03-04 MED ORDER — METOPROLOL TARTRATE 50 MG PO TABS: 50.0000 mg | ORAL_TABLET | Freq: Two times a day (BID) | ORAL | Status: DC

## 2016-03-04 MED ORDER — TORSEMIDE 20 MG PO TABS: 40.0000 mg | ORAL_TABLET | Freq: Two times a day (BID) | ORAL | Status: DC

## 2016-03-04 NOTE — Addendum Note (Signed)
Encounter addended by: Scarlette Calico, RN on: 03/04/2016  3:40 PM<BR>     Documentation filed: Dx Association, Patient Instructions Section, Orders

## 2016-03-04 NOTE — Patient Instructions (Addendum)
Decrease Metoprolol to 50 mg Twice daily, we have sent you in a new prescription for 50 mg tablets  Labs today  You have been referred to Pulmonary Rehab, they will call you to schedule  Your physician recommends that you schedule a follow-up appointment in: 4-6 weeks

## 2016-03-04 NOTE — Progress Notes (Signed)
Patient ID: [name], female   DOB: [birth year], 75 y.o.   MRN: [id number]    Advanced Heart Failure Clinic Note   Primary Care: Dibas Koirala, MD Primary Cardiologist: Dr Klein/Dr Burt Knack  HPI:  [name] is a 75 y.o. female with history of chronic combined systolic/diastolic HF, morbid obesity, HLD, tachy/brady s/p PPM, COPD, GERD, A fib, DM, CAD, and COPD on home 02. She had LHC in September 2013 which showed severe stenosis of her LAD and normal LV function.  Echo 01/08/16 LVEF 40-45%. RV read as "mildly dilated" and no function evaluation. On review it is severely dilated with mod/severely reduced function. Previous Echo in 10/2015 with No EF read but RV moderately dilated with mod/severely reduced function.   Presented to the ED on 01/15/16 with SOB, weakness, and cough x 1 week. Had stopped lasix 4 days prior to admission with decreased urine output. Treated for COPD exacerbation and CAP.   Started on revatio 20 mg TID for RV failure. Discharge weight 254 lbs.    She presents today for follow up. Now home from [facility]. Feels ok. Remains very tired. Weight down another pound. Gets around house and can do all ADLs. Goes out for short trips. Not wearing her oxygen because she hasn't called for a small a tank. No edema. Weight down a pound.  Taking sildenafil 3x/day and torsemide 40 bid. Will skip a torsemide on occasion. BP remains low. No dizziness. Uses pulse oximeter at home 87-96%  RHC 01/09/16 RA = 15 RV = 52/8/12 PA = 54/28 (39) PCW = 13 Fick cardiac output/index = 3.5/1.5 Thermo CO/CI = 4.4/1.9 PVR = 5.9 WU Ao sat = 95% PA sat = 54%, 51%  Labs 01/12/16  K 3.6, Creatinine 1.21   Past Medical History  Diagnosis Date  . Nonischemic cardiomyopathy ([place])     history of,  EF 20-25% at left heart cath in 06/2007; echo 2011 had normal LV function  . Chronic diastolic heart failure ([place])     Echo 06/2010: Moderate LVH, EF 55-65%, normal wall motion, mild MR, moderate to  severe LAE, mild RAE.   . Morbid obesity ([place])   . Hyperlipidemia   . Tachy-brady syndrome ([place])     status post implant of a medtronic dual-chamber pacemaker in 2001.  explanted in 2010 -- [place] pacemaker in 2010.  Marland Kitchen Chronic obstructive pulmonary disease ([place])   . Osteoporosis   . Depression   . GERD (gastroesophageal reflux disease)   . Crohn's disease ([place])   . Hypothyroidism     treated  . Seasonal allergies   . Pacemaker     Medtronic  . Atrial fibrillation ([place])     Status post pulmonary vein isolation 2009 at [facility]  . CAD (coronary artery disease)     LHC 05/2007: pLAD 70-80%, pRCA 40%, EF 20-25%. LAD lesion treated medically.   . Coronary artery disease 06/15/2012  . Atrial fibrillation ([place]) 12/18/2007    Annotation: refractory Qualifier: Diagnosis of  By: Doy Mince LPN, Megan    . Morbid obesity with BMI of 40.0-44.9, adult ([place]) 06/22/2012  . Diabetes mellitus ([place]) 06/22/2012  . Diabetes mellitus without complication ([place])   . COPD (chronic obstructive pulmonary disease) ([place])   . Gout   . Dysrhythmia     atrial fib  . Peripheral vascular disease (HCC) 15    rt arm clots  . Obstructive sleep apnea     continuous positive airway pressure not using  at present  . Shortness of breath dyspnea     exersion  . CHF (congestive heart failure) ([place])   . On home oxygen therapy     "2L at night" (10/03/2015)    Current Outpatient Prescriptions  Medication Sig Dispense Refill  . albuterol (PROVENTIL HFA;VENTOLIN HFA) 108 (90 BASE) MCG/ACT inhaler Inhale 2 puffs into the lungs every 6 (six) hours as needed for wheezing or shortness of breath.    . diphenhydramine-acetaminophen (TYLENOL PM) 25-500 MG TABS tablet Take 1 tablet by mouth at bedtime as needed (sleep/pain). 14 tablet 0  . ELIQUIS 5 MG TABS tablet TAKE 1 TABLET BY MOUTH TWICE A DAY 60 tablet 5  . Fluticasone-Salmeterol (ADVAIR) 250-50 MCG/DOSE AEPB Inhale 1 puff into the lungs every 12  (twelve) hours. 60 each 6  . folic acid (FOLVITE) 1 MG tablet Take 1 mg by mouth daily.  12  . levothyroxine (SYNTHROID, LEVOTHROID) 75 MCG tablet Take 75 mcg by mouth daily.    Marland Kitchen losartan (COZAAR) 50 MG tablet Take 50 mg by mouth daily.  6  . metoprolol (LOPRESSOR) 100 MG tablet TAKE 1 TABLET BY MOUTH TWICE A DAY 180 tablet 2  . Multiple Vitamins-Minerals (MULTIVITAMIN WITH MINERALS) tablet Take 1 tablet by mouth daily.    Marland Kitchen omeprazole (PRILOSEC) 20 MG capsule Take 20 mg by mouth daily.    . potassium chloride SA (K-DUR,KLOR-CON) 20 MEQ tablet Take 20 mEq by mouth daily.    Marland Kitchen RANEXA 500 MG 12 hr tablet TAKE 1 TABLET BY MOUTH TWICE DAILY 180 tablet 1  . sildenafil (REVATIO) 20 MG tablet Take 1 tablet (20 mg total) by mouth 3 (three) times daily. 90 tablet 3  . SPIRIVA RESPIMAT 2.5 MCG/ACT AERS INHALE 2 PUFFS INTO THE LUNGS DAILY. 4 g 6  . spironolactone (ALDACTONE) 25 MG tablet Take 0.5 tablets (12.5 mg total) by mouth daily. 30 tablet 0  . sulfaSALAzine (AZULFIDINE) 500 MG tablet Take 1,000 mg by mouth 2 (two) times daily.  11  . torsemide (DEMADEX) 20 MG tablet Take 2 tablets (40 mg total) by mouth 2 (two) times daily. 60 tablet 0  . Vitamin D, Ergocalciferol, (DRISDOL) 50000 units CAPS capsule Take 50,000 Units by mouth every Sunday.  1  . atorvastatin (LIPITOR) 80 MG tablet Take 80 mg by mouth at bedtime.      . colchicine 0.6 MG tablet Take 1-2 tablets by mouth daily as needed (gout). Reported on 03/04/2016  2  . insulin aspart (NOVOLOG) 100 UNIT/ML injection Inject 10 Units into the skin 3 (three) times daily with meals.    . nitroGLYCERIN (NITROSTAT) 0.4 MG SL tablet Place 1 tablet (0.4 mg total) under the tongue every 5 (five) minutes as needed for chest pain. (Patient not taking: Reported on 03/04/2016) 25 tablet 1  . [DISCONTINUED] allopurinol (ZYLOPRIM) 100 MG tablet Take 100 mg by mouth daily.       No current facility-administered medications for this encounter.    No Known  Allergies    Social History   Social History  . Marital Status: Divorced    Spouse Name: N/A  . Number of Children: N/A  . Years of Education: N/A   Occupational History  . retired    Social History Main Topics  . Smoking status: Former Smoker -- 3.00 packs/day for 32 years    Quit date: 09/30/1986  . Smokeless tobacco: Former Systems developer    Quit date: 08/15/1987  . Alcohol Use: No  . Drug Use:  No  . Sexual Activity: No   Other Topics Concern  . Not on file   Social History Narrative   ** Merged History Encounter **          Family History  Problem Relation Age of Onset  . Breast cancer Mother   . Heart disease Father   . Emphysema Father     Filed Vitals:   03/04/16 1507  BP: 102/66  Pulse: 80  Weight: 243 lb 8 oz (110.451 kg)  SpO2: 95%    PHYSICAL EXAM: General: Elderly appearing HEENT: normal Neck: supple. JVP difficult due to pt size, but appears fla. Carotids 2+ bilat; no bruits. No thyromegaly or lymphadenopathy noted. Cor: PMI nondisplaced. Irregularly irregular. No M/G/R appreciated Lungs: clear decreased throughout Abdomen: morbidly obese, soft, non-tender, non-distended, no HSM. No bruits or masses. +BS  Extremities: no clubbing, rash. Legs warm with good color. No LE edema Neuro: alert & orientedx3, cranial nerves grossly intact. moves all 4 extremities w/o difficulty. Affect pleasant  ASSESSMENT & PLAN:  1. Combined systolic diastolic CHF - Echo [id number] LVEF 40-45% RV severely dilated and reduced. 2. 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 Aflutter 4. CAD 5. DM 6. COPD on chronic 02 2L at night.  7. Chronic anticoagulation on Eliquis 8. Deconditioning 9. Morbid obesity  Overall improved. NYHA III. Weight improved. BP remains quite soft though. Will decrease metoprolol to 50 bid. Continue torsemide 40 mg BID and sildenafil 20 mg TID. Check BMET today to make sure we are not overdiuresing.   Will need to follow HR with decreased  metoprolol rate. Continue Eliquis.   Will refer to pulmonary rehab and they can help [place] determine how much O2 she needs.   Glori Bickers, MD  03/04/2016

## 2016-03-07 ENCOUNTER — Telehealth (HOSPITAL_COMMUNITY): Payer: Self-pay | Admitting: *Deleted

## 2016-03-07 NOTE — Telephone Encounter (Signed)
Left vm for pt to call for lab results

## 2016-03-07 NOTE — Telephone Encounter (Signed)
Notes Recorded by Jolaine Artist, MD on 03/06/2016 at 11:30 PM On the dry side. Repeat in 2 weeks. Notes Recorded by Scarlette Calico, RN on 03/04/2016 at 4:30 PM Labs reviewed by RN, will forward to MD for review       Ref Range 3d ago (03/04/16) 3wk ago (02/14/16) 72mo ago (01/24/16) 61mo ago (01/12/16) 45mo ago (01/11/16)   Sodium 135 - 145 mmol/L 136 141R 133 (L) 138 139   Potassium 3.5 - 5.1 mmol/L 4.2 4.1R 4.2 3.6 3.0 (L)   Chloride 101 - 111 mmol/L 99 (L) 102R 94 (L) 88 (L) 86 (L)   CO2 22 - 32 mmol/L 25 26R 26 36 (H) 36 (H)   Glucose, Bld 65 - 99 mg/dL 141 (H) 126 (H) 168 (H) 130 (H) 199 (H)   BUN 6 - 20 mg/dL 37 (H) 32 (H)R 24 (H) 25 (H) 28 (H)   Creatinine, Ser 0.44 - 1.00 mg/dL 1.63 (H) 1.48 (H)R 1.57 (H) 1.21 (H) 1.38 (H)   Calcium 8.9 - 10.3 mg/dL 8.1 (L) 8.0 (L)R 7.0 (L) 7.8 (L) 7.8 (L)   GFR calc non Af Amer >60 mL/min 30 (L)  60 mL/min" class="rz_1r" style="cursor: pointer;" onmouseover='jscript: var varStyle="underline"; var bgColor="#DCD7CE"; this.style.backgroundColor=bgColor; var children=this.getElementsByTagName("div"); for(var child=0;child 31 (L) 60 mL/min" class="rz_1r" style="cursor: pointer;" onmouseover='jscript: var varStyle="underline"; var bgColor="#DCD7CE"; this.style.backgroundColor=bgColor; var children=this.getElementsByTagName("div"); for(var child=0;child 43 (L) 60 mL/min" class="rz_1s" style="cursor: pointer;" onmouseover='jscript: var varStyle="underline"; var bgColor="#DCD7CE"; this.style.backgroundColor=bgColor; var children=this.getElementsByTagName("div"); for(var child=0;child 36 (L)   GFR calc Af Amer >60 mL/min 34 (L)  60 mL/min" class="rz_1r" style="cursor: pointer;" onmouseover='jscript: var varStyle="underline"; var bgColor="#DCD7CE"; this.style.backgroundColor=bgColor; var children=this.getElementsByTagName("div"); for(var child=0;child 36 (L)CM 60 mL/min" class="rz_1r" style="cursor: pointer;" onmouseover='jscript: var varStyle="underline"; var  bgColor="#DCD7CE"; this.style.backgroundColor=bgColor; var children=this.getElementsByTagName("div"); for(var child=0;child 49 (L)CM 60 mL/min" class="rz_1s" style="cursor: pointer;" onmouseover='jscript: var varStyle="underline"; var bgColor="#DCD7CE"; this.style.backgroundColor=bgColor; var children=this.getElementsByTagName("div"); for(var child=0;child 42 (L)CM  Comments: (NOTE)

## 2016-03-29 ENCOUNTER — Ambulatory Visit (HOSPITAL_COMMUNITY)
Admission: RE | Admit: 2016-03-29 | Discharge: 2016-03-29 | Disposition: A | Discharge status: Home / Self Care | Payer: Medicare Other | Source: Ambulatory Visit | Attending: Cardiology | Admitting: Cardiology

## 2016-03-29 DIAGNOSIS — I5032 Chronic diastolic (congestive) heart failure: Secondary | ICD-10-CM | POA: Diagnosis not present

## 2016-03-29 LAB — BASIC METABOLIC PANEL
ANION GAP: 11 (ref 5–15)
BUN: 33 mg/dL — ABNORMAL HIGH (ref 6–20)
CHLORIDE: 99 mmol/L — AB (ref 101–111)
CO2: 25 mmol/L (ref 22–32)
Calcium: 8.5 mg/dL — ABNORMAL LOW (ref 8.9–10.3)
Creatinine, Ser: 1.46 mg/dL — ABNORMAL HIGH (ref 0.44–1.00)
GFR calc Af Amer: 39 mL/min — ABNORMAL LOW (ref >60)
GFR calc non Af Amer: 34 mL/min — ABNORMAL LOW (ref >60)
GLUCOSE: 113 mg/dL — AB (ref 65–99)
POTASSIUM: 4.1 mmol/L (ref 3.5–5.1)
Sodium: 135 mmol/L (ref 135–145)

## 2016-04-22 ENCOUNTER — Encounter (HOSPITAL_COMMUNITY): Payer: Self-pay | Admitting: Internal Medicine

## 2016-04-22 ENCOUNTER — Ambulatory Visit (HOSPITAL_COMMUNITY)
Admission: RE | Admit: 2016-04-22 | Discharge: 2016-04-22 | Disposition: A | Discharge status: Home / Self Care | Payer: Medicare Other | Source: Ambulatory Visit | Attending: Internal Medicine | Admitting: Internal Medicine

## 2016-04-22 VITALS — BP 96/56 | HR 92 | Wt 241.5 lb

## 2016-04-22 DIAGNOSIS — I5032 Chronic diastolic (congestive) heart failure: Secondary | ICD-10-CM

## 2016-04-22 DIAGNOSIS — N183 Chronic kidney disease, stage 3 unspecified: Secondary | ICD-10-CM

## 2016-04-22 DIAGNOSIS — I5022 Chronic systolic (congestive) heart failure: Secondary | ICD-10-CM | POA: Diagnosis not present

## 2016-04-22 DIAGNOSIS — Z6841 Body Mass Index (BMI) 40.0 and over, adult: Secondary | ICD-10-CM

## 2016-04-22 DIAGNOSIS — I482 Chronic atrial fibrillation, unspecified: Secondary | ICD-10-CM

## 2016-04-22 DIAGNOSIS — I2510 Atherosclerotic heart disease of native coronary artery without angina pectoris: Secondary | ICD-10-CM

## 2016-04-22 LAB — BASIC METABOLIC PANEL
ANION GAP: 10 (ref 5–15)
BUN: 54 mg/dL — ABNORMAL HIGH (ref 6–20)
CO2: 23 mmol/L (ref 22–32)
Calcium: 8.1 mg/dL — ABNORMAL LOW (ref 8.9–10.3)
Chloride: 103 mmol/L (ref 101–111)
Creatinine, Ser: 1.53 mg/dL — ABNORMAL HIGH (ref 0.44–1.00)
GFR, EST AFRICAN AMERICAN: 37 mL/min — AB (ref >60)
GFR, EST NON AFRICAN AMERICAN: 32 mL/min — AB (ref >60)
GLUCOSE: 168 mg/dL — AB (ref 65–99)
POTASSIUM: 3.9 mmol/L (ref 3.5–5.1)
Sodium: 136 mmol/L (ref 135–145)

## 2016-04-22 NOTE — Patient Instructions (Signed)
Labs today  Your physician recommends that you schedule a follow-up appointment in: 6 weeks with echocardiogram  

## 2016-05-15 ENCOUNTER — Telehealth: Payer: Self-pay | Admitting: Cardiology

## 2016-05-15 ENCOUNTER — Encounter: Payer: Medicare Other | Admitting: *Deleted

## 2016-05-15 NOTE — Telephone Encounter (Signed)
LMOVM reminding pt to send remote transmission.   

## 2016-05-17 ENCOUNTER — Encounter: Payer: Self-pay | Admitting: Cardiology

## 2016-06-13 ENCOUNTER — Encounter (HOSPITAL_COMMUNITY): Payer: Self-pay | Admitting: Cardiology

## 2016-06-13 ENCOUNTER — Ambulatory Visit (HOSPITAL_COMMUNITY)
Admission: RE | Admit: 2016-06-13 | Discharge: 2016-06-13 | Disposition: A | Discharge status: Home / Self Care | Payer: Medicare Other | Source: Ambulatory Visit | Attending: Family Medicine | Admitting: Family Medicine

## 2016-06-13 ENCOUNTER — Ambulatory Visit (HOSPITAL_BASED_OUTPATIENT_CLINIC_OR_DEPARTMENT_OTHER)
Admission: RE | Admit: 2016-06-13 | Discharge: 2016-06-13 | Disposition: A | Discharge status: Home / Self Care | Payer: Medicare Other | Source: Ambulatory Visit | Attending: Internal Medicine | Admitting: Internal Medicine

## 2016-06-13 VITALS — BP 96/64 | HR 80 | Wt 230.5 lb

## 2016-06-13 DIAGNOSIS — I482 Chronic atrial fibrillation, unspecified: Secondary | ICD-10-CM

## 2016-06-13 DIAGNOSIS — I5189 Other ill-defined heart diseases: Secondary | ICD-10-CM | POA: Diagnosis not present

## 2016-06-13 DIAGNOSIS — I5032 Chronic diastolic (congestive) heart failure: Secondary | ICD-10-CM | POA: Insufficient documentation

## 2016-06-13 DIAGNOSIS — N390 Urinary tract infection, site not specified: Secondary | ICD-10-CM

## 2016-06-13 DIAGNOSIS — I059 Rheumatic mitral valve disease, unspecified: Secondary | ICD-10-CM | POA: Insufficient documentation

## 2016-06-13 DIAGNOSIS — I071 Rheumatic tricuspid insufficiency: Secondary | ICD-10-CM | POA: Insufficient documentation

## 2016-06-13 DIAGNOSIS — I2721 Secondary pulmonary arterial hypertension: Secondary | ICD-10-CM

## 2016-06-13 DIAGNOSIS — I509 Heart failure, unspecified: Secondary | ICD-10-CM | POA: Diagnosis present

## 2016-06-13 DIAGNOSIS — I272 Other secondary pulmonary hypertension: Secondary | ICD-10-CM

## 2016-06-13 DIAGNOSIS — I517 Cardiomegaly: Secondary | ICD-10-CM | POA: Insufficient documentation

## 2016-06-13 DIAGNOSIS — I313 Pericardial effusion (noninflammatory): Secondary | ICD-10-CM | POA: Insufficient documentation

## 2016-06-13 LAB — ECHOCARDIOGRAM COMPLETE: Weight: 3688 oz

## 2016-06-13 LAB — BASIC METABOLIC PANEL
Anion gap: 10 (ref 5–15)
BUN: 46 mg/dL — AB (ref 6–20)
CHLORIDE: 100 mmol/L — AB (ref 101–111)
CO2: 25 mmol/L (ref 22–32)
Calcium: 8.1 mg/dL — ABNORMAL LOW (ref 8.9–10.3)
Creatinine, Ser: 2.01 mg/dL — ABNORMAL HIGH (ref 0.44–1.00)
GFR calc Af Amer: 27 mL/min — ABNORMAL LOW (ref >60)
GFR calc non Af Amer: 23 mL/min — ABNORMAL LOW (ref >60)
GLUCOSE: 116 mg/dL — AB (ref 65–99)
POTASSIUM: 4.5 mmol/L (ref 3.5–5.1)
SODIUM: 135 mmol/L (ref 135–145)

## 2016-06-13 MED ORDER — SILDENAFIL CITRATE 20 MG PO TABS: 40.0000 mg | ORAL_TABLET | Freq: Three times a day (TID) | ORAL | 3 refills | Status: AC

## 2016-06-13 MED ORDER — SILDENAFIL CITRATE 20 MG PO TABS: 60.0000 mg | ORAL_TABLET | Freq: Three times a day (TID) | ORAL | 3 refills | Status: DC

## 2016-06-13 NOTE — Patient Instructions (Signed)
INCREASE Sildenafil to 60 mg (3 tabs) three times per day   Labs today  Your physician has requested that you have a cardiac catheterization. Cardiac catheterization is used to diagnose and/or treat various heart conditions. Doctors may recommend this procedure for a number of different reasons. The most common reason is to evaluate chest pain. Chest pain can be a symptom of coronary artery disease (CAD), and cardiac catheterization can show whether plaque is narrowing or blocking your heart's arteries. This procedure is also used to evaluate the valves, as well as measure the blood flow and oxygen levels in different parts of your heart. For further information please visit [email or web address]. Please follow instruction sheet, as given.  Your physician recommends that you schedule a follow-up appointment in: 2 months

## 2016-06-13 NOTE — Addendum Note (Signed)
Encounter addended by: Kerry Dory, CMA on: 06/13/2016  4:32 PM<BR>    Actions taken: Pharmacy for encounter modified, Visit diagnoses modified, Diagnosis association updated, Order Entry activity accessed, Sign clinical note

## 2016-06-13 NOTE — Progress Notes (Signed)
  Echocardiogram 2D Echocardiogram has been performed.  Tresa Res 06/13/2016, 4:32 PM

## 2016-06-13 NOTE — Progress Notes (Signed)
Patient ID: [name], female   DOB: September 02, 1941, 75 y.o.   MRN: [id number]    Advanced Heart Failure Clinic Note   Primary Care: Dibas Koirala, MD Primary Cardiologist: Dr Klein/Dr Burt Knack  HPI:  [name] is a 75 y.o. female with history of chronic combined systolic/diastolic HF, morbid obesity, HLD, tachy/brady s/p PPM, COPD, GERD, A fib, DM, CAD, and COPD on home 02. She had LHC in September 2013 which showed severe stenosis of her LAD and normal LV function.  Echo 01/08/16 LVEF 40-45%. RV read as "mildly dilated" and no function evaluation. On review it is severely dilated with mod/severely reduced function. Previous Echo in 10/2015 with No EF read but RV moderately dilated with mod/severely reduced function.   Presented to the ED on 01/15/16 with SOB, weakness, and cough x 1 week. Had stopped lasix 4 days prior to admission with decreased urine output. Treated for COPD exacerbation and CAP.   Started on revatio 20 mg TID for RV failure. Discharge weight 254 lbs.    She presents today for regular follow up. Weight down 13 lbs since that visit.   Feels ok. Gets around house and can do all ADLs. Goes out for short trips. Gets SOM with walking to her car. Minimal edema. Taking sildenafil 3x/day and torsemide 40 bid.  BP remains low. No dizziness.   Echo today reviewed personally.  LVEF 45-50% RV severely dilated and moderately reduced, IVC dilated, small pericardial effusion, Peak pressure 80-33mm HG septum flat  RHC 01/09/16 RA = 15 RV = 52/8/12 PA = 54/28 (39) PCW = 13 Fick cardiac output/index = 3.5/1.5 Thermo CO/CI = 4.4/1.9 PVR = 5.9 WU Ao sat = 95% PA sat = 54%, 51%  Labs 01/12/16  K 3.6, Creatinine 1.21   Past Medical History:  Diagnosis Date  . Atrial fibrillation ([place])    Status post pulmonary vein isolation 2009 at [facility]  . Atrial fibrillation ([place]) 12/18/2007   Annotation: refractory Qualifier: Diagnosis of  By: Doy Mince LPN, Megan    . CAD (coronary  artery disease)    LHC 05/2007: pLAD 70-80%, pRCA 40%, EF 20-25%. LAD lesion treated medically.   . CHF (congestive heart failure) ([place])   . Chronic diastolic heart failure ([place])    Echo 06/2010: Moderate LVH, EF 55-65%, normal wall motion, mild MR, moderate to severe LAE, mild RAE.   Marland Kitchen Chronic obstructive pulmonary disease ([place])   . COPD (chronic obstructive pulmonary disease) ([place])   . Coronary artery disease 06/15/2012  . Crohn's disease ([place])   . Depression   . Diabetes mellitus ([place]) 06/22/2012  . Diabetes mellitus without complication ([place])   . Dysrhythmia    atrial fib  . GERD (gastroesophageal reflux disease)   . Gout   . Hyperlipidemia   . Hypothyroidism    treated  . Morbid obesity ([place])   . Morbid obesity with BMI of 40.0-44.9, adult ([place]) 06/22/2012  . Nonischemic cardiomyopathy ([place])    history of,  EF 20-25% at left heart cath in 06/2007; echo 2011 had normal LV function  . Obstructive sleep apnea    continuous positive airway pressure not using at present  . On home oxygen therapy    "2L at night" (10/03/2015)  . Osteoporosis   . Pacemaker    Medtronic  . Peripheral vascular disease (HCC) 15   rt arm clots  . Seasonal allergies   . Shortness of breath dyspnea    exersion  . Tachy-brady syndrome ([place])  status post implant of a medtronic dual-chamber pacemaker in 2001.  explanted in 2010 -- [place] pacemaker in 2010.    Current Outpatient Prescriptions  Medication Sig Dispense Refill  . albuterol (PROVENTIL HFA;VENTOLIN HFA) 108 (90 BASE) MCG/ACT inhaler Inhale 2 puffs into the lungs every 6 (six) hours as needed for wheezing or shortness of breath.    Marland Kitchen atorvastatin (LIPITOR) 80 MG tablet Take 80 mg by mouth at bedtime.      . colchicine 0.6 MG tablet Take 1-2 tablets by mouth daily as needed (gout). Reported on 03/04/2016  2  . diphenhydramine-acetaminophen (TYLENOL PM) 25-500 MG TABS tablet Take 1 tablet by mouth at bedtime as needed  (sleep/pain). 14 tablet 0  . ELIQUIS 5 MG TABS tablet TAKE 1 TABLET BY MOUTH TWICE A DAY 60 tablet 5  . Fluticasone-Salmeterol (ADVAIR) 250-50 MCG/DOSE AEPB Inhale 1 puff into the lungs every 12 (twelve) hours. 60 each 6  . folic acid (FOLVITE) 1 MG tablet Take 1 mg by mouth daily.  12  . insulin aspart (NOVOLOG) 100 UNIT/ML injection Inject 10 Units into the skin 3 (three) times daily with meals.    Marland Kitchen levothyroxine (SYNTHROID, LEVOTHROID) 75 MCG tablet Take 75 mcg by mouth daily.    Marland Kitchen losartan (COZAAR) 50 MG tablet Take 1 tablet (50 mg total) by mouth daily. 30 tablet 6  . metoprolol (LOPRESSOR) 50 MG tablet Take 1 tablet (50 mg total) by mouth 2 (two) times daily. 60 tablet 6  . Multiple Vitamins-Minerals (MULTIVITAMIN WITH MINERALS) tablet Take 1 tablet by mouth daily.    . nitroGLYCERIN (NITROSTAT) 0.4 MG SL tablet Place 1 tablet (0.4 mg total) under the tongue every 5 (five) minutes as needed for chest pain. 25 tablet 1  . omeprazole (PRILOSEC) 20 MG capsule Take 20 mg by mouth daily.    . potassium chloride SA (K-DUR,KLOR-CON) 20 MEQ tablet Take 1 tablet (20 mEq total) by mouth daily. 30 tablet 6  . RANEXA 500 MG 12 hr tablet TAKE 1 TABLET BY MOUTH TWICE DAILY 180 tablet 1  . sildenafil (REVATIO) 20 MG tablet Take 1 tablet (20 mg total) by mouth 3 (three) times daily. 90 tablet 3  . SPIRIVA RESPIMAT 2.5 MCG/ACT AERS INHALE 2 PUFFS INTO THE LUNGS DAILY. 4 g 6  . spironolactone (ALDACTONE) 25 MG tablet Take 0.5 tablets (12.5 mg total) by mouth daily. 15 tablet 6  . torsemide (DEMADEX) 20 MG tablet Take 2 tablets (40 mg total) by mouth 2 (two) times daily. 120 tablet 6  . Vitamin D, Ergocalciferol, (DRISDOL) 50000 units CAPS capsule Take 50,000 Units by mouth every Sunday.  1   No current facility-administered medications for this encounter.     No Known Allergies    Social History   Social History  . Marital status: Divorced    Spouse name: N/A  . Number of children: N/A  . Years  of education: N/A   Occupational History  . retired    Social History Main Topics  . Smoking status: Former Smoker    Packs/day: 3.00    Years: 32.00    Quit date: 09/30/1986  . Smokeless tobacco: Former Systems developer    Quit date: 08/15/1987  . Alcohol use No  . Drug use: No  . Sexual activity: No   Other Topics Concern  . Not on file   Social History Narrative   ** Merged History Encounter **          Family  History  Problem Relation Age of Onset  . Breast cancer Mother   . Heart disease Father   . Emphysema Father     Vitals:   06/13/16 1525  BP: 96/64  Pulse: 80  SpO2: 95%  Weight: 230 lb 8 oz (104.6 kg)   Wt Readings from Last 3 Encounters:  06/13/16 230 lb 8 oz (104.6 kg)  04/22/16 241 lb 8 oz (109.5 kg)  03/04/16 243 lb 8 oz (110.5 kg)    PHYSICAL EXAM: General: Elderly appearing HEENT: normal Neck: supple. JVP ok. Carotids 2+ bilat; no bruits. No thyromegaly or lymphadenopathy noted. Cor: PMI nondisplaced. Irregularly irregular. 2/6 TR Lungs: clear decreased throughout Abdomen: morbidly obese, soft, non-tender, non-distended, no HSM. No bruits or masses. +BS  Extremities: no clubbing, rash. Legs warm with good color. No LE edema Neuro: alert & orientedx3, cranial nerves grossly intact. moves all 4 extremities w/o difficulty. Affect pleasant  ASSESSMENT & PLAN:  1. Combined systolic diastolic CHF - Echo [id number] LVEF 45-50% RV severely dilated and moderately reduced, IVC dilated, small pericardial effusion, Peak pressure 80-85.  2. 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 Permanent AF - follows with Dr. Caryl Comes. On Eliquis and ranexa.  4. CAD 5. DM 6. COPD on chronic 02 2L at night.  7. Chronic anticoagulation on Eliquis 8. Deconditioning 9. Morbid obesity  Overall improved. NYHA II-III. Weight improved. BP remains quite soft though. Continue torsemide 40 mg BID - can decrease to 40 daily and take the afternoon dose as needed if not swelling. Increase  sildenafil to 60 mg TID. Echo reviewed today shows persistent RV stain with RVSP ~80-85. Will need repeat RHC in one month after increaing sildenafil dose to assess need for combination therapy.   Check BMET today to make sure we are not overdiuresing.   Continue Eliquis for AF.   Declined pulmonary rehab.  Glori Bickers, MD  06/13/16

## 2016-06-13 NOTE — Addendum Note (Signed)
Encounter addended by: Scarlette Calico, RN on: 06/13/2016  4:45 PM<BR>    Actions taken: Order Entry activity accessed

## 2016-06-17 ENCOUNTER — Other Ambulatory Visit (HOSPITAL_COMMUNITY): Payer: Self-pay | Admitting: *Deleted

## 2016-06-17 DIAGNOSIS — I272 Pulmonary hypertension, unspecified: Secondary | ICD-10-CM

## 2016-06-20 ENCOUNTER — Encounter (HOSPITAL_COMMUNITY): Payer: Self-pay | Admitting: Pharmacist

## 2016-06-24 ENCOUNTER — Encounter (HOSPITAL_COMMUNITY): Payer: Self-pay | Admitting: Pharmacist

## 2016-06-24 ENCOUNTER — Other Ambulatory Visit (HOSPITAL_COMMUNITY): Payer: Self-pay | Admitting: Internal Medicine

## 2016-06-27 ENCOUNTER — Telehealth (HOSPITAL_COMMUNITY): Payer: Self-pay | Admitting: Cardiology

## 2016-06-27 DIAGNOSIS — I509 Heart failure, unspecified: Secondary | ICD-10-CM

## 2016-06-27 MED ORDER — TORSEMIDE 20 MG PO TABS: 40.0000 mg | ORAL_TABLET | Freq: Every day | ORAL | 6 refills | Status: DC

## 2016-06-27 NOTE — Telephone Encounter (Signed)
Pt aware. Repeat labs 10/6

## 2016-06-27 NOTE — Telephone Encounter (Signed)
-----   Message from Jolaine Artist, MD sent at 06/17/2016  5:59 PM EDT ----- Hold torsemide x 2 days. Then decrease to 40 mg daily. Repeat 1 week.

## 2016-07-03 ENCOUNTER — Telehealth (HOSPITAL_COMMUNITY): Payer: Self-pay

## 2016-07-03 NOTE — Telephone Encounter (Signed)
Patient rescheduled RHC with Dr. Haroldine Laws from 07/10/16 to 07/15/16 at 9:00 am. Advised to arrive to [facility] center of [facility] at 7:00 am, patient confirms she still has all precath instructions and has no questions/concerns/needs regarding this.  Renee Pain, RN

## 2016-07-05 ENCOUNTER — Ambulatory Visit (HOSPITAL_COMMUNITY)
Admission: RE | Admit: 2016-07-05 | Discharge: 2016-07-05 | Disposition: A | Discharge status: Home / Self Care | Payer: Medicare Other | Source: Ambulatory Visit | Attending: Cardiology | Admitting: Cardiology

## 2016-07-05 DIAGNOSIS — I5023 Acute on chronic systolic (congestive) heart failure: Secondary | ICD-10-CM | POA: Diagnosis not present

## 2016-07-05 DIAGNOSIS — I509 Heart failure, unspecified: Secondary | ICD-10-CM | POA: Diagnosis present

## 2016-07-05 LAB — BASIC METABOLIC PANEL
Anion gap: 13 (ref 5–15)
BUN: 40 mg/dL — AB (ref 6–20)
CO2: 25 mmol/L (ref 22–32)
CREATININE: 1.75 mg/dL — AB (ref 0.44–1.00)
Calcium: 8.5 mg/dL — ABNORMAL LOW (ref 8.9–10.3)
Chloride: 100 mmol/L — ABNORMAL LOW (ref 101–111)
GFR calc Af Amer: 32 mL/min — ABNORMAL LOW (ref >60)
GFR, EST NON AFRICAN AMERICAN: 27 mL/min — AB (ref >60)
GLUCOSE: 104 mg/dL — AB (ref 65–99)
Potassium: 4.2 mmol/L (ref 3.5–5.1)
Sodium: 138 mmol/L (ref 135–145)

## 2016-07-15 ENCOUNTER — Ambulatory Visit (HOSPITAL_COMMUNITY)
Admission: RE | Admit: 2016-07-15 | Discharge: 2016-07-15 | Disposition: A | Discharge status: Home / Self Care | Payer: Medicare Other | Source: Ambulatory Visit | Attending: Internal Medicine | Admitting: Internal Medicine

## 2016-07-15 ENCOUNTER — Encounter (HOSPITAL_COMMUNITY)
Admission: RE | Disposition: A | Discharge status: Home / Self Care | Payer: Self-pay | Source: Ambulatory Visit | Attending: Internal Medicine

## 2016-07-15 ENCOUNTER — Encounter (HOSPITAL_COMMUNITY): Payer: Self-pay | Admitting: *Deleted

## 2016-07-15 DIAGNOSIS — I2721 Secondary pulmonary arterial hypertension: Secondary | ICD-10-CM | POA: Insufficient documentation

## 2016-07-15 DIAGNOSIS — J302 Other seasonal allergic rhinitis: Secondary | ICD-10-CM | POA: Insufficient documentation

## 2016-07-15 DIAGNOSIS — M810 Age-related osteoporosis without current pathological fracture: Secondary | ICD-10-CM | POA: Diagnosis not present

## 2016-07-15 DIAGNOSIS — Z8249 Family history of ischemic heart disease and other diseases of the circulatory system: Secondary | ICD-10-CM | POA: Insufficient documentation

## 2016-07-15 DIAGNOSIS — G4733 Obstructive sleep apnea (adult) (pediatric): Secondary | ICD-10-CM | POA: Insufficient documentation

## 2016-07-15 DIAGNOSIS — F329 Major depressive disorder, single episode, unspecified: Secondary | ICD-10-CM | POA: Insufficient documentation

## 2016-07-15 DIAGNOSIS — K5090 Crohn's disease, unspecified, without complications: Secondary | ICD-10-CM | POA: Insufficient documentation

## 2016-07-15 DIAGNOSIS — J449 Chronic obstructive pulmonary disease, unspecified: Secondary | ICD-10-CM | POA: Diagnosis not present

## 2016-07-15 DIAGNOSIS — K219 Gastro-esophageal reflux disease without esophagitis: Secondary | ICD-10-CM | POA: Diagnosis not present

## 2016-07-15 DIAGNOSIS — E785 Hyperlipidemia, unspecified: Secondary | ICD-10-CM | POA: Insufficient documentation

## 2016-07-15 DIAGNOSIS — I428 Other cardiomyopathies: Secondary | ICD-10-CM | POA: Diagnosis not present

## 2016-07-15 DIAGNOSIS — Z86718 Personal history of other venous thrombosis and embolism: Secondary | ICD-10-CM | POA: Diagnosis not present

## 2016-07-15 DIAGNOSIS — I482 Chronic atrial fibrillation: Secondary | ICD-10-CM | POA: Insufficient documentation

## 2016-07-15 DIAGNOSIS — I5042 Chronic combined systolic (congestive) and diastolic (congestive) heart failure: Secondary | ICD-10-CM | POA: Insufficient documentation

## 2016-07-15 DIAGNOSIS — Z87891 Personal history of nicotine dependence: Secondary | ICD-10-CM | POA: Insufficient documentation

## 2016-07-15 DIAGNOSIS — I2510 Atherosclerotic heart disease of native coronary artery without angina pectoris: Secondary | ICD-10-CM | POA: Insufficient documentation

## 2016-07-15 DIAGNOSIS — Z7901 Long term (current) use of anticoagulants: Secondary | ICD-10-CM | POA: Insufficient documentation

## 2016-07-15 DIAGNOSIS — Z794 Long term (current) use of insulin: Secondary | ICD-10-CM | POA: Insufficient documentation

## 2016-07-15 DIAGNOSIS — Z950 Presence of cardiac pacemaker: Secondary | ICD-10-CM | POA: Diagnosis not present

## 2016-07-15 DIAGNOSIS — M109 Gout, unspecified: Secondary | ICD-10-CM | POA: Diagnosis not present

## 2016-07-15 DIAGNOSIS — E1151 Type 2 diabetes mellitus with diabetic peripheral angiopathy without gangrene: Secondary | ICD-10-CM | POA: Insufficient documentation

## 2016-07-15 DIAGNOSIS — Z9981 Dependence on supplemental oxygen: Secondary | ICD-10-CM | POA: Diagnosis not present

## 2016-07-15 DIAGNOSIS — Z7951 Long term (current) use of inhaled steroids: Secondary | ICD-10-CM | POA: Diagnosis not present

## 2016-07-15 DIAGNOSIS — E039 Hypothyroidism, unspecified: Secondary | ICD-10-CM | POA: Insufficient documentation

## 2016-07-15 DIAGNOSIS — Z6833 Body mass index (BMI) 33.0-33.9, adult: Secondary | ICD-10-CM | POA: Diagnosis not present

## 2016-07-15 DIAGNOSIS — Z803 Family history of malignant neoplasm of breast: Secondary | ICD-10-CM | POA: Insufficient documentation

## 2016-07-15 DIAGNOSIS — I272 Pulmonary hypertension, unspecified: Secondary | ICD-10-CM

## 2016-07-15 HISTORY — PX: CARDIAC CATHETERIZATION: SHX172

## 2016-07-15 LAB — POCT I-STAT 3, VENOUS BLOOD GAS (G3P V)
ACID-BASE DEFICIT: 2 mmol/L (ref 0.0–2.0)
BICARBONATE: 23.7 mmol/L (ref 20.0–28.0)
Bicarbonate: 25.5 mmol/L (ref 20.0–28.0)
O2 SAT: 54 %
O2 SAT: 56 %
PCO2 VEN: 43.6 mmHg — AB (ref 44.0–60.0)
PO2 VEN: 31 mmHg — AB (ref 32.0–45.0)
TCO2: 25 mmol/L (ref 0–100)
TCO2: 27 mmol/L (ref 0–100)
pCO2, Ven: 44.4 mmHg (ref 44.0–60.0)
pH, Ven: 7.344 (ref 7.250–7.430)
pH, Ven: 7.366 (ref 7.250–7.430)
pO2, Ven: 29 mmHg — CL (ref 32.0–45.0)

## 2016-07-15 LAB — CBC
HEMATOCRIT: 42.1 % (ref 36.0–46.0)
HEMOGLOBIN: 14.1 g/dL (ref 12.0–15.0)
MCH: 32.4 pg (ref 26.0–34.0)
MCHC: 33.5 g/dL (ref 30.0–36.0)
MCV: 96.8 fL (ref 78.0–100.0)
Platelets: 369 10*3/uL (ref 150–400)
RBC: 4.35 MIL/uL (ref 3.87–5.11)
RDW: 13.9 % (ref 11.5–15.5)
WBC: 8.4 10*3/uL (ref 4.0–10.5)

## 2016-07-15 LAB — POCT I-STAT, CHEM 8
BUN: 42 mg/dL — AB (ref 6–20)
CALCIUM ION: 0.98 mmol/L — AB (ref 1.15–1.40)
CHLORIDE: 101 mmol/L (ref 101–111)
CREATININE: 1.5 mg/dL — AB (ref 0.44–1.00)
GLUCOSE: 100 mg/dL — AB (ref 65–99)
HCT: 41 % (ref 36.0–46.0)
Hemoglobin: 13.9 g/dL (ref 12.0–15.0)
Potassium: 4.3 mmol/L (ref 3.5–5.1)
Sodium: 139 mmol/L (ref 135–145)
TCO2: 25 mmol/L (ref 0–100)

## 2016-07-15 LAB — PROTIME-INR
INR: 1.17
Prothrombin Time: 14.9 seconds (ref 11.4–15.2)

## 2016-07-15 LAB — BASIC METABOLIC PANEL
ANION GAP: 9 (ref 5–15)
BUN: 47 mg/dL — ABNORMAL HIGH (ref 6–20)
CHLORIDE: 100 mmol/L — AB (ref 101–111)
CO2: 26 mmol/L (ref 22–32)
Calcium: 8 mg/dL — ABNORMAL LOW (ref 8.9–10.3)
Creatinine, Ser: 1.6 mg/dL — ABNORMAL HIGH (ref 0.44–1.00)
GFR calc Af Amer: 35 mL/min — ABNORMAL LOW (ref >60)
GFR, EST NON AFRICAN AMERICAN: 30 mL/min — AB (ref >60)
GLUCOSE: 72 mg/dL (ref 65–99)
POTASSIUM: 6 mmol/L — AB (ref 3.5–5.1)
Sodium: 135 mmol/L (ref 135–145)

## 2016-07-15 LAB — GLUCOSE, CAPILLARY: Glucose-Capillary: 87 mg/dL (ref 65–99)

## 2016-07-15 SURGERY — RIGHT HEART CATH
Anesthesia: LOCAL

## 2016-07-15 MED ORDER — SODIUM CHLORIDE 0.9% FLUSH: 3.0000 mL | INTRAVENOUS | Status: DC | PRN

## 2016-07-15 MED ORDER — SODIUM CHLORIDE 0.9 % IV SOLN: 250.0000 mL | INTRAVENOUS | Status: DC | PRN

## 2016-07-15 MED ORDER — ASPIRIN 81 MG PO CHEW
81.0000 mg | CHEWABLE_TABLET | ORAL | Status: AC
  Administered 2016-07-15: 81 mg via ORAL

## 2016-07-15 MED ORDER — SODIUM CHLORIDE 0.9% FLUSH: 3.0000 mL | Freq: Two times a day (BID) | INTRAVENOUS | Status: DC

## 2016-07-15 MED ORDER — MIDAZOLAM HCL 2 MG/2ML IJ SOLN
INTRAMUSCULAR | Status: AC
  Filled 2016-07-15: qty 2

## 2016-07-15 MED ORDER — FENTANYL CITRATE (PF) 100 MCG/2ML IJ SOLN
INTRAMUSCULAR | Status: DC | PRN
  Administered 2016-07-15: 25 ug via INTRAVENOUS

## 2016-07-15 MED ORDER — ACETAMINOPHEN 325 MG PO TABS: 650.0000 mg | ORAL_TABLET | ORAL | Status: DC | PRN

## 2016-07-15 MED ORDER — ASPIRIN 81 MG PO CHEW
CHEWABLE_TABLET | ORAL | Status: AC
  Filled 2016-07-15: qty 1

## 2016-07-15 MED ORDER — FENTANYL CITRATE (PF) 100 MCG/2ML IJ SOLN
INTRAMUSCULAR | Status: AC
  Filled 2016-07-15: qty 2

## 2016-07-15 MED ORDER — MIDAZOLAM HCL 2 MG/2ML IJ SOLN
INTRAMUSCULAR | Status: DC | PRN
  Administered 2016-07-15: 1 mg via INTRAVENOUS

## 2016-07-15 MED ORDER — HEPARIN (PORCINE) IN NACL 2-0.9 UNIT/ML-% IJ SOLN
INTRAMUSCULAR | Status: AC
  Filled 2016-07-15: qty 500

## 2016-07-15 MED ORDER — LIDOCAINE HCL (PF) 1 % IJ SOLN
INTRAMUSCULAR | Status: DC | PRN
  Administered 2016-07-15: 2 mL

## 2016-07-15 MED ORDER — ONDANSETRON HCL 4 MG/2ML IJ SOLN: 4.0000 mg | Freq: Four times a day (QID) | INTRAMUSCULAR | Status: DC | PRN

## 2016-07-15 MED ORDER — HEPARIN (PORCINE) IN NACL 2-0.9 UNIT/ML-% IJ SOLN
INTRAMUSCULAR | Status: DC | PRN
  Administered 2016-07-15: 500 mL

## 2016-07-15 MED ORDER — LIDOCAINE HCL (PF) 1 % IJ SOLN
INTRAMUSCULAR | Status: AC
  Filled 2016-07-15: qty 30

## 2016-07-15 MED ORDER — SODIUM CHLORIDE 0.9 % IV SOLN
INTRAVENOUS | Status: DC
  Administered 2016-07-15: 08:00:00 via INTRAVENOUS

## 2016-07-15 SURGICAL SUPPLY — 6 items
CATH BALLN WEDGE 5F 110CM (CATHETERS) ×1 IMPLANT
PACK CARDIAC CATHETERIZATION (CUSTOM PROCEDURE TRAY) ×2 IMPLANT
SHEATH FAST CATH BRACH 5F 5CM (SHEATH) ×1 IMPLANT
TRANSDUCER W/STOPCOCK (MISCELLANEOUS) ×3 IMPLANT
TUBING ART PRESS 72  MALE/MALE (TUBING) ×1 IMPLANT
WIRE EMERALD 3MM-J .025X260CM (WIRE) ×1 IMPLANT

## 2016-07-15 NOTE — Discharge Instructions (Signed)

## 2016-07-15 NOTE — H&P (Signed)
Advanced Heart Failure Clinic Note   Primary Care: Dibas Koirala, MD Primary Cardiologist: Dr Klein/Dr Burt Knack  HPI:  [name] is a 75 y.o. female with history of chronic combined systolic/diastolic HF, morbid obesity, HLD, tachy/brady s/p PPM, COPD, GERD, A fib, DM, CAD, and COPD on home 02. She had LHC in September 2013 which showed severe stenosis of her LAD and normal LV function.  Echo 01/08/16 LVEF 40-45%. RV read as "mildly dilated" and no function evaluation. On review it is severely dilated with mod/severely reduced function. Previous Echo in 10/2015 with No EF read but RV moderately dilated with mod/severely reduced function.   Presented to the ED on 01/15/16 with SOB, weakness, and cough x 1 week. Had stopped lasix 4 days prior to admission with decreased urine output. Treated for COPD exacerbation and CAP.   Started on revatio 20 mg TID for RV failure. Discharge weight 254 lbs.    Recently seen in clinic with Class III symptoms. Echo 06/13/16 LVEF 45-50% RV severely dilated and moderately reduced, IVC dilated, small pericardial effusion, Peak pressure 80-38mm HG septum flat  Over last few weeks diuretics adjusted due to worsening renal function. Remains on sildenafil.   RHC 01/09/16 RA = 15 RV = 52/8/12 PA = 54/28 (39) PCW = 13 Fick cardiac output/index = 3.5/1.5 Thermo CO/CI = 4.4/1.9 PVR = 5.9 WU Ao sat = 95% PA sat = 54%, 51%  Labs 01/12/16  K 3.6, Creatinine 1.21   Past Medical History:  Diagnosis Date  . Atrial fibrillation ([place])    Status post pulmonary vein isolation 2009 at [facility]  . Atrial fibrillation ([place]) 12/18/2007   Annotation: refractory Qualifier: Diagnosis of  By: Doy Mince LPN, Megan    . CAD (coronary artery disease)    LHC 05/2007: pLAD 70-80%, pRCA 40%, EF 20-25%. LAD lesion treated medically.   . CHF (congestive heart failure) ([place])   . Chronic diastolic heart failure ([place])    Echo 06/2010: Moderate LVH, EF 55-65%,  normal wall motion, mild MR, moderate to severe LAE, mild RAE.   Marland Kitchen Chronic obstructive pulmonary disease ([place])   . COPD (chronic obstructive pulmonary disease) ([place])   . Coronary artery disease 06/15/2012  . Crohn's disease ([place])   . Depression   . Diabetes mellitus ([place]) 06/22/2012  . Diabetes mellitus without complication ([place])   . Dysrhythmia    atrial fib  . GERD (gastroesophageal reflux disease)   . Gout   . Hyperlipidemia   . Hypothyroidism    treated  . Morbid obesity ([place])   . Morbid obesity with BMI of 40.0-44.9, adult ([place]) 06/22/2012  . Nonischemic cardiomyopathy ([place])    history of,  EF 20-25% at left heart cath in 06/2007; echo 2011 had normal LV function  . Obstructive sleep apnea    continuous positive airway pressure not using at present  . On home oxygen therapy    "2L at night" (10/03/2015)  . Osteoporosis   . Pacemaker    Medtronic  . Peripheral vascular disease (HCC) 15   rt arm clots  . Seasonal allergies   . Shortness of breath dyspnea    exersion  . Tachy-brady syndrome ([place])    status post implant of a medtronic dual-chamber pacemaker in 2001.  explanted in 2010 -- [place] pacemaker in 2010.          Current Outpatient Prescriptions  Medication Sig Dispense Refill  . albuterol (PROVENTIL HFA;VENTOLIN HFA) 108 (90 BASE)  MCG/ACT inhaler Inhale 2 puffs into the lungs every 6 (six) hours as needed for wheezing or shortness of breath.    Marland Kitchen atorvastatin (LIPITOR) 80 MG tablet Take 80 mg by mouth at bedtime.      . colchicine 0.6 MG tablet Take 1-2 tablets by mouth daily as needed (gout). Reported on 03/04/2016  2  . diphenhydramine-acetaminophen (TYLENOL PM) 25-500 MG TABS tablet Take 1 tablet by mouth at bedtime as needed (sleep/pain). 14 tablet 0  . ELIQUIS 5 MG TABS tablet TAKE 1 TABLET BY MOUTH TWICE A DAY 60 tablet 5  . Fluticasone-Salmeterol (ADVAIR) 250-50 MCG/DOSE AEPB Inhale 1 puff into the lungs  every 12 (twelve) hours. 60 each 6  . folic acid (FOLVITE) 1 MG tablet Take 1 mg by mouth daily.  12  . insulin aspart (NOVOLOG) 100 UNIT/ML injection Inject 10 Units into the skin 3 (three) times daily with meals.    Marland Kitchen levothyroxine (SYNTHROID, LEVOTHROID) 75 MCG tablet Take 75 mcg by mouth daily.    Marland Kitchen losartan (COZAAR) 50 MG tablet Take 1 tablet (50 mg total) by mouth daily. 30 tablet 6  . metoprolol (LOPRESSOR) 50 MG tablet Take 1 tablet (50 mg total) by mouth 2 (two) times daily. 60 tablet 6  . Multiple Vitamins-Minerals (MULTIVITAMIN WITH MINERALS) tablet Take 1 tablet by mouth daily.    . nitroGLYCERIN (NITROSTAT) 0.4 MG SL tablet Place 1 tablet (0.4 mg total) under the tongue every 5 (five) minutes as needed for chest pain. 25 tablet 1  . omeprazole (PRILOSEC) 20 MG capsule Take 20 mg by mouth daily.    . potassium chloride SA (K-DUR,KLOR-CON) 20 MEQ tablet Take 1 tablet (20 mEq total) by mouth daily. 30 tablet 6  . RANEXA 500 MG 12 hr tablet TAKE 1 TABLET BY MOUTH TWICE DAILY 180 tablet 1  . sildenafil (REVATIO) 20 MG tablet Take 1 tablet (20 mg total) by mouth 3 (three) times daily. 90 tablet 3  . SPIRIVA RESPIMAT 2.5 MCG/ACT AERS INHALE 2 PUFFS INTO THE LUNGS DAILY. 4 g 6  . spironolactone (ALDACTONE) 25 MG tablet Take 0.5 tablets (12.5 mg total) by mouth daily. 15 tablet 6  . torsemide (DEMADEX) 20 MG tablet Take 2 tablets (40 mg total) by mouth 2 (two) times daily. 120 tablet 6  . Vitamin D, Ergocalciferol, (DRISDOL) 50000 units CAPS capsule Take 50,000 Units by mouth every Sunday.  1   No current facility-administered medications for this encounter.     No Known Allergies    Social History        Social History  . Marital status: Divorced    Spouse name: N/A  . Number of children: N/A  . Years of education: N/A       Occupational History  . retired         Social History Main Topics  . Smoking status: Former Smoker    Packs/day: 3.00     Years: 32.00    Quit date: 09/30/1986  . Smokeless tobacco: Former Systems developer    Quit date: 08/15/1987  . Alcohol use No  . Drug use: No  . Sexual activity: No       Other Topics Concern  . Not on file      Social History Narrative   ** Merged History Encounter **               Family History  Problem Relation Age of Onset  . Breast cancer Mother   . Heart  disease Father   . Emphysema Father        Vitals:   06/13/16 1525  BP: 96/64  Pulse: 80  SpO2: 95%  Weight: 230 lb 8 oz (104.6 kg)      Wt Readings from Last 3 Encounters:  06/13/16 230 lb 8 oz (104.6 kg)  04/22/16 241 lb 8 oz (109.5 kg)  03/04/16 243 lb 8 oz (110.5 kg)    PHYSICAL EXAM: General: Elderly appearing HEENT: normal Neck: supple. JVP ok. Carotids 2+ bilat; no bruits. No thyromegaly or lymphadenopathy noted. Cor: PMI nondisplaced. Irregularly irregular. 2/6 TR Lungs: clear decreased throughout Abdomen: morbidly obese, soft, non-tender, non-distended, no HSM. No bruits or masses. +BS  Extremities: no clubbing, rash. Legs warm with good color. No LE edema Neuro: alert & orientedx3, cranial nerves grossly intact. moves all 4 extremities w/o difficulty. Affect pleasant  ASSESSMENT & PLAN:  1. Combined systolic diastolic CHF - Echo [id number] LVEF 45-50% RV severely dilated and moderately reduced, IVC dilated, small pericardial effusion, Peak pressure 80-85.  2. 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 Permanent AF - follows with Dr. Caryl Comes. On Eliquis and ranexa.  4. CAD 5. DM 6. COPD on chronic 02 2L at night.  7. Chronic anticoagulation on Eliquis 8. Deconditioning 9. Morbid obesity 10. [place] - WHO Group III  Echo reviewed at last visit showed persistent RV stain with RVSP ~80-85. Plan RHC today to further evaluate and assess need for addition of macitentan.Glori Bickers, MD  06/13/16

## 2016-07-19 ENCOUNTER — Telehealth (HOSPITAL_COMMUNITY): Payer: Self-pay | Admitting: Pharmacist

## 2016-07-19 NOTE — Telephone Encounter (Addendum)
Opsumit PA approved by [employer] through 09/29/17.   Ruta Hinds. Velva Harman, PharmD, BCPS, CPP Clinical Pharmacist Pager: [phone] Phone: [phone] 07/19/2016 9:44 AM

## 2016-07-26 ENCOUNTER — Other Ambulatory Visit: Payer: Self-pay | Admitting: Emergency Medicine

## 2016-07-26 MED ORDER — TIOTROPIUM BROMIDE MONOHYDRATE 2.5 MCG/ACT IN AERS: INHALATION_SPRAY | RESPIRATORY_TRACT | 2 refills | Status: AC

## 2016-07-29 ENCOUNTER — Other Ambulatory Visit: Payer: Self-pay | Admitting: Internal Medicine

## 2016-08-14 ENCOUNTER — Ambulatory Visit (HOSPITAL_COMMUNITY)
Admission: RE | Admit: 2016-08-14 | Discharge: 2016-08-14 | Disposition: A | Discharge status: Home / Self Care | Payer: Medicare Other | Source: Ambulatory Visit | Attending: Internal Medicine | Admitting: Internal Medicine

## 2016-08-14 VITALS — BP 121/74 | HR 90 | Resp 20 | Wt 244.5 lb

## 2016-08-14 DIAGNOSIS — J449 Chronic obstructive pulmonary disease, unspecified: Secondary | ICD-10-CM | POA: Diagnosis not present

## 2016-08-14 DIAGNOSIS — I5032 Chronic diastolic (congestive) heart failure: Secondary | ICD-10-CM

## 2016-08-14 DIAGNOSIS — Z950 Presence of cardiac pacemaker: Secondary | ICD-10-CM | POA: Insufficient documentation

## 2016-08-14 DIAGNOSIS — Z794 Long term (current) use of insulin: Secondary | ICD-10-CM | POA: Insufficient documentation

## 2016-08-14 DIAGNOSIS — E039 Hypothyroidism, unspecified: Secondary | ICD-10-CM | POA: Insufficient documentation

## 2016-08-14 DIAGNOSIS — I5042 Chronic combined systolic (congestive) and diastolic (congestive) heart failure: Secondary | ICD-10-CM | POA: Diagnosis present

## 2016-08-14 DIAGNOSIS — E1151 Type 2 diabetes mellitus with diabetic peripheral angiopathy without gangrene: Secondary | ICD-10-CM | POA: Insufficient documentation

## 2016-08-14 DIAGNOSIS — I2510 Atherosclerotic heart disease of native coronary artery without angina pectoris: Secondary | ICD-10-CM | POA: Diagnosis not present

## 2016-08-14 DIAGNOSIS — I482 Chronic atrial fibrillation, unspecified: Secondary | ICD-10-CM

## 2016-08-14 DIAGNOSIS — M109 Gout, unspecified: Secondary | ICD-10-CM | POA: Insufficient documentation

## 2016-08-14 DIAGNOSIS — I495 Sick sinus syndrome: Secondary | ICD-10-CM | POA: Diagnosis not present

## 2016-08-14 DIAGNOSIS — Z7901 Long term (current) use of anticoagulants: Secondary | ICD-10-CM | POA: Diagnosis not present

## 2016-08-14 DIAGNOSIS — Z87891 Personal history of nicotine dependence: Secondary | ICD-10-CM | POA: Insufficient documentation

## 2016-08-14 DIAGNOSIS — I272 Pulmonary hypertension, unspecified: Secondary | ICD-10-CM

## 2016-08-14 DIAGNOSIS — Z8249 Family history of ischemic heart disease and other diseases of the circulatory system: Secondary | ICD-10-CM | POA: Diagnosis not present

## 2016-08-14 DIAGNOSIS — K5090 Crohn's disease, unspecified, without complications: Secondary | ICD-10-CM | POA: Insufficient documentation

## 2016-08-14 DIAGNOSIS — I428 Other cardiomyopathies: Secondary | ICD-10-CM | POA: Insufficient documentation

## 2016-08-14 DIAGNOSIS — K219 Gastro-esophageal reflux disease without esophagitis: Secondary | ICD-10-CM | POA: Diagnosis not present

## 2016-08-14 DIAGNOSIS — E785 Hyperlipidemia, unspecified: Secondary | ICD-10-CM | POA: Insufficient documentation

## 2016-08-14 DIAGNOSIS — Z9981 Dependence on supplemental oxygen: Secondary | ICD-10-CM | POA: Insufficient documentation

## 2016-08-14 DIAGNOSIS — G4733 Obstructive sleep apnea (adult) (pediatric): Secondary | ICD-10-CM | POA: Diagnosis not present

## 2016-08-14 DIAGNOSIS — Z7951 Long term (current) use of inhaled steroids: Secondary | ICD-10-CM | POA: Diagnosis not present

## 2016-08-14 DIAGNOSIS — Z825 Family history of asthma and other chronic lower respiratory diseases: Secondary | ICD-10-CM | POA: Insufficient documentation

## 2016-08-14 DIAGNOSIS — Z79899 Other long term (current) drug therapy: Secondary | ICD-10-CM | POA: Insufficient documentation

## 2016-08-14 MED ORDER — TORSEMIDE 20 MG PO TABS: 40.0000 mg | ORAL_TABLET | Freq: Every day | ORAL | 6 refills | Status: DC

## 2016-08-14 NOTE — Addendum Note (Signed)
Encounter addended by: Kerry Dory, CMA on: 08/14/2016  3:08 PM<BR>    Actions taken: Diagnosis association updated, Order list changed, Sign clinical note

## 2016-08-14 NOTE — Progress Notes (Signed)
Patient ID: [name], female   DOB: [birth year], 75 y.o.   MRN: [id number]    Advanced Heart Failure Clinic Note   Primary Care: Dibas Koirala, MD Primary Cardiologist: Dr Klein/Dr Burt Knack  HPI:  [name] is a 75 y.o. female with history of chronic combined systolic/diastolic HF, morbid obesity, HLD, tachy/brady s/p PPM, COPD, GERD, A fib, DM, CAD, and COPD on home 02. She had LHC in September 2013 which showed severe stenosis of her LAD and normal LV function.  Echo 01/08/16 LVEF 40-45%. RV read as "mildly dilated" and no function evaluation. On review it is severely dilated with mod/severely reduced function. Previous Echo in 10/2015 with No EF read but RV moderately dilated with mod/severely reduced function.   Presented to the ED on 01/15/16 with SOB, weakness, and cough x 1 week. Had stopped lasix 4 days prior to admission with decreased urine output. Treated for COPD exacerbation and CAP.   Started on revatio 20 mg TID for RV failure. Discharge weight 254 lbs.    She presents today for regular follow up. Since last visit she underwent RHC which showed persistently high pulmonary pressures. Macitentan added. Now on macitentan 10 daily and sildenafil 40 tid. Says she is feeling pretty good. Doesn't notice much difference.  Gets around house and can do all ADLs. Goes out for short trips.  Minimal edema. No dizziness or syncope. Creatinine up slightly at last visit. Torsemide reduced from 40 bid to 40 daily.  Weight up 7 pounds from last visit.   Echo 9/17  LVEF 45-50% RV severely dilated and moderately reduced, IVC dilated, small pericardial effusion, Peak pressure 80-54mm HG septum flat  RHC 07/15/16  RA = 11 RV = 62/12 PA = 61/23 (38) PCW = 14 Fick cardiac output/index = 4.0/1.8 PVR = 5.9 WU Ao sat = 94% PA sat = 54%, 55%  RHC 01/09/16 RA = 15 RV = 52/8/12 PA = 54/28 (39) PCW = 13 Fick cardiac output/index = 3.5/1.5 Thermo CO/CI = 4.4/1.9 PVR = 5.9 WU Ao sat =  95% PA sat = 54%, 51%  Labs 01/12/16  K 3.6, Creatinine 1.21   Past Medical History:  Diagnosis Date  . Atrial fibrillation ([place])    Status post pulmonary vein isolation 2009 at [facility]  . Atrial fibrillation ([place]) 12/18/2007   Annotation: refractory Qualifier: Diagnosis of  By: Doy Mince LPN, Megan    . CAD (coronary artery disease)    LHC 05/2007: pLAD 70-80%, pRCA 40%, EF 20-25%. LAD lesion treated medically.   . CHF (congestive heart failure) ([place])   . Chronic diastolic heart failure ([place])    Echo 06/2010: Moderate LVH, EF 55-65%, normal wall motion, mild MR, moderate to severe LAE, mild RAE.   Marland Kitchen Chronic obstructive pulmonary disease ([place])   . COPD (chronic obstructive pulmonary disease) ([place])   . Coronary artery disease 06/15/2012  . Crohn's disease ([place])   . Depression   . Diabetes mellitus ([place]) 06/22/2012  . Diabetes mellitus without complication ([place])   . Dysrhythmia    atrial fib  . GERD (gastroesophageal reflux disease)   . Gout   . Hyperlipidemia   . Hypothyroidism    treated  . Morbid obesity ([place])   . Morbid obesity with BMI of 40.0-44.9, adult ([place]) 06/22/2012  . Nonischemic cardiomyopathy ([place])    history of,  EF 20-25% at left heart cath in 06/2007; echo 2011 had normal LV function  . Obstructive sleep apnea    continuous  positive airway pressure not using at present  . On home oxygen therapy    "2L at night" (10/03/2015)  . Osteoporosis   . Pacemaker    Medtronic  . Peripheral vascular disease (HCC) 15   rt arm clots  . Seasonal allergies   . Shortness of breath dyspnea    exersion  . Tachy-brady syndrome ([place])    status post implant of a medtronic dual-chamber pacemaker in 2001.  explanted in 2010 -- [place] pacemaker in 2010.    Current Outpatient Prescriptions  Medication Sig Dispense Refill  . ADVAIR DISKUS 500-50 MCG/DOSE AEPB Inhale 1 puff into the lungs every 12 (twelve) hours.    Marland Kitchen albuterol (PROVENTIL HFA;VENTOLIN HFA)  108 (90 BASE) MCG/ACT inhaler Inhale 2 puffs into the lungs every 6 (six) hours as needed for wheezing or shortness of breath.    Marland Kitchen atorvastatin (LIPITOR) 80 MG tablet Take 80 mg by mouth at bedtime.      . colchicine 0.6 MG tablet Take 1-2 tablets by mouth daily as needed (gout). Reported on 03/04/2016  2  . diphenhydramine-acetaminophen (TYLENOL PM) 25-500 MG TABS tablet Take 1 tablet by mouth at bedtime as needed (sleep/pain). 14 tablet 0  . ELIQUIS 5 MG TABS tablet TAKE 1 TABLET BY MOUTH TWICE A DAY 60 tablet 5  . Fluticasone-Salmeterol (ADVAIR) 250-50 MCG/DOSE AEPB Inhale 1 puff into the lungs every 12 (twelve) hours. 60 each 6  . folic acid (FOLVITE) 1 MG tablet Take 1 mg by mouth daily.  12  . insulin aspart (NOVOLOG) 100 UNIT/ML injection Inject 10 Units into the skin daily.     Marland Kitchen levothyroxine (SYNTHROID, LEVOTHROID) 100 MCG tablet Take 100 mcg by mouth daily.    Marland Kitchen losartan (COZAAR) 50 MG tablet Take 1 tablet (50 mg total) by mouth daily. 30 tablet 6  . Macitentan (OPSUMIT) 10 MG TABS Take 10 mg by mouth daily.    . metoprolol (LOPRESSOR) 50 MG tablet Take 1 tablet (50 mg total) by mouth 2 (two) times daily. 60 tablet 6  . Multiple Vitamins-Minerals (MULTIVITAMIN WITH MINERALS) tablet Take 1 tablet by mouth daily.    . Multiple Vitamins-Minerals (PRESERVISION AREDS PO) Take 1 capsule by mouth 2 (two) times daily.    . nitroGLYCERIN (NITROSTAT) 0.4 MG SL tablet Place 1 tablet (0.4 mg total) under the tongue every 5 (five) minutes as needed for chest pain. 25 tablet 1  . nystatin cream (MYCOSTATIN) Apply 1 application topically 2 (two) times daily as needed. For skin irritation.    Marland Kitchen omeprazole (PRILOSEC) 20 MG capsule Take 20 mg by mouth daily.    . potassium chloride SA (K-DUR,KLOR-CON) 20 MEQ tablet Take 1 tablet (20 mEq total) by mouth daily. 30 tablet 6  . RANEXA 500 MG 12 hr tablet TAKE 1 TABLET BY MOUTH TWICE DAILY 180 tablet 1  . sildenafil (REVATIO) 20 MG tablet Take 2 tablets (40 mg  total) by mouth 3 (three) times daily. 810 tablet 3  . spironolactone (ALDACTONE) 25 MG tablet Take 0.5 tablets (12.5 mg total) by mouth daily. 15 tablet 6  . sulfaSALAzine (AZULFIDINE) 500 MG tablet Take 1,000 mg by mouth 2 (two) times daily.    . Tiotropium Bromide Monohydrate (SPIRIVA RESPIMAT) 2.5 MCG/ACT AERS INHALE 2 PUFFS INTO THE LUNGS DAILY. 4 g 2  . torsemide (DEMADEX) 20 MG tablet Take 2 tablets (40 mg total) by mouth daily. 120 tablet 6   No current facility-administered medications for this encounter.  No Known Allergies    Social History   Social History  . Marital status: Divorced    Spouse name: N/A  . Number of children: N/A  . Years of education: N/A   Occupational History  . retired    Social History Main Topics  . Smoking status: Former Smoker    Packs/day: 3.00    Years: 32.00    Quit date: 09/30/1986  . Smokeless tobacco: Former Systems developer    Quit date: 08/15/1987  . Alcohol use No  . Drug use: No  . Sexual activity: No   Other Topics Concern  . Not on file   Social History Narrative   ** Merged History Encounter **          Family History  Problem Relation Age of Onset  . Breast cancer Mother   . Heart disease Father   . Emphysema Father     Vitals:   08/14/16 1424  BP: 121/74  Pulse: 90  Resp: 20  SpO2: 94%  Weight: 244 lb 8 oz (110.9 kg)   Wt Readings from Last 3 Encounters:  08/14/16 244 lb 8 oz (110.9 kg)  07/15/16 237 lb (107.5 kg)  06/13/16 230 lb 8 oz (104.6 kg)    PHYSICAL EXAM: General: Elderly appearing. NAD HEENT: normal Neck: supple. JVP 8 Carotids 2+ bilat; no bruits. No thyromegaly or lymphadenopathy noted. Cor: PMI nondisplaced. Irregularly irregular. 2/6 TR Lungs: clear decreased throughout Abdomen: morbidly obese, soft, non-tender, non-distended, no HSM. No bruits or masses. +BS  Extremities: no clubbing, rash. Legs warm with good color. Trace edema Neuro: alert & orientedx3, cranial nerves grossly intact.  moves all 4 extremities w/o difficulty. Affect pleasant  ASSESSMENT & PLAN:  1. Combined systolic diastolic CHF  - Echo [id number] LVEF 45-50% RV severely dilated and moderately reduced, IVC dilated, small pericardial effusion, Peak pressure 80-85.  - RHC as above - NYHA III.  -Will keep torsemide at 40 daily but have her take an afternoon dose as well on Monday and [name] 2. PAH --Echo 9/17 as above macitentan added after echo --Now on sildenafil 40 and macitentan 10 daily --NYHA III.  -- Will repeat echo in 2-3 months. If still has persistent RH strain consider selxepag --She has declined pulmoanry rehab 3. Tachy-brady syndrome s/p Medtronic PPM 4. Permanent AF - follows with Dr. Caryl Comes. On Eliquis and ranexa.  5. CAD 6. DM 7. COPD on chronic O2 2L at night.  8. Morbid obesity   Glori Bickers, MD  08/14/16

## 2016-08-14 NOTE — Patient Instructions (Signed)
CHANGE Torsemide to 40 mg daily except 40 mg twice a day in Mondays and Fridays  Your physician recommends that you schedule a follow-up appointment in: 2-3 months with Dr Haroldine Laws  Your physician has requested that you have an echocardiogram. Echocardiography is a painless test that uses sound waves to create images of your heart. It provides your doctor with information about the size and shape of your heart and how well your heart's chambers and valves are working. This procedure takes approximately one hour. There are no restrictions for this procedure.  Do the following things EVERYDAY: 1) Weigh yourself in the morning before breakfast. Write it down and keep it in a log. 2) Take your medicines as prescribed 3) Eat low salt foods-Limit salt (sodium) to 2000 mg per day.  4) Stay as active as you can everyday 5) Limit all fluids for the day to less than 2 liters

## 2016-10-06 ENCOUNTER — Other Ambulatory Visit (HOSPITAL_COMMUNITY): Payer: Self-pay | Admitting: Internal Medicine

## 2016-10-15 ENCOUNTER — Other Ambulatory Visit: Payer: Self-pay | Admitting: Internal Medicine

## 2016-10-18 ENCOUNTER — Other Ambulatory Visit: Payer: Self-pay | Admitting: *Deleted

## 2016-10-18 MED ORDER — POTASSIUM CHLORIDE CRYS ER 20 MEQ PO TBCR: 20.0000 meq | EXTENDED_RELEASE_TABLET | Freq: Two times a day (BID) | ORAL | 1 refills | Status: DC

## 2016-10-18 NOTE — Telephone Encounter (Signed)
Rx refill sent to pharmacy. 

## 2016-11-02 ENCOUNTER — Other Ambulatory Visit (HOSPITAL_COMMUNITY): Payer: Self-pay | Admitting: Internal Medicine

## 2016-11-13 ENCOUNTER — Other Ambulatory Visit: Payer: Self-pay | Admitting: Internal Medicine

## 2016-12-06 ENCOUNTER — Other Ambulatory Visit (HOSPITAL_COMMUNITY): Payer: Self-pay | Admitting: Internal Medicine

## 2016-12-20 ENCOUNTER — Other Ambulatory Visit (HOSPITAL_COMMUNITY): Payer: Self-pay | Admitting: Internal Medicine

## 2017-01-14 ENCOUNTER — Other Ambulatory Visit (HOSPITAL_COMMUNITY): Payer: Self-pay | Admitting: Internal Medicine

## 2017-01-22 ENCOUNTER — Other Ambulatory Visit: Payer: Self-pay | Admitting: Internal Medicine

## 2017-02-07 ENCOUNTER — Telehealth (HOSPITAL_COMMUNITY): Payer: Self-pay | Admitting: Pharmacist

## 2017-02-07 NOTE — Telephone Encounter (Signed)
Sildenafil 40 mg TID PA approved by [employer] through 09/29/17.   Ruta Hinds. Velva Harman, PharmD, BCPS, CPP Clinical Pharmacist Pager: [phone] Phone: [phone] 02/07/2017 12:41 PM

## 2017-02-11 ENCOUNTER — Other Ambulatory Visit: Payer: Self-pay | Admitting: Nephrology

## 2017-02-11 DIAGNOSIS — N189 Chronic kidney disease, unspecified: Secondary | ICD-10-CM

## 2017-02-17 ENCOUNTER — Other Ambulatory Visit: Payer: Self-pay

## 2017-02-19 ENCOUNTER — Other Ambulatory Visit: Payer: Self-pay | Admitting: Internal Medicine

## 2017-02-19 NOTE — Telephone Encounter (Signed)
Pt last saw Dr Haroldine Laws 08/14/2016, needs follow up per his note, telephoned his nurse, Nira Conn and made her aware, she will have schedulers contact pt. Last SCr was 1.60 on 07/15/16. Wt at last visit was 113.2Kg. Age 39yrs old. Will refill Eliquis 5mg  BID.

## 2017-02-22 ENCOUNTER — Other Ambulatory Visit (HOSPITAL_COMMUNITY): Payer: Self-pay | Admitting: Internal Medicine

## 2017-02-25 ENCOUNTER — Ambulatory Visit
Admission: RE | Admit: 2017-02-25 | Discharge: 2017-02-25 | Disposition: A | Discharge status: Home / Self Care | Payer: Medicare Other | Source: Ambulatory Visit | Attending: Nephrology | Admitting: Nephrology

## 2017-02-25 DIAGNOSIS — N189 Chronic kidney disease, unspecified: Secondary | ICD-10-CM

## 2017-04-03 ENCOUNTER — Other Ambulatory Visit: Payer: Self-pay

## 2017-04-03 ENCOUNTER — Ambulatory Visit (HOSPITAL_BASED_OUTPATIENT_CLINIC_OR_DEPARTMENT_OTHER)
Admission: RE | Admit: 2017-04-03 | Discharge: 2017-04-03 | Disposition: A | Discharge status: Home / Self Care | Payer: Medicare Other | Source: Ambulatory Visit | Attending: Internal Medicine | Admitting: Internal Medicine

## 2017-04-03 ENCOUNTER — Ambulatory Visit (HOSPITAL_COMMUNITY)
Admission: RE | Admit: 2017-04-03 | Discharge: 2017-04-03 | Disposition: A | Discharge status: Home / Self Care | Payer: Medicare Other | Source: Ambulatory Visit | Attending: Family Medicine | Admitting: Family Medicine

## 2017-04-03 VITALS — BP 112/62 | HR 87 | Wt 212.8 lb

## 2017-04-03 DIAGNOSIS — I313 Pericardial effusion (noninflammatory): Secondary | ICD-10-CM | POA: Insufficient documentation

## 2017-04-03 DIAGNOSIS — I495 Sick sinus syndrome: Secondary | ICD-10-CM | POA: Insufficient documentation

## 2017-04-03 DIAGNOSIS — E785 Hyperlipidemia, unspecified: Secondary | ICD-10-CM | POA: Insufficient documentation

## 2017-04-03 DIAGNOSIS — E1122 Type 2 diabetes mellitus with diabetic chronic kidney disease: Secondary | ICD-10-CM | POA: Insufficient documentation

## 2017-04-03 DIAGNOSIS — Z950 Presence of cardiac pacemaker: Secondary | ICD-10-CM | POA: Insufficient documentation

## 2017-04-03 DIAGNOSIS — I5032 Chronic diastolic (congestive) heart failure: Secondary | ICD-10-CM | POA: Diagnosis not present

## 2017-04-03 DIAGNOSIS — K5090 Crohn's disease, unspecified, without complications: Secondary | ICD-10-CM | POA: Insufficient documentation

## 2017-04-03 DIAGNOSIS — I2510 Atherosclerotic heart disease of native coronary artery without angina pectoris: Secondary | ICD-10-CM | POA: Diagnosis not present

## 2017-04-03 DIAGNOSIS — K219 Gastro-esophageal reflux disease without esophagitis: Secondary | ICD-10-CM | POA: Insufficient documentation

## 2017-04-03 DIAGNOSIS — I429 Cardiomyopathy, unspecified: Secondary | ICD-10-CM | POA: Diagnosis not present

## 2017-04-03 DIAGNOSIS — I2721 Secondary pulmonary arterial hypertension: Secondary | ICD-10-CM | POA: Diagnosis not present

## 2017-04-03 DIAGNOSIS — Z79899 Other long term (current) drug therapy: Secondary | ICD-10-CM | POA: Insufficient documentation

## 2017-04-03 DIAGNOSIS — E039 Hypothyroidism, unspecified: Secondary | ICD-10-CM | POA: Insufficient documentation

## 2017-04-03 DIAGNOSIS — M109 Gout, unspecified: Secondary | ICD-10-CM | POA: Insufficient documentation

## 2017-04-03 DIAGNOSIS — Z794 Long term (current) use of insulin: Secondary | ICD-10-CM | POA: Diagnosis not present

## 2017-04-03 DIAGNOSIS — I482 Chronic atrial fibrillation, unspecified: Secondary | ICD-10-CM

## 2017-04-03 DIAGNOSIS — Z87891 Personal history of nicotine dependence: Secondary | ICD-10-CM | POA: Insufficient documentation

## 2017-04-03 DIAGNOSIS — E1151 Type 2 diabetes mellitus with diabetic peripheral angiopathy without gangrene: Secondary | ICD-10-CM | POA: Insufficient documentation

## 2017-04-03 DIAGNOSIS — F329 Major depressive disorder, single episode, unspecified: Secondary | ICD-10-CM | POA: Insufficient documentation

## 2017-04-03 DIAGNOSIS — G4733 Obstructive sleep apnea (adult) (pediatric): Secondary | ICD-10-CM | POA: Insufficient documentation

## 2017-04-03 DIAGNOSIS — I5042 Chronic combined systolic (congestive) and diastolic (congestive) heart failure: Secondary | ICD-10-CM | POA: Insufficient documentation

## 2017-04-03 DIAGNOSIS — Z8249 Family history of ischemic heart disease and other diseases of the circulatory system: Secondary | ICD-10-CM | POA: Diagnosis not present

## 2017-04-03 DIAGNOSIS — Z6841 Body Mass Index (BMI) 40.0 and over, adult: Secondary | ICD-10-CM | POA: Insufficient documentation

## 2017-04-03 DIAGNOSIS — Z803 Family history of malignant neoplasm of breast: Secondary | ICD-10-CM | POA: Diagnosis not present

## 2017-04-03 DIAGNOSIS — Z7901 Long term (current) use of anticoagulants: Secondary | ICD-10-CM | POA: Diagnosis not present

## 2017-04-03 LAB — BASIC METABOLIC PANEL
Anion gap: 11 (ref 5–15)
BUN: 38 mg/dL — AB (ref 6–20)
CHLORIDE: 100 mmol/L — AB (ref 101–111)
CO2: 25 mmol/L (ref 22–32)
Calcium: 8.3 mg/dL — ABNORMAL LOW (ref 8.9–10.3)
Creatinine, Ser: 1.53 mg/dL — ABNORMAL HIGH (ref 0.44–1.00)
GFR calc Af Amer: 37 mL/min — ABNORMAL LOW (ref >60)
GFR, EST NON AFRICAN AMERICAN: 32 mL/min — AB (ref >60)
GLUCOSE: 118 mg/dL — AB (ref 65–99)
POTASSIUM: 3.8 mmol/L (ref 3.5–5.1)
Sodium: 136 mmol/L (ref 135–145)

## 2017-04-03 LAB — CBC
HEMATOCRIT: 40.8 % (ref 36.0–46.0)
Hemoglobin: 12.8 g/dL (ref 12.0–15.0)
MCH: 28 pg (ref 26.0–34.0)
MCHC: 31.4 g/dL (ref 30.0–36.0)
MCV: 89.3 fL (ref 78.0–100.0)
Platelets: 424 10*3/uL — ABNORMAL HIGH (ref 150–400)
RBC: 4.57 MIL/uL (ref 3.87–5.11)
RDW: 17.8 % — AB (ref 11.5–15.5)
WBC: 7.3 10*3/uL (ref 4.0–10.5)

## 2017-04-03 NOTE — Progress Notes (Signed)
Patient ID: [name], female   DOB: 06/18/1941, 76 y.o.   MRN: [id number]    Advanced Heart Failure Clinic Note   Primary Care: Dibas Koirala, MD Primary Cardiologist: Dr Klein/Dr Burt Knack  HPI:  [name] is a 76 y.o. female with history of chronic combined systolic/diastolic HF, morbid obesity, HLD, tachy/brady s/p PPM, COPD, GERD, A fib, DM, CAD, and COPD on home 02.   She had LHC in September 2013 with Dr. Burt Knack which showed a 70-75% proximal LAD stenosis which was unchanged from 2008 and 95% ostial D2 lesion (large D2). Turned down for CABG. Decided on medical therapy  Echo 01/08/16 LVEF 40-45%. RV read as "mildly dilated" and no function evaluation. On review it is severely dilated with mod/severely reduced function. Previous Echo in 10/2015 with No EF read but RV moderately dilated with mod/severely reduced function.   Presented to the ED on 01/15/16 with SOB, weakness, and cough x 1 week. Had stopped lasix 4 days prior to admission with decreased urine output. Treated for COPD exacerbation and CAP.   Started on revatio 20 mg TID for RV failure. Discharge weight 254 lbs.    She presents today for regular follow up. Overall doing well. Since we last saw her she has lost about 30 pounds. Says she is just eating less. Still taking  sildenafil 40 tid but has been out of macitentan. Says she doesn't notice a difference. Remains pretty active. Makes a point of going out every day.  Taking torsemide 40 daily. Will take an extra 40 as needed. Mild edema No orthopnea or PND. Takes HR and BP regularly. HR in 80s. SBP 120-130. Taking Eliquis without bleeding. After weight loss sats have been > 90% and not using O2 as much any more  Echo today EF 40-45% RV severely dilated and moderately reduced, IVC dilated, small pericardial effusion  Personally reviewed  Echo 9/17  LVEF 45-50% RV severely dilated and moderately reduced, IVC dilated, small pericardial effusion, Peak pressure 80-51mm HG  septum flat  RHC 07/15/16  RA = 11 RV = 62/12 PA = 61/23 (38) PCW = 14 Fick cardiac output/index = 4.0/1.8 PVR = 5.9 WU Ao sat = 94% PA sat = 54%, 55%  RHC 01/09/16 RA = 15 RV = 52/8/12 PA = 54/28 (39) PCW = 13 Fick cardiac output/index = 3.5/1.5 Thermo CO/CI = 4.4/1.9 PVR = 5.9 WU Ao sat = 95% PA sat = 54%, 51%  Labs 01/12/16  K 3.6, Creatinine 1.21   Past Medical History:  Diagnosis Date  . Atrial fibrillation ([place])    Status post pulmonary vein isolation 2009 at [facility]  . Atrial fibrillation ([place]) 12/18/2007   Annotation: refractory Qualifier: Diagnosis of  By: Doy Mince LPN, Megan    . CAD (coronary artery disease)    LHC 05/2007: pLAD 70-80%, pRCA 40%, EF 20-25%. LAD lesion treated medically.   . CHF (congestive heart failure) ([place])   . Chronic diastolic heart failure ([place])    Echo 06/2010: Moderate LVH, EF 55-65%, normal wall motion, mild MR, moderate to severe LAE, mild RAE.   Marland Kitchen Chronic obstructive pulmonary disease ([place])   . COPD (chronic obstructive pulmonary disease) ([place])   . Coronary artery disease 06/15/2012  . Crohn's disease ([place])   . Depression   . Diabetes mellitus ([place]) 06/22/2012  . Diabetes mellitus without complication ([place])   . Dysrhythmia    atrial fib  . GERD (gastroesophageal reflux disease)   . Gout   .  Hyperlipidemia   . Hypothyroidism    treated  . Morbid obesity ([place])   . Morbid obesity with BMI of 40.0-44.9, adult ([place]) 06/22/2012  . Nonischemic cardiomyopathy ([place])    history of,  EF 20-25% at left heart cath in 06/2007; echo 2011 had normal LV function  . Obstructive sleep apnea    continuous positive airway pressure not using at present  . On home oxygen therapy    "2L at night" (10/03/2015)  . Osteoporosis   . Pacemaker    Medtronic  . Peripheral vascular disease ([facility]) 15   rt arm clots  . Seasonal allergies   . Shortness of breath dyspnea    exersion  . Tachy-brady syndrome ([place])    status post implant of a medtronic  dual-chamber pacemaker in 2001.  explanted in 2010 -- [place] pacemaker in 2010.    Current Outpatient Prescriptions  Medication Sig Dispense Refill  . ADVAIR DISKUS 500-50 MCG/DOSE AEPB Inhale 1 puff into the lungs every 12 (twelve) hours.    Marland Kitchen albuterol (PROVENTIL HFA;VENTOLIN HFA) 108 (90 BASE) MCG/ACT inhaler Inhale 2 puffs into the lungs every 6 (six) hours as needed for wheezing or shortness of breath.    Marland Kitchen atorvastatin (LIPITOR) 80 MG tablet Take 80 mg by mouth at bedtime.      . colchicine 0.6 MG tablet Take 1-2 tablets by mouth daily as needed (gout). Reported on 03/04/2016  2  . diphenhydramine-acetaminophen (TYLENOL PM) 25-500 MG TABS tablet Take 1 tablet by mouth at bedtime as needed (sleep/pain). 14 tablet 0  . ELIQUIS 5 MG TABS tablet TAKE 1 TABLET BY MOUTH TWICE A DAY 60 tablet 5  . Fluticasone-Salmeterol (ADVAIR) 250-50 MCG/DOSE AEPB Inhale 1 puff into the lungs every 12 (twelve) hours. 60 each 6  . folic acid (FOLVITE) 1 MG tablet Take 1 mg by mouth daily.  12  . insulin aspart (NOVOLOG) 100 UNIT/ML injection Inject 10 Units into the skin daily.     Marland Kitchen levothyroxine (SYNTHROID, LEVOTHROID) 100 MCG tablet Take 100 mcg by mouth daily.    Marland Kitchen losartan (COZAAR) 50 MG tablet TAKE 1 TABLET (50 MG TOTAL) BY MOUTH DAILY. 90 tablet 3  . metoprolol tartrate (LOPRESSOR) 50 MG tablet TAKE 1 TABLET (50 MG TOTAL) BY MOUTH 2 (TWO) TIMES DAILY. 60 tablet 6  . Multiple Vitamins-Minerals (MULTIVITAMIN WITH MINERALS) tablet Take 1 tablet by mouth daily.    . Multiple Vitamins-Minerals (PRESERVISION AREDS PO) Take 1 capsule by mouth 2 (two) times daily.    . nitroGLYCERIN (NITROSTAT) 0.4 MG SL tablet Place 1 tablet (0.4 mg total) under the tongue every 5 (five) minutes as needed for chest pain. 25 tablet 1  . nystatin cream (MYCOSTATIN) Apply 1 application topically 2 (two) times daily as needed. For skin irritation.    Marland Kitchen omeprazole (PRILOSEC) 20 MG capsule Take 20 mg by mouth  daily.    . potassium chloride SA (KLOR-CON M20) 20 MEQ tablet Take 1 tablet (20 mEq total) by mouth 2 (two) times daily. 180 tablet 1  . ranolazine (RANEXA) 500 MG 12 hr tablet Take 1 tablet (500 mg total) by mouth 2 (two) times daily. *Please call and schedule a one year follow up appointment* 180 tablet 0  . sildenafil (REVATIO) 20 MG tablet Take 2 tablets (40 mg total) by mouth 3 (three) times daily. 810 tablet 3  . spironolactone (ALDACTONE) 25 MG tablet TAKE 1/2 TABLET BY MOUTH EVERY DAY 15 tablet 6  . sulfaSALAzine (  AZULFIDINE) 500 MG tablet Take 1,000 mg by mouth 2 (two) times daily.    . Tiotropium Bromide Monohydrate (SPIRIVA RESPIMAT) 2.5 MCG/ACT AERS INHALE 2 PUFFS INTO THE LUNGS DAILY. 4 g 2  . torsemide (DEMADEX) 20 MG tablet Take 2 tablets (40 mg total) by mouth daily. 180 tablet 3  . Macitentan (OPSUMIT) 10 MG TABS Take 10 mg by mouth daily.     No current facility-administered medications for this encounter.     No Known Allergies    Social History   Social History  . Marital status: Divorced    Spouse name: N/A  . Number of children: N/A  . Years of education: N/A   Occupational History  . retired    Social History Main Topics  . Smoking status: Former Smoker    Packs/day: 3.00    Years: 32.00    Quit date: 09/30/1986  . Smokeless tobacco: Former Systems developer    Quit date: 08/15/1987  . Alcohol use No  . Drug use: No  . Sexual activity: No   Other Topics Concern  . Not on file   Social History Narrative   ** Merged History Encounter **          Family History  Problem Relation Age of Onset  . Breast cancer Mother   . Heart disease Father   . Emphysema Father     Vitals:   04/03/17 1510  BP: 112/62  Pulse: 87  SpO2: 96%  Weight: 212 lb 12 oz (96.5 kg)   Wt Readings from Last 3 Encounters:  04/03/17 212 lb 12 oz (96.5 kg)  08/14/16 244 lb 8 oz (110.9 kg)  07/15/16 237 lb (107.5 kg)    PHYSICAL EXAM: General:  Well appearing. No resp  difficulty HEENT: normal Neck: supple. JVP 7-8 + prominent CV waves. Carotids 2+ bilat; no bruits. No lymphadenopathy or thryomegaly appreciated. Cor: PMI nondisplaced. Regular rate & rhythm. No rubs, gallops or murmurs. Lungs: clear Abdomen: obese soft, nontender, nondistended. No hepatosplenomegaly. No bruits or masses. Good bowel sounds. Extremities: no cyanosis, rash, edema. Mild clubbing. + compression socks Neuro: alert & orientedx3, cranial nerves grossly intact. moves all 4 extremities w/o difficulty. Affect pleasant   ASSESSMENT & PLAN:  1. Combined systolic diastolic CHF  - Echo 10/08/30 LVEF 45-50% RV severely dilated and moderately reduced, IVC dilated, small pericardial effusion, Peak pressure 80-85.  - Echo today reviewed personally.LVEF 40-45%, diffuse Moderately dilated RV with moderately decreased systolic function. D-shaped interventricular septum suggestive of   RV pressure/volume overload. Biatrial enlargement. Moderate TR. RVSP 53. - Overall improved with weight loss and compliance despite persistent RV failure on echo - Volume status ok. Continue current regimen. 2. PAH --Improved NYHA II but echo as above with persistent RV failure --Now on sildenafil 40. Will restart macitentan 10 daily --Will discuss f/u RHC and possible Selexapeg at next visit --She has declined pulmoanry rehab 3. Tachy-brady syndrome s/p Medtronic PPM 4. Permanent AF - follows with Dr. Caryl Comes. On Eliquis and ranexa.  -ECG with AF with Vpacing at 70. I wonder if v-pacing is contributing to her cardiomyopathy 5. CAD -cath 2013 with LAD 70-75% lesion and ostial 95% D2. Managed medically -no s/s angina. -Continue medical management for now.  -On statin and Eliquis. 6. DM -Per PCP 7. COPD on chronic O2 2L at night. -O2 requirement much reduced with weight loss  8. Morbid obesity -Congratulated her on weight loss.   Glori Bickers, MD  04/03/17

## 2017-04-03 NOTE — Progress Notes (Signed)
  Echocardiogram 2D Echocardiogram has been performed.  Jahnaya Branscome L Androw 04/03/2017, 2:50 PM

## 2017-04-03 NOTE — Patient Instructions (Signed)
Labs today  Restart Opsumit, we are looking into this for you  We will contact you in 4 months to schedule your next appointment.

## 2017-04-30 ENCOUNTER — Other Ambulatory Visit: Payer: Self-pay | Admitting: Internal Medicine

## 2017-05-31 ENCOUNTER — Other Ambulatory Visit (HOSPITAL_COMMUNITY): Payer: Self-pay | Admitting: Internal Medicine

## 2017-06-01 ENCOUNTER — Emergency Department (HOSPITAL_COMMUNITY)
Admission: EM | Admit: 2017-06-01 | Discharge: 2017-06-01 | Disposition: A | Discharge status: Home / Self Care | Payer: Medicare Other | Attending: Emergency Medicine | Admitting: Emergency Medicine

## 2017-06-01 ENCOUNTER — Emergency Department (HOSPITAL_COMMUNITY): Payer: Medicare Other

## 2017-06-01 ENCOUNTER — Encounter (HOSPITAL_COMMUNITY): Payer: Self-pay

## 2017-06-01 DIAGNOSIS — Z950 Presence of cardiac pacemaker: Secondary | ICD-10-CM | POA: Insufficient documentation

## 2017-06-01 DIAGNOSIS — J449 Chronic obstructive pulmonary disease, unspecified: Secondary | ICD-10-CM | POA: Insufficient documentation

## 2017-06-01 DIAGNOSIS — E785 Hyperlipidemia, unspecified: Secondary | ICD-10-CM | POA: Insufficient documentation

## 2017-06-01 DIAGNOSIS — E039 Hypothyroidism, unspecified: Secondary | ICD-10-CM | POA: Insufficient documentation

## 2017-06-01 DIAGNOSIS — N390 Urinary tract infection, site not specified: Secondary | ICD-10-CM | POA: Diagnosis not present

## 2017-06-01 DIAGNOSIS — Z87891 Personal history of nicotine dependence: Secondary | ICD-10-CM | POA: Diagnosis not present

## 2017-06-01 DIAGNOSIS — I428 Other cardiomyopathies: Secondary | ICD-10-CM | POA: Diagnosis not present

## 2017-06-01 DIAGNOSIS — I130 Hypertensive heart and chronic kidney disease with heart failure and stage 1 through stage 4 chronic kidney disease, or unspecified chronic kidney disease: Secondary | ICD-10-CM | POA: Insufficient documentation

## 2017-06-01 DIAGNOSIS — I4891 Unspecified atrial fibrillation: Secondary | ICD-10-CM | POA: Insufficient documentation

## 2017-06-01 DIAGNOSIS — I5032 Chronic diastolic (congestive) heart failure: Secondary | ICD-10-CM | POA: Insufficient documentation

## 2017-06-01 DIAGNOSIS — R1032 Left lower quadrant pain: Secondary | ICD-10-CM | POA: Diagnosis present

## 2017-06-01 DIAGNOSIS — Z79899 Other long term (current) drug therapy: Secondary | ICD-10-CM | POA: Diagnosis not present

## 2017-06-01 DIAGNOSIS — N183 Chronic kidney disease, stage 3 (moderate): Secondary | ICD-10-CM | POA: Diagnosis not present

## 2017-06-01 DIAGNOSIS — I2510 Atherosclerotic heart disease of native coronary artery without angina pectoris: Secondary | ICD-10-CM | POA: Diagnosis not present

## 2017-06-01 LAB — URINALYSIS, ROUTINE W REFLEX MICROSCOPIC
Bilirubin Urine: NEGATIVE
GLUCOSE, UA: NEGATIVE mg/dL
Hgb urine dipstick: NEGATIVE
KETONES UR: NEGATIVE mg/dL
NITRITE: NEGATIVE
PH: 5 (ref 5.0–8.0)
Protein, ur: 100 mg/dL — AB
Specific Gravity, Urine: 1.015 (ref 1.005–1.030)

## 2017-06-01 LAB — COMPREHENSIVE METABOLIC PANEL
ALT: 23 U/L (ref 14–54)
AST: 30 U/L (ref 15–41)
Albumin: 3.6 g/dL (ref 3.5–5.0)
Alkaline Phosphatase: 123 U/L (ref 38–126)
Anion gap: 9 (ref 5–15)
BILIRUBIN TOTAL: 1 mg/dL (ref 0.3–1.2)
BUN: 51 mg/dL — AB (ref 6–20)
CALCIUM: 8.6 mg/dL — AB (ref 8.9–10.3)
CO2: 25 mmol/L (ref 22–32)
CREATININE: 1.8 mg/dL — AB (ref 0.44–1.00)
Chloride: 104 mmol/L (ref 101–111)
GFR calc Af Amer: 30 mL/min — ABNORMAL LOW (ref >60)
GFR, EST NON AFRICAN AMERICAN: 26 mL/min — AB (ref >60)
Glucose, Bld: 157 mg/dL — ABNORMAL HIGH (ref 65–99)
Potassium: 3.9 mmol/L (ref 3.5–5.1)
Sodium: 138 mmol/L (ref 135–145)
TOTAL PROTEIN: 6.8 g/dL (ref 6.5–8.1)

## 2017-06-01 LAB — CBC
HCT: 35.1 % — ABNORMAL LOW (ref 36.0–46.0)
Hemoglobin: 11.1 g/dL — ABNORMAL LOW (ref 12.0–15.0)
MCH: 28.1 pg (ref 26.0–34.0)
MCHC: 31.6 g/dL (ref 30.0–36.0)
MCV: 88.9 fL (ref 78.0–100.0)
PLATELETS: 362 10*3/uL (ref 150–400)
RBC: 3.95 MIL/uL (ref 3.87–5.11)
RDW: 17.6 % — AB (ref 11.5–15.5)
WBC: 6 10*3/uL (ref 4.0–10.5)

## 2017-06-01 LAB — LIPASE, BLOOD: LIPASE: 31 U/L (ref 11–51)

## 2017-06-01 MED ORDER — DICYCLOMINE HCL 20 MG PO TABS: 20.0000 mg | ORAL_TABLET | Freq: Two times a day (BID) | ORAL | 0 refills | Status: DC | PRN

## 2017-06-01 MED ORDER — SODIUM CHLORIDE 0.9 % IV BOLUS (SEPSIS)
500.0000 mL | Freq: Once | INTRAVENOUS | Status: AC
  Administered 2017-06-01: 500 mL via INTRAVENOUS

## 2017-06-01 MED ORDER — CEPHALEXIN 500 MG PO CAPS: 500.0000 mg | ORAL_CAPSULE | Freq: Three times a day (TID) | ORAL | 0 refills | Status: AC

## 2017-06-01 MED ORDER — ONDANSETRON HCL 4 MG/2ML IJ SOLN
4.0000 mg | Freq: Once | INTRAMUSCULAR | Status: AC
  Administered 2017-06-01: 4 mg via INTRAVENOUS
  Filled 2017-06-01: qty 2

## 2017-06-01 MED ORDER — MORPHINE SULFATE (PF) 4 MG/ML IV SOLN
4.0000 mg | Freq: Once | INTRAVENOUS | Status: AC
  Administered 2017-06-01: 4 mg via INTRAVENOUS
  Filled 2017-06-01: qty 1

## 2017-06-01 NOTE — ED Triage Notes (Signed)
Patient complains of LLQ abdominal pain x 1 week. Reports that the pain has become more constant with no vomiting, no diarrhea and normal bowel movements. Reports intermittent chills

## 2017-06-01 NOTE — ED Provider Notes (Signed)
[facility] DEPT Provider Note   CSN: [id number] Arrival date & time: 06/01/17  1046     History   Chief Complaint Chief Complaint  Patient presents with  . Abdominal Pain    HPI [name] is a 76 y.o. female.  HPI  76 year old female with a history of atrial fibrillation, coronary artery disease, CHF, COPD, diabetes, Crohn's disease, htn, hlpd presents with concern for left lower quadrant abdominal pain.  Reports that at the sharp pain that has been present for one week. No radiation of pain.  Pain worse laying on back, better laying on side. Reports subjective fever and chills. Denies nausea, vomiting, diarrhea. Reports normal bowel movements. Denies dysuria. Denies cough, congestion, or shortness of breath.   Past Medical History:  Diagnosis Date  . Atrial fibrillation ([place])    Status post pulmonary vein isolation 2009 at [facility]  . Atrial fibrillation ([place]) 12/18/2007   Annotation: refractory Qualifier: Diagnosis of  By: Doy Mince LPN, Megan    . CAD (coronary artery disease)    LHC 05/2007: pLAD 70-80%, pRCA 40%, EF 20-25%. LAD lesion treated medically.   . CHF (congestive heart failure) ([place])   . Chronic diastolic heart failure ([place])    Echo 06/2010: Moderate LVH, EF 55-65%, normal wall motion, mild MR, moderate to severe LAE, mild RAE.   Marland Kitchen Chronic obstructive pulmonary disease ([place])   . COPD (chronic obstructive pulmonary disease) ([place])   . Coronary artery disease 06/15/2012  . Crohn's disease ([place])   . Depression   . Diabetes mellitus ([place]) 06/22/2012  . Diabetes mellitus without complication ([place])   . Dysrhythmia    atrial fib  . GERD (gastroesophageal reflux disease)   . Gout   . Hyperlipidemia   . Hypothyroidism    treated  . Morbid obesity ([place])   . Morbid obesity with BMI of 40.0-44.9, adult ([place]) 06/22/2012  . Nonischemic cardiomyopathy ([place])    history of,  EF 20-25% at left heart cath in 06/2007; echo 2011 had normal LV function  . Obstructive  sleep apnea    continuous positive airway pressure not using at present  . On home oxygen therapy    "2L at night" (10/03/2015)  . Osteoporosis   . Pacemaker    Medtronic  . Peripheral vascular disease (HCC) 15   rt arm clots  . Seasonal allergies   . Shortness of breath dyspnea    exersion  . Tachy-brady syndrome ([place])    status post implant of a medtronic dual-chamber pacemaker in 2001.  explanted in 2010 -- [place] pacemaker in 2010.    Patient Active Problem List   Diagnosis Date Noted  . Hypokalemia   . Chronic atrial fibrillation ([place])   . Pulmonary hypertension ([place]), RHC on 01/09/2016 01/09/2016  . RVF (right ventricular failure) ([place])   . UTI (lower urinary tract infection) 10/27/2015  . AKI (acute kidney injury) ([place]) 10/16/2015  . Hypomagnesemia 10/13/2015  . Malnutrition of moderate degree 10/11/2015  . Hypocalcemia 10/10/2015  . Near syncope 03/28/2015  . Uncomplicated asthma 52/77/8242  . CKD (chronic kidney disease) stage 3, GFR 30-59 ml/min 03/13/2015  . Chronic obstructive pulmonary disease ([place]) 03/13/2015  . Crohn's disease of large intestine without complication ([place]) 35/36/1443  . Gastro-esophageal reflux disease without esophagitis 03/13/2015  . Personal history of infectious and parasitic disease 03/13/2015  . Gout 03/13/2015  . Hypertensive heart disease with CHF (congestive heart failure) ([place]) 03/13/2015  . Hypothyroidism 03/13/2015  . Low back  pain 03/13/2015  . Extreme obesity 03/13/2015  . Paroxysmal atrial fibrillation ([place]) 03/13/2015  . Osteopenia 03/13/2015  . Primary hyperparathyroidism ([place]) 03/13/2015  . Secondary polycythemia 03/13/2015  . Sleep apnea 03/13/2015  . Type 2 diabetes mellitus with diabetic nephropathy ([place]) 03/13/2015  . OSA (obstructive sleep apnea) 02/18/2015  . Pulmonary hypertension due to sleep-disordered breathing ([place]) 02/18/2015  . Hyperparathyroidism, primary ([place]) 01/30/2015  . Obesity  11/10/2014  . Ischemia of hand 08/15/2014  . Morbid obesity with BMI of 40.0-44.9, adult ([place]) 06/22/2012  . Coronary artery disease 06/15/2012  . Long term current use of anticoagulant therapy 01/11/2011  . CARDIAC PACEMAKER IN SITU-MEDTRONIC ADAPTA L 01/15/2010  . DIASTOLIC HEART FAILURE, CHRONIC 12/22/2008  . DYSLIPIDEMIA 09/05/2008  . OBESITY-MORBID (>100') 09/05/2008  . OTHER OSTEOPOROSIS 09/05/2008  . CROHN'S DISEASE, HX OF 09/05/2008  . COPD with emphysema ([place]) 01/02/2008  . HYPERSOMNIA 12/21/2007  . ANXIETY 12/18/2007  . DEPRESSION 12/18/2007  . Atrial fibrillation ([place]) 12/18/2007    Past Surgical History:  Procedure Laterality Date  . CARDIAC CATHETERIZATION  08/2000   noncritical disease mid RCA, EF preserved  . CARDIAC CATHETERIZATION  05/29/2007   noncritical disease, EF 20-25%  . CARDIAC CATHETERIZATION N/A 01/09/2016   Procedure: Right Heart Cath;  Surgeon: Jolaine Artist, MD;  Location: [facility];  Service: Cardiovascular;  Laterality: N/A;  . CARDIAC CATHETERIZATION N/A 07/15/2016   Procedure: Right Heart Cath;  Surgeon: Jolaine Artist, MD;  Location: [facility];  Service: Cardiovascular;  Laterality: N/A;  . CATARACT EXTRACTION W/ INTRAOCULAR LENS  IMPLANT, BILATERAL Bilateral 2014-2016  . EMBOLECTOMY Right 08/16/2014   Procedure: EMBOLECTOMY- RIGHT BRACHIAL ARTERY;  Surgeon: Serafina Mitchell, MD;  Location: [facility];  Service: Vascular;  Laterality: Right;  . PACEMAKER INSERTION  2010   medtronic ADAPTA   . PARATHYROIDECTOMY Right 01/30/2015   Procedure: PARATHYROIDECTOMY;  Surgeon: Armandina Gemma, MD;  Location: [facility];  Service: General;  Laterality: Right;  . PARATHYROIDECTOMY Left 10/03/2015   Left inferior parathyroidectomy w/neck exploration  . PARATHYROIDECTOMY Left 10/03/2015   Procedure: LEFT PARATHYROID EXPLORATION AND  PARATHYROIDECTOMY;  Surgeon: Armandina Gemma, MD;  Location: [facility];  Service: General;  Laterality: Left;  . post ablation  pseudoaneurysm     at A fib ablation  . pulmonary vein isolation  05/12/2008   RFCA atrial fibrillation  . TONSILLECTOMY      OB History    Gravida Para Term Preterm AB Living   0 0 0 0 0     SAB TAB Ectopic Multiple Live Births   0 0 0           Home Medications    Prior to Admission medications   Medication Sig Start Date End Date Taking? Authorizing Provider  acetaminophen (TYLENOL) 325 MG tablet Take 650 mg by mouth every 6 (six) hours as needed.   Yes [provider]  ADVAIR DISKUS 500-50 MCG/DOSE AEPB Inhale 1 puff into the lungs every 12 (twelve) hours. 05/15/16  Yes [provider]  albuterol (PROVENTIL HFA;VENTOLIN HFA) 108 (90 BASE) MCG/ACT inhaler Inhale 2 puffs into the lungs every 6 (six) hours as needed for wheezing or shortness of breath.   Yes [provider]  atorvastatin (LIPITOR) 80 MG tablet Take 80 mg by mouth at bedtime.     Yes [provider]  colchicine 0.6 MG tablet Take 1-2 tablets by mouth daily as needed (gout). Reported on 03/04/2016 08/09/15  Yes [provider]  diphenhydramine-acetaminophen (TYLENOL PM) 25-500 MG TABS tablet Take 1 tablet by mouth at bedtime as needed (sleep/pain). 01/12/16  Yes Florencia Reasons, MD  ELIQUIS 5 MG TABS tablet TAKE 1 TABLET BY MOUTH TWICE A DAY Patient taking differently: TAKE 5 mg TABLET BY MOUTH TWICE A DAY 08/21/15  Yes Deboraha Sprang, MD  folic acid (FOLVITE) 1 MG tablet Take 1 mg by mouth daily. 06/01/15  Yes [provider]  insulin aspart (NOVOLOG) 100 UNIT/ML injection Inject 10 Units into the skin daily.    Yes [provider]  levothyroxine (SYNTHROID, LEVOTHROID) 100 MCG tablet Take 100 mcg by mouth daily. 05/02/16  Yes [provider]  losartan (COZAAR) 50 MG tablet TAKE 1 TABLET (50 MG TOTAL) BY MOUTH DAILY. 12/06/16  Yes Larey Dresser, MD  Macitentan (OPSUMIT) 10 MG TABS Take 10 mg by mouth daily.   Yes [provider]  metoprolol tartrate  (LOPRESSOR) 50 MG tablet TAKE 1 TABLET (50 MG TOTAL) BY MOUTH 2 (TWO) TIMES DAILY. 02/25/17  Yes Bensimhon, Shaune Pascal, MD  Multiple Vitamins-Minerals (MULTIVITAMIN WITH MINERALS) tablet Take 1 tablet by mouth daily.   Yes [provider]  Multiple Vitamins-Minerals (PRESERVISION AREDS 2 PO) Take 1 tablet by mouth daily.   Yes [provider]  nitroGLYCERIN (NITROSTAT) 0.4 MG SL tablet Place 1 tablet (0.4 mg total) under the tongue every 5 (five) minutes as needed for chest pain. 07/07/14 06/13/17 Yes Deboraha Sprang, MD  nystatin cream (MYCOSTATIN) Apply 1 application topically 2 (two) times daily as needed. For skin irritation. 04/01/16  Yes [provider]  omeprazole (PRILOSEC) 20 MG capsule Take 20 mg by mouth daily.   Yes [provider]  potassium chloride SA (KLOR-CON M20) 20 MEQ tablet Take 1 tablet (20 mEq total) by mouth 2 (two) times daily. Patient taking differently: Take 20 mEq by mouth daily.  10/18/16  Yes Deboraha Sprang, MD  RANEXA 500 MG 12 hr tablet TAKE 1 TABLET TWICE A DAY (SCHEDULE A FOLLOW UP APPOINTMENT) Patient taking differently: TAKE 500 mg TABLET TWICE A DAY (SCHEDULE A FOLLOW UP APPOINTMENT) 04/30/17  Yes Deboraha Sprang, MD  sildenafil (REVATIO) 20 MG tablet Take 2 tablets (40 mg total) by mouth 3 (three) times daily. 06/13/16  Yes Bensimhon, Shaune Pascal, MD  spironolactone (ALDACTONE) 25 MG tablet TAKE 1/2 TABLET BY MOUTH EVERY DAY Patient taking differently: TAKE 12.5 mg TABLET BY MOUTH EVERY DAY 11/04/16  Yes Bensimhon, Shaune Pascal, MD  sulfaSALAzine (AZULFIDINE) 500 MG tablet Take 1,000 mg by mouth 2 (two) times daily. 04/12/16  Yes [provider]  torsemide (DEMADEX) 20 MG tablet Take 2 tablets (40 mg total) by mouth daily. 12/20/16  Yes Bensimhon, Shaune Pascal, MD  cephALEXin (KEFLEX) 500 MG capsule Take 1 capsule (500 mg total) by mouth 3 (three) times daily. 06/01/17 06/08/17  Gareth Morgan, MD  dicyclomine (BENTYL) 20 MG tablet Take 1 tablet  (20 mg total) by mouth 2 (two) times daily as needed for spasms (abdominal pain). 06/01/17   Gareth Morgan, MD  Fluticasone-Salmeterol (ADVAIR) 250-50 MCG/DOSE AEPB Inhale 1 puff into the lungs every 12 (twelve) hours. Patient not taking: Reported on 06/01/2017 01/17/15   Kathee Delton, MD  Tiotropium Bromide Monohydrate (SPIRIVA RESPIMAT) 2.5 MCG/ACT AERS INHALE 2 PUFFS INTO THE LUNGS DAILY. Patient not taking: Reported on 06/01/2017 07/26/16   Brand Males, MD    Family History Family History  Problem Relation Age of Onset  . Breast cancer Mother   .  Heart disease Father   . Emphysema Father     Social History Social History  Substance Use Topics  . Smoking status: Former Smoker    Packs/day: 3.00    Years: 32.00    Quit date: 09/30/1986  . Smokeless tobacco: Former Systems developer    Quit date: 08/15/1987  . Alcohol use No     Allergies   Patient has no active allergies.   Review of Systems Review of Systems  Constitutional: Positive for chills and fever (subjective).  HENT: Negative for sore throat.   Eyes: Negative for visual disturbance.  Respiratory: Negative for cough and shortness of breath.   Cardiovascular: Negative for chest pain.  Gastrointestinal: Positive for abdominal pain. Negative for constipation, diarrhea, nausea and vomiting.  Genitourinary: Negative for difficulty urinating.  Musculoskeletal: Negative for back pain and neck pain.  Skin: Negative for rash.  Neurological: Negative for syncope and headaches.     Physical Exam Updated Vital Signs BP 103/61   Pulse 63   Temp 97.8 F (36.6 C) (Oral)   Resp 16   Ht 5\' 10"  (1.778 m)   Wt 97.1 kg (214 lb)   SpO2 96%   BMI 30.71 kg/m   Physical Exam  Constitutional: She is oriented to person, place, and time. She appears well-developed and well-nourished. No distress.  HENT:  Head: Normocephalic and atraumatic.  Eyes: Conjunctivae and EOM are normal.  Neck: Normal range of motion.  Cardiovascular:  Normal rate, regular rhythm, normal heart sounds and intact distal pulses.  Exam reveals no gallop and no friction rub.   No murmur heard. Pulmonary/Chest: Effort normal and breath sounds normal. No respiratory distress. She has no wheezes. She has no rales.  Abdominal: Soft. She exhibits no distension. There is tenderness (LLQ). There is no guarding.  Musculoskeletal: She exhibits no edema or tenderness.  Neurological: She is alert and oriented to person, place, and time.  Skin: Skin is warm and dry. No rash noted. She is not diaphoretic. No erythema.  Nursing note and vitals reviewed.    ED Treatments / Results  Labs (all labs ordered are listed, but only abnormal results are displayed) Labs Reviewed  COMPREHENSIVE METABOLIC PANEL - Abnormal; Notable for the following:       Result Value   Glucose, Bld 157 (*)    BUN 51 (*)    Creatinine, Ser 1.80 (*)    Calcium 8.6 (*)    GFR calc non Af Amer 26 (*)    GFR calc Af Amer 30 (*)    All other components within normal limits  CBC - Abnormal; Notable for the following:    Hemoglobin 11.1 (*)    HCT 35.1 (*)    RDW 17.6 (*)    All other components within normal limits  URINALYSIS, ROUTINE W REFLEX MICROSCOPIC - Abnormal; Notable for the following:    Protein, ur 100 (*)    Leukocytes, UA TRACE (*)    Bacteria, UA RARE (*)    Squamous Epithelial / LPF 0-5 (*)    All other components within normal limits  LIPASE, BLOOD    EKG  EKG Interpretation None       Radiology Ct Abdomen Pelvis Wo Contrast  Result Date: 06/01/2017 CLINICAL DATA:  LLQ pain since x 1 week No surgery EXAM: CT ABDOMEN AND PELVIS WITHOUT CONTRAST TECHNIQUE: Multidetector CT imaging of the abdomen and pelvis was performed following the standard protocol without IV contrast. COMPARISON:  05/18/2008 FINDINGS: Lower chest: Transvenous pacing  leads partially visualized. Scattered coronary calcifications. Small left pleural effusion. Partially calcified 2.2 cm  pleural-based process in the lateral basal segment left lower lobe. Hepatobiliary: No focal liver abnormality is seen. No gallstones, gallbladder wall thickening, or biliary dilatation. Pancreas: Diffuse parenchymal atrophy. No mass or ductal dilatation. Spleen: Normal in size without focal abnormality. Adrenals/Urinary Tract: Adrenal glands are unremarkable. Kidneys are normal, without renal calculi, focal lesion, or hydronephrosis. Bladder is unremarkable. Stomach/Bowel: Stomach is nondistended. Small bowel decompressed. Normal appendix. The colon is nondilated. Multiple distal descending and sigmoid diverticula without significant adjacent inflammatory/ edematous change. Vascular/Lymphatic: Scattered aortic and iliofemoral arterial calcifications without aneurysm. Reproductive: IUD projects in expected location.  No adnexal mass. Other: No ascites.  No free air. There are inflammatory/edematous changes in the porta hepatis extending to the central mesentery, centered at the level of the main portal vein and CBD. There is an increased number of regional subcentimeter portacaval and porta hepatis lymph nodes. Musculoskeletal: Scattered spondylitic changes in the visualized lower thoracic and lumbar spine. Negative for fracture or worrisome bone lesion. IMPRESSION: 1. Nonspecific periportal inflammatory/edematous changes and adenopathy. Considerations include regional inflammatory processes such as peptic ulcer disease or acute pancreatitis, vs metastatic or lymphoproliferative process. 2. Descending and sigmoid diverticulosis. 3.  Coronary and Aortic Atherosclerosis (ICD10-170.0) 4. Partially calcified process with small effusion at the left lung base, new since previous CT. Given the time interval, the diagnosis is broad including chronic changes such as pleural plaques and rounded atelectasis versus developing neoplasm or aspiration pneumonia. Consider Further evaluation with CT chest with contrast after patient  has been discharged for the acute illness, and can optimally cooperate with positioning and breath-holding instructions. Electronically Signed   By: Lucrezia Europe M.D.   On: 06/01/2017 14:46    Procedures Procedures (including critical care time)  Medications Ordered in ED Medications  sodium chloride 0.9 % bolus 500 mL (0 mLs Intravenous Stopped 06/01/17 1315)  morphine 4 MG/ML injection 4 mg (4 mg Intravenous Given 06/01/17 1216)  ondansetron (ZOFRAN) injection 4 mg (4 mg Intravenous Given 06/01/17 1220)     Initial Impression / Assessment and Plan / ED Course  I have reviewed the triage vital signs and the nursing notes.  Pertinent labs & imaging results that were available during my care of the patient were reviewed by me and considered in my medical decision making (see chart for details).     76 year old female with a history of atrial fibrillation, coronary artery disease, CHF, COPD, diabetes, Crohn's disease, htn, hlpd presents with concern for left lower quadrant abdominal pain.  CT abdomen and pelvis done shows no sign of diverticulitis or other acute intra-abdominal abnormalities. Urinalysis shows 6-30 white blood cells, possible urinary tract infection as etiology of discomfort. Also discussed the could be muscular strain.  Discussed in detail findings with patient and family, recommend patient for primary care physician follow-up. Radiology had discussed nonspecific inflammation representing peptic ulcer disease, pericarditis or other malignancy. I do not feel she has symptoms at this time consistent with peptic ulcer disease, lipase is normal. Chills as calcified left pleural plaque. She has no cough, no shortness of breath, and have low suspicion for pneumonia. Recommend primary care follow-up regarding these findings. Given a prescription for Keflex and bentyl.     Final Clinical Impressions(s) / ED Diagnoses   Final diagnoses:  Abdominal pain, acute, left lower quadrant  Urinary  tract infection without hematuria, site unspecified    New Prescriptions New Prescriptions   CEPHALEXIN (  KEFLEX) 500 MG CAPSULE    Take 1 capsule (500 mg total) by mouth 3 (three) times daily.   DICYCLOMINE (BENTYL) 20 MG TABLET    Take 1 tablet (20 mg total) by mouth 2 (two) times daily as needed for spasms (abdominal pain).     Gareth Morgan, MD 06/01/17 [phone]

## 2017-06-03 ENCOUNTER — Other Ambulatory Visit: Payer: Self-pay | Admitting: Internal Medicine

## 2017-06-26 ENCOUNTER — Other Ambulatory Visit: Payer: Self-pay | Admitting: Internal Medicine

## 2017-06-26 NOTE — Telephone Encounter (Signed)
This is a CHF pt 

## 2017-07-04 ENCOUNTER — Telehealth: Payer: Self-pay | Admitting: Internal Medicine

## 2017-07-04 NOTE — Telephone Encounter (Signed)
New message       *STAT* If patient is at the pharmacy, call can be transferred to refill team.   1. Which medications need to be refilled? (please list name of each medication and dose if known) ranolazine (RANEXA) 500 MG 12 hr tablet  2. Which pharmacy/location (including street and city if local pharmacy) is medication to be sent to? [facility]  3. Do they need a 30 day or 90 day supply? 90 day

## 2017-07-07 ENCOUNTER — Other Ambulatory Visit (HOSPITAL_COMMUNITY): Payer: Self-pay | Admitting: *Deleted

## 2017-07-07 MED ORDER — RANOLAZINE ER 500 MG PO TB12: ORAL_TABLET | ORAL | 3 refills | Status: DC

## 2017-07-24 ENCOUNTER — Other Ambulatory Visit (HOSPITAL_COMMUNITY): Payer: Self-pay | Admitting: Internal Medicine

## 2017-07-29 ENCOUNTER — Other Ambulatory Visit (HOSPITAL_COMMUNITY): Payer: Self-pay | Admitting: *Deleted

## 2017-07-29 ENCOUNTER — Other Ambulatory Visit: Payer: Self-pay | Admitting: Internal Medicine

## 2017-07-29 MED ORDER — APIXABAN 5 MG PO TABS: 5.0000 mg | ORAL_TABLET | Freq: Two times a day (BID) | ORAL | 5 refills | Status: DC

## 2017-08-26 ENCOUNTER — Other Ambulatory Visit (HOSPITAL_COMMUNITY): Payer: Self-pay | Admitting: *Deleted

## 2017-08-26 MED ORDER — METOPROLOL TARTRATE 50 MG PO TABS: 50.0000 mg | ORAL_TABLET | Freq: Two times a day (BID) | ORAL | 3 refills | Status: DC

## 2017-09-10 ENCOUNTER — Encounter: Payer: Self-pay | Admitting: Internal Medicine

## 2017-09-12 ENCOUNTER — Encounter: Payer: Self-pay | Admitting: Internal Medicine

## 2017-09-14 ENCOUNTER — Other Ambulatory Visit: Payer: Self-pay | Admitting: Internal Medicine

## 2017-09-15 NOTE — Telephone Encounter (Signed)
This is a CHF pt 

## 2017-09-24 ENCOUNTER — Encounter: Payer: Self-pay | Admitting: Internal Medicine

## 2017-10-10 ENCOUNTER — Telehealth (HOSPITAL_COMMUNITY): Payer: Self-pay | Admitting: Pharmacist

## 2017-10-10 NOTE — Telephone Encounter (Signed)
Sildenafil #6 tablets per day PA approved by [employer] through 09/29/18.   Ruta Hinds. Velva Harman, PharmD, BCPS, CPP Clinical Pharmacist Phone: [phone] 10/10/2017 11:59 AM

## 2017-11-06 ENCOUNTER — Other Ambulatory Visit (HOSPITAL_COMMUNITY): Payer: Self-pay | Admitting: *Deleted

## 2017-11-06 MED ORDER — RANOLAZINE ER 500 MG PO TB12: ORAL_TABLET | ORAL | 3 refills | Status: DC

## 2017-11-21 ENCOUNTER — Other Ambulatory Visit (HOSPITAL_COMMUNITY): Payer: Self-pay | Admitting: *Deleted

## 2017-11-21 MED ORDER — SPIRONOLACTONE 25 MG PO TABS: 12.5000 mg | ORAL_TABLET | Freq: Every day | ORAL | 3 refills | Status: AC

## 2017-12-12 ENCOUNTER — Encounter (HOSPITAL_COMMUNITY): Payer: Self-pay | Admitting: Internal Medicine

## 2017-12-12 ENCOUNTER — Ambulatory Visit (HOSPITAL_COMMUNITY)
Admission: RE | Admit: 2017-12-12 | Discharge: 2017-12-12 | Disposition: A | Discharge status: Home / Self Care | Payer: Medicare Other | Source: Ambulatory Visit | Attending: Gastroenterology | Admitting: Gastroenterology

## 2017-12-12 DIAGNOSIS — D500 Iron deficiency anemia secondary to blood loss (chronic): Secondary | ICD-10-CM | POA: Insufficient documentation

## 2017-12-12 DIAGNOSIS — K921 Melena: Secondary | ICD-10-CM | POA: Insufficient documentation

## 2017-12-12 MED ORDER — SODIUM CHLORIDE 0.9 % IV SOLN
INTRAVENOUS | Status: DC
  Administered 2017-12-12: 11:00:00 via INTRAVENOUS

## 2017-12-12 MED ORDER — SODIUM CHLORIDE 0.9 % IV SOLN
510.0000 mg | Freq: Once | INTRAVENOUS | Status: AC
  Administered 2017-12-12: 510 mg via INTRAVENOUS
  Filled 2017-12-12: qty 17

## 2017-12-12 NOTE — Progress Notes (Signed)
Pt received IV Feraheme infusion at [facility] today. Infusion completed without complications.  Pt monitored for 30 min post infusion; no signs of reaction noted.  Discharged to home in stable condition.  Coolidge Breeze, RN 12/12/2017

## 2017-12-12 NOTE — Discharge Instructions (Signed)

## 2017-12-19 ENCOUNTER — Ambulatory Visit (HOSPITAL_COMMUNITY)
Admission: RE | Admit: 2017-12-19 | Discharge: 2017-12-19 | Disposition: A | Discharge status: Home / Self Care | Payer: Medicare Other | Source: Ambulatory Visit | Attending: Gastroenterology | Admitting: Gastroenterology

## 2017-12-19 DIAGNOSIS — D500 Iron deficiency anemia secondary to blood loss (chronic): Secondary | ICD-10-CM | POA: Diagnosis not present

## 2017-12-19 MED ORDER — SODIUM CHLORIDE 0.9 % IV SOLN
510.0000 mg | Freq: Once | INTRAVENOUS | Status: AC
  Administered 2017-12-19: 510 mg via INTRAVENOUS
  Filled 2017-12-19: qty 17

## 2017-12-19 NOTE — Discharge Instructions (Signed)

## 2017-12-19 NOTE — Progress Notes (Signed)
PATIENT CARE CENTER NOTE  Diagnosis: Iron Deficiency Anemia    Provider: Dr. Laurence Spates    Procedure: IV Feraheme    Note: Patient received IV Feraheme infusion. Patient tolerated infusion well without complication. Patient monitored for 30 minutes post infusion. Discharge instructions given to patient. Patient discharge home in stable condition.

## 2017-12-20 ENCOUNTER — Other Ambulatory Visit (HOSPITAL_COMMUNITY): Payer: Self-pay | Admitting: Internal Medicine

## 2017-12-22 ENCOUNTER — Encounter: Payer: Self-pay | Admitting: Internal Medicine

## 2017-12-25 ENCOUNTER — Emergency Department (HOSPITAL_COMMUNITY): Payer: Medicare Other

## 2017-12-25 ENCOUNTER — Other Ambulatory Visit: Payer: Self-pay

## 2017-12-25 ENCOUNTER — Inpatient Hospital Stay (HOSPITAL_COMMUNITY)
Admission: EM | Admit: 2017-12-25 | Discharge: 2018-01-08 | DRG: 291 | Disposition: A | Discharge status: Skilled Nursing Facility | Payer: Medicare Other | Attending: Family Medicine | Admitting: Family Medicine

## 2017-12-25 ENCOUNTER — Encounter (HOSPITAL_COMMUNITY): Payer: Self-pay

## 2017-12-25 DIAGNOSIS — K50111 Crohn's disease of large intestine with rectal bleeding: Secondary | ICD-10-CM | POA: Diagnosis present

## 2017-12-25 DIAGNOSIS — K921 Melena: Secondary | ICD-10-CM | POA: Diagnosis not present

## 2017-12-25 DIAGNOSIS — T502X5A Adverse effect of carbonic-anhydrase inhibitors, benzothiadiazides and other diuretics, initial encounter: Secondary | ICD-10-CM | POA: Diagnosis present

## 2017-12-25 DIAGNOSIS — N179 Acute kidney failure, unspecified: Secondary | ICD-10-CM | POA: Diagnosis not present

## 2017-12-25 DIAGNOSIS — G4733 Obstructive sleep apnea (adult) (pediatric): Secondary | ICD-10-CM | POA: Diagnosis present

## 2017-12-25 DIAGNOSIS — R609 Edema, unspecified: Secondary | ICD-10-CM | POA: Diagnosis present

## 2017-12-25 DIAGNOSIS — Z7901 Long term (current) use of anticoagulants: Secondary | ICD-10-CM

## 2017-12-25 DIAGNOSIS — Z79890 Hormone replacement therapy (postmenopausal): Secondary | ICD-10-CM

## 2017-12-25 DIAGNOSIS — R05 Cough: Secondary | ICD-10-CM

## 2017-12-25 DIAGNOSIS — D631 Anemia in chronic kidney disease: Secondary | ICD-10-CM | POA: Diagnosis present

## 2017-12-25 DIAGNOSIS — I482 Chronic atrial fibrillation, unspecified: Secondary | ICD-10-CM | POA: Diagnosis present

## 2017-12-25 DIAGNOSIS — J206 Acute bronchitis due to rhinovirus: Secondary | ICD-10-CM | POA: Diagnosis present

## 2017-12-25 DIAGNOSIS — R509 Fever, unspecified: Secondary | ICD-10-CM

## 2017-12-25 DIAGNOSIS — Z87891 Personal history of nicotine dependence: Secondary | ICD-10-CM

## 2017-12-25 DIAGNOSIS — Z961 Presence of intraocular lens: Secondary | ICD-10-CM | POA: Diagnosis present

## 2017-12-25 DIAGNOSIS — M109 Gout, unspecified: Secondary | ICD-10-CM | POA: Diagnosis present

## 2017-12-25 DIAGNOSIS — E871 Hypo-osmolality and hyponatremia: Secondary | ICD-10-CM | POA: Diagnosis present

## 2017-12-25 DIAGNOSIS — I081 Rheumatic disorders of both mitral and tricuspid valves: Secondary | ICD-10-CM | POA: Diagnosis present

## 2017-12-25 DIAGNOSIS — Z9841 Cataract extraction status, right eye: Secondary | ICD-10-CM

## 2017-12-25 DIAGNOSIS — Z8249 Family history of ischemic heart disease and other diseases of the circulatory system: Secondary | ICD-10-CM

## 2017-12-25 DIAGNOSIS — J439 Emphysema, unspecified: Secondary | ICD-10-CM | POA: Diagnosis present

## 2017-12-25 DIAGNOSIS — I50813 Acute on chronic right heart failure: Secondary | ICD-10-CM

## 2017-12-25 DIAGNOSIS — Z825 Family history of asthma and other chronic lower respiratory diseases: Secondary | ICD-10-CM

## 2017-12-25 DIAGNOSIS — J441 Chronic obstructive pulmonary disease with (acute) exacerbation: Secondary | ICD-10-CM | POA: Diagnosis present

## 2017-12-25 DIAGNOSIS — Z452 Encounter for adjustment and management of vascular access device: Secondary | ICD-10-CM

## 2017-12-25 DIAGNOSIS — N183 Chronic kidney disease, stage 3 unspecified: Secondary | ICD-10-CM | POA: Diagnosis present

## 2017-12-25 DIAGNOSIS — D72829 Elevated white blood cell count, unspecified: Secondary | ICD-10-CM

## 2017-12-25 DIAGNOSIS — E669 Obesity, unspecified: Secondary | ICD-10-CM | POA: Diagnosis present

## 2017-12-25 DIAGNOSIS — R0989 Other specified symptoms and signs involving the circulatory and respiratory systems: Secondary | ICD-10-CM

## 2017-12-25 DIAGNOSIS — I959 Hypotension, unspecified: Secondary | ICD-10-CM | POA: Diagnosis not present

## 2017-12-25 DIAGNOSIS — I130 Hypertensive heart and chronic kidney disease with heart failure and stage 1 through stage 4 chronic kidney disease, or unspecified chronic kidney disease: Secondary | ICD-10-CM | POA: Diagnosis not present

## 2017-12-25 DIAGNOSIS — Z7951 Long term (current) use of inhaled steroids: Secondary | ICD-10-CM

## 2017-12-25 DIAGNOSIS — I5082 Biventricular heart failure: Secondary | ICD-10-CM | POA: Diagnosis present

## 2017-12-25 DIAGNOSIS — Z9981 Dependence on supplemental oxygen: Secondary | ICD-10-CM

## 2017-12-25 DIAGNOSIS — R519 Headache, unspecified: Secondary | ICD-10-CM

## 2017-12-25 DIAGNOSIS — I4891 Unspecified atrial fibrillation: Secondary | ICD-10-CM

## 2017-12-25 DIAGNOSIS — I2729 Other secondary pulmonary hypertension: Secondary | ICD-10-CM | POA: Diagnosis present

## 2017-12-25 DIAGNOSIS — E1121 Type 2 diabetes mellitus with diabetic nephropathy: Secondary | ICD-10-CM | POA: Diagnosis present

## 2017-12-25 DIAGNOSIS — K5010 Crohn's disease of large intestine without complications: Secondary | ICD-10-CM | POA: Diagnosis present

## 2017-12-25 DIAGNOSIS — R059 Cough, unspecified: Secondary | ICD-10-CM

## 2017-12-25 DIAGNOSIS — M1A9XX0 Chronic gout, unspecified, without tophus (tophi): Secondary | ICD-10-CM | POA: Diagnosis present

## 2017-12-25 DIAGNOSIS — E1122 Type 2 diabetes mellitus with diabetic chronic kidney disease: Secondary | ICD-10-CM | POA: Diagnosis present

## 2017-12-25 DIAGNOSIS — E785 Hyperlipidemia, unspecified: Secondary | ICD-10-CM | POA: Diagnosis present

## 2017-12-25 DIAGNOSIS — R0902 Hypoxemia: Secondary | ICD-10-CM | POA: Diagnosis present

## 2017-12-25 DIAGNOSIS — E039 Hypothyroidism, unspecified: Secondary | ICD-10-CM | POA: Diagnosis present

## 2017-12-25 DIAGNOSIS — I5043 Acute on chronic combined systolic (congestive) and diastolic (congestive) heart failure: Secondary | ICD-10-CM | POA: Diagnosis present

## 2017-12-25 DIAGNOSIS — R531 Weakness: Secondary | ICD-10-CM

## 2017-12-25 DIAGNOSIS — Z9842 Cataract extraction status, left eye: Secondary | ICD-10-CM

## 2017-12-25 DIAGNOSIS — E1151 Type 2 diabetes mellitus with diabetic peripheral angiopathy without gangrene: Secondary | ICD-10-CM | POA: Diagnosis present

## 2017-12-25 DIAGNOSIS — I428 Other cardiomyopathies: Secondary | ICD-10-CM | POA: Diagnosis present

## 2017-12-25 DIAGNOSIS — R51 Headache: Secondary | ICD-10-CM | POA: Diagnosis not present

## 2017-12-25 DIAGNOSIS — M810 Age-related osteoporosis without current pathological fracture: Secondary | ICD-10-CM | POA: Diagnosis present

## 2017-12-25 DIAGNOSIS — I2510 Atherosclerotic heart disease of native coronary artery without angina pectoris: Secondary | ICD-10-CM | POA: Diagnosis present

## 2017-12-25 DIAGNOSIS — D649 Anemia, unspecified: Secondary | ICD-10-CM

## 2017-12-25 DIAGNOSIS — Z794 Long term (current) use of insulin: Secondary | ICD-10-CM

## 2017-12-25 DIAGNOSIS — Z6830 Body mass index (BMI) 30.0-30.9, adult: Secondary | ICD-10-CM

## 2017-12-25 DIAGNOSIS — E876 Hypokalemia: Secondary | ICD-10-CM | POA: Diagnosis present

## 2017-12-25 DIAGNOSIS — Z950 Presence of cardiac pacemaker: Secondary | ICD-10-CM

## 2017-12-25 DIAGNOSIS — J302 Other seasonal allergic rhinitis: Secondary | ICD-10-CM | POA: Diagnosis present

## 2017-12-25 DIAGNOSIS — K219 Gastro-esophageal reflux disease without esophagitis: Secondary | ICD-10-CM | POA: Diagnosis present

## 2017-12-25 DIAGNOSIS — Z79899 Other long term (current) drug therapy: Secondary | ICD-10-CM

## 2017-12-25 DIAGNOSIS — I480 Paroxysmal atrial fibrillation: Secondary | ICD-10-CM | POA: Diagnosis present

## 2017-12-25 DIAGNOSIS — J9811 Atelectasis: Secondary | ICD-10-CM | POA: Diagnosis present

## 2017-12-25 LAB — I-STAT TROPONIN, ED: TROPONIN I, POC: 0.07 ng/mL (ref 0.00–0.08)

## 2017-12-25 LAB — CBC
HCT: 39.2 % (ref 36.0–46.0)
Hemoglobin: 11.2 g/dL — ABNORMAL LOW (ref 12.0–15.0)
MCH: 25.7 pg — ABNORMAL LOW (ref 26.0–34.0)
MCHC: 28.6 g/dL — AB (ref 30.0–36.0)
MCV: 90.1 fL (ref 78.0–100.0)
PLATELETS: 383 10*3/uL (ref 150–400)
RBC: 4.35 MIL/uL (ref 3.87–5.11)
RDW: 27.2 % — AB (ref 11.5–15.5)
WBC: 8.1 10*3/uL (ref 4.0–10.5)

## 2017-12-25 LAB — BASIC METABOLIC PANEL
Anion gap: 12 (ref 5–15)
BUN: 43 mg/dL — AB (ref 6–20)
CALCIUM: 8 mg/dL — AB (ref 8.9–10.3)
CO2: 28 mmol/L (ref 22–32)
Chloride: 98 mmol/L — ABNORMAL LOW (ref 101–111)
Creatinine, Ser: 1.67 mg/dL — ABNORMAL HIGH (ref 0.44–1.00)
GFR calc Af Amer: 33 mL/min — ABNORMAL LOW (ref >60)
GFR, EST NON AFRICAN AMERICAN: 28 mL/min — AB (ref >60)
GLUCOSE: 106 mg/dL — AB (ref 65–99)
Potassium: 3.4 mmol/L — ABNORMAL LOW (ref 3.5–5.1)
SODIUM: 138 mmol/L (ref 135–145)

## 2017-12-25 LAB — BRAIN NATRIURETIC PEPTIDE: B Natriuretic Peptide: 2089.7 pg/mL — ABNORMAL HIGH (ref 0.0–100.0)

## 2017-12-25 MED ORDER — LOSARTAN POTASSIUM 50 MG PO TABS
50.0000 mg | ORAL_TABLET | Freq: Every day | ORAL | Status: DC
  Administered 2017-12-26 – 2017-12-27 (×2): 50 mg via ORAL
  Filled 2017-12-25 (×3): qty 1

## 2017-12-25 MED ORDER — PROSIGHT PO TABS
1.0000 | ORAL_TABLET | Freq: Every day | ORAL | Status: DC
  Administered 2017-12-27 – 2018-01-08 (×13): 1 via ORAL
  Filled 2017-12-25 (×15): qty 1

## 2017-12-25 MED ORDER — TIOTROPIUM BROMIDE MONOHYDRATE 18 MCG IN CAPS
18.0000 ug | ORAL_CAPSULE | Freq: Every day | RESPIRATORY_TRACT | Status: DC
  Administered 2017-12-26 – 2018-01-08 (×14): 18 ug via RESPIRATORY_TRACT
  Filled 2017-12-25 (×4): qty 5

## 2017-12-25 MED ORDER — RANOLAZINE ER 500 MG PO TB12
500.0000 mg | ORAL_TABLET | Freq: Two times a day (BID) | ORAL | Status: DC
  Administered 2017-12-26 – 2018-01-08 (×28): 500 mg via ORAL
  Filled 2017-12-25 (×28): qty 1

## 2017-12-25 MED ORDER — POTASSIUM CHLORIDE CRYS ER 20 MEQ PO TBCR
20.0000 meq | EXTENDED_RELEASE_TABLET | Freq: Once | ORAL | Status: AC
  Administered 2017-12-26: 20 meq via ORAL
  Filled 2017-12-25: qty 1

## 2017-12-25 MED ORDER — MACITENTAN 10 MG PO TABS
10.0000 mg | ORAL_TABLET | Freq: Every day | ORAL | Status: DC
  Administered 2017-12-27 – 2018-01-08 (×13): 10 mg via ORAL
  Filled 2017-12-25 (×15): qty 1

## 2017-12-25 MED ORDER — SULFASALAZINE 500 MG PO TABS
1000.0000 mg | ORAL_TABLET | Freq: Two times a day (BID) | ORAL | Status: DC
  Administered 2017-12-26 – 2018-01-08 (×27): 1000 mg via ORAL
  Filled 2017-12-25 (×31): qty 2

## 2017-12-25 MED ORDER — FOLIC ACID 1 MG PO TABS
1.0000 mg | ORAL_TABLET | Freq: Every day | ORAL | Status: DC
  Administered 2017-12-26 – 2018-01-08 (×14): 1 mg via ORAL
  Filled 2017-12-25 (×14): qty 1

## 2017-12-25 MED ORDER — ALBUTEROL SULFATE HFA 108 (90 BASE) MCG/ACT IN AERS: 2.0000 | INHALATION_SPRAY | Freq: Four times a day (QID) | RESPIRATORY_TRACT | Status: DC | PRN

## 2017-12-25 MED ORDER — POTASSIUM CHLORIDE CRYS ER 20 MEQ PO TBCR
20.0000 meq | EXTENDED_RELEASE_TABLET | Freq: Every day | ORAL | Status: DC
  Administered 2017-12-26: 20 meq via ORAL
  Filled 2017-12-25: qty 1

## 2017-12-25 MED ORDER — ATORVASTATIN CALCIUM 80 MG PO TABS
80.0000 mg | ORAL_TABLET | Freq: Every day | ORAL | Status: DC
  Administered 2017-12-26 – 2018-01-07 (×14): 80 mg via ORAL
  Filled 2017-12-25 (×14): qty 1

## 2017-12-25 MED ORDER — SILDENAFIL CITRATE 20 MG PO TABS
40.0000 mg | ORAL_TABLET | Freq: Three times a day (TID) | ORAL | Status: DC
  Administered 2017-12-26 – 2018-01-08 (×41): 40 mg via ORAL
  Filled 2017-12-25 (×44): qty 2

## 2017-12-25 MED ORDER — FLUTICASONE FUROATE-VILANTEROL 200-25 MCG/INH IN AEPB
1.0000 | INHALATION_SPRAY | Freq: Every day | RESPIRATORY_TRACT | Status: DC
  Administered 2017-12-26 – 2018-01-08 (×13): 1 via RESPIRATORY_TRACT
  Filled 2017-12-25 (×2): qty 28

## 2017-12-25 MED ORDER — ADULT MULTIVITAMIN W/MINERALS CH
1.0000 | ORAL_TABLET | Freq: Every day | ORAL | Status: DC
  Administered 2017-12-26 – 2018-01-08 (×14): 1 via ORAL
  Filled 2017-12-25 (×14): qty 1

## 2017-12-25 MED ORDER — LEVOTHYROXINE SODIUM 100 MCG PO TABS
100.0000 ug | ORAL_TABLET | Freq: Every day | ORAL | Status: DC
  Administered 2017-12-26 – 2017-12-30 (×5): 100 ug via ORAL
  Filled 2017-12-25 (×6): qty 1

## 2017-12-25 MED ORDER — APIXABAN 5 MG PO TABS
5.0000 mg | ORAL_TABLET | Freq: Two times a day (BID) | ORAL | Status: DC
  Administered 2017-12-26 – 2018-01-04 (×21): 5 mg via ORAL
  Filled 2017-12-25 (×22): qty 1

## 2017-12-25 MED ORDER — METOPROLOL TARTRATE 50 MG PO TABS
50.0000 mg | ORAL_TABLET | Freq: Two times a day (BID) | ORAL | Status: DC
  Administered 2017-12-26 – 2017-12-31 (×11): 50 mg via ORAL
  Filled 2017-12-25: qty 1
  Filled 2017-12-25: qty 2
  Filled 2017-12-25 (×9): qty 1
  Filled 2017-12-25: qty 2
  Filled 2017-12-25: qty 1

## 2017-12-25 MED ORDER — COLCHICINE 0.6 MG PO TABS
0.6000 mg | ORAL_TABLET | Freq: Every day | ORAL | Status: DC | PRN
  Administered 2017-12-29: 0.6 mg via ORAL
  Filled 2017-12-25: qty 1

## 2017-12-25 MED ORDER — FUROSEMIDE 10 MG/ML IJ SOLN
40.0000 mg | Freq: Every day | INTRAMUSCULAR | Status: DC
  Administered 2017-12-26: 40 mg via INTRAVENOUS
  Filled 2017-12-25: qty 4

## 2017-12-25 MED ORDER — FUROSEMIDE 10 MG/ML IJ SOLN
40.0000 mg | Freq: Once | INTRAMUSCULAR | Status: AC
  Administered 2017-12-25: 40 mg via INTRAVENOUS
  Filled 2017-12-25: qty 4

## 2017-12-25 MED ORDER — SPIRONOLACTONE 12.5 MG HALF TABLET
12.5000 mg | ORAL_TABLET | Freq: Every day | ORAL | Status: DC
  Administered 2017-12-26 – 2017-12-31 (×6): 12.5 mg via ORAL
  Filled 2017-12-25 (×7): qty 1

## 2017-12-25 MED ORDER — PANTOPRAZOLE SODIUM 40 MG PO TBEC
40.0000 mg | DELAYED_RELEASE_TABLET | Freq: Every day | ORAL | Status: DC
  Administered 2017-12-26 – 2018-01-05 (×11): 40 mg via ORAL
  Filled 2017-12-25 (×12): qty 1

## 2017-12-25 NOTE — ED Notes (Signed)
purewick in place.

## 2017-12-25 NOTE — ED Notes (Signed)
Pt reports for the past 2 weeks she has been feeling bad.  Went to see her PCP, found to have low Fe.  Today, she went back d/t continued fatigue but her Fe is WNL.  Family at bedside reports pt have not been active for 2 weeks and is worsening.  Pt is A&Ox4.  Positive for +2 bila LE pitting edema.  No pain.  She reports SOB with exertion and when lying flat.  Hx of CHF.

## 2017-12-25 NOTE — ED Provider Notes (Signed)
Patient placed in Quick Look pathway, seen and evaluated  Chief Complaint: lower extremity edema  HPI:  [name] is a 77 y.o. female who presents to the ED with a family member for swelling to bilateral lower extremities x 3 days. Patient has a hx of CHF and takes torsemide daily.   ROS: M/S: lower extremity edema  Physical Exam:  BP 106/85 (BP Location: Left Arm)   Pulse 65   Temp 99.1 F (37.3 C) (Oral)   Resp 18   Ht 5\' 10"  (1.778 m)   Wt 98.4 kg (217 lb)   SpO2 99%   BMI 31.14 kg/m    Gen: No distress  Neuro: Awake and Alert  Skin: Warm and dry  Resp: rales right lower lung      Focused Exam:    Initiation of care has begun. The patient has been counseled on the process, plan, and necessity for staying for the completion/evaluation, and the remainder of the medical screening examination    Ashley Murrain, NP 12/25/17 1727    Macarthur Critchley, MD 12/26/17 1927

## 2017-12-25 NOTE — ED Triage Notes (Signed)
PT reports BLE swelling and sob x 3 days. PT has hx of CHF and takes torsemide daily. Denies CP

## 2017-12-25 NOTE — H&P (Addendum)
TRH H&P   Patient Demographics:    [name], is a 77 y.o. female  MRN: [id number]   DOB - 1941-04-24  Admit Date - 12/25/2017  Outpatient Primary MD for the patient is [name], MD  Referring MD/NP/PA:  Irena Cords  Outpatient Specialists:   Patient coming from: home  Chief Complaint  Patient presents with  . Edema      HPI:    [name]  is a 77 y.o. female, w Dm2, Hypertension, Hyperlipidemia, Hypothyroidism, OSA, CAD, Pafib, CHF (EF 55%), apparently has been feeling weak for the past several weeks,  Sent for iron transfusion to GI by pcp.  Pt was apparently seen by GI today and sent to ER for evaluation of edema. Pt notes slight dyspnea.  Denies fever, chills, cp, palp, n/v, diarrhea, brbpr.   In Ed,  CXR IMPRESSION: Small left pleural effusion with mild left base atelectasis. No edema or consolidation. Cardiomegaly. Calcification in the left anterior descending coronary artery. Pacemaker leads as described. No pneumothorax.  Na 138, K 3.4, Bun 43, Creatinine 1.67,  Wbc 8.1, Hgb 11.2, Plt 383 BNP 2,089.7 Trop I 0.07  Pt will be admitted for w/up of edema.      Review of systems:    In addition to the HPI above,  No Fever-chills, No Headache, No changes with Vision or hearing, No problems swallowing food or Liquids, No Chest pain, Cough No Abdominal pain, No Nausea or Vommitting, Bowel movements are regular, No Blood in stool or Urine, No dysuria, No new skin rashes or bruises, No new joints pains-aches,  No new weakness, tingling, numbness in any extremity, No recent weight gain or loss, No polyuria, polydypsia or polyphagia, No significant Mental Stressors.  A full 10 point Review of Systems was done, except as stated above, all other Review of Systems were negative.   With Past History of the following :    Past Medical History:    Diagnosis Date  . Atrial fibrillation ([place])    Status post pulmonary vein isolation 2009 at [facility]  . Atrial fibrillation ([place]) 12/18/2007   Annotation: refractory Qualifier: Diagnosis of  By: Doy Mince LPN, Megan    . CAD (coronary artery disease)    LHC 05/2007: pLAD 70-80%, pRCA 40%, EF 20-25%. LAD lesion treated medically.   . CHF (congestive heart failure) ([place])   . Chronic diastolic heart failure ([place])    Echo 06/2010: Moderate LVH, EF 55-65%, normal wall motion, mild MR, moderate to severe LAE, mild RAE.   Marland Kitchen Chronic obstructive pulmonary disease ([place])   . COPD (chronic obstructive pulmonary disease) ([place])   . Coronary artery disease 06/15/2012  . Crohn's disease ([place])   . Depression   . Diabetes mellitus ([place]) 06/22/2012  . Diabetes mellitus without complication ([place])   . Dysrhythmia    atrial fib  . GERD (gastroesophageal reflux disease)   .  Gout   . Hyperlipidemia   . Hypothyroidism    treated  . Morbid obesity ([place])   . Morbid obesity with BMI of 40.0-44.9, adult ([place]) 06/22/2012  . Nonischemic cardiomyopathy ([place])    history of,  EF 20-25% at left heart cath in 06/2007; echo 2011 had normal LV function  . Obstructive sleep apnea    continuous positive airway pressure not using at present  . On home oxygen therapy    "2L at night" (10/03/2015)  . Osteoporosis   . Pacemaker    Medtronic  . Peripheral vascular disease (HCC) 15   rt arm clots  . Seasonal allergies   . Shortness of breath dyspnea    exersion  . Tachy-brady syndrome ([place])    status post implant of a medtronic dual-chamber pacemaker in 2001.  explanted in 2010 -- Holcomb pacemaker in 2010.      Past Surgical History:  Procedure Laterality Date  . CARDIAC CATHETERIZATION  08/2000   noncritical disease mid RCA, EF preserved  . CARDIAC CATHETERIZATION  05/29/2007   noncritical disease, EF 20-25%  . CARDIAC CATHETERIZATION N/A 01/09/2016   Procedure: Right Heart Cath;  Surgeon:  Jolaine Artist, MD;  Location: [place] [facility];  Service: Cardiovascular;  Laterality: N/A;  . CARDIAC CATHETERIZATION N/A 07/15/2016   Procedure: Right Heart Cath;  Surgeon: Jolaine Artist, MD;  Location: [place] [facility];  Service: Cardiovascular;  Laterality: N/A;  . CATARACT EXTRACTION W/ INTRAOCULAR LENS  IMPLANT, BILATERAL Bilateral 2014-2016  . EMBOLECTOMY Right 08/16/2014   Procedure: EMBOLECTOMY- RIGHT BRACHIAL ARTERY;  Surgeon: Serafina Mitchell, MD;  Location: [facility];  Service: Vascular;  Laterality: Right;  . PACEMAKER INSERTION  2010   medtronic ADAPTA   . PARATHYROIDECTOMY Right 01/30/2015   Procedure: PARATHYROIDECTOMY;  Surgeon: Armandina Gemma, MD;  Location: [facility];  Service: General;  Laterality: Right;  . PARATHYROIDECTOMY Left 10/03/2015   Left inferior parathyroidectomy w/neck exploration  . PARATHYROIDECTOMY Left 10/03/2015   Procedure: LEFT PARATHYROID EXPLORATION AND  PARATHYROIDECTOMY;  Surgeon: Armandina Gemma, MD;  Location: [place];  Service: General;  Laterality: Left;  . post ablation pseudoaneurysm     at A fib ablation  . pulmonary vein isolation  05/12/2008   RFCA atrial fibrillation  . TONSILLECTOMY        Social History:     Social History   Tobacco Use  . Smoking status: Former Smoker    Packs/day: 3.00    Years: 32.00    Pack years: 96.00    Last attempt to quit: 09/30/1986    Years since quitting: 31.2  . Smokeless tobacco: Former Systems developer    Quit date: 08/15/1987  Substance Use Topics  . Alcohol use: No    Alcohol/week: 0.0 oz     Lives - at home  Mobility - walks by self   Family History :     Family History  Problem Relation Age of Onset  . Breast cancer Mother   . Heart disease Father   . Emphysema Father       Home Medications:   Prior to Admission medications   Medication Sig Start Date End Date Taking? Authorizing Provider  ADVAIR DISKUS 500-50 MCG/DOSE AEPB Inhale 1 puff into the lungs every 12 (twelve) hours. 05/15/16  Yes  [provider]  albuterol (PROVENTIL HFA;VENTOLIN HFA) 108 (90 BASE) MCG/ACT inhaler Inhale 2 puffs into the lungs every 6 (six) hours as needed for wheezing or shortness of  breath.   Yes [provider]  apixaban (ELIQUIS) 5 MG TABS tablet Take 1 tablet (5 mg total) by mouth 2 (two) times daily. 07/29/17  Yes Bensimhon, Shaune Pascal, MD  atorvastatin (LIPITOR) 80 MG tablet Take 80 mg by mouth at bedtime.     Yes [provider]  colchicine 0.6 MG tablet Take 0.6-1.2 mg by mouth daily as needed (for gout flares). Reported on 03/04/2016 08/09/15  Yes [provider]  diphenhydramine-acetaminophen (TYLENOL PM) 25-500 MG TABS tablet Take 1 tablet by mouth at bedtime as needed (sleep/pain). 01/12/16  Yes Florencia Reasons, MD  folic acid (FOLVITE) 1 MG tablet Take 1 mg by mouth daily. 06/01/15  Yes [provider]  KLOR-CON M20 20 MEQ tablet TAKE 1 TABLET (20 MEQ TOTAL) BY MOUTH 2 (TWO) TIMES DAILY. Patient taking differently: Take 20 mEq by mouth once a day 10/23/17  Yes Larey Dresser, MD  LANTUS 100 UNIT/ML injection Inject 10 Units into the skin daily before breakfast.  12/12/17  Yes [provider]  levothyroxine (SYNTHROID, LEVOTHROID) 100 MCG tablet Take 100 mcg by mouth daily. 05/02/16  Yes [provider]  losartan (COZAAR) 50 MG tablet TAKE 1 TABLET (50 MG TOTAL) BY MOUTH DAILY. 12/06/16  Yes Larey Dresser, MD  Macitentan (OPSUMIT) 10 MG TABS Take 10 mg by mouth daily.   Yes [provider]  metoprolol tartrate (LOPRESSOR) 50 MG tablet Take 1 tablet (50 mg total) by mouth 2 (two) times daily. 08/26/17  Yes Larey Dresser, MD  Multiple Vitamins-Minerals (MULTIVITAMIN WITH MINERALS) tablet Take 1 tablet by mouth daily.   Yes [provider]  Multiple Vitamins-Minerals (PRESERVISION AREDS 2 PO) Take 1 tablet by mouth 2 (two) times daily.    Yes [provider]  nystatin cream (MYCOSTATIN) Apply 1 application topically 2 (two)  times daily as needed (for skin irritation).  04/01/16  Yes [provider]  omeprazole (PRILOSEC) 20 MG capsule Take 20 mg by mouth daily.   Yes [provider]  ranolazine (RANEXA) 500 MG 12 hr tablet Take 1 tablet by mouth twice daily. Patient taking differently: Take 500 mg by mouth 2 (two) times daily.  11/06/17  Yes Bensimhon, Shaune Pascal, MD  sildenafil (REVATIO) 20 MG tablet Take 2 tablets (40 mg total) by mouth 3 (three) times daily. 06/13/16  Yes Bensimhon, Shaune Pascal, MD  spironolactone (ALDACTONE) 25 MG tablet Take 0.5 tablets (12.5 mg total) by mouth daily. 11/21/17  Yes Bensimhon, Shaune Pascal, MD  sulfaSALAzine (AZULFIDINE) 500 MG tablet Take 1,000 mg by mouth 2 (two) times daily. 04/12/16  Yes [provider]  Tiotropium Bromide Monohydrate (SPIRIVA RESPIMAT) 2.5 MCG/ACT AERS INHALE 2 PUFFS INTO THE LUNGS DAILY. 07/26/16  Yes Brand Males, MD  torsemide (DEMADEX) 20 MG tablet TAKE 2 TABLETS BY MOUTH EVERY DAY Patient taking differently: Take 20 mg by mouth two times a day- morning and midday 12/22/17  Yes Bensimhon, Shaune Pascal, MD  dicyclomine (BENTYL) 20 MG tablet Take 1 tablet (20 mg total) by mouth 2 (two) times daily as needed for spasms (abdominal pain). Patient not taking: Reported on 12/25/2017 06/01/17   Gareth Morgan, MD  ELIQUIS 5 MG TABS tablet TAKE 1 TABLET TWICE A DAY Patient not taking: Reported on 12/25/2017 07/29/17   Bensimhon, Shaune Pascal, MD  Fluticasone-Salmeterol (ADVAIR) 250-50 MCG/DOSE AEPB Inhale 1 puff into the lungs every 12 (twelve) hours. Patient not taking: Reported on 12/25/2017 01/17/15   Clance, Armando Reichert, MD  nitroGLYCERIN (NITROSTAT) 0.4 MG SL tablet Place 1 tablet (0.4 mg total) under the tongue every 5 (five) minutes as needed for chest pain. Patient not taking: Reported on 12/25/2017 07/07/14 12/25/17  Deboraha Sprang, MD  allopurinol (ZYLOPRIM) 100 MG tablet Take 100 mg by mouth daily.    12/04/11  [provider]     Allergies:     No Known Allergies   Physical Exam:   Vitals  Blood pressure 117/67, pulse (!) 59, temperature 99.1 F (37.3 C), temperature source Oral, resp. rate (!) 29, height 5\' 10"  (1.778 m), weight 98.4 kg (217 lb), SpO2 96 %.   1. General  lying in bed in NAD,    2. Normal affect and insight, Not Suicidal or Homicidal, Awake Alert, Oriented X 3.  3. No F.N deficits, ALL C.Nerves Intact, Strength 5/5 all 4 extremities, Sensation intact all 4 extremities, Plantars down going.  4. Ears and Eyes appear Normal, Conjunctivae clear, PERRLA. Moist Oral Mucosa.  5. Supple Neck, + JVD, No cervical lymphadenopathy appriciated, No Carotid Bruits.  6. Symmetrical Chest wall movement, Good air movement bilaterally, slight crackle at bilateral base, no wheezing  7. RRR, s1 s2  8. Positive Bowel Sounds, Abdomen Soft, No tenderness, No organomegaly appriciated,No rebound -guarding or rigidity.  9.  No Cyanosis, Normal Skin Turgor, No Skin Rash or Bruise.  10. Good muscle tone,  joints appear normal , no effusions, Normal ROM.  11. No Palpable Lymph Nodes in Neck or Axillae     Data Review:    CBC Recent Labs  Lab 12/25/17 1737  WBC 8.1  HGB 11.2*  HCT 39.2  PLT 383  MCV 90.1  MCH 25.7*  MCHC 28.6*  RDW 27.2*   ------------------------------------------------------------------------------------------------------------------  Chemistries  Recent Labs  Lab 12/25/17 1737  NA 138  K 3.4*  CL 98*  CO2 28  GLUCOSE 106*  BUN 43*  CREATININE 1.67*  CALCIUM 8.0*   ------------------------------------------------------------------------------------------------------------------ estimated creatinine clearance is 35.9 mL/min (A) (by C-G formula based on SCr of 1.67 mg/dL (H)). ------------------------------------------------------------------------------------------------------------------ No results for input(s): TSH, T4TOTAL, T3FREE, THYROIDAB in the last 72 hours.  Invalid input(s):  FREET3  Coagulation profile No results for input(s): INR, PROTIME in the last 168 hours. ------------------------------------------------------------------------------------------------------------------- No results for input(s): DDIMER in the last 72 hours. -------------------------------------------------------------------------------------------------------------------  Cardiac Enzymes No results for input(s): CKMB, TROPONINI, MYOGLOBIN in the last 168 hours.  Invalid input(s): CK ------------------------------------------------------------------------------------------------------------------    Component Value Date/Time   BNP 2,089.7 (H) 12/25/2017 1737     ---------------------------------------------------------------------------------------------------------------  Urinalysis    Component Value Date/Time   COLORURINE YELLOW 06/01/2017 1100   APPEARANCEUR CLEAR 06/01/2017 1100   LABSPEC 1.015 06/01/2017 1100   PHURINE 5.0 06/01/2017 1100   GLUCOSEU NEGATIVE 06/01/2017 1100   GLUCOSEU NEGATIVE 12/08/2007 1621   HGBUR NEGATIVE 06/01/2017 1100   BILIRUBINUR NEGATIVE 06/01/2017 1100   KETONESUR NEGATIVE 06/01/2017 1100   PROTEINUR 100 (A) 06/01/2017 1100   UROBILINOGEN 1.0 03/28/2015 1819   NITRITE NEGATIVE 06/01/2017 1100   LEUKOCYTESUR TRACE (A) 06/01/2017 1100    ----------------------------------------------------------------------------------------------------------------   Imaging Results:    Dg Chest 2 View  Result Date: 12/25/2017 CLINICAL DATA:  Shortness of breath and lower extremity edema EXAM: CHEST - 2 VIEW COMPARISON:  January 07, 2016 FINDINGS: There is a small left pleural effusion with slight left base atelectasis. Lungs elsewhere are clear. There is cardiomegaly with pulmonary vascularity within normal limits. Pacemaker leads are attached to the right atrium and middle cardiac  vein. There are foci of coronary artery calcification in the left anterior  descending coronary artery. No adenopathy. There is degenerative change in the thoracic spine. There are surgical clips in the thyroid region. IMPRESSION: Small left pleural effusion with mild left base atelectasis. No edema or consolidation. Cardiomegaly. Calcification in the left anterior descending coronary artery. Pacemaker leads as described. No pneumothorax. Electronically Signed   By: Lowella Grip III M.D.   On: 12/25/2017 18:08   EKG nsr at 65, paced    Assessment & Plan:    Principal Problem:   Edema Active Problems:   Atrial fibrillation (HCC)   CKD (chronic kidney disease) stage 3, GFR 30-59 ml/min (HCC)   Hypokalemia   Anemia   Edema Lasix 40mg  iv qday  Hypokalemia Replete Check cmp in am  Pafib Cont Eliquis Cont Metoprolol  CAD, CHF (EF 55%) Cont Losartan Cont Metoprolol 50mg  po bid Cont Spironolactone 12.5mg  po qday Cont Ranexa  Hypothyroidism Cont levothyroxine  Copd Cont Advair Cont Spiriva 1puff qday  Dm2 fsbs ac and qhs, ISSS  Gerd Cont PPI    DVT Prophylaxis Eliquis SCDs  AM Labs Ordered, also please review Full Orders  Family Communication: Admission, patients condition and plan of care including tests being ordered have been discussed with the patient  who indicate understanding and agree with the plan and Code Status.  Code Status FULL CODE  Likely DC to  home  Condition GUARDED   Consults called:  none  Admission status: observation  Time spent in minutes : 45   Jani Gravel M.D on 12/25/2017 at 10:45 PM  Between 7am to 7pm - Pager - [phone]  . After 7pm go to [email or web address] - password [facility]  [facility] - Office  [phone]

## 2017-12-25 NOTE — Progress Notes (Signed)
ANTICOAGULATION CONSULT NOTE - Initial Consult  Pharmacy Consult for eliquis Indication: atrial fibrillation  No Known Allergies  Patient Measurements: Height: 5\' 10"  (177.8 cm) Weight: 217 lb (98.4 kg) IBW/kg (Calculated) : 68.5   Vital Signs: Temp: 99.1 F (37.3 C) (03/28 1709) Temp Source: Oral (03/28 1709) BP: 117/67 (03/28 2230) Pulse Rate: 59 (03/28 2230)  Labs: Recent Labs    12/25/17 1737  HGB 11.2*  HCT 39.2  PLT 383  CREATININE 1.67*    Estimated Creatinine Clearance: 35.9 mL/min (A) (by C-G formula based on SCr of 1.67 mg/dL (H)).   Medical History: Past Medical History:  Diagnosis Date  . Atrial fibrillation ([place])    Status post pulmonary vein isolation 2009 at [facility]  . Atrial fibrillation ([place]) 12/18/2007   Annotation: refractory Qualifier: Diagnosis of  By: Doy Mince LPN, Megan    . CAD (coronary artery disease)    LHC 05/2007: pLAD 70-80%, pRCA 40%, EF 20-25%. LAD lesion treated medically.   . CHF (congestive heart failure) ([place])   . Chronic diastolic heart failure ([place])    Echo 06/2010: Moderate LVH, EF 55-65%, normal wall motion, mild MR, moderate to severe LAE, mild RAE.   Marland Kitchen Chronic obstructive pulmonary disease ([place])   . COPD (chronic obstructive pulmonary disease) ([place])   . Coronary artery disease 06/15/2012  . Crohn's disease ([place])   . Depression   . Diabetes mellitus ([place]) 06/22/2012  . Diabetes mellitus without complication ([place])   . Dysrhythmia    atrial fib  . GERD (gastroesophageal reflux disease)   . Gout   . Hyperlipidemia   . Hypothyroidism    treated  . Morbid obesity ([place])   . Morbid obesity with BMI of 40.0-44.9, adult ([place]) 06/22/2012  . Nonischemic cardiomyopathy ([place])    history of,  EF 20-25% at left heart cath in 06/2007; echo 2011 had normal LV function  . Obstructive sleep apnea    continuous positive airway pressure not using at present  . On home oxygen therapy    "2L at night" (10/03/2015)  . Osteoporosis   .  Pacemaker    Medtronic  . Peripheral vascular disease (HCC) 15   rt arm clots  . Seasonal allergies   . Shortness of breath dyspnea    exersion  . Tachy-brady syndrome ([place])    status post implant of a medtronic dual-chamber pacemaker in 2001.  explanted in 2010 -- [place] pacemaker in 2010.    Medications:  See medication history  Assessment: 77 yo lady admitted with edema to continue eliquis for afib Goal of Therapy:  therapeutic anticoagulation Monitor platelets by anticoagulation protocol: Yes   Plan:  Eliquis 5 mg po bid Monitor for bleeding complications  Excell Seltzer Poteet 12/25/2017,11:09 PM

## 2017-12-25 NOTE — ED Notes (Signed)
[facility] at bedside 

## 2017-12-26 ENCOUNTER — Other Ambulatory Visit: Payer: Self-pay

## 2017-12-26 DIAGNOSIS — N183 Chronic kidney disease, stage 3 (moderate): Secondary | ICD-10-CM | POA: Diagnosis not present

## 2017-12-26 DIAGNOSIS — I5033 Acute on chronic diastolic (congestive) heart failure: Secondary | ICD-10-CM

## 2017-12-26 DIAGNOSIS — I272 Pulmonary hypertension, unspecified: Secondary | ICD-10-CM | POA: Diagnosis not present

## 2017-12-26 DIAGNOSIS — E876 Hypokalemia: Secondary | ICD-10-CM | POA: Diagnosis not present

## 2017-12-26 DIAGNOSIS — I50813 Acute on chronic right heart failure: Secondary | ICD-10-CM | POA: Diagnosis not present

## 2017-12-26 DIAGNOSIS — J449 Chronic obstructive pulmonary disease, unspecified: Secondary | ICD-10-CM | POA: Diagnosis not present

## 2017-12-26 DIAGNOSIS — R609 Edema, unspecified: Secondary | ICD-10-CM | POA: Diagnosis not present

## 2017-12-26 DIAGNOSIS — I4891 Unspecified atrial fibrillation: Secondary | ICD-10-CM | POA: Diagnosis not present

## 2017-12-26 LAB — GLUCOSE, CAPILLARY
Glucose-Capillary: 183 mg/dL — ABNORMAL HIGH (ref 65–99)
Glucose-Capillary: 129 mg/dL — ABNORMAL HIGH (ref 65–99)
Glucose-Capillary: 161 mg/dL — ABNORMAL HIGH (ref 65–99)

## 2017-12-26 MED ORDER — INSULIN ASPART 100 UNIT/ML ~~LOC~~ SOLN
0.0000 [IU] | Freq: Three times a day (TID) | SUBCUTANEOUS | Status: DC
  Administered 2017-12-26 – 2017-12-27 (×2): 1 [IU] via SUBCUTANEOUS
  Administered 2017-12-27 – 2017-12-28 (×3): 2 [IU] via SUBCUTANEOUS
  Administered 2017-12-28 – 2017-12-29 (×3): 1 [IU] via SUBCUTANEOUS
  Administered 2017-12-29 – 2017-12-30 (×2): 2 [IU] via SUBCUTANEOUS

## 2017-12-26 MED ORDER — ALBUTEROL SULFATE (2.5 MG/3ML) 0.083% IN NEBU: 2.5000 mg | INHALATION_SOLUTION | Freq: Four times a day (QID) | RESPIRATORY_TRACT | Status: DC | PRN

## 2017-12-26 MED ORDER — FUROSEMIDE 10 MG/ML IJ SOLN
40.0000 mg | Freq: Two times a day (BID) | INTRAMUSCULAR | Status: DC
  Administered 2017-12-26 – 2017-12-29 (×7): 40 mg via INTRAVENOUS
  Filled 2017-12-26 (×6): qty 4

## 2017-12-26 NOTE — ED Notes (Addendum)
Pt states she has not had the urge to urinate since approximately 11 pm last night; pt wearing purwick; no output noted this am

## 2017-12-26 NOTE — ED Notes (Signed)
Pt assisted with mouth care

## 2017-12-26 NOTE — ED Notes (Signed)
Breakfast tray at bedside 

## 2017-12-26 NOTE — ED Provider Notes (Signed)
[facility] EMERGENCY DEPARTMENT Provider Note   CSN: [id number] Arrival date & time: 12/25/17  1636     History   Chief Complaint Chief Complaint  Patient presents with  . Edema    HPI [name] is a 77 y.o. female.  .ros    Patient presents to the emergency department with shortness of breath and edema in her lower extremities.  The patient states that this is been worsening over the last week.  Patient states that nothing seemed to make her condition better.  She states that she takes torsemide for her CHF.  She normally does not wear oxygen on a regular basis only at night but has been requiring it during the day as well.  The patient denies chest pain, headache,blurred vision, neck pain, fever, cough, weakness, numbness, dizziness, anorexia,  abdominal pain, nausea, vomiting, diarrhea, rash, back pain, dysuria, hematemesis, bloody stool, near syncope, or syncope. Past Medical History:  Diagnosis Date  . Atrial fibrillation ([place])    Status post pulmonary vein isolation 2009 at [facility]  . Atrial fibrillation ([place]) 12/18/2007   Annotation: refractory Qualifier: Diagnosis of  By: Doy Mince LPN, Megan    . CAD (coronary artery disease)    LHC 05/2007: pLAD 70-80%, pRCA 40%, EF 20-25%. LAD lesion treated medically.   . CHF (congestive heart failure) ([place])   . Chronic diastolic heart failure ([place])    Echo 06/2010: Moderate LVH, EF 55-65%, normal wall motion, mild MR, moderate to severe LAE, mild RAE.   Marland Kitchen Chronic obstructive pulmonary disease ([place])   . COPD (chronic obstructive pulmonary disease) ([place])   . Coronary artery disease 06/15/2012  . Crohn's disease ([place])   . Depression   . Diabetes mellitus ([place]) 06/22/2012  . Diabetes mellitus without complication ([place])   . Dysrhythmia    atrial fib  . GERD (gastroesophageal reflux disease)   . Gout   . Hyperlipidemia   . Hypothyroidism    treated  . Morbid obesity ([place])   . Morbid obesity with BMI of  40.0-44.9, adult ([place]) 06/22/2012  . Nonischemic cardiomyopathy ([place])    history of,  EF 20-25% at left heart cath in 06/2007; echo 2011 had normal LV function  . Obstructive sleep apnea    continuous positive airway pressure not using at present  . On home oxygen therapy    "2L at night" (10/03/2015)  . Osteoporosis   . Pacemaker    Medtronic  . Peripheral vascular disease (HCC) 15   rt arm clots  . Seasonal allergies   . Shortness of breath dyspnea    exersion  . Tachy-brady syndrome ([place])    status post implant of a medtronic dual-chamber pacemaker in 2001.  explanted in 2010 -- [place] pacemaker in 2010.    Patient Active Problem List   Diagnosis Date Noted  . Edema 12/25/2017  . Anemia 12/25/2017  . Hypokalemia   . Chronic atrial fibrillation ([place])   . Pulmonary hypertension ([place]), RHC on 01/09/2016 01/09/2016  . RVF (right ventricular failure) ([place])   . UTI (lower urinary tract infection) 10/27/2015  . AKI (acute kidney injury) ([place]) 10/16/2015  . Hypomagnesemia 10/13/2015  . Malnutrition of moderate degree 10/11/2015  . Hypocalcemia 10/10/2015  . Near syncope 03/28/2015  . Uncomplicated asthma 58/52/7782  . CKD (chronic kidney disease) stage 3, GFR 30-59 ml/min (HCC) 03/13/2015  . Chronic obstructive pulmonary disease ([place]) 03/13/2015  . Crohn's disease of large intestine without complication ([place]) 42/35/3614  .  Gastro-esophageal reflux disease without esophagitis 03/13/2015  . Personal history of infectious and parasitic disease 03/13/2015  . Gout 03/13/2015  . Hypertensive heart disease with CHF (congestive heart failure) ([place]) 03/13/2015  . Hypothyroidism 03/13/2015  . Low back pain 03/13/2015  . Extreme obesity 03/13/2015  . Paroxysmal atrial fibrillation ([place]) 03/13/2015  . Osteopenia 03/13/2015  . Primary hyperparathyroidism ([place]) 03/13/2015  . Secondary polycythemia 03/13/2015  . Sleep apnea 03/13/2015  . Type 2 diabetes mellitus with  diabetic nephropathy ([place]) 03/13/2015  . OSA (obstructive sleep apnea) 02/18/2015  . Pulmonary hypertension due to sleep-disordered breathing ([place]) 02/18/2015  . Hyperparathyroidism, primary ([place]) 01/30/2015  . Obesity 11/10/2014  . Ischemia of hand 08/15/2014  . Morbid obesity with BMI of 40.0-44.9, adult ([place]) 06/22/2012  . Coronary artery disease 06/15/2012  . Long term current use of anticoagulant therapy 01/11/2011  . CARDIAC PACEMAKER IN SITU-MEDTRONIC ADAPTA L 01/15/2010  . DIASTOLIC HEART FAILURE, CHRONIC 12/22/2008  . DYSLIPIDEMIA 09/05/2008  . OBESITY-MORBID (>100') 09/05/2008  . OTHER OSTEOPOROSIS 09/05/2008  . CROHN'S DISEASE, HX OF 09/05/2008  . COPD with emphysema ([place]) 01/02/2008  . HYPERSOMNIA 12/21/2007  . ANXIETY 12/18/2007  . DEPRESSION 12/18/2007  . Atrial fibrillation ([place]) 12/18/2007    Past Surgical History:  Procedure Laterality Date  . CARDIAC CATHETERIZATION  08/2000   noncritical disease mid RCA, EF preserved  . CARDIAC CATHETERIZATION  05/29/2007   noncritical disease, EF 20-25%  . CARDIAC CATHETERIZATION N/A 01/09/2016   Procedure: Right Heart Cath;  Surgeon: Jolaine Artist, MD;  Location: [facility];  Service: Cardiovascular;  Laterality: N/A;  . CARDIAC CATHETERIZATION N/A 07/15/2016   Procedure: Right Heart Cath;  Surgeon: Jolaine Artist, MD;  Location: [facility];  Service: Cardiovascular;  Laterality: N/A;  . CATARACT EXTRACTION W/ INTRAOCULAR LENS  IMPLANT, BILATERAL Bilateral 2014-2016  . EMBOLECTOMY Right 08/16/2014   Procedure: EMBOLECTOMY- RIGHT BRACHIAL ARTERY;  Surgeon: Serafina Mitchell, MD;  Location: [facility];  Service: Vascular;  Laterality: Right;  . PACEMAKER INSERTION  2010   medtronic ADAPTA   . PARATHYROIDECTOMY Right 01/30/2015   Procedure: PARATHYROIDECTOMY;  Surgeon: Armandina Gemma, MD;  Location: [facility];  Service: General;  Laterality: Right;  . PARATHYROIDECTOMY Left 10/03/2015   Left inferior parathyroidectomy  w/neck exploration  . PARATHYROIDECTOMY Left 10/03/2015   Procedure: LEFT PARATHYROID EXPLORATION AND  PARATHYROIDECTOMY;  Surgeon: Armandina Gemma, MD;  Location: [facility];  Service: General;  Laterality: Left;  . post ablation pseudoaneurysm     at A fib ablation  . pulmonary vein isolation  05/12/2008   RFCA atrial fibrillation  . TONSILLECTOMY       OB History    Gravida  0   Para  0   Term  0   Preterm  0   AB  0   Living        SAB  0   TAB  0   Ectopic  0   Multiple      Live Births               Home Medications    Prior to Admission medications   Medication Sig Start Date End Date Taking? Authorizing Provider  ADVAIR DISKUS 500-50 MCG/DOSE AEPB Inhale 1 puff into the lungs every 12 (twelve) hours. 05/15/16  Yes [provider]  albuterol (PROVENTIL HFA;VENTOLIN HFA) 108 (90 BASE) MCG/ACT inhaler Inhale 2 puffs into the lungs every 6 (six) hours as needed for wheezing or shortness of breath.  Yes [provider]  apixaban (ELIQUIS) 5 MG TABS tablet Take 1 tablet (5 mg total) by mouth 2 (two) times daily. 07/29/17  Yes Bensimhon, Shaune Pascal, MD  atorvastatin (LIPITOR) 80 MG tablet Take 80 mg by mouth at bedtime.     Yes [provider]  colchicine 0.6 MG tablet Take 0.6-1.2 mg by mouth daily as needed (for gout flares). Reported on 03/04/2016 08/09/15  Yes [provider]  diphenhydramine-acetaminophen (TYLENOL PM) 25-500 MG TABS tablet Take 1 tablet by mouth at bedtime as needed (sleep/pain). 01/12/16  Yes Florencia Reasons, MD  folic acid (FOLVITE) 1 MG tablet Take 1 mg by mouth daily. 06/01/15  Yes [provider]  KLOR-CON M20 20 MEQ tablet TAKE 1 TABLET (20 MEQ TOTAL) BY MOUTH 2 (TWO) TIMES DAILY. Patient taking differently: Take 20 mEq by mouth once a day 10/23/17  Yes Larey Dresser, MD  LANTUS 100 UNIT/ML injection Inject 10 Units into the skin daily before breakfast.  12/12/17  Yes [provider]  levothyroxine  (SYNTHROID, LEVOTHROID) 100 MCG tablet Take 100 mcg by mouth daily. 05/02/16  Yes [provider]  losartan (COZAAR) 50 MG tablet TAKE 1 TABLET (50 MG TOTAL) BY MOUTH DAILY. 12/06/16  Yes Larey Dresser, MD  Macitentan (OPSUMIT) 10 MG TABS Take 10 mg by mouth daily.   Yes [provider]  metoprolol tartrate (LOPRESSOR) 50 MG tablet Take 1 tablet (50 mg total) by mouth 2 (two) times daily. 08/26/17  Yes Larey Dresser, MD  Multiple Vitamins-Minerals (MULTIVITAMIN WITH MINERALS) tablet Take 1 tablet by mouth daily.   Yes [provider]  Multiple Vitamins-Minerals (PRESERVISION AREDS 2 PO) Take 1 tablet by mouth 2 (two) times daily.    Yes [provider]  nystatin cream (MYCOSTATIN) Apply 1 application topically 2 (two) times daily as needed (for skin irritation).  04/01/16  Yes [provider]  omeprazole (PRILOSEC) 20 MG capsule Take 20 mg by mouth daily.   Yes [provider]  ranolazine (RANEXA) 500 MG 12 hr tablet Take 1 tablet by mouth twice daily. Patient taking differently: Take 500 mg by mouth 2 (two) times daily.  11/06/17  Yes Bensimhon, Shaune Pascal, MD  sildenafil (REVATIO) 20 MG tablet Take 2 tablets (40 mg total) by mouth 3 (three) times daily. 06/13/16  Yes Bensimhon, Shaune Pascal, MD  spironolactone (ALDACTONE) 25 MG tablet Take 0.5 tablets (12.5 mg total) by mouth daily. 11/21/17  Yes Bensimhon, Shaune Pascal, MD  sulfaSALAzine (AZULFIDINE) 500 MG tablet Take 1,000 mg by mouth 2 (two) times daily. 04/12/16  Yes [provider]  Tiotropium Bromide Monohydrate (SPIRIVA RESPIMAT) 2.5 MCG/ACT AERS INHALE 2 PUFFS INTO THE LUNGS DAILY. 07/26/16  Yes Brand Males, MD  torsemide (DEMADEX) 20 MG tablet TAKE 2 TABLETS BY MOUTH EVERY DAY Patient taking differently: Take 20 mg by mouth two times a day- morning and midday 12/22/17  Yes Bensimhon, Shaune Pascal, MD  dicyclomine (BENTYL) 20 MG tablet Take 1 tablet (20 mg total) by mouth 2 (two) times daily  as needed for spasms (abdominal pain). Patient not taking: Reported on 12/25/2017 06/01/17   Gareth Morgan, MD  ELIQUIS 5 MG TABS tablet TAKE 1 TABLET TWICE A DAY Patient not taking: Reported on 12/25/2017 07/29/17   Bensimhon, Shaune Pascal, MD  Fluticasone-Salmeterol (ADVAIR) 250-50 MCG/DOSE AEPB Inhale 1 puff into the lungs every 12 (twelve) hours. Patient not taking: Reported on 12/25/2017 01/17/15   Clance, Armando Reichert, MD  nitroGLYCERIN (NITROSTAT)  0.4 MG SL tablet Place 1 tablet (0.4 mg total) under the tongue every 5 (five) minutes as needed for chest pain. Patient not taking: Reported on 12/25/2017 07/07/14 12/25/17  Deboraha Sprang, MD  allopurinol (ZYLOPRIM) 100 MG tablet Take 100 mg by mouth daily.    12/04/11  [provider]    Family History Family History  Problem Relation Age of Onset  . Breast cancer Mother   . Heart disease Father   . Emphysema Father     Social History Social History   Tobacco Use  . Smoking status: Former Smoker    Packs/day: 3.00    Years: 32.00    Pack years: 96.00    Last attempt to quit: 09/30/1986    Years since quitting: 31.2  . Smokeless tobacco: Former Systems developer    Quit date: 08/15/1987  Substance Use Topics  . Alcohol use: No    Alcohol/week: 0.0 oz  . Drug use: No     Allergies   Patient has no known allergies.   Review of Systems Review of Systems All other systems negative except as documented in the HPI. All pertinent positives and negatives as reviewed in the HPI.  Physical Exam Updated Vital Signs BP 109/62 (BP Location: Right Arm)   Pulse 63   Temp 98.5 F (36.9 C)   Resp 17   Ht 5\' 10"  (1.778 m)   Wt 98.4 kg (217 lb)   SpO2 100%   BMI 31.14 kg/m   Physical Exam  Constitutional: She is oriented to person, place, and time. She appears well-developed and well-nourished. No distress.  HENT:  Head: Normocephalic and atraumatic.  Mouth/Throat: Oropharynx is clear and moist.  Eyes: Pupils are equal, round, and reactive  to light.  Neck: Normal range of motion. Neck supple.  Cardiovascular: Normal rate, regular rhythm and normal heart sounds. Exam reveals no gallop and no friction rub.  No murmur heard. Pulmonary/Chest: Effort normal and breath sounds normal. No respiratory distress. She has no wheezes.  Abdominal: Soft. Bowel sounds are normal. She exhibits no distension. There is no tenderness.  Musculoskeletal: She exhibits edema.  Neurological: She is alert and oriented to person, place, and time. She exhibits normal muscle tone. Coordination normal.  Skin: Skin is warm and dry. Capillary refill takes less than 2 seconds. No rash noted. No erythema.  Psychiatric: She has a normal mood and affect. Her behavior is normal.  Nursing note and vitals reviewed.    ED Treatments / Results  Labs (all labs ordered are listed, but only abnormal results are displayed) Labs Reviewed  BASIC METABOLIC PANEL - Abnormal; Notable for the following components:      Result Value   Potassium 3.4 (*)    Chloride 98 (*)    Glucose, Bld 106 (*)    BUN 43 (*)    Creatinine, Ser 1.67 (*)    Calcium 8.0 (*)    GFR calc non Af Amer 28 (*)    GFR calc Af Amer 33 (*)    All other components within normal limits  CBC - Abnormal; Notable for the following components:   Hemoglobin 11.2 (*)    MCH 25.7 (*)    MCHC 28.6 (*)    RDW 27.2 (*)    All other components within normal limits  BRAIN NATRIURETIC PEPTIDE - Abnormal; Notable for the following components:   B Natriuretic Peptide 2,089.7 (*)    All other components within normal limits  I-STAT TROPONIN, ED  EKG None  Radiology Dg Chest 2 View  Result Date: 12/25/2017 CLINICAL DATA:  Shortness of breath and lower extremity edema EXAM: CHEST - 2 VIEW COMPARISON:  January 07, 2016 FINDINGS: There is a small left pleural effusion with slight left base atelectasis. Lungs elsewhere are clear. There is cardiomegaly with pulmonary vascularity within normal limits.  Pacemaker leads are attached to the right atrium and middle cardiac vein. There are foci of coronary artery calcification in the left anterior descending coronary artery. No adenopathy. There is degenerative change in the thoracic spine. There are surgical clips in the thyroid region. IMPRESSION: Small left pleural effusion with mild left base atelectasis. No edema or consolidation. Cardiomegaly. Calcification in the left anterior descending coronary artery. Pacemaker leads as described. No pneumothorax. Electronically Signed   By: Lowella Grip III M.D.   On: 12/25/2017 18:08    Procedures Procedures (including critical care time)  Medications Ordered in ED Medications  fluticasone furoate-vilanterol (BREO ELLIPTA) 200-25 MCG/INH 1 puff (has no administration in time range)  albuterol (PROVENTIL HFA;VENTOLIN HFA) 108 (90 Base) MCG/ACT inhaler 2 puff (has no administration in time range)  atorvastatin (LIPITOR) tablet 80 mg (80 mg Oral Given 12/26/17 0020)  colchicine tablet 0.6-1.2 mg (has no administration in time range)  folic acid (FOLVITE) tablet 1 mg (has no administration in time range)  potassium chloride SA (K-DUR,KLOR-CON) CR tablet 20 mEq (has no administration in time range)  levothyroxine (SYNTHROID, LEVOTHROID) tablet 100 mcg (has no administration in time range)  losartan (COZAAR) tablet 50 mg (has no administration in time range)  macitentan (OPSUMIT) tablet 10 mg (has no administration in time range)  metoprolol tartrate (LOPRESSOR) tablet 50 mg (50 mg Oral Given 12/26/17 0026)  multivitamin with minerals tablet 1 tablet (has no administration in time range)  multivitamin (PROSIGHT) tablet 1 tablet (has no administration in time range)  pantoprazole (PROTONIX) EC tablet 40 mg (has no administration in time range)  ranolazine (RANEXA) 12 hr tablet 500 mg (500 mg Oral Given 12/26/17 0019)  sildenafil (REVATIO) tablet 40 mg (has no administration in time range)  spironolactone  (ALDACTONE) tablet 12.5 mg (has no administration in time range)  sulfaSALAzine (AZULFIDINE) tablet 1,000 mg (has no administration in time range)  furosemide (LASIX) injection 40 mg (has no administration in time range)  tiotropium (SPIRIVA) inhalation capsule 18 mcg (has no administration in time range)  apixaban (ELIQUIS) tablet 5 mg (5 mg Oral Given 12/26/17 0019)  furosemide (LASIX) injection 40 mg (40 mg Intravenous Given 12/25/17 2229)  potassium chloride SA (K-DUR,KLOR-CON) CR tablet 20 mEq (20 mEq Oral Given 12/26/17 0019)     Initial Impression / Assessment and Plan / ED Course  I have reviewed the triage vital signs and the nursing notes.  Pertinent labs & imaging results that were available during my care of the patient were reviewed by me and considered in my medical decision making (see chart for details).     Patient will be admitted for CHF. Spoke with [facility] and will admit the patient.   Final Clinical Impressions(s) / ED Diagnoses   Final diagnoses:  Acute on chronic right-sided congestive heart failure [facility])    ED Discharge Orders    None       Dalia Heading, PA-C 12/27/17 5400    Gareth Morgan, MD 12/29/17 [phone]

## 2017-12-26 NOTE — ED Notes (Signed)
Admitting MD paged regarding administration of Metoprolol ( HR 60, Pulse 58)

## 2017-12-26 NOTE — ED Notes (Signed)
Report Given to RN 

## 2017-12-26 NOTE — ED Notes (Signed)
Bladder Scan 173 mL

## 2017-12-26 NOTE — Progress Notes (Signed)
PROGRESS NOTE    [name]  QBH:[id number] DOB: 09/02/1941 DOA: 12/25/2017 PCP: [name], MD    Brief Narrative:  77 year old female who presented with lower segment edema. She does have the significant past medical history for type 2 diabetes mellitus, hypertension, dyslipidemia, hypothyroidism, paroxysmal atrial ventilation and diastolic heart failure. She was noted to have significant, worsening lower extremity edema, she was evaluated by the gastroenterology clinic for further evaluation. On initial physical examination blood pressure 117/67, heart rate 59, temperature 99.1, oxygen saturation 96%, respiratory rate 29. Moist mucous membranes, lungs with rales at bases bilaterally, no wheezing, positive JVD, heart S1-S2 present and rhythmic, no gallops, rubs or murmurs, abdomen soft nontender, positive lower extremity edema. Sodium 138, potassium 3.4, chloride 98, bicarbonate 28, glucose 106, BUN 43, creatinine 1.67, BNP 2089, white count 8.1, hemoglobin 11.2, hematocrit 39.2, platelets 383. Chest x-ray, cardiomegaly, small left pleural effusion, increase hilar vascular congestion. EKG with 66 bpm, 100% V paced rhythm.   Patient was admitted to hospital working diagnosis of volume overload due to acute on chronic diastolic heart failure exacerbation.  Assessment & Plan:   Principal Problem:   Edema Active Problems:   Atrial fibrillation (HCC)   CKD (chronic kidney disease) stage 3, GFR 30-59 ml/min (HCC)   Hypokalemia   Anemia   1. Acute on chronic diastolic heart failure exacerbation. Will continue diuresis with IV furosemide to target negative fluid balance, follow on strict in and out and daily weight. Will continue heart failure regimen with losartan, metoprolol and spironolactone.   2. Paroxysmal atrial fibrillation. Continue rate control with metoprolol  3. Hypertension. Continue blood pressure monitoring, continue losartan.   4. Hypothyroidism. Continue  levothyroxine  5. COPD. No signs of acute exacerbation, will continue bronchodilator therapy. Continue breo and tiotropium.   6. Type 2 diabetes mellitus. Continue insulin sliding scale for glucose cover and monitoring.   7. CKD stage 3 with calculated GFR 33 with a baseline cr at 1,5. Will continue diuresis with furosemide, K at 3,4 will follow renal panel in am. Avoid hypotension.    DVT prophylaxis: apixaban  Code Status:  full Family Communication: no family at the bedside  Disposition Plan: home   Consultants:     Procedures:     Antimicrobials:       Subjective: Patient is feeling better, persistent lower extremity edema, no chest pain, no nausea or vomiting.   Objective: Vitals:   12/26/17 1049 12/26/17 1100 12/26/17 1200 12/26/17 1237  BP: (!) 108/95 (!) 101/58 104/79 (!) 109/59  Pulse: 62 (!) 58 61 60  Resp: (!) 26 (!) 25 (!) 29 (!) 26  Temp:      TempSrc:      SpO2: 99% 100% 97% 95%  Weight:      Height:        Intake/Output Summary (Last 24 hours) at 12/26/2017 1250 Last data filed at 12/26/2017 1215 Gross per 24 hour  Intake -  Output 525 ml  Net -525 ml   Filed Weights   12/25/17 1710  Weight: 98.4 kg (217 lb)    Examination:   General: Not in pain or dyspnea, deconditioned Neurology: Awake and alert, non focal  E ENT: positive pallor, no icterus, oral mucosa moist Cardiovascular: No JVD. S1-S2 present, rhythmic, no gallops, rubs, or murmurs. Positive +++ bilateral non pitting lower extremity edema. Pulmonary: decreased breath sounds bilaterally at bases, adequate air movement, no wheezing, rhonchi or rales. Gastrointestinal. Abdomen protuberant,  no organomegaly, non tender, no  rebound or guarding Skin. No rashes Musculoskeletal: no joint deformities     Data Reviewed: I have personally reviewed following labs and imaging studies  CBC: Recent Labs  Lab 12/25/17 1737  WBC 8.1  HGB 11.2*  HCT 39.2  MCV 90.1  PLT 564   Basic  Metabolic Panel: Recent Labs  Lab 12/25/17 1737  NA 138  K 3.4*  CL 98*  CO2 28  GLUCOSE 106*  BUN 43*  CREATININE 1.67*  CALCIUM 8.0*   GFR: Estimated Creatinine Clearance: 35.9 mL/min (A) (by C-G formula based on SCr of 1.67 mg/dL (H)). Liver Function Tests: No results for input(s): AST, ALT, ALKPHOS, BILITOT, PROT, ALBUMIN in the last 168 hours. No results for input(s): LIPASE, AMYLASE in the last 168 hours. No results for input(s): AMMONIA in the last 168 hours. Coagulation Profile: No results for input(s): INR, PROTIME in the last 168 hours. Cardiac Enzymes: No results for input(s): CKTOTAL, CKMB, CKMBINDEX, TROPONINI in the last 168 hours. BNP (last 3 results) No results for input(s): PROBNP in the last 8760 hours. HbA1C: No results for input(s): HGBA1C in the last 72 hours. CBG: No results for input(s): GLUCAP in the last 168 hours. Lipid Profile: No results for input(s): CHOL, HDL, LDLCALC, TRIG, CHOLHDL, LDLDIRECT in the last 72 hours. Thyroid Function Tests: No results for input(s): TSH, T4TOTAL, FREET4, T3FREE, THYROIDAB in the last 72 hours. Anemia Panel: No results for input(s): VITAMINB12, FOLATE, FERRITIN, TIBC, IRON, RETICCTPCT in the last 72 hours.    Radiology Studies: I have reviewed all of the imaging during this hospital visit personally     Scheduled Meds: . apixaban  5 mg Oral BID  . atorvastatin  80 mg Oral QHS  . fluticasone furoate-vilanterol  1 puff Inhalation Daily  . folic acid  1 mg Oral Daily  . furosemide  40 mg Intravenous Daily  . levothyroxine  100 mcg Oral QAC breakfast  . losartan  50 mg Oral Daily  . macitentan  10 mg Oral Daily  . metoprolol tartrate  50 mg Oral BID  . multivitamin  1 tablet Oral Daily  . multivitamin with minerals  1 tablet Oral Daily  . pantoprazole  40 mg Oral Daily  . potassium chloride SA  20 mEq Oral Daily  . ranolazine  500 mg Oral BID  . sildenafil  40 mg Oral TID  . spironolactone  12.5 mg  Oral Daily  . sulfaSALAzine  1,000 mg Oral BID WC  . tiotropium  18 mcg Inhalation Daily   Continuous Infusions:   LOS: 0 days        Elzy Tomasello Gerome Apley, MD [facility] Pager [phone]

## 2017-12-26 NOTE — ED Notes (Addendum)
Verified holding metoprolol with Dr. Cathlean Sauer

## 2017-12-27 DIAGNOSIS — J449 Chronic obstructive pulmonary disease, unspecified: Secondary | ICD-10-CM

## 2017-12-27 DIAGNOSIS — I50813 Acute on chronic right heart failure: Secondary | ICD-10-CM | POA: Diagnosis not present

## 2017-12-27 DIAGNOSIS — I272 Pulmonary hypertension, unspecified: Secondary | ICD-10-CM

## 2017-12-27 DIAGNOSIS — I4891 Unspecified atrial fibrillation: Secondary | ICD-10-CM | POA: Diagnosis not present

## 2017-12-27 DIAGNOSIS — E876 Hypokalemia: Secondary | ICD-10-CM | POA: Diagnosis not present

## 2017-12-27 DIAGNOSIS — N183 Chronic kidney disease, stage 3 (moderate): Secondary | ICD-10-CM | POA: Diagnosis not present

## 2017-12-27 LAB — BASIC METABOLIC PANEL
Anion gap: 11 (ref 5–15)
BUN: 41 mg/dL — ABNORMAL HIGH (ref 6–20)
CALCIUM: 7.8 mg/dL — AB (ref 8.9–10.3)
CO2: 28 mmol/L (ref 22–32)
CREATININE: 1.63 mg/dL — AB (ref 0.44–1.00)
Chloride: 98 mmol/L — ABNORMAL LOW (ref 101–111)
GFR calc Af Amer: 34 mL/min — ABNORMAL LOW (ref >60)
GFR calc non Af Amer: 29 mL/min — ABNORMAL LOW (ref >60)
GLUCOSE: 134 mg/dL — AB (ref 65–99)
Potassium: 3.6 mmol/L (ref 3.5–5.1)
Sodium: 137 mmol/L (ref 135–145)

## 2017-12-27 LAB — GLUCOSE, CAPILLARY
GLUCOSE-CAPILLARY: 153 mg/dL — AB (ref 65–99)
GLUCOSE-CAPILLARY: 142 mg/dL — AB (ref 65–99)
Glucose-Capillary: 126 mg/dL — ABNORMAL HIGH (ref 65–99)
Glucose-Capillary: 152 mg/dL — ABNORMAL HIGH (ref 65–99)

## 2017-12-27 NOTE — Progress Notes (Signed)
PT Cancellation Note  Patient Details Name: [name] MRN: [id number] DOB: July 10, 1941   Cancelled Treatment:    Reason Eval/Treat Not Completed: Patient declined, no reason specified. Pt refusing to participate in PT evaluation at this time, stating that her feet are too swollen and painful. PT will continue to f/u with pt as available.   Clearnce Sorrel Yandel Zeiner 12/27/2017, 4:27 PM

## 2017-12-27 NOTE — Care Management Obs Status (Signed)
[facility] NOTIFICATION   Patient Details  Name: [name] MRN: [id number] Date of Birth: March 08, 1941   Medicare Observation Status Notification Given:  Yes    Bethena Roys, RN 12/27/2017, 4:17 PM

## 2017-12-27 NOTE — Progress Notes (Signed)
PROGRESS NOTE    [name]  VEL:[id number] DOB: May 18, 1941 DOA: 12/25/2017 PCP: [name], MD    Brief Narrative:  77 year old female who presented with lower segment edema. She does have the significant past medical history for type 2 diabetes mellitus, hypertension, dyslipidemia, hypothyroidism, paroxysmal atrial ventilation and diastolic heart failure. She was noted to have significant, worsening lower extremity edema, she was evaluated by the gastroenterology clinic for further evaluation. On initial physical examination blood pressure 117/67, heart rate 59, temperature 99.1, oxygen saturation 96%, respiratory rate 29. Moist mucous membranes, lungs with rales at bases bilaterally, no wheezing, positive JVD, heart S1-S2 present and rhythmic, no gallops, rubs or murmurs, abdomen soft nontender, positive lower extremity edema. Sodium 138, potassium 3.4, chloride 98, bicarbonate 28, glucose 106, BUN 43, creatinine 1.67, BNP 2089, white count 8.1, hemoglobin 11.2, hematocrit 39.2, platelets 383. Chest x-ray, cardiomegaly, small left pleural effusion, increase hilar vascular congestion. EKG with 66 bpm, 100% V paced rhythm.   Patient was admitted to hospital working diagnosis of volume overload due to acute on chronic diastolic heart failure exacerbation  Assessment & Plan:   Principal Problem:   Edema Active Problems:   Atrial fibrillation (HCC)   CKD (chronic kidney disease) stage 3, GFR 30-59 ml/min (HCC)   Hypokalemia   Anemia   1. Acute on chronic diastolic heart failure exacerbation. Tolerating well diuresis with IV furosemide, negative fluid balance with urine output 1,726 over last 24 hours , continue strict in and out plus daily weight. Heart failure management with  metoprolol and spironolactone. Will hold on losartan due to hypotension.   2. Paroxysmal atrial fibrillation. Metoprolol for rate control, anticoagulation with apixaban.   3. Hypertension. Continue  metoprolol, systolic blood pressure  90 to 113, will hold on losartan for now.   4. Hypothyroidism. On levothyroxine  5. COPD with pulmonary hypertension. Continue bronchodilator with breo and tiotropium. No signs of exacerbation. Continue sildenafil.  6. Type 2 diabetes mellitus. On insulin sliding scale for glucose cover and monitoring. Capillary glucose 183, 129, 161, 126, 153. Patient tolerating po well.   7. CKD stage 3 with calculated GFR 33 with a baseline cr at 1,5.  Stable renal function with serum cr at 1,63, with K at 3,6 and serum bicarbonate at 28. Will continue diuresis with furosemide and aldactone.   8. Gout. No active flare, will continue with colchicine.   DVT prophylaxis: apixaban  Code Status:  full Family Communication: no family at the bedside  Disposition Plan: home   Consultants:     Procedures:     Antimicrobials:       Subjective: Patient is feeling better, improved edema, but still not back to her baseline, no chest pain, no nausea or vomiting.   Objective: Vitals:   12/26/17 2046 12/27/17 0315 12/27/17 0839 12/27/17 1026  BP: (!) 113/59 (!) 90/42  (!) 90/52  Pulse: 66 83  85  Resp: 20 20    Temp: 98 F (36.7 C) 98.5 F (36.9 C)    TempSrc: Oral Oral    SpO2: 100% 97% (!) 7%   Weight:  92 kg (202 lb 14.4 oz)    Height:        Intake/Output Summary (Last 24 hours) at 12/27/2017 1037 Last data filed at 12/27/2017 0600 Gross per 24 hour  Intake 480 ml  Output 1726 ml  Net -1246 ml   Filed Weights   12/25/17 1710 12/26/17 1300 12/27/17 0315  Weight: 98.4 kg (217 lb) 95.2  kg (209 lb 14.1 oz) 92 kg (202 lb 14.4 oz)    Examination:   General: Not in pain or dyspnea, deconditioned Neurology: Awake and alert, non focal  E ENT: mid pallor, no icterus, oral mucosa moist Cardiovascular: No JVD. S1-S2 present, rhythmic, no gallops, rubs, or murmurs. +++ non pitting lower extremity edema. Pulmonary: decreased breath sounds  bilaterally at bases, no wheezing, rhonchi. Positive bilateral rales. Gastrointestinal. Abdomen protuberant, no organomegaly, non tender, no rebound or guarding Skin. No rashes Musculoskeletal: no joint deformities     Data Reviewed: I have personally reviewed following labs and imaging studies  CBC: Recent Labs  Lab 12/25/17 1737  WBC 8.1  HGB 11.2*  HCT 39.2  MCV 90.1  PLT 474   Basic Metabolic Panel: Recent Labs  Lab 12/25/17 1737 12/27/17 0249  NA 138 137  K 3.4* 3.6  CL 98* 98*  CO2 28 28  GLUCOSE 106* 134*  BUN 43* 41*  CREATININE 1.67* 1.63*  CALCIUM 8.0* 7.8*   GFR: Estimated Creatinine Clearance: 35.5 mL/min (A) (by C-G formula based on SCr of 1.63 mg/dL (H)). Liver Function Tests: No results for input(s): AST, ALT, ALKPHOS, BILITOT, PROT, ALBUMIN in the last 168 hours. No results for input(s): LIPASE, AMYLASE in the last 168 hours. No results for input(s): AMMONIA in the last 168 hours. Coagulation Profile: No results for input(s): INR, PROTIME in the last 168 hours. Cardiac Enzymes: No results for input(s): CKTOTAL, CKMB, CKMBINDEX, TROPONINI in the last 168 hours. BNP (last 3 results) No results for input(s): PROBNP in the last 8760 hours. HbA1C: No results for input(s): HGBA1C in the last 72 hours. CBG: Recent Labs  Lab 12/26/17 1348 12/26/17 1705 12/26/17 2044 12/27/17 0718  GLUCAP 183* 129* 161* 126*   Lipid Profile: No results for input(s): CHOL, HDL, LDLCALC, TRIG, CHOLHDL, LDLDIRECT in the last 72 hours. Thyroid Function Tests: No results for input(s): TSH, T4TOTAL, FREET4, T3FREE, THYROIDAB in the last 72 hours. Anemia Panel: No results for input(s): VITAMINB12, FOLATE, FERRITIN, TIBC, IRON, RETICCTPCT in the last 72 hours.    Radiology Studies: I have reviewed all of the imaging during this hospital visit personally     Scheduled Meds: . apixaban  5 mg Oral BID  . atorvastatin  80 mg Oral QHS  . fluticasone  furoate-vilanterol  1 puff Inhalation Daily  . folic acid  1 mg Oral Daily  . furosemide  40 mg Intravenous Q12H  . insulin aspart  0-9 Units Subcutaneous TID WC  . levothyroxine  100 mcg Oral QAC breakfast  . losartan  50 mg Oral Daily  . macitentan  10 mg Oral Daily  . metoprolol tartrate  50 mg Oral BID  . multivitamin  1 tablet Oral Daily  . multivitamin with minerals  1 tablet Oral Daily  . pantoprazole  40 mg Oral Daily  . ranolazine  500 mg Oral BID  . sildenafil  40 mg Oral TID  . spironolactone  12.5 mg Oral Daily  . sulfaSALAzine  1,000 mg Oral BID WC  . tiotropium  18 mcg Inhalation Daily   Continuous Infusions:   LOS: 0 days        Mauricio Gerome Apley, MD [facility] Pager [phone]

## 2017-12-28 DIAGNOSIS — I959 Hypotension, unspecified: Secondary | ICD-10-CM | POA: Diagnosis not present

## 2017-12-28 DIAGNOSIS — I50813 Acute on chronic right heart failure: Secondary | ICD-10-CM | POA: Diagnosis present

## 2017-12-28 DIAGNOSIS — I5033 Acute on chronic diastolic (congestive) heart failure: Secondary | ICD-10-CM | POA: Diagnosis not present

## 2017-12-28 DIAGNOSIS — D529 Folate deficiency anemia, unspecified: Secondary | ICD-10-CM | POA: Diagnosis not present

## 2017-12-28 DIAGNOSIS — E1122 Type 2 diabetes mellitus with diabetic chronic kidney disease: Secondary | ICD-10-CM | POA: Diagnosis present

## 2017-12-28 DIAGNOSIS — I2729 Other secondary pulmonary hypertension: Secondary | ICD-10-CM | POA: Diagnosis present

## 2017-12-28 DIAGNOSIS — I130 Hypertensive heart and chronic kidney disease with heart failure and stage 1 through stage 4 chronic kidney disease, or unspecified chronic kidney disease: Secondary | ICD-10-CM | POA: Diagnosis present

## 2017-12-28 DIAGNOSIS — R509 Fever, unspecified: Secondary | ICD-10-CM | POA: Diagnosis not present

## 2017-12-28 DIAGNOSIS — I2721 Secondary pulmonary arterial hypertension: Secondary | ICD-10-CM | POA: Diagnosis not present

## 2017-12-28 DIAGNOSIS — K921 Melena: Secondary | ICD-10-CM | POA: Diagnosis not present

## 2017-12-28 DIAGNOSIS — I2510 Atherosclerotic heart disease of native coronary artery without angina pectoris: Secondary | ICD-10-CM | POA: Diagnosis present

## 2017-12-28 DIAGNOSIS — I50810 Right heart failure, unspecified: Secondary | ICD-10-CM | POA: Diagnosis not present

## 2017-12-28 DIAGNOSIS — I4891 Unspecified atrial fibrillation: Secondary | ICD-10-CM | POA: Diagnosis not present

## 2017-12-28 DIAGNOSIS — D631 Anemia in chronic kidney disease: Secondary | ICD-10-CM | POA: Diagnosis present

## 2017-12-28 DIAGNOSIS — I081 Rheumatic disorders of both mitral and tricuspid valves: Secondary | ICD-10-CM | POA: Diagnosis present

## 2017-12-28 DIAGNOSIS — R0989 Other specified symptoms and signs involving the circulatory and respiratory systems: Secondary | ICD-10-CM | POA: Diagnosis not present

## 2017-12-28 DIAGNOSIS — Z794 Long term (current) use of insulin: Secondary | ICD-10-CM | POA: Diagnosis not present

## 2017-12-28 DIAGNOSIS — E1151 Type 2 diabetes mellitus with diabetic peripheral angiopathy without gangrene: Secondary | ICD-10-CM | POA: Diagnosis present

## 2017-12-28 DIAGNOSIS — I482 Chronic atrial fibrillation: Secondary | ICD-10-CM | POA: Diagnosis not present

## 2017-12-28 DIAGNOSIS — E876 Hypokalemia: Secondary | ICD-10-CM | POA: Diagnosis present

## 2017-12-28 DIAGNOSIS — Z7901 Long term (current) use of anticoagulants: Secondary | ICD-10-CM | POA: Diagnosis not present

## 2017-12-28 DIAGNOSIS — E871 Hypo-osmolality and hyponatremia: Secondary | ICD-10-CM | POA: Diagnosis present

## 2017-12-28 DIAGNOSIS — Z9981 Dependence on supplemental oxygen: Secondary | ICD-10-CM | POA: Diagnosis not present

## 2017-12-28 DIAGNOSIS — E1121 Type 2 diabetes mellitus with diabetic nephropathy: Secondary | ICD-10-CM | POA: Diagnosis present

## 2017-12-28 DIAGNOSIS — J206 Acute bronchitis due to rhinovirus: Secondary | ICD-10-CM | POA: Diagnosis not present

## 2017-12-28 DIAGNOSIS — T502X5A Adverse effect of carbonic-anhydrase inhibitors, benzothiadiazides and other diuretics, initial encounter: Secondary | ICD-10-CM | POA: Diagnosis present

## 2017-12-28 DIAGNOSIS — I5043 Acute on chronic combined systolic (congestive) and diastolic (congestive) heart failure: Secondary | ICD-10-CM | POA: Diagnosis present

## 2017-12-28 DIAGNOSIS — I428 Other cardiomyopathies: Secondary | ICD-10-CM | POA: Diagnosis present

## 2017-12-28 DIAGNOSIS — J9811 Atelectasis: Secondary | ICD-10-CM | POA: Diagnosis present

## 2017-12-28 DIAGNOSIS — I340 Nonrheumatic mitral (valve) insufficiency: Secondary | ICD-10-CM | POA: Diagnosis not present

## 2017-12-28 DIAGNOSIS — M1A9XX0 Chronic gout, unspecified, without tophus (tophi): Secondary | ICD-10-CM | POA: Diagnosis not present

## 2017-12-28 DIAGNOSIS — Z950 Presence of cardiac pacemaker: Secondary | ICD-10-CM | POA: Diagnosis not present

## 2017-12-28 DIAGNOSIS — K50111 Crohn's disease of large intestine with rectal bleeding: Secondary | ICD-10-CM | POA: Diagnosis present

## 2017-12-28 DIAGNOSIS — N183 Chronic kidney disease, stage 3 (moderate): Secondary | ICD-10-CM | POA: Diagnosis not present

## 2017-12-28 DIAGNOSIS — J441 Chronic obstructive pulmonary disease with (acute) exacerbation: Secondary | ICD-10-CM | POA: Diagnosis present

## 2017-12-28 DIAGNOSIS — I480 Paroxysmal atrial fibrillation: Secondary | ICD-10-CM | POA: Diagnosis present

## 2017-12-28 DIAGNOSIS — N179 Acute kidney failure, unspecified: Secondary | ICD-10-CM | POA: Diagnosis not present

## 2017-12-28 DIAGNOSIS — R531 Weakness: Secondary | ICD-10-CM | POA: Diagnosis not present

## 2017-12-28 DIAGNOSIS — I5031 Acute diastolic (congestive) heart failure: Secondary | ICD-10-CM | POA: Diagnosis not present

## 2017-12-28 DIAGNOSIS — R609 Edema, unspecified: Secondary | ICD-10-CM | POA: Diagnosis not present

## 2017-12-28 DIAGNOSIS — J438 Other emphysema: Secondary | ICD-10-CM | POA: Diagnosis not present

## 2017-12-28 DIAGNOSIS — D72829 Elevated white blood cell count, unspecified: Secondary | ICD-10-CM | POA: Diagnosis not present

## 2017-12-28 DIAGNOSIS — Z452 Encounter for adjustment and management of vascular access device: Secondary | ICD-10-CM | POA: Diagnosis not present

## 2017-12-28 DIAGNOSIS — K5010 Crohn's disease of large intestine without complications: Secondary | ICD-10-CM | POA: Diagnosis not present

## 2017-12-28 LAB — BASIC METABOLIC PANEL
Anion gap: 13 (ref 5–15)
BUN: 43 mg/dL — ABNORMAL HIGH (ref 6–20)
CALCIUM: 8.1 mg/dL — AB (ref 8.9–10.3)
CHLORIDE: 96 mmol/L — AB (ref 101–111)
CO2: 28 mmol/L (ref 22–32)
CREATININE: 1.65 mg/dL — AB (ref 0.44–1.00)
GFR calc Af Amer: 33 mL/min — ABNORMAL LOW (ref >60)
GFR calc non Af Amer: 29 mL/min — ABNORMAL LOW (ref >60)
GLUCOSE: 124 mg/dL — AB (ref 65–99)
Potassium: 3.6 mmol/L (ref 3.5–5.1)
Sodium: 137 mmol/L (ref 135–145)

## 2017-12-28 LAB — GLUCOSE, CAPILLARY
GLUCOSE-CAPILLARY: 188 mg/dL — AB (ref 65–99)
GLUCOSE-CAPILLARY: 140 mg/dL — AB (ref 65–99)
GLUCOSE-CAPILLARY: 161 mg/dL — AB (ref 65–99)
Glucose-Capillary: 127 mg/dL — ABNORMAL HIGH (ref 65–99)

## 2017-12-28 NOTE — Evaluation (Addendum)
Physical Therapy Evaluation Patient Details Name: [name] MRN: [id number] DOB: 05-02-1941 Today's Date: 12/28/2017   History of Present Illness  Pt is a 77 y.o. female admitted 12/25/17 with weakness and LE edema; worked up for CHF exacerbation. PMH includes DMII, HTN, a-fib, OSA, CAD, CHF, CKD III, gout, pacemaker.    Clinical Impression  Pt presents with an overall decrease in functional mobility secondary to above. PTA, pt mod indep with RW and drives; unsure if she can have support from family/friends at discharge. Pt sitting in chair and currently refusing to stand due to continued bilateral feet swelling and pain. Educ on importance of mobility to maintain strength. Reports no concerns with mobility at home. Unsure if pt will require follow-up SNF-level therapies or HH services, but will follow closely to assess this need when pt willing to participate.     Follow Up Recommendations Home health PT;SNF(unsure HHPT vs. SNF since pt refusing to stand)    Equipment Recommendations  None recommended by PT    Recommendations for Other Services       Precautions / Restrictions Precautions Precautions: Fall Restrictions Weight Bearing Restrictions: No      Mobility  Bed Mobility               General bed mobility comments: Received sitting in chair  Transfers                 General transfer comment: Refused standing due to bilateral foot pain; agreeable to LE MMT in sitting  Ambulation/Gait                Stairs            Wheelchair Mobility    Modified Rankin (Stroke Patients Only)       Balance Overall balance assessment: Needs assistance   Sitting balance-Leahy Scale: Good                                       Pertinent Vitals/Pain Pain Assessment: Faces Faces Pain Scale: Hurts even more Pain Location: Bilateral feet Pain Descriptors / Indicators: Sore Pain Intervention(s): Limited activity within patient's  tolerance    Home Living Family/patient expects to be discharged to:: Private residence Living Arrangements: Alone Available Help at Discharge: Family;Available PRN/intermittently Type of Home: House Home Access: Stairs to enter Entrance Stairs-Rails: None Entrance Stairs-Number of Steps: threshold Home Layout: One level Home Equipment: Walker - 2 wheels;Walker - 4 wheels;Shower seat;Bedside commode      Prior Function Level of Independence: Independent with assistive device(s)         Comments: Mod indep with RW. Drives     Hand Dominance        Extremity/Trunk Assessment   Upper Extremity Assessment Upper Extremity Assessment: Overall WFL for tasks assessed    Lower Extremity Assessment Lower Extremity Assessment: RLE deficits/detail;LLE deficits/detail RLE Deficits / Details: Knee flex/ext 4/5, hip flex 4/5 RLE: Unable to fully assess due to pain RLE Coordination: decreased fine motor LLE Deficits / Details: Knee flex/ext 4/5, hip flex 3/5 LLE: Unable to fully assess due to pain LLE Coordination: decreased fine motor       Communication      Cognition Arousal/Alertness: Awake/alert Behavior During Therapy: Flat affect Overall Cognitive Status: Within Functional Limits for tasks assessed  General Comments      Exercises     Assessment/Plan    PT Assessment Patient needs continued PT services  PT Problem List Decreased strength;Decreased activity tolerance;Decreased mobility       PT Treatment Interventions DME instruction;Gait training;Stair training;Functional mobility training;Therapeutic activities;Therapeutic exercise;Balance training;Patient/family education    PT Goals (Current goals can be found in the Care Plan section)  Acute Rehab PT Goals Patient Stated Goal: Less pain PT Goal Formulation: With patient Time For Goal Achievement: 01/05/2018 Potential to Achieve Goals: Good     Frequency Min 3X/week   Barriers to discharge Decreased caregiver support      Co-evaluation               AM-PAC PT "6 Clicks" Daily Activity  Outcome Measure Difficulty turning over in bed (including adjusting bedclothes, sheets and blankets)?: A Little Difficulty moving from lying on back to sitting on the side of the bed? : A Little Difficulty sitting down on and standing up from a chair with arms (e.g., wheelchair, bedside commode, etc,.)?: A Little Help needed moving to and from a bed to chair (including a wheelchair)?: A Little Help needed walking in hospital room?: A Lot Help needed climbing 3-5 steps with a railing? : A Lot 6 Click Score: 16    End of Session   Activity Tolerance: Patient limited by pain Patient left: in chair;with call bell/phone within reach Nurse Communication: Mobility status PT Visit Diagnosis: Other abnormalities of gait and mobility (R26.89);Pain Pain - part of body: Leg;Ankle and joints of foot    Time: 1607-3710 PT Time Calculation (min) (ACUTE ONLY): 15 min   Charges:   PT Evaluation $PT Eval Moderate Complexity: 1 Mod     PT G Codes:       Mabeline Caras, PT, DPT Acute Rehab Services  Pager: [place] 12/28/2017, 10:04 AM

## 2017-12-28 NOTE — Progress Notes (Signed)
PROGRESS NOTE    [name]  JOI:[id number] DOB: Aug 04, 1941 DOA: 12/25/2017 PCP: [name], MD    Brief Narrative:  77 year old female who presented with lower segment edema. She does have the significant past medical history for type 2 diabetes mellitus, hypertension, dyslipidemia, hypothyroidism, paroxysmal atrial ventilation and diastolic heart failure. She was noted to have significant, worsening lower extremity edema, she was evaluated by the gastroenterology clinic for further evaluation. On initial physical examination blood pressure 117/67, heart rate 59, temperature 99.1, oxygen saturation 96%, respiratory rate 29. Moist mucous membranes, lungs with rales at bases bilaterally, no wheezing, positive JVD, heart S1-S2 present and rhythmic, no gallops, rubs or murmurs, abdomen soft nontender, positive lower extremity edema. Sodium 138, potassium 3.4, chloride 98, bicarbonate 28, glucose 106, BUN 43, creatinine 1.67, BNP 2089, white count 8.1, hemoglobin 11.2, hematocrit 39.2, platelets 383. Chest x-ray, cardiomegaly, small left pleural effusion, increase hilar vascular congestion. EKG with 66 bpm, 100% V paced rhythm.   Patient was admitted to hospital with the working diagnosis of volume overload due to acute on chronic diastolic heart failure exacerbation   Assessment & Plan:   Principal Problem:   Edema Active Problems:   Atrial fibrillation (HCC)   CKD (chronic kidney disease) stage 3, GFR 30-59 ml/min (HCC)   Hypokalemia   Anemia   1. Acute on chronic diastolic heart failure exacerbation. Contine  diuresis with IV furosemide 40 mg IV bid, urine output over last 24 hours, 2325 ml, strict in and out and daily weight. Continue metoprolol and spironolactone for heart failure management. Continue to hold on losartan to prevent hypotension. Systolic blood pressure 470 mmHg. Fluid restriction 1200 ml per 24 hours.   2. Paroxysmal atrial fibrillation. Contine rate control with  metoprolol, anticoagulation with apixaban.   3. Hypertension. On metoprolol, continue to hold on losartan, systolic blood pressure has been in the low 100's.  4. Hypothyroidism. Continue levothyroxine  5. COPD with pulmonary hypertension. No signs of exacerbation. On bronchodilator with breo and tiotropium. Continue sildenafil.  6. Type 2 diabetes mellitus. Continue insulin sliding scale for glucose cover and monitoring. Capillary glucose 153, 152, 142, 127, 188.   7. CKD stage 3 with calculated GFR 33 with a baseline cr at 1,5. Renal function with serum cr at 1,65, with K at 3,6 and serum bicarbonate at 28. Continue diuresis, urine output above 2 liters over las 24 hours.  8. Gout. No clinical signs of active flare, continue colchicine.   DVT prophylaxis:apixaban Code Status:full Family Communication:no family at the bedside Disposition Plan:home   Consultants:    Procedures:    Antimicrobials:     Subjective: Persistent lower extremity edema, tender to palpation and unable to ambulate, no significant dyspnea or chest pain.   Objective: Vitals:   12/27/17 0839 12/27/17 1026 12/27/17 2002 12/28/17 0510  BP:  (!) 90/52 (!) 95/57 (!) 98/46  Pulse:  85 78 78  Resp:   18 17  Temp:   98.8 F (37.1 C) 97.7 F (36.5 C)  TempSrc:   Oral Axillary  SpO2: (!) 7%  100% 100%  Weight:    94.8 kg (208 lb 14.4 oz)  Height:        Intake/Output Summary (Last 24 hours) at 12/28/2017 0814 Last data filed at 12/28/2017 0724 Gross per 24 hour  Intake 1200 ml  Output 2525 ml  Net -1325 ml   Filed Weights   12/26/17 1300 12/27/17 0315 12/28/17 0510  Weight: 95.2 kg (209 lb 14.1  oz) 92 kg (202 lb 14.4 oz) 94.8 kg (208 lb 14.4 oz)    Examination:   General: Not in pain or dyspnea, deconditioned  Neurology: Awake and alert, non focal  E ENT: mild pallor, no icterus, oral mucosa moist Cardiovascular: No JVD. S1-S2 present, rhythmic, no gallops, rubs, or  murmurs. +++ pitting bilateral lower extremity edema. Pulmonary: decreased breath sounds bilaterally, adequate air movement, no wheezing, rhonchi or rales. Gastrointestinal. Abdomen protuberant with no organomegaly, non tender, no rebound or guarding Skin. No rashes Musculoskeletal: no joint deformities     Data Reviewed: I have personally reviewed following labs and imaging studies  CBC: Recent Labs  Lab 12/25/17 1737  WBC 8.1  HGB 11.2*  HCT 39.2  MCV 90.1  PLT 115   Basic Metabolic Panel: Recent Labs  Lab 12/25/17 1737 12/27/17 0249 12/28/17 0535  NA 138 137 137  K 3.4* 3.6 3.6  CL 98* 98* 96*  CO2 28 28 28   GLUCOSE 106* 134* 124*  BUN 43* 41* 43*  CREATININE 1.67* 1.63* 1.65*  CALCIUM 8.0* 7.8* 8.1*   GFR: Estimated Creatinine Clearance: 35.6 mL/min (A) (by C-G formula based on SCr of 1.65 mg/dL (H)). Liver Function Tests: No results for input(s): AST, ALT, ALKPHOS, BILITOT, PROT, ALBUMIN in the last 168 hours. No results for input(s): LIPASE, AMYLASE in the last 168 hours. No results for input(s): AMMONIA in the last 168 hours. Coagulation Profile: No results for input(s): INR, PROTIME in the last 168 hours. Cardiac Enzymes: No results for input(s): CKTOTAL, CKMB, CKMBINDEX, TROPONINI in the last 168 hours. BNP (last 3 results) No results for input(s): PROBNP in the last 8760 hours. HbA1C: No results for input(s): HGBA1C in the last 72 hours. CBG: Recent Labs  Lab 12/27/17 0718 12/27/17 1228 12/27/17 1728 12/27/17 2038 12/28/17 0721  GLUCAP 126* 153* 152* 142* 127*   Lipid Profile: No results for input(s): CHOL, HDL, LDLCALC, TRIG, CHOLHDL, LDLDIRECT in the last 72 hours. Thyroid Function Tests: No results for input(s): TSH, T4TOTAL, FREET4, T3FREE, THYROIDAB in the last 72 hours. Anemia Panel: No results for input(s): VITAMINB12, FOLATE, FERRITIN, TIBC, IRON, RETICCTPCT in the last 72 hours.    Radiology Studies: I have reviewed all of the  imaging during this hospital visit personally     Scheduled Meds: . apixaban  5 mg Oral BID  . atorvastatin  80 mg Oral QHS  . fluticasone furoate-vilanterol  1 puff Inhalation Daily  . folic acid  1 mg Oral Daily  . furosemide  40 mg Intravenous Q12H  . insulin aspart  0-9 Units Subcutaneous TID WC  . levothyroxine  100 mcg Oral QAC breakfast  . macitentan  10 mg Oral Daily  . metoprolol tartrate  50 mg Oral BID  . multivitamin  1 tablet Oral Daily  . multivitamin with minerals  1 tablet Oral Daily  . pantoprazole  40 mg Oral Daily  . ranolazine  500 mg Oral BID  . sildenafil  40 mg Oral TID  . spironolactone  12.5 mg Oral Daily  . sulfaSALAzine  1,000 mg Oral BID WC  . tiotropium  18 mcg Inhalation Daily   Continuous Infusions:   LOS: 0 days        Mauricio Gerome Apley, MD [facility] Pager [phone]

## 2017-12-29 ENCOUNTER — Telehealth (HOSPITAL_COMMUNITY): Payer: Self-pay | Admitting: Pharmacist

## 2017-12-29 ENCOUNTER — Inpatient Hospital Stay (HOSPITAL_COMMUNITY): Payer: Medicare Other

## 2017-12-29 DIAGNOSIS — I482 Chronic atrial fibrillation: Secondary | ICD-10-CM

## 2017-12-29 DIAGNOSIS — I50813 Acute on chronic right heart failure: Secondary | ICD-10-CM

## 2017-12-29 DIAGNOSIS — R531 Weakness: Secondary | ICD-10-CM

## 2017-12-29 LAB — GLUCOSE, CAPILLARY
GLUCOSE-CAPILLARY: 161 mg/dL — AB (ref 65–99)
GLUCOSE-CAPILLARY: 141 mg/dL — AB (ref 65–99)
Glucose-Capillary: 120 mg/dL — ABNORMAL HIGH (ref 65–99)
Glucose-Capillary: 133 mg/dL — ABNORMAL HIGH (ref 65–99)

## 2017-12-29 LAB — BASIC METABOLIC PANEL
Anion gap: 14 (ref 5–15)
BUN: 40 mg/dL — AB (ref 6–20)
CALCIUM: 8 mg/dL — AB (ref 8.9–10.3)
CO2: 27 mmol/L (ref 22–32)
Chloride: 95 mmol/L — ABNORMAL LOW (ref 101–111)
Creatinine, Ser: 1.47 mg/dL — ABNORMAL HIGH (ref 0.44–1.00)
GFR calc non Af Amer: 33 mL/min — ABNORMAL LOW (ref >60)
GFR, EST AFRICAN AMERICAN: 39 mL/min — AB (ref >60)
Glucose, Bld: 124 mg/dL — ABNORMAL HIGH (ref 65–99)
Potassium: 3.5 mmol/L (ref 3.5–5.1)
Sodium: 136 mmol/L (ref 135–145)

## 2017-12-29 MED ORDER — POTASSIUM CHLORIDE CRYS ER 20 MEQ PO TBCR
40.0000 meq | EXTENDED_RELEASE_TABLET | Freq: Once | ORAL | Status: AC
  Administered 2017-12-29: 40 meq via ORAL
  Filled 2017-12-29: qty 2

## 2017-12-29 MED ORDER — INSULIN GLARGINE 100 UNIT/ML ~~LOC~~ SOLN
5.0000 [IU] | Freq: Every day | SUBCUTANEOUS | Status: DC
  Administered 2017-12-29 – 2018-01-08 (×11): 5 [IU] via SUBCUTANEOUS
  Filled 2017-12-29 (×11): qty 0.05

## 2017-12-29 MED ORDER — FUROSEMIDE 10 MG/ML IJ SOLN
80.0000 mg | Freq: Two times a day (BID) | INTRAMUSCULAR | Status: DC
  Administered 2017-12-29 – 2017-12-30 (×3): 80 mg via INTRAVENOUS
  Filled 2017-12-29 (×2): qty 8

## 2017-12-29 NOTE — Progress Notes (Signed)
Progress Note    [name]  PNT:[id number] DOB: 26-Mar-1941  DOA: 12/25/2017 PCP: [name], MD    Brief Narrative:   Chief complaint: Edema secondary to diastolic CHF  Medical records reviewed and are as summarized below:  [name] is an 77 y.o. female with a PMH of diabetes, hypertension, hyperlipidemia, hypothyroidism, OSA, CAD, PAF with history of pacemaker, chronic diastolic CHF with an EF of 55% was admitted 12/25/17 for evaluation of edema by her gastroenterologist after she presented to his office for evaluation of anemia previously treated with iron transfusion. In the ED, a chest x-ray shows small left pleural effusion but no edema. BNP was 2089.7 and troponin was 0.07.  Assessment/Plan:   Principal Problem:   Acute on chronic diastolic CHF/Edema Patient's admission presentation was consistent with decompensated diastolic CHF and she was placed on Lasix 40 mg IV twice a day ongoing treatment with metoprolol/spironolactone. Losartan was held given soft BP. Fluid restriction was undertaken with 1200 mL/24 hours. Over the course of her hospitalization, the patient has diuresed well. Weight unchanged over the past 24 hours. Renal function stable with diuresis. Lower extremities remain massively edematous. Patient requests that we discuss her care with Dr. Sung Amabile, her heart failure physician, so I have requested consultation.  Active Problems:   Generalized weakness The patient lives alone and we will need to ascertain that she can safely return there given her generalized weakness. She has refused to participate in a PT evaluation due to her lower extremity edema.    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 CBGs 120-188. We'll add 5 units of Lantus. Normally takes 10 units at home.    Atrial fibrillation (HCC) Continue rate control with metoprolol, on chronic Apixaban for  anticoagulation.    Hypothyroidism Continue Synthroid.    Hypertension  Blood pressures have been a bit soft with diuresis. Continue metoprolol. Losartan remains on hold.    CKD (chronic kidney disease) stage 3, GFR 30-59 ml/min (HCC) Baseline creatinine appears to be 1.8. Renal function slightly improved over baseline values.    Hypokalemia Secondary to diuresis. Resolved.    Anemia Hemoglobin stable at 11.2.    Hyperlipidemia Continue Lipitor.   Body mass index is 29.84 kg/m.   Family Communication/Anticipated D/C date and plan/Code Status   DVT prophylaxis: Apixiban ordered. Code Status: Full Code.  Family Communication: No family currently at the bedside. Disposition Plan: Home when she can safely manage on her own. Otherwise, may need SNF.   Medical Consultants:    Cardiology   Anti-Infectives:    None  Subjective:   Patient continues to feel weak and has refused a physical therapy evaluation secondary to pain in her legs which remain massively swollen. Although she doesn't complain of shortness of breath, when speaking, she does appear to be winded. No reports of chest pain. Bowels moving okay.  Objective:    Vitals:   12/28/17 1917 12/28/17 2044 12/29/17 0451 12/29/17 0802  BP: (!) 90/47 112/62 (!) 106/44   Pulse: 68  79   Resp: 20  20   Temp: 97.6 F (36.4 C)  98.6 F (37 C)   TempSrc: Oral  Oral   SpO2: 98%  97% 94%  Weight:   94.3 kg (208 lb)   Height:        Intake/Output Summary (Last 24 hours) at 12/29/2017 0826 Last data filed at 12/29/2017 0730 Gross per 24  hour  Intake 1320 ml  Output 1901 ml  Net -581 ml   Filed Weights   12/27/17 0315 12/28/17 0510 12/29/17 0451  Weight: 92 kg (202 lb 14.4 oz) 94.8 kg (208 lb 14.4 oz) 94.3 kg (208 lb)    Exam: General: Elderly female sitting up in chair in no acute distress. Cardiovascular: Heart sounds are irregular. No gallops or rubs. No murmurs. No JVD. Lungs: Clear to auscultation  bilaterally but diminished in the bases. No rales, rhonchi or wheezes. Abdomen: Soft, nontender, nondistended with normal active bowel sounds. No masses. No hepatosplenomegaly. Neurological: Alert and oriented 3. Moves all extremities 4 with equal but diminished strength. Cranial nerves II through XII grossly intact. Skin: Warm and dry. No rashes or lesions. Extremities: No clubbing or cyanosis. 3+ pitting edema. Pedal pulses 2+. Psychiatric: Mood and affect are normal. Insight and judgment are fair.   Data Reviewed:   I have personally reviewed following labs and imaging studies:  Labs: Labs show the following:   Basic Metabolic Panel: Recent Labs  Lab 12/25/17 1737 12/27/17 0249 12/28/17 0535 12/29/17 0240  NA 138 137 137 136  K 3.4* 3.6 3.6 3.5  CL 98* 98* 96* 95*  CO2 28 28 28 27   GLUCOSE 106* 134* 124* 124*  BUN 43* 41* 43* 40*  CREATININE 1.67* 1.63* 1.65* 1.47*  CALCIUM 8.0* 7.8* 8.1* 8.0*   GFR Estimated Creatinine Clearance: 39.9 mL/min (A) (by C-G formula based on SCr of 1.47 mg/dL (H)).  CBC: Recent Labs  Lab 12/25/17 1737  WBC 8.1  HGB 11.2*  HCT 39.2  MCV 90.1  PLT 383   CBG: Recent Labs  Lab 12/28/17 0721 12/28/17 1120 12/28/17 1623 12/28/17 2127 12/29/17 0743  GLUCAP 127* 188* 140* 161* 120*    Microbiology No results found for this or any previous visit (from the past 240 hour(s)).  Procedures and diagnostic studies:  No results found.  Medications:   . apixaban  5 mg Oral BID  . atorvastatin  80 mg Oral QHS  . fluticasone furoate-vilanterol  1 puff Inhalation Daily  . folic acid  1 mg Oral Daily  . furosemide  40 mg Intravenous Q12H  . insulin aspart  0-9 Units Subcutaneous TID WC  . levothyroxine  100 mcg Oral QAC breakfast  . macitentan  10 mg Oral Daily  . metoprolol tartrate  50 mg Oral BID  . multivitamin  1 tablet Oral Daily  . multivitamin with minerals  1 tablet Oral Daily  . pantoprazole  40 mg Oral Daily  .  ranolazine  500 mg Oral BID  . sildenafil  40 mg Oral TID  . spironolactone  12.5 mg Oral Daily  . sulfaSALAzine  1,000 mg Oral BID WC  . tiotropium  18 mcg Inhalation Daily   Continuous Infusions:   LOS: 1 day   Jacquelynn Cree  [facility] Pager [phone]. If unable to reach me by pager, please call my cell phone at [phone].  *Please refer to [email or web address], password [email or web address] to get updated schedule on who will round on this patient, as hospitalists switch teams weekly. If 7PM-7AM, please contact night-coverage at [email or web address], password [email or web address] for any overnight needs.  12/29/2017, 8:26 AM

## 2017-12-29 NOTE — Plan of Care (Signed)
  Problem: Health Behavior/Discharge Planning: Goal: Ability to manage health-related needs will improve Outcome: Progressing   Problem: Clinical Measurements: Goal: Ability to maintain clinical measurements within normal limits will improve Outcome: Progressing   Problem: Clinical Measurements: Goal: Respiratory complications will improve Outcome: Progressing   

## 2017-12-29 NOTE — Discharge Instructions (Signed)

## 2017-12-29 NOTE — Consult Note (Addendum)
Advanced Heart Failure Team Consult Note   Primary Physician: [name], MD PCP-Cardiologist:  No primary care provider on file.  HF MD: Dr Haroldine Laws   Reason for Consultation: Heart Failure   HPI:    [name] is seen today for evaluation of acute/chronic diastolic heart failure at the request of Dr Rockne Menghini .   [name] is a 77 year old with history of Diabetes, HTN, hyperlipidemia, hypothyroidism, obesity, tachy/brady s/p PPM, COPD, GERD, Af ib, CAD, and pulmonary hypertension.   She has been followed by Dr Vaughan Browner and was last seen 03/2017. At that time she had lost weight and was stable. She was on combination therapy macitentan and sildenafil. Weight at that time was 212 pounds.   Over the last month she developed fatigue. Saw PCP and she was anemic so she was referred to GI. Iron was started but she had little improvement.   She ran out of Macitentan about 10 days ago and did not call for a refill.   She has not been weighing at home and says she developed lower extremity edema over the last few weeks. She went back to her PCP on 3/28 and hgb had improved but due to worsening dyspnea she was sent to the ED for further evaluation.  Admitted with increased shortness of breath. CXR on admit with small left pleural effusion and mild atelectasis. Also had calcification LAD. Pertinent admission labs included: BNP 2089, creatinine 1.6, K 3.4, hgb 11.2, troponin 0.07. Diuresing with IV lasix. Weight down 9 pounds since admit.   Echo 03/2017  - Normal LV size with mild LV hypertrophy. EF 40-45%, diffuse   hypokinesis. Moderately dilated RV with moderately decreased   systolic function. D-shaped interventricular septum suggestive of   RV pressure/volume overload. Biatrial enlargement. Moderate   pulmonary hypertension. Moderate TR.  Echo 9/17  LVEF 45-50% RV severely dilated and moderately reduced, IVC dilated, small pericardial effusion, Peak pressure 80-62mm HG septum  flat  Echo 9/17  LVEF 45-50% RV severely dilated and moderately reduced, IVC dilated, small pericardial effusion, Peak pressure 80-44mm HG septum flat  RHC 07/15/16 RA = 11 RV = 62/12 PA = 61/23 (38) PCW = 14 Fick cardiac output/index = 4.0/1.8 PVR = 5.9 WU Ao sat = 94% PA sat = 54%, 55%  RHC 01/09/16 RA = 15 RV = 52/8/12 PA = 54/28 (39) PCW = 13 Fick cardiac output/index = 3.5/1.5 Thermo CO/CI = 4.4/1.9 PVR = 5.9 WU Ao sat = 95% PA sat = 54%, 51%  LHC 2013  LAD 70-75% lesion and ostial 95% D2  Review of Systems: [y] = yes, [ ]  = no   General: Weight gain [ ] ; Weight loss [ ] ; Anorexia [ ] ; Fatigue [Y ]; Fever [ ] ; Chills [ ] ; Weakness [Y ]  Cardiac: Chest pain/pressure [ ] ; Resting SOB [ ] ; Exertional SOB [Y ]; Orthopnea [ ] ; Pedal Edema [ ] ; Palpitations [ ] ; Syncope [ ] ; Presyncope [ ] ; Paroxysmal nocturnal dyspnea[ ]   Pulmonary: Cough [ ] ; Wheezing[ ] ; Hemoptysis[ ] ; Sputum [ ] ; Snoring [ ]   GI: Vomiting[ ] ; Dysphagia[ ] ; Melena[ ] ; Hematochezia [ ] ; Heartburn[ ] ; Abdominal pain [ ] ; Constipation [ ] ; Diarrhea [ ] ; BRBPR [ ]   GU: Hematuria[ ] ; Dysuria [ ] ; Nocturia[ ]   Vascular: Pain in legs with walking [ ] ; Pain in feet with lying flat [ ] ; Non-healing sores [ ] ; Stroke [ ] ; TIA [ ] ; Slurred speech [ ] ;  Neuro:  Headaches[ ] ; Vertigo[ ] ; Seizures[ ] ; Paresthesias[ ] ;Blurred vision [ ] ; Diplopia [ ] ; Vision changes [ ]   Ortho/Skin: Arthritis [ ] ; Joint pain [Y ]; Muscle pain [ ] ; Joint swelling [ ] ; Back Pain [ ] ; Rash [ ]   Psych: Depression[ ] ; Anxiety[ ]   Heme: Bleeding problems [ ] ; Clotting disorders [ ] ; Anemia [ ]   Endocrine: Diabetes [Y ]; Thyroid dysfunction[ ]   Home Medications Prior to Admission medications   Medication Sig Start Date End Date Taking? Authorizing Provider  ADVAIR DISKUS 500-50 MCG/DOSE AEPB Inhale 1 puff into the lungs every 12 (twelve) hours. 05/15/16  Yes [provider]  albuterol (PROVENTIL HFA;VENTOLIN HFA) 108 (90 BASE)  MCG/ACT inhaler Inhale 2 puffs into the lungs every 6 (six) hours as needed for wheezing or shortness of breath.   Yes [provider]  apixaban (ELIQUIS) 5 MG TABS tablet Take 1 tablet (5 mg total) by mouth 2 (two) times daily. 07/29/17  Yes Bensimhon, Shaune Pascal, MD  atorvastatin (LIPITOR) 80 MG tablet Take 80 mg by mouth at bedtime.     Yes [provider]  colchicine 0.6 MG tablet Take 0.6-1.2 mg by mouth daily as needed (for gout flares). Reported on 03/04/2016 08/09/15  Yes [provider]  diphenhydramine-acetaminophen (TYLENOL PM) 25-500 MG TABS tablet Take 1 tablet by mouth at bedtime as needed (sleep/pain). 01/12/16  Yes Florencia Reasons, MD  folic acid (FOLVITE) 1 MG tablet Take 1 mg by mouth daily. 06/01/15  Yes [provider]  KLOR-CON M20 20 MEQ tablet TAKE 1 TABLET (20 MEQ TOTAL) BY MOUTH 2 (TWO) TIMES DAILY. Patient taking differently: Take 20 mEq by mouth once a day 10/23/17  Yes Larey Dresser, MD  LANTUS 100 UNIT/ML injection Inject 10 Units into the skin daily before breakfast.  12/12/17  Yes [provider]  levothyroxine (SYNTHROID, LEVOTHROID) 100 MCG tablet Take 100 mcg by mouth daily. 05/02/16  Yes [provider]  losartan (COZAAR) 50 MG tablet TAKE 1 TABLET (50 MG TOTAL) BY MOUTH DAILY. 12/06/16  Yes Larey Dresser, MD  Macitentan (OPSUMIT) 10 MG TABS Take 10 mg by mouth daily.   Yes [provider]  metoprolol tartrate (LOPRESSOR) 50 MG tablet Take 1 tablet (50 mg total) by mouth 2 (two) times daily. 08/26/17  Yes Larey Dresser, MD  Multiple Vitamins-Minerals (MULTIVITAMIN WITH MINERALS) tablet Take 1 tablet by mouth daily.   Yes [provider]  Multiple Vitamins-Minerals (PRESERVISION AREDS 2 PO) Take 1 tablet by mouth 2 (two) times daily.    Yes [provider]  nystatin cream (MYCOSTATIN) Apply 1 application topically 2 (two) times daily as needed (for skin irritation).  04/01/16  Yes [provider]  omeprazole (PRILOSEC) 20 MG capsule Take 20 mg by mouth daily.   Yes [provider]  ranolazine (RANEXA) 500 MG 12 hr tablet Take 1 tablet by mouth twice daily. Patient taking differently: Take 500 mg by mouth 2 (two) times daily.  11/06/17  Yes Bensimhon, Shaune Pascal, MD  sildenafil (REVATIO) 20 MG tablet Take 2 tablets (40 mg total) by mouth 3 (three) times daily. 06/13/16  Yes Bensimhon, Shaune Pascal, MD  spironolactone (ALDACTONE) 25 MG tablet Take 0.5 tablets (12.5 mg total) by mouth daily. 11/21/17  Yes Bensimhon, Shaune Pascal, MD  sulfaSALAzine (AZULFIDINE) 500 MG tablet Take 1,000 mg by mouth 2 (two) times daily. 04/12/16  Yes [provider]  Tiotropium Bromide Monohydrate (SPIRIVA RESPIMAT) 2.5 MCG/ACT AERS INHALE 2  PUFFS INTO THE LUNGS DAILY. 07/26/16  Yes Brand Males, MD  torsemide (DEMADEX) 20 MG tablet TAKE 2 TABLETS BY MOUTH EVERY DAY Patient taking differently: Take 20 mg by mouth two times a day- morning and midday 12/22/17  Yes Bensimhon, Shaune Pascal, MD  dicyclomine (BENTYL) 20 MG tablet Take 1 tablet (20 mg total) by mouth 2 (two) times daily as needed for spasms (abdominal pain). Patient not taking: Reported on 12/25/2017 06/01/17   Gareth Morgan, MD  ELIQUIS 5 MG TABS tablet TAKE 1 TABLET TWICE A DAY Patient not taking: Reported on 12/25/2017 07/29/17   Bensimhon, Shaune Pascal, MD  Fluticasone-Salmeterol (ADVAIR) 250-50 MCG/DOSE AEPB Inhale 1 puff into the lungs every 12 (twelve) hours. Patient not taking: Reported on 12/25/2017 01/17/15   Kathee Delton, MD  nitroGLYCERIN (NITROSTAT) 0.4 MG SL tablet Place 1 tablet (0.4 mg total) under the tongue every 5 (five) minutes as needed for chest pain. Patient not taking: Reported on 12/25/2017 07/07/14 12/25/17  Deboraha Sprang, MD  allopurinol (ZYLOPRIM) 100 MG tablet Take 100 mg by mouth daily.    12/04/11  [provider]    Past Medical History: Past Medical History:  Diagnosis Date  . Atrial fibrillation ([place])     Status post pulmonary vein isolation [facility] at [facility]  . Atrial fibrillation ([place]) 12/18/2007   Annotation: refractory Qualifier: Diagnosis of  By: Doy Mince LPN, Megan    . CAD (coronary artery disease)    LHC 05/2007: pLAD 70-80%, pRCA 40%, EF 20-25%. LAD lesion treated medically.   . CHF (congestive heart failure) ([place])   . Chronic diastolic heart failure ([place])    Echo 06/2010: Moderate LVH, EF 55-65%, normal wall motion, mild MR, moderate to severe LAE, mild RAE.   Marland Kitchen Chronic obstructive pulmonary disease ([place])   . COPD (chronic obstructive pulmonary disease) ([place])   . Coronary artery disease 06/15/2012  . Crohn's disease ([place])   . Depression   . Diabetes mellitus ([place]) 06/22/2012  . Diabetes mellitus without complication ([place])   . Dysrhythmia    atrial fib  . GERD (gastroesophageal reflux disease)   . Gout   . Hyperlipidemia   . Hypothyroidism    treated  . Morbid obesity ([place])   . Morbid obesity with BMI of 40.0-44.9, adult ([place]) 06/22/2012  . Nonischemic cardiomyopathy ([place])    history of,  EF 20-25% at left heart cath in 06/2007; echo 2011 had normal LV function  . Obstructive sleep apnea    continuous positive airway pressure not using at present  . On home oxygen therapy    "2L at night" (10/03/2015)  . Osteoporosis   . Pacemaker    Medtronic  . Peripheral vascular disease ([facility]) 15   rt arm clots  . Seasonal allergies   . Shortness of breath dyspnea    exersion  . Tachy-brady syndrome ([place])    status post implant of a medtronic dual-chamber pacemaker in 2001.  explanted in 2010 -- [place] pacemaker in 2010.    Past Surgical History: Past Surgical History:  Procedure Laterality Date  . CARDIAC CATHETERIZATION  08/2000   noncritical disease mid RCA, EF preserved  . CARDIAC CATHETERIZATION  05/29/2007   noncritical disease, EF 20-25%  . CARDIAC CATHETERIZATION N/A 01/09/2016   Procedure: Right Heart Cath;  Surgeon: Jolaine Artist, MD;   Location: [facility];  Service: Cardiovascular;  Laterality: N/A;  . CARDIAC CATHETERIZATION N/A 07/15/2016   Procedure: Right Heart Cath;  Surgeon: Jolaine Artist, MD;  Location: [facility];  Service: Cardiovascular;  Laterality: N/A;  . CATARACT EXTRACTION W/ INTRAOCULAR LENS  IMPLANT, BILATERAL Bilateral 2014-2016  . EMBOLECTOMY Right 08/16/2014   Procedure: EMBOLECTOMY- RIGHT BRACHIAL ARTERY;  Surgeon: Serafina Mitchell, MD;  Location: [facility];  Service: Vascular;  Laterality: Right;  . PACEMAKER INSERTION  2010   medtronic ADAPTA   . PARATHYROIDECTOMY Right 01/30/2015   Procedure: PARATHYROIDECTOMY;  Surgeon: Armandina Gemma, MD;  Location: [facility];  Service: General;  Laterality: Right;  . PARATHYROIDECTOMY Left 10/03/2015   Left inferior parathyroidectomy w/neck exploration  . PARATHYROIDECTOMY Left 10/03/2015   Procedure: LEFT PARATHYROID EXPLORATION AND  PARATHYROIDECTOMY;  Surgeon: Armandina Gemma, MD;  Location: [facility];  Service: General;  Laterality: Left;  . post ablation pseudoaneurysm     at A fib ablation  . pulmonary vein isolation  05/12/2008   RFCA atrial fibrillation  . TONSILLECTOMY      Family History: Family History  Problem Relation Age of Onset  . Breast cancer Mother   . Heart disease Father   . Emphysema Father     Social History: Social History   Socioeconomic History  . Marital status: Divorced    Spouse name: Not on file  . Number of children: Not on file  . Years of education: Not on file  . Highest education level: Not on file  Occupational History  . Occupation: retired  Scientific laboratory technician  . Financial resource strain: Not on file  . Food insecurity:    Worry: Not on file    Inability: Not on file  . Transportation needs:    Medical: Not on file    Non-medical: Not on file  Tobacco Use  . Smoking status: Former Smoker    Packs/day: 3.00    Years: 32.00    Pack years: 96.00    Last attempt to quit: 09/30/1986    Years since quitting: 31.2    . Smokeless tobacco: Former Systems developer    Quit date: 08/15/1987  Substance and Sexual Activity  . Alcohol use: No    Alcohol/week: 0.0 oz  . Drug use: No  . Sexual activity: Never  Lifestyle  . Physical activity:    Days per week: Not on file    Minutes per session: Not on file  . Stress: Not on file  Relationships  . Social connections:    Talks on phone: Not on file    Gets together: Not on file    Attends religious service: Not on file    Active member of club or organization: Not on file    Attends meetings of clubs or organizations: Not on file    Relationship status: Not on file  Other Topics Concern  . Not on file  Social History Narrative   ** Merged History Encounter **        Allergies:  No Known Allergies  Objective:    Vital Signs:   Temp:  [97.6 F (36.4 C)-98.6 F (37 C)] 98.6 F (37 C) (04/01 0451) Pulse Rate:  [68-85] 79 (04/01 0451) Resp:  [20] 20 (04/01 0451) BP: (90-112)/(44-63) 106/44 (04/01 0451) SpO2:  [94 %-100 %] 94 % (04/01 0802) Weight:  [208 lb (94.3 kg)] 208 lb (94.3 kg) (04/01 0451) Last BM Date: 12/29/17  Weight change: Filed Weights   12/27/17 0315 12/28/17 0510 12/29/17 0451  Weight: 202 lb 14.4 oz (92 kg) 208 lb 14.4 oz (94.8 kg) 208 lb (94.3 kg)  Intake/Output:   Intake/Output Summary (Last 24 hours) at 12/29/2017 1052 Last data filed at 12/29/2017 1000 Gross per 24 hour  Intake 1180 ml  Output 1901 ml  Net -721 ml      Physical Exam    General:   No resp difficulty. Sitting in the chair.  HEENT: normal Neck: supple. JVP ~10 . Prominent CV waves. Carotids 2+ bilat; no bruits. No lymphadenopathy or thyromegaly appreciated. Cor: PMI nondisplaced. Regular rate & rhythm. No rubs, gallops or murmurs. Lungs: Decreased in the base. On 2 liters  Abdomen: soft, nontender, nondistended. No hepatosplenomegaly. No bruits or masses. Good bowel sounds. Extremities: no cyanosis, clubbing, rash, R and LLE 1+ edema Neuro: alert &  orientedx3, cranial nerves grossly intact. moves all 4 extremities w/o difficulty. Affect pleasant   Telemetry    V paced   EKG    V paced 70-80s bpm   Labs   Basic Metabolic Panel: Recent Labs  Lab 12/25/17 1737 12/27/17 0249 12/28/17 0535 12/29/17 0240  NA 138 137 137 136  K 3.4* 3.6 3.6 3.5  CL 98* 98* 96* 95*  CO2 28 28 28 27   GLUCOSE 106* 134* 124* 124*  BUN 43* 41* 43* 40*  CREATININE 1.67* 1.63* 1.65* 1.47*  CALCIUM 8.0* 7.8* 8.1* 8.0*    Liver Function Tests: No results for input(s): AST, ALT, ALKPHOS, BILITOT, PROT, ALBUMIN in the last 168 hours. No results for input(s): LIPASE, AMYLASE in the last 168 hours. No results for input(s): AMMONIA in the last 168 hours.  CBC: Recent Labs  Lab 12/25/17 1737  WBC 8.1  HGB 11.2*  HCT 39.2  MCV 90.1  PLT 383    Cardiac Enzymes: No results for input(s): CKTOTAL, CKMB, CKMBINDEX, TROPONINI in the last 168 hours.  BNP: BNP (last 3 results) Recent Labs    12/25/17 1737  BNP 2,089.7*    ProBNP (last 3 results) No results for input(s): PROBNP in the last 8760 hours.   CBG: Recent Labs  Lab 12/28/17 0721 12/28/17 1120 12/28/17 1623 12/28/17 2127 12/29/17 0743  GLUCAP 127* 188* 140* 161* 120*    Coagulation Studies: No results for input(s): LABPROT, INR in the last 72 hours.   Imaging    No results found.   Medications:     Current Medications: . apixaban  5 mg Oral BID  . atorvastatin  80 mg Oral QHS  . fluticasone furoate-vilanterol  1 puff Inhalation Daily  . folic acid  1 mg Oral Daily  . furosemide  40 mg Intravenous Q12H  . insulin aspart  0-9 Units Subcutaneous TID WC  . insulin glargine  5 Units Subcutaneous Daily  . levothyroxine  100 mcg Oral QAC breakfast  . macitentan  10 mg Oral Daily  . metoprolol tartrate  50 mg Oral BID  . multivitamin  1 tablet Oral Daily  . multivitamin with minerals  1 tablet Oral Daily  . pantoprazole  40 mg Oral Daily  . ranolazine  500 mg  Oral BID  . sildenafil  40 mg Oral TID  . spironolactone  12.5 mg Oral Daily  . sulfaSALAzine  1,000 mg Oral BID WC  . tiotropium  18 mcg Inhalation Daily     Infusions:     Patient Profile   [name] Schicker is a 77 year old with history of Diabetes, HTN, hyperlipidemia, hypothyroidism, obesity, tachy/brady s/p PPM, COPD, GERD, Af ib, CAD, and pulmonary hypertension.   Admitted with increased dyspnea.   Assessment/Plan   1.  A/C Combined Systolic/Diastolic Heart Failure  -Volume status improving diuresis. Increase lasix to 80 mg twice a day.  - Add 40 meq K daily.  - Repeat ECHO   2. 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 -Out of macitentan about 10 days. Restarted on admit.  -On combination therapy. Macitentan 10 mg daily + sildenafil 20 mg three times a day. On chronic oxygen.  - Will need RHC to evaluate hemodynamics once diuresed. I am concerned with ongoing fatigue she may have low output.   3. COPD On chronic oxgeyn   4. Natasha Mead- with medtronic PPM  5. Permanent A fib  Vpaced. On eliquis.   6. CAD  Cath 2013 with LAD 70-75% and ostial 95% D2.  - No s/s ischemia  -On statin and eliquis.   7. DMii -On insulin per Primary   8. CKD Stage III -Creatinine 1.6>1.47  -Followed by BMET daily.    Medication concerns reviewed with patient and pharmacy team. Barriers identified: Will need to verify with Pharmacy for macitentan.   Length of Stay: 1  Amy Clegg, NP  12/29/2017, 10:52 AM  Advanced Heart Failure Team Pager [phone] (M-F; 7a - 4p)  Please contact [facility] for night-coverage after hours (4p -7a ) and weekends on [email or web address]  Patient seen with NP, agree with the above note.  She has been out of macitentan for a couple of weeks.  She continues on sildenafil.  She came to the hospital because of worsening lower extremity edema, she was not particularly short of breath.  She has diuresed some in the hospital so far.   On exam, JVP 14 cm with HJR, 2/6  HSM LLSB, 2+ edema to knees.   1. Acute on chronic primarily diastolic CHF with prominent RV failure: In setting of pulmonary hypertension.  She has developed marked volume overload after being out of Opsumit for a couple of weeks.  She remains volume overloaded on exam despite some diuresis so far.  - Needs more diuresis, increase Lasix to 80 mg IV bid. Follow I/Os and weight.  2. Pulmonary hypertension: Suspect mixed group 1 and 3.  She has been on sildenafil and Opsumit, off Opsumit x 2 weeks with CHF exacerbation.  - Opsumit was restarted at admission.  - Continue sildenafil.  - Continue diuresis.  - Once she is fully diuresed, would arrange right heart cath.  3. CKD: Stage 3.  Creatinine stable so far with diuresis.  4. Atrial fibrillation: Permanent, she is on Eliquis.  5. COPD: on home oxygen.   Loralie Champagne 12/29/2017 2:37 PM

## 2017-12-29 NOTE — Progress Notes (Signed)
Nursing tech reports that pt up unassisted to recliner chair with steady gait. Pt declines chair alarm to recliner. Pt states its a waste of time.

## 2017-12-29 NOTE — Progress Notes (Signed)
Physical Therapy Treatment Patient Details Name: [name] MRN: [id number] DOB: Aug 25, 1941 Today's Date: 12/29/2017    History of Present Illness Pt is a 77 y.o. female admitted 12/25/17 with weakness and LE edema; worked up for CHF exacerbation. PMH includes DMII, HTN, a-fib, OSA, CAD, CHF, CKD III, gout, pacemaker.    PT Comments    Pt refused OOB based on pain in B feet.  Pt refused to rate pain and RN reports patient transferring to and from [facility] with assistance.  Pt presents with B quad weakness during therapeutic exercises. Increased WOB and fatigue noted.  Continued to educate patient on the benefits of mobility and patient remains to decline OOB.  Plan next session for transfers and gait training if patient is willing.    Follow Up Recommendations  Home health PT;SNF(HHPT vs SNF as patient remains to refuse assessment of mobility.  )     Equipment Recommendations  None recommended by PT    Recommendations for Other Services       Precautions / Restrictions Precautions Precautions: Fall Restrictions Weight Bearing Restrictions: No    Mobility  Bed Mobility Overal bed mobility: Needs Assistance             General bed mobility comments: Received sitting in chair  Transfers                 General transfer comment: Refused standing due to bilateral foot pain; agreeable to supine exercises.   Ambulation/Gait                 Stairs            Wheelchair Mobility    Modified Rankin (Stroke Patients Only)       Balance Overall balance assessment: (NT)                                          Cognition Arousal/Alertness: Awake/alert Behavior During Therapy: Flat affect Overall Cognitive Status: Within Functional Limits for tasks assessed                                        Exercises General Exercises - Lower Extremity Ankle Circles/Pumps: AROM;Both;20 reps;Supine Quad Sets:  AROM;Both;Supine;10 reps Heel Slides: AAROM;Both;10 reps;Supine Hip ABduction/ADduction: AROM;Both;10 reps;Supine Straight Leg Raises: AAROM;Both;10 reps;Supine    General Comments        Pertinent Vitals/Pain Pain Assessment: Faces Faces Pain Scale: Hurts even more Pain Location: Bilateral feet Pain Descriptors / Indicators: Sore Pain Intervention(s): Monitored during session;Limited activity within patient's tolerance    Home Living                      Prior Function            PT Goals (current goals can now be found in the care plan section) Acute Rehab PT Goals Patient Stated Goal: Less pain Potential to Achieve Goals: Good Progress towards PT goals: Progressing toward goals    Frequency           PT Plan Current plan remains appropriate    Co-evaluation              AM-PAC PT "6 Clicks" Daily Activity  Outcome Measure  Difficulty turning over in bed (including adjusting bedclothes, sheets and  blankets)?: A Little Difficulty moving from lying on back to sitting on the side of the bed? : A Little Difficulty sitting down on and standing up from a chair with arms (e.g., wheelchair, bedside commode, etc,.)?: Unable Help needed moving to and from a bed to chair (including a wheelchair)?: A Little Help needed walking in hospital room?: A Little Help needed climbing 3-5 steps with a railing? : A Lot 6 Click Score: 15    End of Session   Activity Tolerance: Patient limited by pain Patient left: in chair;with call bell/phone within reach Nurse Communication: Mobility status PT Visit Diagnosis: Other abnormalities of gait and mobility (R26.89);Pain Pain - part of body: Leg;Ankle and joints of foot     Time: 6153-7943 PT Time Calculation (min) (ACUTE ONLY): 9 min  Charges:  $Therapeutic Exercise: 8-22 mins                    G Codes:       [name], PTA pager [phone]    [name] 12/29/2017, 5:56 PM

## 2017-12-29 NOTE — Care Management Note (Signed)
Case Management Note  Patient Details  Name: [name] MRN: [id number] Date of Birth: [birth year]  Subjective/Objective:  CHF                 Action/Plan: Patient lives at home alone; Primary Physician: [name], MD; has private insurance with [facility] with prescription drug coverage; [facility] choice [facility]; DME - home oxygen and walker; patient plans to return home at discharge; [facility] choice offered, pt chose [place]; Dan with [facility] called for arrangements.  Expected Discharge Date:       Possibly 01/01/2018           Expected Discharge Plan:  [place]  Discharge planning Services  CM Consult  Choice offered to:  Patient    HH Arranged:  PT [place] Agency:  [place]  Status of Service:  In process, will continue to follow  Sherrilyn Rist [phone] 12/29/2017, 12:50 PM

## 2017-12-29 NOTE — Telephone Encounter (Signed)
Opsumit PA approved by [employer] through 09/29/18.   Ruta Hinds. Velva Harman, PharmD, BCPS, CPP Clinical Pharmacist Phone: [phone] 12/29/2017 3:45 PM

## 2017-12-30 ENCOUNTER — Inpatient Hospital Stay (HOSPITAL_COMMUNITY): Payer: Medicare Other

## 2017-12-30 DIAGNOSIS — I340 Nonrheumatic mitral (valve) insufficiency: Secondary | ICD-10-CM

## 2017-12-30 DIAGNOSIS — I2721 Secondary pulmonary arterial hypertension: Secondary | ICD-10-CM

## 2017-12-30 LAB — BASIC METABOLIC PANEL
ANION GAP: 15 (ref 5–15)
BUN: 46 mg/dL — ABNORMAL HIGH (ref 6–20)
CHLORIDE: 96 mmol/L — AB (ref 101–111)
CO2: 23 mmol/L (ref 22–32)
Calcium: 8.2 mg/dL — ABNORMAL LOW (ref 8.9–10.3)
Creatinine, Ser: 1.35 mg/dL — ABNORMAL HIGH (ref 0.44–1.00)
GFR calc non Af Amer: 37 mL/min — ABNORMAL LOW (ref >60)
GFR, EST AFRICAN AMERICAN: 43 mL/min — AB (ref >60)
Glucose, Bld: 123 mg/dL — ABNORMAL HIGH (ref 65–99)
POTASSIUM: 4.5 mmol/L (ref 3.5–5.1)
Sodium: 134 mmol/L — ABNORMAL LOW (ref 135–145)

## 2017-12-30 LAB — GLUCOSE, CAPILLARY
GLUCOSE-CAPILLARY: 119 mg/dL — AB (ref 65–99)
Glucose-Capillary: 102 mg/dL — ABNORMAL HIGH (ref 65–99)
Glucose-Capillary: 197 mg/dL — ABNORMAL HIGH (ref 65–99)
Glucose-Capillary: 108 mg/dL — ABNORMAL HIGH (ref 65–99)

## 2017-12-30 LAB — ECHOCARDIOGRAM COMPLETE
HEIGHTINCHES: 70 in
Weight: 3379.21 oz

## 2017-12-30 MED ORDER — METOLAZONE 2.5 MG PO TABS
2.5000 mg | ORAL_TABLET | Freq: Once | ORAL | Status: AC
  Administered 2017-12-30: 2.5 mg via ORAL
  Filled 2017-12-30: qty 1

## 2017-12-30 MED ORDER — FUROSEMIDE 10 MG/ML IJ SOLN
120.0000 mg | Freq: Three times a day (TID) | INTRAVENOUS | Status: DC
  Administered 2017-12-30 – 2018-01-02 (×11): 120 mg via INTRAVENOUS
  Filled 2017-12-30: qty 12
  Filled 2017-12-30 (×3): qty 10
  Filled 2017-12-30: qty 2
  Filled 2017-12-30: qty 12
  Filled 2017-12-30: qty 10
  Filled 2017-12-30: qty 12
  Filled 2017-12-30 (×3): qty 10
  Filled 2017-12-30: qty 12
  Filled 2017-12-30 (×2): qty 10

## 2017-12-30 NOTE — Progress Notes (Signed)
Heart Failure Navigator Consult Note  Presentation: Per Dr. Roma Schanz is a 77 year old with history of Diabetes, HTN, hyperlipidemia, hypothyroidism, obesity, tachy/brady s/p PPM, COPD, GERD, Af ib, CAD, and pulmonary hypertension.   She has been followed by Dr Vaughan Browner and was last seen 03/2017. At that time she had lost weight and was stable. She was on combination therapy macitentan and sildenafil. Weight at that time was 212 pounds.   Over the last month she developed fatigue. Saw PCP and she was anemic so she was referred to GI. Iron was started but she had little improvement.   She ran out of Macitentan about 10 days ago and did not call for a refill.   She has not been weighing at home and says she developed lower extremity edema over the last few weeks. She went back to her PCP on 3/28 and hgb had improved but due to worsening dyspnea she was sent to the ED for further evaluation.  Admitted with increased shortness of breath. CXR on admit with small left pleural effusion and mild atelectasis. Also had calcification LAD. Pertinent admission labs included: BNP 2089, creatinine 1.6, K 3.4, hgb 11.2, troponin 0.07. Diuresing with IV lasix. Weight down 9 pounds since admit  Past Medical History:  Diagnosis Date  . Atrial fibrillation ([place])    Status post pulmonary vein isolation 2009 at [facility]  . Atrial fibrillation ([place]) 12/18/2007   Annotation: refractory Qualifier: Diagnosis of  By: Doy Mince LPN, Megan    . CAD (coronary artery disease)    LHC 05/2007: pLAD 70-80%, pRCA 40%, EF 20-25%. LAD lesion treated medically.   . CHF (congestive heart failure) ([place])   . Chronic diastolic heart failure ([place])    Echo 06/2010: Moderate LVH, EF 55-65%, normal wall motion, mild MR, moderate to severe LAE, mild RAE.   Marland Kitchen Chronic obstructive pulmonary disease ([place])   . COPD (chronic obstructive pulmonary disease) ([place])   . Coronary artery disease 06/15/2012  . Crohn's disease ([place])    . Depression   . Diabetes mellitus ([place]) 06/22/2012  . Diabetes mellitus without complication ([place])   . Dysrhythmia    atrial fib  . GERD (gastroesophageal reflux disease)   . Gout   . Hyperlipidemia   . Hypothyroidism    treated  . Morbid obesity ([place])   . Morbid obesity with BMI of 40.0-44.9, adult ([place]) 06/22/2012  . Nonischemic cardiomyopathy ([place])    history of,  EF 20-25% at left heart cath in 06/2007; echo 2011 had normal LV function  . Obstructive sleep apnea    continuous positive airway pressure not using at present  . On home oxygen therapy    "2L at night" (10/03/2015)  . Osteoporosis   . Pacemaker    Medtronic  . Peripheral vascular disease (H[facility]) 15   rt arm clots  . Seasonal allergies   . Shortness of breath dyspnea    exersion  . Tachy-brady syndrome ([place])    status post implant of a medtronic dual-chamber pacemaker in 2001.  explanted in 2010 -- [place] pacemaker in 2010.    Social History   Socioeconomic History  . Marital status: Divorced    Spouse name: Not on file  . Number of children: Not on file  . Years of education: Not on file  . Highest education level: Not on file  Occupational History  . Occupation: retired  Scientific laboratory technician  . Financial resource strain: Not on file  .  Food insecurity:    Worry: Not on file    Inability: Not on file  . Transportation needs:    Medical: Not on file    Non-medical: Not on file  Tobacco Use  . Smoking status: Former Smoker    Packs/day: 3.00    Years: 32.00    Pack years: 96.00    Last attempt to quit: 09/30/1986    Years since quitting: 31.2  . Smokeless tobacco: Former Systems developer    Quit date: 08/15/1987  Substance and Sexual Activity  . Alcohol use: No    Alcohol/week: 0.0 oz  . Drug use: No  . Sexual activity: Never  Lifestyle  . Physical activity:    Days per week: Not on file    Minutes per session: Not on file  . Stress: Not on file  Relationships  . Social connections:     Talks on phone: Not on file    Gets together: Not on file    Attends religious service: Not on file    Active member of club or organization: Not on file    Attends meetings of clubs or organizations: Not on file    Relationship status: Not on file  Other Topics Concern  . Not on file  Social History Narrative   ** Merged History Encounter **        ECHO: new pending  BNP    Component Value Date/Time   BNP 2,089.7 (H) 12/25/2017 1737    ProBNP    Component Value Date/Time   PROBNP 114.0 (H) 09/02/2008 1810     Education Assessment and Provision:  Detailed education and instructions provided on heart failure disease management including the following:  Signs and symptoms of Heart Failure When to call the physician Importance of daily weights Low sodium diet Fluid restriction Medication management Anticipated future follow-up appointments  Patient education given on each of the above topics.  Patient acknowledges understanding and acceptance of all instructions.  Education Materials:  "Living Better With Heart Failure" Booklet, Daily Weight Tracker Tool.  I spoke with Ms. Ozimek regarding her HF and current hospitalization.  She tells me that she has known that she had HF for "a while".  She has a scale yet does not weight everyday.  I reviewed the importance of daily weights and when to contact the physician.  I reinforced a low sodium diet and high sodium foods to avoid.  She feels that she eats mostly low sodium.  She denies issues getting most meds except from the specialty pharmacy because they have trouble "reaching her".  She will continue to follow in the [facility] after discharge.   High Risk Criteria for Readmission and/or Poor Patient Outcomes:    EF <30%- Old echo--40-45% new pending  2 or more admissions in 6 months- 1/6 mo  Difficult social situation- Denies  Demonstrates medication noncompliance- denies--says she has a hard time "getting up with"  specialty pharmacy    Barriers of Care:  Knowledge and compliance  Discharge Planning:   Plans to return to home alone.  She may benefit from HF [employer] to reinforce compliance and education.

## 2017-12-30 NOTE — Progress Notes (Signed)
  Echocardiogram 2D Echocardiogram has been performed.  Merrie Roof F 12/30/2017, 12:13 PM

## 2017-12-30 NOTE — Progress Notes (Signed)
Patient resting in low bed with bed alarm on for safety, although she did not want her chair alarm on while sitting in it.

## 2017-12-30 NOTE — Progress Notes (Addendum)
[facility] Progress Note  Patient: [name] NLG:[id number]   PCP: [name], MD DOB: [birth year]   DOA: 12/25/2017   DOS: 12/30/2017   Date of Service: the patient was seen and examined on 12/30/2017  Subjective: Feeling better.  Still short of breath.  No chest pain no abdominal pain.  No nausea no vomiting.  No diarrhea.  Brief hospital course: Pt. with PMH of type II DM, HTN, HLD, OSA, CAD, PAF, CHF, hypothyroidism; admitted on 12/25/2017, presented with complaint of shortness of breath and swelling in the leg, was found to have acute on chronic diastolic CHF. Currently further plan is continue diuresis.  Assessment and Plan: 1.  Acute on chronic diastolic CHF. Bilateral pedal edema. Chronic pulmonary hypertension Presents with shortness of breath. Started on 40 mg IV Lasix. Also on Lopressor and Aldactone. Blood pressure was soft. Neurology was consulted due to no significant diuresis. Started on 80 mg twice daily of Lasix. Echocardiogram was performed still shows preserved EF, pulmonary hypertension, moderate TR, right-sided heart failure. Cardiology considering to increase Lasix today.  Low threshold from their perspective to start the patient on Primacor. May require right heart cath, initial plan was once she is euvolemic, will follow recommendation from cardiology. Currently has TED stockings, may benefit from bilateral Unna boot. On Macitentan 10 mg daily + sildenafil 20 mg three times a day for pulmonary hypertension.  2.  Type 2 diabetes mellitus. Uncontrolled. Continue sliding scale insulin. Continue Lantus.  Hemoglobin A1c.  3.  Paroxysmal A. fib. Rate controlled, continue Lopressor, continue Eliquis.  4.  Hypothyroidism. Continue Synthroid.  5.  Essential hypertension. Blood pressure soft. Losartan on hold. Continue metoprolol.  6.  Chronic kidney disease stage III. Renal function currently baseline. Monitor for worsening while the patient is  receiving aggressive IV diuresis.  7.  Hypokalemia. Potassium better.  Monitor.  8.  Dyslipidemia. Continue Lipitor.  Diet: Cardiac diet DVT Prophylaxis:on therapeutic anticoagulation.  Advance goals of care discussion: Full code  Family Communication: no family was present at bedside, at the time of interview.   Disposition:  Discharge to be determined  Consultants: cardiology Procedures: Echocardiogram   Antibiotics: Anti-infectives (From admission, onward)   None       Objective: Physical Exam: Vitals:   12/30/17 0510 12/30/17 0826 12/30/17 0928 12/30/17 1228  BP: 103/62  102/73 101/64  Pulse: 78  60 77  Resp: 20   18  Temp: (!) 97.4 F (36.3 C)   97.7 F (36.5 C)  TempSrc: Oral   Oral  SpO2: 100% (!) 87% 100% 99%  Weight: 95.8 kg (211 lb 3.2 oz)     Height:        Intake/Output Summary (Last 24 hours) at 12/30/2017 1649 Last data filed at 12/30/2017 1600 Gross per 24 hour  Intake 1010 ml  Output 1925 ml  Net -915 ml   Filed Weights   12/28/17 0510 12/29/17 0451 12/30/17 0510  Weight: 94.8 kg (208 lb 14.4 oz) 94.3 kg (208 lb) 95.8 kg (211 lb 3.2 oz)   General: Alert, Awake and Oriented to Time, Place and Person. Appear in moderate distress, affect blunted, avoids eye contact. Eyes: PERRL Conjunctiva normal ENT: Oral Mucosa clear moist. Neck: difficult to assess  JVD, no Abnormal Mass Or lumps Cardiovascular: S1 and S2 Present, aortic systolic Murmur, Peripheral Pulses Present Respiratory: normal respiratory effort, Bilateral Air entry equal and Decreased, no use of accessory muscle, no Crackles, no wheezes Abdomen: Bowel Sound present, Soft and no  tenderness, no hernia Skin: no redness, no Rash, no induration Extremities: bilateral  Pedal edema, no calf tenderness Neurologic: Grossly no focal neuro deficit. Bilaterally Equal motor strength  Data Reviewed: CBC: Recent Labs  Lab 12/25/17 1737  WBC 8.1  HGB 11.2*  HCT 39.2  MCV 90.1  PLT 093    Basic Metabolic Panel: Recent Labs  Lab 12/25/17 1737 12/27/17 0249 12/28/17 0535 12/29/17 0240 12/30/17 0824  NA 138 137 137 136 134*  K 3.4* 3.6 3.6 3.5 4.5  CL 98* 98* 96* 95* 96*  CO2 28 28 28 27 23   GLUCOSE 106* 134* 124* 124* 123*  BUN 43* 41* 43* 40* 46*  CREATININE 1.67* 1.63* 1.65* 1.47* 1.35*  CALCIUM 8.0* 7.8* 8.1* 8.0* 8.2*    Liver Function Tests: No results for input(s): AST, ALT, ALKPHOS, BILITOT, PROT, ALBUMIN in the last 168 hours. No results for input(s): LIPASE, AMYLASE in the last 168 hours. No results for input(s): AMMONIA in the last 168 hours. Coagulation Profile: No results for input(s): INR, PROTIME in the last 168 hours. Cardiac Enzymes: No results for input(s): CKTOTAL, CKMB, CKMBINDEX, TROPONINI in the last 168 hours. BNP (last 3 results) No results for input(s): PROBNP in the last 8760 hours. CBG: Recent Labs  Lab 12/29/17 1627 12/29/17 2105 12/30/17 0801 12/30/17 1208 12/30/17 1636  GLUCAP 161* 141* 102* 119* 197*   Studies: No results found.  Scheduled Meds: . apixaban  5 mg Oral BID  . atorvastatin  80 mg Oral QHS  . fluticasone furoate-vilanterol  1 puff Inhalation Daily  . folic acid  1 mg Oral Daily  . insulin aspart  0-9 Units Subcutaneous TID WC  . insulin glargine  5 Units Subcutaneous Daily  . levothyroxine  100 mcg Oral QAC breakfast  . macitentan  10 mg Oral Daily  . metoprolol tartrate  50 mg Oral BID  . multivitamin  1 tablet Oral Daily  . multivitamin with minerals  1 tablet Oral Daily  . pantoprazole  40 mg Oral Daily  . ranolazine  500 mg Oral BID  . sildenafil  40 mg Oral TID  . spironolactone  12.5 mg Oral Daily  . sulfaSALAzine  1,000 mg Oral BID WC  . tiotropium  18 mcg Inhalation Daily   Continuous Infusions: . furosemide 120 mg (12/30/17 1546)   PRN Meds: albuterol, colchicine  Time spent: 35 minutes  Author: Berle Mull, MD [facility] Pager: [phone] 12/30/2017 4:49 PM  If  7PM-7AM, please contact night-coverage at [email or web address], password [facility]

## 2017-12-30 NOTE — Progress Notes (Addendum)
Advanced Heart Failure Rounding Note  PCP-Cardiologist: No primary care provider on file.   Subjective:    Yesterday diuresed with IV lasix. Diuresis sluggish. Remains bloated.  Weight up 3 pounds. I/O not accurate with incontinence.   Denies SOB.   Objective:   Weight Range: 211 lb 3.2 oz (95.8 kg) Body mass index is 30.3 kg/m.   Vital Signs:   Temp:  [97.4 F (36.3 C)-98.4 F (36.9 C)] 97.4 F (36.3 C) (04/02 0510) Pulse Rate:  [68-78] 78 (04/02 0510) Resp:  [20] 20 (04/02 0510) BP: (103-110)/(60-71) 103/62 (04/02 0510) SpO2:  [87 %-100 %] 87 % (04/02 0826) Weight:  [211 lb 3.2 oz (95.8 kg)] 211 lb 3.2 oz (95.8 kg) (04/02 0510) Last BM Date: 12/29/17  Weight change: Filed Weights   12/28/17 0510 12/29/17 0451 12/30/17 0510  Weight: 208 lb 14.4 oz (94.8 kg) 208 lb (94.3 kg) 211 lb 3.2 oz (95.8 kg)    Intake/Output:   Intake/Output Summary (Last 24 hours) at 12/30/2017 0900 Last data filed at 12/30/2017 3532 Gross per 24 hour  Intake 1060 ml  Output 1400 ml  Net -340 ml      Physical Exam    General:  Elderly. No resp difficulty. Sitting in the chair.  HEENT: Normal anicteric  Neck: Supple. JVP to jaw with prominent CV waves.  . Carotids 2+ bilat; no bruits. No lymphadenopathy or thyromegaly appreciated. Cor: PMI nondisplaced. Regular rate & rhythm. 2/6 TR + RV lift Lungs: Clear Abdomen: Soft, nontender, nondistended. No hepatosplenomegaly. No bruits or masses. Good bowel sounds. Extremities: No cyanosis, clubbing, rash, R and LLE 2+ edema. Ted hose in place.  Neuro: Alert & orientedx3, cranial nerves grossly intact. moves all 4 extremities w/o difficulty. Affect pleasant   Telemetry   V paced 70s  Personally reviewed   EKG    N/A   Labs    CBC No results for input(s): WBC, NEUTROABS, HGB, HCT, MCV, PLT in the last 72 hours. Basic Metabolic Panel Recent Labs    12/28/17 0535 12/29/17 0240  NA 137 136  K 3.6 3.5  CL 96* 95*  CO2 28 27    GLUCOSE 124* 124*  BUN 43* 40*  CREATININE 1.65* 1.47*  CALCIUM 8.1* 8.0*   Liver Function Tests No results for input(s): AST, ALT, ALKPHOS, BILITOT, PROT, ALBUMIN in the last 72 hours. No results for input(s): LIPASE, AMYLASE in the last 72 hours. Cardiac Enzymes No results for input(s): CKTOTAL, CKMB, CKMBINDEX, TROPONINI in the last 72 hours.  BNP: BNP (last 3 results) Recent Labs    12/25/17 1737  BNP 2,089.7*    ProBNP (last 3 results) No results for input(s): PROBNP in the last 8760 hours.   D-Dimer No results for input(s): DDIMER in the last 72 hours. Hemoglobin A1C No results for input(s): HGBA1C in the last 72 hours. Fasting Lipid Panel No results for input(s): CHOL, HDL, LDLCALC, TRIG, CHOLHDL, LDLDIRECT in the last 72 hours. Thyroid Function Tests No results for input(s): TSH, T4TOTAL, T3FREE, THYROIDAB in the last 72 hours.  Invalid input(s): FREET3  Other results:   Imaging     No results found.   Medications:     Scheduled Medications: . apixaban  5 mg Oral BID  . atorvastatin  80 mg Oral QHS  . fluticasone furoate-vilanterol  1 puff Inhalation Daily  . folic acid  1 mg Oral Daily  . furosemide  80 mg Intravenous Q12H  . insulin aspart  0-9 Units  Subcutaneous TID WC  . insulin glargine  5 Units Subcutaneous Daily  . levothyroxine  100 mcg Oral QAC breakfast  . macitentan  10 mg Oral Daily  . metoprolol tartrate  50 mg Oral BID  . multivitamin  1 tablet Oral Daily  . multivitamin with minerals  1 tablet Oral Daily  . pantoprazole  40 mg Oral Daily  . ranolazine  500 mg Oral BID  . sildenafil  40 mg Oral TID  . spironolactone  12.5 mg Oral Daily  . sulfaSALAzine  1,000 mg Oral BID WC  . tiotropium  18 mcg Inhalation Daily     Infusions:   PRN Medications:  albuterol, colchicine    Patient Profile  [name] is a 77 year old with history of Diabetes, HTN, hyperlipidemia, hypothyroidism, obesity, tachy/brady s/p PPM, COPD,  GERD, Af ib, CAD, and pulmonary hypertension.   Admitted with increased dyspnea.    Assessment/Plan   1. A/C Combined Systolic/Diastolic Heart Failure  -Volume status remains elevated. Continue 80 mg IV lasix twice a day and give 2.5 mg metolazone now. .  - Continue 40 meq K daily.  - ECHO results pending.   2. 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 -Out of macitentan about 10 days. Restarted on admit.  -On combination therapy. Macitentan 10 mg daily + sildenafil 20 mg three times a day. On chronic oxygen.  - Will need RHC to evaluate hemodynamics once diuresed. Set up RHC 24-48 hours.  I am concerned with ongoing fatigue she may have low output.   3. COPD On chronic oxgeyn   4. [name]- with medtronic PPM  5. Permanent A fib  Vpaced. On eliquis.   6. CAD  Cath 2013 with LAD 70-75% and ostial 95% D2.  - No s/s ischemia  -On statin and eliquis.   7. DMii -On insulin per Primary   8. CKD Stage III -Creatinine 1.6>1.47  - BMET pending.   Consult cardiac rehab.   Medication concerns reviewed with patient and pharmacy team. Barriers identified: HF pharmacist verified opsumit was re-approved.  Length of Stay: 2  [name] Clegg, NP  12/30/2017, 9:00 AM  Advanced Heart Failure Team Pager [phone] (M-F; 7a - 4p)  Please contact [facility] for night-coverage after hours (4p -7a ) and weekends on [email or web address]  Patient seen and examined with [name] Grinder, NP. We discussed all aspects of the encounter. I agree with the assessment and plan as stated above.   77 y/o woman with PAH and R-sided HF admitted with worsening volume overloaded. Echo reviewed personally and shows LVEF 55-60%. RV is dilated with mildly reduced function. (improved from previous) There is septal flattening and severe biatrial enlargement suggestive of restrictive CM.   Will increase IV lasix. Low threshold to add milrinone and proceed with RHC. Watch renal function slowly.   [name] Bickers,  MD  3:39 PM

## 2017-12-30 NOTE — Progress Notes (Signed)
PT Cancellation Note  Patient Details Name: AARNA MIHALKO MRN: [id number] DOB: 30-Apr-1941   Cancelled Treatment:    Reason Eval/Treat Not Completed: Other (comment)(Pt off unit to echo.  will continue efforts per POC.  )   Aaisha Sliter Eli Hose 12/30/2017, 11:42 AM Governor Rooks, PTA pager [phone]

## 2017-12-31 ENCOUNTER — Inpatient Hospital Stay: Payer: Self-pay

## 2017-12-31 DIAGNOSIS — K5010 Crohn's disease of large intestine without complications: Secondary | ICD-10-CM

## 2017-12-31 DIAGNOSIS — E1121 Type 2 diabetes mellitus with diabetic nephropathy: Secondary | ICD-10-CM

## 2017-12-31 DIAGNOSIS — I5031 Acute diastolic (congestive) heart failure: Secondary | ICD-10-CM

## 2017-12-31 DIAGNOSIS — Z950 Presence of cardiac pacemaker: Secondary | ICD-10-CM

## 2017-12-31 DIAGNOSIS — Z7901 Long term (current) use of anticoagulants: Secondary | ICD-10-CM

## 2017-12-31 DIAGNOSIS — I50810 Right heart failure, unspecified: Secondary | ICD-10-CM

## 2017-12-31 DIAGNOSIS — Z794 Long term (current) use of insulin: Secondary | ICD-10-CM

## 2017-12-31 DIAGNOSIS — M1A9XX0 Chronic gout, unspecified, without tophus (tophi): Secondary | ICD-10-CM

## 2017-12-31 DIAGNOSIS — I2510 Atherosclerotic heart disease of native coronary artery without angina pectoris: Secondary | ICD-10-CM

## 2017-12-31 DIAGNOSIS — J438 Other emphysema: Secondary | ICD-10-CM

## 2017-12-31 LAB — CBC
HCT: 38 % (ref 36.0–46.0)
Hemoglobin: 11.1 g/dL — ABNORMAL LOW (ref 12.0–15.0)
MCH: 26.3 pg (ref 26.0–34.0)
MCHC: 29.2 g/dL — ABNORMAL LOW (ref 30.0–36.0)
MCV: 90 fL (ref 78.0–100.0)
Platelets: 413 10*3/uL — ABNORMAL HIGH (ref 150–400)
RBC: 4.22 MIL/uL (ref 3.87–5.11)
RDW: 28.4 % — ABNORMAL HIGH (ref 11.5–15.5)
WBC: 5.9 10*3/uL (ref 4.0–10.5)

## 2017-12-31 LAB — GLUCOSE, CAPILLARY
GLUCOSE-CAPILLARY: 172 mg/dL — AB (ref 65–99)
Glucose-Capillary: 120 mg/dL — ABNORMAL HIGH (ref 65–99)
Glucose-Capillary: 127 mg/dL — ABNORMAL HIGH (ref 65–99)
Glucose-Capillary: 129 mg/dL — ABNORMAL HIGH (ref 65–99)

## 2017-12-31 LAB — HEMOGLOBIN A1C
Hgb A1c MFr Bld: 5.8 % — ABNORMAL HIGH (ref 4.8–5.6)
MEAN PLASMA GLUCOSE: 119.76 mg/dL

## 2017-12-31 LAB — IRON AND TIBC
Iron: 44 ug/dL (ref 28–170)
Saturation Ratios: 15 % (ref 10.4–31.8)
TIBC: 300 ug/dL (ref 250–450)
UIBC: 256 ug/dL

## 2017-12-31 LAB — BASIC METABOLIC PANEL WITH GFR
Anion gap: 15 (ref 5–15)
BUN: 43 mg/dL — ABNORMAL HIGH (ref 6–20)
CO2: 30 mmol/L (ref 22–32)
Calcium: 8.4 mg/dL — ABNORMAL LOW (ref 8.9–10.3)
Chloride: 89 mmol/L — ABNORMAL LOW (ref 101–111)
Creatinine, Ser: 1.45 mg/dL — ABNORMAL HIGH (ref 0.44–1.00)
GFR calc Af Amer: 39 mL/min — ABNORMAL LOW
GFR calc non Af Amer: 34 mL/min — ABNORMAL LOW
Glucose, Bld: 129 mg/dL — ABNORMAL HIGH (ref 65–99)
Potassium: 3.8 mmol/L (ref 3.5–5.1)
Sodium: 134 mmol/L — ABNORMAL LOW (ref 135–145)

## 2017-12-31 LAB — RETICULOCYTES
RBC.: 4.17 MIL/uL (ref 3.87–5.11)
RETIC CT PCT: 1.9 % (ref 0.4–3.1)
Retic Count, Absolute: 79.2 10*3/uL (ref 19.0–186.0)

## 2017-12-31 LAB — MRSA PCR SCREENING: MRSA BY PCR: NEGATIVE

## 2017-12-31 LAB — FERRITIN: FERRITIN: 236 ng/mL (ref 11–307)

## 2017-12-31 LAB — MAGNESIUM: Magnesium: 2.1 mg/dL (ref 1.7–2.4)

## 2017-12-31 MED ORDER — INSULIN ASPART 100 UNIT/ML ~~LOC~~ SOLN
0.0000 [IU] | Freq: Three times a day (TID) | SUBCUTANEOUS | Status: DC
  Administered 2017-12-31: 2 [IU] via SUBCUTANEOUS
  Administered 2017-12-31: 1 [IU] via SUBCUTANEOUS
  Administered 2018-01-01 – 2018-01-02 (×3): 2 [IU] via SUBCUTANEOUS
  Administered 2018-01-03: 1 [IU] via SUBCUTANEOUS
  Administered 2018-01-03: 2 [IU] via SUBCUTANEOUS
  Administered 2018-01-03 – 2018-01-04 (×2): 1 [IU] via SUBCUTANEOUS
  Administered 2018-01-05: 2 [IU] via SUBCUTANEOUS
  Administered 2018-01-05 – 2018-01-06 (×3): 1 [IU] via SUBCUTANEOUS
  Administered 2018-01-06: 3 [IU] via SUBCUTANEOUS
  Administered 2018-01-07 (×2): 2 [IU] via SUBCUTANEOUS
  Administered 2018-01-08: 1 [IU] via SUBCUTANEOUS
  Administered 2018-01-08: 2 [IU] via SUBCUTANEOUS

## 2017-12-31 MED ORDER — LEVOTHYROXINE SODIUM 100 MCG PO TABS
100.0000 ug | ORAL_TABLET | Freq: Every day | ORAL | Status: DC
  Administered 2017-12-31 – 2018-01-08 (×8): 100 ug via ORAL
  Filled 2017-12-31 (×8): qty 1

## 2017-12-31 MED ORDER — METOLAZONE 2.5 MG PO TABS
2.5000 mg | ORAL_TABLET | Freq: Once | ORAL | Status: AC
  Administered 2017-12-31: 2.5 mg via ORAL
  Filled 2017-12-31: qty 1

## 2017-12-31 MED ORDER — POTASSIUM CHLORIDE CRYS ER 20 MEQ PO TBCR
40.0000 meq | EXTENDED_RELEASE_TABLET | Freq: Once | ORAL | Status: AC
Start: 2017-12-31 — End: 2017-12-31
  Administered 2017-12-31: 40 meq via ORAL
  Filled 2017-12-31: qty 2

## 2017-12-31 NOTE — Progress Notes (Signed)
Pt refusing bed alarm. Will continue to monitor. 

## 2017-12-31 NOTE — Progress Notes (Signed)
Physical Therapy Treatment Patient Details Name: [name] MRN: [id number] DOB: 1941-04-18 Today's Date: 12/31/2017    History of Present Illness Pt is a 77 y.o. female admitted 12/25/17 with weakness and LE edema; worked up for CHF exacerbation. PMH includes DMII, HTN, a-fib, OSA, CAD, CHF, CKD III, gout, pacemaker.    PT Comments    Pt performed increased activity and agreeable to gait training with Rollator.  Pt only able to perform short distances due to pain in B feet.  Plan updated for HHPT at d/c.  Pt is agreeable to ambulate 2x daily with use of rollator for duration of stay.  Informed supervising PT of need for update recs to solidify HHPT for d/c plan.      Follow Up Recommendations  Home health PT     Equipment Recommendations  (Pt reports she has all necessary DME)    Recommendations for Other Services       Precautions / Restrictions Precautions Precautions: Fall Restrictions Weight Bearing Restrictions: No    Mobility  Bed Mobility               General bed mobility comments: Received sitting in chair  Transfers Overall transfer level: Needs assistance Equipment used: 4-wheeled walker(cues for locking brakes.  ) Transfers: Sit to/from Stand Sit to Stand: Min guard         General transfer comment: Cues for hand placement to and from seated surface.  Pt required cues for eccentric loading.  During second trial patient presents with improved controlled descent.   Ambulation/Gait Ambulation/Gait assistance: Min guard Ambulation Distance (Feet): 25 Feet Assistive device: 4-wheeled walker Gait Pattern/deviations: Step-through pattern;Trunk flexed;Shuffle     General Gait Details: Cues for upper trunk control and safety with rollator device.  Pt with limited distance due to fatigue.     Stairs            Wheelchair Mobility    Modified Rankin (Stroke Patients Only)       Balance     Sitting balance-Leahy Scale: Good        Standing balance-Leahy Scale: Fair                              Cognition Arousal/Alertness: Awake/alert Behavior During Therapy: Flat affect Overall Cognitive Status: Within Functional Limits for tasks assessed                                        Exercises      General Comments        Pertinent Vitals/Pain Pain Assessment: Faces Faces Pain Scale: Hurts even more Pain Location: Bilateral feet Pain Descriptors / Indicators: Sore Pain Intervention(s): Monitored during session;Repositioned    Home Living                      Prior Function            PT Goals (current goals can now be found in the care plan section) Acute Rehab PT Goals Patient Stated Goal: Less pain Potential to Achieve Goals: Good Progress towards PT goals: Progressing toward goals    Frequency    Min 3X/week      PT Plan Current plan remains appropriate    Co-evaluation              AM-PAC PT "  6 Clicks" Daily Activity  Outcome Measure  Difficulty turning over in bed (including adjusting bedclothes, sheets and blankets)?: A Little Difficulty moving from lying on back to sitting on the side of the bed? : A Little Difficulty sitting down on and standing up from a chair with arms (e.g., wheelchair, bedside commode, etc,.)?: Unable Help needed moving to and from a bed to chair (including a wheelchair)?: A Little Help needed walking in hospital room?: A Little Help needed climbing 3-5 steps with a railing? : A Lot 6 Click Score: 15    End of Session Equipment Utilized During Treatment: Gait belt Activity Tolerance: Patient limited by pain Patient left: in chair;with call bell/phone within reach Nurse Communication: Mobility status PT Visit Diagnosis: Other abnormalities of gait and mobility (R26.89);Pain Pain - part of body: Leg;Ankle and joints of foot     Time: 6333-5456 PT Time Calculation (min) (ACUTE ONLY): 16 min  Charges:  $Gait  Training: 8-22 mins                    G Codes:       Governor Rooks, PTA pager [phone]    Cristela Blue 12/31/2017, 4:51 PM

## 2017-12-31 NOTE — Progress Notes (Addendum)
Advanced Heart Failure Rounding Note  PCP-Cardiologist: No primary care provider on file.   Subjective:    Improved urine output (-1L) with increased lasix + metolazone. Creatinine 1.35 > 1.45. Weight pending.   No CP, SOB, dizziness, or bleeding. Still feels like she has a lot of extra fluid.   Echo 12/30/17:  - Left ventricle: Septal flattening indicative of elevated PA   pressures. Wall thickness was increased in a pattern of moderate   LVH. Systolic function was normal. The estimated ejection   fraction was in the range of 55% to 60%. The study is not   technically sufficient to allow evaluation of LV diastolic   function. - Mitral valve: There was mild regurgitation. - Left atrium: The atrium was severely dilated. - Right ventricle: The cavity size was moderately dilated. - Right atrium: The atrium was severely dilated. - Atrial septum: No defect or patent foramen ovale was identified. - Tricuspid valve: There was moderate regurgitation. - Pulmonary arteries: PA peak pressure: 58 mm Hg (S).  Objective:   Weight Range: 211 lb 3.2 oz (95.8 kg) Body mass index is 30.3 kg/m.   Vital Signs:   Temp:  [97.6 F (36.4 C)-97.8 F (36.6 C)] 97.8 F (36.6 C) (04/03 0708) Pulse Rate:  [60-77] 72 (04/03 0708) Resp:  [18] 18 (04/02 1228) BP: (101-126)/(52-73) 116/72 (04/03 0708) SpO2:  [83 %-100 %] 92 % (04/03 0747) Last BM Date: 12/30/17  Weight change: Filed Weights   12/28/17 0510 12/29/17 0451 12/30/17 0510  Weight: 208 lb 14.4 oz (94.8 kg) 208 lb (94.3 kg) 211 lb 3.2 oz (95.8 kg)    Intake/Output:   Intake/Output Summary (Last 24 hours) at 12/31/2017 0827 Last data filed at 12/31/2017 0537 Gross per 24 hour  Intake 1432 ml  Output 2200 ml  Net -768 ml      Physical Exam    General: Elderly. No resp difficulty. Sitting in chair HEENT: Normal Neck: Supple. JVP to jaw with prominent CV waves. Carotids 2+ bilat; no bruits. No thyromegaly or nodule noted. Cor:  PMI nondisplaced. RRR, 2/6 TR + RV lift Lungs: CTAB, normal effort. Abdomen: Soft, non-tender, non-distended, no HSM. No bruits or masses. +BS  Extremities: No cyanosis, clubbing, or rash. R and LLE 2+ edema. BLE TED hose on, warm Neuro: Alert & orientedx3, cranial nerves grossly intact. moves all 4 extremities w/o difficulty. Affect pleasant  Telemetry   A fib 70-80s with occasional PVC's. Rare v-pacing. Personally reviewed.   EKG    No new tracings.   Labs    CBC Recent Labs    12/31/17 0526  WBC 5.9  HGB 11.1*  HCT 38.0  MCV 90.0  PLT 063*   Basic Metabolic Panel Recent Labs    12/30/17 0824 12/31/17 0526  NA 134* 134*  K 4.5 3.8  CL 96* 89*  CO2 23 30  GLUCOSE 123* 129*  BUN 46* 43*  CREATININE 1.35* 1.45*  CALCIUM 8.2* 8.4*  MG  --  2.1   Liver Function Tests No results for input(s): AST, ALT, ALKPHOS, BILITOT, PROT, ALBUMIN in the last 72 hours. No results for input(s): LIPASE, AMYLASE in the last 72 hours. Cardiac Enzymes No results for input(s): CKTOTAL, CKMB, CKMBINDEX, TROPONINI in the last 72 hours.  BNP: BNP (last 3 results) Recent Labs    12/25/17 1737  BNP 2,089.7*    ProBNP (last 3 results) No results for input(s): PROBNP in the last 8760 hours.   D-Dimer No results  for input(s): DDIMER in the last 72 hours. Hemoglobin A1C Recent Labs    12/31/17 0526  HGBA1C 5.8*   Fasting Lipid Panel No results for input(s): CHOL, HDL, LDLCALC, TRIG, CHOLHDL, LDLDIRECT in the last 72 hours. Thyroid Function Tests No results for input(s): TSH, T4TOTAL, T3FREE, THYROIDAB in the last 72 hours.  Invalid input(s): FREET3  Other results:   Imaging    No results found.   Medications:     Scheduled Medications: . apixaban  5 mg Oral BID  . atorvastatin  80 mg Oral QHS  . fluticasone furoate-vilanterol  1 puff Inhalation Daily  . folic acid  1 mg Oral Daily  . insulin aspart  0-9 Units Subcutaneous TID WC  . insulin glargine  5 Units  Subcutaneous Daily  . levothyroxine  100 mcg Oral QAC breakfast  . macitentan  10 mg Oral Daily  . metoprolol tartrate  50 mg Oral BID  . multivitamin  1 tablet Oral Daily  . multivitamin with minerals  1 tablet Oral Daily  . pantoprazole  40 mg Oral Daily  . ranolazine  500 mg Oral BID  . sildenafil  40 mg Oral TID  . spironolactone  12.5 mg Oral Daily  . sulfaSALAzine  1,000 mg Oral BID WC  . tiotropium  18 mcg Inhalation Daily    Infusions: . furosemide Stopped (12/31/17 0011)    PRN Medications: albuterol, colchicine    Patient Profile  [name] is a 77 year old with history of Diabetes, HTN, hyperlipidemia, hypothyroidism, obesity, tachy/brady s/p PPM, COPD, GERD, Af ib, CAD, and pulmonary hypertension.   Admitted with increased dyspnea.   Assessment/Plan   1. A/C Combined Systolic/Diastolic Heart Failure  - Echo 12/30/17: EF 55-60%, mild MR, severe biatrial enlargement, RV mod dilated, mod TR, PA peak pressure: 58 (Previously EF 40-45% on Echo 03/2017) - Volume status remains elevated despite increased diuretics.  - Continue 120 mg IV lasix TID. Will give another dose of 2.5 mg metolazone today - Continue 40 meq K daily.  - Continue spiro 12.5 mg daily - RHC ?tomorrow - Will discuss with Dr Haroldine Laws   2. 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 -Out of macitentan about 10 days. Restarted on admit.  -On combination therapy. Macitentan 10 mg daily + sildenafil 20 mg three times a day. On chronic oxygen.  - Will need RHC to evaluate hemodynamics once diuresed. ?tomorrow. Will discuss with Dr Haroldine Laws.  I am concerned with ongoing fatigue she may have low output.   3. COPD On chronic oxygen. No change.   4. [name]- with [employer]tronic PPM - A fib 70-80s on tele  5. Permanent A fib  - Rate controlled. Continue eliquis  6. CAD  Cath 2013 with LAD 70-75% and ostial 95% D2.  - No s/s ischemia - On statin and eliquis.   7. DM2 - On insulin per Primary.  No change.   8. CKD Stage III - Creatinine 1.35 > 1.45  - BMET daily  Cardiac rehab csonulted. Not yet seen.   Medication concerns reviewed with patient and pharmacy team. Barriers identified: HF pharmacist verified opsumit was re-approved.  Length of Stay: [place], NP  12/31/2017, 8:27 AM  Advanced Heart Failure Team Pager [phone] (M-F; 7a - 4p)  Please contact [facility] for night-coverage after hours (4p -7a ) and weekends on [email or web address]  Patient seen and examined with the above-signed Advanced Practice Provider and/or Housestaff. I personally reviewed  laboratory data, imaging studies and relevant notes. I independently examined the patient and formulated the important aspects of the plan. I have edited the note to reflect any of my changes or salient points. I have personally discussed the plan with the patient and/or family.  Urine output remains sluggish despite increasing doses of lasix. CVP markedly elevated and still with 3+ edema. Creatinine slightly worse. I worry she has progressive RV failure. Will place PICC and move to SDU to follow CVP and co-ox. May need brief inotropic support. Continue IV lasix.   Glori Bickers, MD  8:23 PM

## 2017-12-31 NOTE — Progress Notes (Signed)
Per vascular team, pt will not be able to get triple lumen PICC line placed until tomorrow morning 4/4. Call received at 2132.  Jacqlyn Larsen, RN

## 2017-12-31 NOTE — Progress Notes (Signed)
Came to access pt at 1000. She is sleepy/tired. She declines walking today. We discussed importance of mobility and I encouraged her to walk with PT later. Will f/u as x2 as time allows. Yves Dill CES, ACSM 2:29 PM 12/31/2017

## 2017-12-31 NOTE — Progress Notes (Signed)
PROGRESS NOTE    [name]   BSJ:[id number]  DOB: 1941-04-04  DOA: 12/25/2017 PCP: [name], MD   Brief Narrative:  [name]  is a 77 y.o. female, w Dm2, Hypertension, Hyperlipidemia, Hypothyroidism, OSA, CAD, Pafib, CHF (EF 55%), PAH, RV failure sent to the ED for worsening dyspnea. Addmited for acute on chronic combined heart failure     Subjective: She has no dyspnea at rest. No other complaints.     Assessment & Plan:   Principal Problem:   Acute on chronic diastolic congestive heart failure, PAH and RV failure - management per heart failure team - ECHO below shows EF 50-55% and severe dilated atria (left and right) and moderately dialted RV - ran out of Macitentan which has been restarted- cont Sildenafil as well  Active Problems:   Atrial fibrillation- paroxysmal - cont Eliquis, Metoprolol    COPD with emphysema  - chronically on O2- stable     CARDIAC PACEMAKER IN SITU-MEDTRONIC ADAPTA L   Coronary artery disease - cont Statin    CKD (chronic kidney disease) stage 3, GFR 30-59 ml/min  - follow with diuresis   DM2 -   A1c 5.8   - on Lantus 5 U (uses 10 at home) and SSI  Gout - PRN Colchicine- d/c order while in hospital and follow clinically   Mild anemia - follow- on folic acid- check anemia panel  Crohn's disease - cont Sulfasalazine  DVT prophylaxis: Eliquis Code Status: Full code Family Communication:  Disposition Plan: follow on telemetry Consultants:   Heart failure team Procedures:  2 D ECHO Study Conclusions  - Left ventricle: Septal flattening indicative of elevated PA   pressures. Wall thickness was increased in a pattern of moderate   LVH. Systolic function was normal. The estimated ejection   fraction was in the range of 55% to 60%. The study is not   technically sufficient to allow evaluation of LV diastolic   function. - Mitral valve: There was mild regurgitation. - Left atrium: The atrium was severely  dilated. - Right ventricle: The cavity size was moderately dilated. - Right atrium: The atrium was severely dilated. - Atrial septum: No defect or patent foramen ovale was identified. - Tricuspid valve: There was moderate regurgitation. - Pulmonary arteries: PA peak pressure: 58 mm Hg (S).  Antimicrobials:  Anti-infectives (From admission, onward)   None       Objective: Vitals:   12/31/17 0747 12/31/17 0829 12/31/17 0858 12/31/17 1221  BP:   (!) 99/51 103/66  Pulse:   72 70  Resp:    20  Temp:    98.2 F (36.8 C)  TempSrc:    Oral  SpO2: 92%   93%  Weight:  94.3 kg (207 lb 12.8 oz)    Height:        Intake/Output Summary (Last 24 hours) at 12/31/2017 1642 Last data filed at 12/31/2017 1345 Gross per 24 hour  Intake 1842 ml  Output 2300 ml  Net -458 ml   Filed Weights   12/29/17 0451 12/30/17 0510 12/31/17 0829  Weight: 94.3 kg (208 lb) 95.8 kg (211 lb 3.2 oz) 94.3 kg (207 lb 12.8 oz)    Examination: General exam: Appears comfortable  HEENT: PERRLA, oral mucosa moist, no sclera icterus or thrush Respiratory system: Clear to auscultation. Respiratory effort normal. Cardiovascular system: S1 & S2 heard, RRR.  No murmurs  Gastrointestinal system: Abdomen soft, non-tender, nondistended. Normal bowel sound. No organomegaly Central nervous system: Alert  and oriented. No focal neurological deficits. Extremities: No cyanosis, clubbing- 2 +  pedal edema Skin: No rashes or ulcers Psychiatry:  Mood & affect appropriate.     Data Reviewed: I have personally reviewed following labs and imaging studies  CBC: Recent Labs  Lab 12/25/17 1737 12/31/17 0526  WBC 8.1 5.9  HGB 11.2* 11.1*  HCT 39.2 38.0  MCV 90.1 90.0  PLT 383 096*   Basic Metabolic Panel: Recent Labs  Lab 12/27/17 0249 12/28/17 0535 12/29/17 0240 12/30/17 0824 12/31/17 0526  NA 137 137 136 134* 134*  K 3.6 3.6 3.5 4.5 3.8  CL 98* 96* 95* 96* 89*  CO2 28 28 27 23 30   GLUCOSE 134* 124* 124* 123*  129*  BUN 41* 43* 40* 46* 43*  CREATININE 1.63* 1.65* 1.47* 1.35* 1.45*  CALCIUM 7.8* 8.1* 8.0* 8.2* 8.4*  MG  --   --   --   --  2.1   GFR: Estimated Creatinine Clearance: 40.4 mL/min (A) (by C-G formula based on SCr of 1.45 mg/dL (H)). Liver Function Tests: No results for input(s): AST, ALT, ALKPHOS, BILITOT, PROT, ALBUMIN in the last 168 hours. No results for input(s): LIPASE, AMYLASE in the last 168 hours. No results for input(s): AMMONIA in the last 168 hours. Coagulation Profile: No results for input(s): INR, PROTIME in the last 168 hours. Cardiac Enzymes: No results for input(s): CKTOTAL, CKMB, CKMBINDEX, TROPONINI in the last 168 hours. BNP (last 3 results) No results for input(s): PROBNP in the last 8760 hours. HbA1C: Recent Labs    12/31/17 0526  HGBA1C 5.8*   CBG: Recent Labs  Lab 12/30/17 1208 12/30/17 1636 12/30/17 2134 12/31/17 0736 12/31/17 1124  GLUCAP 119* 197* 108* 120* 172*   Lipid Profile: No results for input(s): CHOL, HDL, LDLCALC, TRIG, CHOLHDL, LDLDIRECT in the last 72 hours. Thyroid Function Tests: No results for input(s): TSH, T4TOTAL, FREET4, T3FREE, THYROIDAB in the last 72 hours. Anemia Panel: No results for input(s): VITAMINB12, FOLATE, FERRITIN, TIBC, IRON, RETICCTPCT in the last 72 hours. Urine analysis:    Component Value Date/Time   COLORURINE YELLOW 06/01/2017 1100   APPEARANCEUR CLEAR 06/01/2017 1100   LABSPEC 1.015 06/01/2017 1100   PHURINE 5.0 06/01/2017 1100   GLUCOSEU NEGATIVE 06/01/2017 1100   GLUCOSEU NEGATIVE 12/08/2007 1621   HGBUR NEGATIVE 06/01/2017 1100   BILIRUBINUR NEGATIVE 06/01/2017 1100   KETONESUR NEGATIVE 06/01/2017 1100   PROTEINUR 100 (A) 06/01/2017 1100   UROBILINOGEN 1.0 03/28/2015 1819   NITRITE NEGATIVE 06/01/2017 1100   LEUKOCYTESUR TRACE (A) 06/01/2017 1100   Sepsis Labs: @LABRCNTIP (procalcitonin:4,lacticidven:4) )No results found for this or any previous visit (from the past 240 hour(s)).        Radiology Studies: No results found.    Scheduled Meds: . apixaban  5 mg Oral BID  . atorvastatin  80 mg Oral QHS  . fluticasone furoate-vilanterol  1 puff Inhalation Daily  . folic acid  1 mg Oral Daily  . insulin aspart  0-9 Units Subcutaneous TID WC  . insulin glargine  5 Units Subcutaneous Daily  . levothyroxine  100 mcg Oral QAC breakfast  . macitentan  10 mg Oral Daily  . metoprolol tartrate  50 mg Oral BID  . multivitamin  1 tablet Oral Daily  . multivitamin with minerals  1 tablet Oral Daily  . pantoprazole  40 mg Oral Daily  . ranolazine  500 mg Oral BID  . sildenafil  40 mg Oral TID  . spironolactone  12.5 mg  Oral Daily  . sulfaSALAzine  1,000 mg Oral BID WC  . tiotropium  18 mcg Inhalation Daily   Continuous Infusions: . furosemide Stopped (12/31/17 1445)     LOS: 3 days    Time spent in minutes: 35    Debbe Odea, MD [facility] Pager: [email or web address] Password [facility] 12/31/2017, 4:42 PM

## 2017-12-31 NOTE — Progress Notes (Signed)
Pt transferred to 6E07 per MD order, pt needs PICC for CVP monitoring. Report given to [facility].

## 2018-01-01 ENCOUNTER — Inpatient Hospital Stay (HOSPITAL_COMMUNITY): Payer: Medicare Other

## 2018-01-01 DIAGNOSIS — D529 Folate deficiency anemia, unspecified: Secondary | ICD-10-CM

## 2018-01-01 LAB — BASIC METABOLIC PANEL
ANION GAP: 15 (ref 5–15)
BUN: 48 mg/dL — ABNORMAL HIGH (ref 6–20)
CO2: 28 mmol/L (ref 22–32)
CREATININE: 1.34 mg/dL — AB (ref 0.44–1.00)
Calcium: 8.3 mg/dL — ABNORMAL LOW (ref 8.9–10.3)
Chloride: 89 mmol/L — ABNORMAL LOW (ref 101–111)
GFR, EST AFRICAN AMERICAN: 43 mL/min — AB (ref >60)
GFR, EST NON AFRICAN AMERICAN: 37 mL/min — AB (ref >60)
GLUCOSE: 108 mg/dL — AB (ref 65–99)
Potassium: 4.1 mmol/L (ref 3.5–5.1)
SODIUM: 132 mmol/L — AB (ref 135–145)

## 2018-01-01 LAB — COOXEMETRY PANEL
CARBOXYHEMOGLOBIN: 1.8 % — AB (ref 0.5–1.5)
METHEMOGLOBIN: 1.4 % (ref 0.0–1.5)
O2 SAT: 66.5 %
Total hemoglobin: 11 g/dL — ABNORMAL LOW (ref 12.0–16.0)

## 2018-01-01 LAB — CBC
HCT: 33.6 % — ABNORMAL LOW (ref 36.0–46.0)
Hemoglobin: 10.1 g/dL — ABNORMAL LOW (ref 12.0–15.0)
MCH: 26.8 pg (ref 26.0–34.0)
MCHC: 30.1 g/dL (ref 30.0–36.0)
MCV: 89.1 fL (ref 78.0–100.0)
PLATELETS: 363 10*3/uL (ref 150–400)
RBC: 3.77 MIL/uL — ABNORMAL LOW (ref 3.87–5.11)
RDW: 28.2 % — ABNORMAL HIGH (ref 11.5–15.5)
WBC: 6.6 10*3/uL (ref 4.0–10.5)

## 2018-01-01 LAB — GLUCOSE, CAPILLARY
GLUCOSE-CAPILLARY: 190 mg/dL — AB (ref 65–99)
GLUCOSE-CAPILLARY: 162 mg/dL — AB (ref 65–99)
Glucose-Capillary: 103 mg/dL — ABNORMAL HIGH (ref 65–99)
Glucose-Capillary: 110 mg/dL — ABNORMAL HIGH (ref 65–99)

## 2018-01-01 LAB — VITAMIN B12: Vitamin B-12: 922 pg/mL — ABNORMAL HIGH (ref 180–914)

## 2018-01-01 LAB — FOLATE: FOLATE: 12.7 ng/mL (ref >5.9)

## 2018-01-01 MED ORDER — SODIUM CHLORIDE 0.9% FLUSH: 10.0000 mL | INTRAVENOUS | Status: DC | PRN

## 2018-01-01 MED ORDER — METOLAZONE 5 MG PO TABS
2.5000 mg | ORAL_TABLET | Freq: Once | ORAL | Status: AC
  Administered 2018-01-01: 2.5 mg via ORAL
  Filled 2018-01-01: qty 1

## 2018-01-01 MED ORDER — SPIRONOLACTONE 25 MG PO TABS
25.0000 mg | ORAL_TABLET | Freq: Every day | ORAL | Status: DC
  Administered 2018-01-01 – 2018-01-05 (×5): 25 mg via ORAL
  Filled 2018-01-01 (×5): qty 1

## 2018-01-01 MED ORDER — SODIUM CHLORIDE 0.9% FLUSH
10.0000 mL | Freq: Two times a day (BID) | INTRAVENOUS | Status: DC
  Administered 2018-01-01 – 2018-01-07 (×14): 10 mL

## 2018-01-01 NOTE — Care Management Note (Addendum)
Case Management Note  Patient Details  Name: [name] MRN: [id number] Date of Birth: 1941-01-24  Subjective/Objective:  Pt presented for Acute on Chronic Right Sided CHF. PTA from home alone, however she has support of family. Pt has DME02 @ 2 L via [name], [name] and [name].                  Action/Plan: 1239 01-01-18 CM did discuss [facility] with the patient. CM did provide patient with agency list- Pt chose [name] for services RN/PT. Pt has PICC Line for co-ox monitoring. CM will need orders for [name] RN/PT once stable. No further needs from CM at this time       Expected Discharge Date:                  Expected Discharge Plan:  [name]  In-House Referral:  NA  Discharge planning Services  CM Consult  Post Acute Care Choice:  Home Health Choice offered to:  Patient  DME Arranged:  N/A DME Agency:  NA  HH Arranged:  PT, RN [name] Agency:  [name]  Status of Service:  Completed, signed off  If discussed at [place] of Stay Meetings, dates discussed:    Additional Comments: 1027 01-08-18 [name] Krauss, RN,BSN [phone] CM did speak with patient in regards to plan for SNF at [name]. CM did make pt aware to have family Data processing manager) aware to check @ the patients home for medication delivery. No further needs from CM at this time.    1134 01-07-18 [name] Krauss, RN, BSN [phone] CM did receive call from [place] in regards to [place] to call the patient and the patient is aware to answer the phone. No further needs at this time.    1051 01-07-18 [name] Krauss, RN, BSN [phone] CM was asked from [place] to see if CM could assist with Medications. Pt states she is out of [name] [facility]. CM did call [phone] and spoke with Pharmacist. Per Pharmacist last fill was for Feb and provider listed [name]. Pharmacist stated that Rx was written for 30 day supply and that pt would  need a new Rx. Pharmacist stated that pt would need to go through [place] @ [phone] and submit a Referral Triage Manufacturing Form Package along with the Rx. CM did Relay information to [place] with Heart Failure Team. No further needs from CM at this time.     1102 01-05-18 [name] Krauss, RN,BSN [phone]  CM did speak with pt this am in regards to transitioning home poet hospitalization- per pt she feels that she will need SNF since she has become deconditioned 2/2 to not getting out of bed. CM did state that PT would reevaluate 01-05-18 and make recommendations in regards to disposition needs. CM will continue to monitor.  [name] Roys, RN 01/01/2018, 12:22 PM

## 2018-01-01 NOTE — Care Management Important Message (Signed)
Important Message  Patient Details  Name: [name] MRN: [id number] Date of Birth: [birth year]   Medicare Important Message Given:  Yes    [name] 01/01/2018, 3:47 PM

## 2018-01-01 NOTE — Progress Notes (Signed)
Peripherally Inserted Central Catheter/Midline Placement  The IV Nurse has discussed with the patient and/or persons authorized to consent for the patient, the purpose of this procedure and the potential benefits and risks involved with this procedure.  The benefits include less needle sticks, lab draws from the catheter, and the patient may be discharged home with the catheter. Risks include, but not limited to, infection, bleeding, blood clot (thrombus formation), and puncture of an artery; nerve damage and irregular heartbeat and possibility to perform a PICC exchange if needed/ordered by physician.  Alternatives to this procedure were also discussed.  Bard Power PICC patient education guide, fact sheet on infection prevention and patient information card has been provided to patient /or left at bedside.    PICC/Midline Placement Documentation  PICC Triple Lumen 24/23/53 PICC Right Basilic 42 cm 0 cm (Active)  Indication for Insertion or Continuance of Line Vasoactive infusions 01/01/2018 10:43 AM  Exposed Catheter (cm) 0 cm 01/01/2018 10:43 AM  Site Assessment Clean;Dry;Intact 01/01/2018 10:43 AM  Lumen #1 Status Flushed;Saline locked;Blood return noted 01/01/2018 10:43 AM  Lumen #2 Status Flushed;Saline locked;Blood return noted 01/01/2018 10:43 AM  Lumen #3 Status Flushed;Saline locked;Blood return noted 01/01/2018 10:43 AM  Dressing Type Transparent;Securing device 01/01/2018 10:43 AM  Dressing Status Clean;Dry;Intact;Antimicrobial disc in place 01/01/2018 10:43 AM  Dressing Change Due 01/08/18 01/01/2018 10:43 AM       Frances Maywood 01/01/2018, 10:46 AM

## 2018-01-01 NOTE — Progress Notes (Signed)
PROGRESS NOTE    [name]   EVO:[id number]  DOB: Feb 17, 1941  DOA: 12/25/2017 PCP: [name] Amel, MD   Brief Narrative:  [name]  is a 77 y.o. female, w Dm2, Hypertension, Hyperlipidemia, Hypothyroidism, OSA, CAD, Pafib, CHF (EF 55%), PAH, RV failure sent to the ED for worsening dyspnea. Addmited for acute on chronic combined heart failure  Subjective: Upset about not getting her breakfast on time today. On phone with cafeteria and mainly concerned about breakfast. She has no complaints for me.     Assessment & Plan:   Principal Problem:   Acute on chronic diastolic congestive heart failure, PAH and RV failure - appreciate management per heart failure team - ECHO below shows EF 50-55% and severe dilated atria (left and right) and moderately dialted RV - ran out of Macitentan which has been restarted- cont Sildenafil as well  Active Problems:   Atrial fibrillation- paroxysmal - cont Eliquis, Metoprolol    COPD with emphysema  - chronically on O2- in mid to high 90s on 1.5 L O2-  stable     Coronary artery disease - cont Statin and Eliquis    CKD (chronic kidney disease) stage 3, GFR 30-59 ml/min  - following  with diuresis   DM2 -   A1c 5.8   - on Lantus 5 U (uses 10 at home) and SSI  Gout - PRN Colchicine- d/c order while in hospital and follow clinically   Mild anemia - follow- on folic acid-  anemia panel reveals adequate Iron, folate and B12 stores  Crohn's disease - cont Sulfasalazine     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 Wall thickness was increased in a pattern of moderate   LVH. Systolic function was normal. The estimated ejection  fraction was in the range of 55% to 60%. The study is not   technically sufficient to allow evaluation of LV diastolic   function. - Mitral valve: There was mild regurgitation. - Left atrium: The atrium was severely dilated. - Right ventricle: The cavity size was moderately dilated. - Right atrium: The atrium was severely dilated. - Atrial septum: No defect or patent foramen ovale was identified. - Tricuspid valve: There was moderate regurgitation. - Pulmonary arteries: PA peak pressure: 58 mm Hg (S).  Antimicrobials:  Anti-infectives (From admission, onward)   None       Objective: Vitals:   12/31/17 2114 01/01/18 0539 01/01/18 0824 01/01/18 1203  BP: 116/71 111/68 (!) 115/103 (!) 112/57  Pulse: 77 89 81 78  Resp: 20 18 18 18   Temp: 98.1 F (36.7 C) 97.9 F (36.6 C) 97.6 F (36.4 C) 98.1 F (36.7 C)  TempSrc: Oral Oral Oral Oral  SpO2: 99% 95% 99% 95%  Weight:  92.8 kg (204 lb 8 oz)    Height:        Intake/Output Summary (Last 24 hours) at 01/01/2018 1700 Last data filed at 01/01/2018 1514 Gross per 24 hour  Intake 1782 ml  Output 4900 ml  Net -3118 ml   Filed Weights   12/30/17 0510 12/31/17 0829 01/01/18 0539  Weight: 95.8 kg (211 lb 3.2 oz) 94.3 kg (207 lb 12.8 oz) 92.8 kg (204 lb 8 oz)    Examination:  General exam: Appears comfortable  HEENT: PERRLA, oral mucosa moist, no sclera icterus or thrush Respiratory system: Clear to auscultation. Respiratory effort normal. Cardiovascular system: S1 & S2 heard, RRR.  No murmurs  Gastrointestinal system: Abdomen soft, non-tender, nondistended. Normal bowel sound. No organomegaly Central nervous system: Alert and oriented. No focal neurological deficits. Extremities: No cyanosis, clubbing- 2 +  pedal edema Skin: No rashes or ulcers Psychiatry:  Mood & affect appropriate.     Data Reviewed: I have personally reviewed following labs and imaging studies  CBC: Recent Labs  Lab 12/25/17 1737 12/31/17 0526  01/01/18 0408  WBC 8.1 5.9 6.6  HGB 11.2* 11.1* 10.1*  HCT 39.2 38.0 33.6*  MCV 90.1 90.0 89.1  PLT 383 413* 324   Basic Metabolic Panel: Recent Labs  Lab 12/28/17 0535 12/29/17 0240 12/30/17 0824 12/31/17 0526 01/01/18 0408  NA 137 136 134* 134* 132*  K 3.6 3.5 4.5 3.8 4.1  CL 96* 95* 96* 89* 89*  CO2 28 27 23 30 28   GLUCOSE 124* 124* 123* 129* 108*  BUN 43* 40* 46* 43* 48*  CREATININE 1.65* 1.47* 1.35* 1.45* 1.34*  CALCIUM 8.1* 8.0* 8.2* 8.4* 8.3*  MG  --   --   --  2.1  --    GFR: Estimated Creatinine Clearance: 43.4 mL/min (A) (by C-G formula based on SCr of 1.34 mg/dL (H)). Liver Function Tests: No results for input(s): AST, ALT, ALKPHOS, BILITOT, PROT, ALBUMIN in the last 168 hours. No results for input(s): LIPASE, AMYLASE in the last 168 hours. No results for input(s): AMMONIA in the last 168 hours. Coagulation Profile: No results for input(s): INR, PROTIME in the last 168 hours. Cardiac Enzymes: No results for input(s): CKTOTAL, CKMB, CKMBINDEX, TROPONINI in the last 168 hours. BNP (last 3 results) No results for input(s): PROBNP in the last 8760 hours. HbA1C: Recent Labs    12/31/17 0526  HGBA1C 5.8*   CBG: Recent Labs  Lab 12/31/17 1124 12/31/17 1640 12/31/17 2144 01/01/18 0818 01/01/18 1203  GLUCAP 172* 127* 129* 103* 190*   Lipid Profile: No results for input(s): CHOL, HDL, LDLCALC, TRIG, CHOLHDL, LDLDIRECT in the last 72 hours. Thyroid Function Tests: No results for input(s): TSH, T4TOTAL, FREET4, T3FREE, THYROIDAB in the last 72 hours. Anemia Panel: Recent Labs    12/31/17 1703 01/01/18 0408  VITAMINB12  --  922*  FOLATE  --  12.7  FERRITIN 236  --   TIBC 300  --   IRON 44  --   RETICCTPCT 1.9  --    Urine analysis:    Component Value Date/Time   COLORURINE YELLOW 06/01/2017 1100   APPEARANCEUR CLEAR 06/01/2017 1100   LABSPEC 1.015 06/01/2017 1100   PHURINE 5.0 06/01/2017 1100   GLUCOSEU NEGATIVE 06/01/2017 1100   GLUCOSEU  NEGATIVE 12/08/2007 1621   HGBUR NEGATIVE 06/01/2017 1100   BILIRUBINUR NEGATIVE 06/01/2017 1100   KETONESUR NEGATIVE 06/01/2017 1100   PROTEINUR 100 (A) 06/01/2017 1100   UROBILINOGEN 1.0 03/28/2015 1819   NITRITE NEGATIVE 06/01/2017 1100   LEUKOCYTESUR TRACE (A) 06/01/2017 1100   Sepsis Labs: @LABRCNTIP (procalcitonin:4,lacticidven:4) ) Recent Results (from the past 240 hour(s))  MRSA PCR Screening     Status: None   Collection Time: 12/31/17  9:13 PM  Result Value Ref Range Status   MRSA by PCR NEGATIVE NEGATIVE Final    Comment:        The GeneXpert MRSA Assay (FDA approved for NASAL specimens only), is one component of a comprehensive MRSA  colonization surveillance program. It is not intended to diagnose MRSA infection nor to guide or monitor treatment for MRSA infections. Performed at [facility], [place] [address], [place], [place] [address]          Radiology Studies: Dg Chest Port 1 View  Result Date: 01/01/2018 CLINICAL DATA:  Status post PICC line placement EXAM: PORTABLE CHEST 1 VIEW COMPARISON:  12/25/2017 FINDINGS: Right-sided PICC line is now seen with the catheter tip at the cavoatrial junction in satisfactory position. Cardiac shadow remains enlarged. Pacing device is again seen and stable. The lungs are well aerated with some chronic blunting of the left costophrenic angle. IMPRESSION: PICC line in satisfactory position. Electronically Signed   By: Inez Catalina M.D.   On: 01/01/2018 12:19   [place] Ekg Site Rite  Result Date: 12/31/2017 If Site Rite image not attached, placement could not be confirmed due to current cardiac rhythm.     Scheduled Meds: . apixaban  5 mg Oral BID  . atorvastatin  80 mg Oral QHS  . fluticasone furoate-vilanterol  1 puff Inhalation Daily  . folic acid  1 mg Oral Daily  . insulin aspart  0-9 Units Subcutaneous TID WC  . insulin glargine  5 Units Subcutaneous Daily  . levothyroxine  100 mcg Oral QAC breakfast  .  macitentan  10 mg Oral Daily  . multivitamin  1 tablet Oral Daily  . multivitamin with minerals  1 tablet Oral Daily  . pantoprazole  40 mg Oral Daily  . ranolazine  500 mg Oral BID  . sildenafil  40 mg Oral TID  . sodium chloride flush  10-40 mL Intracatheter Q12H  . spironolactone  25 mg Oral Daily  . sulfaSALAzine  1,000 mg Oral BID WC  . tiotropium  18 mcg Inhalation Daily   Continuous Infusions: . furosemide Stopped (01/01/18 1625)     LOS: 4 days    Time spent in minutes: 35    Debbe Odea, MD [facility] Pager: [email or web address] Password [email or web address] 01/01/2018, 5:00 PM

## 2018-01-01 NOTE — Progress Notes (Signed)
[facility] to walk with pt with rollator. She stated it has not been a good day getting PICC and not sleeping. I woke pt from nap. Pt declined at this time. Will follow and see as time permits. Graylon Good RN BSN 01/01/2018 2:49 PM

## 2018-01-01 NOTE — Progress Notes (Addendum)
Advanced Heart Failure Rounding Note  PCP-Cardiologist: No primary care provider on file.   Subjective:    Modest urine output (-2.2 L) with 120 mg IV lasix TID + metolazone. Weight down another 3 lbs (14 total). Creatinine stable 1.34.   Moved to SD for PICC placement for CVP and co-ox monitoring. Not yet placed.   No SOB, CP, or dizziness. Feels about the same as yesterday.   Echo 12/30/17:  - Left ventricle: Septal flattening indicative of elevated PA   pressures. Wall thickness was increased in a pattern of moderate   LVH. Systolic function was normal. The estimated ejection   fraction was in the range of 55% to 60%. The study is not   technically sufficient to allow evaluation of LV diastolic   function. - Mitral valve: There was mild regurgitation. - Left atrium: The atrium was severely dilated. - Right ventricle: The cavity size was moderately dilated. - Right atrium: The atrium was severely dilated. - Atrial septum: No defect or patent foramen ovale was identified. - Tricuspid valve: There was moderate regurgitation. - Pulmonary arteries: PA peak pressure: 58 mm Hg (S).  Objective:   Weight Range: 204 lb 8 oz (92.8 kg) Body mass index is 29.34 kg/m.   Vital Signs:   Temp:  [97.9 F (36.6 C)-98.2 F (36.8 C)] 97.9 F (36.6 C) (04/04 0539) Pulse Rate:  [70-89] 89 (04/04 0539) Resp:  [18-20] 18 (04/04 0539) BP: (99-116)/(51-71) 111/68 (04/04 0539) SpO2:  [92 %-99 %] 95 % (04/04 0539) Weight:  [204 lb 8 oz (92.8 kg)-207 lb 12.8 oz (94.3 kg)] 204 lb 8 oz (92.8 kg) (04/04 0539) Last BM Date: 12/31/17  Weight change: Filed Weights   12/30/17 0510 12/31/17 0829 01/01/18 0539  Weight: 211 lb 3.2 oz (95.8 kg) 207 lb 12.8 oz (94.3 kg) 204 lb 8 oz (92.8 kg)    Intake/Output:   Intake/Output Summary (Last 24 hours) at 01/01/2018 0733 Last data filed at 01/01/2018 0644 Gross per 24 hour  Intake 1720 ml  Output 3925 ml  Net -2205 ml      Physical Exam    General: Elderly. No resp difficulty. HEENT: Normal Neck: Supple. JVP to jaw with prominent CV waves. Carotids 2+ bilat; no bruits. No thyromegaly or nodule noted. Cor: PMI nondisplaced. RRR, 2/6 TR + RV lift Lungs: CTAB, normal effort. Abdomen: Soft, non-tender, non-distended, no HSM. No bruits or masses. +BS  Extremities: No cyanosis, clubbing, or rash. R and LLE 2+ edema, BLE TED hose on, warm Neuro: Alert & orientedx3, cranial nerves grossly intact. moves all 4 extremities w/o difficulty. Affect pleasant   Telemetry   A fib 70-80s with IVCD. Personally reviewed.   EKG    No new tracings.   Labs    CBC Recent Labs    12/31/17 0526 01/01/18 0408  WBC 5.9 6.6  HGB 11.1* 10.1*  HCT 38.0 33.6*  MCV 90.0 89.1  PLT 413* 297   Basic Metabolic Panel Recent Labs    12/31/17 0526 01/01/18 0408  NA 134* 132*  K 3.8 4.1  CL 89* 89*  CO2 30 28  GLUCOSE 129* 108*  BUN 43* 48*  CREATININE 1.45* 1.34*  CALCIUM 8.4* 8.3*  MG 2.1  --    Liver Function Tests No results for input(s): AST, ALT, ALKPHOS, BILITOT, PROT, ALBUMIN in the last 72 hours. No results for input(s): LIPASE, AMYLASE in the last 72 hours. Cardiac Enzymes No results for input(s): CKTOTAL, CKMB, CKMBINDEX, TROPONINI  in the last 72 hours.  BNP: BNP (last 3 results) Recent Labs    12/25/17 1737  BNP 2,089.7*    ProBNP (last 3 results) No results for input(s): PROBNP in the last 8760 hours.   D-Dimer No results for input(s): DDIMER in the last 72 hours. Hemoglobin A1C Recent Labs    12/31/17 0526  HGBA1C 5.8*   Fasting Lipid Panel No results for input(s): CHOL, HDL, LDLCALC, TRIG, CHOLHDL, LDLDIRECT in the last 72 hours. Thyroid Function Tests No results for input(s): TSH, T4TOTAL, T3FREE, THYROIDAB in the last 72 hours.  Invalid input(s): FREET3  Other results:   Imaging    [place] Ekg Site Rite  Result Date: 12/31/2017 If Site Rite image not attached, placement could not be  confirmed due to current cardiac rhythm.    Medications:     Scheduled Medications: . apixaban  5 mg Oral BID  . atorvastatin  80 mg Oral QHS  . fluticasone furoate-vilanterol  1 puff Inhalation Daily  . folic acid  1 mg Oral Daily  . insulin aspart  0-9 Units Subcutaneous TID WC  . insulin glargine  5 Units Subcutaneous Daily  . levothyroxine  100 mcg Oral QAC breakfast  . macitentan  10 mg Oral Daily  . metoprolol tartrate  50 mg Oral BID  . multivitamin  1 tablet Oral Daily  . multivitamin with minerals  1 tablet Oral Daily  . pantoprazole  40 mg Oral Daily  . ranolazine  500 mg Oral BID  . sildenafil  40 mg Oral TID  . spironolactone  12.5 mg Oral Daily  . sulfaSALAzine  1,000 mg Oral BID WC  . tiotropium  18 mcg Inhalation Daily    Infusions: . furosemide Stopped (12/31/17 2256)    PRN Medications: albuterol    Patient Profile  [name] is a 77 year old with history of Diabetes, HTN, hyperlipidemia, hypothyroidism, obesity, tachy/brady s/p PPM, COPD, GERD, Af ib, CAD, and pulmonary hypertension.   Admitted with increased dyspnea.   Assessment/Plan   1. A/C Combined Systolic/Diastolic Heart Failure  - Echo 12/30/17: EF 55-60%, mild MR, severe biatrial enlargement, RV mod dilated, mod TR, PA peak pressure: 58 (Previously EF 40-45% on Echo 03/2017) - Volume status remains elevated despite increased diuretics.  - Continue 120 mg IV lasix TID. Will give another dose of 2.5 mg metolazone today - Continue 40 meq K daily.  - Increase spiro to 25 mg daily - PICC line to be placed for PICC and co-ox monitoring. As below, may need brief inotropic support to augment diuresis  2. 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 -Out of macitentan about 10 days. Restarted on admit.  - On combination therapy. Macitentan 10 mg daily + sildenafil 20 mg three times a day. On chronic oxygen.  - May have progressive RV failure. Placing PICC line to monitor co-ox and CVP. She may need  brief inotropic support.    3. COPD - On chronic oxygen. No change.   4. [name]- with medtronic PPM - A fib 70-80s  5. Permanent A fib  - Rate controlled. Continue eliquis - DC metoprolol with concern for low output.   6. CAD  Cath 2013 with LAD 70-75% and ostial 95% D2.  - No s/s ischemia - On statin and eliquis.   7. DM2 - On insulin per Primary. No change.   8. CKD Stage III - Creatinine 1.35 > 1.45 > 1.34 - BMET daily  9.  Deconditioing - PT recommending [facility] - Pt declined working with cardiac rehab yesterday.  - Encouraged her to get OOB.   10. Anemia - Hemoglobin 11.1 > 10.1. No s/s bleeding - Iron studies done yesterday. Monitor daily CBC   Medication concerns reviewed with patient and pharmacy team. Barriers identified: HF pharmacist verified opsumit was re-approved.   Length of Stay: E[place], NP  01/01/2018, 7:33 AM  Advanced Heart Failure Team Pager [phone] (M-F; 7a - 4p)  Please contact [facility] for night-coverage after hours (4p -7a ) and weekends on [email or web address]  Patient seen and examined with the above-signed Advanced Practice Provider and/or Housestaff. I personally reviewed laboratory data, imaging studies and relevant notes. I independently examined the patient and formulated the important aspects of the plan. I have edited the note to reflect any of my changes or salient points. I have personally discussed the plan with the patient and/or family.  Feels a bit better this am but remains volume overloaded with 2-3+ edema. PICC line now in. Co-ox 67% so cardiac output is adequate. No need for milrinone at this point. Will continue to push IV diuretics to help with RV failure and volume overload. Continue metolazone. On apixaban for chronic AF. Creatinine stable. Hgb stable. Await CVP readings. D/w bedside RN personally.   Glori Bickers, MD  12:16 PM

## 2018-01-02 LAB — MULTIPLE MYELOMA PANEL, SERUM
ALPHA 1: 0.2 g/dL (ref 0.0–0.4)
Albumin SerPl Elph-Mcnc: 2.8 g/dL — ABNORMAL LOW (ref 2.9–4.4)
Albumin/Glob SerPl: 0.9 (ref 0.7–1.7)
Alpha2 Glob SerPl Elph-Mcnc: 0.8 g/dL (ref 0.4–1.0)
B-Globulin SerPl Elph-Mcnc: 0.9 g/dL (ref 0.7–1.3)
Gamma Glob SerPl Elph-Mcnc: 1.2 g/dL (ref 0.4–1.8)
Globulin, Total: 3.2 g/dL (ref 2.2–3.9)
IGA: 380 mg/dL (ref 64–422)
IGM (IMMUNOGLOBULIN M), SRM: 236 mg/dL — AB (ref 26–217)
IgG (Immunoglobin G), Serum: 1072 mg/dL (ref 700–1600)
TOTAL PROTEIN ELP: 6 g/dL (ref 6.0–8.5)

## 2018-01-02 LAB — CBC
HCT: 36.2 % (ref 36.0–46.0)
Hemoglobin: 10.4 g/dL — ABNORMAL LOW (ref 12.0–15.0)
MCH: 25.6 pg — AB (ref 26.0–34.0)
MCHC: 28.7 g/dL — ABNORMAL LOW (ref 30.0–36.0)
MCV: 88.9 fL (ref 78.0–100.0)
PLATELETS: 363 10*3/uL (ref 150–400)
RBC: 4.07 MIL/uL (ref 3.87–5.11)
RDW: 27.7 % — AB (ref 11.5–15.5)
WBC: 7.3 10*3/uL (ref 4.0–10.5)

## 2018-01-02 LAB — COOXEMETRY PANEL
Carboxyhemoglobin: 2.4 % — ABNORMAL HIGH (ref 0.5–1.5)
Methemoglobin: 1 % (ref 0.0–1.5)
O2 Saturation: 67.9 %
TOTAL HEMOGLOBIN: 10.8 g/dL — AB (ref 12.0–16.0)

## 2018-01-02 LAB — BASIC METABOLIC PANEL
Anion gap: 16 — ABNORMAL HIGH (ref 5–15)
BUN: 44 mg/dL — ABNORMAL HIGH (ref 6–20)
CALCIUM: 8.6 mg/dL — AB (ref 8.9–10.3)
CO2: 34 mmol/L — AB (ref 22–32)
CREATININE: 1.32 mg/dL — AB (ref 0.44–1.00)
Chloride: 83 mmol/L — ABNORMAL LOW (ref 101–111)
GFR calc non Af Amer: 38 mL/min — ABNORMAL LOW (ref >60)
GFR, EST AFRICAN AMERICAN: 44 mL/min — AB (ref >60)
Glucose, Bld: 126 mg/dL — ABNORMAL HIGH (ref 65–99)
Potassium: 3 mmol/L — ABNORMAL LOW (ref 3.5–5.1)
SODIUM: 133 mmol/L — AB (ref 135–145)

## 2018-01-02 LAB — GLUCOSE, CAPILLARY
GLUCOSE-CAPILLARY: 112 mg/dL — AB (ref 65–99)
Glucose-Capillary: 143 mg/dL — ABNORMAL HIGH (ref 65–99)
Glucose-Capillary: 170 mg/dL — ABNORMAL HIGH (ref 65–99)
Glucose-Capillary: 153 mg/dL — ABNORMAL HIGH (ref 65–99)

## 2018-01-02 LAB — MAGNESIUM: Magnesium: 1.8 mg/dL (ref 1.7–2.4)

## 2018-01-02 MED ORDER — POTASSIUM CHLORIDE CRYS ER 20 MEQ PO TBCR
40.0000 meq | EXTENDED_RELEASE_TABLET | Freq: Two times a day (BID) | ORAL | Status: AC
  Administered 2018-01-02 (×2): 40 meq via ORAL
  Filled 2018-01-02 (×2): qty 2

## 2018-01-02 MED ORDER — PHENOL 1.4 % MT LIQD
1.0000 | OROMUCOSAL | Status: DC | PRN
  Administered 2018-01-02 (×2): 1 via OROMUCOSAL
  Filled 2018-01-02: qty 177

## 2018-01-02 MED ORDER — POTASSIUM CHLORIDE CRYS ER 20 MEQ PO TBCR
20.0000 meq | EXTENDED_RELEASE_TABLET | Freq: Once | ORAL | Status: AC
  Administered 2018-01-02: 20 meq via ORAL
  Filled 2018-01-02: qty 1

## 2018-01-02 MED ORDER — KETOROLAC TROMETHAMINE 15 MG/ML IJ SOLN
15.0000 mg | Freq: Once | INTRAMUSCULAR | Status: AC
  Administered 2018-01-02: 15 mg via INTRAVENOUS
  Filled 2018-01-02: qty 1

## 2018-01-02 MED ORDER — ACETAMINOPHEN 325 MG PO TABS
650.0000 mg | ORAL_TABLET | Freq: Four times a day (QID) | ORAL | Status: DC | PRN
  Administered 2018-01-02 – 2018-01-04 (×7): 650 mg via ORAL
  Filled 2018-01-02 (×7): qty 2

## 2018-01-02 MED ORDER — MAGNESIUM SULFATE 2 GM/50ML IV SOLN
2.0000 g | Freq: Once | INTRAVENOUS | Status: AC
  Administered 2018-01-02: 2 g via INTRAVENOUS
  Filled 2018-01-02: qty 50

## 2018-01-02 MED ORDER — ONDANSETRON HCL 4 MG/2ML IJ SOLN
4.0000 mg | Freq: Four times a day (QID) | INTRAMUSCULAR | Status: DC | PRN
  Administered 2018-01-02 – 2018-01-03 (×2): 4 mg via INTRAVENOUS
  Filled 2018-01-02 (×2): qty 2

## 2018-01-02 MED ORDER — IBUPROFEN 200 MG PO TABS: 400.0000 mg | ORAL_TABLET | Freq: Four times a day (QID) | ORAL | Status: DC | PRN

## 2018-01-02 NOTE — Progress Notes (Signed)
Called for frontal headache present all day despite Tylenol given this AM. Also,  vomiting x 2 with ongoing nausea.  . Try low dose of Toradol 15mg  with Zofran and 2 gm IV Mg+.   Debbe Odea, MD

## 2018-01-02 NOTE — Progress Notes (Addendum)
PROGRESS NOTE    SIDNI FUSCO   OZD:[id number]  DOB: 1940-12-13  DOA: 12/25/2017 PCP: [name], MD   Brief Narrative:  [name]  is a 77 y.o. female, w Dm2, Hypertension, Hyperlipidemia, Hypothyroidism, OSA, CAD, Pafib, CHF (EF 55%), PAH, RV failure sent to the ED for worsening dyspnea. Admitted for acute on chronic combined heart failure  Subjective:  She state her legs are now less swollen and she has a mild chronic cough.     Assessment & Plan:   Principal Problem:   Acute on chronic diastolic congestive heart failure, PAH and RV failure - appreciate management per heart failure team - ECHO below shows EF 50-55% and severe dilated atria (left and right) and moderately dialted RV - ran out of Macitentan which has been restarted- cont Sildenafil as well per CHF team  Active Problems: Hypokalemia - due to diuretics- being replaced    Atrial fibrillation- paroxysmal - cont Eliquis -  Metoprolol is on hold by cardiology to allow BP to rise  for diuresis  - HR 90-105 per CCMD    COPD with emphysema  -  Stable - on O2 at night at home - no wheezing, has occasional cough    Coronary artery disease - cont Statin and Eliquis    CKD (chronic kidney disease) stage 3, GFR 30-59 ml/min  - following with diuresis- is stable   DM2 -   A1c 5.8   - on Lantus 5 U (uses 10 at home) and SSI- sugars reasonably stable  Gout - on PRN Colchicine and nothing for prophylaxis-  Mild anemia - follow- on folic acid-  anemia panel reveals adequate Iron, folate and B12 stores  Crohn's disease - cont Sulfasalazine  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 Wall thickness was  increased in a pattern of moderate   LVH. Systolic function was normal. The estimated ejection   fraction was in the range of 55% to 60%. The study is not   technically sufficient to allow evaluation of LV diastolic   function. - Mitral valve: There was mild regurgitation. - Left atrium: The atrium was severely dilated. - Right ventricle: The cavity size was moderately dilated. - Right atrium: The atrium was severely dilated. - Atrial septum: No defect or patent foramen ovale was identified. - Tricuspid valve: There was moderate regurgitation. - Pulmonary arteries: PA peak pressure: 58 mm Hg (S).  Antimicrobials:  Anti-infectives (From admission, onward)   None       Objective: Vitals:   01/02/18 0842 01/02/18 0946 01/02/18 1131 01/02/18 1200  BP: 118/68  109/63   Pulse: 100  (!) 111   Resp:      Temp: 98.1 F (36.7 C)  98.6 F (37 C)   TempSrc: Oral  Oral   SpO2: 92% 98%  94%  Weight:      Height:        Intake/Output Summary (Last 24 hours) at 01/02/2018 1509 Last data filed at 01/02/2018 1413 Gross per 24 hour  Intake 662 ml  Output 3200 ml  Net -2538 ml   Filed Weights   12/31/17 0829 01/01/18 0539 01/02/18 0500  Weight: 94.3 kg (207 lb 12.8 oz) 92.8 kg (204 lb 8  oz) 90.8 kg (200 lb 1.6 oz)    Examination: General exam: Appears comfortable  HEENT: PERRLA, oral mucosa moist, no sclera icterus or thrush Respiratory system: Clear to auscultation. Respiratory effort normal. Cardiovascular system: S1 & S2 heard,  No murmurs  Gastrointestinal system: Abdomen soft, non-tender, nondistended. Normal bowel sound. No organomegaly Central nervous system: Alert and oriented. No focal neurological deficits. Extremities: No cyanosis, clubbing - + edema of legs Skin: No rashes or ulcers Psychiatry:  Mood & affect appropriate.      Data Reviewed: I have personally reviewed following labs and imaging studies  CBC: Recent Labs  Lab 12/31/17 0526 01/01/18 0408  01/02/18 0419  WBC 5.9 6.6 7.3  HGB 11.1* 10.1* 10.4*  HCT 38.0 33.6* 36.2  MCV 90.0 89.1 88.9  PLT 413* 363 528   Basic Metabolic Panel: Recent Labs  Lab 12/29/17 0240 12/30/17 0824 12/31/17 0526 01/01/18 0408 01/02/18 0419  NA 136 134* 134* 132* 133*  K 3.5 4.5 3.8 4.1 3.0*  CL 95* 96* 89* 89* 83*  CO2 27 23 30 28  34*  GLUCOSE 124* 123* 129* 108* 126*  BUN 40* 46* 43* 48* 44*  CREATININE 1.47* 1.35* 1.45* 1.34* 1.32*  CALCIUM 8.0* 8.2* 8.4* 8.3* 8.6*  MG  --   --  2.1  --   --    GFR: Estimated Creatinine Clearance: 43.6 mL/min (A) (by C-G formula based on SCr of 1.32 mg/dL (H)). Liver Function Tests: No results for input(s): AST, ALT, ALKPHOS, BILITOT, PROT, ALBUMIN in the last 168 hours. No results for input(s): LIPASE, AMYLASE in the last 168 hours. No results for input(s): AMMONIA in the last 168 hours. Coagulation Profile: No results for input(s): INR, PROTIME in the last 168 hours. Cardiac Enzymes: No results for input(s): CKTOTAL, CKMB, CKMBINDEX, TROPONINI in the last 168 hours. BNP (last 3 results) No results for input(s): PROBNP in the last 8760 hours. HbA1C: Recent Labs    12/31/17 0526  HGBA1C 5.8*   CBG: Recent Labs  Lab 01/01/18 1203 01/01/18 1708 01/01/18 2133 01/02/18 0834 01/02/18 1128  GLUCAP 190* 162* 110* 143* 170*   Lipid Profile: No results for input(s): CHOL, HDL, LDLCALC, TRIG, CHOLHDL, LDLDIRECT in the last 72 hours. Thyroid Function Tests: No results for input(s): TSH, T4TOTAL, FREET4, T3FREE, THYROIDAB in the last 72 hours. Anemia Panel: Recent Labs    12/31/17 1703 01/01/18 0408  VITAMINB12  --  922*  FOLATE  --  12.7  FERRITIN 236  --   TIBC 300  --   IRON 44  --   RETICCTPCT 1.9  --    Urine analysis:    Component Value Date/Time   COLORURINE YELLOW 06/01/2017 1100   APPEARANCEUR CLEAR 06/01/2017 1100   LABSPEC 1.015 06/01/2017 1100   PHURINE 5.0 06/01/2017 1100   GLUCOSEU NEGATIVE 06/01/2017 1100   GLUCOSEU  NEGATIVE 12/08/2007 1621   HGBUR NEGATIVE 06/01/2017 1100   BILIRUBINUR NEGATIVE 06/01/2017 1100   KETONESUR NEGATIVE 06/01/2017 1100   PROTEINUR 100 (A) 06/01/2017 1100   UROBILINOGEN 1.0 03/28/2015 1819   NITRITE NEGATIVE 06/01/2017 1100   LEUKOCYTESUR TRACE (A) 06/01/2017 1100   Sepsis Labs: @LABRCNTIP (procalcitonin:4,lacticidven:4) ) Recent Results (from the past 240 hour(s))  MRSA PCR Screening     Status: None   Collection Time: 12/31/17  9:13 PM  Result Value Ref Range Status   MRSA by PCR NEGATIVE NEGATIVE Final    Comment:        The GeneXpert MRSA Assay (FDA approved  for NASAL specimens only), is one component of a comprehensive MRSA colonization surveillance program. It is not intended to diagnose MRSA infection nor to guide or monitor treatment for MRSA infections. Performed at [facility], [place] [address] [place], [place] [address]          Radiology Studies: Dg Chest Port 1 View  Result Date: 01/01/2018 CLINICAL DATA:  Status post PICC line placement EXAM: PORTABLE CHEST 1 VIEW COMPARISON:  12/25/2017 FINDINGS: Right-sided PICC line is now seen with the catheter tip at the cavoatrial junction in satisfactory position. Cardiac shadow remains enlarged. Pacing device is again seen and stable. The lungs are well aerated with some chronic blunting of the left costophrenic angle. IMPRESSION: PICC line in satisfactory position. Electronically Signed   By: Inez Catalina M.D.   On: 01/01/2018 12:19   [place] Ekg Site Rite  Result Date: 12/31/2017 If Site Rite image not attached, placement could not be confirmed due to current cardiac rhythm.     Scheduled Meds: . apixaban  5 mg Oral BID  . atorvastatin  80 mg Oral QHS  . fluticasone furoate-vilanterol  1 puff Inhalation Daily  . folic acid  1 mg Oral Daily  . insulin aspart  0-9 Units Subcutaneous TID WC  . insulin glargine  5 Units Subcutaneous Daily  . levothyroxine  100 mcg Oral QAC breakfast  .  macitentan  10 mg Oral Daily  . multivitamin  1 tablet Oral Daily  . multivitamin with minerals  1 tablet Oral Daily  . pantoprazole  40 mg Oral Daily  . potassium chloride  20 mEq Oral Once  . potassium chloride  40 mEq Oral BID  . ranolazine  500 mg Oral BID  . sildenafil  40 mg Oral TID  . sodium chloride flush  10-40 mL Intracatheter Q12H  . spironolactone  25 mg Oral Daily  . sulfaSALAzine  1,000 mg Oral BID WC  . tiotropium  18 mcg Inhalation Daily   Continuous Infusions: . furosemide 120 mg (01/02/18 1413)     LOS: 5 days    Time spent in minutes: 35    Debbe Odea, MD [facility] Pager: [email or web address] Password [email or web address] 01/02/2018, 3:09 PM

## 2018-01-02 NOTE — Progress Notes (Signed)
Pt's bed side commode had urine and stool mixed together, unable to get accurate urine measurement. Will cont to monitor pt.

## 2018-01-02 NOTE — Progress Notes (Signed)
Pt requesting throat spray/cough drops for sore throat. Paged [facility] on call. Order received for chloraseptic spray, PRN.  Jacqlyn Larsen, RN

## 2018-01-02 NOTE — Progress Notes (Addendum)
Advanced Heart Failure Rounding Note  PCP-Cardiologist: No primary care provider on file.   Subjective:    Modest urine output (-2.8 L) with 120 mg IV lasix TID + metolazone. Weight down another 4 lbs (11 total). Creatinine stable 1.32. Co-ox 68%.  Dark stool this am per RN staff. Will check hemoccult.   Feels a little better. No CP, SOB, bleeding, or dizziness. Has not walked at all.   Echo 12/30/17:  - Left ventricle: Septal flattening indicative of elevated PA   pressures. Wall thickness was increased in a pattern of moderate   LVH. Systolic function was normal. The estimated ejection   fraction was in the range of 55% to 60%. The study is not   technically sufficient to allow evaluation of LV diastolic   function. - Mitral valve: There was mild regurgitation. - Left atrium: The atrium was severely dilated. - Right ventricle: The cavity size was moderately dilated. - Right atrium: The atrium was severely dilated. - Atrial septum: No defect or patent foramen ovale was identified. - Tricuspid valve: There was moderate regurgitation. - Pulmonary arteries: PA peak pressure: 58 mm Hg (S).  Objective:   Weight Range: 200 lb 1.6 oz (90.8 kg) Body mass index is 28.71 kg/m.   Vital Signs:   Temp:  [97.6 F (36.4 C)-99 F (37.2 C)] 97.9 F (36.6 C) (04/05 0433) Pulse Rate:  [78-91] 87 (04/05 0433) Resp:  [17-18] 17 (04/05 0433) BP: (104-115)/(57-103) 113/62 (04/05 0433) SpO2:  [91 %-99 %] 99 % (04/05 0433) FiO2 (%):  [26 %] 26 % (04/04 0824) Weight:  [200 lb 1.6 oz (90.8 kg)] 200 lb 1.6 oz (90.8 kg) (04/05 0500) Last BM Date: 01/01/18  Weight change: Filed Weights   12/31/17 0829 01/01/18 0539 01/02/18 0500  Weight: 207 lb 12.8 oz (94.3 kg) 204 lb 8 oz (92.8 kg) 200 lb 1.6 oz (90.8 kg)    Intake/Output:   Intake/Output Summary (Last 24 hours) at 01/02/2018 0717 Last data filed at 01/02/2018 0656 Gross per 24 hour  Intake 1602 ml  Output 4400 ml  Net -2798 ml       Physical Exam   CVP: 11-12 General: Elderly. No resp difficulty. HEENT: Normal anicteric  Neck: Supple. JVP 11-12 Carotids 2+ bilat; no bruits. No thyromegaly or nodule noted. Cor: PMI nondisplaced. IRR, 2/6 TR + RV lift Lungs: fine crackles left base no wheeze  Abdomen: Soft, non-tender, non-distended, no HSM. No bruits or masses. +BS  Extremities: No cyanosis, clubbing, or rash. R and LLE 2-3+ edema BLE TED hose  Neuro: Alert & orientedx3, cranial nerves grossly intact. moves all 4 extremities w/o difficulty. Affect pleasant   Telemetry   Afib 90s. Personally reviewed.   EKG    No new tracings.   Labs    CBC Recent Labs    01/01/18 0408 01/02/18 0419  WBC 6.6 7.3  HGB 10.1* 10.4*  HCT 33.6* 36.2  MCV 89.1 88.9  PLT 363 094   Basic Metabolic Panel Recent Labs    12/31/17 0526 01/01/18 0408 01/02/18 0419  NA 134* 132* 133*  K 3.8 4.1 3.0*  CL 89* 89* 83*  CO2 30 28 34*  GLUCOSE 129* 108* 126*  BUN 43* 48* 44*  CREATININE 1.45* 1.34* 1.32*  CALCIUM 8.4* 8.3* 8.6*  MG 2.1  --   --    Liver Function Tests No results for input(s): AST, ALT, ALKPHOS, BILITOT, PROT, ALBUMIN in the last 72 hours. No results for input(s):  LIPASE, AMYLASE in the last 72 hours. Cardiac Enzymes No results for input(s): CKTOTAL, CKMB, CKMBINDEX, TROPONINI in the last 72 hours.  BNP: BNP (last 3 results) Recent Labs    12/25/17 1737  BNP 2,089.7*    ProBNP (last 3 results) No results for input(s): PROBNP in the last 8760 hours.   D-Dimer No results for input(s): DDIMER in the last 72 hours. Hemoglobin A1C Recent Labs    12/31/17 0526  HGBA1C 5.8*   Fasting Lipid Panel No results for input(s): CHOL, HDL, LDLCALC, TRIG, CHOLHDL, LDLDIRECT in the last 72 hours. Thyroid Function Tests No results for input(s): TSH, T4TOTAL, T3FREE, THYROIDAB in the last 72 hours.  Invalid input(s): FREET3  Other results:   Imaging    Dg Chest Port 1 View  Result  Date: 01/01/2018 CLINICAL DATA:  Status post PICC line placement EXAM: PORTABLE CHEST 1 VIEW COMPARISON:  12/25/2017 FINDINGS: Right-sided PICC line is now seen with the catheter tip at the cavoatrial junction in satisfactory position. Cardiac shadow remains enlarged. Pacing device is again seen and stable. The lungs are well aerated with some chronic blunting of the left costophrenic angle. IMPRESSION: PICC line in satisfactory position. Electronically Signed   By: Inez Catalina M.D.   On: 01/01/2018 12:19     Medications:     Scheduled Medications: . apixaban  5 mg Oral BID  . atorvastatin  80 mg Oral QHS  . fluticasone furoate-vilanterol  1 puff Inhalation Daily  . folic acid  1 mg Oral Daily  . insulin aspart  0-9 Units Subcutaneous TID WC  . insulin glargine  5 Units Subcutaneous Daily  . levothyroxine  100 mcg Oral QAC breakfast  . macitentan  10 mg Oral Daily  . multivitamin  1 tablet Oral Daily  . multivitamin with minerals  1 tablet Oral Daily  . pantoprazole  40 mg Oral Daily  . ranolazine  500 mg Oral BID  . sildenafil  40 mg Oral TID  . sodium chloride flush  10-40 mL Intracatheter Q12H  . spironolactone  25 mg Oral Daily  . sulfaSALAzine  1,000 mg Oral BID WC  . tiotropium  18 mcg Inhalation Daily    Infusions: . furosemide Stopped (01/01/18 2207)    PRN Medications: albuterol, phenol, sodium chloride flush    Patient Profile  Ms Quackenbush is a 77 year old with history of Diabetes, HTN, hyperlipidemia, hypothyroidism, obesity, tachy/brady s/p PPM, COPD, GERD, Af ib, CAD, and pulmonary hypertension.   Admitted with increased dyspnea.   Assessment/Plan   1. A/C Combined Systolic/Diastolic Heart Failure  - Echo 12/30/17: EF 55-60%, mild MR, severe biatrial enlargement, RV mod dilated, mod TR, PA peak pressure: 58 (Previously EF 40-45% on Echo 03/2017) - Volume status remains elevated. - Continue 120 mg IV lasix TID. May be able to transition to PO diuretics tomorrow.   - Continue spiro to 25 mg daily - Consider restarting home losartan 50 mg daily. SBP 100-110s - Co-ox 68% this am.  - Encouraged her to get OOB - Apply thigh high TED hose  2. 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 -Out of macitentan about 10 days. Restarted on admit.  - On combination therapy. Macitentan 10 mg daily + sildenafil 20 mg three times a day. On chronic oxygen.   3. COPD - On chronic oxygen (night O2 at home). No change.   4. Natasha Mead- with medtronic PPM - A fib 90s  5. Permanent A fib  -  Rate controlled. Continue eliquis - metoprolol on hold with HF exacerbation.   6. CAD  Cath 2013 with LAD 70-75% and ostial 95% D2.  - No s/s ischemia - On statin and eliquis.   7. DM2 - On insulin per Primary. No change  8. CKD Stage III - Creatinine stable 1.32 - BMET daily  9. Deconditioing - PT recommending [facility] - Encouraged her to get OOB.   10. Anemia - Hemoglobin 11.1 > 10.1 > 10.4. No s/s bleeding - Iron studies completed. Monitor daily CBC - Per RN, stool was dark this am. Will check occult.  11. Hypokalemia - K 3.0. Will supp   Medication concerns reviewed with patient and pharmacy team. Barriers identified: HF pharmacist verified opsumit was re-approved.   Length of Stay: [place], NP  01/02/2018, 7:17 AM  Advanced Heart Failure Team Pager [phone] (M-F; [place])  Please contact [facility] for night-coverage after hours (4p -7a ) and weekends on [email or web address]   Patient seen and examined with the above-signed Advanced Practice Provider and/or Housestaff. I personally reviewed laboratory data, imaging studies and relevant notes. I independently examined the patient and formulated the important aspects of the plan. I have edited the note to reflect any of my changes or salient points. I have personally discussed the plan with the patient and/or family.  PICC line placed. Co-ox looks good. CVP moderately elevated. Now diuresing well  on higher dose lasix and metolazone. Renal function stable. Would continue IV diuresis. Supp K. Remains in chronic AF. Continue Eliquis. Contine to hold b-blocker and losartan with soft pressures. Would keep in house until fully diuresed.   Glori Bickers, MD  2:59 PM

## 2018-01-02 NOTE — Plan of Care (Signed)
  Problem: Cardiac: Goal: Ability to achieve and maintain adequate cardiopulmonary perfusion will improve Outcome: Progressing Note:  Pt had PICC line placed today for CVP monitoring and CO OX monitoring. Current CVP reading, 7. UOP good so far this shift. Will continue current care of plan as ordered.

## 2018-01-03 ENCOUNTER — Inpatient Hospital Stay (HOSPITAL_COMMUNITY): Payer: Medicare Other

## 2018-01-03 DIAGNOSIS — D72829 Elevated white blood cell count, unspecified: Secondary | ICD-10-CM

## 2018-01-03 DIAGNOSIS — R519 Headache, unspecified: Secondary | ICD-10-CM

## 2018-01-03 DIAGNOSIS — R05 Cough: Secondary | ICD-10-CM

## 2018-01-03 DIAGNOSIS — Z452 Encounter for adjustment and management of vascular access device: Secondary | ICD-10-CM

## 2018-01-03 DIAGNOSIS — R51 Headache: Secondary | ICD-10-CM

## 2018-01-03 DIAGNOSIS — R0989 Other specified symptoms and signs involving the circulatory and respiratory systems: Secondary | ICD-10-CM

## 2018-01-03 DIAGNOSIS — R509 Fever, unspecified: Secondary | ICD-10-CM

## 2018-01-03 DIAGNOSIS — R059 Cough, unspecified: Secondary | ICD-10-CM

## 2018-01-03 LAB — URINALYSIS, ROUTINE W REFLEX MICROSCOPIC
BILIRUBIN URINE: NEGATIVE
Bacteria, UA: NONE SEEN
Glucose, UA: NEGATIVE mg/dL
KETONES UR: NEGATIVE mg/dL
LEUKOCYTES UA: NEGATIVE
NITRITE: NEGATIVE
PH: 5 (ref 5.0–8.0)
Protein, ur: 100 mg/dL — AB
Specific Gravity, Urine: 1.012 (ref 1.005–1.030)

## 2018-01-03 LAB — GLUCOSE, CAPILLARY
GLUCOSE-CAPILLARY: 144 mg/dL — AB (ref 65–99)
Glucose-Capillary: 123 mg/dL — ABNORMAL HIGH (ref 65–99)
Glucose-Capillary: 131 mg/dL — ABNORMAL HIGH (ref 65–99)
Glucose-Capillary: 167 mg/dL — ABNORMAL HIGH (ref 65–99)

## 2018-01-03 LAB — BASIC METABOLIC PANEL
Anion gap: 16 — ABNORMAL HIGH (ref 5–15)
BUN: 40 mg/dL — ABNORMAL HIGH (ref 6–20)
CALCIUM: 9 mg/dL (ref 8.9–10.3)
CHLORIDE: 80 mmol/L — AB (ref 101–111)
CO2: 36 mmol/L — ABNORMAL HIGH (ref 22–32)
CREATININE: 1.35 mg/dL — AB (ref 0.44–1.00)
GFR, EST AFRICAN AMERICAN: 43 mL/min — AB (ref >60)
GFR, EST NON AFRICAN AMERICAN: 37 mL/min — AB (ref >60)
Glucose, Bld: 118 mg/dL — ABNORMAL HIGH (ref 65–99)
Potassium: 4.6 mmol/L (ref 3.5–5.1)
SODIUM: 132 mmol/L — AB (ref 135–145)

## 2018-01-03 LAB — COOXEMETRY PANEL
CARBOXYHEMOGLOBIN: 2.2 % — AB (ref 0.5–1.5)
METHEMOGLOBIN: 0.9 % (ref 0.0–1.5)
O2 SAT: 55.7 %
Total hemoglobin: 11.9 g/dL — ABNORMAL LOW (ref 12.0–16.0)

## 2018-01-03 LAB — CBC
HCT: 39.7 % (ref 36.0–46.0)
Hemoglobin: 11.8 g/dL — ABNORMAL LOW (ref 12.0–15.0)
MCH: 26.9 pg (ref 26.0–34.0)
MCHC: 29.7 g/dL — ABNORMAL LOW (ref 30.0–36.0)
MCV: 90.6 fL (ref 78.0–100.0)
PLATELETS: 369 10*3/uL (ref 150–400)
RBC: 4.38 MIL/uL (ref 3.87–5.11)
RDW: 27.9 % — ABNORMAL HIGH (ref 11.5–15.5)
WBC: 15.7 10*3/uL — AB (ref 4.0–10.5)

## 2018-01-03 LAB — MAGNESIUM: Magnesium: 2.5 mg/dL — ABNORMAL HIGH (ref 1.7–2.4)

## 2018-01-03 LAB — INFLUENZA PANEL BY PCR (TYPE A & B)
Influenza A By PCR: NEGATIVE
Influenza B By PCR: NEGATIVE

## 2018-01-03 MED ORDER — METOPROLOL TARTRATE 5 MG/5ML IV SOLN
5.0000 mg | Freq: Once | INTRAVENOUS | Status: AC
  Administered 2018-01-03: 5 mg via INTRAVENOUS
  Filled 2018-01-03: qty 5

## 2018-01-03 MED ORDER — METOPROLOL TARTRATE 25 MG PO TABS
25.0000 mg | ORAL_TABLET | Freq: Two times a day (BID) | ORAL | Status: DC
  Administered 2018-01-03 – 2018-01-08 (×10): 25 mg via ORAL
  Filled 2018-01-03 (×10): qty 1

## 2018-01-03 NOTE — Progress Notes (Signed)
Advanced Heart Failure Rounding Note  PCP-Cardiologist: No primary care provider on file.   Subjective:    Continues on IV lasix. Given IV lopressor last night for chronic AF with RVR. Weight down another 3 pounds (14 pounds total). Co-ox 56% CVP 9-10  Creatinine stable  Feels "terrible". Appetite poor. Denies abdominal pain or n/v.    Echo 12/30/17:  - Left ventricle: Septal flattening indicative of elevated PA   pressures. Wall thickness was increased in a pattern of moderate   LVH. Systolic function was normal. The estimated ejection   fraction was in the range of 55% to 60%. The study is not   technically sufficient to allow evaluation of LV diastolic   function. - Mitral valve: There was mild regurgitation. - Left atrium: The atrium was severely dilated. - Right ventricle: The cavity size was moderately dilated. - Right atrium: The atrium was severely dilated. - Atrial septum: No defect or patent foramen ovale was identified. - Tricuspid valve: There was moderate regurgitation. - Pulmonary arteries: PA peak pressure: 58 mm Hg (S).  Objective:   Weight Range: 89.4 kg (197 lb) Body mass index is 28.27 kg/m.   Vital Signs:   Temp:  [98.1 F (36.7 C)-99.6 F (37.6 C)] 99.6 F (37.6 C) (04/06 0808) Pulse Rate:  [90-116] 103 (04/06 0808) BP: (92-123)/(57-96) 92/61 (04/06 0828) SpO2:  [94 %-98 %] 94 % (04/06 0808) Weight:  [89.4 kg (197 lb)] 89.4 kg (197 lb) (04/06 0637) Last BM Date: 01/02/18  Weight change: Filed Weights   01/01/18 0539 01/02/18 0500 01/03/18 0637  Weight: 92.8 kg (204 lb 8 oz) 90.8 kg (200 lb 1.6 oz) 89.4 kg (197 lb)    Intake/Output:   Intake/Output Summary (Last 24 hours) at 01/03/2018 0913 Last data filed at 01/03/2018 0639 Gross per 24 hour  Intake 802 ml  Output 2400 ml  Net -1598 ml      Physical Exam   General:  Sitting up in bed. Fatigued appearing No resp difficulty HEENT: normal Neck: supple. no JVD. Carotids 2+ bilat; no  bruits. No lymphadenopathy or thryomegaly appreciated. Cor: PMI nondisplaced. IRR +RV lift  Lungs: clear Abdomen: soft, nontender, nondistended. No hepatosplenomegaly. No bruits or masses. Good bowel sounds. Extremities: no cyanosis, clubbing, rash, 1+ edema. + TED hose Neuro: alert & orientedx3, cranial nerves grossly intact. moves all 4 extremities w/o difficulty. Affect pleasant   Telemetry   Afib 90-120. Personally reviewed.   EKG    No new tracings.   Labs    CBC Recent Labs    01/02/18 0419 01/03/18 0434  WBC 7.3 15.7*  HGB 10.4* 11.8*  HCT 36.2 39.7  MCV 88.9 90.6  PLT 363 062   Basic Metabolic Panel Recent Labs    01/02/18 0419 01/03/18 0434  NA 133* 132*  K 3.0* 4.6  CL 83* 80*  CO2 34* 36*  GLUCOSE 126* 118*  BUN 44* 40*  CREATININE 1.32* 1.35*  CALCIUM 8.6* 9.0  MG 1.8 2.5*   Liver Function Tests No results for input(s): AST, ALT, ALKPHOS, BILITOT, PROT, ALBUMIN in the last 72 hours. No results for input(s): LIPASE, AMYLASE in the last 72 hours. Cardiac Enzymes No results for input(s): CKTOTAL, CKMB, CKMBINDEX, TROPONINI in the last 72 hours.  BNP: BNP (last 3 results) Recent Labs    12/25/17 1737  BNP 2,089.7*    ProBNP (last 3 results) No results for input(s): PROBNP in the last 8760 hours.   D-Dimer No results for  input(s): DDIMER in the last 72 hours. Hemoglobin A1C No results for input(s): HGBA1C in the last 72 hours. Fasting Lipid Panel No results for input(s): CHOL, HDL, LDLCALC, TRIG, CHOLHDL, LDLDIRECT in the last 72 hours. Thyroid Function Tests No results for input(s): TSH, T4TOTAL, T3FREE, THYROIDAB in the last 72 hours.  Invalid input(s): FREET3  Other results:   Imaging    No results found.   Medications:     Scheduled Medications: . apixaban  5 mg Oral BID  . atorvastatin  80 mg Oral QHS  . fluticasone furoate-vilanterol  1 puff Inhalation Daily  . folic acid  1 mg Oral Daily  . insulin aspart  0-9  Units Subcutaneous TID WC  . insulin glargine  5 Units Subcutaneous Daily  . levothyroxine  100 mcg Oral QAC breakfast  . macitentan  10 mg Oral Daily  . multivitamin  1 tablet Oral Daily  . multivitamin with minerals  1 tablet Oral Daily  . pantoprazole  40 mg Oral Daily  . ranolazine  500 mg Oral BID  . sildenafil  40 mg Oral TID  . sodium chloride flush  10-40 mL Intracatheter Q12H  . spironolactone  25 mg Oral Daily  . sulfaSALAzine  1,000 mg Oral BID WC  . tiotropium  18 mcg Inhalation Daily    Infusions: . furosemide Stopped (01/02/18 2125)    PRN Medications: acetaminophen, albuterol, ondansetron (ZOFRAN) IV, phenol, sodium chloride flush    Patient Profile  Ms Grindstaff is a 77 year old with history of Diabetes, HTN, hyperlipidemia, hypothyroidism, obesity, tachy/brady s/p PPM, COPD, GERD, Af ib, CAD, and pulmonary hypertension.   Admitted with increased dyspnea.   Assessment/Plan   1. A/C Combined Systolic/Diastolic Heart Failure with R>>L HF symptoms  - Echo 12/30/17: EF 55-60%, mild MR, severe biatrial enlargement, RV mod dilated, mod TR, PA peak pressure: 58 (Previously EF 40-45% on Echo 03/2017) - Weight down 14 pounds CVP 10-11. WIth RV failure this may be as dry as we can get her. Will hold IV lasix today  - Start po diuretics tomorrow - Continue spiro to 25 mg daily - Co-ox 56% - Consider PYP scan to evaluate for amyloid  2. 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 -Out of macitentan about 10 days. Restarted on admit.  - On combination therapy. Macitentan 10 mg daily + sildenafil 20 mg three times a day. On chronic oxygen.   3. COPD - On chronic oxygen (night O2 at home). No change.   4. Natasha Mead- with medtronic PPM  5. Permanent A fib  - Rate up. Will restart home metoprolol.  - Continue apixaban   6. CAD  Cath 2013 with LAD 70-75% and ostial 95% D2.  - No s/s ischemia  - On statin and eliquis.   7. DM2 - On insulin per Primary. No  change  8. CKD Stage III - Creatinine stable 1.35 - BMET daily  9. Deconditioing - PT recommending [facility] - Encouraged her to get OOB.   10. Hypokalemia - K 4.6 today Will supp  11. Malaise - unclear etiology. WBC up significantly today.  - low-grade temps. HR up.  - will get [place] and UA/UCX - management per [facility] - watch closely for sepsis    Length of Stay: 6  Glori Bickers, MD  01/03/2018, 9:13 AM  Advanced Heart Failure Team Pager [phone] (M-F; 7a - 4p)  Please contact [facility] for night-coverage after hours (4p -7a ) and  weekends on [email or web address]

## 2018-01-03 NOTE — Progress Notes (Signed)
PROGRESS NOTE    [name]   WUJ:[id number]  DOB: 07-04-1941  DOA: 12/25/2017 PCP: [name], MD   Brief Narrative:  [name]  is a 77 y.o. female, w Dm2, Hypertension, Hyperlipidemia, Hypothyroidism, OSA, CAD, Pafib, CHF (EF 55%), PAH, RV failure sent to the ED for worsening dyspnea. Admitted for acute on chronic combined heart failure  Subjective: Has a cough today which is worse than her usual. Not sure of color of her sputum. Still nauseated but no abdominal pain or diarrhea. Has a mild frontal headache    Assessment & Plan:   Principal Problem:   Acute on chronic diastolic congestive heart failure, PAH and RV failure - appreciate management per heart failure team - ECHO below shows EF 50-55% and severe dilated atria (left and right) and moderately dialted RV - ran out of Macitentan which has been restarted- cont Sildenafil as well per CHF team  Active Problems: Low grade fever, leukocytosis - frontal headache, vomiting, increased cough- she has no PND or sinus pain - ? Aspiration  - supportive care with Tylenol, Zofran- she is already on daily Protonix - f/u blood culture- has PICC line  - UA negative - has LLL crackles but only atelectasis on CXR- pulse ox the same on 2 L O2  Hypokalemia - due to diuretics- has been replaced    Atrial fibrillation- paroxysmal - cont Eliquis -  Metoprolol was on hold by cardiology to allow BP to rise for diuresis  - HR 90-105 per CCMD - HR rapid today- Metoprolol resumed by cardiology    COPD with emphysema  -  Stable - on O2 at night at home - no wheezing, has occasional cough    Coronary artery disease - cont Statin and Eliquis    CKD (chronic kidney disease) stage 3, GFR 30-59 ml/min  - following with diuresis- is stable   DM2 -   A1c 5.8   - on Lantus 5 U (uses 10 at home) and SSI- sugars reasonably stable  Gout - on PRN Colchicine and nothing for prophylaxis-  Mild anemia - follow- on folic acid-   anemia panel reveals adequate Iron, folate and B12 stores  Crohn's disease - cont Sulfasalazine  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 Wall thickness was increased in a pattern of moderate   LVH. Systolic function was normal. The estimated ejection   fraction was in the range of 55% to 60%. The study is not   technically sufficient to allow evaluation of LV diastolic   function. - Mitral valve: There was mild regurgitation. - Left atrium: The atrium was severely dilated. - Right ventricle: The cavity size was moderately dilated. - Right atrium: The atrium was severely dilated. - Atrial septum: No defect or patent foramen ovale was identified. - Tricuspid valve: There was moderate regurgitation. - Pulmonary arteries: PA peak pressure: 58 mm Hg (S).  Antimicrobials:  Anti-infectives (From admission, onward)   None       Objective: Vitals:   01/03/18 1151 01/03/18 1156 01/03/18 1204 01/03/18 1218  BP: 110/69 94/61 92/64  (!) 103/47  Pulse: (!) 116 (!) 127    Resp:      Temp:   99.1 F (37.3 C)  TempSrc:   Oral   SpO2: 93% 92%    Weight:      Height:        Intake/Output Summary (Last 24 hours) at 01/03/2018 1614 Last data filed at 01/03/2018 1230 Gross per 24 hour  Intake 822 ml  Output 2600 ml  Net -1778 ml   Filed Weights   01/01/18 0539 01/02/18 0500 01/03/18 0867  Weight: 92.8 kg (204 lb 8 oz) 90.8 kg (200 lb 1.6 oz) 89.4 kg (197 lb)    Examination: General exam: Appears comfortable  HEENT: PERRLA, oral mucosa moist, no sclera icterus or thrush Respiratory system: crackles in LLL- Respiratory effort normal. Cardiovascular system: S1 & S2 heard,  No murmurs    Gastrointestinal system: Abdomen soft, non-tender, nondistended. Normal bowel sound. No organomegaly Central nervous system: Alert and oriented. No focal neurological deficits. Extremities: No cyanosis, clubbing no edema Skin: No rashes or ulcers Psychiatry:  Mood & affect appropriate.      Data Reviewed: I have personally reviewed following labs and imaging studies  CBC: Recent Labs  Lab 12/31/17 0526 01/01/18 0408 01/02/18 0419 01/03/18 0434  WBC 5.9 6.6 7.3 15.7*  HGB 11.1* 10.1* 10.4* 11.8*  HCT 38.0 33.6* 36.2 39.7  MCV 90.0 89.1 88.9 90.6  PLT 413* 363 363 619   Basic Metabolic Panel: Recent Labs  Lab 12/30/17 0824 12/31/17 0526 01/01/18 0408 01/02/18 0419 01/03/18 0434  NA 134* 134* 132* 133* 132*  K 4.5 3.8 4.1 3.0* 4.6  CL 96* 89* 89* 83* 80*  CO2 23 30 28  34* 36*  GLUCOSE 123* 129* 108* 126* 118*  BUN 46* 43* 48* 44* 40*  CREATININE 1.35* 1.45* 1.34* 1.32* 1.35*  CALCIUM 8.2* 8.4* 8.3* 8.6* 9.0  MG  --  2.1  --  1.8 2.5*   GFR: Estimated Creatinine Clearance: 42.4 mL/min (A) (by C-G formula based on SCr of 1.35 mg/dL (H)). Liver Function Tests: No results for input(s): AST, ALT, ALKPHOS, BILITOT, PROT, ALBUMIN in the last 168 hours. No results for input(s): LIPASE, AMYLASE in the last 168 hours. No results for input(s): AMMONIA in the last 168 hours. Coagulation Profile: No results for input(s): INR, PROTIME in the last 168 hours. Cardiac Enzymes: No results for input(s): CKTOTAL, CKMB, CKMBINDEX, TROPONINI in the last 168 hours. BNP (last 3 results) No results for input(s): PROBNP in the last 8760 hours. HbA1C: No results for input(s): HGBA1C in the last 72 hours. CBG: Recent Labs  Lab 01/02/18 1128 01/02/18 1722 01/02/18 2248 01/03/18 0805 01/03/18 1148  GLUCAP 170* 153* 112* 123* 131*   Lipid Profile: No results for input(s): CHOL, HDL, LDLCALC, TRIG, CHOLHDL, LDLDIRECT in the last 72 hours. Thyroid Function Tests: No results for  input(s): TSH, T4TOTAL, FREET4, T3FREE, THYROIDAB in the last 72 hours. Anemia Panel: Recent Labs    12/31/17 1703 01/01/18 0408  VITAMINB12  --  922*  FOLATE  --  12.7  FERRITIN 236  --   TIBC 300  --   IRON 44  --   RETICCTPCT 1.9  --    Urine analysis:    Component Value Date/Time   COLORURINE AMBER (A) 01/03/2018 1220   APPEARANCEUR CLEAR 01/03/2018 1220   LABSPEC 1.012 01/03/2018 1220   PHURINE 5.0 01/03/2018 1220   GLUCOSEU NEGATIVE 01/03/2018 1220   GLUCOSEU NEGATIVE 12/08/2007 1621   HGBUR SMALL (A) 01/03/2018 1220   BILIRUBINUR NEGATIVE 01/03/2018 1220   KETONESUR NEGATIVE 01/03/2018 1220   PROTEINUR 100 (A) 01/03/2018 1220  UROBILINOGEN 1.0 03/28/2015 1819   NITRITE NEGATIVE 01/03/2018 1220   LEUKOCYTESUR NEGATIVE 01/03/2018 1220   Sepsis Labs: @LABRCNTIP (procalcitonin:4,lacticidven:4) ) Recent Results (from the past 240 hour(s))  MRSA PCR Screening     Status: None   Collection Time: 12/31/17  9:13 PM  Result Value Ref Range Status   MRSA by PCR NEGATIVE NEGATIVE Final    Comment:        The GeneXpert MRSA Assay (FDA approved for NASAL specimens only), is one component of a comprehensive MRSA colonization surveillance program. It is not intended to diagnose MRSA infection nor to guide or monitor treatment for MRSA infections. Performed at [facility], [place] [address] [place], [place] [address]          Radiology Studies: Dg Chest 2 View  Result Date: 01/03/2018 CLINICAL DATA:  Cough. EXAM: CHEST - 2 VIEW COMPARISON:  01/01/2018 FINDINGS: Stable cardiac enlargement and appearance of pacemaker. Stable appearance of right upper extremity PICC line with catheter tip in SVC. Lungs show stable chronic disease and mild interstitial and vascular prominence without evidence of overt pulmonary edema. Stable left lower lobe atelectasis. No significant pleural effusion. Bony structures are unremarkable. IMPRESSION: Stable chronic lung disease,  interstitial/vascular prominence, left lower lobe atelectasis and cardiac enlargement. Electronically Signed   By: Aletta Edouard M.D.   On: 01/03/2018 15:20      Scheduled Meds: . apixaban  5 mg Oral BID  . atorvastatin  80 mg Oral QHS  . fluticasone furoate-vilanterol  1 puff Inhalation Daily  . folic acid  1 mg Oral Daily  . insulin aspart  0-9 Units Subcutaneous TID WC  . insulin glargine  5 Units Subcutaneous Daily  . levothyroxine  100 mcg Oral QAC breakfast  . macitentan  10 mg Oral Daily  . metoprolol tartrate  25 mg Oral BID  . multivitamin  1 tablet Oral Daily  . multivitamin with minerals  1 tablet Oral Daily  . pantoprazole  40 mg Oral Daily  . ranolazine  500 mg Oral BID  . sildenafil  40 mg Oral TID  . sodium chloride flush  10-40 mL Intracatheter Q12H  . spironolactone  25 mg Oral Daily  . sulfaSALAzine  1,000 mg Oral BID WC  . tiotropium  18 mcg Inhalation Daily   Continuous Infusions:    LOS: 6 days    Time spent in minutes: 35    Debbe Odea, MD [facility] Pager: [email or web address] Password [email or web address] 01/03/2018, 4:14 PM

## 2018-01-03 NOTE — Progress Notes (Signed)
Paged NP Baltazar Najjar with concerns of pt's HR sustaining between 120-130's, A. Fib/Flutter at rest. Pt has not been getting home dose of metoprolol per MD order to hold for BP support during diuresing. Last BP: BP Readings from Last 1 Encounters:  01/03/18 (!) 121/96    Order received for 5mg  of IV metoprolol x1. Will continue to monitor closely.  Jacqlyn Larsen, RN

## 2018-01-04 DIAGNOSIS — B348 Other viral infections of unspecified site: Secondary | ICD-10-CM

## 2018-01-04 DIAGNOSIS — J440 Chronic obstructive pulmonary disease with acute lower respiratory infection: Secondary | ICD-10-CM

## 2018-01-04 DIAGNOSIS — I480 Paroxysmal atrial fibrillation: Secondary | ICD-10-CM

## 2018-01-04 DIAGNOSIS — J209 Acute bronchitis, unspecified: Secondary | ICD-10-CM

## 2018-01-04 DIAGNOSIS — J9811 Atelectasis: Secondary | ICD-10-CM

## 2018-01-04 LAB — CBC
HEMATOCRIT: 34.8 % — AB (ref 36.0–46.0)
HEMOGLOBIN: 10 g/dL — AB (ref 12.0–15.0)
MCH: 25.8 pg — AB (ref 26.0–34.0)
MCHC: 28.7 g/dL — AB (ref 30.0–36.0)
MCV: 89.9 fL (ref 78.0–100.0)
Platelets: 290 10*3/uL (ref 150–400)
RBC: 3.87 MIL/uL (ref 3.87–5.11)
RDW: 27.5 % — AB (ref 11.5–15.5)
WBC: 12.7 10*3/uL — ABNORMAL HIGH (ref 4.0–10.5)

## 2018-01-04 LAB — RESPIRATORY PANEL BY PCR
Adenovirus: NOT DETECTED
BORDETELLA PERTUSSIS-RVPCR: NOT DETECTED
CHLAMYDOPHILA PNEUMONIAE-RVPPCR: NOT DETECTED
CORONAVIRUS 229E-RVPPCR: NOT DETECTED
CORONAVIRUS HKU1-RVPPCR: NOT DETECTED
Coronavirus NL63: NOT DETECTED
Coronavirus OC43: NOT DETECTED
Influenza A: NOT DETECTED
Influenza B: NOT DETECTED
METAPNEUMOVIRUS-RVPPCR: NOT DETECTED
MYCOPLASMA PNEUMONIAE-RVPPCR: NOT DETECTED
PARAINFLUENZA VIRUS 2-RVPPCR: NOT DETECTED
Parainfluenza Virus 1: NOT DETECTED
Parainfluenza Virus 3: NOT DETECTED
Parainfluenza Virus 4: NOT DETECTED
Respiratory Syncytial Virus: NOT DETECTED
Rhinovirus / Enterovirus: DETECTED — AB

## 2018-01-04 LAB — BASIC METABOLIC PANEL
ANION GAP: 13 (ref 5–15)
BUN: 45 mg/dL — AB (ref 6–20)
CO2: 35 mmol/L — AB (ref 22–32)
Calcium: 8 mg/dL — ABNORMAL LOW (ref 8.9–10.3)
Chloride: 81 mmol/L — ABNORMAL LOW (ref 101–111)
Creatinine, Ser: 1.53 mg/dL — ABNORMAL HIGH (ref 0.44–1.00)
GFR calc Af Amer: 37 mL/min — ABNORMAL LOW (ref >60)
GFR calc non Af Amer: 32 mL/min — ABNORMAL LOW (ref >60)
GLUCOSE: 96 mg/dL (ref 65–99)
Potassium: 4.2 mmol/L (ref 3.5–5.1)
Sodium: 129 mmol/L — ABNORMAL LOW (ref 135–145)

## 2018-01-04 LAB — GLUCOSE, CAPILLARY
Glucose-Capillary: 114 mg/dL — ABNORMAL HIGH (ref 65–99)
Glucose-Capillary: 150 mg/dL — ABNORMAL HIGH (ref 65–99)
Glucose-Capillary: 118 mg/dL — ABNORMAL HIGH (ref 65–99)
Glucose-Capillary: 170 mg/dL — ABNORMAL HIGH (ref 65–99)

## 2018-01-04 LAB — COOXEMETRY PANEL
Carboxyhemoglobin: 2.2 % — ABNORMAL HIGH (ref 0.5–1.5)
Methemoglobin: 1.6 % — ABNORMAL HIGH (ref 0.0–1.5)
O2 Saturation: 90.7 %
TOTAL HEMOGLOBIN: 9.5 g/dL — AB (ref 12.0–16.0)

## 2018-01-04 MED ORDER — PREDNISONE 20 MG PO TABS
40.0000 mg | ORAL_TABLET | Freq: Every day | ORAL | Status: DC
  Administered 2018-01-04 – 2018-01-08 (×5): 40 mg via ORAL
  Filled 2018-01-04 (×6): qty 2

## 2018-01-04 MED ORDER — TORSEMIDE 20 MG PO TABS
20.0000 mg | ORAL_TABLET | Freq: Two times a day (BID) | ORAL | Status: DC
  Administered 2018-01-04 – 2018-01-05 (×2): 20 mg via ORAL
  Filled 2018-01-04 (×3): qty 1

## 2018-01-04 MED ORDER — LEVALBUTEROL HCL 0.63 MG/3ML IN NEBU
0.6300 mg | INHALATION_SOLUTION | Freq: Three times a day (TID) | RESPIRATORY_TRACT | Status: DC
  Administered 2018-01-04 (×2): 0.63 mg via RESPIRATORY_TRACT
  Filled 2018-01-04 (×2): qty 3

## 2018-01-04 NOTE — Progress Notes (Signed)
PROGRESS NOTE    [name]   HEN:[id number]  DOB: October 31, 1940  DOA: 12/25/2017 PCP: [name], MD   Brief Narrative:  [name]  is a 77 y.o. female who lives alone at home and has Dm2, Hypertension, Hyperlipidemia, Hypothyroidism, OSA, CAD, COPD on nocturnal O2 (2L), Pafib, CHF (EF 55%), PAH, RV failure sent to the ED for worsening dyspnea.   Admitted for acute on chronic combined heart failure  Subjective: Still coughing today. No nausea or vomiting.    Assessment & Plan:   Principal Problem:   Acute on chronic diastolic congestive heart failure, PAH and RV failure - appreciate management per heart failure team- sodium 129, Cr 1.53- changed form IV Lasix to Demadex 20 ng BID- also on Aldactone - ECHO below shows EF 50-55% and severe dilated atria (left and right) and moderately dialted RV - ran out of Macitentan which has been restarted- cont Sildenafil as well per CHF team  Active Problems:   Low grade fever, leukocytosis on 4/6- ongoing hypoxia    H/o COPD   - follows with Dr Chase Caller & on 2 O2 at night at home - 4/5 noted to have> frontal headache, vomiting, increased cough, fevers 99 despite taking Tylenol  - supportive care given with Tylenol, Zofran- she is already on daily Protonix -  has PICC line - blood cultures done and found to be neg - Influenza neg - UA negative - has LLL crackles but only LLL atelectasis on CXR  - 4/7- WBC lower today without antibiotics, headache is gone, no vomiting since 4/5, I note a cough again today but she states this is her chronic cough -her pulse ox is 88% on room air today (checked by myself)  - note Resp virus panel is + for Rhinovirus which explains above symptoms - appears that she has diuresed well and this hypoxia is likely related to acute bronchitis due to rhinovirus and underlying COPD -  She is on Breo Ellipta - will add Prednisone and Xopenex TID to help resolve her hypoxia - start IS and PT for LLL  atelectasis  Hypokalemia - due to diuretics- has been replaced    Atrial fibrillation- paroxysmal - cont Eliquis and Metoplrol   Coronary artery disease - cont Statin and Eliquis    CKD (chronic kidney disease) stage 3, GFR 30-59 ml/min  - following with diuresis-    DM2 -   A1c 5.8   - on Lantus 5 U (uses 10 at home) and SSI- sugars reasonably stable  Gout - on PRN Colchicine and nothing for prophylaxis-  Mild anemia - follow- on folic acid-  anemia panel reveals adequate Iron, folate and B12 stores  Crohn's disease - cont Sulfasalazine  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 Wall thickness was increased in a pattern of moderate   LVH. Systolic function was normal. The estimated ejection   fraction was in the range of 55% to 60%. The study is not   technically sufficient to allow evaluation of LV diastolic   function. - Mitral valve: There was mild regurgitation. - Left atrium: The atrium was severely dilated. - Right ventricle: The cavity size was moderately dilated. - Right atrium: The  atrium was severely dilated. - Atrial septum: No defect or patent foramen ovale was identified. - Tricuspid valve: There was moderate regurgitation. - Pulmonary arteries: PA peak pressure: 58 mm Hg (S).  Antimicrobials:  Anti-infectives (From admission, onward)   None       Objective: Vitals:   01/03/18 2101 01/03/18 2131 01/04/18 0527 01/04/18 0737  BP: (!) 110/44  (!) 86/52 (!) 96/47  Pulse: (!) 102 91 81 77  Resp: 16     Temp:   98.7 F (37.1 C) 98.9 F (37.2 C)  TempSrc:   Oral Oral  SpO2: 91%   97%  Weight:   90.1 kg (198 lb 11.2 oz)     Height:        Intake/Output Summary (Last 24 hours) at 01/04/2018 0858 Last data filed at 01/03/2018 1845 Gross per 24 hour  Intake 600 ml  Output 600 ml  Net 0 ml   Filed Weights   01/02/18 0500 01/03/18 0637 01/04/18 0527  Weight: 90.8 kg (200 lb 1.6 oz) 89.4 kg (197 lb) 90.1 kg (198 lb 11.2 oz)    Examination: General exam: Appears comfortable  HEENT: PERRLA, oral mucosa moist, no sclera icterus or thrush Respiratory system: continues to have crackles in LLL- mild wheezing- still has cough Respiratory effort normal. Pulse ox 88% on room air Cardiovascular system: S1 & S2 heard,  No murmurs  Gastrointestinal system: Abdomen soft, non-tender, nondistended. Normal bowel sound. No organomegaly Central nervous system: Alert and oriented. No focal neurological deficits. Extremities: No cyanosis, clubbing no edema Skin: No rashes or ulcers Psychiatry:  Mood & affect appropriate.      Data Reviewed: I have personally reviewed following labs and imaging studies  CBC: Recent Labs  Lab 12/31/17 0526 01/01/18 0408 01/02/18 0419 01/03/18 0434 01/04/18 0500  WBC 5.9 6.6 7.3 15.7* 12.7*  HGB 11.1* 10.1* 10.4* 11.8* 10.0*  HCT 38.0 33.6* 36.2 39.7 34.8*  MCV 90.0 89.1 88.9 90.6 89.9  PLT 413* 363 363 369 324   Basic Metabolic Panel: Recent Labs  Lab 12/31/17 0526 01/01/18 0408 01/02/18 0419 01/03/18 0434 01/04/18 0500  NA 134* 132* 133* 132* 129*  K 3.8 4.1 3.0* 4.6 4.2  CL 89* 89* 83* 80* 81*  CO2 30 28 34* 36* 35*  GLUCOSE 129* 108* 126* 118* 96  BUN 43* 48* 44* 40* 45*  CREATININE 1.45* 1.34* 1.32* 1.35* 1.53*  CALCIUM 8.4* 8.3* 8.6* 9.0 8.0*  MG 2.1  --  1.8 2.5*  --    GFR: Estimated Creatinine Clearance: 37.5 mL/min (A) (by C-G formula based on SCr of 1.53 mg/dL (H)). Liver Function Tests: No results for input(s): AST, ALT, ALKPHOS, BILITOT, PROT, ALBUMIN in the last 168 hours. No results for input(s): LIPASE, AMYLASE in the last 168 hours. No results for  input(s): AMMONIA in the last 168 hours. Coagulation Profile: No results for input(s): INR, PROTIME in the last 168 hours. Cardiac Enzymes: No results for input(s): CKTOTAL, CKMB, CKMBINDEX, TROPONINI in the last 168 hours. BNP (last 3 results) No results for input(s): PROBNP in the last 8760 hours. HbA1C: No results for input(s): HGBA1C in the last 72 hours. CBG: Recent Labs  Lab 01/03/18 0805 01/03/18 1148 01/03/18 1705 01/03/18 2059 01/04/18 0724  GLUCAP 123* 131* 167* 144* 114*   Lipid Profile: No results for input(s): CHOL, HDL, LDLCALC, TRIG, CHOLHDL, LDLDIRECT in the last 72 hours. Thyroid Function Tests: No results for input(s): TSH, T4TOTAL, FREET4, T3FREE, THYROIDAB in the last 72 hours.  Anemia Panel: No results for input(s): VITAMINB12, FOLATE, FERRITIN, TIBC, IRON, RETICCTPCT in the last 72 hours. Urine analysis:    Component Value Date/Time   COLORURINE AMBER (A) 01/03/2018 1220   APPEARANCEUR CLEAR 01/03/2018 1220   LABSPEC 1.012 01/03/2018 1220   PHURINE 5.0 01/03/2018 1220   GLUCOSEU NEGATIVE 01/03/2018 1220   GLUCOSEU NEGATIVE 12/08/2007 1621   HGBUR SMALL (A) 01/03/2018 1220   BILIRUBINUR NEGATIVE 01/03/2018 1220   KETONESUR NEGATIVE 01/03/2018 1220   PROTEINUR 100 (A) 01/03/2018 1220   UROBILINOGEN 1.0 03/28/2015 1819   NITRITE NEGATIVE 01/03/2018 1220   LEUKOCYTESUR NEGATIVE 01/03/2018 1220   Sepsis Labs: @LABRCNTIP (procalcitonin:4,lacticidven:4) ) Recent Results (from the past 240 hour(s))  MRSA PCR Screening     Status: None   Collection Time: 12/31/17  9:13 PM  Result Value Ref Range Status   MRSA by PCR NEGATIVE NEGATIVE Final    Comment:        The GeneXpert MRSA Assay (FDA approved for NASAL specimens only), is one component of a comprehensive MRSA colonization surveillance program. It is not intended to diagnose MRSA infection nor to guide or monitor treatment for MRSA infections. Performed at [facility], [place] [address] [place], [place] [address]          Radiology Studies: Dg Chest 2 View  Result Date: 01/03/2018 CLINICAL DATA:  Cough. EXAM: CHEST - 2 VIEW COMPARISON:  01/01/2018 FINDINGS: Stable cardiac enlargement and appearance of pacemaker. Stable appearance of right upper extremity PICC line with catheter tip in SVC. Lungs show stable chronic disease and mild interstitial and vascular prominence without evidence of overt pulmonary edema. Stable left lower lobe atelectasis. No significant pleural effusion. Bony structures are unremarkable. IMPRESSION: Stable chronic lung disease, interstitial/vascular prominence, left lower lobe atelectasis and cardiac enlargement. Electronically Signed   By: Aletta Edouard M.D.   On: 01/03/2018 15:20      Scheduled Meds: . apixaban  5 mg Oral BID  . atorvastatin  80 mg Oral QHS  . fluticasone furoate-vilanterol  1 puff Inhalation Daily  . folic acid  1 mg Oral Daily  . insulin aspart  0-9 Units Subcutaneous TID WC  . insulin glargine  5 Units Subcutaneous Daily  . levothyroxine  100 mcg Oral QAC breakfast  . macitentan  10 mg Oral Daily  . metoprolol tartrate  25 mg Oral BID  . multivitamin  1 tablet Oral Daily  . multivitamin with minerals  1 tablet Oral Daily  . pantoprazole  40 mg Oral Daily  . ranolazine  500 mg Oral BID  . sildenafil  40 mg Oral TID  . sodium chloride flush  10-40 mL Intracatheter Q12H  . spironolactone  25 mg Oral Daily  . sulfaSALAzine  1,000 mg Oral BID WC  . tiotropium  18 mcg Inhalation Daily   Continuous Infusions:    LOS: 7 days    Time spent in minutes: 35    Debbe Odea, MD [facility] Pager: [email or web address] Password [email or web address] 01/04/2018, 8:58 AM

## 2018-01-04 NOTE — Progress Notes (Signed)
Patient OOB to chair.  Tolerated well.  Using incentive spirometer as instructed.

## 2018-01-04 NOTE — Progress Notes (Signed)
Advanced Heart Failure Rounding Note  PCP-Cardiologist: No primary care provider on file.   Subjective:    Had low grade temps with rising WBCs yesterday. BCX, UA, CXR and flu swab all negative. No further fevers overnight. WBC 15.7-> 12.7.   IV lasix stopped yesterday. Weight up a pound. Feels better but still feels her legs are somewhat swollen.   Metoprolol resumed for AF with RVR. HR 80-90s.   CVP 6-7   Echo 12/30/17:  - Left ventricle: Septal flattening indicative of elevated PA   pressures. Wall thickness was increased in a pattern of moderate   LVH. Systolic function was normal. The estimated ejection   fraction was in the range of 55% to 60%. The study is not   technically sufficient to allow evaluation of LV diastolic   function. - Mitral valve: There was mild regurgitation. - Left atrium: The atrium was severely dilated. - Right ventricle: The cavity size was moderately dilated. - Right atrium: The atrium was severely dilated. - Atrial septum: No defect or patent foramen ovale was identified. - Tricuspid valve: There was moderate regurgitation. - Pulmonary arteries: PA peak pressure: 58 mm Hg (S).  Objective:   Weight Range: 90.1 kg (198 lb 11.2 oz) Body mass index is 28.51 kg/m.   Vital Signs:   Temp:  [98.7 F (37.1 C)-99.2 F (37.3 C)] 98.9 F (37.2 C) (04/07 0737) Pulse Rate:  [77-127] 77 (04/07 0737) Resp:  [16] 16 (04/06 2101) BP: (86-110)/(44-69) 96/47 (04/07 0737) SpO2:  [90 %-97 %] 97 % (04/07 0737) Weight:  [90.1 kg (198 lb 11.2 oz)] 90.1 kg (198 lb 11.2 oz) (04/07 0527) Last BM Date: 01/04/18  Weight change: Filed Weights   01/02/18 0500 01/03/18 0637 01/04/18 0527  Weight: 90.8 kg (200 lb 1.6 oz) 89.4 kg (197 lb) 90.1 kg (198 lb 11.2 oz)    Intake/Output:   Intake/Output Summary (Last 24 hours) at 01/04/2018 1039 Last data filed at 01/04/2018 0929 Gross per 24 hour  Intake 840 ml  Output 750 ml  Net 90 ml      Physical Exam    General:  Sitting up in chair No resp difficulty HEENT: normal Neck: supple. JVP 7 Carotids 2+ bilat; no bruits. No lymphadenopathy or thryomegaly appreciated. Cor: PMI nondisplaced. IRR 2/6 TR Lungs: clear Abdomen: obese soft, nontender, nondistended. No hepatosplenomegaly. No bruits or masses. Good bowel sounds. Extremities: no cyanosis, clubbing, rash, trace edema Neuro: alert & orientedx3, cranial nerves grossly intact. moves all 4 extremities w/o difficulty. Affect pleasant   Telemetry   Afib 80-90s. Personally reviewed.   EKG    No new tracings.   Labs    CBC Recent Labs    01/03/18 0434 01/04/18 0500  WBC 15.7* 12.7*  HGB 11.8* 10.0*  HCT 39.7 34.8*  MCV 90.6 89.9  PLT 369 563   Basic Metabolic Panel Recent Labs    01/02/18 0419 01/03/18 0434 01/04/18 0500  NA 133* 132* 129*  K 3.0* 4.6 4.2  CL 83* 80* 81*  CO2 34* 36* 35*  GLUCOSE 126* 118* 96  BUN 44* 40* 45*  CREATININE 1.32* 1.35* 1.53*  CALCIUM 8.6* 9.0 8.0*  MG 1.8 2.5*  --    Liver Function Tests No results for input(s): AST, ALT, ALKPHOS, BILITOT, PROT, ALBUMIN in the last 72 hours. No results for input(s): LIPASE, AMYLASE in the last 72 hours. Cardiac Enzymes No results for input(s): CKTOTAL, CKMB, CKMBINDEX, TROPONINI in the last 72 hours.  BNP:  BNP (last 3 results) Recent Labs    12/25/17 1737  BNP 2,089.7*    ProBNP (last 3 results) No results for input(s): PROBNP in the last 8760 hours.   D-Dimer No results for input(s): DDIMER in the last 72 hours. Hemoglobin A1C No results for input(s): HGBA1C in the last 72 hours. Fasting Lipid Panel No results for input(s): CHOL, HDL, LDLCALC, TRIG, CHOLHDL, LDLDIRECT in the last 72 hours. Thyroid Function Tests No results for input(s): TSH, T4TOTAL, T3FREE, THYROIDAB in the last 72 hours.  Invalid input(s): FREET3  Other results:   Imaging    Dg Chest 2 View  Result Date: 01/03/2018 CLINICAL DATA:  Cough. EXAM: CHEST - 2  VIEW COMPARISON:  01/01/2018 FINDINGS: Stable cardiac enlargement and appearance of pacemaker. Stable appearance of right upper extremity PICC line with catheter tip in SVC. Lungs show stable chronic disease and mild interstitial and vascular prominence without evidence of overt pulmonary edema. Stable left lower lobe atelectasis. No significant pleural effusion. Bony structures are unremarkable. IMPRESSION: Stable chronic lung disease, interstitial/vascular prominence, left lower lobe atelectasis and cardiac enlargement. Electronically Signed   By: Aletta Edouard M.D.   On: 01/03/2018 15:20     Medications:     Scheduled Medications: . apixaban  5 mg Oral BID  . atorvastatin  80 mg Oral QHS  . fluticasone furoate-vilanterol  1 puff Inhalation Daily  . folic acid  1 mg Oral Daily  . insulin aspart  0-9 Units Subcutaneous TID WC  . insulin glargine  5 Units Subcutaneous Daily  . levothyroxine  100 mcg Oral QAC breakfast  . macitentan  10 mg Oral Daily  . metoprolol tartrate  25 mg Oral BID  . multivitamin  1 tablet Oral Daily  . multivitamin with minerals  1 tablet Oral Daily  . pantoprazole  40 mg Oral Daily  . ranolazine  500 mg Oral BID  . sildenafil  40 mg Oral TID  . sodium chloride flush  10-40 mL Intracatheter Q12H  . spironolactone  25 mg Oral Daily  . sulfaSALAzine  1,000 mg Oral BID WC  . tiotropium  18 mcg Inhalation Daily    Infusions:   PRN Medications: acetaminophen, albuterol, ondansetron (ZOFRAN) IV, phenol, sodium chloride flush    Patient Profile  [name] is a 77 year old with history of Diabetes, HTN, hyperlipidemia, hypothyroidism, obesity, tachy/brady s/p PPM, COPD, GERD, Af ib, CAD, and pulmonary hypertension.   Admitted with increased dyspnea.   Assessment/Plan   1. 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  - Weight down 13 pounds total.  CVP 6-7. WIth RV failure this may be as dry as we can get her. In looking back through her clinic notes she is well below any of her previous weights. (she was 254 pounds when she was discharged April 2017) Will resume home torsemide today - Continue spiro to 25 mg daily - Co-ox inaccurate - Consider PYP scan to evaluate for amyloid will order for am.   2. 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 -Out of macitentan about 10 days. Restarted on admit.  - On combination therapy. Macitentan 10 mg daily + sildenafil 20 mg three times a day. On chronic oxygen.   3. COPD - On chronic oxygen (night O2 at home).  No change.   4. [name]- with medtronic PPM  5. Permanent A fib  - Rate improved with restarting lopressor - Continue apixaban   6. CAD  Cath 2013 with LAD 70-75% and ostial 95% D2.  - No s/s ischemia - On statin and eliquis   7. DM2 - On insulin per Primary. No change - Consider Jardiance  8. CKD Stage III - Creatinine up slightly to 1.5 - BMET daily  9. Deconditioing - PT recommending [facility] - Encouraged her to get OOB.   10. Hypokalemia - K 4.2 today   11.Leukocytosis - improving  - CXR, UA and bcx remaining negative. Flu swab negative - Continue incentive spirometer   Dispo: Overall improved. HR and fluid status improved. Still weak.  Needs to ambulate more. Hopefully home 24-48 hours at discretion of primary team.    Length of Stay: [place], [place]  01/04/2018, 10:39 AM  Advanced Heart Failure Team Pager [phone] (M-F; 7a - 4p)  Please contact [facility] for night-coverage after hours (4p -7a ) and weekends on [email or web address]

## 2018-01-05 ENCOUNTER — Inpatient Hospital Stay (HOSPITAL_COMMUNITY): Payer: Medicare Other

## 2018-01-05 DIAGNOSIS — J206 Acute bronchitis due to rhinovirus: Secondary | ICD-10-CM

## 2018-01-05 LAB — BASIC METABOLIC PANEL
Anion gap: 14 (ref 5–15)
BUN: 55 mg/dL — AB (ref 6–20)
CALCIUM: 7.9 mg/dL — AB (ref 8.9–10.3)
CO2: 34 mmol/L — ABNORMAL HIGH (ref 22–32)
Chloride: 81 mmol/L — ABNORMAL LOW (ref 101–111)
Creatinine, Ser: 1.49 mg/dL — ABNORMAL HIGH (ref 0.44–1.00)
GFR calc Af Amer: 38 mL/min — ABNORMAL LOW (ref >60)
GFR, EST NON AFRICAN AMERICAN: 33 mL/min — AB (ref >60)
GLUCOSE: 149 mg/dL — AB (ref 65–99)
Potassium: 4 mmol/L (ref 3.5–5.1)
SODIUM: 129 mmol/L — AB (ref 135–145)

## 2018-01-05 LAB — CBC WITH DIFFERENTIAL/PLATELET
BASOS PCT: 0 %
Basophils Absolute: 0 10*3/uL (ref 0.0–0.1)
Eosinophils Absolute: 0 10*3/uL (ref 0.0–0.7)
Eosinophils Relative: 0 %
HCT: 32.7 % — ABNORMAL LOW (ref 36.0–46.0)
HEMOGLOBIN: 9.8 g/dL — AB (ref 12.0–15.0)
LYMPHS PCT: 9 %
Lymphs Abs: 0.9 10*3/uL (ref 0.7–4.0)
MCH: 26.8 pg (ref 26.0–34.0)
MCHC: 30 g/dL (ref 30.0–36.0)
MCV: 89.3 fL (ref 78.0–100.0)
MONO ABS: 0.8 10*3/uL (ref 0.1–1.0)
Monocytes Relative: 8 %
NEUTROS ABS: 8.6 10*3/uL — AB (ref 1.7–7.7)
Neutrophils Relative %: 83 %
Platelets: 376 10*3/uL (ref 150–400)
RBC: 3.66 MIL/uL — ABNORMAL LOW (ref 3.87–5.11)
RDW: 26.9 % — ABNORMAL HIGH (ref 11.5–15.5)
WBC: 10.3 10*3/uL (ref 4.0–10.5)

## 2018-01-05 LAB — COOXEMETRY PANEL
CARBOXYHEMOGLOBIN: 2.2 % — AB (ref 0.5–1.5)
METHEMOGLOBIN: 0.9 % (ref 0.0–1.5)
O2 Saturation: 75.2 %
TOTAL HEMOGLOBIN: 9.4 g/dL — AB (ref 12.0–16.0)

## 2018-01-05 LAB — CBC
HCT: 32.5 % — ABNORMAL LOW (ref 36.0–46.0)
Hemoglobin: 9.5 g/dL — ABNORMAL LOW (ref 12.0–15.0)
MCH: 26.1 pg (ref 26.0–34.0)
MCHC: 29.2 g/dL — ABNORMAL LOW (ref 30.0–36.0)
MCV: 89.3 fL (ref 78.0–100.0)
PLATELETS: 297 10*3/uL (ref 150–400)
RBC: 3.64 MIL/uL — ABNORMAL LOW (ref 3.87–5.11)
RDW: 27.2 % — AB (ref 11.5–15.5)
WBC: 11 10*3/uL — ABNORMAL HIGH (ref 4.0–10.5)

## 2018-01-05 LAB — URINE CULTURE

## 2018-01-05 LAB — GLUCOSE, CAPILLARY
GLUCOSE-CAPILLARY: 139 mg/dL — AB (ref 65–99)
GLUCOSE-CAPILLARY: 140 mg/dL — AB (ref 65–99)
Glucose-Capillary: 185 mg/dL — ABNORMAL HIGH (ref 65–99)
Glucose-Capillary: 139 mg/dL — ABNORMAL HIGH (ref 65–99)

## 2018-01-05 LAB — OCCULT BLOOD X 1 CARD TO LAB, STOOL: FECAL OCCULT BLD: POSITIVE — AB

## 2018-01-05 MED ORDER — LEVALBUTEROL HCL 0.63 MG/3ML IN NEBU: 0.6300 mg | INHALATION_SOLUTION | Freq: Four times a day (QID) | RESPIRATORY_TRACT | Status: DC | PRN

## 2018-01-05 MED ORDER — DM-GUAIFENESIN ER 30-600 MG PO TB12
1.0000 | ORAL_TABLET | Freq: Two times a day (BID) | ORAL | Status: DC
  Administered 2018-01-05 – 2018-01-08 (×7): 1 via ORAL
  Filled 2018-01-05 (×7): qty 1

## 2018-01-05 MED ORDER — AZITHROMYCIN 250 MG PO TABS: 250.0000 mg | ORAL_TABLET | Freq: Every day | ORAL | Status: DC

## 2018-01-05 MED ORDER — TECHNETIUM TC 99M PYROPHOSPHATE
20.0000 | Freq: Once | INTRAVENOUS | Status: AC
  Administered 2018-01-05: 20 via INTRAVENOUS
  Filled 2018-01-05: qty 20

## 2018-01-05 MED ORDER — PANTOPRAZOLE SODIUM 40 MG IV SOLR
40.0000 mg | Freq: Two times a day (BID) | INTRAVENOUS | Status: DC
  Administered 2018-01-05 – 2018-01-06 (×2): 40 mg via INTRAVENOUS
  Filled 2018-01-05 (×2): qty 40

## 2018-01-05 MED ORDER — AZITHROMYCIN 250 MG PO TABS: 500.0000 mg | ORAL_TABLET | Freq: Every day | ORAL | Status: DC

## 2018-01-05 MED ORDER — LEVALBUTEROL HCL 0.63 MG/3ML IN NEBU
0.6300 mg | INHALATION_SOLUTION | Freq: Three times a day (TID) | RESPIRATORY_TRACT | Status: DC
  Administered 2018-01-05: 0.63 mg via RESPIRATORY_TRACT
  Filled 2018-01-05: qty 3

## 2018-01-05 NOTE — Progress Notes (Signed)
PROGRESS NOTE    [name]   KYH:[id number]  DOB: 1940-11-05  DOA: 12/25/2017 PCP: [name], MD   Brief Narrative:  [name]  is a 77 y.o. female who lives alone at home and has Dm2, Hypertension, Hyperlipidemia, Hypothyroidism, OSA, CAD, COPD on nocturnal O2 (2L), Pafib, CHF (EF 55%), PAH, RV failure sent to the ED for worsening dyspnea.   Admitted for acute on chronic combined heart failure  Subjective: Still coughing today. No nausea or vomiting.    Assessment & Plan:   Principal Problem:   Acute on chronic diastolic congestive heart failure, PAH and RV failure - appreciate management per heart failure team- sodium 129, Cr 1.53- changed form IV Lasix to Demadex 20 ng BID- also on Aldactone - ECHO below shows EF 50-55% and severe dilated atria (left and right) and moderately dialted RV - ran out of Macitentan which has been restarted- cont Sildenafil as well per CHF team  Active Problems:   Low grade fever, leukocytosis hypoxia on 4/6    H/o COPD     Acute bronchitis due to rhinovirus - follows with Dr Chase Caller & on 2 O2 at night at home - 4/5 noted to have> frontal headache, vomiting, increased cough, fevers 99 despite taking Tylenol  - supportive care given with Tylenol, Zofran- she is already on daily Protonix -  has PICC line - blood cultures done and found to be neg - Influenza neg - UA negative - had LLL crackles but only LLL atelectasis on CXR   - note Resp virus panel is + for Rhinovirus which explains above symptoms - appears that she has diuresed well and this hypoxia is likely related to acute bronchitis due to rhinovirus and underlying COPD - 4/7-  She is on Breo Ellipta - more congested, still hypoxic 88% on room air >  will add Prednisone and Xopenex TID   - start IS and PT for LLL atelectasis - leukocytosis resolving without antibiotics 4/8 - ongoing hypoxia and congestion> flutter valve, chest PT added  Hypokalemia - due to diuretics-  has been replaced    Atrial fibrillation- paroxysmal - cont Eliquis and Metoplrol   Coronary artery disease - cont Statin and Eliquis    CKD (chronic kidney disease) stage 3, GFR 30-59 ml/min  - following with diuresis-    DM2 -   A1c 5.8   - on Lantus 5 U (uses 10 at home) and SSI- sugars reasonably stable  Gout - on PRN Colchicine and nothing for prophylaxis-  Mild anemia - follow- on folic acid-  anemia panel reveals adequate Iron, folate and B12 stores  Crohn's disease - cont Sulfasalazine  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 Wall thickness was increased in a pattern of moderate   LVH. Systolic function was normal. The estimated ejection   fraction was in the range of 55% to 60%. The study is not   technically sufficient to allow evaluation of LV diastolic   function. - Mitral valve: There was mild regurgitation. - Left atrium: The atrium was severely dilated. - Right ventricle: The cavity size was moderately dilated. - Right atrium: The atrium was severely dilated. - Atrial septum: No defect or  patent foramen ovale was identified. - Tricuspid valve: There was moderate regurgitation. - Pulmonary arteries: PA peak pressure: 58 mm Hg (S).  Antimicrobials:  Anti-infectives (From admission, onward)   Start     Dose/Rate Route Frequency Ordered Stop   01/06/18 1000  azithromycin (ZITHROMAX) tablet 250 mg  Status:  Discontinued     250 mg Oral Daily 01/05/18 0904 01/05/18 0905   01/05/18 1000  azithromycin (ZITHROMAX) tablet 500 mg  Status:  Discontinued     500 mg Oral Daily 01/05/18 0904 01/05/18 0905       Objective: Vitals:   01/05/18 0634 01/05/18  0814 01/05/18 0925 01/05/18 0935  BP:  110/81    Pulse: 91 96    Resp:  16    Temp:  98.3 F (36.8 C)    TempSrc:  Oral    SpO2: 95% 94% 93% 92%  Weight:      Height:        Intake/Output Summary (Last 24 hours) at 01/05/2018 1225 Last data filed at 01/05/2018 0900 Gross per 24 hour  Intake 480 ml  Output 900 ml  Net -420 ml   Filed Weights   01/03/18 0637 01/04/18 0527 01/05/18 0531  Weight: 89.4 kg (197 lb) 90.1 kg (198 lb 11.2 oz) 90.6 kg (199 lb 12.8 oz)    Examination: General exam: Appears comfortable  HEENT: PERRLA, oral mucosa moist, no sclera icterus or thrush Respiratory system: continues to have crackles in LLL- mild wheezing- still has cough Respiratory effort normal. Pulse ox 88% on room air Cardiovascular system: S1 & S2 heard,  No murmurs  Gastrointestinal system: Abdomen soft, non-tender, nondistended. Normal bowel sound. No organomegaly Central nervous system: Alert and oriented. No focal neurological deficits. Extremities: No cyanosis, clubbing no edema Skin: No rashes or ulcers Psychiatry:  Mood & affect appropriate.      Data Reviewed: I have personally reviewed following labs and imaging studies  CBC: Recent Labs  Lab 01/01/18 0408 01/02/18 0419 01/03/18 0434 01/04/18 0500 01/05/18 0440  WBC 6.6 7.3 15.7* 12.7* 11.0*  HGB 10.1* 10.4* 11.8* 10.0* 9.5*  HCT 33.6* 36.2 39.7 34.8* 32.5*  MCV 89.1 88.9 90.6 89.9 89.3  PLT 363 363 369 290 161   Basic Metabolic Panel: Recent Labs  Lab 12/31/17 0526 01/01/18 0408 01/02/18 0419 01/03/18 0434 01/04/18 0500 01/05/18 0440  NA 134* 132* 133* 132* 129* 129*  K 3.8 4.1 3.0* 4.6 4.2 4.0  CL 89* 89* 83* 80* 81* 81*  CO2 30 28 34* 36* 35* 34*  GLUCOSE 129* 108* 126* 118* 96 149*  BUN 43* 48* 44* 40* 45* 55*  CREATININE 1.45* 1.34* 1.32* 1.35* 1.53* 1.49*  CALCIUM 8.4* 8.3* 8.6* 9.0 8.0* 7.9*  MG 2.1  --  1.8 2.5*  --   --    GFR: Estimated Creatinine Clearance: 38.6 mL/min (A) (by C-G formula  based on SCr of 1.49 mg/dL (H)). Liver Function Tests: No results for input(s): AST, ALT, ALKPHOS, BILITOT, PROT, ALBUMIN in the last 168 hours. No results for input(s): LIPASE, AMYLASE in the last 168 hours. No results for input(s): AMMONIA in the last 168 hours. Coagulation Profile: No results for input(s): INR, PROTIME in the last 168 hours. Cardiac Enzymes: No results for input(s): CKTOTAL, CKMB, CKMBINDEX, TROPONINI in the last 168 hours. BNP (last 3 results) No results for input(s): PROBNP in the last 8760 hours. HbA1C: No results for input(s): HGBA1C in the last 72 hours. CBG: Recent Labs  Lab 01/04/18 1138  01/04/18 1652 01/04/18 2103 01/05/18 0812 01/05/18 1131  GLUCAP 150* 118* 170* 139* 140*   Lipid Profile: No results for input(s): CHOL, HDL, LDLCALC, TRIG, CHOLHDL, LDLDIRECT in the last 72 hours. Thyroid Function Tests: No results for input(s): TSH, T4TOTAL, FREET4, T3FREE, THYROIDAB in the last 72 hours. Anemia Panel: No results for input(s): VITAMINB12, FOLATE, FERRITIN, TIBC, IRON, RETICCTPCT in the last 72 hours. Urine analysis:    Component Value Date/Time   COLORURINE AMBER (A) 01/03/2018 1220   APPEARANCEUR CLEAR 01/03/2018 1220   LABSPEC 1.012 01/03/2018 1220   PHURINE 5.0 01/03/2018 1220   GLUCOSEU NEGATIVE 01/03/2018 1220   GLUCOSEU NEGATIVE 12/08/2007 1621   HGBUR SMALL (A) 01/03/2018 1220   BILIRUBINUR NEGATIVE 01/03/2018 1220   KETONESUR NEGATIVE 01/03/2018 1220   PROTEINUR 100 (A) 01/03/2018 1220   UROBILINOGEN 1.0 03/28/2015 1819   NITRITE NEGATIVE 01/03/2018 1220   LEUKOCYTESUR NEGATIVE 01/03/2018 1220   Sepsis Labs: @LABRCNTIP (procalcitonin:4,lacticidven:4) ) Recent Results (from the past 240 hour(s))  MRSA PCR Screening     Status: None   Collection Time: 12/31/17  9:13 PM  Result Value Ref Range Status   MRSA by PCR NEGATIVE NEGATIVE Final    Comment:        The GeneXpert MRSA Assay (FDA approved for NASAL specimens only), is one  component of a comprehensive MRSA colonization surveillance program. It is not intended to diagnose MRSA infection nor to guide or monitor treatment for MRSA infections. Performed at [facility], [place] [address], [place], [place] [address]   Culture, blood (routine x 2)     Status: None (Preliminary result)   Collection Time: 01/03/18 10:00 AM  Result Value Ref Range Status   Specimen Description BLOOD LEFT WRIST  Final   Special Requests   Final    BOTTLES DRAWN AEROBIC ONLY Blood Culture adequate volume   Culture   Final    NO GROWTH 1 DAY Performed at [facility], [place] [address], [place], [place] [address]    Report Status PENDING  Incomplete  Culture, blood (routine x 2)     Status: None (Preliminary result)   Collection Time: 01/03/18 10:10 AM  Result Value Ref Range Status   Specimen Description BLOOD LEFT HAND  Final   Special Requests   Final    BOTTLES DRAWN AEROBIC ONLY Blood Culture adequate volume   Culture   Final    NO GROWTH 1 DAY Performed at [facility], [place] [address] [place],  [address]    Report Status PENDING  Incomplete  Respiratory Panel by PCR     Status: Abnormal   Collection Time: 01/03/18  6:44 PM  Result Value Ref Range Status   Adenovirus NOT DETECTED NOT DETECTED Final   Coronavirus 229E NOT DETECTED NOT DETECTED Final   Coronavirus HKU1 NOT DETECTED NOT DETECTED Final   Coronavirus NL63 NOT DETECTED NOT DETECTED Final   Coronavirus OC43 NOT DETECTED NOT DETECTED Final   Metapneumovirus NOT DETECTED NOT DETECTED Final   Rhinovirus / Enterovirus DETECTED (A) NOT DETECTED Final   Influenza A NOT DETECTED NOT DETECTED Final   Influenza B NOT DETECTED NOT DETECTED Final   Parainfluenza Virus 1 NOT DETECTED NOT DETECTED Final   Parainfluenza Virus 2 NOT DETECTED NOT DETECTED Final   Parainfluenza Virus 3 NOT DETECTED NOT DETECTED Final   Parainfluenza Virus 4 NOT DETECTED NOT DETECTED Final   Respiratory Syncytial  Virus NOT DETECTED NOT DETECTED Final   Bordetella  pertussis NOT DETECTED NOT DETECTED Final   Chlamydophila pneumoniae NOT DETECTED NOT DETECTED Final   Mycoplasma pneumoniae NOT DETECTED NOT DETECTED Final    Comment: Performed at [facility], [place] [address] [place], [place] [address]  Culture, Urine     Status: Abnormal   Collection Time: 01/04/18 12:00 AM  Result Value Ref Range Status   Specimen Description URINE, RANDOM  Final   Special Requests   Final    NONE Performed at [facility], [place] [address] [place],  [address]    Culture MULTIPLE SPECIES PRESENT, SUGGEST RECOLLECTION (A)  Final   Report Status 01/05/2018 FINAL  Final         Radiology Studies: Dg Chest 2 View  Result Date: 01/03/2018 CLINICAL DATA:  Cough. EXAM: CHEST - 2 VIEW COMPARISON:  01/01/2018 FINDINGS: Stable cardiac enlargement and appearance of pacemaker. Stable appearance of right upper extremity PICC line with catheter tip in SVC. Lungs show stable chronic disease and mild interstitial and vascular prominence without evidence of overt pulmonary edema. Stable left lower lobe atelectasis. No significant pleural effusion. Bony structures are unremarkable. IMPRESSION: Stable chronic lung disease, interstitial/vascular prominence, left lower lobe atelectasis and cardiac enlargement. Electronically Signed   By: Aletta Edouard M.D.   On: 01/03/2018 15:20      Scheduled Meds: . apixaban  5 mg Oral BID  . atorvastatin  80 mg Oral QHS  . dextromethorphan-guaiFENesin  1 tablet Oral BID  . fluticasone furoate-vilanterol  1 puff Inhalation Daily  . folic acid  1 mg Oral Daily  . insulin aspart  0-9 Units Subcutaneous TID WC  . insulin glargine  5 Units Subcutaneous Daily  . levalbuterol  0.63 mg Nebulization TID  . levothyroxine  100 mcg Oral QAC breakfast  . macitentan  10 mg Oral Daily  . metoprolol tartrate  25 mg Oral BID  . multivitamin  1 tablet Oral Daily  . multivitamin with  minerals  1 tablet Oral Daily  . pantoprazole  40 mg Oral Daily  . predniSONE  40 mg Oral Q breakfast  . ranolazine  500 mg Oral BID  . sildenafil  40 mg Oral TID  . sodium chloride flush  10-40 mL Intracatheter Q12H  . spironolactone  25 mg Oral Daily  . sulfaSALAzine  1,000 mg Oral BID WC  . tiotropium  18 mcg Inhalation Daily  . torsemide  20 mg Oral BID   Continuous Infusions:    LOS: 8 days    Time spent in minutes: 35    Debbe Odea, MD [facility] Pager: [email or web address] Password [email or web address] 01/05/2018, 12:25 PM

## 2018-01-05 NOTE — Progress Notes (Signed)
PT Cancellation Note  Patient Details Name: [name] MRN: [id number] DOB: 01-21-1941   Cancelled Treatment:    Reason Eval/Treat Not Completed: Patient at procedure or test/unavailable.  Pt in nuclear medicine on arrival.  Will see later today as able. 01/05/2018  Donnella Sham, [place] [phone]  (pager)   [name] 01/05/2018, 12:51 PM

## 2018-01-05 NOTE — Clinical Social Work Note (Addendum)
Clinical Social Work Assessment  Patient Details  Name: [name] MRN: [id number] Date of Birth: [birth year]  Date of referral:  01/05/18               Reason for consult:  Facility Placement                Permission sought to share information with:  Facility Art therapist granted to share information::  Yes, Verbal Permission Granted  Name::        Agency::  SNFs  Relationship::     Contact Information:     Housing/Transportation Living arrangements for the past 2 months:  Single Family Home Source of Information:  Patient Patient Interpreter Needed:  None Criminal Activity/Legal Involvement Pertinent to Current Situation/Hospitalization:  No - Comment as needed Significant Relationships:  Other Family Members, Adult Children Lives with:  Self Do you feel safe going back to the place where you live?  Yes Need for family participation in patient care:  No (Coment)  Care giving concerns: Patient from home alone. PT recommending home health, but patient not seen since 12/31/17. PT pending for update on progress. Patient requesting SNF.   Social Worker assessment / plan: CSW met with patient at bedside. Patient indicated she lives at home alone and feels much weaker than when she was first admitted. Patient feels she needs short term SNF before returning home; patient does not feel safe going home in current condition. Patient prefers [facility]. Initial PT eval and progress notes recommended home health PT. PT update pending. CSW to follow and support with disposition planning. CSW will send out initial referrals and await updated PT recs. Will provide SNF bed offers to patient when available.  Employment status:  Retired Research officer, political party) PT Recommendations:  Home with [place] / Referral to community resources:  [place]  Patient/Family's Response to care: Patient appreciative of  care.  Patient/Family's Understanding of and Emotional Response to Diagnosis, Current Treatment, and Prognosis: Patient with understanding of condition. Patient feeling weak and hopeful for ST SNF placement.  Emotional Assessment Appearance:  Appears stated age Attitude/Demeanor/Rapport:  Engaged Affect (typically observed):  Calm, Pleasant Orientation:  Oriented to Self, Oriented to Place, Oriented to  Time, Oriented to Situation Alcohol / Substance use:  Not Applicable Psych involvement (Current and /or in the community):  No (Comment)  Discharge Needs  Concerns to be addressed:  Discharge Planning Concerns, Care Coordination Readmission within the last 30 days:  No Current discharge risk:  Physical Impairment Barriers to Discharge:  Continued Medical Work up   Estanislado Emms, LCSW 01/05/2018, 4:04 PM

## 2018-01-05 NOTE — Progress Notes (Signed)
Physical Therapy Treatment Patient Details Name: [name] MRN: [id number] DOB: 1941/05/13 Today's Date: 01/05/2018    History of Present Illness Pt is a 77 y.o. female admitted 12/25/17 with weakness and LE edema; worked up for CHF exacerbation. PMH includes DMII, HTN, a-fib, OSA, CAD, CHF, CKD III, gout, pacemaker.    PT Comments    Pt making slow improvements, but presents weak and deconditioned.  Now unsteady standing up and unable to safely control the rollator with stability assist.  Pt will not be safe to return home until she has had some rehab for strength and conditioning.   Follow Up Recommendations  SNF;Supervision/Assistance - 24 hour     Equipment Recommendations  None recommended by PT    Recommendations for Other Services       Precautions / Restrictions Precautions Precautions: Fall    Mobility  Bed Mobility               General bed mobility comments: pt sitting EOB on arrival  Transfers Overall transfer level: Needs assistance Equipment used: None Transfers: Sit to/from Stand Sit to Stand: Min assist         General transfer comment: cues for hand placement, on bed or when using the rollator.  Steady assist  Ambulation/Gait Ambulation/Gait assistance: Min assist Ambulation Distance (Feet): 30 Feet(x2) Assistive device: 4-wheeled walker Gait Pattern/deviations: Step-through pattern;Trunk flexed   Gait velocity interpretation: Below normal speed for age/gender General Gait Details: pt unable to control the rollator--should could not easily keep it from running away with her or keep it from veering right and left.   Stairs            Wheelchair Mobility    Modified Rankin (Stroke Patients Only)       Balance     Sitting balance-Leahy Scale: Good       Standing balance-Leahy Scale: Poor                              Cognition Arousal/Alertness: Awake/alert Behavior During Therapy: Flat affect Overall  Cognitive Status: Within Functional Limits for tasks assessed                                        Exercises      General Comments General comments (skin integrity, edema, etc.): Sats on 3L maintained in low 90's during the short distance gait, but pt visibly fatigued at 25-30 feet with mildly dyspneic.      Pertinent Vitals/Pain Pain Assessment: Faces Faces Pain Scale: Hurts a little bit Pain Location: vague locations Pain Descriptors / Indicators: Grimacing;Discomfort Pain Intervention(s): Monitored during session    Home Living                      Prior Function            PT Goals (current goals can now be found in the care plan section) Acute Rehab PT Goals Patient Stated Goal: Less pain PT Goal Formulation: With patient Time For Goal Achievement: 01/10/2018 Progress towards PT goals: Progressing toward goals    Frequency    Min 3X/week      PT Plan Current plan remains appropriate    Co-evaluation              AM-PAC PT "6 Clicks" Daily Activity  Outcome  Measure  Difficulty turning over in bed (including adjusting bedclothes, sheets and blankets)?: A Little Difficulty moving from lying on back to sitting on the side of the bed? : A Little Difficulty sitting down on and standing up from a chair with arms (e.g., wheelchair, bedside commode, etc,.)?: Unable Help needed moving to and from a bed to chair (including a wheelchair)?: A Little Help needed walking in hospital room?: A Little Help needed climbing 3-5 steps with a railing? : A Lot 6 Click Score: 15    End of Session   Activity Tolerance: Patient limited by pain Patient left: in bed;with call bell/phone within reach;Other (comment)(sitting EOB to eat dinner) Nurse Communication: Mobility status PT Visit Diagnosis: Other abnormalities of gait and mobility (R26.89)     Time: 1650-1716 PT Time Calculation (min) (ACUTE ONLY): 26 min  Charges:  $Gait Training: 8-22  mins $Therapeutic Activity: 8-22 mins                    G Codes:       Feb 02, 2018  [name], PT [phone]  (pager)   [name] 02-02-18, 5:27 PM

## 2018-01-05 NOTE — Progress Notes (Addendum)
By this evening, she has had 4 bloody stools with bright red and black blood per RN. Holding Eliquis. Last dose was last night. Stop diuretics to prevent dehydration. Check CBC Q12. Start IV Protonix. Cont to follow in SDU. Not an endoscopy candidate at this time due to acute bronchitis and hypoxia.

## 2018-01-05 NOTE — Progress Notes (Addendum)
Advanced Heart Failure Rounding Note  PCP-Cardiologist: No primary care provider on file.   Subjective:    Low grade temp 99.3 yesterday am. BCX, UA, CXR and flu swab all negative. However respiratory + for rhinovirus .WBC 15.7-> 12.7 -> 11.0.   Requiring increased O2 to 4L overnight. Minimal UOP (-370) with PO torsemide (only received 1 dose). Weight up another 1 lb. Creatinine unchanged 1.49. CVP 12.   Feels more SOB Very weak. Frustrated,  No CP or dizziness. No orthopnea or PND. Decreased appetite over the last 2 days. Not getting OOB much.   Echo 12/30/17:   - Left ventricle: Septal flattening indicative of elevated PA   pressures. Wall thickness was increased in a pattern of moderate   LVH. Systolic function was normal. The estimated ejection   fraction was in the range of 55% to 60%. The study is not   technically sufficient to allow evaluation of LV diastolic   function. - Mitral valve: There was mild regurgitation. - Left atrium: The atrium was severely dilated. - Right ventricle: The cavity size was moderately dilated. - Right atrium: The atrium was severely dilated. - Atrial septum: No defect or patent foramen ovale was identified. - Tricuspid valve: There was moderate regurgitation. - Pulmonary arteries: PA peak pressure: 58 mm Hg (S).  Objective:   Weight Range: 199 lb 12.8 oz (90.6 kg) Body mass index is 28.67 kg/m.   Vital Signs:   Temp:  [97.7 F (36.5 C)-99.3 F (37.4 C)] 97.7 F (36.5 C) (04/08 0531) Pulse Rate:  [68-104] 91 (04/08 0634) Resp:  [16-18] 18 (04/08 0531) BP: (100-117)/(54-82) 117/82 (04/08 0531) SpO2:  [85 %-97 %] 95 % (04/08 0634) Weight:  [199 lb 12.8 oz (90.6 kg)] 199 lb 12.8 oz (90.6 kg) (04/08 0531) Last BM Date: 01/04/18  Weight change: Filed Weights   01/03/18 0637 01/04/18 0527 01/05/18 0531  Weight: 197 lb (89.4 kg) 198 lb 11.2 oz (90.1 kg) 199 lb 12.8 oz (90.6 kg)    Intake/Output:   Intake/Output Summary (Last 24  hours) at 01/05/2018 0742 Last data filed at 01/04/2018 1926 Gross per 24 hour  Intake 480 ml  Output 850 ml  Net -370 ml      Physical Exam   CVP: 12  General: elderly. No resp difficulty. HEENT: Normal anicteric Neck: Supple. JVP 12. Carotids 2+ bilat; no bruits. No thyromegaly or nodule noted. Cor: PMI nondisplaced. IRR,  2/6 TR Lungs: diminished bases Abdomen: obese, soft, non-tender, non-distended, no HSM. No bruits or masses. +BS  Extremities: No cyanosis, clubbing, or rash. R and LLE trace-1+ edema with BLE thigh high TED hose on. RUE PICC in place.  Neuro: Alert & orientedx3, cranial nerves grossly intact. moves all 4 extremities w/o difficulty. Affect frustrated   Telemetry   Afib 90s with IVCD. Personally reviewed.   EKG    No new tracings.   Labs    CBC Recent Labs    01/04/18 0500 01/05/18 0440  WBC 12.7* 11.0*  HGB 10.0* 9.5*  HCT 34.8* 32.5*  MCV 89.9 89.3  PLT 290 409   Basic Metabolic Panel Recent Labs    01/03/18 0434 01/04/18 0500 01/05/18 0440  NA 132* 129* 129*  K 4.6 4.2 4.0  CL 80* 81* 81*  CO2 36* 35* 34*  GLUCOSE 118* 96 149*  BUN 40* 45* 55*  CREATININE 1.35* 1.53* 1.49*  CALCIUM 9.0 8.0* 7.9*  MG 2.5*  --   --    Liver  Function Tests No results for input(s): AST, ALT, ALKPHOS, BILITOT, PROT, ALBUMIN in the last 72 hours. No results for input(s): LIPASE, AMYLASE in the last 72 hours. Cardiac Enzymes No results for input(s): CKTOTAL, CKMB, CKMBINDEX, TROPONINI in the last 72 hours.  BNP: BNP (last 3 results) Recent Labs    12/25/17 1737  BNP 2,089.7*    ProBNP (last 3 results) No results for input(s): PROBNP in the last 8760 hours.   D-Dimer No results for input(s): DDIMER in the last 72 hours. Hemoglobin A1C No results for input(s): HGBA1C in the last 72 hours. Fasting Lipid Panel No results for input(s): CHOL, HDL, LDLCALC, TRIG, CHOLHDL, LDLDIRECT in the last 72 hours. Thyroid Function Tests No results for  input(s): TSH, T4TOTAL, T3FREE, THYROIDAB in the last 72 hours.  Invalid input(s): FREET3  Other results:   Imaging    No results found.   Medications:     Scheduled Medications: . apixaban  5 mg Oral BID  . atorvastatin  80 mg Oral QHS  . fluticasone furoate-vilanterol  1 puff Inhalation Daily  . folic acid  1 mg Oral Daily  . insulin aspart  0-9 Units Subcutaneous TID WC  . insulin glargine  5 Units Subcutaneous Daily  . levalbuterol  0.63 mg Nebulization TID  . levothyroxine  100 mcg Oral QAC breakfast  . macitentan  10 mg Oral Daily  . metoprolol tartrate  25 mg Oral BID  . multivitamin  1 tablet Oral Daily  . multivitamin with minerals  1 tablet Oral Daily  . pantoprazole  40 mg Oral Daily  . predniSONE  40 mg Oral Q breakfast  . ranolazine  500 mg Oral BID  . sildenafil  40 mg Oral TID  . sodium chloride flush  10-40 mL Intracatheter Q12H  . spironolactone  25 mg Oral Daily  . sulfaSALAzine  1,000 mg Oral BID WC  . tiotropium  18 mcg Inhalation Daily  . torsemide  20 mg Oral BID    Infusions:   PRN Medications: acetaminophen, albuterol, ondansetron (ZOFRAN) IV, phenol, sodium chloride flush    Patient Profile  [name] is a 77 year old with history of Diabetes, HTN, hyperlipidemia, hypothyroidism, obesity, tachy/brady s/p PPM, COPD, GERD, Af ib, CAD, and pulmonary hypertension.   Admitted with increased dyspnea.   Assessment/Plan   1. 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  - Weight down 10 pounds total.  CVP 12. WIth RV failure this may be as dry as we can get her. Her weight is significantly down from DC weight in April 2017 (254 lbs), but today she is slightly more symptomatic.  - Restarted torsemide 20 mg BID yesterday (she only  received one dose). Will see how she does with both doses today. May need IV lasix or metolazone if she does not have adequate response.  - Continue spiro 25 mg daily - Co-ox inaccurate - 75% this am.  - PYP scan to evaluate for amyloid ordered for this am.   2. 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 -Out of macitentan about 10 days. Restarted on admit.  - On combination therapy. Macitentan 10 mg daily + sildenafil 20 mg three times a day. On chronic oxygen. No change.   3. COPD - On chronic  oxygen (night O2 at home). Increase O2 requirement overnight to 4L   4. [name]- with medtronic PPM  5. Permanent A fib  - Rate improved to 90s with restarting lopressor - Continue apixaban   6. CAD  Cath 2013 with LAD 70-75% and ostial 95% D2.  - No s/s ischemia - On statin and eliquis   7. DM2 - On insulin per Primary. No change - Consider Jardiance  8. CKD Stage III, baseline creatinine 1.3-1.6 - Creatinine 1.5 this am.  - BMET daily  9. Deconditioing - PT recommending [facility] - Encouraged her to get OOB. No change.   10. Hypokalemia - K 4.0 today   11.Leukocytosis - Continues to improve. - CXR, UA and bcx remaining negative. Flu swab negative. Resp panel + for rhinovirus.  - Continue incentive spirometer  12. Hyponatremia - Na 129. May need tolvaptan if worsens.  - Monitor daily BMET.  - Free water restrict- very little PO intake charted. On 1200 ml fluid restriction.    Dispo: Overall improved. HR and fluid status improved. Still weak.  Needs to ambulate more. Hopefully home 24-48 hours at discretion of primary team.    Length of Stay: Pulpotio Bareas, NP  01/05/2018, 7:42 AM  Advanced Heart Failure Team Pager [phone] (M-F; 7a - 4p)  Please contact [facility] for night-coverage after hours (4p -7a ) and weekends on [email or web address]  Patient seen and examined with the above-signed Advanced Practice Provider and/or Housestaff. I personally reviewed  laboratory data, imaging studies and relevant notes. I independently examined the patient and formulated the important aspects of the plan. I have edited the note to reflect any of my changes or salient points. I have personally discussed the plan with the patient and/or family.  Continues to feel poorly. Volume status improved but still with mild volume overload. Respiratory panel + for acute rhinovirus which likely explains some of her malaise. PYP scan reviewed personally and negative for TTR amyloid. AF rate improved with adding back b-blocker. Await PT eval. Suspect she will need SNF - not sure she will agree.   Glori Bickers, MD  9:05 PM

## 2018-01-05 NOTE — Progress Notes (Signed)
Pt oxygen saturation drops to 84% at night. Pt requiring at least 4L [place] to sustained an above 90% saturation. Will monitor.

## 2018-01-05 NOTE — NC FL2 (Signed)
[place] LEVEL OF CARE SCREENING TOOL     IDENTIFICATION  Patient Name: NADEZHDA POLLITT Birthdate: 19-Feb-1941 Sex: female Admission Date (Current Location): 12/25/2017  [facility] and [place] Number:  Herbalist and Address:  [facility], [place] [address] [place], [place] [address]      Provider Number: [id number]  Attending Physician Name and Address:  Debbe Odea, MD  Relative Name and Phone Number:  Griselda Miner, Daughter, [phone]     Current Level of Care: Hospital Recommended Level of Care: [place] Prior Approval Number:    Date Approved/Denied:   PASRR Number: [id number]  Discharge Plan: SNF    Current Diagnoses: Patient Active Problem List   Diagnosis Date Noted  . Acute bronchitis due to Rhinovirus 01/05/2018  . Leukocytosis   . PICC (peripherally inserted central catheter) in place   . Respiratory crackles at left lung base   . Cough   . Frontal headache   . Generalized weakness 12/29/2017  . Acute on chronic right-sided congestive heart failure ([place]) 12/28/2017  . Anemia 12/25/2017  . Hypokalemia   . Chronic atrial fibrillation ([place])   . Pulmonary hypertension ([place]), RHC on 01/09/2016 01/09/2016  . RVF (right ventricular failure) ([place])   . UTI (lower urinary tract infection) 10/27/2015  . AKI (acute kidney injury) ([place]) 10/16/2015  . Hypomagnesemia 10/13/2015  . Malnutrition of moderate degree 10/11/2015  . Hypocalcemia 10/10/2015  . Fever 03/28/2015  . Near syncope 03/28/2015  . Uncomplicated asthma 22/97/9892  . CKD (chronic kidney disease) stage 3, GFR 30-59 ml/min (HCC) 03/13/2015  . Chronic obstructive pulmonary disease ([place]) 03/13/2015  . Crohn's disease of large intestine without complication ([place]) 11/94/1740  . Gastro-esophageal reflux disease without esophagitis 03/13/2015  . Personal history of infectious and parasitic disease 03/13/2015  . Gout 03/13/2015  .  Hypertensive heart disease with CHF (congestive heart failure) ([place]) 03/13/2015  . Hypothyroidism 03/13/2015  . Low back pain 03/13/2015  . Extreme obesity 03/13/2015  . Paroxysmal atrial fibrillation ([place]) 03/13/2015  . Osteopenia 03/13/2015  . Primary hyperparathyroidism ([place]) 03/13/2015  . Secondary polycythemia 03/13/2015  . Sleep apnea 03/13/2015  . Type 2 diabetes mellitus with diabetic nephropathy ([place]) 03/13/2015  . OSA (obstructive sleep apnea) 02/18/2015  . Pulmonary hypertension due to sleep-disordered breathing ([place]) 02/18/2015  . Hyperparathyroidism, primary ([place]) 01/30/2015  . Obesity 11/10/2014  . Ischemia of hand 08/15/2014  . Morbid obesity with BMI of 40.0-44.9, adult ([place]) 06/22/2012  . Coronary artery disease 06/15/2012  . Long term current use of anticoagulant therapy 01/11/2011  . CARDIAC PACEMAKER IN SITU-MEDTRONIC ADAPTA L 01/15/2010  . DIASTOLIC HEART FAILURE, CHRONIC 12/22/2008  . DYSLIPIDEMIA 09/05/2008  . OBESITY-MORBID (>100') 09/05/2008  . OTHER OSTEOPOROSIS 09/05/2008  . CROHN'S DISEASE, HX OF 09/05/2008  . COPD with emphysema ([place]) 01/02/2008  . HYPERSOMNIA 12/21/2007  . ANXIETY 12/18/2007  . DEPRESSION 12/18/2007  . Atrial fibrillation ([place]) 12/18/2007    Orientation RESPIRATION BLADDER Height & Weight     Self, Time, Situation, Place  O2(nasal cannula 2L) Continent Weight: 199 lb 12.8 oz (90.6 kg) Height:  5\' 10"  (177.8 cm)  BEHAVIORAL SYMPTOMS/MOOD NEUROLOGICAL BOWEL NUTRITION STATUS      Continent Diet(please see DC summary)  AMBULATORY STATUS COMMUNICATION OF NEEDS Skin   Limited Assist Verbally Normal                       Personal Care Assistance Level of Assistance  Bathing, Feeding, Dressing Bathing Assistance: Limited assistance Feeding assistance: Independent Dressing Assistance: Limited assistance     Functional Limitations Info  Sight, Hearing, Speech Sight Info: Adequate Hearing Info: Adequate Speech Info: Adequate     SPECIAL CARE FACTORS FREQUENCY  PT (By licensed PT)     PT Frequency: 5x/week              Contractures Contractures Info: Not present    Additional Factors Info  Code Status, Allergies, Insulin Sliding Scale Code Status Info: Full Allergies Info: No Known Allergies   Insulin Sliding Scale Info: novolog 3x/day with meals; lantus daily       Current Medications (01/05/2018):  This is the current hospital active medication list Current Facility-Administered Medications  Medication Dose Route Frequency Provider Last Rate Last Dose  . acetaminophen (TYLENOL) tablet 650 mg  650 mg Oral Q6H PRN Debbe Odea, MD   650 mg at 01/04/18 2128  . apixaban (ELIQUIS) tablet 5 mg  5 mg Oral BID Jani [place]vel, MD   Stopped at 01/05/18 1245  . atorvastatin (LIPITOR) tablet 80 mg  80 mg Oral Loma Sousa, MD   80 mg at 01/04/18 2121  . dextromethorphan-guaiFENesin (MUCINEX DM) 30-600 MG per 12 hr tablet 1 tablet  1 tablet Oral BID Debbe Odea, MD   1 tablet at 01/05/18 1129  . fluticasone furoate-vilanterol (BREO ELLIPTA) 200-25 MCG/INH 1 puff  1 puff Inhalation Daily Jani Gravel, MD   1 puff at 40/98/11 9147  . folic acid (FOLVITE) tablet 1 mg  1 mg Oral Daily Jani Gravel, MD   1 mg at 01/05/18 0853  . insulin aspart (novoLOG) injection 0-9 Units  0-9 Units Subcutaneous TID WC Lavina Hamman, MD   1 Units at 01/05/18 1359  . insulin glargine (LANTUS) injection 5 Units  5 Units Subcutaneous Daily Rama, Venetia Maxon, MD   5 Units at 01/05/18 0853  . levalbuterol (XOPENEX) nebulizer solution 0.63 mg  0.63 mg Nebulization Q6H PRN Debbe Odea, MD      . levothyroxine (SYNTHROID, LEVOTHROID) tablet 100 mcg  100 mcg Oral QAC breakfast Lavina Hamman, MD   100 mcg at 01/05/18 8295  . macitentan (OPSUMIT) tablet 10 mg  10 mg Oral Daily Jani Gravel, MD   10 mg at 01/05/18 0854  . metoprolol tartrate (LOPRESSOR) tablet 25 mg  25 mg Oral BID Bensimhon, Shaune Pascal, MD   25 mg at 01/05/18 6213  .  multivitamin (PROSIGHT) tablet 1 tablet  1 tablet Oral Daily Jani Gravel, MD   1 tablet at 01/05/18 0855  . multivitamin with minerals tablet 1 tablet  1 tablet Oral Daily Jani Gravel, MD   1 tablet at 01/05/18 0850  . ondansetron (ZOFRAN) injection 4 mg  4 mg Intravenous Q6H PRN Debbe Odea, MD   4 mg at 01/03/18 1209  . pantoprazole (PROTONIX) EC tablet 40 mg  40 mg Oral Daily Jani Gravel, MD   40 mg at 01/05/18 0850  . phenol (CHLORASEPTIC) mouth spray 1 spray  1 spray Mouth/Throat PRN Norval Morton, MD   1 spray at 01/02/18 0657  . predniSONE (DELTASONE) tablet 40 mg  40 mg Oral Q breakfast Debbe Odea, MD   40 mg at 01/05/18 0852  . ranolazine (RANEXA) 12 hr tablet 500 mg  500 mg Oral BID Jani Gravel, MD   500 mg at 01/05/18 0865  . sildenafil (REVATIO) tablet 40 mg  40 mg Oral TID Jani Gravel, MD  40 mg at 01/05/18 0851  . sodium chloride flush (NS) 0.9 % injection 10-40 mL  10-40 mL Intracatheter Q12H Debbe Odea, MD   10 mL at 01/05/18 0855  . sodium chloride flush (NS) 0.9 % injection 10-40 mL  10-40 mL Intracatheter PRN Debbe Odea, MD      . spironolactone (ALDACTONE) tablet 25 mg  25 mg Oral Daily Georgiana Shore, NP   25 mg at 01/05/18 0851  . sulfaSALAzine (AZULFIDINE) tablet 1,000 mg  1,000 mg Oral BID WC Jani Gravel, MD   1,000 mg at 01/05/18 0854  . tiotropium (SPIRIVA) inhalation capsule 18 mcg  18 mcg Inhalation Daily Jani Gravel, MD   18 mcg at 01/05/18 0925  . torsemide (DEMADEX) tablet 20 mg  20 mg Oral BID Bensimhon, Shaune Pascal, MD   20 mg at 01/05/18 9562     Discharge Medications: Please see discharge summary for a list of discharge medications.  Relevant Imaging Results:  Relevant Lab Results:   Additional Information SSN: [id number]  Estanislado Emms, LCSW

## 2018-01-05 NOTE — Progress Notes (Signed)
Pt had bloody stool x4 during this shift.  Also had a nose bleed.  Eliquis held this morning.  Idolina Primer, RN

## 2018-01-05 NOTE — Progress Notes (Signed)
   01/05/18 0634  Vitals  Pulse Rate 91  ECG Heart Rate 94  Oxygen Therapy  SpO2 95 %  O2 Device Nasal Cannula  O2 Flow Rate (L/min) 4 L/min   RN tried to wean pt. back to 2L of oxygen. Pt dropped to 90%. Per pt., "I can already feel its getting harder to breathe." Will monitor.

## 2018-01-06 ENCOUNTER — Encounter (HOSPITAL_COMMUNITY): Payer: Medicare Other | Attending: Internal Medicine

## 2018-01-06 DIAGNOSIS — K921 Melena: Secondary | ICD-10-CM

## 2018-01-06 DIAGNOSIS — J206 Acute bronchitis due to rhinovirus: Secondary | ICD-10-CM

## 2018-01-06 LAB — GLUCOSE, CAPILLARY
GLUCOSE-CAPILLARY: 243 mg/dL — AB (ref 65–99)
Glucose-Capillary: 117 mg/dL — ABNORMAL HIGH (ref 65–99)
Glucose-Capillary: 147 mg/dL — ABNORMAL HIGH (ref 65–99)
Glucose-Capillary: 159 mg/dL — ABNORMAL HIGH (ref 65–99)

## 2018-01-06 LAB — BASIC METABOLIC PANEL
ANION GAP: 14 (ref 5–15)
BUN: 60 mg/dL — ABNORMAL HIGH (ref 6–20)
CHLORIDE: 80 mmol/L — AB (ref 101–111)
CO2: 35 mmol/L — ABNORMAL HIGH (ref 22–32)
Calcium: 7.9 mg/dL — ABNORMAL LOW (ref 8.9–10.3)
Creatinine, Ser: 1.65 mg/dL — ABNORMAL HIGH (ref 0.44–1.00)
GFR calc Af Amer: 33 mL/min — ABNORMAL LOW (ref >60)
GFR, EST NON AFRICAN AMERICAN: 29 mL/min — AB (ref >60)
Glucose, Bld: 101 mg/dL — ABNORMAL HIGH (ref 65–99)
POTASSIUM: 3.7 mmol/L (ref 3.5–5.1)
SODIUM: 129 mmol/L — AB (ref 135–145)

## 2018-01-06 LAB — CBC
HEMATOCRIT: 31.3 % — AB (ref 36.0–46.0)
HEMOGLOBIN: 9.2 g/dL — AB (ref 12.0–15.0)
MCH: 26.1 pg (ref 26.0–34.0)
MCHC: 29.4 g/dL — ABNORMAL LOW (ref 30.0–36.0)
MCV: 88.7 fL (ref 78.0–100.0)
Platelets: 382 10*3/uL (ref 150–400)
RBC: 3.53 MIL/uL — AB (ref 3.87–5.11)
RDW: 26.5 % — ABNORMAL HIGH (ref 11.5–15.5)
WBC: 10.5 10*3/uL (ref 4.0–10.5)

## 2018-01-06 MED ORDER — HYDROCORTISONE ACETATE 25 MG RE SUPP
25.0000 mg | Freq: Three times a day (TID) | RECTAL | Status: DC
  Administered 2018-01-06 – 2018-01-07 (×3): 25 mg via RECTAL
  Filled 2018-01-06 (×10): qty 1

## 2018-01-06 MED ORDER — APIXABAN 5 MG PO TABS
5.0000 mg | ORAL_TABLET | Freq: Two times a day (BID) | ORAL | Status: DC
  Administered 2018-01-06 – 2018-01-08 (×5): 5 mg via ORAL
  Filled 2018-01-06 (×5): qty 1

## 2018-01-06 MED ORDER — FAMOTIDINE 20 MG PO TABS
20.0000 mg | ORAL_TABLET | Freq: Two times a day (BID) | ORAL | Status: DC
  Administered 2018-01-06 – 2018-01-08 (×5): 20 mg via ORAL
  Filled 2018-01-06 (×5): qty 1

## 2018-01-06 NOTE — Progress Notes (Signed)
CSW provided SNF bed offers to patient at bedside. Patient's preferred facility, [facility], has not offered a bed. CSW advised patient to choose another facility. Facility will need to obtain [facility] authorization before patient can admit. CSW to follow and support with discharge planning.  Estanislado Emms, [place]

## 2018-01-06 NOTE — Plan of Care (Signed)
  Problem: Skin Integrity: Goal: Risk for impaired skin integrity will decrease Outcome: Completed/Met  Pt denies pain. Displays no s/s or distress.

## 2018-01-06 NOTE — Progress Notes (Signed)
Advanced Heart Failure Rounding Note  PCP-Cardiologist: No primary care provider on file.   Subjective:    Feels better this am. Less fatigued. Actually smiling.   Had 4 bloody BMs last night. But says just small amount of red blood. Hgb stable. No melena. No further blleding    Respiratory panel + for rhinovirus   Diuretics stopped. Weight down 3 pounds more (total 15 pounds) .Denies SOB  PT has seen and recommended SNF. She is ok with this.   PYP scan negative for TTR amyloid   Echo 12/30/17:   - Left ventricle: Septal flattening indicative of elevated PA   pressures. Wall thickness was increased in a pattern of moderate   LVH. Systolic function was normal. The estimated ejection   fraction was in the range of 55% to 60%. The study is not   technically sufficient to allow evaluation of LV diastolic   function. - Mitral valve: There was mild regurgitation. - Left atrium: The atrium was severely dilated. - Right ventricle: The cavity size was moderately dilated. - Right atrium: The atrium was severely dilated. - Atrial septum: No defect or patent foramen ovale was identified. - Tricuspid valve: There was moderate regurgitation. - Pulmonary arteries: PA peak pressure: 58 mm Hg (S).  Objective:   Weight Range: 89.3 kg (196 lb 12.8 oz) Body mass index is 28.24 kg/m.   Vital Signs:   Temp:  [98 F (36.7 C)-98.7 F (37.1 C)] 98.7 F (37.1 C) (04/09 0500) Pulse Rate:  [91-104] 101 (04/08 2242) Resp:  [16-19] 19 (04/09 0500) BP: (110-126)/(77-96) 117/96 (04/08 2104) SpO2:  [92 %-96 %] 94 % (04/08 2030) Weight:  [89.3 kg (196 lb 12.8 oz)] 89.3 kg (196 lb 12.8 oz) (04/09 0500) Last BM Date: 01/05/18  Weight change: Filed Weights   01/04/18 0527 01/05/18 0531 01/06/18 0500  Weight: 90.1 kg (198 lb 11.2 oz) 90.6 kg (199 lb 12.8 oz) 89.3 kg (196 lb 12.8 oz)    Intake/Output:   Intake/Output Summary (Last 24 hours) at 01/06/2018 0715 Last data filed at 01/05/2018  2200 Gross per 24 hour  Intake 840 ml  Output 1425 ml  Net -585 ml      Physical Exam   General:  Elderly woman lying in bed. AD HEENT: normal Neck: supple. JVP 8-9 Carotids 2+ bilat; no bruits. No lymphadenopathy or thryomegaly appreciated. Cor: PMI nondisplaced. IRR 2/6 TR Lungs: clear Abdomen: obese soft, nontender, nondistended. No hepatosplenomegaly. No bruits or masses. Good bowel sounds. Extremities: no cyanosis, clubbing, rash, TED hose in place trace edema Neuro: alert & orientedx3, cranial nerves grossly intact. moves all 4 extremities w/o difficulty. Affect pleasant   Telemetry   Afib 80-90sPersonally reviewed.   EKG    No new tracings.   Labs    CBC Recent Labs    01/05/18 0440 01/05/18 2251  WBC 11.0* 10.3  NEUTROABS  --  8.6*  HGB 9.5* 9.8*  HCT 32.5* 32.7*  MCV 89.3 89.3  PLT 297 629   Basic Metabolic Panel Recent Labs    01/04/18 0500 01/05/18 0440  NA 129* 129*  K 4.2 4.0  CL 81* 81*  CO2 35* 34*  GLUCOSE 96 149*  BUN 45* 55*  CREATININE 1.53* 1.49*  CALCIUM 8.0* 7.9*   Liver Function Tests No results for input(s): AST, ALT, ALKPHOS, BILITOT, PROT, ALBUMIN in the last 72 hours. No results for input(s): LIPASE, AMYLASE in the last 72 hours. Cardiac Enzymes No results for input(s): CKTOTAL,  CKMB, CKMBINDEX, TROPONINI in the last 72 hours.  BNP: BNP (last 3 results) Recent Labs    12/25/17 1737  BNP 2,089.7*    ProBNP (last 3 results) No results for input(s): PROBNP in the last 8760 hours.   D-Dimer No results for input(s): DDIMER in the last 72 hours. Hemoglobin A1C No results for input(s): HGBA1C in the last 72 hours. Fasting Lipid Panel No results for input(s): CHOL, HDL, LDLCALC, TRIG, CHOLHDL, LDLDIRECT in the last 72 hours. Thyroid Function Tests No results for input(s): TSH, T4TOTAL, T3FREE, THYROIDAB in the last 72 hours.  Invalid input(s): FREET3  Other results:   Imaging    Nm Tumor Localization W  Spect  Result Date: 01/05/2018 CLINICAL DATA:  HEART FAILURE. CONCERN FOR CARDIAC AMYLOIDOSIS. EXAM: NUCLEAR MEDICINE TUMOR LOCALIZATION. PYP CARDIAC AMYLOIDOSIS SCAN WITH SPECT TECHNIQUE: Following intravenous administration of radiopharmaceutical, anterior planar images of the chest were obtained. Regions of interest were placed on the heart and contralateral chest wall for quantitative assessment. Additional SPECT imaging of the chest was obtained. RADIOPHARMACEUTICALS:  20.8 mCi TECHNETIUM 99 PYROPHOSPHATE FINDINGS: Planar Visual assessment: Anterior planar imaging demonstrates radiotracer uptake within the heart less than than uptake within the adjacent ribs (Grade 1). Quantitative assessment : Quantitative assessment of the cardiac uptake compared to the contralateral chest wall is equal to 1.28 (H/CL = 1.28). SPECT assessment: SPECT imaging of the chest demonstrates minimal radiotracer accumulation within the LEFT ventricle. IMPRESSION: Visual and quantitative assessment (grade 1, H/CL equal 1.28) are not suggestive of transthyretin amyloidosis. Electronically Signed   By: Suzy Bouchard M.D.   On: 01/05/2018 14:58     Medications:     Scheduled Medications: . atorvastatin  80 mg Oral QHS  . dextromethorphan-guaiFENesin  1 tablet Oral BID  . fluticasone furoate-vilanterol  1 puff Inhalation Daily  . folic acid  1 mg Oral Daily  . insulin aspart  0-9 Units Subcutaneous TID WC  . insulin glargine  5 Units Subcutaneous Daily  . levothyroxine  100 mcg Oral QAC breakfast  . macitentan  10 mg Oral Daily  . metoprolol tartrate  25 mg Oral BID  . multivitamin  1 tablet Oral Daily  . multivitamin with minerals  1 tablet Oral Daily  . pantoprazole (PROTONIX) IV  40 mg Intravenous Q12H  . predniSONE  40 mg Oral Q breakfast  . ranolazine  500 mg Oral BID  . sildenafil  40 mg Oral TID  . sodium chloride flush  10-40 mL Intracatheter Q12H  . sulfaSALAzine  1,000 mg Oral BID WC  . tiotropium  18  mcg Inhalation Daily    Infusions:   PRN Medications: acetaminophen, levalbuterol, ondansetron (ZOFRAN) IV, phenol, sodium chloride flush    Patient Profile  [name] is a 77 year old with history of Diabetes, HTN, hyperlipidemia, hypothyroidism, obesity, tachy/brady s/p PPM, COPD, GERD, Af ib, CAD, and pulmonary hypertension.   Admitted with increased dyspnea.   Assessment/Plan   1. 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 Creatinine up slightly today. Will hold diuretics today. Start torsemide 20 bid tomorrow. Adjust as needed .  - PYP scan negative for TTR amyloid  2. 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 -Out of macitentan about 10 days. Restarted on admit.  - On combination therapy. Macitentan  10 mg daily + sildenafil 20 mg three times a day. On chronic oxygen. No change.   3. COPD - On chronic oxygen (night O2 at home). Increase O2 requirement overnight to 4L   4. [name]- with medtronic PPM  5. Permanent A fib  - Rate improved to 90s with restarting lopressor - Hgb stable. Restart apixaban.  6. CAD  Cath 2013 with LAD 70-75% and ostial 95% D2.  - No s/s ischemia - On statin and eliquis   7. DM2 - On insulin per Primary. No change - Consider Jardiance  8. CKD Stage III, baseline creatinine 1.3-1.6 - Creatinine 1.65 this am.  Hold diuretics  - BMET daily  9. Deconditioing - PT recommending SNF. She is ok with this.  - Encouraged her to get OOB. No change.   10. Hypokalemia - K 3.7 today   11.Leukocytosis - Improving  - CXR, UA and bcx remaining negative. Flu swab negative. Resp panel + for rhinovirus.  - Continue incentive spirometer  12. Hyponatremia - Na 129. Stable  Stable from our standpoint. Likely ok for SNF today or  tomorrow.   Length of Stay: [place], MD  01/06/2018, 7:15 AM  Advanced Heart Failure Team Pager [phone] (M-F; [place])  Please contact [facility] for night-coverage after hours (4p -7a ) and weekends on [email or web address]

## 2018-01-06 NOTE — Progress Notes (Signed)
Pt again had a large amount of GI bleed.  She was incontinent this time. States gush jjust came out.  Idolina Primer, RN

## 2018-01-06 NOTE — Progress Notes (Signed)
CSW has coordinated with patient and facilities over the course of the day. [facility] did offer a bed and then rescinded. Patient's second choice, [facility], also rescinded, stating they have filled their beds. Patient's next choice, [facility], does still have a bed available for patient.  Two of patient's medications will need to be brought from home to the facility due to their high cost: Opsumit and Renexa. Patient has Renexa at home but needs to order more Opsumit, as she has run out. Patient's family member will need to pick up these medications once delivered and bring them to the facility.  CSW awaiting [place] to start and receive [facility] authorization for patient. Authorization is needed before patient can admit to the facility, and patient's Opsumit will also need to be delivered.  CSW to follow and support with discharge planning.  Estanislado Emms, [place]

## 2018-01-06 NOTE — Progress Notes (Signed)
Nurse tech attempted to get vital signs and patient refused midnight vital signs to be taken. Will attempt again in the am.

## 2018-01-06 NOTE — Progress Notes (Signed)
PROGRESS NOTE    [name]   BJS:[id number]  DOB: July 16, 1941  DOA: 12/25/2017 PCP: [name], MD   Brief Narrative:  [name]  is a 77 y.o. female who lives alone at home and is medically complicated. She has IDDm2, Hypertension, Hyperlipidemia, Hypothyroidism, OSA, CAD, COPD on nocturnal O2 (2L), Pafib, dCHF (EF 55%), PAH, RV failure and was sent to the ED for worsening dyspnea.   Admitted for acute on chronic combined heart failure. Subsequently developed a COPD flare due to rhinovirus. Yesterday noted to have 4 blood stools. Eliquis held and bleeding stopped. No drop in HB. Eliquis resumed today by cardiology. Follow Hb and stools. Will go to SNF.  Note SW states it will be hard for SNF to cover Macitentan. I have asked her to please discuss this with CHF team.   Subjective: No more blood stools overnight. Ongoing cough and dyspnea. No nausea or vomiting.   Assessment & Plan:   Principal Problem:   Acute on chronic diastolic congestive heart failure, PAH and RV failure - PICC placed and IV Lasix given by CHF team - changed form IV Lasix to Demadex 20 ng BID- also on Aldactone - holding diuretics today - ECHO below shows EF 50-55% and severe dilated atria (left and right) and moderately dialted RV - ran out of Macitentan at home which has been restarted- cont Sildenafil as well per CHF team  Active Problems:   Low grade fever, leukocytosis hypoxia on 4/6    H/o COPD     Acute bronchitis due to rhinovirus - follows with Dr Chase Caller & on 2 O2 at night at home - 4/5 noted to have> frontal headache, vomiting, increased cough, fevers 99 despite taking Tylenol  - supportive care given with Tylenol, Zofran- she is already on daily Protonix -  has PICC line - blood cultures done and found to be neg - Influenza neg - UA negative - had LLL crackles but only LLL atelectasis on CXR   - note Resp virus panel is + for Rhinovirus which explains above symptoms - appears  that she has diuresed well and this hypoxia is likely related to acute bronchitis due to rhinovirus and underlying COPD - 4/7-  She is on Breo Ellipta -  now congested/ wheezing, still hypoxic 88% on room air >  will add Prednisone and Xopenex TID   - start IS and PT for LLL atelectasis - leukocytosis and fevers resolving without antibiotics 4/8 - ongoing hypoxia and congestion> flutter valve, chest PT added - 4/9 - pulm status unchanged- recommend a slow steroid taper  Hypokalemia - due to diuretics- has been replaced  GI bleed - per RN, it was a large amount of blood yesterday, black and red blood mixed with stool- 4 episodes - interestingly Hb did not drop and so I wonder if it was hemorrhoidal - the patient states she has no h/o hemorrhoids and RN did not see any on exam but may be internal- start Anusol suppositories - I did start Protonix BID yesterday and held diuretics yesterday - change Protonix to Pepcid BID (on steroids) - Cardiology has resumed Eliquis today- follow for bleeding    Atrial fibrillation- paroxysmal - cont Eliquis (held yesterday) and Metoplrol   Coronary artery disease - cont Statin and Eliquis    CKD (chronic kidney disease) stage 3, GFR 30-59 ml/min  - following with diuresis- Cr up today   DM2 -   A1c 5.8   - on Lantus  5 U (uses 10 at home) and SSI- sugars reasonably stable  Gout - on PRN Colchicine and nothing for prophylaxis-  Mild anemia - follow- on folic acid-  anemia panel reveals adequate Iron, folate and B12 stores  Crohn's disease - cont Sulfasalazine  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 Wall thickness was increased in a pattern of  moderate   LVH. Systolic function was normal. The estimated ejection   fraction was in the range of 55% to 60%. The study is not   technically sufficient to allow evaluation of LV diastolic   function. - Mitral valve: There was mild regurgitation. - Left atrium: The atrium was severely dilated. - Right ventricle: The cavity size was moderately dilated. - Right atrium: The atrium was severely dilated. - Atrial septum: No defect or patent foramen ovale was identified. - Tricuspid valve: There was moderate regurgitation. - Pulmonary arteries: PA peak pressure: 58 mm Hg (S).  Antimicrobials:  Anti-infectives (From admission, onward)   Start     Dose/Rate Route Frequency Ordered Stop   01/06/18 1000  azithromycin (ZITHROMAX) tablet 250 mg  Status:  Discontinued     250 mg Oral Daily 01/05/18 0904 01/05/18 0905   01/05/18 1000  azithromycin (ZITHROMAX) tablet 500 mg  Status:  Discontinued     500 mg Oral Daily 01/05/18 0904 01/05/18 0905       Objective: Vitals:   01/05/18 2104 01/05/18 2242 01/06/18 0500 01/06/18 0836  BP: (!) 117/96   114/73  Pulse: (!) 101 (!) 101  95  Resp:   19   Temp: 98.6 F (37 C)  98.7 F (37.1 C)   TempSrc: Oral  Oral   SpO2:      Weight:   89.3 kg (196 lb 12.8 oz)   Height:        Intake/Output Summary (Last 24 hours) at 01/06/2018 1250 Last data filed at 01/06/2018 0930 Gross per 24 hour  Intake 960 ml  Output 900 ml  Net 60 ml   Filed Weights   01/04/18 0527 01/05/18 0531 01/06/18 0500  Weight: 90.1 kg (198 lb 11.2 oz) 90.6 kg (199 lb 12.8 oz) 89.3 kg (196 lb 12.8 oz)    Examination: General exam: Appears comfortable  HEENT: PERRLA, oral mucosa moist, no sclera icterus or thrush Respiratory system: continues to have crackles in LLL-mild rhonchi and congested cough- Respiratory effort normal.   Cardiovascular system: S1 & S2 heard, IIRR  Gastrointestinal system: Abdomen soft, non-tender, nondistended. Normal bowel sound. No  organomegaly Central nervous system: Alert and oriented. No focal neurological deficits. Extremities: No cyanosis, clubbing no edema Skin: No rashes or ulcers    Data Reviewed: I have personally reviewed following labs and imaging studies  CBC: Recent Labs  Lab 01/03/18 0434 01/04/18 0500 01/05/18 0440 01/05/18 2251 01/06/18 0601  WBC 15.7* 12.7* 11.0* 10.3 10.5  NEUTROABS  --   --   --  8.6*  --   HGB 11.8* 10.0* 9.5* 9.8* 9.2*  HCT 39.7 34.8* 32.5* 32.7* 31.3*  MCV 90.6 89.9 89.3 89.3 88.7  PLT 369 290 297 376 696   Basic Metabolic Panel: Recent Labs  Lab 12/31/17 0526  01/02/18 0419 01/03/18 0434 01/04/18  0500 01/05/18 0440 01/06/18 0601  NA 134*   < > 133* 132* 129* 129* 129*  K 3.8   < > 3.0* 4.6 4.2 4.0 3.7  CL 89*   < > 83* 80* 81* 81* 80*  CO2 30   < > 34* 36* 35* 34* 35*  GLUCOSE 129*   < > 126* 118* 96 149* 101*  BUN 43*   < > 44* 40* 45* 55* 60*  CREATININE 1.45*   < > 1.32* 1.35* 1.53* 1.49* 1.65*  CALCIUM 8.4*   < > 8.6* 9.0 8.0* 7.9* 7.9*  MG 2.1  --  1.8 2.5*  --   --   --    < > = values in this interval not displayed.   GFR: Estimated Creatinine Clearance: 34.6 mL/min (A) (by C-G formula based on SCr of 1.65 mg/dL (H)). Liver Function Tests: No results for input(s): AST, ALT, ALKPHOS, BILITOT, PROT, ALBUMIN in the last 168 hours. No results for input(s): LIPASE, AMYLASE in the last 168 hours. No results for input(s): AMMONIA in the last 168 hours. Coagulation Profile: No results for input(s): INR, PROTIME in the last 168 hours. Cardiac Enzymes: No results for input(s): CKTOTAL, CKMB, CKMBINDEX, TROPONINI in the last 168 hours. BNP (last 3 results) No results for input(s): PROBNP in the last 8760 hours. HbA1C: No results for input(s): HGBA1C in the last 72 hours. CBG: Recent Labs  Lab 01/05/18 1131 01/05/18 1714 01/05/18 2108 01/06/18 0756 01/06/18 1159  GLUCAP 140* 185* 139* 117* 147*   Lipid Profile: No results for input(s): CHOL,  HDL, LDLCALC, TRIG, CHOLHDL, LDLDIRECT in the last 72 hours. Thyroid Function Tests: No results for input(s): TSH, T4TOTAL, FREET4, T3FREE, THYROIDAB in the last 72 hours. Anemia Panel: No results for input(s): VITAMINB12, FOLATE, FERRITIN, TIBC, IRON, RETICCTPCT in the last 72 hours. Urine analysis:    Component Value Date/Time   COLORURINE AMBER (A) 01/03/2018 1220   APPEARANCEUR CLEAR 01/03/2018 1220   LABSPEC 1.012 01/03/2018 1220   PHURINE 5.0 01/03/2018 1220   GLUCOSEU NEGATIVE 01/03/2018 1220   GLUCOSEU NEGATIVE 12/08/2007 1621   HGBUR SMALL (A) 01/03/2018 1220   BILIRUBINUR NEGATIVE 01/03/2018 1220   KETONESUR NEGATIVE 01/03/2018 1220   PROTEINUR 100 (A) 01/03/2018 1220   UROBILINOGEN 1.0 03/28/2015 1819   NITRITE NEGATIVE 01/03/2018 1220   LEUKOCYTESUR NEGATIVE 01/03/2018 1220   Sepsis Labs: @LABRCNTIP (procalcitonin:4,lacticidven:4) ) Recent Results (from the past 240 hour(s))  MRSA PCR Screening     Status: None   Collection Time: 12/31/17  9:13 PM  Result Value Ref Range Status   MRSA by PCR NEGATIVE NEGATIVE Final    Comment:        The GeneXpert MRSA Assay (FDA approved for NASAL specimens only), is one component of a comprehensive MRSA colonization surveillance program. It is not intended to diagnose MRSA infection nor to guide or monitor treatment for MRSA infections. Performed at [facility], [place] [address], [place], [place] [address]   Culture, blood (routine x 2)     Status: None (Preliminary result)   Collection Time: 01/03/18 10:00 AM  Result Value Ref Range Status   Specimen Description BLOOD LEFT WRIST  Final   Special Requests   Final    BOTTLES DRAWN AEROBIC ONLY Blood Culture adequate volume   Culture   Final    NO GROWTH 3 DAYS Performed at [facility], [place] [address], [place], [place] [address]    Report Status PENDING  Incomplete  Culture, blood (routine x 2)     Status: None (Preliminary result)   Collection Time:  01/03/18 10:10 AM  Result Value Ref Range Status   Specimen Description BLOOD LEFT HAND  Final   Special Requests   Final    BOTTLES DRAWN AEROBIC ONLY Blood Culture adequate volume   Culture   Final    NO GROWTH 3 DAYS Performed at [facility], [address] [place], [place] [address]    Report Status PENDING  Incomplete  Respiratory Panel by PCR     Status: Abnormal   Collection Time: 01/03/18  6:44 PM  Result Value Ref Range Status   Adenovirus NOT DETECTED NOT DETECTED Final   Coronavirus 229E NOT DETECTED NOT DETECTED Final   Coronavirus HKU1 NOT DETECTED NOT DETECTED Final   Coronavirus NL63 NOT DETECTED NOT DETECTED Final   Coronavirus OC43 NOT DETECTED NOT DETECTED Final   Metapneumovirus NOT DETECTED NOT DETECTED Final   Rhinovirus / Enterovirus DETECTED (A) NOT DETECTED Final   Influenza A NOT DETECTED NOT DETECTED Final   Influenza B NOT DETECTED NOT DETECTED Final   Parainfluenza Virus 1 NOT DETECTED NOT DETECTED Final   Parainfluenza Virus 2 NOT DETECTED NOT DETECTED Final   Parainfluenza Virus 3 NOT DETECTED NOT DETECTED Final   Parainfluenza Virus 4 NOT DETECTED NOT DETECTED Final   Respiratory Syncytial Virus NOT DETECTED NOT DETECTED Final   Bordetella pertussis NOT DETECTED NOT DETECTED Final   Chlamydophila pneumoniae NOT DETECTED NOT DETECTED Final   Mycoplasma pneumoniae NOT DETECTED NOT DETECTED Final    Comment: Performed at [facility], [place] [address], [place], [place] [address]  Culture, Urine     Status: Abnormal   Collection Time: 01/04/18 12:00 AM  Result Value Ref Range Status   Specimen Description URINE, RANDOM  Final   Special Requests   Final    NONE Performed at [facility], [place] [address], [place], [place] [address]    Culture MULTIPLE SPECIES PRESENT, SUGGEST RECOLLECTION (A)  Final   Report Status 01/05/2018 FINAL  Final         Radiology Studies: Nm Tumor Localization W Spect  Result Date:  01/05/2018 CLINICAL DATA:  HEART FAILURE. CONCERN FOR CARDIAC AMYLOIDOSIS. EXAM: NUCLEAR MEDICINE TUMOR LOCALIZATION. PYP CARDIAC AMYLOIDOSIS SCAN WITH SPECT TECHNIQUE: Following intravenous administration of radiopharmaceutical, anterior planar images of the chest were obtained. Regions of interest were placed on the heart and contralateral chest wall for quantitative assessment. Additional SPECT imaging of the chest was obtained. RADIOPHARMACEUTICALS:  20.8 mCi TECHNETIUM 99 PYROPHOSPHATE FINDINGS: Planar Visual assessment: Anterior planar imaging demonstrates radiotracer uptake within the heart less than than uptake within the adjacent ribs (Grade 1). Quantitative assessment : Quantitative assessment of the cardiac uptake compared to the contralateral chest wall is equal to 1.28 (H/CL = 1.28). SPECT assessment: SPECT imaging of the chest demonstrates minimal radiotracer accumulation within the LEFT ventricle. IMPRESSION: Visual and quantitative assessment (grade 1, H/CL equal 1.28) are not suggestive of transthyretin amyloidosis. Electronically Signed   By: Suzy Bouchard M.D.   On: 01/05/2018 14:58      Scheduled Meds: . apixaban  5 mg Oral BID  . atorvastatin  80 mg Oral QHS  . dextromethorphan-guaiFENesin  1 tablet Oral BID  . fluticasone furoate-vilanterol  1 puff Inhalation Daily  . folic acid  1 mg Oral Daily  . insulin aspart  0-9 Units Subcutaneous TID WC  . insulin glargine  5 Units  Subcutaneous Daily  . levothyroxine  100 mcg Oral QAC breakfast  . macitentan  10 mg Oral Daily  . metoprolol tartrate  25 mg Oral BID  . multivitamin  1 tablet Oral Daily  . multivitamin with minerals  1 tablet Oral Daily  . pantoprazole (PROTONIX) IV  40 mg Intravenous Q12H  . predniSONE  40 mg Oral Q breakfast  . ranolazine  500 mg Oral BID  . sildenafil  40 mg Oral TID  . sodium chloride flush  10-40 mL Intracatheter Q12H  . sulfaSALAzine  1,000 mg Oral BID WC  . tiotropium  18 mcg Inhalation  Daily   Continuous Infusions:    LOS: 9 days    Time spent in minutes: Perry Hall, MD [facility] Pager: [email or web address] Password [email or web address] 01/06/2018, 12:50 PM

## 2018-01-06 NOTE — Care Management Important Message (Signed)
Important Message  Patient Details  Name: [name] MRN: [id number] Date of Birth: 10/28/1940   Medicare Important Message Given:  Yes    Barb Merino Berl Bonfanti 01/06/2018, 12:48 PM

## 2018-01-06 NOTE — Progress Notes (Addendum)
Pt had a large amount of bloody stool  x1 today. Received order for anusol.  Idolina Primer, RN

## 2018-01-07 ENCOUNTER — Telehealth (HOSPITAL_COMMUNITY): Payer: Self-pay | Admitting: Pharmacist

## 2018-01-07 ENCOUNTER — Encounter (HOSPITAL_COMMUNITY): Payer: Medicare Other

## 2018-01-07 LAB — COOXEMETRY PANEL
Carboxyhemoglobin: 1.8 % — ABNORMAL HIGH (ref 0.5–1.5)
METHEMOGLOBIN: 0.9 % (ref 0.0–1.5)
O2 Saturation: 66.1 %
TOTAL HEMOGLOBIN: 9.7 g/dL — AB (ref 12.0–16.0)

## 2018-01-07 LAB — CBC
HCT: 32.3 % — ABNORMAL LOW (ref 36.0–46.0)
HEMOGLOBIN: 9.5 g/dL — AB (ref 12.0–15.0)
MCH: 26.2 pg (ref 26.0–34.0)
MCHC: 29.4 g/dL — ABNORMAL LOW (ref 30.0–36.0)
MCV: 89 fL (ref 78.0–100.0)
Platelets: 393 10*3/uL (ref 150–400)
RBC: 3.63 MIL/uL — AB (ref 3.87–5.11)
RDW: 26.1 % — ABNORMAL HIGH (ref 11.5–15.5)
WBC: 9 10*3/uL (ref 4.0–10.5)

## 2018-01-07 LAB — BASIC METABOLIC PANEL
ANION GAP: 13 (ref 5–15)
BUN: 48 mg/dL — ABNORMAL HIGH (ref 6–20)
CALCIUM: 8.2 mg/dL — AB (ref 8.9–10.3)
CHLORIDE: 81 mmol/L — AB (ref 101–111)
CO2: 36 mmol/L — ABNORMAL HIGH (ref 22–32)
Creatinine, Ser: 1.25 mg/dL — ABNORMAL HIGH (ref 0.44–1.00)
GFR calc non Af Amer: 40 mL/min — ABNORMAL LOW (ref >60)
GFR, EST AFRICAN AMERICAN: 47 mL/min — AB (ref >60)
Glucose, Bld: 92 mg/dL (ref 65–99)
Potassium: 3.1 mmol/L — ABNORMAL LOW (ref 3.5–5.1)
Sodium: 130 mmol/L — ABNORMAL LOW (ref 135–145)

## 2018-01-07 LAB — GLUCOSE, CAPILLARY
GLUCOSE-CAPILLARY: 94 mg/dL (ref 65–99)
Glucose-Capillary: 170 mg/dL — ABNORMAL HIGH (ref 65–99)
Glucose-Capillary: 170 mg/dL — ABNORMAL HIGH (ref 65–99)

## 2018-01-07 MED ORDER — TORSEMIDE 20 MG PO TABS
20.0000 mg | ORAL_TABLET | Freq: Two times a day (BID) | ORAL | Status: DC
  Administered 2018-01-07 – 2018-01-08 (×4): 20 mg via ORAL
  Filled 2018-01-07 (×4): qty 1

## 2018-01-07 MED ORDER — SPIRONOLACTONE 25 MG PO TABS
25.0000 mg | ORAL_TABLET | Freq: Every day | ORAL | Status: DC
  Administered 2018-01-07 – 2018-01-08 (×2): 25 mg via ORAL
  Filled 2018-01-07 (×2): qty 1

## 2018-01-07 MED ORDER — POTASSIUM CHLORIDE CRYS ER 20 MEQ PO TBCR
40.0000 meq | EXTENDED_RELEASE_TABLET | Freq: Two times a day (BID) | ORAL | Status: DC
  Administered 2018-01-07: 40 meq via ORAL
  Filled 2018-01-07: qty 2

## 2018-01-07 MED ORDER — POTASSIUM CHLORIDE CRYS ER 20 MEQ PO TBCR
20.0000 meq | EXTENDED_RELEASE_TABLET | Freq: Once | ORAL | Status: AC
  Administered 2018-01-07: 20 meq via ORAL
  Filled 2018-01-07: qty 1

## 2018-01-07 MED ORDER — POTASSIUM CHLORIDE CRYS ER 20 MEQ PO TBCR
30.0000 meq | EXTENDED_RELEASE_TABLET | Freq: Once | ORAL | Status: DC
  Filled 2018-01-07: qty 1

## 2018-01-07 NOTE — Progress Notes (Addendum)
Advanced Heart Failure Rounding Note  PCP-Cardiologist: No primary care provider on file.   Subjective:    No CP, dizziness. Feels a little SOB this am. Reports that she had less blood in stool yesterday. She is frustrated that she is on a clear liquid diet.   Had 2 episodes of BRBPR yesterday. Given anusol. Apixiban continued. Hemoglobin stable 9.5 this am.    Respiratory panel + for rhinovirus   No diuretics yesterday. Weight up 1 lb. (down total 14 pounds).   Plan for DC to SNF. Delays with finding available bed yesterday. She also has to bring in macitentan and ranexa from home prior to going to SNF.   PYP scan negative for TTR amyloid   Echo 12/30/17:   - Left ventricle: Septal flattening indicative of elevated PA   pressures. Wall thickness was increased in a pattern of moderate   LVH. Systolic function was normal. The estimated ejection   fraction was in the range of 55% to 60%. The study is not   technically sufficient to allow evaluation of LV diastolic   function. - Mitral valve: There was mild regurgitation. - Left atrium: The atrium was severely dilated. - Right ventricle: The cavity size was moderately dilated. - Right atrium: The atrium was severely dilated. - Atrial septum: No defect or patent foramen ovale was identified. - Tricuspid valve: There was moderate regurgitation. - Pulmonary arteries: PA peak pressure: 58 mm Hg (S).  Objective:   Weight Range: 197 lb 8 oz (89.6 kg) Body mass index is 28.34 kg/m.   Vital Signs:   Temp:  [97.5 F (36.4 C)-98.5 F (36.9 C)] 97.5 F (36.4 C) (04/10 0614) Pulse Rate:  [82-95] 82 (04/10 0614) BP: (109-120)/(66-73) 109/71 (04/10 0614) SpO2:  [94 %-98 %] 95 % (04/10 0800) Weight:  [197 lb 8 oz (89.6 kg)] 197 lb 8 oz (89.6 kg) (04/10 0614) Last BM Date: 01/06/18  Weight change: Filed Weights   01/05/18 0531 01/06/18 0500 01/07/18 0614  Weight: 199 lb 12.8 oz (90.6 kg) 196 lb 12.8 oz (89.3 kg) 197 lb 8 oz  (89.6 kg)    Intake/Output:   Intake/Output Summary (Last 24 hours) at 01/07/2018 0815 Last data filed at 01/06/2018 2248 Gross per 24 hour  Intake 1440 ml  Output 1400 ml  Net 40 ml      Physical Exam   CVP 11 General: Elderly. No resp difficulty. HEENT: Normal Neck: Supple. JVP ~12. Carotids 2+ bilat; no bruits. No thyromegaly or nodule noted. Cor: PMI nondisplaced. IRR, 2/6 TR Lungs: CTAB, normal effort. Abdomen: obese, soft, non-tender, non-distended, no HSM. No bruits or masses. +BS  Extremities: No cyanosis, clubbing, or rash. R and LLE TED hose, trace edema.  Neuro: Alert & orientedx3, cranial nerves grossly intact. moves all 4 extremities w/o difficulty. Affect pleasant   Telemetry   Afib 70-80s. Personally reviewed.   EKG    No new tracings.   Labs    CBC Recent Labs    01/05/18 2251 01/06/18 0601 01/07/18 0428  WBC 10.3 10.5 9.0  NEUTROABS 8.6*  --   --   HGB 9.8* 9.2* 9.5*  HCT 32.7* 31.3* 32.3*  MCV 89.3 88.7 89.0  PLT 376 382 149   Basic Metabolic Panel Recent Labs    01/06/18 0601 01/07/18 0428  NA 129* 130*  K 3.7 3.1*  CL 80* 81*  CO2 35* 36*  GLUCOSE 101* 92  BUN 60* 48*  CREATININE 1.65* 1.25*  CALCIUM 7.9*  8.2*   Liver Function Tests No results for input(s): AST, ALT, ALKPHOS, BILITOT, PROT, ALBUMIN in the last 72 hours. No results for input(s): LIPASE, AMYLASE in the last 72 hours. Cardiac Enzymes No results for input(s): CKTOTAL, CKMB, CKMBINDEX, TROPONINI in the last 72 hours.  BNP: BNP (last 3 results) Recent Labs    12/25/17 1737  BNP 2,089.7*    ProBNP (last 3 results) No results for input(s): PROBNP in the last 8760 hours.   D-Dimer No results for input(s): DDIMER in the last 72 hours. Hemoglobin A1C No results for input(s): HGBA1C in the last 72 hours. Fasting Lipid Panel No results for input(s): CHOL, HDL, LDLCALC, TRIG, CHOLHDL, LDLDIRECT in the last 72 hours. Thyroid Function Tests No results for  input(s): TSH, T4TOTAL, T3FREE, THYROIDAB in the last 72 hours.  Invalid input(s): FREET3  Other results:   Imaging    No results found.   Medications:     Scheduled Medications: . apixaban  5 mg Oral BID  . atorvastatin  80 mg Oral QHS  . dextromethorphan-guaiFENesin  1 tablet Oral BID  . famotidine  20 mg Oral BID  . fluticasone furoate-vilanterol  1 puff Inhalation Daily  . folic acid  1 mg Oral Daily  . hydrocortisone  25 mg Rectal TID  . insulin aspart  0-9 Units Subcutaneous TID WC  . insulin glargine  5 Units Subcutaneous Daily  . levothyroxine  100 mcg Oral QAC breakfast  . macitentan  10 mg Oral Daily  . metoprolol tartrate  25 mg Oral BID  . multivitamin  1 tablet Oral Daily  . multivitamin with minerals  1 tablet Oral Daily  . potassium chloride  30 mEq Oral Once  . predniSONE  40 mg Oral Q breakfast  . ranolazine  500 mg Oral BID  . sildenafil  40 mg Oral TID  . sodium chloride flush  10-40 mL Intracatheter Q12H  . sulfaSALAzine  1,000 mg Oral BID WC  . tiotropium  18 mcg Inhalation Daily    Infusions:   PRN Medications: acetaminophen, levalbuterol, ondansetron (ZOFRAN) IV, phenol, sodium chloride flush    Patient Profile  Ms Weigand is a 77 year old with history of Diabetes, HTN, hyperlipidemia, hypothyroidism, obesity, tachy/brady s/p PPM, COPD, GERD, Af ib, CAD, and pulmonary hypertension.   Admitted with increased dyspnea.   Assessment/Plan   1. 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 Creatinine improved 1.25 this am.  Restart torsemide 20 bid today.  Adjust as needed.  Arlyce Harman held with AKI. Creatinine 1.25 this am. Restart spiro 25 mg daily.  - PYP scan negative for TTR amyloid  2. 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 -Out of  macitentan about 10 days. Restarted on admit.  - On combination therapy. Macitentan 10 mg daily + sildenafil 20 mg three times a day. On chronic oxygen. No change.    3. COPD - On chronic oxygen (night O2 at home). No change.   4. Natasha Mead- with medtronic PPM  5. Permanent A fib  - Rate improved 70-80s with restarting lopressor - Hgb stable on apixiban.   6. CAD  Cath 2013 with LAD 70-75% and ostial 95% D2.  - No s/s ischemia.  - On statin and eliquis   7. DM2 -  On insulin per Primary. No change.  - Consider Jardiance  8. CKD Stage III, baseline creatinine 1.3-1.6 - Creatinine back to baseline. 1.25 this am. Restart home diuretics.  - BMET daily  9. Deconditioing - PT recommending SNF. Delays with finding available bed yesterday. She has to bring in Macitentan and Ranexa because the facility cannot provide.  - Encouraged her to get OOB. No change.   10. Hypokalemia - K 3.1 today. Will supp with 40 meq x1. Also restarting spiro 25 mg daily.  11.Leukocytosis - Resolved.  - CXR, UA and bcx remaining negative. Flu swab negative. Resp panel + for rhinovirus.  - Continue incentive spirometer  12. Hyponatremia - Na 130. Stable.  Stable from our standpoint. Likely ok for SNF today.   Length of Stay: [place], NP  01/07/2018, 8:15 AM  Advanced Heart Failure Team Pager [phone] (M-F; [place])  Please contact [facility] for night-coverage after hours (4p -7a ) and weekends on [email or web address]   Patient seen and examined with the above-signed Advanced Practice Provider and/or Housestaff. I personally reviewed laboratory data, imaging studies and relevant notes. I independently examined the patient and formulated the important aspects of the plan. I have edited the note to reflect any of my changes or salient points. I have personally discussed the plan with the patient and/or family.  Says she feels very weak. Denies SOB, orthopnea or PND. Also states that  she is still having rectal bleeding but it is less. I discussed with nurse personally and she said she did not see any bleeding but rather fecal incontinence. Denies fevers or chills. Weight has been stable for past 5 days.  Co-ox 66%. Hgb stable.   I am not sure I have an explanation as to why she feels so poorly. From cardiac standpoint she is relatively optimized. Volume status and renal function stable. She has chronic AF and rate is well controlled.   If she is feeling better would agree with transfer to SNF.   Glori Bickers, MD  9:40 PM

## 2018-01-07 NOTE — Progress Notes (Signed)
CSW following to support with discharge to SNF. Patient's Opsumit medication will have to be delivered to her home and brought by a family member to the SNF. SNF will not accept the medication delivery. RNCM assisted with coordinating with heart failure team for medication prescription and delivery.   MD aware. CSW to support with discharge.  Estanislado Emms, [place]

## 2018-01-07 NOTE — Plan of Care (Signed)
  Problem: Pain Managment: Goal: General experience of comfort will improve Outcome: Completed/Met

## 2018-01-07 NOTE — Telephone Encounter (Signed)
Received message from [name], PA-C:  "They are saying to continue prescribing her Opsumit they need a "referral triage form package" filled out with a prescription. Can you please call and figure out what exactly they need?   [facility] -> [phone] [id number]. She has been having it delivered to her home. She's going to SNF, but I suspect she will have to have it delivered to her home, and have someone pick up for her (As SNF won't accept, nor will [facility] send to her home)"  I have called [facility] who stated that the patient was receiving her Opsumit through [facility] but she had been non-compliant so they closed out her referral. [facility] will again send the referral to [facility] to re-open her case. [name] will need to answer the phone call from [place] to continue receiving the medication.    Ruta Hinds. Velva Harman, PharmD, BCPS, CPP Clinical Pharmacist Phone: [phone] 01/07/2018 11:10 AM

## 2018-01-07 NOTE — Progress Notes (Signed)
PROGRESS NOTE    [name]   XVQ:[id number]  DOB: [birth year]  DOA: 12/25/2017 PCP: [name], MD   Brief Narrative:  [name]  is a 77 y.o. female who lives alone at home and is medically complicated. She has IDDm2, Hypertension, Hyperlipidemia, Hypothyroidism, OSA, CAD, COPD on nocturnal O2 (2L), Pafib, dCHF (EF 55%), PAH, RV failure and was sent to the ED for worsening dyspnea.   Admitted for acute on chronic combined heart failure. Subsequently developed a COPD flare due to rhinovirus. Yesterday noted to have 4 blood stools. Eliquis held and bleeding stopped. No drop in HB. Eliquis resumed today by cardiology. Follow Hb and stools. Will go to SNF.  Note SW states it will be hard for SNF to cover Macitentan. I have asked her to please discuss this with CHF team.   Subjective: Pt reports feeling weak.    Assessment & Plan:   Principal Problem:   Acute on chronic diastolic congestive heart failure, PAH and RV failure - PICC placed and IV Lasix given by CHF team - changed form IV Lasix to Demadex 20 ng BID- also on Aldactone - holding diuretics today - ECHO below shows EF 50-55% and severe dilated atria (left and right) and moderately dialted RV - ran out of Macitentan at home which has been restarted: per CHF team  Plan for DC to SNF. Delays with finding available bed yesterday. She also has to bring in macitentan and ranexa from home prior to going to SNF.    Active Problems:   Low grade fever, leukocytosis hypoxia on 4/6    H/o COPD     Acute bronchitis due to rhinovirus - follows with Dr Chase Caller & on 2 O2 at night at home - 4/5 noted to have> frontal headache, vomiting, increased cough, fevers 99 despite taking Tylenol  - supportive care given with Tylenol, Zofran- she is already on daily Protonix -  has PICC line - blood cultures done and found to be neg - Influenza neg - UA negative - had LLL crackles but only LLL atelectasis on CXR   - note Resp virus  panel is + for Rhinovirus which explains above symptoms - appears that she has diuresed well and this hypoxia is likely related to acute bronchitis due to rhinovirus and underlying COPD - 4/7-  She is on Breo Ellipta -  now congested/ wheezing, still hypoxic 88% on room air >  will add Prednisone and Xopenex TID   - start IS and PT for LLL atelectasis - leukocytosis and fevers resolving without antibiotics 4/8 - ongoing hypoxia and congestion> flutter valve, chest PT added - 4/9 - pulm status unchanged- recommend a slow steroid taper  Hypokalemia - due to diuretics- has been replaced  GI bleed - per RN, it was a large amount of blood yesterday, black and red blood mixed with stool- 4 episodes - interestingly Hb did not drop and so I wonder if it was hemorrhoidal - the patient states she has no h/o hemorrhoids and RN did not see any on exam but may be internal- start Anusol suppositories - I did start Protonix BID yesterday and held diuretics yesterday - change Protonix to Pepcid BID (on steroids) - Cardiology has resumed Eliquis today- follow for bleeding    Atrial fibrillation- paroxysmal - cont Eliquis (held yesterday) and Metoplrol   Coronary artery disease - cont Statin and Eliquis    CKD (chronic kidney disease) stage 3, GFR 30-59 ml/min  - following  with diuresis- Cr up today   DM2 -   A1c 5.8   - on Lantus 5 U (uses 10 at home) and SSI- sugars reasonably stable  Gout - on PRN Colchicine and nothing for prophylaxis-  Mild anemia - follow- on folic acid-  anemia panel reveals adequate Iron, folate and B12 stores  Crohn's disease - cont Sulfasalazine  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 Consultants:   Heart failure team Procedures:  2 D ECHO Study Conclusions  - Left ventricle: Septal flattening indicative of  elevated PA   pressures. Wall thickness was increased in a pattern of moderate   LVH. Systolic function was normal. The estimated ejection   fraction was in the range of 55% to 60%. The study is not   technically sufficient to allow evaluation of LV diastolic   function. - Mitral valve: There was mild regurgitation. - Left atrium: The atrium was severely dilated. - Right ventricle: The cavity size was moderately dilated. - Right atrium: The atrium was severely dilated. - Atrial septum: No defect or patent foramen ovale was identified. - Tricuspid valve: There was moderate regurgitation. - Pulmonary arteries: PA peak pressure: 58 mm Hg (S).  Antimicrobials:  Anti-infectives (From admission, onward)   Start     Dose/Rate Route Frequency Ordered Stop   01/06/18 1000  azithromycin (ZITHROMAX) tablet 250 mg  Status:  Discontinued     250 mg Oral Daily 01/05/18 0904 01/05/18 0905   01/05/18 1000  azithromycin (ZITHROMAX) tablet 500 mg  Status:  Discontinued     500 mg Oral Daily 01/05/18 0904 01/05/18 0905       Objective: Vitals:   01/07/18 0758 01/07/18 0800 01/07/18 0847 01/07/18 1417  BP:   106/62 103/64  Pulse:   96 86  Resp:      Temp:   98.6 F (37 C) 98.8 F (37.1 C)  TempSrc:   Oral Oral  SpO2: 94% 95% 92% 97%  Weight:      Height:        Intake/Output Summary (Last 24 hours) at 01/07/2018 1656 Last data filed at 01/07/2018 1300 Gross per 24 hour  Intake 1320 ml  Output 1600 ml  Net -280 ml   Filed Weights   01/05/18 0531 01/06/18 0500 01/07/18 0614  Weight: 90.6 kg (199 lb 12.8 oz) 89.3 kg (196 lb 12.8 oz) 89.6 kg (197 lb 8 oz)    Examination: General exam: Appears comfortable, in nad.  HEENT: PERRLA, oral mucosa moist, no sclera icterus or thrush Respiratory system: equal chest rise, rhales, no wheezes Cardiovascular system: S1 & S2 heard, IIRR  Gastrointestinal system: Abdomen soft, non-tender, nondistended. Normal bowel sound. No organomegaly Central  nervous system: Alert and oriented. No focal neurological deficits. Extremities: No cyanosis, clubbing no edema Skin: No rashes or ulcers    Data Reviewed: I have personally reviewed following labs and imaging studies  CBC: Recent Labs  Lab 01/04/18 0500 01/05/18 0440 01/05/18 2251 01/06/18 0601 01/07/18 0428  WBC 12.7* 11.0* 10.3 10.5 9.0  NEUTROABS  --   --  8.6*  --   --   HGB 10.0* 9.5* 9.8* 9.2* 9.5*  HCT 34.8* 32.5* 32.7* 31.3* 32.3*  MCV 89.9 89.3 89.3 88.7 89.0  PLT 290 297 376 382 220   Basic Metabolic Panel: Recent Labs  Lab 01/02/18 0419 01/03/18 0434 01/04/18  0500 01/05/18 0440 01/06/18 0601 01/07/18 0428  NA 133* 132* 129* 129* 129* 130*  K 3.0* 4.6 4.2 4.0 3.7 3.1*  CL 83* 80* 81* 81* 80* 81*  CO2 34* 36* 35* 34* 35* 36*  GLUCOSE 126* 118* 96 149* 101* 92  BUN 44* 40* 45* 55* 60* 48*  CREATININE 1.32* 1.35* 1.53* 1.49* 1.65* 1.25*  CALCIUM 8.6* 9.0 8.0* 7.9* 7.9* 8.2*  MG 1.8 2.5*  --   --   --   --    GFR: Estimated Creatinine Clearance: 45.8 mL/min (A) (by C-G formula based on SCr of 1.25 mg/dL (H)). Liver Function Tests: No results for input(s): AST, ALT, ALKPHOS, BILITOT, PROT, ALBUMIN in the last 168 hours. No results for input(s): LIPASE, AMYLASE in the last 168 hours. No results for input(s): AMMONIA in the last 168 hours. Coagulation Profile: No results for input(s): INR, PROTIME in the last 168 hours. Cardiac Enzymes: No results for input(s): CKTOTAL, CKMB, CKMBINDEX, TROPONINI in the last 168 hours. BNP (last 3 results) No results for input(s): PROBNP in the last 8760 hours. HbA1C: No results for input(s): HGBA1C in the last 72 hours. CBG: Recent Labs  Lab 01/06/18 1640 01/06/18 2026 01/07/18 0800 01/07/18 1121 01/07/18 1637  GLUCAP 243* 159* 94 170* 170*   Lipid Profile: No results for input(s): CHOL, HDL, LDLCALC, TRIG, CHOLHDL, LDLDIRECT in the last 72 hours. Thyroid Function Tests: No results for input(s): TSH, T4TOTAL,  FREET4, T3FREE, THYROIDAB in the last 72 hours. Anemia Panel: No results for input(s): VITAMINB12, FOLATE, FERRITIN, TIBC, IRON, RETICCTPCT in the last 72 hours. Urine analysis:    Component Value Date/Time   COLORURINE AMBER (A) 01/03/2018 1220   APPEARANCEUR CLEAR 01/03/2018 1220   LABSPEC 1.012 01/03/2018 1220   PHURINE 5.0 01/03/2018 1220   GLUCOSEU NEGATIVE 01/03/2018 1220   GLUCOSEU NEGATIVE 12/08/2007 1621   HGBUR SMALL (A) 01/03/2018 1220   BILIRUBINUR NEGATIVE 01/03/2018 1220   KETONESUR NEGATIVE 01/03/2018 1220   PROTEINUR 100 (A) 01/03/2018 1220   UROBILINOGEN 1.0 03/28/2015 1819   NITRITE NEGATIVE 01/03/2018 1220   LEUKOCYTESUR NEGATIVE 01/03/2018 1220   Sepsis Labs: @LABRCNTIP (procalcitonin:4,lacticidven:4) ) Recent Results (from the past 240 hour(s))  MRSA PCR Screening     Status: None   Collection Time: 12/31/17  9:13 PM  Result Value Ref Range Status   MRSA by PCR NEGATIVE NEGATIVE Final    Comment:        The GeneXpert MRSA Assay (FDA approved for NASAL specimens only), is one component of a comprehensive MRSA colonization surveillance program. It is not intended to diagnose MRSA infection nor to guide or monitor treatment for MRSA infections. Performed at [facility], [place] [address], [place], [place] [address]   Culture, blood (routine x 2)     Status: None (Preliminary result)   Collection Time: 01/03/18 10:00 AM  Result Value Ref Range Status   Specimen Description BLOOD LEFT WRIST  Final   Special Requests   Final    BOTTLES DRAWN AEROBIC ONLY Blood Culture adequate volume   Culture   Final    NO GROWTH 4 DAYS Performed at [facility], [place] [address] [place], [place] [address]    Report Status PENDING  Incomplete  Culture, blood (routine x 2)     Status: None (Preliminary result)   Collection Time: 01/03/18 10:10 AM  Result Value Ref Range Status   Specimen Description BLOOD LEFT HAND  Final   Special Requests  Final     BOTTLES DRAWN AEROBIC ONLY Blood Culture adequate volume   Culture   Final    NO GROWTH 4 DAYS Performed at [facility], [place] [address], [place], [place] [address]    Report Status PENDING  Incomplete  Respiratory Panel by PCR     Status: Abnormal   Collection Time: 01/03/18  6:44 PM  Result Value Ref Range Status   Adenovirus NOT DETECTED NOT DETECTED Final   Coronavirus 229E NOT DETECTED NOT DETECTED Final   Coronavirus HKU1 NOT DETECTED NOT DETECTED Final   Coronavirus NL63 NOT DETECTED NOT DETECTED Final   Coronavirus OC43 NOT DETECTED NOT DETECTED Final   Metapneumovirus NOT DETECTED NOT DETECTED Final   Rhinovirus / Enterovirus DETECTED (A) NOT DETECTED Final   Influenza A NOT DETECTED NOT DETECTED Final   Influenza B NOT DETECTED NOT DETECTED Final   Parainfluenza Virus 1 NOT DETECTED NOT DETECTED Final   Parainfluenza Virus 2 NOT DETECTED NOT DETECTED Final   Parainfluenza Virus 3 NOT DETECTED NOT DETECTED Final   Parainfluenza Virus 4 NOT DETECTED NOT DETECTED Final   Respiratory Syncytial Virus NOT DETECTED NOT DETECTED Final   Bordetella pertussis NOT DETECTED NOT DETECTED Final   Chlamydophila pneumoniae NOT DETECTED NOT DETECTED Final   Mycoplasma pneumoniae NOT DETECTED NOT DETECTED Final    Comment: Performed at [facility], [place] [address], [place], [place] [address]  Culture, Urine     Status: Abnormal   Collection Time: 01/04/18 12:00 AM  Result Value Ref Range Status   Specimen Description URINE, RANDOM  Final   Special Requests   Final    NONE Performed at [facility], [place] [address] [place], [place] [address]    Culture MULTIPLE SPECIES PRESENT, SUGGEST RECOLLECTION (A)  Final   Report Status 01/05/2018 FINAL  Final         Radiology Studies: No results found.    Scheduled Meds: . apixaban  5 mg Oral BID  . atorvastatin  80 mg Oral QHS  . dextromethorphan-guaiFENesin  1 tablet Oral BID  . famotidine  20 mg Oral BID  .  fluticasone furoate-vilanterol  1 puff Inhalation Daily  . folic acid  1 mg Oral Daily  . hydrocortisone  25 mg Rectal TID  . insulin aspart  0-9 Units Subcutaneous TID WC  . insulin glargine  5 Units Subcutaneous Daily  . levothyroxine  100 mcg Oral QAC breakfast  . macitentan  10 mg Oral Daily  . metoprolol tartrate  25 mg Oral BID  . multivitamin  1 tablet Oral Daily  . multivitamin with minerals  1 tablet Oral Daily  . predniSONE  40 mg Oral Q breakfast  . ranolazine  500 mg Oral BID  . sildenafil  40 mg Oral TID  . sodium chloride flush  10-40 mL Intracatheter Q12H  . spironolactone  25 mg Oral Daily  . sulfaSALAzine  1,000 mg Oral BID WC  . tiotropium  18 mcg Inhalation Daily  . torsemide  20 mg Oral BID   Continuous Infusions:    LOS: 10 days    Time spent in minutes: [place], MD [facility] Pager: [email or web address] Password [email or web address] 01/07/2018, 4:56 PM

## 2018-01-07 NOTE — Progress Notes (Signed)
PT Cancellation Note  Patient Details Name: [name] MRN: [id number] DOB: 10-28-1940   Cancelled Treatment:    Reason Eval/Treat Not Completed: Patient declined, no reason specified.  I will try back later, pt stated she didn't care... 01/07/2018  Donnella Sham, [place] [phone]  (pager)   [name] 01/07/2018, 12:08 PM

## 2018-01-08 LAB — GLUCOSE, CAPILLARY
GLUCOSE-CAPILLARY: 161 mg/dL — AB (ref 65–99)
GLUCOSE-CAPILLARY: 122 mg/dL — AB (ref 65–99)
Glucose-Capillary: 98 mg/dL (ref 65–99)
Glucose-Capillary: 153 mg/dL — ABNORMAL HIGH (ref 65–99)

## 2018-01-08 LAB — CULTURE, BLOOD (ROUTINE X 2)
CULTURE: NO GROWTH
Culture: NO GROWTH
Special Requests: ADEQUATE
Special Requests: ADEQUATE

## 2018-01-08 LAB — BASIC METABOLIC PANEL
ANION GAP: 11 (ref 5–15)
BUN: 37 mg/dL — ABNORMAL HIGH (ref 6–20)
CO2: 36 mmol/L — ABNORMAL HIGH (ref 22–32)
Calcium: 8 mg/dL — ABNORMAL LOW (ref 8.9–10.3)
Chloride: 86 mmol/L — ABNORMAL LOW (ref 101–111)
Creatinine, Ser: 1.09 mg/dL — ABNORMAL HIGH (ref 0.44–1.00)
GFR, EST AFRICAN AMERICAN: 55 mL/min — AB (ref >60)
GFR, EST NON AFRICAN AMERICAN: 48 mL/min — AB (ref >60)
Glucose, Bld: 97 mg/dL (ref 65–99)
POTASSIUM: 3.4 mmol/L — AB (ref 3.5–5.1)
SODIUM: 133 mmol/L — AB (ref 135–145)

## 2018-01-08 LAB — CBC WITH DIFFERENTIAL/PLATELET
BASOS ABS: 0 10*3/uL (ref 0.0–0.1)
Basophils Relative: 0 %
Eosinophils Absolute: 0 10*3/uL (ref 0.0–0.7)
Eosinophils Relative: 0 %
HEMATOCRIT: 32.7 % — AB (ref 36.0–46.0)
Hemoglobin: 9.6 g/dL — ABNORMAL LOW (ref 12.0–15.0)
LYMPHS ABS: 0.7 10*3/uL (ref 0.7–4.0)
LYMPHS PCT: 6 %
MCH: 26.4 pg (ref 26.0–34.0)
MCHC: 29.4 g/dL — ABNORMAL LOW (ref 30.0–36.0)
MCV: 89.8 fL (ref 78.0–100.0)
Monocytes Absolute: 1.6 10*3/uL — ABNORMAL HIGH (ref 0.1–1.0)
Monocytes Relative: 13 %
NEUTROS PCT: 81 %
Neutro Abs: 10.1 10*3/uL — ABNORMAL HIGH (ref 1.7–7.7)
Platelets: 366 10*3/uL (ref 150–400)
RBC: 3.64 MIL/uL — AB (ref 3.87–5.11)
RDW: 26 % — AB (ref 11.5–15.5)
WBC: 12.4 10*3/uL — AB (ref 4.0–10.5)

## 2018-01-08 LAB — COOXEMETRY PANEL
CARBOXYHEMOGLOBIN: 1.4 % (ref 0.5–1.5)
Methemoglobin: 1.6 % — ABNORMAL HIGH (ref 0.0–1.5)
O2 SAT: 58 %
Total hemoglobin: 9.8 g/dL — ABNORMAL LOW (ref 12.0–16.0)

## 2018-01-08 MED ORDER — LANTUS 100 UNIT/ML ~~LOC~~ SOLN: 5.0000 [IU] | Freq: Every day | SUBCUTANEOUS | 0 refills | Status: AC

## 2018-01-08 MED ORDER — TORSEMIDE 20 MG PO TABS: 20.0000 mg | ORAL_TABLET | Freq: Two times a day (BID) | ORAL | Status: AC

## 2018-01-08 MED ORDER — PREDNISONE 20 MG PO TABS: 40.0000 mg | ORAL_TABLET | Freq: Every day | ORAL | Status: AC

## 2018-01-08 MED ORDER — METOPROLOL TARTRATE 25 MG PO TABS: 25.0000 mg | ORAL_TABLET | Freq: Two times a day (BID) | ORAL | 0 refills | Status: AC

## 2018-01-08 MED ORDER — POTASSIUM CHLORIDE CRYS ER 20 MEQ PO TBCR
40.0000 meq | EXTENDED_RELEASE_TABLET | Freq: Every day | ORAL | Status: DC
  Administered 2018-01-08: 40 meq via ORAL
  Filled 2018-01-08: qty 2

## 2018-01-08 MED ORDER — ACETAMINOPHEN 325 MG PO TABS: 650.0000 mg | ORAL_TABLET | Freq: Four times a day (QID) | ORAL | Status: AC | PRN

## 2018-01-08 NOTE — Clinical Social Work Placement (Signed)
   CLINICAL SOCIAL WORK PLACEMENT  NOTE  Date:  01/08/2018  Patient Details  Name: [name] MRN: [id number] Date of Birth: May 11, 1941  Clinical Social Work is seeking post-discharge placement for this patient at the [place] level of care (*CSW will initial, date and re-position this form in  chart as items are completed):  Yes   Patient/family provided with [facility] Work Department's list of facilities offering this level of care within the geographic area requested by the patient (or if unable, by the patient's family).  Yes   Patient/family informed of their freedom to choose among providers that offer the needed level of care, that participate in Medicare, Medicaid or managed care program needed by the patient, have an available bed and are willing to accept the patient.  Yes   Patient/family informed of [facility]'s ownership interest in [facility] and [facility], as well as of the fact that they are under no obligation to receive care at these facilities.  PASRR submitted to EDS on       PASRR number received on       Existing PASRR number confirmed on 01/05/18     FL2 transmitted to all facilities in geographic area requested by pt/family on 01/05/18     FL2 transmitted to all facilities within larger geographic area on       Patient informed that his/her managed care company has contracts with or will negotiate with certain facilities, including the following:  [facility]     Yes   Patient/family informed of bed offers received.  Patient chooses bed at [facility]     Physician recommends and patient chooses bed at      Patient to be transferred to [facility] on 01/08/18.  Patient to be transferred to facility by [facility]     Patient family notified on 01/08/18 of transfer.  Name of family member notified:        PHYSICIAN Please prepare priority discharge summary,  including medications, Please prepare prescriptions     Additional Comment:    _______________________________________________ Estanislado Emms, LCSW 01/08/2018, 11:41 AM

## 2018-01-08 NOTE — Progress Notes (Signed)
Physical Therapy Treatment Patient Details Name: [name] MRN: [id number] DOB: 11-27-1940 Today's Date: 01/08/2018    History of Present Illness Pt is a 77 y.o. female admitted 12/25/17 with weakness and LE edema; worked up for CHF exacerbation. PMH includes DMII, HTN, a-fib, OSA, CAD, CHF, CKD III, gout, pacemaker.    PT Comments    Pt not making much progress.  She takes a lot of encouragement and fatigues quickly, but doesn't push herself to do more after a rest period.   Emphasis is on any standing or gait activity.  Follow Up Recommendations  SNF;Supervision/Assistance - 24 hour     Equipment Recommendations  None recommended by PT    Recommendations for Other Services       Precautions / Restrictions Precautions Precautions: Fall    Mobility  Bed Mobility Overal bed mobility: Needs Assistance Bed Mobility: Supine to Sit;Sit to Supine     Supine to sit: Min guard Sit to supine: Min assist      Transfers Overall transfer level: Needs assistance   Transfers: Sit to/from [facility] Sit to Stand: Min assist Stand pivot transfers: Min assist       General transfer comment: cues for hand placement.  steady assist whether using an AD or not  Ambulation/Gait Ambulation/Gait assistance: Min assist;Mod assist Ambulation Distance (Feet): 20 Feet Assistive device: 4-wheeled walker Gait Pattern/deviations: Step-through pattern;Trunk flexed     General Gait Details: pt getting weaker and less able to control the rollator   Stairs            Wheelchair Mobility    Modified Rankin (Stroke Patients Only)       Balance     Sitting balance-Leahy Scale: Good       Standing balance-Leahy Scale: Poor                              Cognition Arousal/Alertness: Awake/alert Behavior During Therapy: Flat affect Overall Cognitive Status: Within Functional Limits for tasks assessed                                         Exercises      General Comments        Pertinent Vitals/Pain Pain Assessment: Faces Faces Pain Scale: Hurts little more Pain Location: vague locations Pain Descriptors / Indicators: Grimacing;Discomfort Pain Intervention(s): Monitored during session;Limited activity within patient's tolerance    Home Living                      Prior Function            PT Goals (current goals can now be found in the care plan section) Acute Rehab PT Goals Patient Stated Goal: Less pain PT Goal Formulation: With patient Time For Goal Achievement: 01/10/2018 Potential to Achieve Goals: Good Progress towards PT goals: Progressing toward goals    Frequency    Min 3X/week      PT Plan Current plan remains appropriate    Co-evaluation              AM-PAC PT "6 Clicks" Daily Activity  Outcome Measure  Difficulty turning over in bed (including adjusting bedclothes, sheets and blankets)?: A Little Difficulty moving from lying on back to sitting on the side of the bed? : A Little Difficulty sitting down  on and standing up from a chair with arms (e.g., wheelchair, bedside commode, etc,.)?: Unable Help needed moving to and from a bed to chair (including a wheelchair)?: A Little Help needed walking in hospital room?: A Lot Help needed climbing 3-5 steps with a railing? : A Lot 6 Click Score: 14    End of Session   Activity Tolerance: Patient limited by fatigue Patient left: in bed;with call bell/phone within reach;Other (comment) Nurse Communication: Mobility status PT Visit Diagnosis: Other abnormalities of gait and mobility (R26.89)     Time: 1610-9604 PT Time Calculation (min) (ACUTE ONLY): 27 min  Charges:  $Gait Training: 8-22 mins $Therapeutic Activity: 8-22 mins                    G Codes:       02/03/2018  [name], PT [phone]  (pager)   [name] 2018/02/03, 5:41 PM

## 2018-01-08 NOTE — Discharge Summary (Signed)
Physician Discharge Summary  [name] WVP:[id number] DOB: 1941/09/22 DOA: 12/25/2017  PCP: [name], MD  Admit date: 12/25/2017 Discharge date: 01/08/2018  Time spent: 35 minutes  Recommendations for Outpatient Follow-up:  1. Please continue to taper prednisone pt will be discharged on 40 po daily.  2. 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 ([place]) Active Problems:   Atrial fibrillation (HCC)   COPD with emphysema ([place])   CARDIAC PACEMAKER IN SITU-MEDTRONIC ADAPTA L   Long term current use of anticoagulant therapy   Coronary artery disease   CKD (chronic kidney disease) stage 3, GFR 30-59 ml/min (HCC)   Crohn's disease of large intestine without complication (HCC)   Gout   Type 2 diabetes mellitus with diabetic nephropathy (HCC)   Fever   Chronic atrial fibrillation (HCC)   Hypokalemia   Anemia   Generalized weakness   Leukocytosis   PICC (peripherally inserted central catheter) in place   Respiratory crackles at left lung base   Cough   Frontal headache   Acute bronchitis due to Rhinovirus   Discharge Condition: stable  Diet recommendation: heart healthy/diabetic diet  Filed Weights   01/06/18 0500 01/07/18 0614 01/08/18 0652  Weight: 89.3 kg (196 lb 12.8 oz) 89.6 kg (197 lb 8 oz) 89 kg (196 lb 4.8 oz)      HPI/Hospital Course:  [name]  is a46 y.o.female who lives alone at home and is medically complicated. She has IDDm2, Hypertension, Hyperlipidemia, Hypothyroidism, OSA, CAD, COPD on nocturnal O2 (2L), Pafib, dCHF (EF 55%), PAH, RV failure and was sent to the ED for worsening dyspnea.   Admitted for acute on chronic combined heart failure. Subsequently developed a COPD flare due to rhinovirus. Yesterday noted to have 4 blood stools. Eliquis held and bleeding stopped. No drop in HB. Eliquis resumed today by cardiology. Follow Hb and stools. Will go  to SNF  Active Problems:   Low grade fever, leukocytosis hypoxia on 4/6    H/o COPD     Acute bronchitis due to rhinovirus - follows with Dr Chase Caller & on 2 O2 at night at home - 4/5 noted to have> frontal headache, vomiting, increased cough, fevers 99 despite taking Tylenol  - supportive care given with Tylenol, Zofran- she is already on daily Protonix -  has PICC line - blood cultures done and found to be neg - Influenza neg - UA negative - had LLL crackles but only LLL atelectasis on CXR   - note Resp virus panel is + for Rhinovirus which explains above symptoms - appears that she has diuresed well and this hypoxia is likely related to acute bronchitis due to rhinovirus and underlying COPD  - start IS and PT for LLL atelectasis - leukocytosis and fevers resolving without antibiotics 4/8 - ongoing hypoxia and congestion> flutter valve, chest PT added - 4/9 - pulm status unchanged- recommend a slow steroid taper  Hypokalemia - due to diuretics - continue to monitor at facility and replace if needed. - continue oral K replacement on d/c  GI bleed - per RN, it was a large amount of blood yesterday, black and red blood mixed with stool- 4 episodes - interestingly Hb did not drop and so I wonder if it was hemorrhoidal - the patient states she has no h/o hemorrhoids and RN did not see any on exam but may be internal- start may continue Anusol suppositories at SNF -  Cardiology has resumed Eliquis  - hgb levels stable    Atrial fibrillation- paroxysmal - cont Eliquis and Metoplrol   Coronary artery disease - cont Statin and Eliquis    CKD (chronic kidney disease) stage 3, GFR 30-59 ml/min  - following with diuresis- Cr up today   DM2 -   A1c 5.8   - on Lantus 5 U will continue on d/c - diabetic diet  Gout - on PRN Colchicine   Mild anemia - follow- on folic acid-  anemia panel reveals adequate Iron, folate and B12 stores  Crohn's disease - cont  Sulfasalazine  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 Continue torsemide 20 bid.  Adjust as needed.  - Continue spiro 25 mg daily.  - PYP scan negative for TTR amyloid  2. 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 Restarted on admit.  - On combination therapy. Macitentan 10 mg daily + sildenafil 20 mg three times a day. On chronic oxygen.No change.     Procedures:  none  Consultations:  none  Discharge Exam: Vitals:   01/08/18 1132 01/08/18 1144  BP:  116/71  Pulse:  100  Resp:  20  Temp:    SpO2: 92% 93%    General: Pt in nad, alert and awake Cardiovascular: s1 and s2 present Respiratory: equal chest rise,  in place  Discharge Instructions   Discharge Instructions    Call MD for:  difficulty breathing, headache or visual disturbances   Complete by:  As directed    Call MD for:  temperature >100.4   Complete by:  As directed    Diet - low sodium heart healthy   Complete by:  As directed    Discharge instructions   Complete by:  As directed    Please be sure to follow up with your cardiologist for further evaluation and recommendations.   Increase activity slowly   Complete by:  As directed      Allergies as of 01/08/2018   No Known Allergies     Medication List    STOP taking these medications   dicyclomine 20 MG tablet Commonly known as:  BENTYL   diphenhydramine-acetaminophen 25-500 MG Tabs tablet Commonly known as:  TYLENOL PM   losartan 50 MG tablet Commonly known as:  COZAAR   nystatin cream Commonly known as:  MYCOSTATIN     TAKE these medications   acetaminophen 325 MG tablet Commonly known as:   TYLENOL Take 2 tablets (650 mg total) by mouth every 6 (six) hours as needed for mild pain, fever or headache.   ADVAIR DISKUS 500-50 MCG/DOSE Aepb Generic drug:  Fluticasone-Salmeterol Inhale 1 puff into the lungs every 12 (twelve) hours. What changed:  Another medication with the same name was removed. Continue taking this medication, and follow the directions you see here.   albuterol 108 (90 Base) MCG/ACT inhaler Commonly known as:  PROVENTIL HFA;VENTOLIN HFA Inhale 2 puffs into the lungs every 6 (six) hours as needed for wheezing or shortness of breath.   apixaban 5 MG Tabs tablet Commonly known as:  ELIQUIS Take 1 tablet (5 mg total) by mouth 2 (two) times daily. What changed:  Another medication  with the same name was removed. Continue taking this medication, and follow the directions you see here.   atorvastatin 80 MG tablet Commonly known as:  LIPITOR Take 80 mg by mouth at bedtime.   colchicine 0.6 MG tablet Take 0.6-1.2 mg by mouth daily as needed (for gout flares). Reported on 11/07/7865   folic acid 1 MG tablet Commonly known as:  FOLVITE Take 1 mg by mouth daily.   KLOR-CON M20 20 MEQ tablet Generic drug:  potassium chloride SA TAKE 1 TABLET (20 MEQ TOTAL) BY MOUTH 2 (TWO) TIMES DAILY. What changed:  See the new instructions.   LANTUS 100 UNIT/ML injection Generic drug:  insulin glargine Inject 0.05 mLs (5 Units total) into the skin daily before breakfast. What changed:  how much to take   levothyroxine 100 MCG tablet Commonly known as:  SYNTHROID, LEVOTHROID Take 100 mcg by mouth daily.   metoprolol tartrate 25 MG tablet Commonly known as:  LOPRESSOR Take 1 tablet (25 mg total) by mouth 2 (two) times daily. What changed:    medication strength  how much to take   multivitamin with minerals tablet Take 1 tablet by mouth daily. What changed:  Another medication with the same name was removed. Continue taking this medication, and follow the directions  you see here.   nitroGLYCERIN 0.4 MG SL tablet Commonly known as:  NITROSTAT Place 1 tablet (0.4 mg total) under the tongue every 5 (five) minutes as needed for chest pain.   omeprazole 20 MG capsule Commonly known as:  PRILOSEC Take 20 mg by mouth daily.   OPSUMIT 10 MG tablet Generic drug:  macitentan Take 10 mg by mouth daily.   predniSONE 20 MG tablet Commonly known as:  DELTASONE Take 2 tablets (40 mg total) by mouth daily with breakfast. Start taking on:  01/09/2018   ranolazine 500 MG 12 hr tablet Commonly known as:  RANEXA Take 1 tablet by mouth twice daily. What changed:    how much to take  how to take this  when to take this  additional instructions   sildenafil 20 MG tablet Commonly known as:  REVATIO Take 2 tablets (40 mg total) by mouth 3 (three) times daily.   spironolactone 25 MG tablet Commonly known as:  ALDACTONE Take 0.5 tablets (12.5 mg total) by mouth daily.   sulfaSALAzine 500 MG tablet Commonly known as:  AZULFIDINE Take 1,000 mg by mouth 2 (two) times daily.   Tiotropium Bromide Monohydrate 2.5 MCG/ACT Aers Commonly known as:  SPIRIVA RESPIMAT INHALE 2 PUFFS INTO THE LUNGS DAILY.   torsemide 20 MG tablet Commonly known as:  DEMADEX Take 1 tablet (20 mg total) by mouth 2 (two) times daily. What changed:    how much to take  when to take this      No Known Allergies Follow-up [facility] Follow up.   Specialty:  Home Health Services Why:  Registered Nurse, Physical Therapy Contact information: [address] [place] [address] [phone]        Bensimhon, Shaune Pascal, MD. Go on 01/12/2018.   Specialty:  Cardiology Why:  3:20 PM, [facility], parking code 1100 Contact information: [address] [place] [address] [phone]            The results of significant diagnostics from this hospitalization (including imaging,  microbiology, ancillary and laboratory) are listed below for reference.    Significant Diagnostic Studies: Dg Chest  2 View  Result Date: 01/03/2018 CLINICAL DATA:  Cough. EXAM: CHEST - 2 VIEW COMPARISON:  01/01/2018 FINDINGS: Stable cardiac enlargement and appearance of pacemaker. Stable appearance of right upper extremity PICC line with catheter tip in SVC. Lungs show stable chronic disease and mild interstitial and vascular prominence without evidence of overt pulmonary edema. Stable left lower lobe atelectasis. No significant pleural effusion. Bony structures are unremarkable. IMPRESSION: Stable chronic lung disease, interstitial/vascular prominence, left lower lobe atelectasis and cardiac enlargement. Electronically Signed   By: Aletta Edouard M.D.   On: 01/03/2018 15:20   Dg Chest 2 View  Result Date: 12/25/2017 CLINICAL DATA:  Shortness of breath and lower extremity edema EXAM: CHEST - 2 VIEW COMPARISON:  January 07, 2016 FINDINGS: There is a small left pleural effusion with slight left base atelectasis. Lungs elsewhere are clear. There is cardiomegaly with pulmonary vascularity within normal limits. Pacemaker leads are attached to the right atrium and middle cardiac vein. There are foci of coronary artery calcification in the left anterior descending coronary artery. No adenopathy. There is degenerative change in the thoracic spine. There are surgical clips in the thyroid region. IMPRESSION: Small left pleural effusion with mild left base atelectasis. No edema or consolidation. Cardiomegaly. Calcification in the left anterior descending coronary artery. Pacemaker leads as described. No pneumothorax. Electronically Signed   By: Lowella Grip III M.D.   On: 12/25/2017 18:08   Nm Tumor Localization W Spect  Result Date: 01/05/2018 CLINICAL DATA:  HEART FAILURE. CONCERN FOR CARDIAC AMYLOIDOSIS. EXAM: NUCLEAR MEDICINE TUMOR LOCALIZATION. PYP CARDIAC AMYLOIDOSIS SCAN WITH SPECT TECHNIQUE: Following  intravenous administration of radiopharmaceutical, anterior planar images of the chest were obtained. Regions of interest were placed on the heart and contralateral chest wall for quantitative assessment. Additional SPECT imaging of the chest was obtained. RADIOPHARMACEUTICALS:  20.8 mCi TECHNETIUM 99 PYROPHOSPHATE FINDINGS: Planar Visual assessment: Anterior planar imaging demonstrates radiotracer uptake within the heart less than than uptake within the adjacent ribs (Grade 1). Quantitative assessment : Quantitative assessment of the cardiac uptake compared to the contralateral chest wall is equal to 1.28 (H/CL = 1.28). SPECT assessment: SPECT imaging of the chest demonstrates minimal radiotracer accumulation within the LEFT ventricle. IMPRESSION: Visual and quantitative assessment (grade 1, H/CL equal 1.28) are not suggestive of transthyretin amyloidosis. Electronically Signed   By: Suzy Bouchard M.D.   On: 01/05/2018 14:58   Dg Chest Port 1 View  Result Date: 01/01/2018 CLINICAL DATA:  Status post PICC line placement EXAM: PORTABLE CHEST 1 VIEW COMPARISON:  12/25/2017 FINDINGS: Right-sided PICC line is now seen with the catheter tip at the cavoatrial junction in satisfactory position. Cardiac shadow remains enlarged. Pacing device is again seen and stable. The lungs are well aerated with some chronic blunting of the left costophrenic angle. IMPRESSION: PICC line in satisfactory position. Electronically Signed   By: Inez Catalina M.D.   On: 01/01/2018 12:19   [place] Ekg Site Rite  Result Date: 12/31/2017 If Site Rite image not attached, placement could not be confirmed due to current cardiac rhythm.   Microbiology: Recent Results (from the past 240 hour(s))  MRSA PCR Screening     Status: None   Collection Time: 12/31/17  9:13 PM  Result Value Ref Range Status   MRSA by PCR NEGATIVE NEGATIVE Final    Comment:        The GeneXpert MRSA Assay (FDA approved for NASAL specimens only), is one component  of a comprehensive MRSA colonization surveillance program. It is not  intended to diagnose MRSA infection nor to guide or monitor treatment for MRSA infections. Performed at [facility], [place] [address], [place], [place] [address]   Culture, blood (routine x 2)     Status: None   Collection Time: 01/03/18 10:00 AM  Result Value Ref Range Status   Specimen Description BLOOD LEFT WRIST  Final   Special Requests   Final    BOTTLES DRAWN AEROBIC ONLY Blood Culture adequate volume   Culture   Final    NO GROWTH 5 DAYS Performed at [facility], [place] [address], [place], [place] [address]    Report Status 01/08/2018 FINAL  Final  Culture, blood (routine x 2)     Status: None   Collection Time: 01/03/18 10:10 AM  Result Value Ref Range Status   Specimen Description BLOOD LEFT HAND  Final   Special Requests   Final    BOTTLES DRAWN AEROBIC ONLY Blood Culture adequate volume   Culture   Final    NO GROWTH 5 DAYS Performed at [facility], [place] [address] [place], [place] [address]    Report Status 01/08/2018 FINAL  Final  Respiratory Panel by PCR     Status: Abnormal   Collection Time: 01/03/18  6:44 PM  Result Value Ref Range Status   Adenovirus NOT DETECTED NOT DETECTED Final   Coronavirus 229E NOT DETECTED NOT DETECTED Final   Coronavirus HKU1 NOT DETECTED NOT DETECTED Final   Coronavirus NL63 NOT DETECTED NOT DETECTED Final   Coronavirus OC43 NOT DETECTED NOT DETECTED Final   Metapneumovirus NOT DETECTED NOT DETECTED Final   Rhinovirus / Enterovirus DETECTED (A) NOT DETECTED Final   Influenza A NOT DETECTED NOT DETECTED Final   Influenza B NOT DETECTED NOT DETECTED Final   Parainfluenza Virus 1 NOT DETECTED NOT DETECTED Final   Parainfluenza Virus 2 NOT DETECTED NOT DETECTED Final   Parainfluenza Virus 3 NOT DETECTED NOT DETECTED Final   Parainfluenza Virus 4 NOT DETECTED NOT DETECTED Final   Respiratory Syncytial Virus NOT DETECTED NOT DETECTED Final    Bordetella pertussis NOT DETECTED NOT DETECTED Final   Chlamydophila pneumoniae NOT DETECTED NOT DETECTED Final   Mycoplasma pneumoniae NOT DETECTED NOT DETECTED Final    Comment: Performed at [facility], [place] [address], [place], [place] [address]  Culture, Urine     Status: Abnormal   Collection Time: 01/04/18 12:00 AM  Result Value Ref Range Status   Specimen Description URINE, RANDOM  Final   Special Requests   Final    NONE Performed at [facility], [place] [address], [place], [place] [address]    Culture MULTIPLE SPECIES PRESENT, SUGGEST RECOLLECTION (A)  Final   Report Status 01/05/2018 FINAL  Final     Labs: Basic Metabolic Panel: Recent Labs  Lab 01/02/18 0419 01/03/18 0434 01/04/18 0500 01/05/18 0440 01/06/18 0601 01/07/18 0428 01/08/18 0442  NA 133* 132* 129* 129* 129* 130* 133*  K 3.0* 4.6 4.2 4.0 3.7 3.1* 3.4*  CL 83* 80* 81* 81* 80* 81* 86*  CO2 34* 36* 35* 34* 35* 36* 36*  GLUCOSE 126* 118* 96 149* 101* 92 97  BUN 44* 40* 45* 55* 60* 48* 37*  CREATININE 1.32* 1.35* 1.53* 1.49* 1.65* 1.25* 1.09*  CALCIUM 8.6* 9.0 8.0* 7.9* 7.9* 8.2* 8.0*  MG 1.8 2.5*  --   --   --   --   --    Liver Function Tests: No results  for input(s): AST, ALT, ALKPHOS, BILITOT, PROT, ALBUMIN in the last 168 hours. No results for input(s): LIPASE, AMYLASE in the last 168 hours. No results for input(s): AMMONIA in the last 168 hours. CBC: Recent Labs  Lab 01/05/18 0440 01/05/18 2251 01/06/18 0601 01/07/18 0428 01/08/18 0442  WBC 11.0* 10.3 10.5 9.0 12.4*  NEUTROABS  --  8.6*  --   --  10.1*  HGB 9.5* 9.8* 9.2* 9.5* 9.6*  HCT 32.5* 32.7* 31.3* 32.3* 32.7*  MCV 89.3 89.3 88.7 89.0 89.8  PLT 297 376 382 393 366   Cardiac Enzymes: No results for input(s): CKTOTAL, CKMB, CKMBINDEX, TROPONINI in the last 168 hours. BNP: BNP (last 3 results) Recent Labs    12/25/17 1737  BNP 2,089.7*    ProBNP (last 3 results) No results for input(s): PROBNP in the last 8760  hours.  CBG: Recent Labs  Lab 01/07/18 1121 01/07/18 1637 01/07/18 2023 01/08/18 0739 01/08/18 1146  GLUCAP 170* 170* 161* 98 122*       Signed:  Velvet Bathe MD.  [facility] 01/08/2018, 2:52 PM

## 2018-01-08 NOTE — Progress Notes (Addendum)
Advanced Heart Failure Rounding Note  PCP-Cardiologist: No primary care provider on file.   Subjective:    Respiratory panel + for rhinovirus    Restarted PO diuretics yesterday. No UOP charted. Weight down 1 lb. Creatinine 1.09. Co-ox 58%. AF rate controlled.   Not feeling well today. Concerned about incontinence of stool at SNF. Feels like she has more congestion, but cough is better. Breathing is about the same. No CP. No more blood in stool.   PYP scan negative for TTR amyloid   Echo 12/30/17:   - Left ventricle: Septal flattening indicative of elevated PA   pressures. Wall thickness was increased in a pattern of moderate   LVH. Systolic function was normal. The estimated ejection   fraction was in the range of 55% to 60%. The study is not   technically sufficient to allow evaluation of LV diastolic   function. - Mitral valve: There was mild regurgitation. - Left atrium: The atrium was severely dilated. - Right ventricle: The cavity size was moderately dilated. - Right atrium: The atrium was severely dilated. - Atrial septum: No defect or patent foramen ovale was identified. - Tricuspid valve: There was moderate regurgitation. - Pulmonary arteries: PA peak pressure: 58 mm Hg (S).  Objective:   Weight Range: 196 lb 4.8 oz (89 kg) Body mass index is 28.17 kg/m.   Vital Signs:   Temp:  [97.8 F (36.6 C)-98.8 F (37.1 C)] 97.8 F (36.6 C) (04/11 0840) Pulse Rate:  [86-100] 100 (04/11 1144) Resp:  [20] 20 (04/11 1144) BP: (89-117)/(54-71) 116/71 (04/11 1144) SpO2:  [91 %-97 %] 93 % (04/11 1144) Weight:  [196 lb 4.8 oz (89 kg)] 196 lb 4.8 oz (89 kg) (04/11 0652) Last BM Date: 01/07/18  Weight change: Filed Weights   01/06/18 0500 01/07/18 0614 01/08/18 0652  Weight: 196 lb 12.8 oz (89.3 kg) 197 lb 8 oz (89.6 kg) 196 lb 4.8 oz (89 kg)    Intake/Output:   Intake/Output Summary (Last 24 hours) at 01/08/2018 1333 Last data filed at 01/08/2018 0930 Gross per 24  hour  Intake 840 ml  Output 400 ml  Net 440 ml      Physical Exam   CVP: 13 General: No resp difficulty. HEENT: Normal Neck: Supple. JVP 8-10. Carotids 2+ bilat; no bruits. No thyromegaly or nodule noted. Cor: PMI nondisplaced. RRR, No M/G/R noted Lungs: ronchi in bases. 4L [place] Abdomen: Soft, non-tender, non-distended, no HSM. No bruits or masses. +BS  Extremities: No cyanosis, clubbing, or rash. R and LLE 1-2+ edema L>R, warm Neuro: Alert & orientedx3, cranial nerves grossly intact. moves all 4 extremities w/o difficulty. Affect pleasant   Telemetry   Afib 80-90s. Personally reviewed.   EKG    No new tracings.   Labs    CBC Recent Labs    01/05/18 2251  01/07/18 0428 01/08/18 0442  WBC 10.3   < > 9.0 12.4*  NEUTROABS 8.6*  --   --  10.1*  HGB 9.8*   < > 9.5* 9.6*  HCT 32.7*   < > 32.3* 32.7*  MCV 89.3   < > 89.0 89.8  PLT 376   < > 393 366   < > = values in this interval not displayed.   Basic Metabolic Panel Recent Labs    01/07/18 0428 01/08/18 0442  NA 130* 133*  K 3.1* 3.4*  CL 81* 86*  CO2 36* 36*  GLUCOSE 92 97  BUN 48* 37*  CREATININE 1.25*  1.09*  CALCIUM 8.2* 8.0*   Liver Function Tests No results for input(s): AST, ALT, ALKPHOS, BILITOT, PROT, ALBUMIN in the last 72 hours. No results for input(s): LIPASE, AMYLASE in the last 72 hours. Cardiac Enzymes No results for input(s): CKTOTAL, CKMB, CKMBINDEX, TROPONINI in the last 72 hours.  BNP: BNP (last 3 results) Recent Labs    12/25/17 1737  BNP 2,089.7*    ProBNP (last 3 results) No results for input(s): PROBNP in the last 8760 hours.   D-Dimer No results for input(s): DDIMER in the last 72 hours. Hemoglobin A1C No results for input(s): HGBA1C in the last 72 hours. Fasting Lipid Panel No results for input(s): CHOL, HDL, LDLCALC, TRIG, CHOLHDL, LDLDIRECT in the last 72 hours. Thyroid Function Tests No results for input(s): TSH, T4TOTAL, T3FREE, THYROIDAB in the last 72  hours.  Invalid input(s): FREET3  Other results:   Imaging    No results found.   Medications:     Scheduled Medications: . apixaban  5 mg Oral BID  . atorvastatin  80 mg Oral QHS  . dextromethorphan-guaiFENesin  1 tablet Oral BID  . famotidine  20 mg Oral BID  . fluticasone furoate-vilanterol  1 puff Inhalation Daily  . folic acid  1 mg Oral Daily  . hydrocortisone  25 mg Rectal TID  . insulin aspart  0-9 Units Subcutaneous TID WC  . insulin glargine  5 Units Subcutaneous Daily  . levothyroxine  100 mcg Oral QAC breakfast  . macitentan  10 mg Oral Daily  . metoprolol tartrate  25 mg Oral BID  . multivitamin  1 tablet Oral Daily  . multivitamin with minerals  1 tablet Oral Daily  . predniSONE  40 mg Oral Q breakfast  . ranolazine  500 mg Oral BID  . sildenafil  40 mg Oral TID  . sodium chloride flush  10-40 mL Intracatheter Q12H  . spironolactone  25 mg Oral Daily  . sulfaSALAzine  1,000 mg Oral BID WC  . tiotropium  18 mcg Inhalation Daily  . torsemide  20 mg Oral BID    Infusions:   PRN Medications: acetaminophen, levalbuterol, ondansetron (ZOFRAN) IV, phenol, sodium chloride flush    Patient Profile  [name] is a 77 year old with history of Diabetes, HTN, hyperlipidemia, hypothyroidism, obesity, tachy/brady s/p PPM, COPD, GERD, Af ib, CAD, and pulmonary hypertension.   Admitted with increased dyspnea.   Assessment/Plan   1. 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 Continue torsemide 20 bid.  Adjust as needed.  - Continue spiro 25 mg daily.  - PYP scan negative for TTR amyloid  2. 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 -Out of macitentan about 10 days. Restarted on admit.  - On combination therapy. Macitentan 10 mg daily + sildenafil 20 mg three times a  day. On chronic oxygen. No change.   3. COPD - On chronic oxygen (night O2 at home). No change  4. Natasha Mead- with medtronic PPM  5. Permanent A fib  - Rate improved with restarting lopressor - Hgb stable on apixiban. 9.6 this am.   6. CAD  Cath 2013 with LAD 70-75% and ostial 95% D2.  - No s/s ischemia.  - On statin and eliquis   7. DM2 - On insulin per Primary. No  change.  - Consider Jardiance  8. CKD Stage III, baseline creatinine 1.3-1.6 - Creatinine stable at 1.09 this am.  - BMET daily  9. Deconditioing - PT recommending SNF. Delays with finding available bed yesterday. She has to bring in Macitentan and Ranexa because the facility cannot provide.  - Encouraged her to get OOB.  - Plans to go to SNF today.   10. Hypokalemia - K 3.4 this am. Will supp.  11.Leukocytosis - Back up to 12.4 this am. Afebrile.  - CXR, UA and bcx remaining negative. Flu swab negative. Resp panel + for rhinovirus.  - Continue incentive spirometer  12. Hyponatremia - Na 133. Stable  Feels terrible, but seems stable from cardiology perspective.   Length of Stay: [place], NP  01/08/2018, 1:33 PM  Advanced Heart Failure Team Pager [phone] (M-F; 7a - 4p)  Please contact [facility] for night-coverage after hours (4p -7a ) and weekends on [email or web address]  Patient seen and examined with the above-signed Advanced Practice Provider and/or Housestaff. I personally reviewed laboratory data, imaging studies and relevant notes. I independently examined the patient and formulated the important aspects of the plan. I have edited the note to reflect any of my changes or salient points. I have personally discussed the plan with the patient and/or family.  She continues to feel rundown and weak. However from cardiac perspective I think she is about as good as we can get her. Ok to d/c to SNF from cardiac perspective on current meds (not previous home meds). Will follow up as  outpatient.   Glori Bickers, MD  6:22 PM

## 2018-01-08 NOTE — Progress Notes (Signed)
Patient will discharge to [facility]. Anticipated discharge date: 01/08/18 Family notified: patient will call family  Transportation by: PTAR  Nurse to call report to [phone].   CSW signing off.  Estanislado Emms, [place]  Clinical Social Worker

## 2018-01-08 NOTE — Progress Notes (Signed)
Instructed pt on procedure. HOB less than 45*. Held breath upon line removal.  Pressure held for 5 min. No s/sx of bleeding noted. Instructed to remain in bed for 72min and monitor for s/sx of bleeding. Instructed to leave drsg CDI for 24 hours. Pt VU. Fran Lowes, RN VAST

## 2018-01-08 NOTE — Plan of Care (Signed)
  Problem: Education: Goal: Knowledge of General Education information will improve Outcome: Completed/Met   Problem: Health Behavior/Discharge Planning: Goal: Ability to manage health-related needs will improve Outcome: Completed/Met   Problem: Clinical Measurements: Goal: Ability to maintain clinical measurements within normal limits will improve Outcome: Completed/Met Goal: Will remain free from infection Outcome: Completed/Met Goal: Diagnostic test results will improve Outcome: Completed/Met Goal: Respiratory complications will improve Outcome: Completed/Met Goal: Cardiovascular complication will be avoided Outcome: Completed/Met   Problem: Activity: Goal: Risk for activity intolerance will decrease Outcome: Completed/Met   Problem: Coping: Goal: Level of anxiety will decrease Outcome: Completed/Met   Problem: Elimination: Goal: Will not experience complications related to bowel motility Outcome: Completed/Met Goal: Will not experience complications related to urinary retention Outcome: Completed/Met   Problem: Education: Goal: Ability to demonstrate management of disease process will improve Outcome: Completed/Met Goal: Ability to verbalize understanding of medication therapies will improve Outcome: Completed/Met   Problem: Activity: Goal: Capacity to carry out activities will improve Outcome: Completed/Met   Problem: Cardiac: Goal: Ability to achieve and maintain adequate cardiopulmonary perfusion will improve Outcome: Completed/Met

## 2018-01-09 ENCOUNTER — Encounter: Payer: Self-pay | Admitting: Adult Health

## 2018-01-09 ENCOUNTER — Non-Acute Institutional Stay (SKILLED_NURSING_FACILITY): Payer: Medicare Other | Admitting: Adult Health

## 2018-01-09 ENCOUNTER — Other Ambulatory Visit (HOSPITAL_COMMUNITY): Payer: Self-pay | Admitting: *Deleted

## 2018-01-09 DIAGNOSIS — K5010 Crohn's disease of large intestine without complications: Secondary | ICD-10-CM

## 2018-01-09 DIAGNOSIS — E210 Primary hyperparathyroidism: Secondary | ICD-10-CM

## 2018-01-09 DIAGNOSIS — N183 Chronic kidney disease, stage 3 unspecified: Secondary | ICD-10-CM

## 2018-01-09 DIAGNOSIS — J438 Other emphysema: Secondary | ICD-10-CM | POA: Diagnosis not present

## 2018-01-09 DIAGNOSIS — I272 Pulmonary hypertension, unspecified: Secondary | ICD-10-CM

## 2018-01-09 DIAGNOSIS — I2510 Atherosclerotic heart disease of native coronary artery without angina pectoris: Secondary | ICD-10-CM | POA: Diagnosis not present

## 2018-01-09 DIAGNOSIS — E1169 Type 2 diabetes mellitus with other specified complication: Secondary | ICD-10-CM | POA: Diagnosis not present

## 2018-01-09 DIAGNOSIS — Z950 Presence of cardiac pacemaker: Secondary | ICD-10-CM | POA: Diagnosis not present

## 2018-01-09 DIAGNOSIS — I50813 Acute on chronic right heart failure: Secondary | ICD-10-CM | POA: Diagnosis not present

## 2018-01-09 DIAGNOSIS — J206 Acute bronchitis due to rhinovirus: Secondary | ICD-10-CM

## 2018-01-09 DIAGNOSIS — I482 Chronic atrial fibrillation, unspecified: Secondary | ICD-10-CM

## 2018-01-09 DIAGNOSIS — Z794 Long term (current) use of insulin: Secondary | ICD-10-CM

## 2018-01-09 DIAGNOSIS — E785 Hyperlipidemia, unspecified: Secondary | ICD-10-CM

## 2018-01-09 DIAGNOSIS — E1121 Type 2 diabetes mellitus with diabetic nephropathy: Secondary | ICD-10-CM

## 2018-01-09 DIAGNOSIS — M1A9XX0 Chronic gout, unspecified, without tophus (tophi): Secondary | ICD-10-CM

## 2018-01-09 DIAGNOSIS — E034 Atrophy of thyroid (acquired): Secondary | ICD-10-CM

## 2018-01-09 MED ORDER — MACITENTAN 10 MG PO TABS: 10.0000 mg | ORAL_TABLET | Freq: Every day | ORAL | 11 refills | Status: AC

## 2018-01-09 NOTE — Progress Notes (Signed)
Location:   [facility] Room Number: [place] of Service:  SNF (31)   CODE STATUS: DNR  No Known Allergies  Chief Complaint  Patient presents with  . Hospitalization Follow-up    hospital follow up    HPI:  She is an 77 year old who has been hospitalized from 12-25-17 through 01-08-18. She has a medical history of diabetes; hypertension; OS; COPD; she does live by herself. She had been hospitalized for acute on chronic combined heart failure; acute bronchitis. She was started on prednisone; was discharged on 40 mg daily and will need a slow tapering of her dose.  She is here for short term rehab with her goal to return home. She does have a cough; shortness of breath with talking; and does deny any changes in appetite. She will continue to be followed for her chronic illnesses including:  Hypertension; right side heart failure; afib. There are no nursing concerns at this time.    Past Medical History:  Diagnosis Date  . Atrial fibrillation ([place])    Status post pulmonary vein isolation 2009 at [facility]  . Atrial fibrillation ([place]) 12/18/2007   Annotation: refractory Qualifier: Diagnosis of  By: Doy Mince LPN, Megan    . CAD (coronary artery disease)    LHC 05/2007: pLAD 70-80%, pRCA 40%, EF 20-25%. LAD lesion treated medically.   . CHF (congestive heart failure) ([place])   . Chronic diastolic heart failure ([place])    Echo 06/2010: Moderate LVH, EF 55-65%, normal wall motion, mild MR, moderate to severe LAE, mild RAE.   Marland Kitchen Chronic obstructive pulmonary disease ([place])   . COPD (chronic obstructive pulmonary disease) ([place])   . Coronary artery disease 06/15/2012  . Crohn's disease ([place])   . Depression   . Diabetes mellitus ([place]) 06/22/2012  . Diabetes mellitus without complication ([place])   . Dysrhythmia    atrial fib  . GERD (gastroesophageal reflux disease)   . Gout   . Hyperlipidemia   . Hypothyroidism    treated  . Morbid obesity ([place])   . Morbid  obesity with BMI of 40.0-44.9, adult ([place]) 06/22/2012  . Nonischemic cardiomyopathy ([place])    history of,  EF 20-25% at left heart cath in 06/2007; echo 2011 had normal LV function  . Obstructive sleep apnea    continuous positive airway pressure not using at present  . On home oxygen therapy    "2L at night" (10/03/2015)  . Osteoporosis   . Pacemaker    Medtronic  . Peripheral vascular disease (H[facility]) 15   rt arm clots  . Seasonal allergies   . Shortness of breath dyspnea    exersion  . Tachy-brady syndrome ([place])    status post implant of a medtronic dual-chamber pacemaker in 2001.  explanted in 2010 -- [place] pacemaker in 2010.    Past Surgical History:  Procedure Laterality Date  . CARDIAC CATHETERIZATION  08/2000   noncritical disease mid RCA, EF preserved  . CARDIAC CATHETERIZATION  05/29/2007   noncritical disease, EF 20-25%  . CARDIAC CATHETERIZATION N/A 01/09/2016   Procedure: Right Heart Cath;  Surgeon: Jolaine Artist, MD;  Location: [place] [facility];  Service: Cardiovascular;  Laterality: N/A;  . CARDIAC CATHETERIZATION N/A 07/15/2016   Procedure: Right Heart Cath;  Surgeon: Jolaine Artist, MD;  Location: [place] [facility];  Service: Cardiovascular;  Laterality: N/A;  . CATARACT EXTRACTION W/ INTRAOCULAR LENS  IMPLANT, BILATERAL Bilateral 2014-2016  .  EMBOLECTOMY Right 08/16/2014   Procedure: EMBOLECTOMY- RIGHT BRACHIAL ARTERY;  Surgeon: Serafina Mitchell, MD;  Location: [place];  Service: Vascular;  Laterality: Right;  . PACEMAKER INSERTION  2010   medtronic ADAPTA   . PARATHYROIDECTOMY Right 01/30/2015   Procedure: PARATHYROIDECTOMY;  Surgeon: Armandina Gemma, MD;  Location: [place];  Service: General;  Laterality: Right;  . PARATHYROIDECTOMY Left 10/03/2015   Left inferior parathyroidectomy w/neck exploration  . PARATHYROIDECTOMY Left 10/03/2015   Procedure: LEFT PARATHYROID EXPLORATION AND  PARATHYROIDECTOMY;  Surgeon: Armandina Gemma, MD;  Location: [place];   Service: General;  Laterality: Left;  . post ablation pseudoaneurysm     at A fib ablation  . pulmonary vein isolation  05/12/2008   RFCA atrial fibrillation  . TONSILLECTOMY      Social History   Socioeconomic History  . Marital status: Divorced    Spouse name: Not on file  . Number of children: Not on file  . Years of education: Not on file  . Highest education level: Not on file  Occupational History  . Occupation: retired  Scientific laboratory technician  . Financial resource strain: Not on file  . Food insecurity:    Worry: Not on file    Inability: Not on file  . Transportation needs:    Medical: Not on file    Non-medical: Not on file  Tobacco Use  . Smoking status: Former Smoker    Packs/day: 3.00    Years: 32.00    Pack years: 96.00    Last attempt to quit: 09/30/1986    Years since quitting: 31.2  . Smokeless tobacco: Former Systems developer    Quit date: 08/15/1987  Substance and Sexual Activity  . Alcohol use: No    Alcohol/week: 0.0 oz  . Drug use: No  . Sexual activity: Never  Lifestyle  . Physical activity:    Days per week: Not on file    Minutes per session: Not on file  . Stress: Not on file  Relationships  . Social connections:    Talks on phone: Not on file    Gets together: Not on file    Attends religious service: Not on file    Active member of club or organization: Not on file    Attends meetings of clubs or organizations: Not on file    Relationship status: Not on file  . Intimate partner violence:    Fear of current or ex partner: Not on file    Emotionally abused: Not on file    Physically abused: Not on file    Forced sexual activity: Not on file  Other Topics Concern  . Not on file  Social History Narrative   ** Merged History Encounter **       Family History  Problem Relation Age of Onset  . Breast cancer Mother   . Heart disease Father   . Emphysema Father       VITAL SIGNS BP 112/76   Pulse (!) 107   Temp (!) 97.5 F (36.4 C)   Ht 5\' 10"   (1.778 m)   Wt 196 lb 5 oz (89 kg)   SpO2 94%   BMI 28.17 kg/m   Outpatient Encounter Medications as of 01/09/2018  Medication Sig  . acetaminophen (TYLENOL) 325 MG tablet Take 2 tablets (650 mg total) by mouth every 6 (six) hours as needed for mild pain, fever or headache.  . ADVAIR DISKUS 500-50 MCG/DOSE AEPB Inhale 1 puff into the lungs every 12 (twelve)  hours.  . albuterol (PROVENTIL HFA;VENTOLIN HFA) 108 (90 BASE) MCG/ACT inhaler Inhale 2 puffs into the lungs every 6 (six) hours as needed for wheezing or shortness of breath.  Marland Kitchen apixaban (ELIQUIS) 5 MG TABS tablet Take 1 tablet (5 mg total) by mouth 2 (two) times daily.  Marland Kitchen atorvastatin (LIPITOR) 80 MG tablet Take 80 mg by mouth at bedtime.    . colchicine 0.6 MG tablet Take 0.6-1.2 mg by mouth daily as needed (for gout flares). Reported on 03/02/6947  . folic acid (FOLVITE) 1 MG tablet Take 1 mg by mouth daily.  Marland Kitchen LANTUS 100 UNIT/ML injection Inject 0.05 mLs (5 Units total) into the skin daily before breakfast.  . levothyroxine (SYNTHROID, LEVOTHROID) 100 MCG tablet Take 100 mcg by mouth daily.  . Macitentan (OPSUMIT) 10 MG TABS Take 10 mg by mouth daily.  . metoprolol tartrate (LOPRESSOR) 25 MG tablet Take 1 tablet (25 mg total) by mouth 2 (two) times daily.  . Multiple Vitamins-Minerals (MULTIVITAMIN WITH MINERALS) tablet Take 1 tablet by mouth daily.  . nitroGLYCERIN (NITROSTAT) 0.4 MG SL tablet Place 1 tablet (0.4 mg total) under the tongue every 5 (five) minutes as needed for chest pain.  Marland Kitchen omeprazole (PRILOSEC) 20 MG capsule Take 20 mg by mouth daily.  . potassium chloride SA (K-DUR,KLOR-CON) 20 MEQ tablet Take 20 mEq by mouth 2 (two) times daily.  . predniSONE (DELTASONE) 20 MG tablet Take 2 tablets (40 mg total) by mouth daily with breakfast.  . ranolazine (RANEXA) 500 MG 12 hr tablet Take 500 mg by mouth 2 (two) times daily.  . sildenafil (REVATIO) 20 MG tablet Take 2 tablets (40 mg total) by mouth 3 (three) times daily.  Marland Kitchen  spironolactone (ALDACTONE) 25 MG tablet Take 0.5 tablets (12.5 mg total) by mouth daily.  Marland Kitchen sulfaSALAzine (AZULFIDINE) 500 MG tablet Take 1,000 mg by mouth 2 (two) times daily.  . Tiotropium Bromide Monohydrate (SPIRIVA RESPIMAT) 2.5 MCG/ACT AERS INHALE 2 PUFFS INTO THE LUNGS DAILY.  Marland Kitchen torsemide (DEMADEX) 20 MG tablet Take 1 tablet (20 mg total) by mouth 2 (two) times daily.   No facility-administered encounter medications on file as of 01/09/2018.      SIGNIFICANT DIAGNOSTIC EXAMS  TODAY:   12-25-17: chest x-ray: Small left pleural effusion with mild left base atelectasis. No edema or consolidation. Cardiomegaly. Calcification in the left anterior descending coronary artery. Pacemaker leads as described. No pneumothorax  12-30-17: 2-echo:   - Left ventricle: Septal flattening indicative of elevated PA pressures. Wall thickness was increased in a pattern of moderate  LVH. Systolic function was normal. The estimated ejection fraction was in the range of 55% to 60%. The study is not  technically sufficient to allow evaluation of LV diastolic function. - Mitral valve: There was mild regurgitation. - Left atrium: The atrium was severely dilated. - Right ventricle: The cavity size was moderately dilated. - Right atrium: The atrium was severely dilated. - Atrial septum: No defect or patent foramen ovale was identified. - Tricuspid valve: There was moderate regurgitation. - Pulmonary arteries: PA peak pressure: 58 mm Hg (S).  01-03-18: chest x-ray: Stable chronic lung disease, interstitial/vascular prominence, left lower lobe atelectasis and cardiac enlargement.  01-05-18: NM tumor localization PYP cardiac amyloidosis scan with spect. : Visual and quantitative assessment (grade 1, H/CL equal 1.28) are not suggestive of transthyretin amyloidosis.   LABS REVIEWED: TODAY:   12-25-17: wbc 8.1; hgb 11.2; hct 39.2; mcv 90.1; plt 383; glucose 106; bun 43; creat 1.67; k+ 3.4;  na++ 138; ca 8.0; BNP  2089.7 12-28-17: glucose 124; bun 43; creat 1.65; k+ 3.6; na++ 137; ca 8.1 12-30-17: glucose 123; bun 46; creat 1.35; k+ 4.5; na++ 134; ca 8.2 12-31-17: wbc 5.9; hgb 11.1; hct 38.0; mcv 90.0; plt 413; glucose 129; bun 43; creat 1.45; k+ 3.8; na++134; ca 8.4; mag 2.1; hgb a1c 5.8; iron 44; tibc 300; ferritin 236 01-01-18: bit B 12: 922; folate 12.7 01-03-18: wbc 15.7; hgb 11.8; hct 39.7; plt 90.6; plt 369; glucose 118; bun 40; creat 1.35; k+ 4.6; na++ 132; ca 9.0; mag 2.5;  01-04-18: urine culture: multiple bacteria 01-08-18: wbc 12.4; hgb 9.6; hct 32.7; mcv 89.8; plt 366; glucose 97; bun 37; creat 1.09;k+ 3.4; na++ 133; ca 8.0      Review of Systems  Constitutional: Positive for malaise/fatigue.  Respiratory: Positive for cough and shortness of breath.        Using 02 during the day (used only at night prior to hospitalization)  Cardiovascular: Negative for chest pain, palpitations and leg swelling.  Gastrointestinal: Negative for abdominal pain, constipation and heartburn.  Musculoskeletal: Negative for back pain, joint pain and myalgias.  Skin: Negative.   Neurological: Negative for dizziness.  Psychiatric/Behavioral: The patient is not nervous/anxious.      Physical Exam  Constitutional: She is oriented to person, place, and time. She appears well-developed and well-nourished. No distress.  Neck: No thyromegaly present.  Cardiovascular: Normal rate, regular rhythm and intact distal pulses.  Murmur heard. 1/6  Pulmonary/Chest: Effort normal. No respiratory distress.  Breath sounds diminished throughout 02 dependent   Abdominal: Soft. Bowel sounds are normal. She exhibits no distension. There is no tenderness.  Musculoskeletal: She exhibits edema.  Is able to move all extremities Has trace bilateral lower extremity edema   Lymphadenopathy:    She has no cervical adenopathy.  Neurological: She is alert and oriented to person, place, and time.  Skin: Skin is warm and dry. She is not  diaphoretic.  Psychiatric: She has a normal mood and affect.      ASSESSMENT/ PLAN:  TODAY:   1. Acute on chronic right sided congestive heart failure: stable EF 55-60%(12-30-17): will continue demadex 20 mg twice daily with k+ 20 meq twice daily lopressor 25 mg twice daily and aldactone 12.5 mg daily   2. Chronic atrial fibrillation:  Stable is status post pace maker insertion will continue lopressor 25 mg twice daily for rate control; and eliquis 5 mg twice daily   3. Hypertensive heart disease with CHF: stable b/p 112/76: will continue lopressor 25 mg twice daily   4. Coronary heart disease: is stable no complaint of chest pain: has ntg prn take ranexa 1 gm twice daily   5. Pulmonary hypertension: RHC on 01-09-16: is stable will continue revatio 40 mg three times daily;  revatio 40 mg twice daily and opsumit 10 mg daily   6.  COPD with emphysema: has acute bronchitis:  without change: is using 02 at 2L/[place] continuous; will continue advair 500/50 twice daily spiriva respimat 2.5 mcg 2 puffs daily; has albuterol 2 puffs every 6 hours as needed  Will continue prednisone 40 mg daily will begin her on slow taper early next week.   7. Crohn's disease of large intestine without complication: stable will continue azulfidine 1 gm twice daily   8. Gastro-esophageal reflux disease without esophagitis: stable will continue prilosec 20 mg daily   9. Primary hyperparathyroidism: is stable is status post parathyroidectomy  10.  Hypothyroidism due to acquired  atrophy of thyroid: stable will continue synthroid 100 mcg daily   11. 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 Dyslipidemia associated with type 2 diabetes mellitus: stable will continue lipitor 80 mg daily   13. CKD stage 3 GFR 30-59 ml/min: stable bun 37; creat 1.09  14. Chronic gout: stable has colchicine 0.6 mg daily as needed   Will check cbc; bmp     MD is aware of resident's narcotic use and is in agreement with current plan of care. We will attempt to wean resident as apropriate   Ok Edwards NP [facility]  Contact [phone] Monday through Friday 8am- 5pm  After hours call [phone]

## 2018-01-10 DIAGNOSIS — E1169 Type 2 diabetes mellitus with other specified complication: Secondary | ICD-10-CM | POA: Insufficient documentation

## 2018-01-10 DIAGNOSIS — E785 Hyperlipidemia, unspecified: Secondary | ICD-10-CM

## 2018-01-11 ENCOUNTER — Other Ambulatory Visit: Payer: Self-pay

## 2018-01-11 ENCOUNTER — Inpatient Hospital Stay (HOSPITAL_COMMUNITY)
Admission: EM | Admit: 2018-01-11 | Discharge: 2018-01-28 | DRG: 291 | Disposition: E | Payer: Medicare Other | Attending: Pulmonary Disease | Admitting: Pulmonary Disease

## 2018-01-11 ENCOUNTER — Encounter (HOSPITAL_COMMUNITY): Payer: Self-pay

## 2018-01-11 ENCOUNTER — Other Ambulatory Visit (HOSPITAL_COMMUNITY): Payer: Self-pay | Admitting: Internal Medicine

## 2018-01-11 ENCOUNTER — Emergency Department (HOSPITAL_COMMUNITY): Payer: Medicare Other

## 2018-01-11 DIAGNOSIS — Z803 Family history of malignant neoplasm of breast: Secondary | ICD-10-CM

## 2018-01-11 DIAGNOSIS — Z515 Encounter for palliative care: Secondary | ICD-10-CM | POA: Diagnosis not present

## 2018-01-11 DIAGNOSIS — M109 Gout, unspecified: Secondary | ICD-10-CM | POA: Diagnosis present

## 2018-01-11 DIAGNOSIS — I509 Heart failure, unspecified: Secondary | ICD-10-CM

## 2018-01-11 DIAGNOSIS — E892 Postprocedural hypoparathyroidism: Secondary | ICD-10-CM | POA: Diagnosis present

## 2018-01-11 DIAGNOSIS — N179 Acute kidney failure, unspecified: Secondary | ICD-10-CM | POA: Diagnosis present

## 2018-01-11 DIAGNOSIS — E785 Hyperlipidemia, unspecified: Secondary | ICD-10-CM | POA: Diagnosis present

## 2018-01-11 DIAGNOSIS — Z961 Presence of intraocular lens: Secondary | ICD-10-CM | POA: Diagnosis present

## 2018-01-11 DIAGNOSIS — I469 Cardiac arrest, cause unspecified: Secondary | ICD-10-CM | POA: Diagnosis not present

## 2018-01-11 DIAGNOSIS — I480 Paroxysmal atrial fibrillation: Secondary | ICD-10-CM | POA: Diagnosis present

## 2018-01-11 DIAGNOSIS — Z66 Do not resuscitate: Secondary | ICD-10-CM | POA: Diagnosis present

## 2018-01-11 DIAGNOSIS — J9601 Acute respiratory failure with hypoxia: Secondary | ICD-10-CM | POA: Diagnosis not present

## 2018-01-11 DIAGNOSIS — Z950 Presence of cardiac pacemaker: Secondary | ICD-10-CM

## 2018-01-11 DIAGNOSIS — Z9114 Patient's other noncompliance with medication regimen: Secondary | ICD-10-CM

## 2018-01-11 DIAGNOSIS — E875 Hyperkalemia: Secondary | ICD-10-CM | POA: Diagnosis not present

## 2018-01-11 DIAGNOSIS — I428 Other cardiomyopathies: Secondary | ICD-10-CM | POA: Diagnosis present

## 2018-01-11 DIAGNOSIS — R531 Weakness: Secondary | ICD-10-CM

## 2018-01-11 DIAGNOSIS — Z7951 Long term (current) use of inhaled steroids: Secondary | ICD-10-CM

## 2018-01-11 DIAGNOSIS — I482 Chronic atrial fibrillation, unspecified: Secondary | ICD-10-CM | POA: Diagnosis present

## 2018-01-11 DIAGNOSIS — Z794 Long term (current) use of insulin: Secondary | ICD-10-CM

## 2018-01-11 DIAGNOSIS — Z9842 Cataract extraction status, left eye: Secondary | ICD-10-CM

## 2018-01-11 DIAGNOSIS — Z79899 Other long term (current) drug therapy: Secondary | ICD-10-CM

## 2018-01-11 DIAGNOSIS — E669 Obesity, unspecified: Secondary | ICD-10-CM | POA: Diagnosis present

## 2018-01-11 DIAGNOSIS — I2510 Atherosclerotic heart disease of native coronary artery without angina pectoris: Secondary | ICD-10-CM | POA: Diagnosis present

## 2018-01-11 DIAGNOSIS — N183 Chronic kidney disease, stage 3 unspecified: Secondary | ICD-10-CM | POA: Diagnosis present

## 2018-01-11 DIAGNOSIS — R069 Unspecified abnormalities of breathing: Secondary | ICD-10-CM

## 2018-01-11 DIAGNOSIS — J181 Lobar pneumonia, unspecified organism: Secondary | ICD-10-CM

## 2018-01-11 DIAGNOSIS — R6521 Severe sepsis with septic shock: Secondary | ICD-10-CM | POA: Diagnosis not present

## 2018-01-11 DIAGNOSIS — I272 Pulmonary hypertension, unspecified: Secondary | ICD-10-CM | POA: Diagnosis present

## 2018-01-11 DIAGNOSIS — Z8249 Family history of ischemic heart disease and other diseases of the circulatory system: Secondary | ICD-10-CM

## 2018-01-11 DIAGNOSIS — R0902 Hypoxemia: Secondary | ICD-10-CM

## 2018-01-11 DIAGNOSIS — F411 Generalized anxiety disorder: Secondary | ICD-10-CM | POA: Diagnosis present

## 2018-01-11 DIAGNOSIS — I50813 Acute on chronic right heart failure: Secondary | ICD-10-CM | POA: Diagnosis not present

## 2018-01-11 DIAGNOSIS — J9622 Acute and chronic respiratory failure with hypercapnia: Secondary | ICD-10-CM | POA: Diagnosis not present

## 2018-01-11 DIAGNOSIS — J189 Pneumonia, unspecified organism: Secondary | ICD-10-CM

## 2018-01-11 DIAGNOSIS — Z7952 Long term (current) use of systemic steroids: Secondary | ICD-10-CM

## 2018-01-11 DIAGNOSIS — E039 Hypothyroidism, unspecified: Secondary | ICD-10-CM | POA: Diagnosis present

## 2018-01-11 DIAGNOSIS — Z9289 Personal history of other medical treatment: Secondary | ICD-10-CM

## 2018-01-11 DIAGNOSIS — A419 Sepsis, unspecified organism: Secondary | ICD-10-CM | POA: Diagnosis not present

## 2018-01-11 DIAGNOSIS — E1121 Type 2 diabetes mellitus with diabetic nephropathy: Secondary | ICD-10-CM | POA: Diagnosis present

## 2018-01-11 DIAGNOSIS — J441 Chronic obstructive pulmonary disease with (acute) exacerbation: Secondary | ICD-10-CM | POA: Diagnosis present

## 2018-01-11 DIAGNOSIS — Z87891 Personal history of nicotine dependence: Secondary | ICD-10-CM

## 2018-01-11 DIAGNOSIS — I2729 Other secondary pulmonary hypertension: Secondary | ICD-10-CM | POA: Diagnosis present

## 2018-01-11 DIAGNOSIS — E1151 Type 2 diabetes mellitus with diabetic peripheral angiopathy without gangrene: Secondary | ICD-10-CM | POA: Diagnosis present

## 2018-01-11 DIAGNOSIS — J9621 Acute and chronic respiratory failure with hypoxia: Secondary | ICD-10-CM | POA: Diagnosis not present

## 2018-01-11 DIAGNOSIS — M1A9XX0 Chronic gout, unspecified, without tophus (tophi): Secondary | ICD-10-CM | POA: Diagnosis present

## 2018-01-11 DIAGNOSIS — M810 Age-related osteoporosis without current pathological fracture: Secondary | ICD-10-CM | POA: Diagnosis present

## 2018-01-11 DIAGNOSIS — R0602 Shortness of breath: Secondary | ICD-10-CM | POA: Diagnosis present

## 2018-01-11 DIAGNOSIS — K219 Gastro-esophageal reflux disease without esophagitis: Secondary | ICD-10-CM | POA: Diagnosis present

## 2018-01-11 DIAGNOSIS — I5043 Acute on chronic combined systolic (congestive) and diastolic (congestive) heart failure: Secondary | ICD-10-CM | POA: Diagnosis present

## 2018-01-11 DIAGNOSIS — J9602 Acute respiratory failure with hypercapnia: Secondary | ICD-10-CM | POA: Diagnosis not present

## 2018-01-11 DIAGNOSIS — I130 Hypertensive heart and chronic kidney disease with heart failure and stage 1 through stage 4 chronic kidney disease, or unspecified chronic kidney disease: Principal | ICD-10-CM | POA: Diagnosis present

## 2018-01-11 DIAGNOSIS — J9690 Respiratory failure, unspecified, unspecified whether with hypoxia or hypercapnia: Secondary | ICD-10-CM

## 2018-01-11 DIAGNOSIS — Z79890 Hormone replacement therapy (postmenopausal): Secondary | ICD-10-CM

## 2018-01-11 DIAGNOSIS — K5010 Crohn's disease of large intestine without complications: Secondary | ICD-10-CM | POA: Diagnosis present

## 2018-01-11 DIAGNOSIS — Z6826 Body mass index (BMI) 26.0-26.9, adult: Secondary | ICD-10-CM

## 2018-01-11 DIAGNOSIS — Z9841 Cataract extraction status, right eye: Secondary | ICD-10-CM

## 2018-01-11 DIAGNOSIS — E1122 Type 2 diabetes mellitus with diabetic chronic kidney disease: Secondary | ICD-10-CM | POA: Diagnosis present

## 2018-01-11 DIAGNOSIS — J439 Emphysema, unspecified: Secondary | ICD-10-CM | POA: Diagnosis present

## 2018-01-11 DIAGNOSIS — Z9981 Dependence on supplemental oxygen: Secondary | ICD-10-CM

## 2018-01-11 DIAGNOSIS — G9340 Encephalopathy, unspecified: Secondary | ICD-10-CM | POA: Diagnosis not present

## 2018-01-11 DIAGNOSIS — Z9119 Patient's noncompliance with other medical treatment and regimen: Secondary | ICD-10-CM

## 2018-01-11 DIAGNOSIS — Z7901 Long term (current) use of anticoagulants: Secondary | ICD-10-CM

## 2018-01-11 DIAGNOSIS — G4733 Obstructive sleep apnea (adult) (pediatric): Secondary | ICD-10-CM | POA: Diagnosis present

## 2018-01-11 DIAGNOSIS — Z825 Family history of asthma and other chronic lower respiratory diseases: Secondary | ICD-10-CM

## 2018-01-11 LAB — I-STAT TROPONIN, ED: Troponin i, poc: 0.11 ng/mL (ref 0.00–0.08)

## 2018-01-11 LAB — CBC WITH DIFFERENTIAL/PLATELET
BASOS PCT: 0 %
Basophils Absolute: 0 10*3/uL (ref 0.0–0.1)
EOS ABS: 0 10*3/uL (ref 0.0–0.7)
EOS PCT: 0 %
HEMATOCRIT: 36.4 % (ref 36.0–46.0)
HEMOGLOBIN: 10.6 g/dL — AB (ref 12.0–15.0)
LYMPHS ABS: 0.9 10*3/uL (ref 0.7–4.0)
Lymphocytes Relative: 7 %
MCH: 26.4 pg (ref 26.0–34.0)
MCHC: 29.1 g/dL — ABNORMAL LOW (ref 30.0–36.0)
MCV: 90.8 fL (ref 78.0–100.0)
MONO ABS: 1.1 10*3/uL — AB (ref 0.1–1.0)
Monocytes Relative: 9 %
Neutro Abs: 10.3 10*3/uL — ABNORMAL HIGH (ref 1.7–7.7)
Neutrophils Relative %: 84 %
Platelets: 494 10*3/uL — ABNORMAL HIGH (ref 150–400)
RBC: 4.01 MIL/uL (ref 3.87–5.11)
RDW: 26.2 % — AB (ref 11.5–15.5)
WBC: 12.3 10*3/uL — AB (ref 4.0–10.5)

## 2018-01-11 LAB — COMPREHENSIVE METABOLIC PANEL
ALK PHOS: 206 U/L — AB (ref 38–126)
ALT: 53 U/L (ref 14–54)
AST: 88 U/L — AB (ref 15–41)
Albumin: 2.9 g/dL — ABNORMAL LOW (ref 3.5–5.0)
Anion gap: 14 (ref 5–15)
BUN: 47 mg/dL — AB (ref 6–20)
CALCIUM: 8 mg/dL — AB (ref 8.9–10.3)
CHLORIDE: 92 mmol/L — AB (ref 101–111)
CO2: 33 mmol/L — AB (ref 22–32)
CREATININE: 1.45 mg/dL — AB (ref 0.44–1.00)
GFR, EST AFRICAN AMERICAN: 39 mL/min — AB (ref >60)
GFR, EST NON AFRICAN AMERICAN: 34 mL/min — AB (ref >60)
Glucose, Bld: 141 mg/dL — ABNORMAL HIGH (ref 65–99)
Potassium: 3.7 mmol/L (ref 3.5–5.1)
Sodium: 139 mmol/L (ref 135–145)
Total Bilirubin: 1.2 mg/dL (ref 0.3–1.2)
Total Protein: 7.1 g/dL (ref 6.5–8.1)

## 2018-01-11 LAB — HEMOGLOBIN A1C
HEMOGLOBIN A1C: 5.7 % — AB (ref 4.8–5.6)
MEAN PLASMA GLUCOSE: 116.89 mg/dL

## 2018-01-11 LAB — I-STAT CG4 LACTIC ACID, ED: LACTIC ACID, VENOUS: 1.9 mmol/L (ref 0.5–1.9)

## 2018-01-11 LAB — GLUCOSE, CAPILLARY
GLUCOSE-CAPILLARY: 118 mg/dL — AB (ref 65–99)
GLUCOSE-CAPILLARY: 181 mg/dL — AB (ref 65–99)
Glucose-Capillary: 98 mg/dL (ref 65–99)

## 2018-01-11 LAB — TROPONIN I
Troponin I: 0.09 ng/mL (ref <0.03)
Troponin I: 0.09 ng/mL (ref <0.03)

## 2018-01-11 LAB — BRAIN NATRIURETIC PEPTIDE: B Natriuretic Peptide: 868.2 pg/mL — ABNORMAL HIGH (ref 0.0–100.0)

## 2018-01-11 MED ORDER — ONDANSETRON HCL 4 MG PO TABS: 4.0000 mg | ORAL_TABLET | Freq: Four times a day (QID) | ORAL | Status: DC | PRN

## 2018-01-11 MED ORDER — ONDANSETRON HCL 4 MG/2ML IJ SOLN: 4.0000 mg | Freq: Four times a day (QID) | INTRAMUSCULAR | Status: DC | PRN

## 2018-01-11 MED ORDER — SENNOSIDES-DOCUSATE SODIUM 8.6-50 MG PO TABS
1.0000 | ORAL_TABLET | Freq: Every evening | ORAL | Status: DC | PRN
  Filled 2018-01-11: qty 1

## 2018-01-11 MED ORDER — INSULIN ASPART 100 UNIT/ML ~~LOC~~ SOLN
0.0000 [IU] | Freq: Three times a day (TID) | SUBCUTANEOUS | Status: DC
  Administered 2018-01-12: 1 [IU] via SUBCUTANEOUS
  Administered 2018-01-12: 2 [IU] via SUBCUTANEOUS
  Administered 2018-01-13: 1 [IU] via SUBCUTANEOUS

## 2018-01-11 MED ORDER — IPRATROPIUM-ALBUTEROL 0.5-2.5 (3) MG/3ML IN SOLN: 3.0000 mL | RESPIRATORY_TRACT | Status: DC | PRN

## 2018-01-11 MED ORDER — ORAL CARE MOUTH RINSE
15.0000 mL | Freq: Two times a day (BID) | OROMUCOSAL | Status: DC
  Administered 2018-01-11 – 2018-01-13 (×4): 15 mL via OROMUCOSAL

## 2018-01-11 MED ORDER — ACETAMINOPHEN 650 MG RE SUPP: 650.0000 mg | Freq: Four times a day (QID) | RECTAL | Status: DC | PRN

## 2018-01-11 MED ORDER — PREDNISONE 20 MG PO TABS
40.0000 mg | ORAL_TABLET | Freq: Every day | ORAL | Status: DC
  Administered 2018-01-12: 40 mg via ORAL
  Filled 2018-01-11: qty 2

## 2018-01-11 MED ORDER — ATORVASTATIN CALCIUM 80 MG PO TABS
80.0000 mg | ORAL_TABLET | Freq: Every day | ORAL | Status: DC
  Administered 2018-01-11 – 2018-01-12 (×2): 80 mg via ORAL
  Filled 2018-01-11 (×2): qty 1

## 2018-01-11 MED ORDER — AZITHROMYCIN 500 MG IV SOLR
250.0000 mg | INTRAVENOUS | Status: DC
  Administered 2018-01-11 – 2018-01-12 (×2): 250 mg via INTRAVENOUS
  Filled 2018-01-11 (×3): qty 250

## 2018-01-11 MED ORDER — HYDROCODONE-ACETAMINOPHEN 5-325 MG PO TABS: 1.0000 | ORAL_TABLET | ORAL | Status: DC | PRN

## 2018-01-11 MED ORDER — RANOLAZINE ER 500 MG PO TB12
500.0000 mg | ORAL_TABLET | Freq: Two times a day (BID) | ORAL | Status: DC
  Administered 2018-01-12: 500 mg via ORAL
  Filled 2018-01-11 (×3): qty 1

## 2018-01-11 MED ORDER — SULFASALAZINE 500 MG PO TABS
1000.0000 mg | ORAL_TABLET | Freq: Two times a day (BID) | ORAL | Status: DC
  Administered 2018-01-11 – 2018-01-12 (×3): 1000 mg via ORAL
  Filled 2018-01-11 (×5): qty 2

## 2018-01-11 MED ORDER — ALBUTEROL SULFATE (2.5 MG/3ML) 0.083% IN NEBU: 2.5000 mg | INHALATION_SOLUTION | RESPIRATORY_TRACT | Status: DC | PRN

## 2018-01-11 MED ORDER — INSULIN GLARGINE 100 UNIT/ML ~~LOC~~ SOLN
5.0000 [IU] | Freq: Every day | SUBCUTANEOUS | Status: DC
  Administered 2018-01-12: 5 [IU] via SUBCUTANEOUS
  Filled 2018-01-11 (×2): qty 0.05

## 2018-01-11 MED ORDER — ACETAMINOPHEN 325 MG PO TABS: 650.0000 mg | ORAL_TABLET | Freq: Four times a day (QID) | ORAL | Status: DC | PRN

## 2018-01-11 MED ORDER — LEVOTHYROXINE SODIUM 100 MCG PO TABS
100.0000 ug | ORAL_TABLET | Freq: Every day | ORAL | Status: DC
  Administered 2018-01-12: 100 ug via ORAL
  Filled 2018-01-11 (×2): qty 1

## 2018-01-11 MED ORDER — BISACODYL 10 MG RE SUPP: 10.0000 mg | Freq: Every day | RECTAL | Status: DC | PRN

## 2018-01-11 MED ORDER — APIXABAN 5 MG PO TABS
5.0000 mg | ORAL_TABLET | Freq: Two times a day (BID) | ORAL | Status: DC
  Administered 2018-01-11 – 2018-01-12 (×3): 5 mg via ORAL
  Filled 2018-01-11 (×4): qty 1

## 2018-01-11 MED ORDER — POTASSIUM CHLORIDE CRYS ER 20 MEQ PO TBCR
20.0000 meq | EXTENDED_RELEASE_TABLET | Freq: Two times a day (BID) | ORAL | Status: DC
Start: 2018-01-12 — End: 2018-01-13
  Administered 2018-01-12 (×2): 20 meq via ORAL
  Filled 2018-01-11 (×2): qty 1

## 2018-01-11 MED ORDER — FOLIC ACID 1 MG PO TABS
1.0000 mg | ORAL_TABLET | Freq: Every day | ORAL | Status: DC
  Administered 2018-01-12: 1 mg via ORAL
  Filled 2018-01-11 (×2): qty 1

## 2018-01-11 MED ORDER — POTASSIUM CHLORIDE CRYS ER 20 MEQ PO TBCR: 20.0000 meq | EXTENDED_RELEASE_TABLET | Freq: Two times a day (BID) | ORAL | Status: DC

## 2018-01-11 NOTE — ED Notes (Signed)
Irena Cords, PA at bedside with patient & caregiver at this time.

## 2018-01-11 NOTE — Progress Notes (Signed)
Unable to obtain orthostatic as patient declines to stand at this time.

## 2018-01-11 NOTE — ED Triage Notes (Signed)
Patient here with increasing SOB and lower edema swelling x 3 days after leaving rehab. Facility. Patient states recent hospital admit for CHF. Reports increased cough, sats 84% on 3L on arrival. Denies CP. Alert and oriented. hasn't taken lasix in 3 days

## 2018-01-11 NOTE — Progress Notes (Signed)
Pt refusing CPAP at this time. States she does not use at home.

## 2018-01-11 NOTE — H&P (Addendum)
History and Physical    [name] EPP:[id number] DOB: September 15, 1941 DOA: 01/23/2018   PCP: [name], MD   Patient coming from:  Home    Chief Complaint: Shortness of breath   HPI: [name] is a 77 y.o. female with medical history significant for diabetes, hypertension, Crohn's disease, depression, paroxysmal atrial fibrillation, OSA, COPD not recently using oxygen, CHF with EF 55%, chronic anemia, recent hospitalization from 12/25/2017 through 01/08/2018 for CHF exacerbation and acute bronchitis, discharged on prednisone,, discharged to rehab facility, leaving one day later because "could not stand it there ", since then, the patient over the last 3 days, has not been compliant with her meds, except today, when she decided to resume them.  However, she reported increasing cough, with frothy sputum, increasing shortness of breath, and was noted to have O2 sat in the 80s, requiring 3 L on arrival.  The patient denies any chest pain, or palpitations.  She denies any nausea or vomiting, she denies any diarrhea.  She does report increasing leg swellings, and uses bilateral compression stockings.  She denies any fever or chills.  Denies any bleeding issues.  No confusion is reported.    ED Course:  BP (!) 110/58   Pulse 89   Temp 98.2 F (36.8 C) (Oral)   Resp (!) 29   SpO2 92%   Sodium 139, potassium 3.7, bicarb 33, glucose 141, BUN 47, creatinine 1.45, total bilirubin 1.2, BNP 86 8.2, troponin 0 0.11, lactic acid 1.9 White count 12.3, platelets 194, hemoglobin 10.6 Smear remarkable for leukocytes, mild left shift, Chest x-ray with cardiomegaly, pulmonary vascular congestion, COPD changes, right basilar atelectasis, possible left pleural effusion versus left lower lobe consolidation EKG today Atrial fibrillation no acute changes    Review of Systems:  As per HPI otherwise all other systems reviewed and are negative  Past Medical History:  Diagnosis Date  . Atrial fibrillation  ([place])    Status post pulmonary vein isolation 2009 at [facility]  . Atrial fibrillation ([place]) 12/18/2007   Annotation: refractory Qualifier: Diagnosis of  By: Doy Mince LPN, Megan    . CAD (coronary artery disease)    LHC 05/2007: pLAD 70-80%, pRCA 40%, EF 20-25%. LAD lesion treated medically.   . CHF (congestive heart failure) ([place])   . Chronic diastolic heart failure ([place])    Echo 06/2010: Moderate LVH, EF 55-65%, normal wall motion, mild MR, moderate to severe LAE, mild RAE.   Marland Kitchen Chronic obstructive pulmonary disease ([place])   . COPD (chronic obstructive pulmonary disease) ([place])   . Coronary artery disease 06/15/2012  . Crohn's disease ([place])   . Depression   . Diabetes mellitus ([place]) 06/22/2012  . Diabetes mellitus without complication ([place])   . Dysrhythmia    atrial fib  . GERD (gastroesophageal reflux disease)   . Gout   . Hyperlipidemia   . Hypothyroidism    treated  . Morbid obesity ([place])   . Morbid obesity with BMI of 40.0-44.9, adult ([place]) 06/22/2012  . Nonischemic cardiomyopathy ([place])    history of,  EF 20-25% at left heart cath in 06/2007; echo 2011 had normal LV function  . Obstructive sleep apnea    continuous positive airway pressure not using at present  . On home oxygen therapy    "2L at night" (10/03/2015)  . Osteoporosis   . Pacemaker    Medtronic  . Peripheral vascular disease (HCC) 15   rt arm clots  . Seasonal allergies   . Shortness  of breath dyspnea    exersion  . Tachy-brady syndrome ([place])    status post implant of a medtronic dual-chamber pacemaker in 2001.  explanted in 2010 -- [place] pacemaker in 2010.    Past Surgical History:  Procedure Laterality Date  . CARDIAC CATHETERIZATION  08/2000   noncritical disease mid RCA, EF preserved  . CARDIAC CATHETERIZATION  05/29/2007   noncritical disease, EF 20-25%  . CARDIAC CATHETERIZATION N/A 01/09/2016   Procedure: Right Heart Cath;  Surgeon: Jolaine Artist, MD;  Location: [facility];  Service: Cardiovascular;  Laterality: N/A;  . CARDIAC CATHETERIZATION N/A 07/15/2016   Procedure: Right Heart Cath;  Surgeon: Jolaine Artist, MD;  Location: [facility];  Service: Cardiovascular;  Laterality: N/A;  . CATARACT EXTRACTION W/ INTRAOCULAR LENS  IMPLANT, BILATERAL Bilateral 2014-2016  . EMBOLECTOMY Right 08/16/2014   Procedure: EMBOLECTOMY- RIGHT BRACHIAL ARTERY;  Surgeon: Serafina [place], MD;  Location: [facility];  Service: Vascular;  Laterality: Right;  . PACEMAKER INSERTION  2010   medtronic ADAPTA   . PARATHYROIDECTOMY Right 01/30/2015   Procedure: PARATHYROIDECTOMY;  Surgeon: Armandina Gemma, MD;  Location: [facility];  Service: General;  Laterality: Right;  . PARATHYROIDECTOMY Left 10/03/2015   Left inferior parathyroidectomy w/neck exploration  . PARATHYROIDECTOMY Left 10/03/2015   Procedure: LEFT PARATHYROID EXPLORATION AND  PARATHYROIDECTOMY;  Surgeon: Armandina Gemma, MD;  Location: [facility];  Service: General;  Laterality: Left;  . post ablation pseudoaneurysm     at A fib ablation  . pulmonary vein isolation  05/12/2008   RFCA atrial fibrillation  . TONSILLECTOMY      Social History Social History   Socioeconomic History  . Marital status: Divorced    Spouse name: Not on file  . Number of children: Not on file  . Years of education: Not on file  . Highest education level: Not on file  Occupational History  . Occupation: retired  Scientific laboratory technician  . Financial resource strain: Not on file  . Food insecurity:    Worry: Not on file    Inability: Not on file  . Transportation needs:    Medical: Not on file    Non-medical: Not on file  Tobacco Use  . Smoking status: Former Smoker    Packs/day: 3.00    Years: 32.00    Pack years: 96.00    Last attempt to quit: 09/30/1986    Years since quitting: 31.3  . Smokeless tobacco: Former Systems developer    Quit date: 08/15/1987  Substance and Sexual Activity  . Alcohol use: No    Alcohol/week: 0.0 oz  . Drug use: No  .  Sexual activity: Never  Lifestyle  . Physical activity:    Days per week: Not on file    Minutes per session: Not on file  . Stress: Not on file  Relationships  . Social connections:    Talks on phone: Not on file    Gets together: Not on file    Attends religious service: Not on file    Active member of club or organization: Not on file    Attends meetings of clubs or organizations: Not on file    Relationship status: Not on file  . Intimate partner violence:    Fear of current or ex partner: Not on file    Emotionally abused: Not on file    Physically abused: Not on file    Forced sexual activity: Not on file  Other Topics  Concern  . Not on file  Social History Narrative   ** Merged History Encounter **         No Known Allergies  Family History  Problem Relation Age of Onset  . Breast cancer Mother   . Heart disease Father   . Emphysema Father       Prior to Admission medications   Medication Sig Start Date End Date Taking? Authorizing Provider  acetaminophen (TYLENOL) 325 MG tablet Take 2 tablets (650 mg total) by mouth every 6 (six) hours as needed for mild pain, fever or headache. 01/08/18  Yes Velvet Bathe, MD  ADVAIR DISKUS 500-50 MCG/DOSE AEPB Inhale 1 puff into the lungs every 12 (twelve) hours. 05/15/16  Yes [provider]  albuterol (PROVENTIL HFA;VENTOLIN HFA) 108 (90 BASE) MCG/ACT inhaler Inhale 2 puffs into the lungs every 6 (six) hours as needed for wheezing or shortness of breath.   Yes [provider]  apixaban (ELIQUIS) 5 MG TABS tablet Take 1 tablet (5 mg total) by mouth 2 (two) times daily. 07/29/17  Yes Bensimhon, Shaune Pascal, MD  atorvastatin (LIPITOR) 80 MG tablet Take 80 mg by mouth at bedtime.     Yes [provider]  colchicine 0.6 MG tablet Take 0.6-1.2 mg by mouth daily as needed (for gout flares). Reported on 03/04/2016 08/09/15  Yes [provider]  folic acid (FOLVITE) 1 MG tablet Take 1 mg by mouth daily.  06/01/15  Yes [provider]  LANTUS 100 UNIT/ML injection Inject 0.05 mLs (5 Units total) into the skin daily before breakfast. Patient taking differently: Inject 10 Units into the skin daily before breakfast.  01/08/18  Yes Velvet Bathe, MD  levothyroxine (SYNTHROID, LEVOTHROID) 100 MCG tablet Take 100 mcg by mouth daily. 05/02/16  Yes [provider]  macitentan (OPSUMIT) 10 MG tablet Take 1 tablet (10 mg total) by mouth daily. 01/09/18  Yes Bensimhon, Shaune Pascal, MD  Multiple Vitamins-Minerals (MULTIVITAMIN WITH MINERALS) tablet Take 1 tablet by mouth daily.   Yes [provider]  Multiple Vitamins-Minerals (PRESERVISION AREDS 2) CAPS Take 1 capsule by mouth 2 (two) times daily.   Yes [provider]  nitroGLYCERIN (NITROSTAT) 0.4 MG SL tablet Place 1 tablet (0.4 mg total) under the tongue every 5 (five) minutes as needed for chest pain. 07/07/14 01/19/2018 Yes Deboraha Sprang, MD  potassium chloride SA (K-DUR,KLOR-CON) 20 MEQ tablet Take 20 mEq by mouth 2 (two) times daily.   Yes [provider]  ranolazine (RANEXA) 500 MG 12 hr tablet Take 500 mg by mouth 2 (two) times daily.   Yes [provider]  sildenafil (REVATIO) 20 MG tablet Take 2 tablets (40 mg total) by mouth 3 (three) times daily. 06/13/16  Yes Bensimhon, Shaune Pascal, MD  spironolactone (ALDACTONE) 25 MG tablet Take 0.5 tablets (12.5 mg total) by mouth daily. 11/21/17  Yes Bensimhon, Shaune Pascal, MD  sulfaSALAzine (AZULFIDINE) 500 MG tablet Take 1,000 mg by mouth 2 (two) times daily. 04/12/16  Yes [provider]  Tiotropium Bromide Monohydrate (SPIRIVA RESPIMAT) 2.5 MCG/ACT AERS INHALE 2 PUFFS INTO THE LUNGS DAILY. 07/26/16  Yes Brand Males, MD  torsemide (DEMADEX) 20 MG tablet Take 1 tablet (20 mg total) by mouth 2 (two) times daily. 01/08/18  Yes Velvet Bathe, MD  metoprolol tartrate (LOPRESSOR) 25 MG tablet Take 1 tablet (25 mg total) by mouth 2 (two) times daily. 01/08/18   Velvet Bathe, MD  predniSONE (DELTASONE) 20 MG tablet Take 2 tablets (40  mg total) by mouth daily with breakfast. 01/09/18   Velvet Bathe, MD  allopurinol (ZYLOPRIM) 100 MG tablet Take 100 mg by mouth daily.    12/04/11  [provider]    Physical Exam:  Vitals:   01/06/2018 1045 01/03/2018 1115 12/29/2017 1215 01/06/2018 1245  BP: (!) 99/55 (!) 92/57 90/64 (!) 110/58  Pulse: 94 86 83 89  Resp: 18 (!) 25 (!) 25 (!) 29  Temp:      TempSrc:      SpO2: 95% 94% 92% 92%   Constitutional: NAD, calm, chronically ill appearing Eyes: PERRL, lids and conjunctivae normal ENMT: Mucous membranes are moist, without exudate or lesions  Neck: normal, supple, no masses, no thyromegaly Respiratory: Mild expiratory wheezing, trace crackles at the bases. Normal respiratory effort  Cardiovascular: Irregularly irregular rate and rhythm,  murmur, rubs or gallops.  Bilateral 1+ lower extremity edema. 2+ pedal pulses. No carotid bruits.  Abdomen: Soft, obese non tender, No hepatosplenomegaly. Bowel sounds positive.  Musculoskeletal: no clubbing / cyanosis. Moves all extremities Skin: no jaundice, No lesions.  Patient is wearing compression stockings in both lower extremities Neurologic: Sensation intact  Strength equal in all extremities Psychiatric:   Alert and oriented x 3.  Flat affect    Labs on Admission: I have personally reviewed following labs and imaging studies  CBC: Recent Labs  Lab 01/05/18 2251 01/06/18 0601 01/07/18 0428 01/08/18 0442 12/29/2017 0936  WBC 10.3 10.5 9.0 12.4* 12.3*  NEUTROABS 8.6*  --   --  10.1* 10.3*  HGB 9.8* 9.2* 9.5* 9.6* 10.6*  HCT 32.7* 31.3* 32.3* 32.7* 36.4  MCV 89.3 88.7 89.0 89.8 90.8  PLT 376 382 393 366 494*    Basic Metabolic Panel: Recent Labs  Lab 01/05/18 0440 01/06/18 0601 01/07/18 0428 01/08/18 0442 01/23/2018 0936  NA 129* 129* 130* 133* 139  K 4.0 3.7 3.1* 3.4* 3.7  CL 81* 80* 81* 86* 92*  CO2 34* 35* 36* 36* 33*  GLUCOSE 149* 101* 92 97  141*  BUN 55* 60* 48* 37* 47*  CREATININE 1.49* 1.65* 1.25* 1.09* 1.45*  CALCIUM 7.9* 7.9* 8.2* 8.0* 8.0*    GFR: Estimated Creatinine Clearance: 39.3 mL/min (A) (by C-G formula based on SCr of 1.45 mg/dL (H)).  Liver Function Tests: Recent Labs  Lab 01/16/2018 0936  AST 88*  ALT 53  ALKPHOS 206*  BILITOT 1.2  PROT 7.1  ALBUMIN 2.9*   No results for input(s): LIPASE, AMYLASE in the last 168 hours. No results for input(s): AMMONIA in the last 168 hours.  Coagulation Profile: No results for input(s): INR, PROTIME in the last 168 hours.  Cardiac Enzymes: No results for input(s): CKTOTAL, CKMB, CKMBINDEX, TROPONINI in the last 168 hours.  BNP (last 3 results) No results for input(s): PROBNP in the last 8760 hours.  HbA1C: No results for input(s): HGBA1C in the last 72 hours.  CBG: Recent Labs  Lab 01/07/18 1637 01/07/18 2023 01/08/18 0739 01/08/18 1146 01/08/18 1610  GLUCAP 170* 161* 98 122* 153*    Lipid Profile: No results for input(s): CHOL, HDL, LDLCALC, TRIG, CHOLHDL, LDLDIRECT in the last 72 hours.  Thyroid Function Tests: No results for input(s): TSH, T4TOTAL, FREET4, T3FREE, THYROIDAB in the last 72 hours.  Anemia Panel: No results for input(s): VITAMINB12, FOLATE, FERRITIN, TIBC, IRON, RETICCTPCT in the last 72 hours.  Urine analysis:    Component Value Date/Time   COLORURINE AMBER (A) 01/03/2018 1220   APPEARANCEUR CLEAR 01/03/2018 1220   LABSPEC  1.012 01/03/2018 1220   PHURINE 5.0 01/03/2018 1220   GLUCOSEU NEGATIVE 01/03/2018 1220   GLUCOSEU NEGATIVE 12/08/2007 1621   HGBUR SMALL (A) 01/03/2018 1220   BILIRUBINUR NEGATIVE 01/03/2018 1220   KETONESUR NEGATIVE 01/03/2018 1220   PROTEINUR 100 (A) 01/03/2018 1220   UROBILINOGEN 1.0 03/28/2015 1819   NITRITE NEGATIVE 01/03/2018 1220   LEUKOCYTESUR NEGATIVE 01/03/2018 1220    Sepsis Labs: @LABRCNTIP (procalcitonin:4,lacticidven:4) ) Recent Results (from the past 240 hour(s))  Culture, blood  (routine x 2)     Status: None   Collection Time: 01/03/18 10:00 AM  Result Value Ref Range Status   Specimen Description BLOOD LEFT WRIST  Final   Special Requests   Final    BOTTLES DRAWN AEROBIC ONLY Blood Culture adequate volume   Culture   Final    NO GROWTH 5 DAYS Performed at [facility], [place] [address], [place], [place] [address]    Report Status 01/08/2018 FINAL  Final  Culture, blood (routine x 2)     Status: None   Collection Time: 01/03/18 10:10 AM  Result Value Ref Range Status   Specimen Description BLOOD LEFT HAND  Final   Special Requests   Final    BOTTLES DRAWN AEROBIC ONLY Blood Culture adequate volume   Culture   Final    NO GROWTH 5 DAYS Performed at [facility], [place] [address] [place], [place] [address]    Report Status 01/08/2018 FINAL  Final  Respiratory Panel by PCR     Status: Abnormal   Collection Time: 01/03/18  6:44 PM  Result Value Ref Range Status   Adenovirus NOT DETECTED NOT DETECTED Final   Coronavirus 229E NOT DETECTED NOT DETECTED Final   Coronavirus HKU1 NOT DETECTED NOT DETECTED Final   Coronavirus NL63 NOT DETECTED NOT DETECTED Final   Coronavirus OC43 NOT DETECTED NOT DETECTED Final   Metapneumovirus NOT DETECTED NOT DETECTED Final   Rhinovirus / Enterovirus DETECTED (A) NOT DETECTED Final   Influenza A NOT DETECTED NOT DETECTED Final   Influenza B NOT DETECTED NOT DETECTED Final   Parainfluenza Virus 1 NOT DETECTED NOT DETECTED Final   Parainfluenza Virus 2 NOT DETECTED NOT DETECTED Final   Parainfluenza Virus 3 NOT DETECTED NOT DETECTED Final   Parainfluenza Virus 4 NOT DETECTED NOT DETECTED Final   Respiratory Syncytial Virus NOT DETECTED NOT DETECTED Final   Bordetella pertussis NOT DETECTED NOT DETECTED Final   Chlamydophila pneumoniae NOT DETECTED NOT DETECTED Final   Mycoplasma pneumoniae NOT DETECTED NOT DETECTED Final    Comment: Performed at [facility], [place] [address], [place], [place] [address]    Culture, Urine     Status: Abnormal   Collection Time: 01/04/18 12:00 AM  Result Value Ref Range Status   Specimen Description URINE, RANDOM  Final   Special Requests   Final    NONE Performed at [facility], [place] [address] [place], [place] [address]    Culture MULTIPLE SPECIES PRESENT, SUGGEST RECOLLECTION (A)  Final   Report Status 01/05/2018 FINAL  Final     Radiological Exams on Admission: Dg Chest Port 1 View  Result Date: 01/10/2018 CLINICAL DATA:  Increasing shortness of breath and lower extremity edema/swelling for 3 days after rehab, history CHF, coronary artery disease, non ischemic cardiomyopathy, diabetes mellitus, and COPD, Crohn's disease, atrial fibrillation EXAM: PORTABLE CHEST 1 VIEW COMPARISON:  Portable exam 1025 hours compared to 01/03/2018 FINDINGS: LEFT subclavian transvenous pacemaker with leads projecting  over RIGHT atrium and RIGHT ventricle unchanged. Enlargement of cardiac silhouette with pulmonary vascular congestion. Mediastinal contours normal. Emphysematous and bronchitic changes compatible with COPD. Atelectasis versus consolidation in LEFT lower lobe. Mild RIGHT basilar atelectasis. Upper lungs clear. Question small LEFT pleural effusion. No pneumothorax. Bones demineralized. IMPRESSION: Enlargement of cardiac silhouette pulmonary vascular congestion. COPD changes with mild RIGHT basilar atelectasis. Atelectasis versus consolidation LEFT lower lobe with question small LEFT pleural effusion. Electronically Signed   By: Lavonia Dana M.D.   On: 01/02/2018 10:50    EKG: Independently reviewed.  Assessment/Plan Principal Problem:   Acute on chronic right-sided congestive heart failure (HCC) Active Problems:   Anxiety state   COPD with emphysema ([place])   CARDIAC PACEMAKER IN SITU-MEDTRONIC ADAPTA L   Long term current use of anticoagulant therapy   Coronary artery disease   OSA (obstructive sleep apnea)   CKD (chronic kidney disease) stage 3, GFR 30-59  ml/min (HCC)   Crohn's disease of large intestine without complication (HCC)   Gastro-esophageal reflux disease without esophagitis   Chronic gout   Hypothyroidism   Type 2 diabetes mellitus with diabetic nephropathy ([place])   Pulmonary hypertension ([place]), RHC on 01/09/2016   Chronic atrial fibrillation (HCC)   Generalized weakness   Left lower lobe consolidation (HCC)   Acute on chronic combined systolic and diastolic heart failure, in the setting of recent admission for same, but likely to be due to noncompliance with her CHF meds O sats were in the 80s on room air, now in the 90s on 2 L of oxygen..  Last echo in 12-2017, showing EF 55-60%, mild MR, severe biatrial LA enlargement, RV moderately dilated, moderate TR,.  Of note, the patient's BNP is above 800, troponin is 0.1, and EKG is in atrial fibrillation, without acute changes. Admit to  telemetry inpatient At this time, no diuresis can be provided, asked the patient to blood pressure medications: Diuretics at home, and currently, her blood pressure is soft, less than 100.  We will resume diuresis, once her blood pressure improves.  Including torsemide, and spironolactone. Strict IO's Weight daily EKG and serial troponins If symptoms do not improve, or worsen, will obtain cardiology evaluation while in the hospital.     History of TAH, V failure, mixed group WHO1 and 3, on macittentan Currently macitentan on hold due to low blood pressure    Hypertension BP (!) 110/58   Pulse 89   Temp 98.2 F (36.8 C) (Oral)   Resp (!) 29   SpO2 92%   Hold  home anti-hypertensive medications , will need diuresis, last meds taken this am    Hyperlipidemia Continue home statins  History of COPD, and recent hospitalization for acute bronchitis due to rhinovirus, chest x-ray shows possible consolidation, no leukocytosis noted.  Patient did not take her low steroid taper.  O2 sats are in the 90s on 2 L of oxygen.  Lactic acid 1.9, white count is  12.3 Will start low steroid dose while in the hospital Continue nebulizer treatment with albuterol, and duo nebs as needed. We will start azithromycin in view of recent infection, and possible consolidation in left lung as mentioned above Sputum cultures, blood cx pending, urine cultures   OSA Continue CPAP nightly   History of paroxysmal atrial fibrillation, she denies any chest pain or palpitations.  EKG does not show any acute findings Continue Eliquis, will hold metoprolol for now, due to low blood pressure, may resume with these normalizes.    Coronary  artery disease, continue statins and Eliquis  CKD, stage III, current creatinine is 1.45 We will continue to monitor closely, as when stabilized from the blood pressure standpoint, the patient will need diuresis. Repeat BMET in am    Diabetes, latest A1c is 5.8, the patient is on Lantus Continue with Lantus, and sliding scale insulin Diabetic diet  Gout: The patient is on as needed colchicine, no acute changes.  Cultures and is on hold at this time   Anemia, of chronic disease, hemoglobin is 10.6, but her hematocrit is 36.4, appears to have good iron stores.  No acute bleeding issues. Check CBC in a.m.   Crohn's disease Continue sulfasalazine  History of cardiac pacemaker in situ, Medtronic, for tachybradycardia syndrome, No acute issues, the patient can follow-up with the heart failure team  Hypothyroidism: Continue home Synthroid    GERD, no acute symptoms Continue PPI  DVT prophylaxis:  Eliquis  Code Status:    FUll  Family Communication:  Discussed with patient Disposition Plan: Expect patient to be discharged to home after condition improves Consults called:   None   Admission status:  Tele IP    Sharene Butters, PA-C [facility]   Amion text  [id number]   01/26/2018, 12:55 PM

## 2018-01-11 NOTE — ED Notes (Signed)
Pt stated she is on 2.5L of [place] O2. While waiting to go back to triage placed pt on 3L [place].

## 2018-01-11 NOTE — ED Provider Notes (Signed)
[name] EMERGENCY DEPARTMENT Provider Note   CSN: [id number] Arrival date & time: 01/10/2018  0913     History   Chief Complaint Chief Complaint  Patient presents with  . low sats/hypotension    HPI [name] is a 77 y.o. female.  HPI Patient presents to the emergency department with increasing shortness of breath and lower extremity edema over the last 2 days.  The patient states she checked herself out of a nursing home 2 days ago.  She states that she did not like the facility and did not take any of her medications with her.  The patient states that she feels more weak than normal but is unsure if this is deconditioning or a new issue.  The patient states that nothing seems to make her condition better or worse.  The patient denies chest pain,  headache,blurred vision, neck pain, fever, cough,  numbness, dizziness, anorexia,  abdominal pain, nausea, vomiting, diarrhea, rash, back pain, dysuria, hematemesis, bloody stool, near syncope, or syncope. Past Medical History:  Diagnosis Date  . Atrial fibrillation ([place])    Status post pulmonary vein isolation 2009 at [facility]  . Atrial fibrillation ([place]) 12/18/2007   Annotation: refractory Qualifier: Diagnosis of  By: Doy Mince LPN, Megan    . CAD (coronary artery disease)    LHC 05/2007: pLAD 70-80%, pRCA 40%, EF 20-25%. LAD lesion treated medically.   . CHF (congestive heart failure) ([place])   . Chronic diastolic heart failure ([place])    Echo 06/2010: Moderate LVH, EF 55-65%, normal wall motion, mild MR, moderate to severe LAE, mild RAE.   Marland Kitchen Chronic obstructive pulmonary disease ([place])   . COPD (chronic obstructive pulmonary disease) ([place])   . Coronary artery disease 06/15/2012  . Crohn's disease ([place])   . Depression   . Diabetes mellitus ([place]) 06/22/2012  . Diabetes mellitus without complication ([place])   . Dysrhythmia    atrial fib  . GERD (gastroesophageal reflux disease)   . Gout   . Hyperlipidemia   .  Hypothyroidism    treated  . Morbid obesity ([place])   . Morbid obesity with BMI of 40.0-44.9, adult ([place]) 06/22/2012  . Nonischemic cardiomyopathy ([place])    history of,  EF 20-25% at left heart cath in 06/2007; echo 2011 had normal LV function  . Obstructive sleep apnea    continuous positive airway pressure not using at present  . On home oxygen therapy    "2L at night" (10/03/2015)  . Osteoporosis   . Pacemaker    Medtronic  . Peripheral vascular disease (HCC) 15   rt arm clots  . Seasonal allergies   . Shortness of breath dyspnea    exersion  . Tachy-brady syndrome ([place])    status post implant of a medtronic dual-chamber pacemaker in 2001.  explanted in 2010 -- [place] pacemaker in 2010.    Patient Active Problem List   Diagnosis Date Noted  . Dyslipidemia associated with type 2 diabetes mellitus ([place]) 01/10/2018  . Acute bronchitis due to Rhinovirus 01/05/2018  . Leukocytosis   . PICC (peripherally inserted central catheter) in place   . Respiratory crackles at left lung base   . Cough   . Frontal headache   . Generalized weakness 12/29/2017  . Acute on chronic right-sided congestive heart failure ([place]) 12/28/2017  . Anemia 12/25/2017  . Hypokalemia   . Chronic atrial fibrillation ([place])   . Pulmonary hypertension ([place]), RHC on 01/09/2016 01/09/2016  .  Hypomagnesemia 10/13/2015  . Fever 03/28/2015  . CKD (chronic kidney disease) stage 3, GFR 30-59 ml/min (HCC) 03/13/2015  . Crohn's disease of large intestine without complication ([place]) 62/37/6283  . Gastro-esophageal reflux disease without esophagitis 03/13/2015  . Chronic gout 03/13/2015  . Hypertensive heart disease with CHF (congestive heart failure) ([place]) 03/13/2015  . Hypothyroidism 03/13/2015  . Extreme obesity 03/13/2015  . Osteopenia 03/13/2015  . Primary hyperparathyroidism ([place]) 03/13/2015  . Type 2 diabetes mellitus with diabetic nephropathy ([place]) 03/13/2015  . OSA (obstructive sleep  apnea) 02/18/2015  . Morbid obesity with BMI of 40.0-44.9, adult ([place]) 06/22/2012  . Coronary artery disease 06/15/2012  . Long term current use of anticoagulant therapy 01/11/2011  . CARDIAC PACEMAKER IN SITU-MEDTRONIC ADAPTA L 01/15/2010  . OTHER OSTEOPOROSIS 09/05/2008  . COPD with emphysema ([place]) 01/02/2008  . ANXIETY 12/18/2007    Past Surgical History:  Procedure Laterality Date  . CARDIAC CATHETERIZATION  08/2000   noncritical disease mid RCA, EF preserved  . CARDIAC CATHETERIZATION  05/29/2007   noncritical disease, EF 20-25%  . CARDIAC CATHETERIZATION N/A 01/09/2016   Procedure: Right Heart Cath;  Surgeon: Jolaine Artist, MD;  Location: [facility];  Service: Cardiovascular;  Laterality: N/A;  . CARDIAC CATHETERIZATION N/A 07/15/2016   Procedure: Right Heart Cath;  Surgeon: Jolaine Artist, MD;  Location: [facility];  Service: Cardiovascular;  Laterality: N/A;  . CATARACT EXTRACTION W/ INTRAOCULAR LENS  IMPLANT, BILATERAL Bilateral 2014-2016  . EMBOLECTOMY Right 08/16/2014   Procedure: EMBOLECTOMY- RIGHT BRACHIAL ARTERY;  Surgeon: Serafina Mitchell, MD;  Location: [facility];  Service: Vascular;  Laterality: Right;  . PACEMAKER INSERTION  2010   medtronic ADAPTA   . PARATHYROIDECTOMY Right 01/30/2015   Procedure: PARATHYROIDECTOMY;  Surgeon: Armandina Gemma, MD;  Location: [facility];  Service: General;  Laterality: Right;  . PARATHYROIDECTOMY Left 10/03/2015   Left inferior parathyroidectomy w/neck exploration  . PARATHYROIDECTOMY Left 10/03/2015   Procedure: LEFT PARATHYROID EXPLORATION AND  PARATHYROIDECTOMY;  Surgeon: Armandina Gemma, MD;  Location: [facility];  Service: General;  Laterality: Left;  . post ablation pseudoaneurysm     at A fib ablation  . pulmonary vein isolation  05/12/2008   RFCA atrial fibrillation  . TONSILLECTOMY       OB History    Gravida  0   Para  0   Term  0   Preterm  0   AB  0   Living        SAB  0   TAB  0   Ectopic  0    Multiple      Live Births               Home Medications    Prior to Admission medications   Medication Sig Start Date End Date Taking? Authorizing Provider  acetaminophen (TYLENOL) 325 MG tablet Take 2 tablets (650 mg total) by mouth every 6 (six) hours as needed for mild pain, fever or headache. 01/08/18  Yes Velvet Bathe, MD  ADVAIR DISKUS 500-50 MCG/DOSE AEPB Inhale 1 puff into the lungs every 12 (twelve) hours. 05/15/16  Yes [provider]  albuterol (PROVENTIL HFA;VENTOLIN HFA) 108 (90 BASE) MCG/ACT inhaler Inhale 2 puffs into the lungs every 6 (six) hours as needed for wheezing or shortness of breath.   Yes [provider]  apixaban (ELIQUIS) 5 MG TABS tablet Take 1 tablet (5 mg total) by mouth 2 (two) times daily. 07/29/17  Yes Bensimhon, Shaune Pascal,  MD  atorvastatin (LIPITOR) 80 MG tablet Take 80 mg by mouth at bedtime.     Yes [provider]  colchicine 0.6 MG tablet Take 0.6-1.2 mg by mouth daily as needed (for gout flares). Reported on 03/04/2016 08/09/15  Yes [provider]  folic acid (FOLVITE) 1 MG tablet Take 1 mg by mouth daily. 06/01/15  Yes [provider]  LANTUS 100 UNIT/ML injection Inject 0.05 mLs (5 Units total) into the skin daily before breakfast. Patient taking differently: Inject 10 Units into the skin daily before breakfast.  01/08/18  Yes Velvet Bathe, MD  levothyroxine (SYNTHROID, LEVOTHROID) 100 MCG tablet Take 100 mcg by mouth daily. 05/02/16  Yes [provider]  macitentan (OPSUMIT) 10 MG tablet Take 1 tablet (10 mg total) by mouth daily. 01/09/18  Yes Bensimhon, Shaune Pascal, MD  Multiple Vitamins-Minerals (MULTIVITAMIN WITH MINERALS) tablet Take 1 tablet by mouth daily.   Yes [provider]  Multiple Vitamins-Minerals (PRESERVISION AREDS 2) CAPS Take 1 capsule by mouth 2 (two) times daily.   Yes [provider]  nitroGLYCERIN (NITROSTAT) 0.4 MG SL tablet Place 1 tablet (0.4 mg total) under the  tongue every 5 (five) minutes as needed for chest pain. 07/07/14 01/15/2018 Yes Deboraha Sprang, MD  potassium chloride SA (K-DUR,KLOR-CON) 20 MEQ tablet Take 20 mEq by mouth 2 (two) times daily.   Yes [provider]  ranolazine (RANEXA) 500 MG 12 hr tablet Take 500 mg by mouth 2 (two) times daily.   Yes [provider]  sildenafil (REVATIO) 20 MG tablet Take 2 tablets (40 mg total) by mouth 3 (three) times daily. 06/13/16  Yes Bensimhon, Shaune Pascal, MD  spironolactone (ALDACTONE) 25 MG tablet Take 0.5 tablets (12.5 mg total) by mouth daily. 11/21/17  Yes Bensimhon, Shaune Pascal, MD  sulfaSALAzine (AZULFIDINE) 500 MG tablet Take 1,000 mg by mouth 2 (two) times daily. 04/12/16  Yes [provider]  Tiotropium Bromide Monohydrate (SPIRIVA RESPIMAT) 2.5 MCG/ACT AERS INHALE 2 PUFFS INTO THE LUNGS DAILY. 07/26/16  Yes Brand Males, MD  torsemide (DEMADEX) 20 MG tablet Take 1 tablet (20 mg total) by mouth 2 (two) times daily. 01/08/18  Yes Velvet Bathe, MD  metoprolol tartrate (LOPRESSOR) 25 MG tablet Take 1 tablet (25 mg total) by mouth 2 (two) times daily. 01/08/18   Velvet Bathe, MD  predniSONE (DELTASONE) 20 MG tablet Take 2 tablets (40 mg total) by mouth daily with breakfast. 01/09/18   Velvet Bathe, MD  allopurinol (ZYLOPRIM) 100 MG tablet Take 100 mg by mouth daily.    12/04/11  [provider]    Family History Family History  Problem Relation Age of Onset  . Breast cancer Mother   . Heart disease Father   . Emphysema Father     Social History Social History   Tobacco Use  . Smoking status: Former Smoker    Packs/day: 3.00    Years: 32.00    Pack years: 96.00    Last attempt to quit: 09/30/1986    Years since quitting: 31.3  . Smokeless tobacco: Former Systems developer    Quit date: 08/15/1987  Substance Use Topics  . Alcohol use: No    Alcohol/week: 0.0 oz  . Drug use: No     Allergies   Patient has no known allergies.   Review of Systems Review of  Systems All other systems negative except as documented in the HPI. All pertinent positives and negatives as reviewed in the HPI.  Physical Exam Updated  Vital Signs BP (!) 99/55   Pulse 94   Temp 98.2 F (36.8 C) (Oral)   Resp 18   SpO2 95%   Physical Exam  Constitutional: She is oriented to person, place, and time. She appears well-developed and well-nourished. No distress.  HENT:  Head: Normocephalic and atraumatic.  Mouth/Throat: Oropharynx is clear and moist.  Eyes: Pupils are equal, round, and reactive to light.  Neck: Normal range of motion. Neck supple.  Cardiovascular: Normal rate, regular rhythm and normal heart sounds. Exam reveals no gallop and no friction rub.  No murmur heard. Pulmonary/Chest: Effort normal. No respiratory distress. She has no wheezes. She has rales.  Abdominal: Soft. Bowel sounds are normal. She exhibits no distension. There is no tenderness.  Neurological: She is alert and oriented to person, place, and time. She exhibits normal muscle tone. Coordination normal.  Skin: Skin is warm and dry. Capillary refill takes less than 2 seconds. No rash noted. No erythema.  Psychiatric: She has a normal mood and affect. Her behavior is normal.  Nursing note and vitals reviewed.    ED Treatments / Results  Labs (all labs ordered are listed, but only abnormal results are displayed) Labs Reviewed  COMPREHENSIVE METABOLIC PANEL - Abnormal; Notable for the following components:      Result Value   Chloride 92 (*)    CO2 33 (*)    Glucose, Bld 141 (*)    BUN 47 (*)    Creatinine, Ser 1.45 (*)    Calcium 8.0 (*)    Albumin 2.9 (*)    AST 88 (*)    Alkaline Phosphatase 206 (*)    GFR calc non Af Amer 34 (*)    GFR calc Af Amer 39 (*)    All other components within normal limits  CBC WITH DIFFERENTIAL/PLATELET - Abnormal; Notable for the following components:   WBC 12.3 (*)    Hemoglobin 10.6 (*)    MCHC 29.1 (*)    RDW 26.2 (*)    Platelets 494 (*)     Neutro Abs 10.3 (*)    Monocytes Absolute 1.1 (*)    All other components within normal limits  BRAIN NATRIURETIC PEPTIDE - Abnormal; Notable for the following components:   B Natriuretic Peptide 868.2 (*)    All other components within normal limits  I-STAT TROPONIN, ED - Abnormal; Notable for the following components:   Troponin i, poc 0.11 (*)    All other components within normal limits  I-STAT CG4 LACTIC ACID, ED  I-STAT CG4 LACTIC ACID, ED    EKG EKG Interpretation  Date/Time:  Sunday January 11 2018 09:36:20 EDT Ventricular Rate:  95 PR Interval:    QRS Duration: 176 QT Interval:  448 QTC Calculation: 562 R Axis:   135 Text Interpretation:  Atrial fibrillation Non-specific intra-ventricular conduction block Right ventricular hypertrophy Lateral infarct , age undetermined T wave abnormality, consider inferior ischemia T wave abnormality, consider anterior ischemia Abnormal ECG No STEMI.  Confirmed by Nanda Quinton [phone]) on 01/22/2018 9:53:40 AM   Radiology Dg Chest Port 1 View  Result Date: 01/23/2018 CLINICAL DATA:  Increasing shortness of breath and lower extremity edema/swelling for 3 days after rehab, history CHF, coronary artery disease, non ischemic cardiomyopathy, diabetes mellitus, and COPD, Crohn's disease, atrial fibrillation EXAM: PORTABLE CHEST 1 VIEW COMPARISON:  Portable exam 1025 hours compared to 01/03/2018 FINDINGS: LEFT subclavian transvenous pacemaker with leads projecting over RIGHT atrium and RIGHT ventricle unchanged. Enlargement of cardiac silhouette with pulmonary  vascular congestion. Mediastinal contours normal. Emphysematous and bronchitic changes compatible with COPD. Atelectasis versus consolidation in LEFT lower lobe. Mild RIGHT basilar atelectasis. Upper lungs clear. Question small LEFT pleural effusion. No pneumothorax. Bones demineralized. IMPRESSION: Enlargement of cardiac silhouette pulmonary vascular congestion. COPD changes with mild RIGHT basilar  atelectasis. Atelectasis versus consolidation LEFT lower lobe with question small LEFT pleural effusion. Electronically Signed   By: Lavonia Dana M.D.   On: 01/17/2018 10:50    Procedures Procedures (including critical care time)  Medications Ordered in ED Medications - No data to display   Initial Impression / Assessment and Plan / ED Course  I have reviewed the triage vital signs and the nursing notes.  Pertinent labs & imaging results that were available during my care of the patient were reviewed by me and considered in my medical decision making (see chart for details).     Patient need admission to the hospital for further evaluation of her CHF.  Patient has not been taking her medicine since she left the nursing home [facility].  The patient has been stable otherwise here in the emergency department requiring supplemental oxygen.  I did not give her Lasix at this time due to the fact that her blood pressures were somewhat low.  I did convey this to the hospitalist and was seeking their input on how they wanted to proceed.  Final Clinical Impressions(s) / ED Diagnoses   Final diagnoses:  Shortness of breath    ED Discharge Orders    None       Dalia Heading, Hershal Coria 01/01/2018 1536    Long, Wonda Olds, MD 01/21/2018 1950    Margette Fast, MD 01/03/2018 [phone]

## 2018-01-12 ENCOUNTER — Encounter (HOSPITAL_COMMUNITY): Payer: Self-pay | Admitting: Internal Medicine

## 2018-01-12 LAB — BASIC METABOLIC PANEL
Anion gap: 18 — ABNORMAL HIGH (ref 5–15)
BUN: 49 mg/dL — AB (ref 6–20)
CALCIUM: 8 mg/dL — AB (ref 8.9–10.3)
CO2: 28 mmol/L (ref 22–32)
CREATININE: 1.62 mg/dL — AB (ref 0.44–1.00)
Chloride: 93 mmol/L — ABNORMAL LOW (ref 101–111)
GFR calc non Af Amer: 30 mL/min — ABNORMAL LOW (ref >60)
GFR, EST AFRICAN AMERICAN: 34 mL/min — AB (ref >60)
Glucose, Bld: 129 mg/dL — ABNORMAL HIGH (ref 65–99)
Potassium: 4.4 mmol/L (ref 3.5–5.1)
SODIUM: 139 mmol/L (ref 135–145)

## 2018-01-12 LAB — CBC
HCT: 38.3 % (ref 36.0–46.0)
Hemoglobin: 11 g/dL — ABNORMAL LOW (ref 12.0–15.0)
MCH: 27.2 pg (ref 26.0–34.0)
MCHC: 28.7 g/dL — ABNORMAL LOW (ref 30.0–36.0)
MCV: 94.6 fL (ref 78.0–100.0)
PLATELETS: 557 10*3/uL — AB (ref 150–400)
RBC: 4.05 MIL/uL (ref 3.87–5.11)
RDW: 26.5 % — AB (ref 11.5–15.5)
WBC: 18.1 10*3/uL — ABNORMAL HIGH (ref 4.0–10.5)

## 2018-01-12 LAB — GLUCOSE, CAPILLARY
GLUCOSE-CAPILLARY: 192 mg/dL — AB (ref 65–99)
Glucose-Capillary: 128 mg/dL — ABNORMAL HIGH (ref 65–99)
Glucose-Capillary: 108 mg/dL — ABNORMAL HIGH (ref 65–99)
Glucose-Capillary: 167 mg/dL — ABNORMAL HIGH (ref 65–99)

## 2018-01-12 LAB — TROPONIN I: Troponin I: 0.08 ng/mL

## 2018-01-12 LAB — PATHOLOGIST SMEAR REVIEW

## 2018-01-12 MED ORDER — SILDENAFIL CITRATE 20 MG PO TABS
20.0000 mg | ORAL_TABLET | Freq: Three times a day (TID) | ORAL | Status: DC
  Administered 2018-01-12 (×2): 20 mg via ORAL
  Filled 2018-01-12 (×3): qty 1

## 2018-01-12 MED ORDER — FUROSEMIDE 10 MG/ML IJ SOLN
80.0000 mg | Freq: Two times a day (BID) | INTRAMUSCULAR | Status: DC
  Administered 2018-01-12 (×2): 80 mg via INTRAVENOUS
  Filled 2018-01-12 (×2): qty 8

## 2018-01-12 MED ORDER — MOMETASONE FURO-FORMOTEROL FUM 200-5 MCG/ACT IN AERO
2.0000 | INHALATION_SPRAY | Freq: Two times a day (BID) | RESPIRATORY_TRACT | Status: DC
  Administered 2018-01-12: 2 via RESPIRATORY_TRACT
  Filled 2018-01-12: qty 8.8

## 2018-01-12 MED ORDER — LORAZEPAM 0.5 MG PO TABS
0.5000 mg | ORAL_TABLET | Freq: Once | ORAL | Status: AC
  Administered 2018-01-12: 0.5 mg via ORAL
  Filled 2018-01-12: qty 1

## 2018-01-12 MED ORDER — MACITENTAN 10 MG PO TABS
10.0000 mg | ORAL_TABLET | Freq: Every day | ORAL | Status: DC
  Administered 2018-01-12: 10 mg via ORAL
  Filled 2018-01-12 (×2): qty 1

## 2018-01-12 NOTE — Clinical Social Work Placement (Signed)
   CLINICAL SOCIAL WORK PLACEMENT  NOTE  Date:  01/12/2018  Patient Details  Name: [name] MRN: [id number] Date of Birth: [birth year]  Clinical Social Work is seeking post-discharge placement for this patient at the [place] level of care (*CSW will initial, date and re-position this form in  chart as items are completed):  Yes   Patient/family provided with [facility] Work Department's list of facilities offering this level of care within the geographic area requested by the patient (or if unable, by the patient's family).  Yes   Patient/family informed of their freedom to choose among providers that offer the needed level of care, that participate in Medicare, Medicaid or managed care program needed by the patient, have an available bed and are willing to accept the patient.  Yes   Patient/family informed of [facility]'s ownership interest in [facility] and [facility], as well as of the fact that they are under no obligation to receive care at these facilities.  PASRR submitted to EDS on 01/12/18     PASRR number received on       Existing PASRR number confirmed on 01/12/18     FL2 transmitted to all facilities in geographic area requested by pt/family on 01/12/18     FL2 transmitted to all facilities within larger geographic area on       Patient informed that his/her managed care company has contracts with or will negotiate with certain facilities, including the following:            Patient/family informed of bed offers received.  Patient chooses bed at       Physician recommends and patient chooses bed at      Patient to be transferred to   on  .  Patient to be transferred to facility by       Patient family notified on   of transfer.  Name of family member notified:        PHYSICIAN Please sign FL2     Additional Comment:    _______________________________________________ Candie Chroman, LCSW 01/12/2018, 1:35  PM

## 2018-01-12 NOTE — Clinical Social Work Note (Signed)
Clinical Social Work Assessment  Patient Details  Name: [name] MRN: [id number] Date of Birth: 1941-04-30  Date of referral:  01/12/18               Reason for consult:  Facility Placement, Discharge Planning                Permission sought to share information with:  Chartered certified accountant granted to share information::  Yes, Verbal Permission Granted  Name::        Agency::  SNF's  Relationship::     Contact Information:     Housing/Transportation Living arrangements for the past 2 months:  [place] of Information:  Patient, Medical Team, Facility Patient Interpreter Needed:  None Criminal Activity/Legal Involvement Pertinent to Current Situation/Hospitalization:  No - Comment as needed Significant Relationships:  Adult Children, Friend, Other Family Members Lives with:  Self Do you feel safe going back to the place where you live?  Yes Need for family participation in patient care:  Yes (Comment)  Care giving concerns:  PT recommending SNF once medically stable for discharge.   Social Worker assessment / plan:  CSW met with patient. No supports at bedside. CSW introduced role and explained that PT recommendations would be discussed. Patient confirmed she was discharged to [facility] last week and left AMA the next day. [facility] was full when she was ready for discharge so she had to go to this facility instead. [facility] is still first preference. Referral sent and CSW left voicemail for admissions coordinator to notify. [facility] requires family/supports complete admissions paperwork so CSW encouraged patient to go ahead and figure out who will do that for her. No further concerns. CSW encouraged patient to contact CSW as needed. CSW will continue to follow patient for support and facilitate discharge to SNF once medically stable.  Employment status:  Retired Nurse, adult PT Recommendations:   [place] / Referral to community resources:  [place]  Patient/Family's Response to care:  Patient agreeable to SNF placement. First preference is [facility]. Patient's family supportive and involved in patient's care. Patient appreciated social work intervention.  Patient/Family's Understanding of and Emotional Response to Diagnosis, Current Treatment, and Prognosis:  Patient has a good understanding of the reason for admission and her need for rehab prior to returning home. Patient appears pleased with hospital care.  Emotional Assessment Appearance:  Appears stated age Attitude/Demeanor/Rapport:  Engaged Affect (typically observed):  Accepting, Flat Orientation:  Oriented to Self, Oriented to Place, Oriented to  Time, Oriented to Situation Alcohol / Substance use:  Never Used Psych involvement (Current and /or in the community):  No (Comment)  Discharge Needs  Concerns to be addressed:  Care Coordination Readmission within the last 30 days:  Yes Current discharge risk:  Dependent with Mobility, Lives alone Barriers to Discharge:  Continued Medical Work up, [place], LCSW 01/12/2018, 1:32 PM

## 2018-01-12 NOTE — Discharge Instructions (Signed)

## 2018-01-12 NOTE — Evaluation (Signed)
Physical Therapy Evaluation Patient Details Name: [name] MRN: [id number] DOB: March 18, 1941 Today's Date: 01/12/2018   History of Present Illness  [name] is a 77 y.o. female with medical history significant for diabetes, hypertension, Crohn's disease, depression, paroxysmal atrial fibrillation, OSA, COPD not recently using oxygen, CHF with EF 55%, chronic anemia, recent hospitalization from 12/25/2017 through 01/08/2018 for CHF exacerbation and acute bronchitis, discharged on prednisone,, discharged to rehab facility, leaving one day later because "could not stand it there ", since then, the patient over the last 3 days, has not been compliant with her meds, except today, when she decided to resume them.  However, she reported increasing cough, with frothy sputum, increasing shortness of breath, and was noted to have O2 sat in the 80s, requiring 3 L on arrival.      Clinical Impression  Pt very deconditioned. Pt HR increased into 120s with sit to std, std pvt to [facility]. SpO2 at 74% on 4Lo2 via [place] however unsure of accuracy due to taking over 5 min to get a ready. RN notified. Pt unsafe to return home alone. Pt was a [facility] for 1 day s/p recent admit however pt states "its the worst place ever." Pt states "I like [facility]." Will speak to case management. Acute PT to con't to follow.    Follow Up Recommendations SNF;Supervision/Assistance - 24 hour    Equipment Recommendations  None recommended by PT    Recommendations for Other Services       Precautions / Restrictions Precautions Precautions: Fall Restrictions Weight Bearing Restrictions: No      Mobility  Bed Mobility               General bed mobility comments: pt sitting up in chair upon PT arrival  Transfers Overall transfer level: Needs assistance Equipment used: 4-wheeled walker Transfers: Sit to/from [employer] Transfers Sit to Stand: Min assist Stand pivot transfers: Min assist        General transfer comment: v/c's to push up from arm rest of chair, minA to stabilize pt during transition of hands. Pt with increased ease from higher surface. blankets placed in chair to elevate surface  Ambulation/Gait             General Gait Details: pt unable to amb this date due to HR in 120s with std pvt to BSC and SpO2 at 72% on 4LO2 via [place]  Stairs            Wheelchair Mobility    Modified Rankin (Stroke Patients Only)       Balance Overall balance assessment: Needs assistance(NT)   Sitting balance-Leahy Scale: Good     Standing balance support: Bilateral upper extremity supported Standing balance-Leahy Scale: Poor Standing balance comment: dependent on rollator, unable to maintain full upright position due to SOB                             Pertinent Vitals/Pain Pain Assessment: No/denies pain    Home Living Family/patient expects to be discharged to:: Unsure Living Arrangements: Alone Available Help at Discharge: Family;Available PRN/intermittently Type of Home: House Home Access: Stairs to enter Entrance Stairs-Rails: None Entrance Stairs-Number of Steps: threshold Home Layout: One level Home Equipment: Walker - 2 wheels;Walker - 4 wheels;Shower seat;Bedside commode Additional Comments: pt was recently admitted to hospital and d/cd to SNF but tleft after a day because 'i couldn't stand it"    Prior Function Level  of Independence: Independent with assistive device(s)         Comments: Mod indep with RW. Drives     Hand Dominance   Dominant Hand: Right    Extremity/Trunk Assessment   Upper Extremity Assessment Upper Extremity Assessment: Generalized weakness    Lower Extremity Assessment Lower Extremity Assessment: Generalized weakness RLE Deficits / Details: able to bring ankle to knee to don socks LLE Deficits / Details: able to bring ankle to knees to don socks    Cervical / Trunk Assessment Cervical / Trunk  Assessment: (scoliosis)  Communication   Communication: HOH  Cognition Arousal/Alertness: Awake/alert Behavior During Therapy: Flat affect Overall Cognitive Status: Impaired/Different from baseline Area of Impairment: Safety/judgement;Awareness                         Safety/Judgement: Decreased awareness of safety(stopped taking her medications) Awareness: Emergent          General Comments General comments (skin integrity, edema, etc.): bilat LE edema. pt with loose stool upon standing. assist to [facility], dependent for hygiene    Exercises     Assessment/Plan    PT Assessment Patient needs continued PT services  PT Problem List Decreased strength;Decreased activity tolerance;Decreased mobility;Decreased balance;Decreased knowledge of use of DME;Decreased safety awareness;Decreased cognition       PT Treatment Interventions DME instruction;Gait training;Stair training;Functional mobility training;Therapeutic activities;Therapeutic exercise;Balance training;Patient/family education    PT Goals (Current goals can be found in the Care Plan section)  Acute Rehab PT Goals Patient Stated Goal: get stronger PT Goal Formulation: With patient Time For Goal Achievement: 01/25/18 Potential to Achieve Goals: Fair    Frequency Min 3X/week   Barriers to discharge Decreased caregiver support lives alone    Co-evaluation               AM-PAC PT "6 Clicks" Daily Activity  Outcome Measure Difficulty turning over in bed (including adjusting bedclothes, sheets and blankets)?: Unable Difficulty moving from lying on back to sitting on the side of the bed? : Unable Difficulty sitting down on and standing up from a chair with arms (e.g., wheelchair, bedside commode, etc,.)?: Unable Help needed moving to and from a bed to chair (including a wheelchair)?: A Lot Help needed walking in hospital room?: A Lot Help needed climbing 3-5 steps with a railing? : Total 6 Click Score:  8    End of Session Equipment Utilized During Treatment: Gait belt Activity Tolerance: Patient limited by fatigue Patient left: in chair;with call bell/phone within reach;with chair alarm set Nurse Communication: Mobility status(Hr in 120s with std pvt to commode, SpO2 at 72% on 4Lo2 via ) PT Visit Diagnosis: Other abnormalities of gait and mobility (R26.89);Difficulty in walking, not elsewhere classified (R26.2)    Time: 8841-6606 PT Time Calculation (min) (ACUTE ONLY): 41 min   Charges:   PT Evaluation $PT Eval Moderate Complexity: 1 Mod PT Treatments $Therapeutic Activity: 23-37 mins   PT G Codes:        Kittie Plater, PT, DPT Pager #: [phone] Office #: [phone]   Analyse Angst M Carma Dwiggins 01/12/2018, 9:07 AM

## 2018-01-12 NOTE — Progress Notes (Addendum)
Advanced Heart Failure Team Consult Note   Primary Physician: [name], MD PCP-Cardiologist: Dr Haroldine Laws  Reason for Consultation: Heart Failure   HPI:    [name] is seen today for evaluation of heart failure at the request of Dr Verlon Au.   [name] is a 77 year old with a history of combined systolic/disastolic hf, HTN, DM, hyperlipidemia, hypothyroidism, tachy/brady s/p PPM, COPD, GERD, A fib, CAD, and PAH.   Admitted 3/28 through 01/08/2018 with increased dyspnea. Diuresed with lasix and transitioned to torsemide 20 mg twice a day. Treated for acute bronchitis. Respiratory panel + for rhinovirus. Placed on steroid taper. Discharged to [facility] 01/08/2018.   She signed out AMA from [facility]- and did not have any medications.   Admitted with increased cough and dyspnea. Prior to admit she was not taking her medications but restarted on the day of admit. CXR with pulmonary edema.  Started on azithromycin due to recent infection. Creatinine on admit 1.62.   PYP scan negative for amyloid.   Echo 12/30/2017  - Left ventricle: Septal flattening indicative of elevated PA   pressures. Wall thickness was increased in a pattern of moderate   LVH. Systolic function was normal. The estimated ejection   fraction was in the range of 55% to 60%. The study is not   technically sufficient to allow evaluation of LV diastolic   function. - Mitral valve: There was mild regurgitation. - Left atrium: The atrium was severely dilated. - Right ventricle: The cavity size was moderately dilated. - Right atrium: The atrium was severely dilated. - Atrial septum: No defect or patent foramen ovale was identified. - Tricuspid valve: There was moderate regurgitation. - Pulmonary arteries: PA peak pressure: 58 mm Hg (S). Review of Systems: [y] = yes, [ ]  = no   General: Weight gain [ ] ; Weight loss [ ] ; Anorexia [ ] ; Fatigue [Y ]; Fever [ ] ; Chills [ ] ; Weakness [ ]   Cardiac: Chest  pain/pressure [ ] ; Resting SOB [ ] ; Exertional SOB [ Y]; Orthopnea [Y ]; Pedal Edema [ Y]; Palpitations [ ] ; Syncope [ ] ; Presyncope [ ] ; Paroxysmal nocturnal dyspnea[ ]   Pulmonary: Cough [ ] ; Wheezing[ ] ; Hemoptysis[ ] ; Sputum [ ] ; Snoring [ ]   GI: Vomiting[ ] ; Dysphagia[ ] ; Melena[ ] ; Hematochezia [ ] ; Heartburn[ ] ; Abdominal pain [ ] ; Constipation [ ] ; Diarrhea [ ] ; BRBPR [ ]   GU: Hematuria[ ] ; Dysuria [ ] ; Nocturia[ ]   Vascular: Pain in legs with walking [ ] ; Pain in feet with lying flat [ ] ; Non-healing sores [ ] ; Stroke [ ] ; TIA [ ] ; Slurred speech [ ] ;  Neuro: Headaches[ ] ; Vertigo[ ] ; Seizures[ ] ; Paresthesias[ ] ;Blurred vision [ ] ; Diplopia [ ] ; Vision changes [ ]   Ortho/Skin: Arthritis [ ] ; Joint pain [ ] ; Muscle pain [ ] ; Joint swelling [ ] ; Back Pain [Y ]; Rash [ ]   Psych: Depression[ ] ; Anxiety[ ]   Heme: Bleeding problems [ ] ; Clotting disorders [ ] ; Anemia [ ]   Endocrine: Diabetes [Y ]; Thyroid dysfunction[ ]   Home Medications Prior to Admission medications   Medication Sig Start Date End Date Taking? Authorizing Provider  acetaminophen (TYLENOL) 325 MG tablet Take 2 tablets (650 mg total) by mouth every 6 (six) hours as needed for mild pain, fever or headache. 01/08/18  Yes Velvet Bathe, MD  ADVAIR DISKUS 500-50 MCG/DOSE AEPB Inhale 1 puff into the lungs every 12 (twelve) hours. 05/15/16  Yes [provider]  albuterol (PROVENTIL HFA;VENTOLIN HFA) 108 (90 BASE) MCG/ACT inhaler Inhale 2 puffs into the lungs every 6 (six) hours as needed for wheezing or shortness of breath.   Yes [provider]  apixaban (ELIQUIS) 5 MG TABS tablet Take 1 tablet (5 mg total) by mouth 2 (two) times daily. 07/29/17  Yes Bensimhon, Shaune Pascal, MD  atorvastatin (LIPITOR) 80 MG tablet Take 80 mg by mouth at bedtime.     Yes [provider]  colchicine 0.6 MG tablet Take 0.6-1.2 mg by mouth daily as needed (for gout flares). Reported on 03/04/2016 08/09/15  Yes [provider]    folic acid (FOLVITE) 1 MG tablet Take 1 mg by mouth daily. 06/01/15  Yes [provider]  LANTUS 100 UNIT/ML injection Inject 0.05 mLs (5 Units total) into the skin daily before breakfast. Patient taking differently: Inject 10 Units into the skin daily before breakfast.  01/08/18  Yes Velvet Bathe, MD  levothyroxine (SYNTHROID, LEVOTHROID) 100 MCG tablet Take 100 mcg by mouth daily. 05/02/16  Yes [provider]  macitentan (OPSUMIT) 10 MG tablet Take 1 tablet (10 mg total) by mouth daily. 01/09/18  Yes Bensimhon, Shaune Pascal, MD  Multiple Vitamins-Minerals (MULTIVITAMIN WITH MINERALS) tablet Take 1 tablet by mouth daily.   Yes [provider]  Multiple Vitamins-Minerals (PRESERVISION AREDS 2) CAPS Take 1 capsule by mouth 2 (two) times daily.   Yes [provider]  nitroGLYCERIN (NITROSTAT) 0.4 MG SL tablet Place 1 tablet (0.4 mg total) under the tongue every 5 (five) minutes as needed for chest pain. 07/07/14 01/24/2018 Yes Deboraha Sprang, MD  potassium chloride SA (K-DUR,KLOR-CON) 20 MEQ tablet Take 20 mEq by mouth 2 (two) times daily.   Yes [provider]  ranolazine (RANEXA) 500 MG 12 hr tablet Take 500 mg by mouth 2 (two) times daily.   Yes [provider]  sildenafil (REVATIO) 20 MG tablet Take 2 tablets (40 mg total) by mouth 3 (three) times daily. 06/13/16  Yes Bensimhon, Shaune Pascal, MD  spironolactone (ALDACTONE) 25 MG tablet Take 0.5 tablets (12.5 mg total) by mouth daily. 11/21/17  Yes Bensimhon, Shaune Pascal, MD  sulfaSALAzine (AZULFIDINE) 500 MG tablet Take 1,000 mg by mouth 2 (two) times daily. 04/12/16  Yes [provider]  Tiotropium Bromide Monohydrate (SPIRIVA RESPIMAT) 2.5 MCG/ACT AERS INHALE 2 PUFFS INTO THE LUNGS DAILY. 07/26/16  Yes Brand Males, MD  torsemide (DEMADEX) 20 MG tablet Take 1 tablet (20 mg total) by mouth 2 (two) times daily. 01/08/18  Yes Velvet Bathe, MD  metoprolol tartrate (LOPRESSOR) 25 MG tablet Take 1 tablet  (25 mg total) by mouth 2 (two) times daily. 01/08/18   Velvet Bathe, MD  predniSONE (DELTASONE) 20 MG tablet Take 2 tablets (40 mg total) by mouth daily with breakfast. 01/09/18   Velvet Bathe, MD  allopurinol (ZYLOPRIM) 100 MG tablet Take 100 mg by mouth daily.    12/04/11  [provider]    Past Medical History: Past Medical History:  Diagnosis Date  . Atrial fibrillation ([place])    Status post pulmonary vein isolation 2009 at [facility]  . Atrial fibrillation ([place]) 12/18/2007   Annotation: refractory Qualifier: Diagnosis of  By: Doy Mince LPN, Megan    . CAD (coronary artery disease)    LHC 05/2007: pLAD 70-80%, pRCA 40%, EF 20-25%. LAD lesion treated medically.   . CHF (congestive heart failure) ([place])   . Chronic diastolic heart failure ([place])    Echo 06/2010: Moderate LVH, EF 55-65%,  normal wall motion, mild MR, moderate to severe LAE, mild RAE.   Marland Kitchen Chronic obstructive pulmonary disease ([place])   . COPD (chronic obstructive pulmonary disease) ([place])   . Coronary artery disease 06/15/2012  . Crohn's disease ([place])   . Depression   . Diabetes mellitus ([place]) 06/22/2012  . Diabetes mellitus without complication ([place])   . Dysrhythmia    atrial fib  . GERD (gastroesophageal reflux disease)   . Gout   . Hyperlipidemia   . Hypothyroidism    treated  . Morbid obesity ([place])   . Morbid obesity with BMI of 40.0-44.9, adult ([place]) 06/22/2012  . Nonischemic cardiomyopathy ([place])    history of,  EF 20-25% at left heart cath in 06/2007; echo 2011 had normal LV function  . Obstructive sleep apnea    continuous positive airway pressure not using at present  . On home oxygen therapy    "2L at night" (10/03/2015)  . Osteoporosis   . Pacemaker    Medtronic  . Peripheral vascular disease (HCC) 15   rt arm clots  . Seasonal allergies   . Shortness of breath dyspnea    exersion  . Tachy-brady syndrome ([place])    status post implant of a medtronic dual-chamber pacemaker in 2001.  explanted in 2010  -- [place] pacemaker in 2010.    Past Surgical History: Past Surgical History:  Procedure Laterality Date  . CARDIAC CATHETERIZATION  08/2000   noncritical disease mid RCA, EF preserved  . CARDIAC CATHETERIZATION  05/29/2007   noncritical disease, EF 20-25%  . CARDIAC CATHETERIZATION N/A 01/09/2016   Procedure: Right Heart Cath;  Surgeon: Jolaine Artist, MD;  Location: [facility];  Service: Cardiovascular;  Laterality: N/A;  . CARDIAC CATHETERIZATION N/A 07/15/2016   Procedure: Right Heart Cath;  Surgeon: Jolaine Artist, MD;  Location: [facility];  Service: Cardiovascular;  Laterality: N/A;  . CATARACT EXTRACTION W/ INTRAOCULAR LENS  IMPLANT, BILATERAL Bilateral 2014-2016  . EMBOLECTOMY Right 08/16/2014   Procedure: EMBOLECTOMY- RIGHT BRACHIAL ARTERY;  Surgeon: Serafina Mitchell, MD;  Location: [facility];  Service: Vascular;  Laterality: Right;  . PACEMAKER INSERTION  2010   medtronic ADAPTA   . PARATHYROIDECTOMY Right 01/30/2015   Procedure: PARATHYROIDECTOMY;  Surgeon: Armandina Gemma, MD;  Location: [facility];  Service: General;  Laterality: Right;  . PARATHYROIDECTOMY Left 10/03/2015   Left inferior parathyroidectomy w/neck exploration  . PARATHYROIDECTOMY Left 10/03/2015   Procedure: LEFT PARATHYROID EXPLORATION AND  PARATHYROIDECTOMY;  Surgeon: Armandina Gemma, MD;  Location: [facility];  Service: General;  Laterality: Left;  . post ablation pseudoaneurysm     at A fib ablation  . pulmonary vein isolation  05/12/2008   RFCA atrial fibrillation  . TONSILLECTOMY      Family History: Family History  Problem Relation Age of Onset  . Breast cancer Mother   . Heart disease Father   . Emphysema Father     Social History: Social History   Socioeconomic History  . Marital status: Divorced    Spouse name: Not on file  . Number of children: Not on file  . Years of education: Not on file  . Highest education level: Not on file  Occupational History  .  Occupation: retired  Scientific laboratory technician  . Financial resource strain: Not on file  . Food insecurity:    Worry: Not on file    Inability: Not on file  . Transportation needs:    Medical: Not on file  Non-medical: Not on file  Tobacco Use  . Smoking status: Former Smoker    Packs/day: 3.00    Years: 32.00    Pack years: 96.00    Last attempt to quit: 09/30/1986    Years since quitting: 31.3  . Smokeless tobacco: Former Systems developer    Quit date: 08/15/1987  Substance and Sexual Activity  . Alcohol use: No    Alcohol/week: 0.0 oz  . Drug use: No  . Sexual activity: Never  Lifestyle  . Physical activity:    Days per week: Not on file    Minutes per session: Not on file  . Stress: Not on file  Relationships  . Social connections:    Talks on phone: Not on file    Gets together: Not on file    Attends religious service: Not on file    Active member of club or organization: Not on file    Attends meetings of clubs or organizations: Not on file    Relationship status: Not on file  Other Topics Concern  . Not on file  Social History Narrative   ** Merged History Encounter **        Allergies:  No Known Allergies  Objective:    Vital Signs:   Temp:  [97.8 F (36.6 C)-98.6 F (37 C)] 97.8 F (36.6 C) (04/15 0447) Pulse Rate:  [82-116] 116 (04/15 0447) Resp:  [20-29] 20 (04/15 0447) BP: (90-111)/(58-86) 108/78 (04/15 0447) SpO2:  [89 %-98 %] 89 % (04/15 0447) Weight:  [191 lb 12.8 oz (87 kg)-193 lb 5.5 oz (87.7 kg)] 193 lb 5.5 oz (87.7 kg) (04/15 0446) Last BM Date: 01/21/2018  Weight change: Filed Weights   01/08/2018 1451 01/12/18 0446  Weight: 191 lb 12.8 oz (87 kg) 193 lb 5.5 oz (87.7 kg)    Intake/Output:   Intake/Output Summary (Last 24 hours) at 01/12/2018 1156 Last data filed at 01/12/2018 0900 Gross per 24 hour  Intake 925 ml  Output 700 ml  Net 225 ml      Physical Exam    General:  Elderly  appearing. No resp difficulty HEENT: normal Neck: supple. JVP 14 .  Carotids 2+ bilat; no bruits. No lymphadenopathy or thyromegaly appreciated. Cor: PMI nondisplaced. irregular rate & rhythm. No rubs, gallops or murmurs. Lungs: Rhonchi Abdomen: soft, nontender, nondistended. No hepatosplenomegaly. No bruits or masses. Good bowel sounds. Extremities: no cyanosis, clubbing, rash, R and LLE 2+ edema Neuro: alert & orientedx3, cranial nerves grossly intact. moves all 4 extremities w/o difficulty. Affect pleasant   Telemetry    AFib   EKG    n/a  Labs   Basic Metabolic Panel: Recent Labs  Lab 01/06/18 0601 01/07/18 0428 01/08/18 0442 12/31/2017 0936 01/12/18 0556  NA 129* 130* 133* 139 139  K 3.7 3.1* 3.4* 3.7 4.4  CL 80* 81* 86* 92* 93*  CO2 35* 36* 36* 33* 28  GLUCOSE 101* 92 97 141* 129*  BUN 60* 48* 37* 47* 49*  CREATININE 1.65* 1.25* 1.09* 1.45* 1.62*  CALCIUM 7.9* 8.2* 8.0* 8.0* 8.0*    Liver Function Tests: Recent Labs  Lab 12/31/2017 0936  AST 88*  ALT 53  ALKPHOS 206*  BILITOT 1.2  PROT 7.1  ALBUMIN 2.9*   No results for input(s): LIPASE, AMYLASE in the last 168 hours. No results for input(s): AMMONIA in the last 168 hours.  CBC: Recent Labs  Lab 01/05/18 2251 01/06/18 0601 01/07/18 0428 01/08/18 0442 01/25/2018 0936 01/12/18 0556  WBC 10.3 10.5  9.0 12.4* 12.3* 18.1*  NEUTROABS 8.6*  --   --  10.1* 10.3*  --   HGB 9.8* 9.2* 9.5* 9.6* 10.6* 11.0*  HCT 32.7* 31.3* 32.3* 32.7* 36.4 38.3  MCV 89.3 88.7 89.0 89.8 90.8 94.6  PLT 376 382 393 366 494* 557*    Cardiac Enzymes: Recent Labs  Lab 01/08/2018 1652 01/20/2018 2053 01/05/2018 2339  TROPONINI 0.09* 0.09* 0.08*    BNP: BNP (last 3 results) Recent Labs    12/25/17 1737 01/24/2018 0936  BNP 2,089.7* 868.2*    ProBNP (last 3 results) No results for input(s): PROBNP in the last 8760 hours.   CBG: Recent Labs  Lab 01/08/18 1610 01/06/2018 1441 01/12/2018 1606 01/04/2018 2152 01/12/18 0759  GLUCAP 153* 118* 98 181* 128*    Coagulation Studies: No results  for input(s): LABPROT, INR in the last 72 hours.   Imaging    No results found.   Medications:     Current Medications: . apixaban  5 mg Oral BID  . atorvastatin  80 mg Oral QHS  . folic acid  1 mg Oral Daily  . insulin aspart  0-9 Units Subcutaneous TID WC  . insulin glargine  5 Units Subcutaneous QAC breakfast  . levothyroxine  100 mcg Oral QAC breakfast  . mouth rinse  15 mL Mouth Rinse BID  . potassium chloride SA  20 mEq Oral BID  . predniSONE  40 mg Oral Q breakfast  . ranolazine  500 mg Oral BID  . sulfaSALAzine  1,000 mg Oral BID     Infusions: . azithromycin Stopped (01/07/2018 1648)       Patient Profile   [name] is a 77 year old with a history of combined systolic/disastolic hf, HTN, DM, hyperlipidemia, hypothyroidism, tachy/brady s/p PPM, COPD, GERD, A fib, CAD, and PAH.   Admitted with increase dyspnea. Had not been taking HF meds.   Assessment/Plan   1. A/C Systolic/Diastolic HF ECHO 4/2 4098 EF 55-60%mild MR, severe biatrial enlargement, RV mod dilated, mod TR, PA peak pressure: 58 (Previously EF 40-45% on Echo 03/2017) Volume status elevated. Start IV lasix.  Follow renal function closely.   2. PAH/RV Failure Restart PAH meds. Sildenafil 20 mg tid and macitentan 10 mg daily.   3. AECOPD  On azithromycin and steroids per primary team.   4. Tachyt/Brady Medtronic PPM   5. Permanent A fib Rate controlled.  On eliquis.    6. CAD Cath 2013 with LAD 70-75% and ostial 95% DS No s/s ischemia On statin and eliquis.   7. DMII per primary team   8. CKD stage III  Creatinine baseline 1.3-1.6 Creatinine on admit 1.6.        Medication concerns reviewed with patient and pharmacy team. Barriers identified: Needs D/C plan to ensure she gets home medications.   Length of Stay: 1  Amy Clegg, NP  01/12/2018, 11:56 AM  Advanced Heart Failure Team Pager [phone] (M-F; 7a - 4p)  Please contact [facility] for night-coverage after hours  (4p -7a ) and weekends on [email or web address]  Patient seen with NP, agree with the above note.  She recently had a long admit from 3/27-4/11/19 with CHF and COPD exacerbations.  She was sent to [facility].  After a day or so, she eloped from the SNF and went home.  She did not have her medications and became progressively short of breath, now back in the hospital again with worsening dyspnea.  She was started on prednisone and azithromycin  for possible COPD exacerbation.  She was not started on diuretics due to soft BP. SBP in 90s-110s.  She remains short of breath.   On exam, JVP 14+ cm.  Irregular rhythm.  Rhonchi bilaterally.  1+ edema to knees bilaterally.   1. Acute on chronic diastolic CHF with prominent RV failure: Echo 4/19 with EF 55-60%, D-shaped septum, moderate RV dilation.  She is volume overloaded on exam.  Did not have diuretics at home after she left SNF AMA.  - Lasix 80 mg IV bid.  - Follow creatinine closely.  2. CKD: Stage 3.  Creatinine 1.6 which is around her baseline.  Follow with diuresis.  3. COPD: On home oxygen.  She is being treated for AECOPD with azithromycin/prednisone/nebs.  4. Atrial fibrillation: Permanent, on apixaban.  Rate controlled.  5. Tachy-brady syndrome: MDT PPM.  6. PAH/RV failure: Thought to be mixed group 1 and group 3 (COPD).  Has been on macitentan and sildenafil at home.  Can restart.  7. CAD: Moderate.  No chest pain.   Loralie Champagne 01/12/2018 1:14 PM

## 2018-01-12 NOTE — Progress Notes (Signed)
MD paged regarding patient no urine output  despite of Lasix 80mg   IV given @1307 .  MD Ordered to do I&O cath. Will monitor.

## 2018-01-12 NOTE — Progress Notes (Signed)
Bladder Scan- 170ml.  In &Out Cath -212ml

## 2018-01-12 NOTE — NC FL2 (Signed)
[place] LEVEL OF CARE SCREENING TOOL     IDENTIFICATION  Patient Name: [name] Birthdate: 1941-06-19 Sex: female Admission Date (Current Location): 01/25/2018  [facility] and [place] Number:  Herbalist and Address:  [facility] [place], [place] [address], [place], [place] [address]      Provider Number: [id number]  Attending Physician Name and Address:  Nita Sells, MD  Relative Name and Phone Number:       Current Level of Care: Hospital Recommended Level of Care: [place] Prior Approval Number:    Date Approved/Denied:   PASRR Number: [id number]  Discharge Plan: SNF    Current Diagnoses: Patient Active Problem List   Diagnosis Date Noted  . Left lower lobe consolidation ([place]) 01/25/2018  . Acute exacerbation of CHF (congestive heart failure) ([place]) 01/20/2018  . Dyslipidemia associated with type 2 diabetes mellitus ([place]) 01/10/2018  . Acute bronchitis due to Rhinovirus 01/05/2018  . Leukocytosis   . PICC (peripherally inserted central catheter) in place   . Respiratory crackles at left lung base   . Cough   . Frontal headache   . Generalized weakness 12/29/2017  . Acute on chronic right-sided congestive heart failure ([place]) 12/28/2017  . Anemia 12/25/2017  . Hypokalemia   . Chronic atrial fibrillation ([place])   . Pulmonary hypertension ([place]), RHC on 01/09/2016 01/09/2016  . Hypomagnesemia 10/13/2015  . Fever 03/28/2015  . CKD (chronic kidney disease) stage 3, GFR 30-59 ml/min (HCC) 03/13/2015  . Crohn's disease of large intestine without complication ([place]) 62/95/2841  . Gastro-esophageal reflux disease without esophagitis 03/13/2015  . Chronic gout 03/13/2015  . Hypertensive heart disease with CHF (congestive heart failure) ([place]) 03/13/2015  . Hypothyroidism 03/13/2015  . Extreme obesity 03/13/2015  . Osteopenia 03/13/2015  . Primary hyperparathyroidism ([place]) 03/13/2015  . Type 2 diabetes  mellitus with diabetic nephropathy ([place]) 03/13/2015  . OSA (obstructive sleep apnea) 02/18/2015  . Morbid obesity with BMI of 40.0-44.9, adult ([place]) 06/22/2012  . Coronary artery disease 06/15/2012  . Long term current use of anticoagulant therapy 01/11/2011  . CARDIAC PACEMAKER IN SITU-MEDTRONIC ADAPTA L 01/15/2010  . OTHER OSTEOPOROSIS 09/05/2008  . COPD with emphysema ([place]) 01/02/2008  . Anxiety state 12/18/2007    Orientation RESPIRATION BLADDER Height & Weight     Self, Time, Situation, Place  O2(Nasal Canula 3 L) Incontinent, External catheter Weight: 193 lb 5.5 oz (87.7 kg) Height:  5\' 11"  (180.3 cm)  BEHAVIORAL SYMPTOMS/MOOD NEUROLOGICAL BOWEL NUTRITION STATUS  (None) (None) Incontinent Diet(Heart healthy/carb modified.)  AMBULATORY STATUS COMMUNICATION OF NEEDS Skin   Extensive Assist Verbally Skin abrasions, Bruising                       Personal Care Assistance Level of Assistance  Bathing, Feeding, Dressing Bathing Assistance: Limited assistance Feeding assistance: Limited assistance Dressing Assistance: Limited assistance     Functional Limitations Info  Sight, Hearing, Speech Sight Info: Adequate Hearing Info: Adequate Speech Info: Adequate    SPECIAL CARE FACTORS FREQUENCY  PT (By licensed PT), OT (By licensed OT)     PT Frequency: 5 x week OT Frequency: 5 x week            Contractures Contractures Info: Not present    Additional Factors Info  Code Status, Allergies, Psychotropic Code Status Info: Full Allergies Info: NKDA   Insulin Sliding Scale Info: Anxiety: No psychotropic meds.       Current Medications (01/12/2018):  This is the current hospital active medication list Current Facility-Administered Medications  Medication Dose Route Frequency Provider Last Rate Last Dose  . acetaminophen (TYLENOL) tablet 650 mg  650 mg Oral Q6H PRN Rondel Jumbo, PA-C       Or  . acetaminophen (TYLENOL) suppository 650 mg  650 mg Rectal Q6H PRN  Rondel Jumbo, PA-C      . albuterol (PROVENTIL) (2.5 MG/3ML) 0.083% nebulizer solution 2.5 mg  2.5 mg Nebulization Q2H PRN Rondel Jumbo, PA-C      . apixaban (ELIQUIS) tablet 5 mg  5 mg Oral BID Sharene Butters E, PA-C   5 mg at 01/12/18 0904  . atorvastatin (LIPITOR) tablet 80 mg  80 mg Oral QHS Rondel Jumbo, PA-C   80 mg at 12/29/2017 2051  . azithromycin (ZITHROMAX) 250 mg in dextrose 5 % 125 mL IVPB  250 mg Intravenous Q24H Rondel Jumbo, PA-C 125 mL/hr at 01/12/18 1307 250 mg at 01/12/18 1307  . bisacodyl (DULCOLAX) suppository 10 mg  10 mg Rectal Daily PRN Rondel Jumbo, PA-C      . folic acid (FOLVITE) tablet 1 mg  1 mg Oral Daily Rondel Jumbo, PA-C   1 mg at 01/12/18 0904  . furosemide (LASIX) injection 80 mg  80 mg Intravenous BID Clegg, Amy D, NP   80 mg at 01/12/18 1307  . HYDROcodone-acetaminophen (NORCO/VICODIN) 5-325 MG per tablet 1-2 tablet  1-2 tablet Oral Q4H PRN Rondel Jumbo, PA-C      . insulin aspart (novoLOG) injection 0-9 Units  0-9 Units Subcutaneous TID WC Rondel Jumbo, PA-C   1 Units at 01/12/18 8469  . insulin glargine (LANTUS) injection 5 Units  5 Units Subcutaneous QAC breakfast Rondel Jumbo, PA-C   5 Units at 01/12/18 6295  . ipratropium-albuterol (DUONEB) 0.5-2.5 (3) MG/3ML nebulizer solution 3 mL  3 mL Nebulization Q4H PRN Rondel Jumbo, PA-C      . levothyroxine (SYNTHROID, LEVOTHROID) tablet 100 mcg  100 mcg Oral QAC breakfast Rondel Jumbo, PA-C   100 mcg at 01/12/18 2841  . macitentan (OPSUMIT) tablet 10 mg  10 mg Oral Daily Clegg, Amy D, NP      . MEDLINE mouth rinse  15 mL Mouth Rinse BID Jani Gravel, MD   15 mL at 01/12/18 0905  . ondansetron (ZOFRAN) tablet 4 mg  4 mg Oral Q6H PRN Rondel Jumbo, PA-C       Or  . ondansetron [facility]) injection 4 mg  4 mg Intravenous Q6H PRN Sharene Butters E, PA-C      . potassium chloride SA (K-DUR,KLOR-CON) CR tablet 20 mEq  20 mEq Oral BID Rondel Jumbo, PA-C   20 mEq at 01/12/18 0904  . predniSONE  (DELTASONE) tablet 40 mg  40 mg Oral Q breakfast Rondel Jumbo, PA-C   40 mg at 01/12/18 3244  . ranolazine (RANEXA) 12 hr tablet 500 mg  500 mg Oral BID Wertman, Sara E, PA-C      . senna-docusate (Senokot-S) tablet 1 tablet  1 tablet Oral QHS PRN Rondel Jumbo, PA-C      . sildenafil (REVATIO) tablet 20 mg  20 mg Oral TID Clegg, Amy D, NP      . sulfaSALAzine (AZULFIDINE) tablet 1,000 mg  1,000 mg Oral BID Rondel Jumbo, PA-C   1,000 mg at 01/12/18 0102     Discharge Medications: Please see discharge summary for a list of discharge  medications.  Relevant Imaging Results:  Relevant Lab Results:   Additional Information SS#: [id number]. Discharged to [facility] on 4/11. Left AMA on 4/12.  Candie Chroman, LCSW

## 2018-01-12 NOTE — Progress Notes (Signed)
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  ACOPD-continue azithromycin 250 patient white count is 18 however patient was on prednisone on discharge-repeat chest x-ray in a.m. to view to determine if infiltrates have cleared and if there is actually pneumonia-continue prednisone 40 daily stop date 4/17-DuoNeb, albuterol  Recent rhinoviral infection-likely has exacerbated her chronic underlying issues-no further management  Chronic respiratory failure-currently on oxygen and states that about a month ago she only needed it at night-we will need desat screen prior to discharge  Hypothyroidism continue Synthroid 100 mcg daily, TSH needs checking in about 3 weeks  CAD status post cath 2013-on statin-continue  Ranexa 500 twice daily  Gout continue colchicine if flare hold for now   Eliquis, full code, no family at bedside called and left message for daughter, inpatient pending resolution  Consultants:   Advanced heart failure team  Procedures:   None  Antimicrobials:   Azithromycin  Subjective: Awake alert but sleepy at times and slow speech 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 [employer] G-has no pain in the chest shortness of breath is about the same no other specific complaint  Objective: Vitals:   01/05/2018 2021 01/12/18 0203 01/12/18 0446 01/12/18 0447  BP: 91/63 109/76  108/78  Pulse: (!) 101 (!) 105  (!) 116  Resp: 20 20  20   Temp: 98.2 F (36.8 C) 98.1 F (36.7 C)  97.8 F (36.6 C)  TempSrc: Oral Oral  Oral  SpO2: 98% 90%  (!) 89%  Weight:   87.7 kg (193 lb 5.5 oz)   Height:        Intake/Output Summary (Last 24 hours) at 01/12/2018 1333 Last data filed at 01/12/2018 0900 Gross per 24 hour  Intake 925 ml  Output 700 ml  Net 225 ml   Filed Weights   01/04/2018 1451 01/12/18 0446  Weight: 87 kg (191 lb 12.8 oz) 87.7 kg (193 lb 5.5 oz)    Examination: JVD 4, no bruit, sleepy appearing, Mallampati 2, S1-S2 HSM 2/6 LLSE-carotid Abdomen soft no rebound no guarding Trace lower extremity edema around ankles Power 5/5 No rales no rhonchi No pallor no icterus Neurologically intact however sleepy appearing   Data Reviewed: I have personally reviewed following labs and imaging studies  CBC: Recent Labs  Lab 01/05/18 2251 01/06/18 0601 01/07/18 0428 01/08/18 0442 01/19/2018 0936 01/12/18 0556  WBC 10.3 10.5 9.0 12.4* 12.3* 18.1*  NEUTROABS 8.6*  --   --  10.1* 10.3*  --   HGB  9.8* 9.2* 9.5* 9.6* 10.6* 11.0*  HCT 32.7* 31.3* 32.3* 32.7* 36.4 38.3  MCV 89.3 88.7 89.0 89.8 90.8 94.6  PLT 376 382 393 366 494* 767*   Basic Metabolic Panel: Recent Labs  Lab 01/06/18 0601 01/07/18 0428 01/08/18 0442 01/14/2018 0936 01/12/18 0556  NA 129* 130* 133* 139 139  K  3.7 3.1* 3.4* 3.7 4.4  CL 80* 81* 86* 92* 93*  CO2 35* 36* 36* 33* 28  GLUCOSE 101* 92 97 141* 129*  BUN 60* 48* 37* 47* 49*  CREATININE 1.65* 1.25* 1.09* 1.45* 1.62*  CALCIUM 7.9* 8.2* 8.0* 8.0* 8.0*   GFR: Estimated Creatinine Clearance: 35.6 mL/min (A) (by C-G formula based on SCr of 1.62 mg/dL (H)). Liver Function Tests: Recent Labs  Lab 01/10/2018 0936  AST 88*  ALT 53  ALKPHOS 206*  BILITOT 1.2  PROT 7.1  ALBUMIN 2.9*   No results for input(s): LIPASE, AMYLASE in the last 168 hours. No results for input(s): AMMONIA in the last 168 hours. Coagulation Profile: No results for input(s): INR, PROTIME in the last 168 hours. Cardiac Enzymes:  Radiology Studies: Reviewed images personally in health database   Scheduled Meds: . apixaban  5 mg Oral BID  . atorvastatin  80 mg Oral QHS  . folic acid  1 mg Oral Daily  . furosemide  80 mg Intravenous BID  . insulin aspart  0-9 Units Subcutaneous TID WC  . insulin glargine  5 Units Subcutaneous QAC breakfast  . levothyroxine  100 mcg Oral QAC breakfast  . macitentan  10 mg Oral Daily  . mouth rinse  15 mL Mouth Rinse BID  . potassium chloride SA  20 mEq Oral BID  . predniSONE  40 mg Oral Q breakfast  . ranolazine  500 mg Oral BID  . sildenafil  20 mg Oral TID  . sulfaSALAzine  1,000 mg Oral BID   Continuous Infusions: . azithromycin 250 mg (01/12/18 1307)     LOS: 1 day    Time spent: [place], MD [facility] ([phone]  If 7PM-7AM, please contact night-coverage [email or web address] Password [email or web address] 01/12/2018, 1:33 PM

## 2018-01-12 NOTE — Evaluation (Signed)
Occupational Therapy Evaluation Patient Details Name: [name] MRN: [id number] DOB: November 20, 1940 Today's Date: 01/12/2018    History of Present Illness [name] is a 77 y.o. female with medical history significant for diabetes, hypertension, Crohn's disease, depression, paroxysmal atrial fibrillation, OSA, COPD not recently using oxygen, CHF with EF 55%, chronic anemia, recent hospitalization from 12/25/2017 through 01/08/2018 for CHF exacerbation and acute bronchitis, discharged on prednisone,, discharged to rehab facility, leaving one day later because "could not stand it there ", since then, the patient over the last 3 days, has not been compliant with her meds, except today, when she decided to resume them.  However, she reported increasing cough, with frothy sputum, increasing shortness of breath, and was noted to have O2 sat in the 80s, requiring 3 L on arrival.     Clinical Impression   Patient presenting with decreased I in self care, balance, functional mobility, safety awareness, and endurance. Pt with reports being mod I  PTA with AD and living alone. However, she was recently at [facility] for therapy and reports, " I had to leave. It was the worse thing ever." Pt reporting increased pain "all over" and anxiety. RN notified. Pt on 4 L o2 via [place] with saturation at 88-90% while supine in bed. Pt's HR also ranging from 100-120 bpm while supine.  Patient currently functioning at min A. Patient will benefit from acute OT to increase overall independence in the areas of ADLs, functional mobility,and balance in order to safely discharge to next venue of care.    Follow Up Recommendations  SNF;Supervision/Assistance - 24 hour    Equipment Recommendations  Other (comment)(defer to next venue of care)    Recommendations for Other Services       Precautions / Restrictions Precautions Precautions: Fall Restrictions Weight Bearing Restrictions: No      Mobility Bed  Mobility Overal bed mobility: Needs Assistance Bed Mobility: Rolling Rolling: Supervision   Supine to sit: Supervision        Transfers       General transfer comment: pt refused functional transfer secondary to pt reporting, " I'm too weak."        ADL either performed or assessed with clinical judgement   ADL Overall ADL's : Needs assistance/impaired     General ADL Comments: Pt declined all ADL tasks this session.                   Pertinent Vitals/Pain Pain Assessment: Faces Faces Pain Scale: Hurts little more Pain Location: generalized. no location stated Pain Descriptors / Indicators: Discomfort Pain Intervention(s): Monitored during session;Limited activity within patient's tolerance     Hand Dominance Right   Extremity/Trunk Assessment Upper Extremity Assessment Upper Extremity Assessment: Generalized weakness   Lower Extremity Assessment Lower Extremity Assessment: Generalized weakness       Communication Communication Communication: HOH   Cognition Arousal/Alertness: Awake/alert Behavior During Therapy: Flat affect Overall Cognitive Status: Impaired/Different from baseline Area of Impairment: Safety/judgement;Awareness      Awareness: Emergent                  Home Living Family/patient expects to be discharged to:: Skilled nursing facility Living Arrangements: Alone Available Help at Discharge: Family;Available PRN/intermittently Type of Home: House Home Access: Stairs to enter [employer] of Steps: threshold Entrance Stairs-Rails: None Home Layout: One level     Bathroom Shower/Tub: Astronomer Accessibility: Yes   Home Equipment: Environmental consultant - 2 wheels;Walker -  4 wheels;Shower seat;Bedside commode   Additional Comments: pt was recently admitted to hospital and d/cd to SNF but tleft after a day because 'i couldn't stand it"      Prior Functioning/Environment Level of Independence: Independent  with assistive device(s)        Comments: Mod indep with RW. Drives        OT Problem List: Decreased strength;Impaired balance (sitting and/or standing);Pain;Decreased range of motion;Decreased safety awareness;Cardiopulmonary status limiting activity;Decreased activity tolerance;Decreased coordination;Decreased knowledge of use of DME or AE      OT Treatment/Interventions: Self-care/ADL training;Manual therapy;Therapeutic exercise;Modalities;Patient/family education;Balance training;Energy conservation;Therapeutic activities;DME and/or AE instruction;Cognitive remediation/compensation    OT Goals(Current goals can be found in the care plan section) Acute Rehab OT Goals Patient Stated Goal: get better OT Goal Formulation: With patient Time For Goal Achievement: 01/26/18 Potential to Achieve Goals: Fair ADL Goals Pt Will Perform Lower Body Bathing: with supervision Pt Will Perform Lower Body Dressing: with supervision Pt Will Transfer to Toilet: with supervision Pt Will Perform Toileting - Clothing Manipulation and hygiene: with supervision  OT Frequency: Min 2X/week   Barriers to D/C: Decreased caregiver support             AM-PAC PT "6 Clicks" Daily Activity     Outcome Measure Help from another person eating meals?: None Help from another person taking care of personal grooming?: A Little Help from another person toileting, which includes using toliet, bedpan, or urinal?: A Lot Help from another person bathing (including washing, rinsing, drying)?: A Lot Help from another person to put on and taking off regular upper body clothing?: A Little Help from another person to put on and taking off regular lower body clothing?: A Lot 6 Click Score: 16   End of Session Equipment Utilized During Treatment: Oxygen Nurse Communication: Other (comment)(vitals)  Activity Tolerance: Patient limited by fatigue Patient left: in bed;with call bell/phone within reach;with bed alarm  set  OT Visit Diagnosis: Unsteadiness on feet (R26.81);Muscle weakness (generalized) (M62.81)                Time: 3244-0102 OT Time Calculation (min): 12 min Charges:  OT General Charges $OT Visit: 1 Visit OT Evaluation $OT Eval Moderate Complexity: 1 Mod G-Codes:      Gypsy Decant 01/28/18, 2:02 PM

## 2018-01-13 ENCOUNTER — Other Ambulatory Visit (HOSPITAL_COMMUNITY): Payer: Self-pay | Admitting: Cardiology

## 2018-01-13 ENCOUNTER — Inpatient Hospital Stay (HOSPITAL_COMMUNITY): Payer: Medicare Other | Admitting: Certified Registered Nurse Anesthetist

## 2018-01-13 ENCOUNTER — Inpatient Hospital Stay (HOSPITAL_COMMUNITY): Payer: Medicare Other

## 2018-01-13 ENCOUNTER — Encounter: Payer: Self-pay | Admitting: Internal Medicine

## 2018-01-13 DIAGNOSIS — J9602 Acute respiratory failure with hypercapnia: Secondary | ICD-10-CM

## 2018-01-13 DIAGNOSIS — G9340 Encephalopathy, unspecified: Secondary | ICD-10-CM

## 2018-01-13 DIAGNOSIS — J9601 Acute respiratory failure with hypoxia: Secondary | ICD-10-CM

## 2018-01-13 DIAGNOSIS — I50813 Acute on chronic right heart failure: Secondary | ICD-10-CM

## 2018-01-13 DIAGNOSIS — J9621 Acute and chronic respiratory failure with hypoxia: Secondary | ICD-10-CM

## 2018-01-13 DIAGNOSIS — J9622 Acute and chronic respiratory failure with hypercapnia: Secondary | ICD-10-CM

## 2018-01-13 LAB — CBC
HCT: 36.3 % (ref 36.0–46.0)
HEMOGLOBIN: 10.1 g/dL — AB (ref 12.0–15.0)
MCH: 27 pg (ref 26.0–34.0)
MCHC: 27.8 g/dL — AB (ref 30.0–36.0)
MCV: 97.1 fL (ref 78.0–100.0)
PLATELETS: 583 10*3/uL — AB (ref 150–400)
RBC: 3.74 MIL/uL — ABNORMAL LOW (ref 3.87–5.11)
RDW: 26.3 % — ABNORMAL HIGH (ref 11.5–15.5)
WBC: 29.9 10*3/uL — ABNORMAL HIGH (ref 4.0–10.5)

## 2018-01-13 LAB — BLOOD GAS, ARTERIAL
ACID-BASE EXCESS: 4.7 mmol/L — AB (ref 0.0–2.0)
BICARBONATE: 33.7 mmol/L — AB (ref 20.0–28.0)
Drawn by: 227661
FIO2: 100
O2 SAT: 78.3 %
PATIENT TEMPERATURE: 98.6
pCO2 arterial: 109 mmHg (ref 32.0–48.0)
pH, Arterial: 7.116 — CL (ref 7.350–7.450)
pO2, Arterial: 56.1 mmHg — ABNORMAL LOW (ref 83.0–108.0)

## 2018-01-13 LAB — RESPIRATORY PANEL BY PCR
Adenovirus: NOT DETECTED
BORDETELLA PERTUSSIS-RVPCR: NOT DETECTED
CORONAVIRUS 229E-RVPPCR: NOT DETECTED
Chlamydophila pneumoniae: NOT DETECTED
Coronavirus HKU1: NOT DETECTED
Coronavirus NL63: NOT DETECTED
Coronavirus OC43: NOT DETECTED
INFLUENZA B-RVPPCR: NOT DETECTED
Influenza A H3: DETECTED — AB
MYCOPLASMA PNEUMONIAE-RVPPCR: NOT DETECTED
Metapneumovirus: NOT DETECTED
PARAINFLUENZA VIRUS 4-RVPPCR: NOT DETECTED
Parainfluenza Virus 1: NOT DETECTED
Parainfluenza Virus 2: NOT DETECTED
Parainfluenza Virus 3: NOT DETECTED
RESPIRATORY SYNCYTIAL VIRUS-RVPPCR: NOT DETECTED
Rhinovirus / Enterovirus: DETECTED — AB

## 2018-01-13 LAB — DIFFERENTIAL
BAND NEUTROPHILS: 10 %
Basophils Absolute: 0 10*3/uL (ref 0.0–0.1)
Basophils Relative: 0 %
Blasts: 0 %
EOS PCT: 0 %
Eosinophils Absolute: 0 10*3/uL (ref 0.0–0.7)
LYMPHS ABS: 1.2 10*3/uL (ref 0.7–4.0)
Lymphocytes Relative: 4 %
METAMYELOCYTES PCT: 3 %
MONO ABS: 1.8 10*3/uL — AB (ref 0.1–1.0)
MONOS PCT: 6 %
MYELOCYTES: 1 %
NEUTROS ABS: 26.9 10*3/uL — AB (ref 1.7–7.7)
NRBC: 0 /100{WBCs}
Neutrophils Relative %: 76 %
Other: 0 %
Promyelocytes Relative: 0 %

## 2018-01-13 LAB — POCT I-STAT 3, ART BLOOD GAS (G3+)
ACID-BASE EXCESS: 5 mmol/L — AB (ref 0.0–2.0)
BICARBONATE: 34.3 mmol/L — AB (ref 20.0–28.0)
O2 Saturation: 95 %
PH ART: 7.274 — AB (ref 7.350–7.450)
TCO2: 37 mmol/L — AB (ref 22–32)
pCO2 arterial: 73.6 mmHg (ref 32.0–48.0)
pO2, Arterial: 86 mmHg (ref 83.0–108.0)

## 2018-01-13 LAB — BASIC METABOLIC PANEL
Anion gap: 15 (ref 5–15)
BUN: 60 mg/dL — ABNORMAL HIGH (ref 6–20)
CALCIUM: 7.5 mg/dL — AB (ref 8.9–10.3)
CHLORIDE: 92 mmol/L — AB (ref 101–111)
CO2: 29 mmol/L (ref 22–32)
CREATININE: 2.25 mg/dL — AB (ref 0.44–1.00)
GFR calc Af Amer: 23 mL/min — ABNORMAL LOW (ref >60)
GFR calc non Af Amer: 20 mL/min — ABNORMAL LOW (ref >60)
GLUCOSE: 173 mg/dL — AB (ref 65–99)
Potassium: 5.6 mmol/L — ABNORMAL HIGH (ref 3.5–5.1)
Sodium: 136 mmol/L (ref 135–145)

## 2018-01-13 LAB — URINALYSIS, ROUTINE W REFLEX MICROSCOPIC
Glucose, UA: NEGATIVE mg/dL
HGB URINE DIPSTICK: NEGATIVE
Ketones, ur: NEGATIVE mg/dL
LEUKOCYTES UA: NEGATIVE
Nitrite: POSITIVE — AB
PROTEIN: 30 mg/dL — AB
Specific Gravity, Urine: >1.03 — ABNORMAL HIGH (ref 1.005–1.030)
pH: 5 (ref 5.0–8.0)

## 2018-01-13 LAB — URINALYSIS, MICROSCOPIC (REFLEX): RBC / HPF: NONE SEEN RBC/hpf (ref 0–5)

## 2018-01-13 LAB — RAPID URINE DRUG SCREEN, HOSP PERFORMED
Amphetamines: NOT DETECTED
BARBITURATES: NOT DETECTED
Benzodiazepines: NOT DETECTED
COCAINE: NOT DETECTED
OPIATES: NOT DETECTED
Tetrahydrocannabinol: NOT DETECTED

## 2018-01-13 LAB — GLUCOSE, CAPILLARY
GLUCOSE-CAPILLARY: 162 mg/dL — AB (ref 65–99)
GLUCOSE-CAPILLARY: 165 mg/dL — AB (ref 65–99)
Glucose-Capillary: 139 mg/dL — ABNORMAL HIGH (ref 65–99)
Glucose-Capillary: 139 mg/dL — ABNORMAL HIGH (ref 65–99)

## 2018-01-13 LAB — URINE CULTURE: Culture: <10000 — AB

## 2018-01-13 LAB — TROPONIN I: Troponin I: 0.1 ng/mL (ref <0.03)

## 2018-01-13 LAB — ALBUMIN: Albumin: 2.6 g/dL — ABNORMAL LOW (ref 3.5–5.0)

## 2018-01-13 LAB — PHOSPHORUS: Phosphorus: 7.5 mg/dL — ABNORMAL HIGH (ref 2.5–4.6)

## 2018-01-13 MED ORDER — MORPHINE SULFATE (PF) 4 MG/ML IV SOLN
5.0000 mg | Freq: Once | INTRAVENOUS | Status: AC
  Administered 2018-01-13: 5 mg via INTRAVENOUS

## 2018-01-13 MED ORDER — MORPHINE SULFATE (PF) 4 MG/ML IV SOLN
INTRAVENOUS | Status: AC
  Administered 2018-01-13: 4 mg via INTRAVENOUS
  Filled 2018-01-13: qty 1

## 2018-01-13 MED ORDER — FENTANYL 2500MCG IN NS 250ML (10MCG/ML) PREMIX INFUSION
25.0000 ug/h | INTRAVENOUS | Status: DC
  Administered 2018-01-13: 50 ug/h via INTRAVENOUS
  Filled 2018-01-13: qty 250

## 2018-01-13 MED ORDER — PHENYLEPHRINE HCL 10 MG/ML IJ SOLN
INTRAMUSCULAR | Status: DC | PRN
  Administered 2018-01-13: 400 ug via INTRAVENOUS

## 2018-01-13 MED ORDER — MORPHINE SULFATE (PF) 2 MG/ML IV SOLN
1.0000 mg | Freq: Once | INTRAVENOUS | Status: AC
  Administered 2018-01-13: 1 mg via INTRAVENOUS

## 2018-01-13 MED ORDER — FENTANYL CITRATE (PF) 100 MCG/2ML IJ SOLN: 50.0000 ug | Freq: Once | INTRAMUSCULAR | Status: DC

## 2018-01-13 MED ORDER — MIDAZOLAM HCL 50 MG/10ML IJ SOLN
5.0000 mg/h | INTRAMUSCULAR | Status: DC
  Administered 2018-01-13: 5 mg/h via INTRAVENOUS
  Filled 2018-01-13: qty 10

## 2018-01-13 MED ORDER — MORPHINE SULFATE (PF) 4 MG/ML IV SOLN
4.0000 mg | Freq: Once | INTRAVENOUS | Status: AC
  Administered 2018-01-13: 4 mg via INTRAVENOUS

## 2018-01-13 MED ORDER — LORAZEPAM 2 MG/ML IJ SOLN
INTRAMUSCULAR | Status: AC
  Filled 2018-01-13: qty 1

## 2018-01-13 MED ORDER — SUCCINYLCHOLINE CHLORIDE 20 MG/ML IJ SOLN
INTRAMUSCULAR | Status: DC | PRN
  Administered 2018-01-13: 60 mg via INTRAVENOUS

## 2018-01-13 MED ORDER — MORPHINE 100MG IN NS 100ML (1MG/ML) PREMIX INFUSION
10.0000 mg/h | INTRAVENOUS | Status: DC
  Administered 2018-01-13: 10 mg/h via INTRAVENOUS
  Filled 2018-01-13: qty 100

## 2018-01-13 MED ORDER — DEXMEDETOMIDINE HCL IN NACL 200 MCG/50ML IV SOLN
0.0000 ug/kg/h | INTRAVENOUS | Status: DC
  Administered 2018-01-13: 0.4 ug/kg/h via INTRAVENOUS
  Filled 2018-01-13: qty 50

## 2018-01-13 MED ORDER — MIDAZOLAM HCL 2 MG/2ML IJ SOLN
INTRAMUSCULAR | Status: AC
  Administered 2018-01-13: 5 mg via INTRAVENOUS
  Filled 2018-01-13: qty 6

## 2018-01-13 MED ORDER — MIDAZOLAM HCL 2 MG/2ML IJ SOLN
5.0000 mg | Freq: Once | INTRAMUSCULAR | Status: AC
  Administered 2018-01-13 (×2): 5 mg via INTRAVENOUS
  Filled 2018-01-13: qty 6

## 2018-01-13 MED ORDER — MORPHINE 100MG IN NS 100ML (1MG/ML) PREMIX INFUSION: 10.0000 mg/h | INTRAVENOUS | Status: DC

## 2018-01-13 MED ORDER — MORPHINE SULFATE (PF) 4 MG/ML IV SOLN
5.0000 mg | Freq: Once | INTRAVENOUS | Status: DC
  Filled 2018-01-13 (×2): qty 2

## 2018-01-13 MED ORDER — LEVOTHYROXINE SODIUM 100 MCG IV SOLR
50.0000 ug | Freq: Every day | INTRAVENOUS | Status: DC
  Administered 2018-01-13: 50 ug via INTRAVENOUS
  Filled 2018-01-13: qty 5

## 2018-01-13 MED ORDER — VANCOMYCIN HCL 10 G IV SOLR
1250.0000 mg | INTRAVENOUS | Status: DC
  Filled 2018-01-13: qty 1250

## 2018-01-13 MED ORDER — NYSTATIN 100000 UNIT/GM EX POWD
Freq: Three times a day (TID) | CUTANEOUS | Status: DC
  Administered 2018-01-13: 10:00:00 via TOPICAL
  Filled 2018-01-13: qty 15

## 2018-01-13 MED ORDER — SODIUM CHLORIDE 0.9 % IV SOLN: INTRAVENOUS | Status: DC

## 2018-01-13 MED ORDER — FENTANYL BOLUS VIA INFUSION
25.0000 ug | INTRAVENOUS | Status: DC | PRN
  Filled 2018-01-13: qty 25

## 2018-01-13 MED ORDER — ETOMIDATE 2 MG/ML IV SOLN
INTRAVENOUS | Status: DC | PRN
  Administered 2018-01-13: 10 mg via INTRAVENOUS

## 2018-01-13 MED ORDER — NALOXONE HCL 0.4 MG/ML IJ SOLN
INTRAMUSCULAR | Status: AC
  Filled 2018-01-13: qty 1

## 2018-01-13 MED ORDER — PROPOFOL 1000 MG/100ML IV EMUL
INTRAVENOUS | Status: AC
  Filled 2018-01-13: qty 100

## 2018-01-13 MED ORDER — VANCOMYCIN HCL IN DEXTROSE 1-5 GM/200ML-% IV SOLN
1000.0000 mg | INTRAVENOUS | Status: DC
  Administered 2018-01-13: 1000 mg via INTRAVENOUS
  Filled 2018-01-13: qty 200

## 2018-01-13 MED ORDER — EPHEDRINE SULFATE 50 MG/ML IJ SOLN
INTRAMUSCULAR | Status: DC | PRN
  Administered 2018-01-13: 50 mg via INTRAVENOUS

## 2018-01-13 MED ORDER — MORPHINE SULFATE (PF) 2 MG/ML IV SOLN
INTRAVENOUS | Status: AC
  Administered 2018-01-13: 5 mg via INTRAMUSCULAR
  Filled 2018-01-13: qty 1

## 2018-01-13 MED ORDER — MIDAZOLAM HCL 2 MG/2ML IJ SOLN
2.0000 mg | Freq: Once | INTRAMUSCULAR | Status: AC
  Administered 2018-01-13: 2 mg via INTRAVENOUS
  Filled 2018-01-13: qty 2

## 2018-01-13 MED ORDER — LORAZEPAM 2 MG/ML IJ SOLN: 0.5000 mg | INTRAMUSCULAR | Status: DC

## 2018-01-13 MED ORDER — NOREPINEPHRINE BITARTRATE 1 MG/ML IV SOLN
0.0000 ug/min | INTRAVENOUS | Status: DC
  Filled 2018-01-13: qty 4

## 2018-01-13 MED ORDER — METHYLPREDNISOLONE SODIUM SUCC 40 MG IJ SOLR
40.0000 mg | Freq: Every day | INTRAMUSCULAR | Status: DC
  Administered 2018-01-13: 40 mg via INTRAVENOUS
  Filled 2018-01-13: qty 1

## 2018-01-13 MED ORDER — PIPERACILLIN-TAZOBACTAM 3.375 G IVPB
3.3750 g | Freq: Three times a day (TID) | INTRAVENOUS | Status: DC
  Administered 2018-01-13: 3.375 g via INTRAVENOUS
  Filled 2018-01-13 (×2): qty 50

## 2018-01-14 MED FILL — Medication: Qty: 1 | Status: AC

## 2018-01-15 ENCOUNTER — Encounter (HOSPITAL_COMMUNITY): Payer: Self-pay | Admitting: *Deleted

## 2018-01-15 LAB — URINE CULTURE: Culture: >=100000 — AB

## 2018-01-15 NOTE — Progress Notes (Signed)
Received pt's Death Certificate from [employer], [place] completed and signed by Dr Haroldine Laws.  Copy faxed to them at [phone], they are aware to p/u original.

## 2018-01-16 ENCOUNTER — Encounter (HOSPITAL_COMMUNITY): Payer: Self-pay

## 2018-01-16 LAB — CULTURE, RESPIRATORY W GRAM STAIN: Culture: NORMAL

## 2018-01-16 LAB — CULTURE, RESPIRATORY

## 2018-01-18 LAB — CULTURE, BLOOD (ROUTINE X 2)
Culture: NO GROWTH
Culture: NO GROWTH
Special Requests: ADEQUATE

## 2018-01-23 ENCOUNTER — Encounter (HOSPITAL_COMMUNITY): Payer: Self-pay | Admitting: Internal Medicine

## 2018-01-23 ENCOUNTER — Encounter (HOSPITAL_COMMUNITY): Payer: Self-pay

## 2018-01-28 NOTE — Progress Notes (Signed)
Responded to the page for this daughter in the death of her mother.  Daughter and her significant other present and doing okay.  Both saying how sudden they thought this was and even though it wasn't exactly expected, they understand it.  Providing ministry of presence and prayer.  Thank you to the medical team for caring for this patient and her family.    01/24/2018 1310  Clinical Encounter Type  Visited With Patient and family together;Health care provider  Visit Type Initial;Spiritual support;Death  Spiritual Encounters  Spiritual Needs Prayer;Grief support

## 2018-01-28 NOTE — Progress Notes (Addendum)
Advanced Heart Failure Rounding Note  PCP-Cardiologist: No primary care provider on file.   Subjective:   Yesterday she was diuresed with 80 mg IV lasix and macitentan/sildenafil was restarted. Sluggish urine output.   Unresponsive this morning. Intubated and transferred to 69M. Started on IV fluids. Hypotensive.   Son-in law at bedside and says she is supposed to be DNR. Daughter has gone home to get advanced directives.    Objective:   Weight Range: 193 lb 5.5 oz (87.7 kg) Body mass index is 26.97 kg/m.   Vital Signs:   Temp:  [97.5 F (36.4 C)-98.1 F (36.7 C)] 97.5 F (36.4 C) (04/16 0843) Pulse Rate:  [93-134] 122 (04/16 0843) Resp:  [15-25] 19 (04/16 0843) BP: (60-115)/(30-90) 88/59 (04/16 0830) SpO2:  [90 %-100 %] 94 % (04/16 0843) FiO2 (%):  [100 %] 100 % (04/16 0805) Last BM Date: 01/23/2018  Weight change: Filed Weights   01/16/2018 1451 01/12/18 0446  Weight: 191 lb 12.8 oz (87 kg) 193 lb 5.5 oz (87.7 kg)    Intake/Output:   Intake/Output Summary (Last 24 hours) at 2018-02-02 1014 Last data filed at 01/12/2018 1800 Gross per 24 hour  Intake 125 ml  Output 210 ml  Net -85 ml      Physical Exam    General:  Intubate HEENT: ETT Neck: Supple. JVP hard to assess. Carotids 2+ bilat; no bruits. No lymphadenopathy or thyromegaly appreciated. Cor: PMI nondisplaced. Irregular rate & rhythm. No rubs, gallops or murmurs. Lungs: Clear Abdomen: Soft, nontender, nondistended. No hepatosplenomegaly. No bruits or masses. Good bowel sounds. Extremities: No cyanosis, clubbing, rash, edema Neuro: sedated    Telemetry   A fib 100s   EKG    N/A   Labs    CBC Recent Labs    01/12/2018 0936 01/12/18 0556 02/02/18 0724  WBC 12.3* 18.1* 29.9*  NEUTROABS 10.3*  --  26.9*  HGB 10.6* 11.0* 10.1*  HCT 36.4 38.3 36.3  MCV 90.8 94.6 97.1  PLT 494* 557* 494*   Basic Metabolic Panel Recent Labs    01/12/18 0556 02/02/18 0724  NA 139 136  K 4.4 5.6*  CL  93* 92*  CO2 28 29  GLUCOSE 129* 173*  BUN 49* 60*  CREATININE 1.62* 2.25*  CALCIUM 8.0* 7.5*  PHOS  --  7.5*   Liver Function Tests Recent Labs    01/10/2018 0936 02-02-2018 0724  AST 88*  --   ALT 53  --   ALKPHOS 206*  --   BILITOT 1.2  --   PROT 7.1  --   ALBUMIN 2.9* 2.6*   No results for input(s): LIPASE, AMYLASE in the last 72 hours. Cardiac Enzymes Recent Labs    01/17/2018 2053 12/29/2017 2339 02-02-18 0724  TROPONINI 0.09* 0.08* 0.10*    BNP: BNP (last 3 results) Recent Labs    12/25/17 1737 01/10/2018 0936  BNP 2,089.7* 868.2*    ProBNP (last 3 results) No results for input(s): PROBNP in the last 8760 hours.   D-Dimer No results for input(s): DDIMER in the last 72 hours. Hemoglobin A1C Recent Labs    01/03/2018 0936  HGBA1C 5.7*   Fasting Lipid Panel No results for input(s): CHOL, HDL, LDLCALC, TRIG, CHOLHDL, LDLDIRECT in the last 72 hours. Thyroid Function Tests No results for input(s): TSH, T4TOTAL, T3FREE, THYROIDAB in the last 72 hours.  Invalid input(s): FREET3  Other results:   Imaging    Dg Chest Port 1 View  Result Date: 02-02-18  CLINICAL DATA:  Respiratory failure. EXAM: PORTABLE CHEST 1 VIEW COMPARISON:  Radiograph of January 11, 2018. FINDINGS: Stable cardiomegaly. Endotracheal tube is seen projected over tracheal air shadow with distal tip 4 cm above the carina. Mild central pulmonary vascular congestion is noted. Left-sided pacemaker is unchanged in position. Right lung is clear. Left basilar atelectasis or infiltrate is noted with associated pleural effusion. Bony thorax is unremarkable. IMPRESSION: Endotracheal tube in grossly good position. Stable cardiomegaly with central pulmonary vascular congestion. Left basilar atelectasis or infiltrate is noted with associated pleural effusion. Electronically Signed   By: Marijo Conception, M.D.   On: 2018/01/20 08:25      Medications:     Scheduled Medications: . fentaNYL (SUBLIMAZE)  injection  50 mcg Intravenous Once  . folic acid  1 mg Oral Daily  . insulin aspart  0-9 Units Subcutaneous TID WC  . insulin glargine  5 Units Subcutaneous QAC breakfast  . levothyroxine  50 mcg Intravenous Daily  . LORazepam      . LORazepam  0.5 mg Intravenous STAT  . mouth rinse  15 mL Mouth Rinse BID  . methylPREDNISolone (SOLU-MEDROL) injection  40 mg Intravenous Daily  . mometasone-formoterol  2 puff Inhalation BID  . naloxone      . nystatin   Topical TID  . sulfaSALAzine  1,000 mg Oral BID     Infusions: . azithromycin Stopped (01/12/18 1505)  . dexmedetomidine (PRECEDEX) IV infusion 0.4 mcg/kg/hr (2018-01-20 0944)  . fentaNYL infusion INTRAVENOUS 100 mcg/hr (01-20-18 0848)  . piperacillin-tazobactam (ZOSYN)  IV    . vancomycin 1,000 mg (Jan 20, 2018 1012)     PRN Medications:  acetaminophen **OR** acetaminophen, albuterol, bisacodyl, fentaNYL, HYDROcodone-acetaminophen, ipratropium-albuterol, ondansetron **OR** ondansetron (ZOFRAN) IV, senna-docusate    Patient Profile   Mrs Mcmahen is a 77 year old with a history of combined systolic/disastolic hf, HTN, DM, hyperlipidemia, hypothyroidism, tachy/brady s/p PPM, COPD, GERD, A fib, CAD, and PAH.   Admitted with increase dyspnea. Had not been taking HF meds.      Assessment/Plan   1. Acute Respiratory Failure in the setting of hypercarbia Intubated this morning.  Getting CT of head.   2. A/C Systolic/Diastolic HF ECHO 4/2 1610 EF 55-60%mild MR, severe biatrial enlargement, RV mod dilated, mod TR, PA peak pressure: 58 (Previously EF 40-45% on Echo 03/2017) Current off diuretics with hypotension.   3. Shock Blood and urine cultures pending.   4. AH/RV Failure Off PAH meds with hypotension.  5. AECOPD  Placed on vanc/zosyn this morning.  Steroids per CCMrimary team.   6 Tachyt/Brady Medtronic PPM   7. Permanent A fib Rate controlled.  On eliquis.    8. CAD Cath 2013 with LAD 70-75% and ostial 95%  DS No s/s ischemia On statin and eliquis.   9. DMII per primary team   10  Hyperkalemia Stop K  11. AKI on CKD stage III Creatinine trending up 1.6>2.25 Diuretics held.   Likely transition to comfort care once daughter returns with advanced directive. Per son-in-law -- she is DNR/DNI. Palliative Care consult placed.   Length of Stay: 2  Darrick Grinder, NP  2018-01-20, 10:14 AM  Advanced Heart Failure Team Pager [phone] (M-F; 7a - 4p)  Please contact [facility] for night-coverage after hours (4p -7a ) and weekends on [email or web address]  Agree with above  Patient developed acute on chronic respiratory failure this am with severe hypercarbia and hypotension. Intubated and moved to ICU. On my arrival on 90% FiO2 with sats  in low 80s. SBP 90s on norepi 7. WBC elevated at 30k.   On exam  Intubated awake at times JVP 8-9 Cor tachy irreg Lungs mechanical breath sounds Ab soft NT Ext pale cool trace LE edema  She is critically ill with profound respiratory failure and likely sepsis. She has struggled through her recent hospitalization and said she wanted to die. I had long talk with her daughter and son-in-law her confirmed that she indeed has had a very poor QOL of the past few weeks and would never want to live like this or be kept alive by machines. The decision was made to proceed with comfort care. I discussed with Dr. Lamonte Sakai who agreed. She was started on morphine and versed and titrated to comfort. I then performed a terminal extubation. She passed quietly about 30 minutes with her family at her bedside. Despite being apneic for 10 mins with no audible heart or breath sounds her pacemaker kept pacing. I asked the MD[employer] rep to come and deactivate her pacemaker which demonstrated asystole underneath. I pronounced her dead at 1245p.   CRITICAL CARE Performed by: Glori Bickers  Total critical care time: 90 minutes  Critical care time was exclusive of separately billable procedures and  treating other patients.  Critical care was necessary to treat or prevent imminent or life-threatening deterioration.  Critical care was time spent personally by me (independent of midlevel providers or residents) on the following activities: development of treatment plan with patient and/or surrogate as well as nursing, discussions with consultants, evaluation of patient's response to treatment, examination of patient, obtaining history from patient or surrogate, ordering and performing treatments and interventions, ordering and review of laboratory studies, ordering and review of radiographic studies, pulse oximetry and re-evaluation of patient's condition.  Glori Bickers, MD  1:03 PM

## 2018-01-28 NOTE — Consult Note (Addendum)
PULMONARY / CRITICAL CARE MEDICINE   Name: [name] MRN: [id number] DOB: 1941/08/28    ADMISSION DATE:  01/03/2018 CONSULTATION DATE: 02/12/18  REFERRING MD: [facility]  CHIEF COMPLAINT: Hypercarbic respiratory failure  HISTORY OF PRESENT ILLNESS:   [name] is a 77 year old female who was recently discharged from [facility] to a rehab facility for which she left without completing rehabilitation due to her discomfort with rehab facility.  She went home was noncompliant with her medications and presented to [facility] 12/30/2017 with shortness of breath.  She has congestive heart failure for which she is on rest of diuresis, she has obstructive sleep apnea chronic objective pulmonary disease for which she is noncompliant with CPAP and is on prednisone taper for which she had not been taking in the home setting.  After admission to [facility] she was treated with aggressive diuresis rising creatinine and was found unresponsive 2018/02/12 in early a.m.  She was urgently intubated by anesthesia.  Transferred to [facility] medical intensive care unit and pulmonary critical care was asked to assume her care.  She has extensive past medical history which is well documented.  She will need a CT of the head to rule out intracerebral hemorrhage she is on chronic Eliquis for atrial fibrillation.  Her CO2 was noted to be 100 on her first arterial blood gas.  Follow-up blood gases and chest x-rays have been ordered. PAST MEDICAL HISTORY :  She  has a past medical history of Atrial fibrillation ([place]), Atrial fibrillation ([place]) (12/18/2007), CAD (coronary artery disease), CHF (congestive heart failure) (HCC), Chronic diastolic heart failure ([place]), Chronic obstructive pulmonary disease ([place]), COPD (chronic obstructive pulmonary disease) ([place]), Coronary artery disease (06/15/2012), Crohn's disease ([place]), Depression, Diabetes mellitus ([place]) (06/22/2012), Diabetes mellitus without complication  ([place]), Dysrhythmia, GERD (gastroesophageal reflux disease), Gout, Hyperlipidemia, Hypothyroidism, Morbid obesity ([place]), Morbid obesity with BMI of 40.0-44.9, adult ([place]) (06/22/2012), Nonischemic cardiomyopathy ([place]), Obstructive sleep apnea, On home oxygen therapy, Osteoporosis, Pacemaker, Peripheral vascular disease ([place]) (15), Seasonal allergies, Shortness of breath dyspnea, and Tachy-brady syndrome ([place]).  PAST SURGICAL HISTORY: She  has a past surgical history that includes pulmonary vein isolation (05/12/2008); post ablation pseudoaneurysm; Pacemaker insertion (2010); Tonsillectomy; Embolectomy (Right, 08/16/2014); Cardiac catheterization (08/2000); Cardiac catheterization (05/29/2007); Cataract extraction w/ intraocular lens  implant, bilateral (Bilateral, 2014-2016); Parathyroidectomy (Right, 01/30/2015); Parathyroidectomy (Left, 10/03/2015); Parathyroidectomy (Left, 10/03/2015); Cardiac catheterization (N/A, 01/09/2016); and Cardiac catheterization (N/A, 07/15/2016).  No Known Allergies  No current facility-administered medications on file prior to encounter.    Current Outpatient Medications on File Prior to Encounter  Medication Sig  . acetaminophen (TYLENOL) 325 MG tablet Take 2 tablets (650 mg total) by mouth every 6 (six) hours as needed for mild pain, fever or headache.  . ADVAIR DISKUS 500-50 MCG/DOSE AEPB Inhale 1 puff into the lungs every 12 (twelve) hours.  Marland Kitchen albuterol (PROVENTIL HFA;VENTOLIN HFA) 108 (90 BASE) MCG/ACT inhaler Inhale 2 puffs into the lungs every 6 (six) hours as needed for wheezing or shortness of breath.  Marland Kitchen atorvastatin (LIPITOR) 80 MG tablet Take 80 mg by mouth at bedtime.    . colchicine 0.6 MG tablet Take 0.6-1.2 mg by mouth daily as needed (for gout flares). Reported on 03/03/6802  . folic acid (FOLVITE) 1 MG tablet Take 1 mg by mouth daily.  Marland Kitchen LANTUS 100 UNIT/ML injection Inject 0.05 mLs (5 Units total) into the skin daily before breakfast. (Patient taking differently:  Inject 10 Units into the skin daily before breakfast. )  .  levothyroxine (SYNTHROID, LEVOTHROID) 100 MCG tablet Take 100 mcg by mouth daily.  . macitentan (OPSUMIT) 10 MG tablet Take 1 tablet (10 mg total) by mouth daily.  . Multiple Vitamins-Minerals (MULTIVITAMIN WITH MINERALS) tablet Take 1 tablet by mouth daily.  . Multiple Vitamins-Minerals (PRESERVISION AREDS 2) CAPS Take 1 capsule by mouth 2 (two) times daily.  . nitroGLYCERIN (NITROSTAT) 0.4 MG SL tablet Place 1 tablet (0.4 mg total) under the tongue every 5 (five) minutes as needed for chest pain.  . potassium chloride SA (K-DUR,KLOR-CON) 20 MEQ tablet Take 20 mEq by mouth 2 (two) times daily.  . ranolazine (RANEXA) 500 MG 12 hr tablet Take 500 mg by mouth 2 (two) times daily.  . sildenafil (REVATIO) 20 MG tablet Take 2 tablets (40 mg total) by mouth 3 (three) times daily.  Marland Kitchen spironolactone (ALDACTONE) 25 MG tablet Take 0.5 tablets (12.5 mg total) by mouth daily.  Marland Kitchen sulfaSALAzine (AZULFIDINE) 500 MG tablet Take 1,000 mg by mouth 2 (two) times daily.  . Tiotropium Bromide Monohydrate (SPIRIVA RESPIMAT) 2.5 MCG/ACT AERS INHALE 2 PUFFS INTO THE LUNGS DAILY.  Marland Kitchen torsemide (DEMADEX) 20 MG tablet Take 1 tablet (20 mg total) by mouth 2 (two) times daily.  Marland Kitchen ELIQUIS 5 MG TABS tablet TAKE 1 TABLET BY MOUTH TWICE A DAY  . metoprolol tartrate (LOPRESSOR) 25 MG tablet Take 1 tablet (25 mg total) by mouth 2 (two) times daily.  . predniSONE (DELTASONE) 20 MG tablet Take 2 tablets (40 mg total) by mouth daily with breakfast.  . [DISCONTINUED] allopurinol (ZYLOPRIM) 100 MG tablet Take 100 mg by mouth daily.      FAMILY HISTORY:  Her indicated that her mother is deceased. She indicated that her father is deceased.   SOCIAL HISTORY: She  reports that she quit smoking about 31 years ago. She has a 96.00 pack-year smoking history. She quit smokeless tobacco use about 30 years ago. She reports that she does not drink alcohol or use drugs.  REVIEW OF  SYSTEMS:   Not available  SUBJECTIVE:  Elderly female sedated on vent  VITAL SIGNS: BP 93/60   Pulse (!) 134   Temp 97.9 F (36.6 C) (Oral)   Resp (!) 25   Ht 5\' 11"  (1.803 m)   Wt 87.7 kg (193 lb 5.5 oz)   SpO2 99%   BMI 26.97 kg/m   HEMODYNAMICS:    VENTILATOR SETTINGS: Vent Mode: PRVC FiO2 (%):  [100 %] 100 % Set Rate:  [20 bmp] 20 bmp Vt Set:  [500 mL] 500 mL PEEP:  [5 cmH20] 5 cmH20 Plateau Pressure:  [28 cmH20] 28 cmH20  INTAKE / OUTPUT: I/O last 3 completed shifts: In: 43 [P.O.:560; IV Piggyback:125] Out: 910 [Urine:910]  PHYSICAL EXAMINATION: General: Elderly female currently on full mechanical dilatory support Neuro: Moves all extremities x4 peripherally HEENT: Pupils equal reactive light, no lymphadenopathy  is appreciated Cardiovascular: Heart sounds are irregular Lungs: Coarse rhonchi bilaterally Abdomen: Obese Musculoskeletal: Intact Skin: Old scab noted on left knee  LABS:  BMET Recent Labs  Lab 01/08/18 0442 01/22/2018 0936 01/12/18 0556  NA 133* 139 139  K 3.4* 3.7 4.4  CL 86* 92* 93*  CO2 36* 33* 28  BUN 37* 47* 49*  CREATININE 1.09* 1.45* 1.62*  GLUCOSE 97 141* 129*    Electrolytes Recent Labs  Lab 01/08/18 0442 01/17/2018 0936 01/12/18 0556  CALCIUM 8.0* 8.0* 8.0*    CBC Recent Labs  Lab 01/23/2018 0936 01/12/18 0556 2018-01-28 0724  WBC 12.3* 18.1*  29.9*  HGB 10.6* 11.0* 10.1*  HCT 36.4 38.3 36.3  PLT 494* 557* 583*    Coag's No results for input(s): APTT, INR in the last 168 hours.  Sepsis Markers Recent Labs  Lab 01/21/2018 1017  LATICACIDVEN 1.90    ABG Recent Labs  Lab 01-23-2018 0712  PHART 7.116*  PCO2ART 109*  PO2ART 56.1*    Liver Enzymes Recent Labs  Lab 01/07/2018 0936  AST 88*  ALT 53  ALKPHOS 206*  BILITOT 1.2  ALBUMIN 2.9*    Cardiac Enzymes Recent Labs  Lab 01/26/2018 1652 01/19/2018 2053 01/27/2018 2339  TROPONINI 0.09* 0.09* 0.08*    Glucose Recent Labs  Lab 01/12/18 0759  01/12/18 1228 01/12/18 1551 01/12/18 2125 23-Jan-2018 0503 01-23-18 0715  GLUCAP 128* 108* 192* 167* 162* 165*    Imaging Dg Chest Port 1 View  Result Date: 23-Jan-2018 CLINICAL DATA:  Respiratory failure. EXAM: PORTABLE CHEST 1 VIEW COMPARISON:  Radiograph of January 11, 2018. FINDINGS: Stable cardiomegaly. Endotracheal tube is seen projected over tracheal air shadow with distal tip 4 cm above the carina. Mild central pulmonary vascular congestion is noted. Left-sided pacemaker is unchanged in position. Right lung is clear. Left basilar atelectasis or infiltrate is noted with associated pleural effusion. Bony thorax is unremarkable. IMPRESSION: Endotracheal tube in grossly good position. Stable cardiomegaly with central pulmonary vascular congestion. Left basilar atelectasis or infiltrate is noted with associated pleural effusion. Electronically Signed   By: Marijo Conception, M.D.   On: 01-23-18 08:25     STUDIES:  01-23-18 CT of the head plan>>  CULTURES: 01-23-2018 sputum>> January 23, 2018 blood cultures x2>> Jan 23, 2018 urine culture>>  ANTIBIOTICS: 01/09/2018 Zithromax>> 2018/01/23 vancomycin>> 01/23/2018 Zosyn>> 23-Jan-2018 nystatin>>   SIGNIFICANT EVENTS: 01/23/2018 respiratory arrest in the setting of hypercarbia  LINES/TUBES: 01-23-18 intubation by anesthesia>>  DISCUSSION: Mrs. Mcclane is a 77 year old female who was recently discharged from [facility] to a rehab facility for which she left without completing rehabilitation due to her discomfort with rehab facility.  She went home was noncompliant with her medications and presented to [facility] 01/15/2018 with shortness of breath.  She has congestive heart failure for which she is on rest of diuresis, she has obstructive sleep apnea chronic objective pulmonary disease for which she is noncompliant with CPAP and is on prednisone taper for which she had not been taking in the home setting.  After admission to [facility] she was treated with aggressive diuresis rising creatinine and was found unresponsive 23-Jan-2018 in early a.m.  She was urgently intubated by anesthesia.  Transferred to [facility] medical intensive care unit and pulmonary critical care was asked to assume her care.  She has extensive past medical history which is well documented.  She will need a CT of the head to rule out intracerebral hemorrhage she is on chronic Eliquis for atrial fibrillation.  Her CO2 was noted to be 100 on her first arterial blood gas.  Follow-up blood gases and chest x-rays have been ordered. ASSESSMENT / PLAN:  PULMONARY A: Acute respiratory failure in the setting of hypercarbia Chronically stuffed pulmonary disease former smoker quit 29 years ago Pulmonary artery hypertension on her body OSA noncompliant and has not been on CPAP while in the hospital.  P:   Vent dependent respiratory failure with vent bundle  follow-up ABGs to monitor for hypercarbia Chest x-ray shows endotracheal tube in proper position and findings of congestive heart failure Change prednisone to Solu-Medrol CPAP once extubated  CARDIOVASCULAR A:  Chronic atrial fibrillation Chronic congestive heart failure Status post respiratory arrest 2018-01-30 P:  Hold diuresis for now Fluid resuscitation Twelve-lead EKG Cardiology consult  RENAL Lab Results  Component Value Date   CREATININE 1.62 (H) 01/12/2018   CREATININE 1.45 (H) 12/30/2017   CREATININE 1.09 (H) 01/08/2018   CREATININE 1.48 (H) 02/14/2016   Recent Labs  Lab 01/08/18 0442 01/08/2018 0936 01/12/18 0556  K 3.4* 3.7 4.4    A:   Chronic renal insufficiency P:   Follow creatinine Hold diuresis for now in the setting of a respiratory arrest  GASTROINTESTINAL A:   GI protection P:   Place NG tube for decompression following code with swallowed air N.p.o. 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  - Inter-disciplinary family meet or Palliative Care meeting due by:  day 7  App CCT 60 min  Richardson Landry Minor ACNP Maryanna Shape PCCM Pager [phone] till 1 pm If no answer page [phone] Jan 30, 2018, 8:39 AM  Attending Note:  77 year old female with extensive cardiac history who presents to the ICU after hypercarbic and hypoxemic respiratory failure that developed hypotension as was subsequently intubated and sent to the ICU.  On exam, patient is waking up with coarse BS diffusely.  I reviewed CXR myself, ETT is in good position, left sided pleural effusion and pulmonary edema noted.  Will repeat ABG now.  Adjust vent for ABG.  No need for pressors at this point.  Hold further diureses at this time.  Hold eliquis and obtain head CT and tox screen now.  Will continue full support for  now.  Family updated by medicine team.  HCAP coverage.  Pan culture.  Nystatin powder to groin.  F/U on cultures.  No need for septic shock intervention at this time.  The patient is critically ill with multiple organ systems failure and requires high complexity decision making for assessment and support, frequent evaluation and titration of therapies, application of advanced monitoring technologies and extensive interpretation of multiple databases.   Critical Care Time devoted to patient care services described in this note is  45  Minutes. This time reflects time of care of this signee Dr Jennet Maduro. This critical care time does not reflect procedure time, or teaching time or supervisory time  of PA/NP/Med student/Med Resident etc but could involve care discussion time.  Rush Farmer, M.D. [facility] Pulmonary/Critical Care Medicine. Pager: [phone]. After hours pager: [phone].

## 2018-01-28 NOTE — Progress Notes (Signed)
Patient's SBP decreased to 60 Dr Lamonte Sakai informed and levophed started per orders.Family at bedside

## 2018-01-28 NOTE — Progress Notes (Signed)
Patient, [name], will be transferred to room 2M02, s/p intubation.  Contact person listed in patient chart, Eugenie Birks, called and informed of patient's status change and transfer to ICU.  Eugenie Birks confirmed he is son-in-law of [name] and he will update other family member(s).  He was informed [name]'s b/p was low this morning when nurse went in to give medication and that patient was hard to arouse.  Rapid Response called and patient status progressed to where she requuired intubation.  ICU room assignment is 2M02.  Patient's family member was informed of all above.

## 2018-01-28 NOTE — Progress Notes (Signed)
Pharmacy Antibiotic Note  [name] is a 77 y.o. female admitted on 01/12/2018 with respiratory failure requiring intubation.  Pharmacy has been consulted for vancomycin and Zosyn dosing for HCAP/sepsis.  Plan: -Zosyn 3.375g IV q8h EI -Vancomycin 1000mg  IV q24h -Monitor renal funx, cultures, LOT -Obtain vancomycin level as indicated  Height: 5\' 11"  (180.3 cm) Weight: 193 lb 5.5 oz (87.7 kg) IBW/kg (Calculated) : 70.8  Temp (24hrs), Avg:97.9 F (36.6 C), Min:97.5 F (36.4 C), Max:98.1 F (36.7 C)  Recent Labs  Lab 01/07/18 0428 01/08/18 0442 01/24/2018 0936 01/22/2018 1017 01/12/18 0556 01/31/18 0724  WBC 9.0 12.4* 12.3*  --  18.1* 29.9*  CREATININE 1.25* 1.09* 1.45*  --  1.62*  --   LATICACIDVEN  --   --   --  1.90  --   --     Estimated Creatinine Clearance: 35.6 mL/min (A) (by C-G formula based on SCr of 1.62 mg/dL (H)).    No Known Allergies  Antimicrobials this admission: 4/14 Azithromycin >>  4/16 Zosyn >>  4/16 Vancomycin >>  Microbiology results: 4/15 UCx: negative   Thank you for allowing pharmacy to be a part of this patient's care.   Arrie Senate, PharmD, BCPS PGY-2 Cardiology Pharmacy Resident Pager: [phone] 01-31-18

## 2018-01-28 NOTE — Procedures (Signed)
Extubation Procedure Note  Patient Details:   Name: [name] DOB: 08-20-1941 MRN: [id number]   Airway Documentation:  Airway 7.5 mm (Active)  Secured at (cm) 23 cm Jan 28, 2018 11:08 AM  Measured From Lips Jan 28, 2018 11:08 AM  Secured Location Left 01-28-18 11:08 AM  Secured By [employer] 2018-01-28 11:08 AM  Tube Holder Repositioned Yes 01-28-18 11:08 AM  Cuff Pressure (cm H2O) 28 cm H2O 01-28-2018  8:05 AM  Site Condition Dry 28-Jan-2018  8:05 AM    Evaluation  O2 sats: currently acceptable Complications: Complications of terminal extubation Patient did tolerate procedure well. Bilateral Breath Sounds: Clear   Yes  Dimple Nanas 01/28/18, 12:24 PM

## 2018-01-28 NOTE — Progress Notes (Signed)
Patient had less than 45ml since 2200 last night. Patient was bladder scanned and showed 75ml. Patient BP was soft 93/50 but other vitals are stable. MD had been notified, no order has been placed.   Drue Flirt, RN

## 2018-01-28 NOTE — Progress Notes (Addendum)
Called by night RN regarding this patient  D/w Rapid response at bedside-hypotension, tachy [afib], lethargic, RUE teitchy Admit with AECHF in setting noncompliance-leaving SNF.  Lethargic on arrival Pupils 5, some twitching RUE Not able to foll commands s1 s 2 tachy weak thready pulse abd soft nt Extr mottled and cold cta poor exam  A/p ACLS protocols undertaken--see separate charting Anesthesia intubated--ABG 7.11/109/56 Await Troponin, Bmet, P CXR, CT head No family bedside--will call when able  D/w CCM team on ICU who have graciously assumed care  1 hour direct critical care time with high complexity MDM and high risk for hemodyn compromise and or /death   Verneita Griffes, MD [facility] [phone]

## 2018-01-28 NOTE — Progress Notes (Signed)
[place]  This encounter was created in error - please disregard.

## 2018-01-28 NOTE — Accreditation Note (Signed)
Restraints not reported to CMS Pursuant to regulation 482.13(G) (3) use of soft wrist restraints was logged 01/27/2018

## 2018-01-28 NOTE — Progress Notes (Signed)
Patient terminally extubated at 12:24. MD and Family in room. Chaplain called per family request. Patient appeared comfortable at time of death. Time of death noted at 12:45. Verified by 2 RN's Cristie Hem and Dorthula Matas), and MD DR. Bensimhon. Pupils fixed and dilated, no audible heart tones, no respirations noted.   Belongings including clothing sent home with family. Bed placement card provided to family.

## 2018-01-28 NOTE — Progress Notes (Signed)
PT Cancellation Note  Patient Details Name: [name] MRN: [id number] DOB: [birth year]   Cancelled Treatment:    Reason Eval/Treat Not Completed: Patient not medically ready. Pt unresponsive. Rapid response in room. PT to return as able, as appropriate.  Kittie Plater, PT, DPT Pager #: [phone] Office #: [phone]    [place] 19-Jan-2018, 7:21 AM

## 2018-01-28 NOTE — Progress Notes (Signed)
Morphine 85 mls wasted and versed 45 mls wasted and witnessed with Deretha Emory

## 2018-01-28 NOTE — Discharge Summary (Signed)
  Advanced Heart Failure Death Summary  Death Summary   Patient ID: [name] MRN: [id number], DOB/AGE: Oct 04, 1940 77 y.o. Admit date: 01/15/2018 D/C date:     01-24-18   Primary Discharge Diagnoses:  1. Septic shock due to urosepsis 2. Acute on chronic hypoxemic/hypercarbic respiratory failure 3. Pulmonary HTN 4. Chronic atrial fibrillation  5. Acute on chronic systolic heart failure 6. AKI  7. Chronic cor pulmonale 8. DM2 9. COPD  Hospital Course:   [name] was a 77 year old with a history of combined systolic/disastolic HF HTN, DM, hyperlipidemia, hypothyroidism, tachy/brady s/p PPM, COPD, GERD, A fib, CAD, and PAH.   She was admitted to [facility] from 3/28 through 01/08/2018 with increased dyspnea felt to be multifactorial from ADHF, PAH and acute URI . Diuresed with lasix and transitioned to torsemide 20 mg twice a day. Treated for acute bronchitis. Respiratory panel + for rhinovirus. Placed on steroid taper. Discharged to [facility] 01/08/2018.   She felt horrible at SNF and signed out AMA without getting any of her medications.   Admitted to [facility] 4/14 with increased cough and dyspnea felt to be a combination of HF and COPD though weight not markedly elevated from baseline. Started on IV diuretics and treated for COPD flare. On the morning of 4/16 became unresponsive and had AKI. She was urgently intubated by anesthesia.  Transferred to MICU. Head CT negative. Initial ABG after intubation 7.11/109/56/78%. WBC found to be 30K. Started on broad spectrum abx. Patient became hypotensive and placed on vasopressors felt to have septic shock.  After long discussion with her family, it was decided that patient would not want to be on life support given her baseline declining QOL and she had previously discussed being DNR. Patient was then placed on comfort drips and was terminally extubated and her pacemaker was deactivated. She died shortly thereafter with me and her family at her bedside.     Glori Bickers, MD  11:34 PM

## 2018-01-28 NOTE — Significant Event (Addendum)
Rapid Response Event Note  Overview: Neurologic (Decreased LOC)  Initial Focused Assessment: Received a call from bedside RN at 0700 about patient being very lethargic and hypoxic. I asked the RNs to page the MD on call and informed RN that I was on my way. Patient was placed on NRB 15L when I arrived. Patient was obtunded, occassionally moans. SBP was in the 60-70s and sats were 90-93% on 15L NRB. HR 100-120 AF. Lung sounds very diminished and poor air movement overall.  + pulses, skin was mottled, and blue fingertips.  Pupils 4 mm, sluggish, + CR. Twitching in RUE noticed as well ( right side of the patient's neck and down her RUE). I was able to update and receive orders from daytime TRH MD.  Interventions: -- NS bolus started immediately, patient received 500cc- SBP did improve into the 90s -- Narcan 0.4mg  x 1 -- Ativan 0.5mg  x 1 -- STAT CXR, ABG, BMP, CBC, and Troponin ordered -- STAT blood sugar was checked - normal -- Levophed drip mixed and on standby should it be needed. -- CCM consulted   MD came to bedside as ABG was read, critical ABG, decision made to intubate patient.  Patient was bagged via BVM and a Code Blue was called to facilitate with emergency intubation.  Anesthesia team arrived, patient became hypotensive again SBP in the 60s, + pulses (weak but present), Zoll defibrillator applied to confirm HR and Anesthesia team administered fluids,ephedrine, and phenylephrine. SBP improved to 90s and MAPs in the 60s. Patient was intubated by anesthesia and transferred to 2M02.   Plan of Care: -- Transferred to 2M02  Event Summary:   at    Call Time 0700 Arrival Time 0702 End Time 2015  Brylin Stanislawski R

## 2018-01-28 NOTE — Transfer of Care (Signed)
Immediate Anesthesia Transfer of Care Note  Patient: [name]  Procedure(s) Performed: AN AD [place]  Patient Location: ICU  Anesthesia Type:General  Level of Consciousness: Patient remains intubated per anesthesia plan - respiratory code  Airway & Oxygen Therapy: Patient remains intubated per anesthesia plan and Patient placed on Ventilator (see vital sign flow sheet for setting)  Post-op Assessment: Report given to RN  Post vital signs: Reviewed and stable after recussitation, patient sbp 90s  Last Vitals:  Vitals Value Taken Time  BP    Temp    Pulse 134 2018-01-23  8:05 AM  Resp 16 23-Jan-2018  8:07 AM  SpO2 99 % 01/23/18  8:05 AM  Vitals shown include unvalidated device data.  Last Pain:  Vitals:   Jan 23, 2018 0446  TempSrc: Oral  PainSc:          Complications: No apparent anesthesia complications

## 2018-01-28 NOTE — Progress Notes (Signed)
Fentanyl 245 mls wasted and witnessed with Oneita Jolly

## 2018-01-28 NOTE — Progress Notes (Signed)
Patient had an acute change in status. She was lethargic, hypoxic and hypotension upon arrival to the room. Rapid response and the MD was paged. The patient was transferred to the ICU. Family was notified.  Drue Flirt, RN

## 2018-01-28 DEATH — deceased

## 2018-02-12 ENCOUNTER — Encounter: Payer: Self-pay | Admitting: Internal Medicine

## 2018-12-27 IMAGING — DX DG CHEST 1V PORT
1 series · 1 of 1 positions shown · non-contrast
Comparison: Portable exam 3170 hours compared to 01/03/2018

CLINICAL DATA: Increasing shortness of breath and lower extremity
edema/swelling for 3 days after rehab, history CHF, coronary artery
disease, non ischemic cardiomyopathy, diabetes mellitus, and COPD,
Crohn's disease, atrial fibrillation

EXAM:
PORTABLE CHEST 1 VIEW

[chest ap]
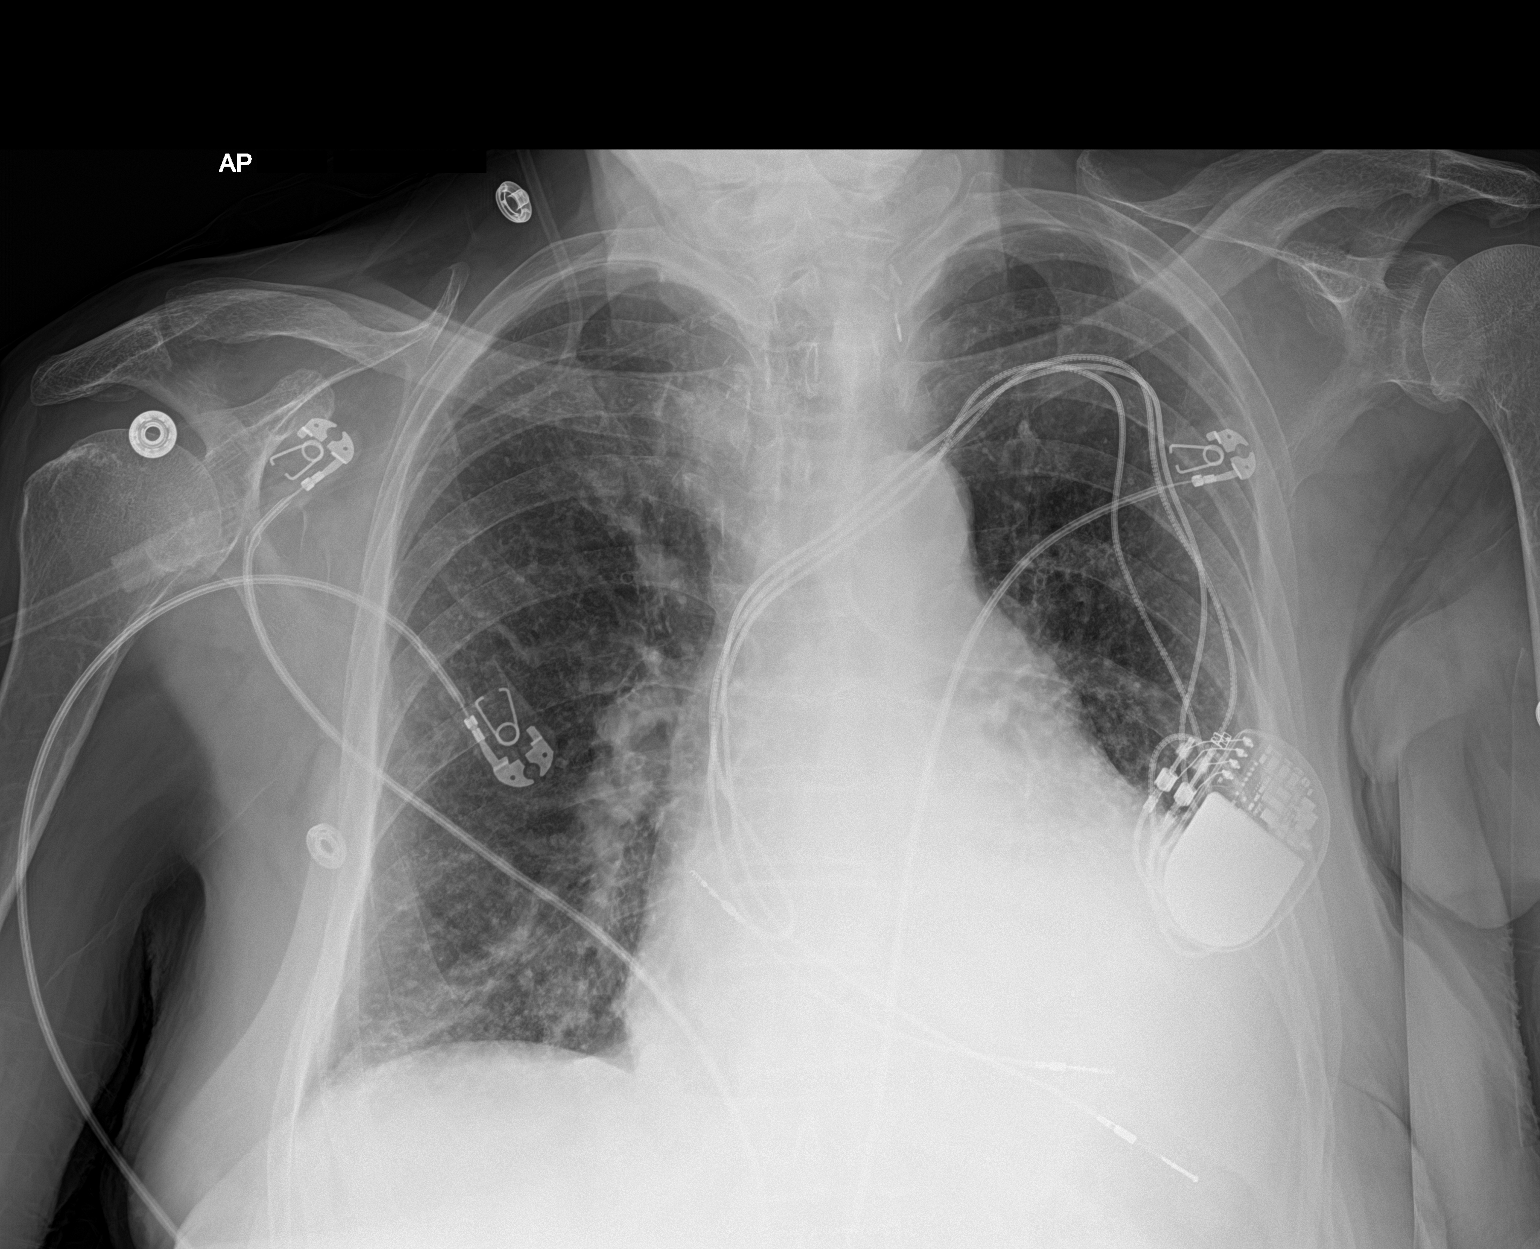

[1 of 1 positions shown; findings below may reference images not displayed]

FINDINGS: LEFT subclavian transvenous pacemaker with leads projecting over
RIGHT atrium and RIGHT ventricle unchanged.

Enlargement of cardiac silhouette with pulmonary vascular
congestion.

Mediastinal contours normal.

Emphysematous and bronchitic changes compatible with COPD.

Atelectasis versus consolidation in LEFT lower lobe.

Mild RIGHT basilar atelectasis.

Upper lungs clear.

Question small LEFT pleural effusion.

No pneumothorax.

Bones demineralized.
IMPRESSION: Enlargement of cardiac silhouette pulmonary vascular congestion.

COPD changes with mild RIGHT basilar atelectasis.

Atelectasis versus consolidation LEFT lower lobe with question small
LEFT pleural effusion.

## 2018-12-29 IMAGING — DX DG CHEST 1V PORT
1 series · 2 of 2 positions shown · non-contrast
Comparison: Radiograph January 11, 2018.

CLINICAL DATA: Respiratory failure.

EXAM:
PORTABLE CHEST 1 VIEW

[Series 1: chest ap · 0.14mm/px · 2 of 2 slices shown]
[im 1/2]
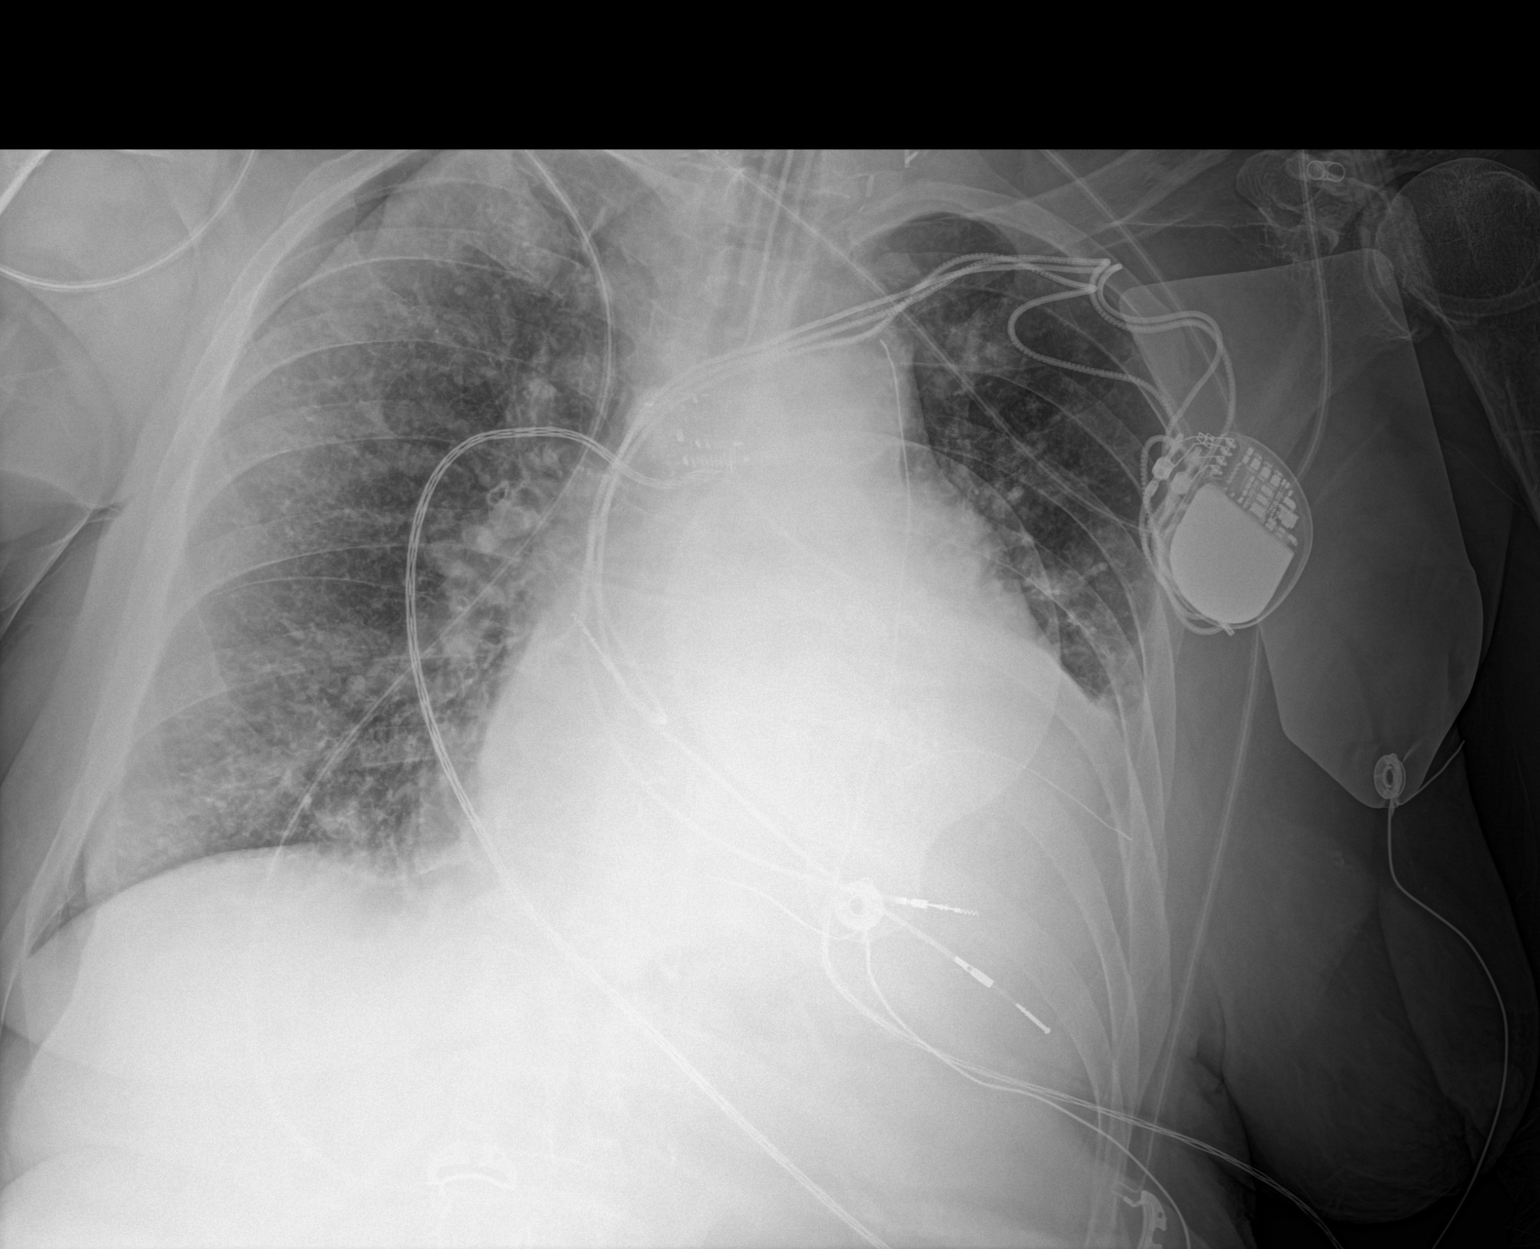
[im 2/2]
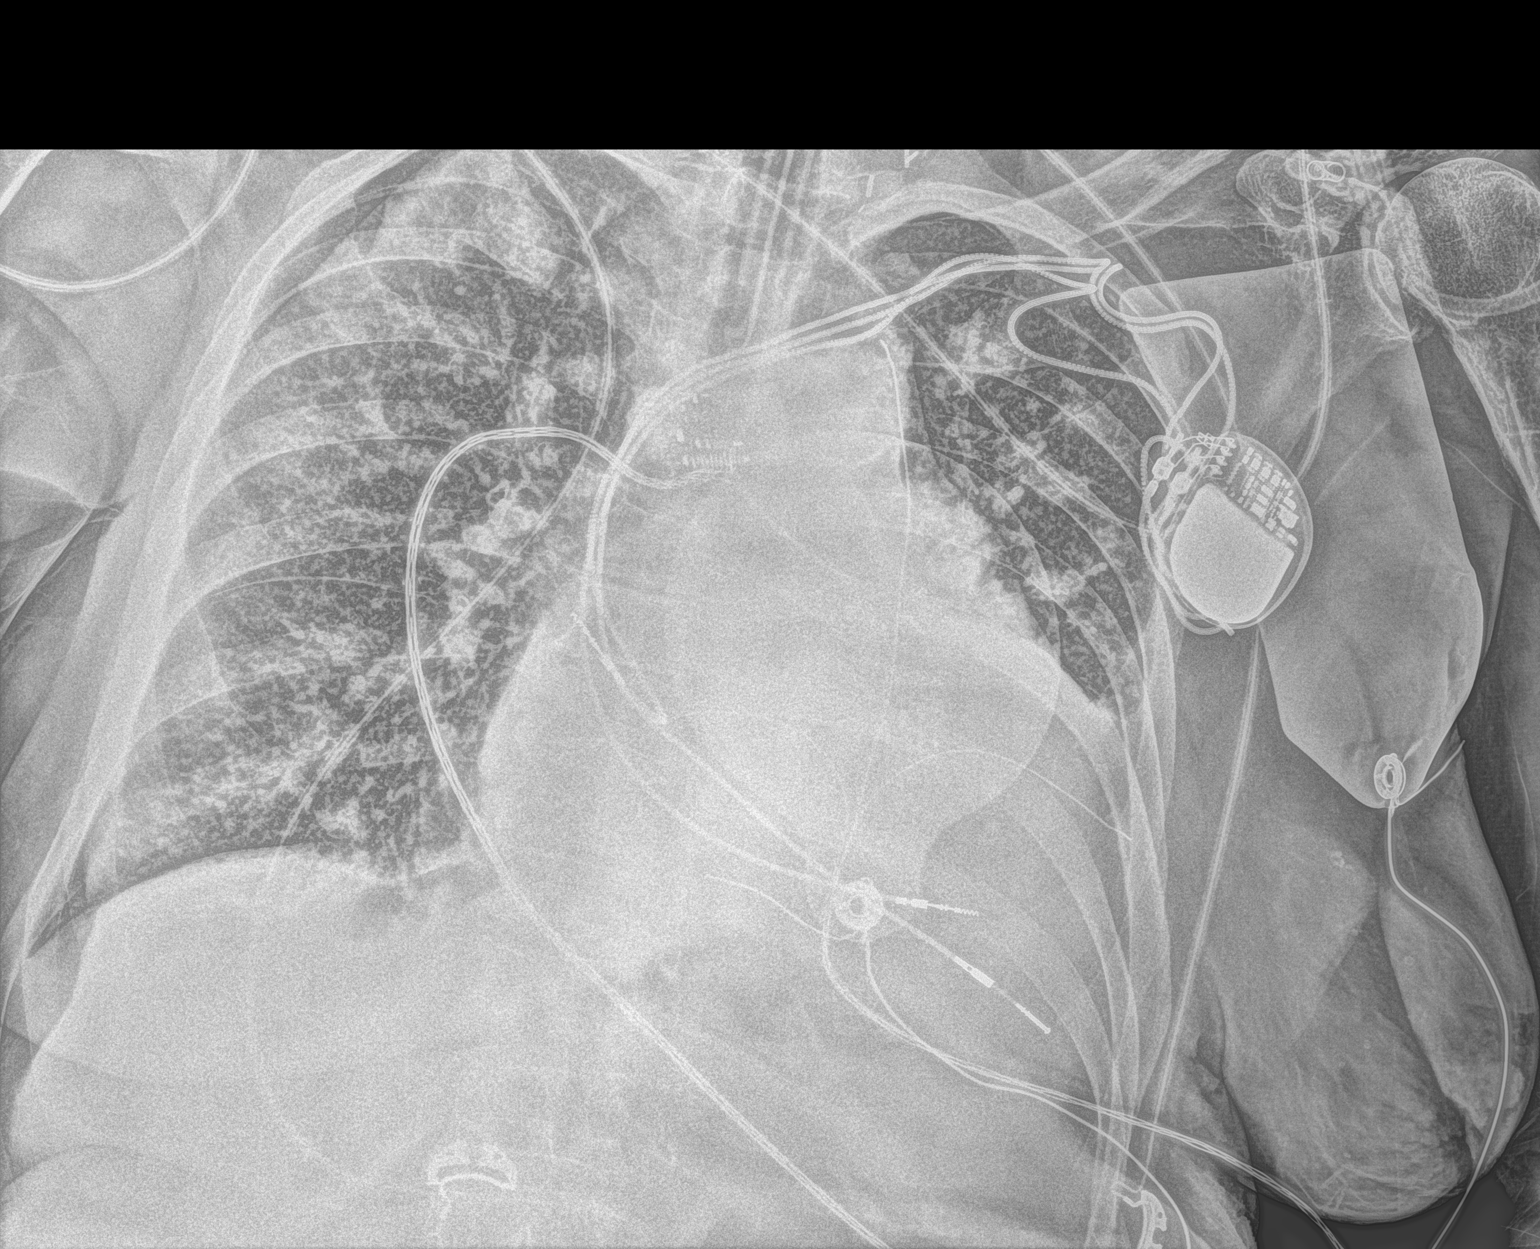

[2 of 2 positions shown; findings below may reference images not displayed]

FINDINGS: Stable cardiomegaly. Endotracheal tube is seen projected over
tracheal air shadow with distal tip 4 cm above the carina. Mild
central pulmonary vascular congestion is noted. Left-sided pacemaker
is unchanged in position. Right lung is clear. Left basilar
atelectasis or infiltrate is noted with associated pleural effusion.
Bony thorax is unremarkable.
IMPRESSION: Endotracheal tube in grossly good position. Stable cardiomegaly with
central pulmonary vascular congestion. Left basilar atelectasis or
infiltrate is noted with associated pleural effusion.
